# Patient Record
Sex: Female | Born: 1975 | Race: Black or African American | Hispanic: No | Marital: Married | Smoking: Never smoker
Health system: Southern US, Community
[De-identification: ages and names within clinical notes are randomized; demographics above are authoritative.]

## PROBLEM LIST (undated history)

## (undated) DIAGNOSIS — I10 Essential (primary) hypertension: Secondary | ICD-10-CM

## (undated) DIAGNOSIS — K219 Gastro-esophageal reflux disease without esophagitis: Secondary | ICD-10-CM

## (undated) DIAGNOSIS — D649 Anemia, unspecified: Secondary | ICD-10-CM

## (undated) DIAGNOSIS — F419 Anxiety disorder, unspecified: Secondary | ICD-10-CM

## (undated) HISTORY — DX: Essential (primary) hypertension: I10

## (undated) HISTORY — DX: Anxiety disorder, unspecified: F41.9

## (undated) HISTORY — PX: UPPER GASTROINTESTINAL ENDOSCOPY: SHX188

## (undated) HISTORY — PX: NO PAST SURGERIES: SHX2092

## (undated) HISTORY — DX: Anemia, unspecified: D64.9

---

## 1998-12-22 ENCOUNTER — Inpatient Hospital Stay (HOSPITAL_COMMUNITY): Admission: AD | Admit: 1998-12-22 | Discharge: 1998-12-22 | Payer: Self-pay | Admitting: Obstetrics

## 1998-12-22 ENCOUNTER — Emergency Department (HOSPITAL_COMMUNITY): Admission: EM | Admit: 1998-12-22 | Discharge: 1998-12-22 | Payer: Self-pay | Admitting: Emergency Medicine

## 1998-12-23 ENCOUNTER — Ambulatory Visit (HOSPITAL_COMMUNITY): Admission: RE | Admit: 1998-12-23 | Discharge: 1998-12-23 | Payer: Self-pay | Admitting: Obstetrics

## 1999-02-08 ENCOUNTER — Inpatient Hospital Stay (HOSPITAL_COMMUNITY): Admission: AD | Admit: 1999-02-08 | Discharge: 1999-02-08 | Payer: Self-pay | Admitting: *Deleted

## 1999-03-26 ENCOUNTER — Ambulatory Visit (HOSPITAL_COMMUNITY): Admission: RE | Admit: 1999-03-26 | Discharge: 1999-03-26 | Payer: Self-pay | Admitting: *Deleted

## 1999-05-05 ENCOUNTER — Other Ambulatory Visit: Admission: RE | Admit: 1999-05-05 | Discharge: 1999-05-05 | Payer: Self-pay | Admitting: *Deleted

## 1999-06-05 ENCOUNTER — Inpatient Hospital Stay (HOSPITAL_COMMUNITY): Admission: AD | Admit: 1999-06-05 | Discharge: 1999-06-05 | Payer: Self-pay | Admitting: Obstetrics & Gynecology

## 1999-07-03 ENCOUNTER — Ambulatory Visit (HOSPITAL_COMMUNITY): Admission: RE | Admit: 1999-07-03 | Discharge: 1999-07-03 | Payer: Self-pay | Admitting: *Deleted

## 1999-07-12 ENCOUNTER — Inpatient Hospital Stay (HOSPITAL_COMMUNITY): Admission: AD | Admit: 1999-07-12 | Discharge: 1999-07-12 | Payer: Self-pay | Admitting: *Deleted

## 1999-07-20 ENCOUNTER — Inpatient Hospital Stay (HOSPITAL_COMMUNITY): Admission: AD | Admit: 1999-07-20 | Discharge: 1999-07-20 | Payer: Self-pay | Admitting: Obstetrics

## 1999-07-21 ENCOUNTER — Inpatient Hospital Stay (HOSPITAL_COMMUNITY): Admission: AD | Admit: 1999-07-21 | Discharge: 1999-07-22 | Payer: Self-pay | Admitting: Obstetrics & Gynecology

## 2000-10-26 ENCOUNTER — Inpatient Hospital Stay (HOSPITAL_COMMUNITY): Admission: AD | Admit: 2000-10-26 | Discharge: 2000-10-26 | Payer: Self-pay | Admitting: Obstetrics & Gynecology

## 2000-12-10 ENCOUNTER — Inpatient Hospital Stay (HOSPITAL_COMMUNITY): Admission: AD | Admit: 2000-12-10 | Discharge: 2000-12-10 | Payer: Self-pay | Admitting: *Deleted

## 2000-12-30 ENCOUNTER — Inpatient Hospital Stay (HOSPITAL_COMMUNITY): Admission: AD | Admit: 2000-12-30 | Discharge: 2000-12-30 | Payer: Self-pay | Admitting: *Deleted

## 2001-01-28 ENCOUNTER — Ambulatory Visit (HOSPITAL_COMMUNITY): Admission: RE | Admit: 2001-01-28 | Discharge: 2001-01-28 | Payer: Self-pay | Admitting: *Deleted

## 2001-01-28 ENCOUNTER — Encounter: Payer: Self-pay | Admitting: *Deleted

## 2001-03-08 ENCOUNTER — Inpatient Hospital Stay (HOSPITAL_COMMUNITY): Admission: AD | Admit: 2001-03-08 | Discharge: 2001-03-08 | Payer: Self-pay | Admitting: *Deleted

## 2001-07-09 ENCOUNTER — Inpatient Hospital Stay (HOSPITAL_COMMUNITY): Admission: AD | Admit: 2001-07-09 | Discharge: 2001-07-11 | Payer: Self-pay | Admitting: Obstetrics and Gynecology

## 2002-01-23 ENCOUNTER — Emergency Department (HOSPITAL_COMMUNITY): Admission: EM | Admit: 2002-01-23 | Discharge: 2002-01-23 | Payer: Self-pay | Admitting: Emergency Medicine

## 2002-02-03 ENCOUNTER — Emergency Department (HOSPITAL_COMMUNITY): Admission: EM | Admit: 2002-02-03 | Discharge: 2002-02-03 | Payer: Self-pay | Admitting: Emergency Medicine

## 2002-03-13 ENCOUNTER — Emergency Department (HOSPITAL_COMMUNITY): Admission: EM | Admit: 2002-03-13 | Discharge: 2002-03-13 | Payer: Self-pay | Admitting: Emergency Medicine

## 2002-06-12 ENCOUNTER — Emergency Department (HOSPITAL_COMMUNITY): Admission: EM | Admit: 2002-06-12 | Discharge: 2002-06-12 | Payer: Self-pay | Admitting: Emergency Medicine

## 2002-07-30 ENCOUNTER — Inpatient Hospital Stay (HOSPITAL_COMMUNITY): Admission: AD | Admit: 2002-07-30 | Discharge: 2002-07-30 | Payer: Self-pay | Admitting: Family Medicine

## 2003-05-23 ENCOUNTER — Encounter (INDEPENDENT_AMBULATORY_CARE_PROVIDER_SITE_OTHER): Payer: Self-pay | Admitting: *Deleted

## 2003-05-23 LAB — CONVERTED CEMR LAB

## 2003-07-09 ENCOUNTER — Inpatient Hospital Stay (HOSPITAL_COMMUNITY): Admission: AD | Admit: 2003-07-09 | Discharge: 2003-07-09 | Payer: Self-pay | Admitting: Obstetrics & Gynecology

## 2003-07-09 ENCOUNTER — Encounter: Payer: Self-pay | Admitting: *Deleted

## 2003-07-31 ENCOUNTER — Other Ambulatory Visit: Admission: RE | Admit: 2003-07-31 | Discharge: 2003-07-31 | Payer: Self-pay | Admitting: Obstetrics and Gynecology

## 2003-12-11 ENCOUNTER — Emergency Department (HOSPITAL_COMMUNITY): Admission: EM | Admit: 2003-12-11 | Discharge: 2003-12-11 | Payer: Self-pay | Admitting: Emergency Medicine

## 2003-12-25 ENCOUNTER — Inpatient Hospital Stay (HOSPITAL_COMMUNITY): Admission: AD | Admit: 2003-12-25 | Discharge: 2003-12-25 | Payer: Self-pay | Admitting: Obstetrics and Gynecology

## 2004-01-18 ENCOUNTER — Encounter: Payer: Self-pay | Admitting: General Surgery

## 2004-01-18 ENCOUNTER — Inpatient Hospital Stay (HOSPITAL_COMMUNITY): Admission: AD | Admit: 2004-01-18 | Discharge: 2004-01-19 | Payer: Self-pay | Admitting: Obstetrics and Gynecology

## 2004-02-03 ENCOUNTER — Inpatient Hospital Stay (HOSPITAL_COMMUNITY): Admission: AD | Admit: 2004-02-03 | Discharge: 2004-02-05 | Payer: Self-pay | Admitting: Obstetrics and Gynecology

## 2004-03-06 ENCOUNTER — Other Ambulatory Visit: Admission: RE | Admit: 2004-03-06 | Discharge: 2004-03-06 | Payer: Self-pay | Admitting: Obstetrics and Gynecology

## 2004-10-27 ENCOUNTER — Emergency Department (HOSPITAL_COMMUNITY): Admission: EM | Admit: 2004-10-27 | Discharge: 2004-10-27 | Payer: Self-pay | Admitting: Emergency Medicine

## 2005-05-20 ENCOUNTER — Emergency Department (HOSPITAL_COMMUNITY): Admission: EM | Admit: 2005-05-20 | Discharge: 2005-05-20 | Payer: Self-pay | Admitting: Emergency Medicine

## 2005-06-18 ENCOUNTER — Ambulatory Visit: Payer: Self-pay | Admitting: Family Medicine

## 2005-07-28 ENCOUNTER — Ambulatory Visit: Payer: Self-pay | Admitting: Family Medicine

## 2005-07-30 ENCOUNTER — Ambulatory Visit: Payer: Self-pay | Admitting: Family Medicine

## 2005-09-09 ENCOUNTER — Inpatient Hospital Stay (HOSPITAL_COMMUNITY): Admission: AD | Admit: 2005-09-09 | Discharge: 2005-09-09 | Payer: Self-pay | Admitting: Obstetrics and Gynecology

## 2005-09-27 ENCOUNTER — Emergency Department (HOSPITAL_COMMUNITY): Admission: EM | Admit: 2005-09-27 | Discharge: 2005-09-28 | Payer: Self-pay | Admitting: Emergency Medicine

## 2006-11-03 ENCOUNTER — Emergency Department (HOSPITAL_COMMUNITY): Admission: EM | Admit: 2006-11-03 | Discharge: 2006-11-03 | Payer: Self-pay | Admitting: Family Medicine

## 2006-11-04 ENCOUNTER — Ambulatory Visit: Payer: Self-pay | Admitting: Sports Medicine

## 2006-11-18 ENCOUNTER — Encounter: Payer: Self-pay | Admitting: Family Medicine

## 2006-11-18 ENCOUNTER — Ambulatory Visit: Payer: Self-pay | Admitting: Sports Medicine

## 2006-11-18 DIAGNOSIS — E669 Obesity, unspecified: Secondary | ICD-10-CM | POA: Insufficient documentation

## 2006-11-18 DIAGNOSIS — I10 Essential (primary) hypertension: Secondary | ICD-10-CM | POA: Insufficient documentation

## 2006-11-18 LAB — CONVERTED CEMR LAB
BUN: 19 mg/dL (ref 6–23)
CO2: 27 meq/L (ref 19–32)
Calcium: 10.3 mg/dL (ref 8.4–10.5)
Chloride: 97 meq/L (ref 96–112)
Creatinine, Ser: 1.04 mg/dL (ref 0.40–1.20)
Glucose, Bld: 82 mg/dL (ref 70–99)
Potassium: 4.2 meq/L (ref 3.5–5.3)
Sodium: 137 meq/L (ref 135–145)

## 2006-11-19 ENCOUNTER — Encounter (INDEPENDENT_AMBULATORY_CARE_PROVIDER_SITE_OTHER): Payer: Self-pay | Admitting: *Deleted

## 2006-12-16 ENCOUNTER — Emergency Department (HOSPITAL_COMMUNITY): Admission: EM | Admit: 2006-12-16 | Discharge: 2006-12-16 | Payer: Self-pay | Admitting: Emergency Medicine

## 2006-12-18 ENCOUNTER — Emergency Department (HOSPITAL_COMMUNITY): Admission: EM | Admit: 2006-12-18 | Discharge: 2006-12-18 | Payer: Self-pay | Admitting: Emergency Medicine

## 2006-12-28 ENCOUNTER — Inpatient Hospital Stay (HOSPITAL_COMMUNITY): Admission: AD | Admit: 2006-12-28 | Discharge: 2006-12-28 | Payer: Self-pay | Admitting: Obstetrics and Gynecology

## 2007-02-18 ENCOUNTER — Telehealth (INDEPENDENT_AMBULATORY_CARE_PROVIDER_SITE_OTHER): Payer: Self-pay | Admitting: *Deleted

## 2007-05-02 ENCOUNTER — Telehealth (INDEPENDENT_AMBULATORY_CARE_PROVIDER_SITE_OTHER): Payer: Self-pay | Admitting: *Deleted

## 2007-06-03 ENCOUNTER — Other Ambulatory Visit: Admission: RE | Admit: 2007-06-03 | Discharge: 2007-06-03 | Payer: Self-pay | Admitting: *Deleted

## 2007-06-03 ENCOUNTER — Ambulatory Visit: Payer: Self-pay | Admitting: Family Medicine

## 2007-06-03 ENCOUNTER — Encounter (INDEPENDENT_AMBULATORY_CARE_PROVIDER_SITE_OTHER): Payer: Self-pay | Admitting: *Deleted

## 2007-06-03 LAB — CONVERTED CEMR LAB
Chlamydia, DNA Probe: NEGATIVE
GC Probe Amp, Genital: NEGATIVE
TSH: 5.227 microintl units/mL (ref 0.350–5.50)

## 2007-06-07 ENCOUNTER — Encounter (INDEPENDENT_AMBULATORY_CARE_PROVIDER_SITE_OTHER): Payer: Self-pay | Admitting: *Deleted

## 2007-06-14 ENCOUNTER — Encounter (INDEPENDENT_AMBULATORY_CARE_PROVIDER_SITE_OTHER): Payer: Self-pay | Admitting: *Deleted

## 2007-10-27 ENCOUNTER — Emergency Department (HOSPITAL_COMMUNITY): Admission: EM | Admit: 2007-10-27 | Discharge: 2007-10-27 | Payer: Self-pay | Admitting: Emergency Medicine

## 2008-01-23 ENCOUNTER — Emergency Department (HOSPITAL_COMMUNITY): Admission: EM | Admit: 2008-01-23 | Discharge: 2008-01-23 | Payer: Self-pay | Admitting: Emergency Medicine

## 2008-06-19 ENCOUNTER — Emergency Department (HOSPITAL_COMMUNITY): Admission: EM | Admit: 2008-06-19 | Discharge: 2008-06-19 | Payer: Self-pay | Admitting: Emergency Medicine

## 2008-07-27 ENCOUNTER — Encounter: Payer: Self-pay | Admitting: *Deleted

## 2008-08-23 ENCOUNTER — Encounter: Payer: Self-pay | Admitting: *Deleted

## 2008-08-28 ENCOUNTER — Telehealth: Payer: Self-pay | Admitting: *Deleted

## 2008-09-04 ENCOUNTER — Ambulatory Visit: Payer: Self-pay | Admitting: Family Medicine

## 2008-09-04 ENCOUNTER — Telehealth: Payer: Self-pay | Admitting: *Deleted

## 2008-09-04 ENCOUNTER — Encounter: Payer: Self-pay | Admitting: Family Medicine

## 2008-09-04 DIAGNOSIS — K219 Gastro-esophageal reflux disease without esophagitis: Secondary | ICD-10-CM | POA: Insufficient documentation

## 2008-09-04 LAB — CONVERTED CEMR LAB
ALT: 14 units/L (ref 0–35)
AST: 13 units/L (ref 0–37)
Albumin: 4.1 g/dL (ref 3.5–5.2)
Alkaline Phosphatase: 44 units/L (ref 39–117)
BUN: 17 mg/dL (ref 6–23)
Basophils Absolute: 0.1 10*3/uL (ref 0.0–0.1)
Basophils Relative: 1 % (ref 0–1)
CO2: 23 meq/L (ref 19–32)
Calcium: 9.3 mg/dL (ref 8.4–10.5)
Chloride: 105 meq/L (ref 96–112)
Cholesterol: 115 mg/dL (ref 0–200)
Creatinine, Ser: 0.83 mg/dL (ref 0.40–1.20)
Eosinophils Absolute: 0.1 10*3/uL (ref 0.0–0.7)
Eosinophils Relative: 2 % (ref 0–5)
Glucose, Bld: 101 mg/dL — ABNORMAL HIGH (ref 70–99)
H Pylori IgG: NEGATIVE
HCT: 32.2 % — ABNORMAL LOW (ref 36.0–46.0)
HDL: 44 mg/dL (ref >39)
Hemoglobin: 10.3 g/dL — ABNORMAL LOW (ref 12.0–15.0)
LDL Cholesterol: 62 mg/dL (ref 0–99)
Lymphocytes Relative: 40 % (ref 12–46)
Lymphs Abs: 2 10*3/uL (ref 0.7–4.0)
MCHC: 32 g/dL (ref 30.0–36.0)
MCV: 89.9 fL (ref 78.0–100.0)
Monocytes Absolute: 0.3 10*3/uL (ref 0.1–1.0)
Monocytes Relative: 6 % (ref 3–12)
Neutro Abs: 2.6 10*3/uL (ref 1.7–7.7)
Neutrophils Relative %: 51 % (ref 43–77)
Platelets: 232 10*3/uL (ref 150–400)
Potassium: 3.6 meq/L (ref 3.5–5.3)
RBC: 3.58 M/uL — ABNORMAL LOW (ref 3.87–5.11)
RDW: 13.4 % (ref 11.5–15.5)
Sodium: 139 meq/L (ref 135–145)
Total Bilirubin: 0.3 mg/dL (ref 0.3–1.2)
Total CHOL/HDL Ratio: 2.6
Total Protein: 7.2 g/dL (ref 6.0–8.3)
Triglycerides: 47 mg/dL (ref <150)
VLDL: 9 mg/dL (ref 0–40)
WBC: 5.1 10*3/uL (ref 4.0–10.5)

## 2008-09-07 ENCOUNTER — Telehealth: Payer: Self-pay | Admitting: Family Medicine

## 2008-09-07 DIAGNOSIS — D649 Anemia, unspecified: Secondary | ICD-10-CM

## 2008-09-07 HISTORY — DX: Anemia, unspecified: D64.9

## 2009-06-18 ENCOUNTER — Emergency Department (HOSPITAL_COMMUNITY): Admission: EM | Admit: 2009-06-18 | Discharge: 2009-06-18 | Payer: Self-pay | Admitting: Emergency Medicine

## 2009-10-23 ENCOUNTER — Emergency Department (HOSPITAL_COMMUNITY): Admission: EM | Admit: 2009-10-23 | Discharge: 2009-10-23 | Payer: Self-pay | Admitting: Emergency Medicine

## 2010-01-20 ENCOUNTER — Ambulatory Visit: Payer: Self-pay | Admitting: Family Medicine

## 2010-01-20 ENCOUNTER — Encounter: Payer: Self-pay | Admitting: Family Medicine

## 2010-01-21 ENCOUNTER — Telehealth: Payer: Self-pay | Admitting: Family Medicine

## 2010-01-23 ENCOUNTER — Telehealth: Payer: Self-pay | Admitting: Sports Medicine

## 2010-01-24 ENCOUNTER — Ambulatory Visit: Payer: Self-pay | Admitting: Family Medicine

## 2010-02-19 ENCOUNTER — Inpatient Hospital Stay (HOSPITAL_COMMUNITY): Admission: AD | Admit: 2010-02-19 | Discharge: 2010-02-19 | Payer: Self-pay | Admitting: Obstetrics & Gynecology

## 2010-03-13 ENCOUNTER — Inpatient Hospital Stay (HOSPITAL_COMMUNITY): Admission: AD | Admit: 2010-03-13 | Discharge: 2010-03-13 | Payer: Self-pay | Admitting: Obstetrics & Gynecology

## 2010-03-13 ENCOUNTER — Ambulatory Visit: Payer: Self-pay | Admitting: Gynecology

## 2010-03-27 ENCOUNTER — Telehealth (INDEPENDENT_AMBULATORY_CARE_PROVIDER_SITE_OTHER): Payer: Self-pay | Admitting: *Deleted

## 2010-04-07 ENCOUNTER — Ambulatory Visit: Payer: Self-pay | Admitting: Family Medicine

## 2010-04-07 ENCOUNTER — Encounter: Payer: Self-pay | Admitting: Family Medicine

## 2010-04-07 LAB — CONVERTED CEMR LAB
ALT: 10 units/L (ref 0–35)
AST: 13 units/L (ref 0–37)
Albumin: 4.2 g/dL (ref 3.5–5.2)
Alkaline Phosphatase: 51 units/L (ref 39–117)
BUN: 12 mg/dL (ref 6–23)
CO2: 25 meq/L (ref 19–32)
Calcium: 10 mg/dL (ref 8.4–10.5)
Chloride: 99 meq/L (ref 96–112)
Creatinine, Ser: 0.92 mg/dL (ref 0.40–1.20)
Glucose, Bld: 82 mg/dL (ref 70–99)
HCT: 35.6 % — ABNORMAL LOW (ref 36.0–46.0)
Hemoglobin: 11.5 g/dL — ABNORMAL LOW (ref 12.0–15.0)
MCHC: 32.3 g/dL (ref 30.0–36.0)
MCV: 89.2 fL (ref 78.0–100.0)
Platelets: 291 10*3/uL (ref 150–400)
Potassium: 3.5 meq/L (ref 3.5–5.3)
RBC: 3.99 M/uL (ref 3.87–5.11)
RDW: 14.5 % (ref 11.5–15.5)
Sodium: 139 meq/L (ref 135–145)
Total Bilirubin: 0.4 mg/dL (ref 0.3–1.2)
Total Protein: 8.2 g/dL (ref 6.0–8.3)
WBC: 7.1 10*3/uL (ref 4.0–10.5)

## 2010-04-10 ENCOUNTER — Encounter: Payer: Self-pay | Admitting: Family Medicine

## 2010-04-29 ENCOUNTER — Emergency Department (HOSPITAL_COMMUNITY): Admission: EM | Admit: 2010-04-29 | Discharge: 2010-04-29 | Payer: Self-pay | Admitting: Family Medicine

## 2010-09-21 NOTE — L&D Delivery Note (Signed)
Delivery Note At 8:01 AM a viable female was delivered via Vaginal, Spontaneous Delivery, loose nuchal cord X 2. reduced  ).  APGAR: 8, 9; weight 6 lb 13.3 oz (3099 g).   Placenta status: Intact, Spontaneous.  Cord: 3 vessels with the following complications: None.  Cord pH:   Anesthesia: None  Episiotomy: None Lacerations:  Suture Repair:  Est. Blood Loss <500(mL):   Mom to postpartum.  Baby to nursery-stable.  CRESENZO-DISHMAN,Aiza Vollrath 04/17/2011, 8:51 AM

## 2010-09-23 ENCOUNTER — Emergency Department (HOSPITAL_COMMUNITY)
Admission: EM | Admit: 2010-09-23 | Discharge: 2010-09-23 | Payer: Self-pay | Source: Home / Self Care | Admitting: Family Medicine

## 2010-09-29 ENCOUNTER — Inpatient Hospital Stay (HOSPITAL_COMMUNITY)
Admission: AD | Admit: 2010-09-29 | Discharge: 2010-09-29 | Payer: Self-pay | Source: Home / Self Care | Attending: Obstetrics and Gynecology | Admitting: Obstetrics and Gynecology

## 2010-10-06 LAB — ABO/RH: ABO/RH(D): O POS

## 2010-10-06 LAB — WET PREP, GENITAL
Trich, Wet Prep: NONE SEEN
Yeast Wet Prep HPF POC: NONE SEEN

## 2010-10-06 LAB — CBC
HCT: 33.2 % — ABNORMAL LOW (ref 36.0–46.0)
Hemoglobin: 11.1 g/dL — ABNORMAL LOW (ref 12.0–15.0)
MCH: 28.8 pg (ref 26.0–34.0)
MCHC: 33.4 g/dL (ref 30.0–36.0)
MCV: 86 fL (ref 78.0–100.0)
Platelets: 225 10*3/uL (ref 150–400)
RBC: 3.86 MIL/uL — ABNORMAL LOW (ref 3.87–5.11)
RDW: 13.6 % (ref 11.5–15.5)
WBC: 5.4 10*3/uL (ref 4.0–10.5)

## 2010-10-06 LAB — HCG, QUANTITATIVE, PREGNANCY: hCG, Beta Chain, Quant, S: 96573 m[IU]/mL — ABNORMAL HIGH (ref <5)

## 2010-10-06 LAB — GC/CHLAMYDIA PROBE AMP, GENITAL
Chlamydia, DNA Probe: NEGATIVE
GC Probe Amp, Genital: NEGATIVE

## 2010-10-21 NOTE — Progress Notes (Signed)
Summary: Rx Prob  Phone Note Call from Patient Call back at [phone]   Caller: Patient Summary of Call: Pt says that she was trying to get her bp meds filled, but the pharmacy is out and it will be about a week before they get any.  Is there something else that can be sent in for her?  Pharmacy is [facility] [address] Initial call taken by: Clydell Hakim,  March 27, 2010 4:00 PM  Follow-up for Phone Call        spoke with patient and  then pharmacy. It is the generic  maxizide that pharmacy is out of.  pharmacist states it has been back ordered for a month or so.  patient has been out of this for a week . will send message to MD to please advise. Follow-up by: Theresia Lo RN,  March 27, 2010 4:57 PM  Additional Follow-up for Phone Call Additional follow up Details #1::        Dr. Earnest Bailey paged and she will address the  medication problem . Additional Follow-up by: Theresia Lo RN,  March 27, 2010 5:22 PM    Additional Follow-up for Phone Call Additional follow up Details #2::    I have sent prescription.  Patient overdue for follow-up appt.  Please make follow-up. Follow-up by: Delbert Harness MD,  March 27, 2010 10:19 PM  Additional Follow-up for Phone Call Additional follow up Details #3:: Details for Additional Follow-up Action Taken: patient notified and  apppointment is scheduled forn 04/07/2010 at 4:00 PM.   New/Updated Medications: HYDROCHLOROTHIAZIDE 25 MG TABS (HYDROCHLOROTHIAZIDE) take one tablet daily (use until maxide available) Prescriptions: HYDROCHLOROTHIAZIDE 25 MG TABS (HYDROCHLOROTHIAZIDE) take one tablet daily (use until maxide available)  #30 x 0   Entered and Authorized by:   Delbert Harness MD   Signed by:   Delbert Harness MD on 03/27/2010   Method used:   Electronically to        [facility] [phone]* (retail)       [address]       [place], [place]  [address]       Ph: [phone]       Fax: [phone]   RxID:   [id number]

## 2010-10-21 NOTE — Progress Notes (Signed)
Summary: Rx Req  Phone Note Refill Request Call back at [phone] Message from:  Patient  Refills Requested: Medication #1:  NORVASC 5 MG TABS Take 1 tablet by mouth once a day.  Pt needs appointment  Medication #2:  MAXZIDE-25 37.5-25 MG TABS Take 1 tablet by mouth once a day [facility]  Initial call taken by: Clydell Hakim,  Jan 21, 2010 11:38 AM  Follow-up for Phone Call        to pcp Follow-up by: Theresia Lo RN,  Jan 21, 2010 12:00 PM    Prescriptions: NORVASC 5 MG TABS (AMLODIPINE BESYLATE) Take 1 tablet by mouth once a day.  Pt needs appointment  #30 x 3   Entered and Authorized by:   Angelena Sole MD   Signed by:   Angelena Sole MD on 01/21/2010   Method used:   Electronically to        [facility] [phone]* (retail)       [address]       [place], [place]  [address]       Ph: [phone]       Fax: [phone]   RxID:   [id number] MAXZIDE-25 37.5-25 MG TABS (TRIAMTERENE-HCTZ) Take 1 tablet by mouth once a day  #30 x 3   Entered and Authorized by:   Angelena Sole MD   Signed by:   Angelena Sole MD on 01/21/2010   Method used:   Electronically to        [facility] [phone]* (retail)       [address]       [place], [place]  [address]       Ph: [phone]       Fax: [phone]   RxID:   [id number]

## 2010-10-21 NOTE — Miscellaneous (Signed)
Summary: Consent IUD Removal  Consent IUD Removal   Imported By: Clydell Hakim 01/29/2010 13:52:35  _____________________________________________________________________  External Attachment:    Type:   Image     Comment:   External Document

## 2010-10-21 NOTE — Progress Notes (Signed)
Summary: triage  Phone Note Call from Patient Call back at [phone]   Caller: Patient Summary of Call: pt states that her BP meds is not working - she is having Headaches and bp is still high Initial call taken by: De Nurse,  Jan 23, 2010 2:21 PM  Follow-up for Phone Call        LM Follow-up by: Golden Circle RN,  Jan 23, 2010 2:34 PM  Additional Follow-up for Phone Call Additional follow up Details #1::        pt returned call Additional Follow-up by: De Nurse,  Jan 23, 2010 2:37 PM    Additional Follow-up for Phone Call Additional follow up Details #2::    has been out of bp meds x 3 wks or more. just restarted them. c/o elevated readings 152/111, 168/118 & a HA that will not quit. tylenol did not help. asa helped some. "trying to stay away from salt" asked her to come in now so we can check her bp. if elevated by our machine will see her. she agreed with this plan Follow-up by: Golden Circle RN,  Jan 23, 2010 2:45 PM  Additional Follow-up for Phone Call Additional follow up Details #3:: Details for Additional Follow-up Action Taken: 138/108. states she has been very nervous about this. does not want to come in now. advised low sodium foods & relax. she does not work. told her we have a doctor on call when we are closed. to continue asa if it helps the HA. she said she would call us back if it got worse. Additional Follow-up by: Golden Circle RN,  Jan 23, 2010 3:24 PM   Good, should be seen tomo for this. Rodney Langton MD  Jan 23, 2010 4:19 PM   she will be here between 10 & 10:30 this am. states she feels a lot better.Golden Circle RN  Jan 24, 2010 8:35 AM  Noted. Rodney Langton MD  Jan 24, 2010 9:49 AM

## 2010-10-21 NOTE — Letter (Signed)
Summary: Results Follow-up Letter  [facility]  [address]   [place], [place] [address]   Phone: [phone]  Fax: [phone]    04/10/2010  [address] [place], [place]  [address]  Dear Tamara Church,   The following are the results of your recent test(s):  Your potassium level looked fine today.  Your hemoglobin is mildy low showing you have some anemia.  Please take iron tablets (ferrous sulfate 325 mg) twice a day and we will recheck and discuss at your next visit.   Sincerely,  Delbert Harness MD [facility]           Appended Document: Results Follow-up Letter mailed

## 2010-10-21 NOTE — Assessment & Plan Note (Signed)
Summary: removal of iud/bp ck,tcb   Vital Signs:  Patient profile:   35 year old female Weight:      200.6 pounds BMI:     35.66 Temp:     98.3 degrees F oral Pulse rate:   82 / minute Pulse rhythm:   regular BP sitting:   155 / 109  (right arm) Cuff size:   large  Vitals Entered By: Loralee Pacas CMA (Jan 20, 2010 1:40 PM)  Serial Vital Signs/Assessments:  Time      Position  BP       Pulse  Resp  Temp     By 1:59 PM             158/118                        Loralee Pacas CMA  CC: blood pressure  Comments pt ran out of her bp meds and was told that she could not get refill until she has been seen   Primary Care Provider:  Delbert Harness MD  CC:  blood pressure .  History of Present Illness: HTN- out of meds >3 weeks. denies chest pain, SOB, peripheral edema, blurred vision. occasional headache. previously on triamterene-hctz and amlodipine.   family planning- has "plastic IUD." was placed 6 years ago (per patient). desires removal.   Current Medications (verified): 1)  None  Allergies (verified): No Known Drug Allergies  Past History:  Past Medical History: G5P5005 all FT all vaginal, IUD placed May 2005, No history of abnormal paps or STDs  Physical Exam  General:  obese female, NAD. vitals reviewed.  Eyes:  vision grossly normal Mouth:  MMM Neck:  No deformities, masses, or tenderness noted. no JVD Lungs:  Normal respiratory effort, chest expands symmetrically. Lungs are clear to auscultation, no crackles or wheezes. Heart:  Normal rate and regular rhythm. S1 and S2 normal without gallop, murmur, click, rub or other extra sounds. Abdomen:  obese NT, ND, +BS Genitalia:  Normal introitus for age, no external lesions, no vaginal discharge, mucosa pink and moist, no vaginal or cervical lesions, no vaginal atrophy, no friaility or hemorrhage, normal uterus size and position, no adnexal masses or tenderness.  IUD REMOVAL Consent signed. IUD strings visualized and  grasped with ring forceps. Gentle traction was applied. IUD removed without difficulty.  Pulses:  R and L carotid,radial,dorsalis pedis and posterior tibial pulses are full and equal bilaterally Extremities:  no edema of BLE Neurologic:  alert & oriented X3 and cranial nerves II-XII intact.     Impression & Recommendations:  Problem # 1:  CONTRACEPTIVE MANAGEMENT (ICD-V25.09) Assessment Unchanged IUD removed. patient declined depo at this time, but plans on resuming in future. not good candidate for estrogen containing forms of birth control given uncontrolled HTN  Problem # 2:  HYPERTENSION, BENIGN SYSTEMIC (ICD-401.1) Assessment: Deteriorated  refills for medications sent. f/u in 3-4 weeks via phone with home BP measurements. patient to check 2x weekly.  Her updated medication list for this problem includes:    Maxzide-25 37.5-25 Mg Tabs (Triamterene-hctz) .Marland Kitchen... Take 1 tablet by mouth once a day    Norvasc 5 Mg Tabs (Amlodipine besylate) .Marland Kitchen... Take 1 tablet by mouth once a day.  pt needs appointment  Orders: [facility]- Est  Level 4 ([address])  Other Orders: IUD removal -[facility] ([address])   Prevention & Chronic Care Immunizations   Influenza vaccine: Not documented    Tetanus booster: 09/04/2008: Tdap  Pneumococcal vaccine: Not documented  Other Screening   Pap smear: Done.  (05/23/2003)   Pap smear due: 05/22/2004   Smoking status: never  (09/04/2008)  Hypertension   Last Blood Pressure: 155 / 109  (01/20/2010)   Serum creatinine: 0.83  (09/04/2008)   Serum potassium 3.6  (09/04/2008)    Hypertension flowsheet reviewed?: Yes   Progress toward BP goal: Deteriorated  Self-Management Support :   Personal Goals (by the next clinic visit) :      Personal blood pressure goal: 140/90  (01/20/2010)   Patient will work on the following items until the next clinic visit to reach self-care goals:     Medications and monitoring: take my medicines every day, check my blood pressure   (01/20/2010)     Eating: eat foods that are low in salt  (01/20/2010)     Activity: take a 30 minute walk every day  (01/20/2010)    Hypertension self-management support: BP self-monitoring log, Written self-care plan  (01/20/2010)   Hypertension self-care plan printed.

## 2010-10-21 NOTE — Assessment & Plan Note (Signed)
Summary: bp check-see notes//Tamara Church   Vital Signs:  Patient profile:   35 year old female Height:      63 inches Weight:      197.4 pounds BMI:     35.09 Temp:     98.6 degrees F Pulse rate:   98 / minute BP sitting:   132 / 102  (left arm)  Vitals Entered By: Theresia Lo RN (Jan 24, 2010 10:25 AM)  Serial Vital Signs/Assessments:  Comments: 10:34 AM BP checked manually using large adult cuff RA also 130/100 By: Theresia Lo RN   CC: elevated BP see notes Is Patient Diabetic? No   Primary Care Provider:  Delbert Harness MD  CC:  elevated BP see notes.  History of Present Illness: 35 yo female with elevated BP noted on last visit.  Had been out of BP meds for at least 3 weeks and was having daily headaches.  Today has been back on BP meds (confirmed with medlist today) and is no longer having headaches.  Also denies chest pain, dyspnea, LE edema, focal weakness, numbness.  Habits & Providers  Alcohol-Tobacco-Diet     Tobacco Status: never  Current Medications (verified): 1)  Maxzide-25 37.5-25 Mg Tabs (Triamterene-Hctz) .... Take 1 Tablet By Mouth Once A Day 2)  Norvasc 5 Mg Tabs (Amlodipine Besylate) .... Take 1 Tablet By Mouth Once A Day.  Pt Needs Appointment 3)  Omeprazole 40 Mg Cpdr (Omeprazole) .... Take One Tablet Daily  Allergies (verified): No Known Drug Allergies  Review of Systems       Per HPI.  Physical Exam  Additional Exam:  VITALS:  Reviewed, hypertensive GEN: Alert & oriented, no acute distress NECK: Midline trachea, no masses/thyromegaly, no cervical lymphadenopathy CARDIO: Regular rate and rhythm, no murmurs/rubs/gallops, 2+ bilateral radial pulses RESP: Clear to auscultation, normal work of breathing, no retractions/accessory muscle use EXT: Nontender, no edema    Impression & Recommendations:  Problem # 1:  HYPERTENSION, BENIGN SYSTEMIC (ICD-401.1) Assessment Deteriorated Medication nonadherence.  BP coming down now that on BP  meds, but still elevated DBP.  Recheck in nurse clinic next week, f/u w/ Dr. Karie Schwalbe in 2 weeks (PCP is on maternity leave).  Would consider increasing Norvasc to 10 mg if still not at goal.  Discussed exercise and low salt diet today.  If she is able to implement these, would also discuss DASH diet with patient. Her updated medication list for this problem includes:    Maxzide-25 37.5-25 Mg Tabs (Triamterene-hctz) .Marland Kitchen... Take 1 tablet by mouth once a day    Norvasc 5 Mg Tabs (Amlodipine besylate) .Marland Kitchen... Take 1 tablet by mouth once a day.  pt needs appointment  Orders: [facility]- Est Level  3 ([id number])  BP today: 132/102 Prior BP: 155/109 (01/20/2010)  Labs Reviewed: K+: 3.6 (09/04/2008) Creat: : 0.83 (09/04/2008)   Chol: 115 (09/04/2008)   HDL: 44 (09/04/2008)   LDL: 62 (09/04/2008)   TG: 47 (09/04/2008)  Complete Medication List: 1)  Maxzide-25 37.5-25 Mg Tabs (Triamterene-hctz) .... Take 1 tablet by mouth once a day 2)  Norvasc 5 Mg Tabs (Amlodipine besylate) .... Take 1 tablet by mouth once a day.  pt needs appointment 3)  Omeprazole 40 Mg Cpdr (Omeprazole) .... Take one tablet daily  Patient Instructions: 1)  Pleasure to meet you today. 2)  High blood pressure puts you at risk for stroke, kidney disease, and heart disease. 3)  Exercise 30 minutes, 5 days per week. 4)  Watch the salt in  your diet--see attached handout. 5)  Keep taking your blood pressure medicines. 6)  Please schedule a follow-up appointment in 2 weeks with Dr. Benjamin Stain for high blood pressure follow-up. 7)  Please schedule a NURSE VISIT in 1 week to check blood pressure.

## 2010-10-21 NOTE — Assessment & Plan Note (Signed)
Summary: BP follow up/ls   Vital Signs:  Patient profile:   35 year old female Weight:      196 pounds Temp:     98.1 degrees F oral Pulse rate:   76 / minute Pulse rhythm:   regular BP sitting:   122 / 81  (left arm) Cuff size:   large  Vitals Entered By: Loralee Pacas CMA (April 07, 2010 3:46 PM) CC: blood pressure   Primary Care Provider:  Delbert Harness MD  CC:  blood pressure.  History of Present Illness: 35 yo HTN here for BP check  HYPERTENSION Meds: Taking and tolerating? Recently switched from triamteren/hctz to HCTZ due to low supply and inability to get meds.  Currently on HCTZ 25 and Amlodipine 5 with good BP control. Home BP's: no Chest Pain: no Dyspnea: no     Current Medications (verified): 1)  Maxzide-25 37.5-25 Mg Tabs (Triamterene-Hctz) .... Take 1 Tablet By Mouth Once A Day 2)  Norvasc 5 Mg Tabs (Amlodipine Besylate) .... Take 1 Tablet By Mouth Once A Day.  Pt Needs Appointment 3)  Omeprazole 40 Mg Cpdr (Omeprazole) .... Take One Tablet Daily 4)  Hydrochlorothiazide 25 Mg Tabs (Hydrochlorothiazide) .... Take One Tablet Daily (Use Until Maxide Available)  Allergies (verified): No Known Drug Allergies PMH-FH-SH reviewed-no changes except otherwise noted  Review of Systems      See HPI General:  Denies fatigue and weight loss. CV:  Denies chest pain or discomfort, fatigue, lightheadness, and shortness of breath with exertion. Resp:  Denies shortness of breath.  Physical Exam  General:  Well-developed,well-nourished,in no acute distress; alert,appropriate and cooperative throughout examination Lungs:  Normal respiratory effort, chest expands symmetrically. Lungs are clear to auscultation, no crackles or wheezes. Heart:  Normal rate and regular rhythm. S1 and S2 normal without gallop, murmur, click, rub or other extra sounds. Extremities:  no LE edema   Impression & Recommendations:  Problem # 1:  HYPERTENSION, BENIGN SYSTEMIC (ICD-401.1) Will  check K.  Per patient she has a history of low K on HCTZ alone.  Will check K today. If ok, will keep same regimen.  If low, will d/c hctz to prevent from taking K supplement and increase amlodipine.  She asked me to call her cell as noted in instructions.  Her updated medication list for this problem includes:    Maxzide-25 37.5-25 Mg Tabs (Triamterene-hctz) .Marland Kitchen... Take 1 tablet by mouth once a day    Norvasc 5 Mg Tabs (Amlodipine besylate) .Marland Kitchen... Take 1 tablet by mouth once a day.  pt needs appointment    Hydrochlorothiazide 25 Mg Tabs (Hydrochlorothiazide) .Marland Kitchen... Take one tablet daily (use until maxide available)  Orders: Comp Met-[facility] ([id number]) [facility]- Est Level  3 ([id number])  Problem # 2:  OBESITY, NOS (ICD-278.00) Given info on healthy nutrition.  Will adress further at next visit.  Orders: Comp [facility] [phone])  Ht: 63 (01/24/2010)   Wt: 196 (04/07/2010)   BMI: 35.09 (01/24/2010)  Problem # 3:  Preventive Health Care (ICD-V70.0) not currently insured but states she is working on [employer].  She is overdue for pap and needs to further discuss contraception.  Complete Medication List: 1)  Maxzide-25 37.5-25 Mg Tabs (Triamterene-hctz) .... Take 1 tablet by mouth once a day 2)  Norvasc 5 Mg Tabs (Amlodipine besylate) .... Take 1 tablet by mouth once a day.  pt needs appointment 3)  Omeprazole 40 Mg Cpdr (Omeprazole) .... Take one tablet daily 4)  Hydrochlorothiazide 25 Mg Tabs (Hydrochlorothiazide) .Marland KitchenMarland KitchenMarland Kitchen  Take one tablet daily (use until maxide available)  Other Orders: [facility] ([address])  Patient Instructions: 1)  WIll check blood work today.  I will call you at [phone] to discuss your lab work and if we need to change medicines. 2)  Take a daily multivitamin (any brand is ok) 3)  Consider birth control 4)  See handout on things you can do to help control your blood pressure 5)  You are due for your annual gynecological exam and pap smear 6)  We need to get fasting  bloodwork (cholesterol fasting sugar)   Prevention & Chronic Care Immunizations   Influenza vaccine: Not documented    Tetanus booster: 09/04/2008: Tdap    Pneumococcal vaccine: Not documented  Other Screening   Pap smear: Done.  (05/23/2003)   Pap smear due: 05/22/2004   Smoking status: never  (01/24/2010)  Hypertension   Last Blood Pressure: 122 / 81  (04/07/2010)   Serum creatinine: 0.83  (09/04/2008)   Serum potassium 3.6  (09/04/2008) CMP ordered     Hypertension flowsheet reviewed?: Yes   Progress toward BP goal: At goal  Self-Management Support :   Personal Goals (by the next clinic visit) :      Personal blood pressure goal: 140/90  (01/20/2010)   Patient will work on the following items until the next clinic visit to reach self-care goals:     Medications and monitoring: take my medicines every day, check my blood pressure  (04/07/2010)     Eating: drink diet soda or water instead of juice or soda, eat more vegetables, eat foods that are low in salt, eat baked foods instead of fried foods, limit or avoid alcohol  (04/07/2010)     Activity: take a 30 minute walk every day  (04/07/2010)    Hypertension self-management support: BP self-monitoring log, Written self-care plan  (04/07/2010)   Hypertension self-care plan printed.

## 2010-10-27 ENCOUNTER — Telehealth: Payer: Self-pay | Admitting: *Deleted

## 2010-10-28 ENCOUNTER — Other Ambulatory Visit: Payer: Self-pay

## 2010-10-29 ENCOUNTER — Encounter: Payer: Self-pay | Admitting: Family Medicine

## 2010-10-30 ENCOUNTER — Other Ambulatory Visit: Payer: Self-pay

## 2010-10-30 ENCOUNTER — Other Ambulatory Visit: Payer: Self-pay | Admitting: Family Medicine

## 2010-10-30 DIAGNOSIS — Z3480 Encounter for supervision of other normal pregnancy, unspecified trimester: Secondary | ICD-10-CM

## 2010-10-31 LAB — HIV ANTIBODY (ROUTINE TESTING W REFLEX): HIV: NONREACTIVE

## 2010-10-31 LAB — OBSTETRIC PANEL
Antibody Screen: NEGATIVE
Basophils Absolute: 0 10*3/uL (ref 0.0–0.1)
Basophils Relative: 0 % (ref 0–1)
Eosinophils Absolute: 0.1 10*3/uL (ref 0.0–0.7)
Eosinophils Relative: 1 % (ref 0–5)
HCT: 32.1 % — ABNORMAL LOW (ref 36.0–46.0)
Hemoglobin: 10.5 g/dL — ABNORMAL LOW (ref 12.0–15.0)
Hepatitis B Surface Ag: NEGATIVE
Lymphocytes Relative: 22 % (ref 12–46)
Lymphs Abs: 1.6 10*3/uL (ref 0.7–4.0)
MCH: 28.4 pg (ref 26.0–34.0)
MCHC: 32.7 g/dL (ref 30.0–36.0)
MCV: 86.8 fL (ref 78.0–100.0)
Monocytes Absolute: 0.5 10*3/uL (ref 0.1–1.0)
Monocytes Relative: 7 % (ref 3–12)
Neutro Abs: 5 10*3/uL (ref 1.7–7.7)
Neutrophils Relative %: 70 % (ref 43–77)
Platelets: 212 10*3/uL (ref 150–400)
RBC: 3.7 MIL/uL — ABNORMAL LOW (ref 3.87–5.11)
RDW: 13.3 % (ref 11.5–15.5)
Rh Type: POSITIVE
Rubella: 7.5 IU/mL — ABNORMAL HIGH
WBC: 7.2 10*3/uL (ref 4.0–10.5)

## 2010-10-31 LAB — SICKLE CELL SCREEN: Sickle Cell Screen: NEGATIVE

## 2010-11-01 LAB — CULTURE, OB URINE
Colony Count: NO GROWTH
Organism ID, Bacteria: NO GROWTH

## 2010-11-03 ENCOUNTER — Encounter: Payer: Self-pay | Admitting: *Deleted

## 2010-11-06 NOTE — Progress Notes (Signed)
Summary: NOB APPTS  Phone Note Call from Patient   Caller: Patient Call For: [phone] Summary of Call: Tamara Church has just found out she is expecting.  Will need to be assigned to someone who will be able to follow her prenatal care.  Please let me know who to assign her to and I will make both lab and NOB appts.  Appts scheduled:  Lab 10/30/10 @ 11am                                Dr. Tye Savoy 11/07/10 @ 3pm Initial call taken by: Abundio Miu,  October 27, 2010 3:13 PM

## 2010-11-07 ENCOUNTER — Encounter: Payer: Self-pay | Admitting: Family Medicine

## 2010-11-07 ENCOUNTER — Ambulatory Visit (INDEPENDENT_AMBULATORY_CARE_PROVIDER_SITE_OTHER): Payer: Self-pay | Admitting: Family Medicine

## 2010-11-07 ENCOUNTER — Telehealth: Payer: Self-pay | Admitting: Family Medicine

## 2010-11-07 VITALS — BP 140/100 | Ht 61.5 in | Wt 193.0 lb

## 2010-11-07 DIAGNOSIS — I10 Essential (primary) hypertension: Secondary | ICD-10-CM

## 2010-11-07 DIAGNOSIS — Z3480 Encounter for supervision of other normal pregnancy, unspecified trimester: Secondary | ICD-10-CM

## 2010-11-07 DIAGNOSIS — K219 Gastro-esophageal reflux disease without esophagitis: Secondary | ICD-10-CM

## 2010-11-07 DIAGNOSIS — Z124 Encounter for screening for malignant neoplasm of cervix: Secondary | ICD-10-CM

## 2010-11-07 DIAGNOSIS — D649 Anemia, unspecified: Secondary | ICD-10-CM

## 2010-11-07 MED ORDER — FERROUS SULFATE 325 (65 FE) MG PO TABS: 325.0000 mg | ORAL_TABLET | Freq: Every day | ORAL | Status: AC

## 2010-11-07 MED ORDER — PRENATAL RX 60-1 MG PO TABS: 1.0000 | ORAL_TABLET | Freq: Every day | ORAL | Status: DC

## 2010-11-07 MED ORDER — LABETALOL HCL 100 MG PO TABS: 100.0000 mg | ORAL_TABLET | Freq: Two times a day (BID) | ORAL | Status: DC

## 2010-11-07 NOTE — Telephone Encounter (Signed)
Pt rescheduled to March 9 with Dr. Tye Savoy.  Ileana Ladd

## 2010-11-07 NOTE — Assessment & Plan Note (Signed)
Will start Ranitidine 75mg  po BID for GERD.  Also recommended Tums for indigestion.  If this does not improve, will switch to Omeprazole.  Told patient to call me if this is the case.  She understood the plan.

## 2010-11-07 NOTE — Progress Notes (Signed)
  Subjective:    Patient ID: Tamara Church, female    DOB: Oct 30, 1975, 35 y.o.   MRN: [id number]  HPI    Review of Systems  Constitutional: Positive for appetite change. Negative for fever and chills.  Cardiovascular: Negative for chest pain and leg swelling.  Genitourinary: Negative for dysuria, decreased urine volume, vaginal bleeding, vaginal discharge and difficulty urinating.  Neurological: Positive for headaches.       Objective:   Physical Exam  Constitutional: She appears well-developed and well-nourished. No distress.  Cardiovascular: Normal heart sounds.  Exam reveals no gallop and no friction rub.   No murmur heard. Pulmonary/Chest: Effort normal and breath sounds normal. She has no wheezes. She has no rales.  Abdominal: Soft. Bowel sounds are normal. She exhibits no distension. There is no tenderness.  Genitourinary: Vaginal discharge found.  Musculoskeletal: Normal range of motion. She exhibits no edema.  Skin: Skin is warm and dry.          Assessment & Plan:

## 2010-11-07 NOTE — Assessment & Plan Note (Signed)
Pt is doing very will during her first trimester.  Only c/o of reflux symptoms that have started with pregnancy.  Otherwise, she is asymptomatic.    Pt has chronic anemia and has been started on Fe per Dr. Jolayne Panther.    She has run out of Labetolol and prenatal vitamins in the last week.  Will refill these meds and recheck BP in 1 week.  Today BP was slightly elevated at 140/100.  If patient's BP remains elevated, may need to work up for Pre-eclampsia and consider referring her to [facility].  Will follow this closely.    For GERD, will start Ranitidine and tums.  If this does not relieve symptoms, may consider switching to Omeprazole.    Of note, patient's Rubella was equivocal at 7.5.  Will flag Dr. [place] to see if patient should receive MMR post partum.

## 2010-11-07 NOTE — Telephone Encounter (Signed)
Called pt to reschedule new ob appt, 1st avail was 3/9, pt said she would be here today.

## 2010-11-07 NOTE — Assessment & Plan Note (Signed)
Hgb was 10.5 at last visit.  Will continue Fe iron supplements.  If pt becomes constipated, will recommend Colace at follow-up appointment.

## 2010-11-07 NOTE — Assessment & Plan Note (Signed)
BP 140/100 today.  Will recheck in 1 week.  Will continue Labetolol 100mg  po BID.

## 2010-11-07 NOTE — Patient Instructions (Signed)
It was great to meet you today. Please schedule an appointment with me to recheck BP in 1 week. Please schedule an OB follow-up appointment in 4 weeks. I will let you know the results of your pap smear in 1-2 weeks. You seem to be doing very well.  Continue to take your prenatal vitamins with folic acid, Labetolol for BP, and iron supplements. You may take Ranitidine and Tums for your reflux. Thanks and congratulations. Dr. Sherron Flemings Sondra Come

## 2010-11-10 ENCOUNTER — Telehealth: Payer: Self-pay | Admitting: Family Medicine

## 2010-11-10 NOTE — Telephone Encounter (Signed)
Patient concerned that her blood pressure medication may have been lower than what she was on previously and is afraid her blood pressure may be too high.  She last checked it 3 days ago while on Labetalol and it was systolic 118.  Advised her this is a good blood pressure and that she should continue taking medication as Dr. Domenick Bookbinder prescribed and keep follow-up appt in 1 week for blood pressure recheck.  She states she has a blood pressure monitor at  Home and I asked her to check daily and bring log to next appt.

## 2010-11-10 NOTE — Telephone Encounter (Signed)
Pls see phone note about BP

## 2010-11-10 NOTE — Telephone Encounter (Signed)
States that BP meds were lowered and is concerned b/c her bp is elevated - not sure what to do

## 2010-11-14 ENCOUNTER — Ambulatory Visit (INDEPENDENT_AMBULATORY_CARE_PROVIDER_SITE_OTHER): Payer: Self-pay | Admitting: *Deleted

## 2010-11-14 VITALS — BP 130/90 | HR 100

## 2010-11-14 DIAGNOSIS — I10 Essential (primary) hypertension: Secondary | ICD-10-CM

## 2010-11-14 NOTE — Progress Notes (Signed)
Patient in for BP check today. BP checked manually using large adult cuff. BP LA 130/90  RA 124/88 pulse 100.  Patient states when she went to [facility] in January she was given Labetolol 200 mg twice daily. When MD sent her new RX in for labetolol last week  the RX was for 100 mg twice daily.  Will send message to MD and call patient back at [phone].

## 2010-11-17 ENCOUNTER — Telehealth: Payer: Self-pay | Admitting: Family Medicine

## 2010-11-17 NOTE — Progress Notes (Signed)
Received note back from  MD for patient to continue Labetolol 100 mg twice daily. She is to follow up with MD 12/02/2010 and she will reaccess at that time. Patient notified.

## 2010-11-26 NOTE — Progress Notes (Signed)
Pt G6P5 - will need more info about previous pregnancies.  Pt is chronic hypertensive, needs transfer to high risk.  Pt's rubella titers are equivocal, so she should have postpartum vaccine.  She reports she is not immune to varicella and should have titers drawn. She is obese and should have an early glucola.

## 2010-11-28 NOTE — Telephone Encounter (Signed)
Pt called and reminded of appt on Tuesday 03.13.2012 and also informed that she will have to do the glucola on this appt.Loralee Pacas Hitchcock

## 2010-12-01 LAB — POCT URINALYSIS DIPSTICK
Bilirubin Urine: NEGATIVE
Protein, ur: NEGATIVE mg/dL
pH: 6.5 (ref 5.0–8.0)

## 2010-12-01 LAB — POCT PREGNANCY, URINE: Preg Test, Ur: POSITIVE

## 2010-12-02 ENCOUNTER — Ambulatory Visit (INDEPENDENT_AMBULATORY_CARE_PROVIDER_SITE_OTHER): Payer: Medicaid Other | Admitting: Family Medicine

## 2010-12-02 ENCOUNTER — Encounter: Payer: Self-pay | Admitting: Family Medicine

## 2010-12-02 DIAGNOSIS — Z3480 Encounter for supervision of other normal pregnancy, unspecified trimester: Secondary | ICD-10-CM

## 2010-12-02 DIAGNOSIS — Z3490 Encounter for supervision of normal pregnancy, unspecified, unspecified trimester: Secondary | ICD-10-CM

## 2010-12-02 NOTE — Progress Notes (Addendum)
  Subjective:    Patient ID: Tamara Church, female    DOB: 1976/08/25, 35 y.o.   MRN: [id number]  HPI Patient is a 35 yo G6P5005 at 16.2 weeks according to LMP.  Patient doing well during pregnancy.  No complaints or concerns at this time.  Wants an anatomy U/S to find out baby's gender.  Desires BTL post-partum since this is her 6th pregnancy.    Prenatal History Addendum: 1. 1996 - NSVD; CHTN 2. 1998 - NSVD; CHTN 3. 2000 - NSVD; CHTN 4. 2002 - NSVD; CHTN 5. 2005 - NSVD; CHTN 6. 2012 - current pregnancy; risk factors - CHTN, obesity  Review of Systems Endorses good fetal activity.  Denies HA, N/V, constipation/diarrhea, pelvic or abdominal pain.  Denies vaginal bleeding, discharge, dysuria.     Objective:   Physical Exam  General: in no acute distress FHR: 150s FH: 18cm        Assessment & Plan:  Patient is a [id number] who presents to clinic at 16.2 weeks according to LMP.  Patient has chronic hypertension so will need referral to [facility].  Patient is obese, so she will be getting a 1hr GTT.  Denies exposure to Varicella, so titers will need to be drawn today.  Urine culture will also be obtained.  Will order US/Anatomy scan.  Will send [facility] referral letter.  Patient voices understanding.  Agree with above.  Pt with chronic HTN and needs transfer to high risk.  BP currently controlled on meds.  Anatomy scan best performed at 18 weeks.  Agree with plan to check 1 hr GTT and varicella titers today.

## 2010-12-02 NOTE — Patient Instructions (Signed)
It was great to see you. We will call you with an appointment at the [facility]. We will also schedule your anatomy Ultrasound and call you with the time and date. From now on, you will be seen at the [facility], but please call me if you have any questions of concerns. We will draw Varicella titers, do a 1 hr GTT, and urine culture.  I will inform you of the results. Thanks for your time. Dr. Sherron Flemings Sondra Come

## 2010-12-03 LAB — VARICELLA ZOSTER ANTIBODY, IGG: Varicella IgG: 2.82 {ISR} — ABNORMAL HIGH

## 2010-12-04 LAB — CULTURE, OB URINE
Colony Count: NO GROWTH
Organism ID, Bacteria: NO GROWTH

## 2010-12-07 LAB — WET PREP, GENITAL: Yeast Wet Prep HPF POC: NONE SEEN

## 2010-12-07 LAB — URINALYSIS, ROUTINE W REFLEX MICROSCOPIC
Glucose, UA: NEGATIVE mg/dL
Ketones, ur: NEGATIVE mg/dL
pH: 6 (ref 5.0–8.0)

## 2010-12-07 LAB — URINE MICROSCOPIC-ADD ON

## 2010-12-07 LAB — HERPES SIMPLEX VIRUS CULTURE

## 2010-12-08 ENCOUNTER — Telehealth: Payer: Self-pay | Admitting: Family Medicine

## 2010-12-08 LAB — URINALYSIS, ROUTINE W REFLEX MICROSCOPIC
Bilirubin Urine: NEGATIVE
Glucose, UA: NEGATIVE mg/dL
Ketones, ur: NEGATIVE mg/dL
Nitrite: NEGATIVE
Specific Gravity, Urine: <1.005 — ABNORMAL LOW (ref 1.005–1.030)
pH: 5.5 (ref 5.0–8.0)

## 2010-12-08 LAB — GC/CHLAMYDIA PROBE AMP, GENITAL
Chlamydia, DNA Probe: NEGATIVE
GC Probe Amp, Genital: NEGATIVE

## 2010-12-08 LAB — WET PREP, GENITAL
Trich, Wet Prep: NONE SEEN
Yeast Wet Prep HPF POC: NONE SEEN

## 2010-12-08 NOTE — Telephone Encounter (Signed)
Spoke with patient and informed her of the appointment set up for 12/12/2010 @ 10:30am at [facility]. Patient agreed to this appointment

## 2010-12-08 NOTE — Telephone Encounter (Signed)
[facility]

## 2010-12-09 ENCOUNTER — Encounter: Payer: Self-pay | Admitting: Family Medicine

## 2010-12-11 NOTE — Progress Notes (Signed)
Addended by: [place], SARAH on: 12/11/2010 04:46 PM   Modules accepted: Level of Service

## 2010-12-12 ENCOUNTER — Ambulatory Visit (HOSPITAL_COMMUNITY)
Admission: RE | Admit: 2010-12-12 | Discharge: 2010-12-12 | Disposition: A | Discharge status: Home / Self Care | Payer: Medicaid Other | Source: Ambulatory Visit | Attending: Family Medicine | Admitting: Family Medicine

## 2010-12-12 DIAGNOSIS — O10019 Pre-existing essential hypertension complicating pregnancy, unspecified trimester: Secondary | ICD-10-CM | POA: Insufficient documentation

## 2010-12-12 DIAGNOSIS — Z3689 Encounter for other specified antenatal screening: Secondary | ICD-10-CM | POA: Insufficient documentation

## 2010-12-25 ENCOUNTER — Telehealth: Payer: Self-pay | Admitting: Family Medicine

## 2010-12-25 NOTE — Telephone Encounter (Signed)
Was supposed to be referred to High [facility] and hasn't heard anything yet.  Please advise

## 2010-12-25 NOTE — Telephone Encounter (Signed)
Tamara Church 12/08/2010 2:17 PM Signed  Spoke with patient and informed her of the appointment set up for 12/12/2010 @ 10:30am at [facility]. Patient agreed to this appointment  It appears she had appointment, please see previous phone note message pasted above.  Please discuss further with Dr. Domenick Bookbinder as she made the referral.

## 2010-12-30 ENCOUNTER — Telehealth: Payer: Self-pay | Admitting: Family Medicine

## 2010-12-30 NOTE — Telephone Encounter (Signed)
Pt still haven't heard from [facility] regarding appt to [facility].  Was told that we would have to send referral.  Have been waiting for this since last ob visit.  Pt is very upset that she hasn't been seen there yet.  Want to have provider send referral and call when appt has been made.

## 2010-12-31 NOTE — Telephone Encounter (Signed)
Faxed referral to [facility]

## 2010-12-31 NOTE — Telephone Encounter (Signed)
Please discuss with Huntley Dec who made the referral and contacted the patient about the appointment time, or Dr. Tye Savoy who is her [facility] provider and made the referral.  I have fwd this again to both of them again.

## 2011-01-09 ENCOUNTER — Other Ambulatory Visit: Payer: Self-pay | Admitting: Family Medicine

## 2011-01-09 DIAGNOSIS — I10 Essential (primary) hypertension: Secondary | ICD-10-CM

## 2011-01-09 MED ORDER — LABETALOL HCL 100 MG PO TABS: 100.0000 mg | ORAL_TABLET | Freq: Two times a day (BID) | ORAL | Status: DC

## 2011-01-09 NOTE — Telephone Encounter (Addendum)
Message given to Dr. Tye Savoy regarding refill for labetalol   Received fax from pharmacy .she will refill.

## 2011-01-09 NOTE — Telephone Encounter (Signed)
Refill request

## 2011-01-09 NOTE — Telephone Encounter (Signed)
Spoke with patient and advised her to call [facility] now and have them send [place] refill request.

## 2011-01-09 NOTE — Telephone Encounter (Signed)
Patient states that she requested the refill this past Tuesday.   [facility] says they faxed it Wednesday.  I checked Dr. Leonie Green box but did not see a fax for it.  Will page Dr. Earnest Bailey.

## 2011-01-09 NOTE — Telephone Encounter (Signed)
Pt states she's been trying to get her BP meds all week and has now been out for 2 days - she is also pregnant.  Needs her BP meds refilled today - [facility] [address]

## 2011-01-10 NOTE — Telephone Encounter (Signed)
I called pharmacy and they verified that they received Rx.  I spoke to patient and she is aware that her Rx will be ready in 1 hour.

## 2011-01-12 ENCOUNTER — Telehealth: Payer: Self-pay | Admitting: Family Medicine

## 2011-01-12 NOTE — Telephone Encounter (Signed)
Pt has a broken tooth, Is aprox 6 mos pregnant & wants to know if she can have dental work done?

## 2011-01-12 NOTE — Telephone Encounter (Signed)
Spoke with pt. Told her dentist visit is ok. May use tylenol for pain. Make sure to have leaded apron that covers abd & neck if x rays are done. Local such as novacaine is ok. No laughing gas or general anesthesia Have dentist call for any questions or concerns

## 2011-01-14 ENCOUNTER — Telehealth: Payer: Self-pay | Admitting: Family Medicine

## 2011-01-14 NOTE — Telephone Encounter (Signed)
The dentist gave her antibiotics & tylenol #3. Told her she can only take tylenol, not codiene. She will not take it. Dentist is going to pull the tooth in 2 weeks after the infection is under control

## 2011-01-14 NOTE — Telephone Encounter (Signed)
Went to dentist yesterday and was given Tylenol 3 2/ codeine.  Wants to know if she can take it.

## 2011-01-15 ENCOUNTER — Other Ambulatory Visit: Payer: Self-pay | Admitting: Family Medicine

## 2011-01-15 DIAGNOSIS — O169 Unspecified maternal hypertension, unspecified trimester: Secondary | ICD-10-CM

## 2011-01-15 DIAGNOSIS — Z331 Pregnant state, incidental: Secondary | ICD-10-CM

## 2011-01-29 ENCOUNTER — Other Ambulatory Visit: Payer: Self-pay | Admitting: Physician Assistant

## 2011-01-29 DIAGNOSIS — IMO0002 Reserved for concepts with insufficient information to code with codable children: Secondary | ICD-10-CM

## 2011-01-29 DIAGNOSIS — O165 Unspecified maternal hypertension, complicating the puerperium: Secondary | ICD-10-CM

## 2011-01-29 DIAGNOSIS — O99019 Anemia complicating pregnancy, unspecified trimester: Secondary | ICD-10-CM

## 2011-01-29 DIAGNOSIS — Z331 Pregnant state, incidental: Secondary | ICD-10-CM

## 2011-01-29 LAB — POCT URINALYSIS DIP (DEVICE)
Bilirubin Urine: NEGATIVE
Glucose, UA: NEGATIVE mg/dL
Ketones, ur: NEGATIVE mg/dL
Nitrite: NEGATIVE

## 2011-02-05 ENCOUNTER — Other Ambulatory Visit: Payer: Self-pay | Admitting: Family Medicine

## 2011-02-05 ENCOUNTER — Ambulatory Visit (HOSPITAL_COMMUNITY)
Admission: RE | Admit: 2011-02-05 | Discharge: 2011-02-05 | Disposition: A | Discharge status: Home / Self Care | Payer: Medicaid Other | Source: Ambulatory Visit | Attending: Family Medicine | Admitting: Family Medicine

## 2011-02-05 DIAGNOSIS — O10019 Pre-existing essential hypertension complicating pregnancy, unspecified trimester: Secondary | ICD-10-CM | POA: Insufficient documentation

## 2011-02-05 DIAGNOSIS — Z3689 Encounter for other specified antenatal screening: Secondary | ICD-10-CM | POA: Insufficient documentation

## 2011-02-06 NOTE — Discharge Summary (Signed)
NAME:  Tamara Church, Tamara Church                         ACCOUNT NO.:  [id number]   MEDICAL RECORD NO.:  [id number]                   PATIENT TYPE:  INP   LOCATION:  9122                                 FACILITY:  [facility]   PHYSICIAN:  James A. Ashley Royalty, M.D.             DATE OF BIRTH:  30-Nov-1975   DATE OF ADMISSION:  02/03/2004  DATE OF DISCHARGE:  02/05/2004                                 DISCHARGE SUMMARY   DISCHARGE DIAGNOSES:  1. Intrauterine pregnancy at term, delivered.  2. Spontaneous rupture of membranes.  3. Group B streptococcus carrier.  4. Term birth living child, vertex.   OPERATIONS AND SPECIAL PROCEDURES:  OB delivery.   CONSULTATIONS:  None.   DISCHARGE MEDICATIONS:  Chromagen.   HISTORY AND PHYSICAL:  This is a 35 year old gravida 5 para 4 at [redacted] weeks  gestation with the aforementioned risk factors who presented in labor and  experienced spontaneous rupture of membranes.  For the remainder of the  history and physical please see chart.   HOSPITAL COURSE:  The patient was admitted to [facility].  Admission laboratory studies were drawn.  Initial cervical  examination revealed the cervix to be 3-4 cm dilated, 80% effaced, -3  station.  Amniotic fluid was noted to be clear.  The patient went on to  labor and deliver on Feb 03, 2004 a 6-pound 9-ounce female, Apgars of 8 at  one minute and 9 at five minutes, sent to newborn nursery.  Delivery was  accomplished by Dr. Lily Peer over an intact perineum.  At the time of this  dictation I appreciate no arterial cord pH recorded in the chart.  That  value is pending.   DISPOSITION:  The patient is to return to [facility] and Obstetrics  in 4-6 weeks for postpartum evaluation.                                               James A. Ashley Royalty, M.D.    JAM/MEDQ  D:  02/20/2004  T:  02/21/2004  Job:  [id number]
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
## 2011-02-12 ENCOUNTER — Other Ambulatory Visit: Payer: Self-pay | Admitting: Obstetrics and Gynecology

## 2011-02-12 DIAGNOSIS — O0990 Supervision of high risk pregnancy, unspecified, unspecified trimester: Secondary | ICD-10-CM

## 2011-02-12 DIAGNOSIS — O169 Unspecified maternal hypertension, unspecified trimester: Secondary | ICD-10-CM

## 2011-02-12 LAB — POCT URINALYSIS DIP (DEVICE)
Bilirubin Urine: NEGATIVE
Glucose, UA: NEGATIVE mg/dL
Ketones, ur: NEGATIVE mg/dL
Specific Gravity, Urine: 1.02 (ref 1.005–1.030)

## 2011-02-18 ENCOUNTER — Other Ambulatory Visit: Payer: Self-pay | Admitting: Family Medicine

## 2011-02-18 ENCOUNTER — Other Ambulatory Visit: Payer: Self-pay | Admitting: Physician Assistant

## 2011-02-18 DIAGNOSIS — O169 Unspecified maternal hypertension, unspecified trimester: Secondary | ICD-10-CM

## 2011-02-18 DIAGNOSIS — O99019 Anemia complicating pregnancy, unspecified trimester: Secondary | ICD-10-CM

## 2011-02-18 DIAGNOSIS — Z331 Pregnant state, incidental: Secondary | ICD-10-CM

## 2011-02-18 DIAGNOSIS — IMO0002 Reserved for concepts with insufficient information to code with codable children: Secondary | ICD-10-CM

## 2011-02-18 LAB — POCT URINALYSIS DIP (DEVICE)
Bilirubin Urine: NEGATIVE
Glucose, UA: NEGATIVE mg/dL
Ketones, ur: NEGATIVE mg/dL
Specific Gravity, Urine: 1.025 (ref 1.005–1.030)
Urobilinogen, UA: 0.2 mg/dL (ref 0.0–1.0)

## 2011-02-23 ENCOUNTER — Other Ambulatory Visit: Payer: Medicaid Other

## 2011-03-05 ENCOUNTER — Other Ambulatory Visit: Payer: Medicaid Other

## 2011-03-05 ENCOUNTER — Other Ambulatory Visit: Payer: Self-pay | Admitting: Obstetrics & Gynecology

## 2011-03-05 DIAGNOSIS — O169 Unspecified maternal hypertension, unspecified trimester: Secondary | ICD-10-CM

## 2011-03-05 DIAGNOSIS — O99019 Anemia complicating pregnancy, unspecified trimester: Secondary | ICD-10-CM

## 2011-03-05 DIAGNOSIS — O288 Other abnormal findings on antenatal screening of mother: Secondary | ICD-10-CM

## 2011-03-05 LAB — POCT URINALYSIS DIP (DEVICE)
Bilirubin Urine: NEGATIVE
Glucose, UA: NEGATIVE mg/dL
Hgb urine dipstick: NEGATIVE
Ketones, ur: NEGATIVE mg/dL
Nitrite: NEGATIVE
Specific Gravity, Urine: 1.02 (ref 1.005–1.030)

## 2011-03-09 ENCOUNTER — Other Ambulatory Visit: Payer: Medicaid Other

## 2011-03-09 DIAGNOSIS — O169 Unspecified maternal hypertension, unspecified trimester: Secondary | ICD-10-CM

## 2011-03-12 ENCOUNTER — Other Ambulatory Visit: Payer: Medicaid Other

## 2011-03-12 ENCOUNTER — Inpatient Hospital Stay (HOSPITAL_COMMUNITY)
Admission: AD | Admit: 2011-03-12 | Discharge: 2011-03-12 | Disposition: A | Discharge status: Home / Self Care | Payer: Medicaid Other | Source: Ambulatory Visit | Attending: Obstetrics & Gynecology | Admitting: Obstetrics & Gynecology

## 2011-03-12 ENCOUNTER — Other Ambulatory Visit: Payer: Self-pay | Admitting: Obstetrics & Gynecology

## 2011-03-12 ENCOUNTER — Ambulatory Visit (HOSPITAL_COMMUNITY)
Admission: RE | Admit: 2011-03-12 | Discharge: 2011-03-12 | Disposition: A | Discharge status: Home / Self Care | Payer: Medicaid Other | Source: Ambulatory Visit | Attending: Obstetrics & Gynecology | Admitting: Obstetrics & Gynecology

## 2011-03-12 DIAGNOSIS — Z79899 Other long term (current) drug therapy: Secondary | ICD-10-CM | POA: Insufficient documentation

## 2011-03-12 DIAGNOSIS — O10019 Pre-existing essential hypertension complicating pregnancy, unspecified trimester: Secondary | ICD-10-CM

## 2011-03-12 DIAGNOSIS — O169 Unspecified maternal hypertension, unspecified trimester: Secondary | ICD-10-CM

## 2011-03-12 DIAGNOSIS — A499 Bacterial infection, unspecified: Secondary | ICD-10-CM | POA: Insufficient documentation

## 2011-03-12 DIAGNOSIS — O288 Other abnormal findings on antenatal screening of mother: Secondary | ICD-10-CM

## 2011-03-12 DIAGNOSIS — B9689 Other specified bacterial agents as the cause of diseases classified elsewhere: Secondary | ICD-10-CM | POA: Insufficient documentation

## 2011-03-12 DIAGNOSIS — N760 Acute vaginitis: Secondary | ICD-10-CM | POA: Insufficient documentation

## 2011-03-12 DIAGNOSIS — Z3689 Encounter for other specified antenatal screening: Secondary | ICD-10-CM

## 2011-03-12 DIAGNOSIS — O2390 Unspecified genitourinary tract infection in pregnancy, unspecified trimester: Secondary | ICD-10-CM | POA: Insufficient documentation

## 2011-03-12 DIAGNOSIS — I10 Essential (primary) hypertension: Secondary | ICD-10-CM

## 2011-03-12 DIAGNOSIS — O4700 False labor before 37 completed weeks of gestation, unspecified trimester: Secondary | ICD-10-CM | POA: Insufficient documentation

## 2011-03-12 LAB — POCT URINALYSIS DIP (DEVICE)
Bilirubin Urine: NEGATIVE
Ketones, ur: NEGATIVE mg/dL
Leukocytes, UA: NEGATIVE
Specific Gravity, Urine: 1.015 (ref 1.005–1.030)
pH: 6 (ref 5.0–8.0)

## 2011-03-12 LAB — WET PREP, GENITAL: Yeast Wet Prep HPF POC: NONE SEEN

## 2011-03-13 LAB — STREP B DNA PROBE: Strep Group B Ag: NEGATIVE

## 2011-03-16 ENCOUNTER — Other Ambulatory Visit: Payer: Medicaid Other

## 2011-03-16 DIAGNOSIS — O169 Unspecified maternal hypertension, unspecified trimester: Secondary | ICD-10-CM

## 2011-03-19 ENCOUNTER — Other Ambulatory Visit: Payer: Medicaid Other

## 2011-03-19 ENCOUNTER — Ambulatory Visit (HOSPITAL_COMMUNITY)
Admission: RE | Admit: 2011-03-19 | Discharge: 2011-03-19 | Disposition: A | Discharge status: Home / Self Care | Payer: Medicaid Other | Source: Ambulatory Visit | Attending: Family Medicine | Admitting: Family Medicine

## 2011-03-19 ENCOUNTER — Other Ambulatory Visit: Payer: Self-pay | Admitting: Obstetrics & Gynecology

## 2011-03-19 DIAGNOSIS — O169 Unspecified maternal hypertension, unspecified trimester: Secondary | ICD-10-CM

## 2011-03-19 DIAGNOSIS — O10019 Pre-existing essential hypertension complicating pregnancy, unspecified trimester: Secondary | ICD-10-CM | POA: Insufficient documentation

## 2011-03-19 DIAGNOSIS — IMO0002 Reserved for concepts with insufficient information to code with codable children: Secondary | ICD-10-CM

## 2011-03-19 DIAGNOSIS — O99019 Anemia complicating pregnancy, unspecified trimester: Secondary | ICD-10-CM

## 2011-03-19 DIAGNOSIS — Z3689 Encounter for other specified antenatal screening: Secondary | ICD-10-CM | POA: Insufficient documentation

## 2011-03-19 LAB — POCT URINALYSIS DIP (DEVICE)
Ketones, ur: NEGATIVE mg/dL
Protein, ur: NEGATIVE mg/dL
Specific Gravity, Urine: 1.02 (ref 1.005–1.030)
pH: 6.5 (ref 5.0–8.0)

## 2011-03-23 ENCOUNTER — Other Ambulatory Visit: Payer: Medicaid Other

## 2011-03-23 DIAGNOSIS — O169 Unspecified maternal hypertension, unspecified trimester: Secondary | ICD-10-CM

## 2011-03-26 ENCOUNTER — Other Ambulatory Visit: Payer: Medicaid Other

## 2011-03-27 ENCOUNTER — Other Ambulatory Visit: Payer: Self-pay

## 2011-03-27 DIAGNOSIS — O169 Unspecified maternal hypertension, unspecified trimester: Secondary | ICD-10-CM

## 2011-03-30 ENCOUNTER — Ambulatory Visit (INDEPENDENT_AMBULATORY_CARE_PROVIDER_SITE_OTHER): Payer: Medicaid Other | Admitting: Family Medicine

## 2011-03-30 DIAGNOSIS — O169 Unspecified maternal hypertension, unspecified trimester: Secondary | ICD-10-CM

## 2011-04-02 ENCOUNTER — Other Ambulatory Visit: Payer: Self-pay | Admitting: Obstetrics and Gynecology

## 2011-04-02 ENCOUNTER — Ambulatory Visit (INDEPENDENT_AMBULATORY_CARE_PROVIDER_SITE_OTHER): Payer: Medicaid Other | Admitting: Family Medicine

## 2011-04-02 DIAGNOSIS — O169 Unspecified maternal hypertension, unspecified trimester: Secondary | ICD-10-CM

## 2011-04-02 LAB — FETAL NONSTRESS TEST

## 2011-04-02 LAB — POCT URINALYSIS DIP (DEVICE)
Hgb urine dipstick: NEGATIVE
Protein, ur: NEGATIVE mg/dL
Specific Gravity, Urine: 1.025 (ref 1.005–1.030)
Urobilinogen, UA: 0.2 mg/dL (ref 0.0–1.0)

## 2011-04-04 LAB — STREP B DNA PROBE: GBSP: NEGATIVE

## 2011-04-06 ENCOUNTER — Ambulatory Visit (INDEPENDENT_AMBULATORY_CARE_PROVIDER_SITE_OTHER): Payer: Medicaid Other | Admitting: Family Medicine

## 2011-04-06 DIAGNOSIS — O169 Unspecified maternal hypertension, unspecified trimester: Secondary | ICD-10-CM

## 2011-04-09 ENCOUNTER — Ambulatory Visit: Payer: Medicaid Other | Admitting: *Deleted

## 2011-04-09 ENCOUNTER — Other Ambulatory Visit: Payer: Self-pay | Admitting: Family Medicine

## 2011-04-09 DIAGNOSIS — O99019 Anemia complicating pregnancy, unspecified trimester: Secondary | ICD-10-CM

## 2011-04-09 DIAGNOSIS — IMO0002 Reserved for concepts with insufficient information to code with codable children: Secondary | ICD-10-CM

## 2011-04-09 DIAGNOSIS — O169 Unspecified maternal hypertension, unspecified trimester: Secondary | ICD-10-CM

## 2011-04-09 LAB — POCT URINALYSIS DIP (DEVICE)
Bilirubin Urine: NEGATIVE
Glucose, UA: NEGATIVE mg/dL
Ketones, ur: NEGATIVE mg/dL
Leukocytes, UA: NEGATIVE
Protein, ur: NEGATIVE mg/dL

## 2011-04-09 LAB — US OB FOLLOW UP

## 2011-04-13 ENCOUNTER — Ambulatory Visit: Payer: Medicaid Other | Admitting: *Deleted

## 2011-04-13 DIAGNOSIS — O169 Unspecified maternal hypertension, unspecified trimester: Secondary | ICD-10-CM

## 2011-04-16 ENCOUNTER — Ambulatory Visit: Payer: Medicaid Other | Admitting: Advanced Practice Midwife

## 2011-04-16 ENCOUNTER — Ambulatory Visit (HOSPITAL_COMMUNITY)
Admission: RE | Admit: 2011-04-16 | Discharge: 2011-04-16 | Disposition: A | Discharge status: Home / Self Care | Payer: Medicaid Other | Source: Ambulatory Visit | Attending: Physician Assistant | Admitting: Physician Assistant

## 2011-04-16 ENCOUNTER — Inpatient Hospital Stay (HOSPITAL_COMMUNITY)
Admission: AD | Admit: 2011-04-16 | Discharge: 2011-04-16 | Disposition: A | Discharge status: Home / Self Care | Payer: Medicaid Other | Source: Ambulatory Visit | Attending: Obstetrics & Gynecology | Admitting: Obstetrics & Gynecology

## 2011-04-16 ENCOUNTER — Other Ambulatory Visit: Payer: Self-pay | Admitting: Obstetrics & Gynecology

## 2011-04-16 ENCOUNTER — Encounter (HOSPITAL_COMMUNITY): Payer: Self-pay | Admitting: Family Medicine

## 2011-04-16 DIAGNOSIS — I10 Essential (primary) hypertension: Secondary | ICD-10-CM | POA: Insufficient documentation

## 2011-04-16 DIAGNOSIS — O169 Unspecified maternal hypertension, unspecified trimester: Secondary | ICD-10-CM

## 2011-04-16 DIAGNOSIS — O0990 Supervision of high risk pregnancy, unspecified, unspecified trimester: Secondary | ICD-10-CM

## 2011-04-16 LAB — CBC
Hemoglobin: 10.4 g/dL — ABNORMAL LOW (ref 12.0–15.0)
MCH: 29.3 pg (ref 26.0–34.0)
MCHC: 33.3 g/dL (ref 30.0–36.0)
Platelets: 159 10*3/uL (ref 150–400)
RBC: 3.55 MIL/uL — ABNORMAL LOW (ref 3.87–5.11)

## 2011-04-16 LAB — COMPREHENSIVE METABOLIC PANEL
ALT: 13 U/L (ref 0–35)
AST: 15 U/L (ref 0–37)
Alkaline Phosphatase: 168 U/L — ABNORMAL HIGH (ref 39–117)
Calcium: 10.6 mg/dL — ABNORMAL HIGH (ref 8.4–10.5)
Glucose, Bld: 82 mg/dL (ref 70–99)
Potassium: 4 mEq/L (ref 3.5–5.1)
Sodium: 132 mEq/L — ABNORMAL LOW (ref 135–145)
Total Protein: 7.8 g/dL (ref 6.0–8.3)

## 2011-04-16 LAB — POCT URINALYSIS DIP (DEVICE)
Bilirubin Urine: NEGATIVE
Glucose, UA: NEGATIVE mg/dL
Specific Gravity, Urine: 1.025 (ref 1.005–1.030)
Urobilinogen, UA: 0.2 mg/dL (ref 0.0–1.0)

## 2011-04-16 LAB — PROTEIN / CREATININE RATIO, URINE: Protein Creatinine Ratio: 0.16 — ABNORMAL HIGH (ref 0.00–0.15)

## 2011-04-16 MED ORDER — LABETALOL HCL 100 MG PO TABS: 200.0000 mg | ORAL_TABLET | Freq: Two times a day (BID) | ORAL | Status: DC

## 2011-04-16 NOTE — H&P (Signed)
Tamara Church is an 35 y.o. female. Presents to clinic for routine evaluation       Past Medical History  Diagnosis Date  . Anemia   . Hypertension     No past surgical history on file.  No family history on file.  Social History:  reports that she has never smoked. She has never used smokeless tobacco. She reports that she does not drink alcohol or use illicit drugs.  Allergies: No Known Allergies   (Not in a hospital admission)  Review of Systems  Constitutional: Negative for fever.  Eyes: Negative for blurred vision.  Neurological: Negative for headaches.    Last menstrual period 08/10/2010. Physical Exam  Constitutional: She is oriented to person, place, and time. She appears well-developed and well-nourished.  HENT:  Head: Normocephalic.  Neck: Normal range of motion.  Cardiovascular: Normal rate.   Respiratory: Effort normal.  GI: Soft.  Genitourinary: Vagina normal and uterus normal. Vaginal discharge: Cervix 2/60/-1/vtx/soft / mid.  Musculoskeletal: She exhibits no edema.  Neurological: She is alert and oriented to person, place, and time. She has normal reflexes. Abnormal reflex: Clonus one beat.  Skin: Skin is warm and dry.  Psychiatric: She has a normal mood and affect.    Results for orders placed in visit on 04/16/11 (from the past 24 hour(s))  POCT URINALYSIS DIP (DEVICE)     Status: Abnormal   Collection Time   04/16/11  9:45 AM      Component Value Range   Glucose, UA NEGATIVE  NEGATIVE (mg/dL)   Bilirubin Urine NEGATIVE  NEGATIVE    Ketones, ur TRACE (*) NEGATIVE (mg/dL)   Specific Gravity, Urine 1.025  1.005 - 1.030    Hgb urine dipstick NEGATIVE  NEGATIVE    pH 6.5  5.0 - 8.0    Protein, ur 30 (*) NEGATIVE (mg/dL)   Urobilinogen, UA 0.2  0.0 - 1.0 (mg/dL)   Nitrite NEGATIVE  NEGATIVE    Leukocytes, UA NEGATIVE  NEGATIVE     @[id number] @  Assessment/Plan: IUP at 38.1weeks PIH on Labetolol 100mg  bid Elevated BP today  Plan:  Will  send to [facility] for labs and recheck BP [facility] 04/16/2011, 10:24 AM

## 2011-04-16 NOTE — ED Provider Notes (Signed)
Doing well. Sleeping. Denies headache or blurred vision. BPs improved, 140s/90s.   Labs wnl, including Pr/Cr ratio of 0.16  Per Dr Macon Large, will d/c home. Keep appt for 04/22/11 for induction of labor.  Preeclampsia precautions.

## 2011-04-16 NOTE — ED Provider Notes (Signed)
Chief Complaint:  Hypertension   Tamara Church is  35 y.o. G6P5005 at [redacted]w[redacted]d presents complaining of Hypertension . She states irregular, every 10 minutes associated with none and vaginal bleeding., intact, along with active, no headache, vision changes, or RUQ pain.  No edema. No LOC. Pt seen in clinic and referred for further evaluation of her blood pressure.    Obstetrical/Gynecological History: OB History    Grav Para Term Preterm Abortions TAB SAB Ect Mult Living   6 5 5  0 0 0 0 0 0 5     No STis or abnormal pap Past Medical History: Past Medical History  Diagnosis Date  . Anemia   . Hypertension     Past Surgical History: History reviewed. No pertinent past surgical history.  Family History: No family history on file.  Social History: History  Substance Use Topics  . Smoking status: Never Smoker   . Smokeless tobacco: Never Used  . Alcohol Use: No    Allergies: No Known Allergies  Prescriptions prior to admission  Medication Sig Dispense Refill  . ferrous sulfate 325 (65 FE) MG tablet Take 1 tablet (325 mg total) by mouth daily with breakfast.  30 tablet  3  . labetalol (NORMODYNE) 100 MG tablet Take 1 tablet (100 mg total) by mouth 2 (two) times daily.  60 tablet  5  . prenatal vitamin w/FE, FA (PRENATAL 1 + 1) 27-1 MG TABS Take 1 tablet by mouth daily.        Marland Kitchen amLODipine (NORVASC) 5 MG tablet Take 5 mg by mouth daily.        . hydrochlorothiazide 25 MG tablet Take 25 mg by mouth daily. Use until maxide available       . omeprazole (PRILOSEC) 40 MG capsule Take 40 mg by mouth daily.        . Prenatal Vit-Fe Fumarate-FA (PRENATAL MULTIVITAMIN) 60-1 MG tablet Take 1 tablet by mouth daily.  30 tablet  11  . Prenatal Vit-Fe Psac Cmplx-FA (PRENATAL MULTIVITAMIN) 60-1 MG tablet Take 1 tablet by mouth daily with breakfast.        . ranitidine (ZANTAC) 75 MG tablet Take 75 mg by mouth 2 (two) times daily.        Marland Kitchen triamterene-hydrochlorothiazide (MAXZIDE-25) 37.5-25  MG per tablet Take 1 tablet by mouth daily.          Review of Systems - Negative except per HPI  Physical Exam   Blood pressure 145/97, pulse 84, temperature 98.3 F (36.8 C), resp. rate 20, height 5' 1.5" (1.562 m), last menstrual period 08/10/2010.  General: General appearance - alert, well appearing, and in no distress Chest - clear to auscultation, no wheezes, rales or rhonchi, symmetric air entry Heart - normal rate, regular rhythm, normal S1, S2, no murmurs, rubs, clicks or gallops Abdomen - soft, nontender, nondistended, no masses or organomegaly Gravid, size cwd, EFW 7lbs by leopolds, vertex by leopolds Neurological - alert, oriented, normal speech, no focal findings or movement disorder noted, DTR's normal and symmetric, no clonus Extremities - peripheral pulses normal, no pedal edema, no clubbing or cyanosis Baseline: 125 bpm, Variability: Good {> 6 bpm), Accelerations: 15x15 and Decelerations: Absent irregular, every 10 minutes   Labs: Recent Results (from the past 24 hour(s))  POCT URINALYSIS DIP (DEVICE)   Collection Time   04/16/11  9:45 AM      Component Value Range   Glucose, UA NEGATIVE  NEGATIVE (mg/dL)   Bilirubin Urine NEGATIVE  NEGATIVE  Ketones, ur TRACE (*) NEGATIVE (mg/dL)   Specific Gravity, Urine 1.025  1.005 - 1.030    Hgb urine dipstick NEGATIVE  NEGATIVE    pH 6.5  5.0 - 8.0    Protein, ur 30 (*) NEGATIVE (mg/dL)   Urobilinogen, UA 0.2  0.0 - 1.0 (mg/dL)   Nitrite NEGATIVE  NEGATIVE    Leukocytes, UA NEGATIVE  NEGATIVE   CBC   Collection Time   04/16/11 11:10 AM      Component Value Range   WBC 8.1  4.0 - 10.5 (K/uL)   RBC 3.55 (*) 3.87 - 5.11 (MIL/uL)   Hemoglobin 10.4 (*) 12.0 - 15.0 (g/dL)   HCT 52.8 (*) 41.3 - 46.0 (%)   MCV 87.9  78.0 - 100.0 (fL)   MCH 29.3  26.0 - 34.0 (pg)   MCHC 33.3  30.0 - 36.0 (g/dL)   RDW 24.4  01.0 - 27.2 (%)   Platelets 159  150 - 400 (K/uL)   Imaging Studies:  US Ob Follow Up  04/16/2011  OBSTETRICAL  ULTRASOUND: This exam was performed within a [place] [facility] Department. The OB US report was generated in the AS system, and faxed to the ordering physician.   This report is also available in [employer] and in the [employer]. See AS Obstetric US report.   US Ob Follow Up  03/19/2011  OBSTETRICAL ULTRASOUND: This exam was performed within a [place] [facility] Department. The OB US report was generated in the AS system, and faxed to the ordering physician.   This report is also available in [employer] and in the [employer]. See [facility] Obstetric US report.     Assessment: Tamara Church is  35 y.o. G6P5005 at [redacted]w[redacted]d presents with elevated blood pressure with h/o hypertension on labetalol. R/o superimposed preE. Marland Kitchen  Plan: 1. Awaiting urine pr/cr ratio and CMP.   Jetty Berland,LACHELLE7/26/201211:35 AM

## 2011-04-16 NOTE — Progress Notes (Signed)
Sent from clinic for increase BP

## 2011-04-17 ENCOUNTER — Inpatient Hospital Stay (HOSPITAL_COMMUNITY)
Admission: AD | Admit: 2011-04-17 | Discharge: 2011-04-19 | DRG: 774 | Disposition: A | Discharge status: Home / Self Care | Payer: Medicaid Other | Source: Ambulatory Visit | Attending: Obstetrics & Gynecology | Admitting: Obstetrics & Gynecology

## 2011-04-17 DIAGNOSIS — O1002 Pre-existing essential hypertension complicating childbirth: Principal | ICD-10-CM | POA: Diagnosis present

## 2011-04-17 DIAGNOSIS — D649 Anemia, unspecified: Secondary | ICD-10-CM

## 2011-04-17 LAB — CBC
HCT: 31.1 % — ABNORMAL LOW (ref 36.0–46.0)
Hemoglobin: 10.3 g/dL — ABNORMAL LOW (ref 12.0–15.0)
MCV: 87.9 fL (ref 78.0–100.0)
RBC: 3.54 MIL/uL — ABNORMAL LOW (ref 3.87–5.11)
WBC: 8.7 10*3/uL (ref 4.0–10.5)

## 2011-04-17 MED ORDER — ONDANSETRON HCL 4 MG/2ML IJ SOLN: 4.0000 mg | Freq: Four times a day (QID) | INTRAMUSCULAR | Status: DC | PRN

## 2011-04-17 MED ORDER — SODIUM CHLORIDE 0.9 % IV SOLN: 250.0000 mL | INTRAVENOUS | Status: DC

## 2011-04-17 MED ORDER — LACTATED RINGERS IV SOLN: INTRAVENOUS | Status: DC

## 2011-04-17 MED ORDER — BENZOCAINE-MENTHOL 20-0.5 % EX AERO
INHALATION_SPRAY | CUTANEOUS | Status: AC
  Administered 2011-04-17: 11:00:00
  Filled 2011-04-17: qty 56

## 2011-04-17 MED ORDER — IBUPROFEN 600 MG PO TABS: 600.0000 mg | ORAL_TABLET | Freq: Four times a day (QID) | ORAL | Status: DC | PRN

## 2011-04-17 MED ORDER — SENNOSIDES-DOCUSATE SODIUM 8.6-50 MG PO TABS
1.0000 | ORAL_TABLET | Freq: Every day | ORAL | Status: DC
  Administered 2011-04-17 – 2011-04-18 (×2): 1 via ORAL

## 2011-04-17 MED ORDER — IBUPROFEN 600 MG PO TABS
600.0000 mg | ORAL_TABLET | Freq: Four times a day (QID) | ORAL | Status: DC
  Administered 2011-04-17 – 2011-04-19 (×9): 600 mg via ORAL
  Filled 2011-04-17 (×9): qty 1

## 2011-04-17 MED ORDER — BENZOCAINE-MENTHOL 20-0.5 % EX AERO: 1.0000 "application " | INHALATION_SPRAY | CUTANEOUS | Status: DC | PRN

## 2011-04-17 MED ORDER — ZOLPIDEM TARTRATE 5 MG PO TABS: 5.0000 mg | ORAL_TABLET | Freq: Every evening | ORAL | Status: DC | PRN

## 2011-04-17 MED ORDER — FLEET ENEMA 7-19 GM/118ML RE ENEM: 1.0000 | ENEMA | RECTAL | Status: DC | PRN

## 2011-04-17 MED ORDER — LIDOCAINE HCL (PF) 1 % IJ SOLN
30.0000 mL | INTRAMUSCULAR | Status: DC | PRN
  Filled 2011-04-17 (×2): qty 30

## 2011-04-17 MED ORDER — OXYCODONE-ACETAMINOPHEN 5-325 MG PO TABS: 2.0000 | ORAL_TABLET | ORAL | Status: DC | PRN

## 2011-04-17 MED ORDER — DIPHENHYDRAMINE HCL 25 MG PO CAPS: 25.0000 mg | ORAL_CAPSULE | Freq: Four times a day (QID) | ORAL | Status: DC | PRN

## 2011-04-17 MED ORDER — LANOLIN HYDROUS EX OINT: TOPICAL_OINTMENT | CUTANEOUS | Status: DC | PRN

## 2011-04-17 MED ORDER — ONDANSETRON HCL 4 MG PO TABS: 4.0000 mg | ORAL_TABLET | ORAL | Status: DC | PRN

## 2011-04-17 MED ORDER — PRENATAL PLUS 27-1 MG PO TABS
1.0000 | ORAL_TABLET | Freq: Every day | ORAL | Status: DC
  Administered 2011-04-17 – 2011-04-19 (×3): 1 via ORAL
  Filled 2011-04-17 (×3): qty 1

## 2011-04-17 MED ORDER — CITRIC ACID-SODIUM CITRATE 334-500 MG/5ML PO SOLN: 30.0000 mL | ORAL | Status: DC | PRN

## 2011-04-17 MED ORDER — FERROUS SULFATE 325 (65 FE) MG PO TABS
325.0000 mg | ORAL_TABLET | Freq: Every day | ORAL | Status: DC
  Administered 2011-04-17 – 2011-04-19 (×3): 325 mg via ORAL
  Filled 2011-04-17 (×3): qty 1

## 2011-04-17 MED ORDER — ONDANSETRON HCL 4 MG/2ML IJ SOLN: 4.0000 mg | INTRAMUSCULAR | Status: DC | PRN

## 2011-04-17 MED ORDER — ACETAMINOPHEN 325 MG PO TABS: 650.0000 mg | ORAL_TABLET | ORAL | Status: DC | PRN

## 2011-04-17 MED ORDER — OXYTOCIN 20 UNITS IN LACTATED RINGERS INFUSION - SIMPLE
125.0000 mL/h | Freq: Once | INTRAVENOUS | Status: AC
  Administered 2011-04-17: 999 mL/h via INTRAVENOUS
  Filled 2011-04-17: qty 1000

## 2011-04-17 MED ORDER — LABETALOL HCL 100 MG PO TABS
100.0000 mg | ORAL_TABLET | Freq: Two times a day (BID) | ORAL | Status: DC
  Filled 2011-04-17: qty 1

## 2011-04-17 MED ORDER — SIMETHICONE 80 MG PO CHEW: 80.0000 mg | CHEWABLE_TABLET | ORAL | Status: DC | PRN

## 2011-04-17 MED ORDER — OXYCODONE-ACETAMINOPHEN 5-325 MG PO TABS: 1.0000 | ORAL_TABLET | ORAL | Status: DC | PRN

## 2011-04-17 MED ORDER — LABETALOL HCL 100 MG PO TABS
100.0000 mg | ORAL_TABLET | Freq: Two times a day (BID) | ORAL | Status: DC
  Administered 2011-04-17 – 2011-04-19 (×5): 100 mg via ORAL
  Filled 2011-04-17 (×7): qty 1

## 2011-04-17 MED ORDER — TETANUS-DIPHTH-ACELL PERTUSSIS 5-2.5-18.5 LF-MCG/0.5 IM SUSP
0.5000 mL | Freq: Once | INTRAMUSCULAR | Status: AC
  Administered 2011-04-18: 0.5 mL via INTRAMUSCULAR
  Filled 2011-04-17: qty 0.5

## 2011-04-17 MED ORDER — LACTATED RINGERS IV SOLN: 500.0000 mL | INTRAVENOUS | Status: DC | PRN

## 2011-04-17 MED ORDER — WITCH HAZEL-GLYCERIN EX PADS: MEDICATED_PAD | CUTANEOUS | Status: DC | PRN

## 2011-04-17 MED ORDER — MEASLES, MUMPS & RUBELLA VAC ~~LOC~~ INJ
0.5000 mL | INJECTION | Freq: Once | SUBCUTANEOUS | Status: DC
  Filled 2011-04-17: qty 0.5

## 2011-04-17 MED ORDER — SODIUM CHLORIDE 0.9 % IJ SOLN
3.0000 mL | Freq: Two times a day (BID) | INTRAMUSCULAR | Status: DC
  Administered 2011-04-17 (×2): 3 mL via INTRAVENOUS

## 2011-04-17 MED ORDER — SODIUM CHLORIDE 0.9 % IJ SOLN: 3.0000 mL | INTRAMUSCULAR | Status: DC | PRN

## 2011-04-17 NOTE — Progress Notes (Signed)
Pt to room 164 at this time.

## 2011-04-17 NOTE — H&P (Signed)
Chief Complaint:  Labor Eval   HPI:   Tamara Church is a 35 y.o. [id number]  with Estimated Date of Delivery: 04/29/11 by early ultrasound , now at  [redacted]w[redacted]d weeks gestation who presents with complaint of  contractions every 3 minutes lasting 60 seconds. Patient reports the fetal movement as active,  vaginal bleeding as none and  she describes fluid per vagina as Clear.  Other associated symptoms include fluid leakage.    [facility] and her [facility] course has been complicated by [facility].   Past Obstetrical History: OB History    Grav Para Term Preterm Abortions TAB SAB Ect Mult Living   6 5 5  0 0 0 0 0 0 5      Past Medical History: Past Medical History  Diagnosis Date  . Anemia   . Hypertension     Past Surgical History: No past surgical history on file.  Family History: No family history on file.  Social History: History  Substance Use Topics  . Smoking status: Never Smoker   . Smokeless tobacco: Never Used  . Alcohol Use: No    Allergies: No Known Allergies  Home Medications: Prescriptions prior to admission  Medication Sig Dispense Refill  . ferrous sulfate 325 (65 FE) MG tablet Take 1 tablet (325 mg total) by mouth daily with breakfast.  30 tablet  3  . labetalol (NORMODYNE) 100 MG tablet Take 100 mg by mouth 2 (two) times daily.        . prenatal vitamin w/FE, FA (PRENATAL 1 + 1) 27-1 MG TABS Take 1 tablet by mouth daily.          General ROS:  negative for HA, visual changes, RUQ pain Focused OB Physical: Cervical Exam: Dilation: 3.5 Effacement (%): 100 Cervical Position: Posterior Station: -3 Presentation: Vertex Exam by:: Lynnette, RN Fetal presentation: cephalic Membranes:intact, ruptured, clear fluid  External Fetal Monitoring:  Baseline: 140 bpm and contractions are regular, every 3 minuteswith moderate intensity.  Labs: Recent Results (from the past 24 hour(s))  POCT URINALYSIS DIP (DEVICE)   Collection Time   04/16/11   9:45 AM      Component Value Range   Glucose, UA NEGATIVE  NEGATIVE (mg/dL)   Bilirubin Urine NEGATIVE  NEGATIVE    Ketones, ur TRACE (*) NEGATIVE (mg/dL)   Specific Gravity, Urine 1.025  1.005 - 1.030    Hgb urine dipstick NEGATIVE  NEGATIVE    pH 6.5  5.0 - 8.0    Protein, ur 30 (*) NEGATIVE (mg/dL)   Urobilinogen, UA 0.2  0.0 - 1.0 (mg/dL)   Nitrite NEGATIVE  NEGATIVE    Leukocytes, UA NEGATIVE  NEGATIVE   CBC   Collection Time   04/16/11 11:10 AM      Component Value Range   WBC 8.1  4.0 - 10.5 (K/uL)   RBC 3.55 (*) 3.87 - 5.11 (MIL/uL)   Hemoglobin 10.4 (*) 12.0 - 15.0 (g/dL)   HCT 45.4 (*) 09.8 - 46.0 (%)   MCV 87.9  78.0 - 100.0 (fL)   MCH 29.3  26.0 - 34.0 (pg)   MCHC 33.3  30.0 - 36.0 (g/dL)   RDW 11.9  14.7 - 82.9 (%)   Platelets 159  150 - 400 (K/uL)  COMPREHENSIVE METABOLIC PANEL   Collection Time   04/16/11 11:10 AM      Component Value Range   Sodium 132 (*) 135 - 145 (mEq/L)   Potassium 4.0  3.5 -  5.1 (mEq/L)   Chloride 100  96 - 112 (mEq/L)   CO2 22  19 - 32 (mEq/L)   Glucose, Bld 82  70 - 99 (mg/dL)   BUN 6  6 - 23 (mg/dL)   Creatinine, Ser 1.61  0.50 - 1.10 (mg/dL)   Calcium 09.6 (*) 8.4 - 10.5 (mg/dL)   Total Protein 7.8  6.0 - 8.3 (g/dL)   Albumin 2.8 (*) 3.5 - 5.2 (g/dL)   AST 15  0 - 37 (U/L)   ALT 13  0 - 35 (U/L)   Alkaline Phosphatase 168 (*) 39 - 117 (U/L)   Total Bilirubin 0.2 (*) 0.3 - 1.2 (mg/dL)   GFR calc non Af Amer >60  >60 (mL/min)   GFR calc Af Amer >60  >60 (mL/min)  PROTEIN / CREATININE RATIO, URINE   Collection Time   04/16/11 11:10 AM      Component Value Range   Creatinine, Urine 99.26     Total Protein, Urine 16.1     PROTEIN CREATININE RATIO 0.16 (*) 0.00 - 0.15   CBC   Collection Time   04/17/11  6:20 AM      Component Value Range   WBC 8.7  4.0 - 10.5 (K/uL)   RBC 3.54 (*) 3.87 - 5.11 (MIL/uL)   Hemoglobin 10.3 (*) 12.0 - 15.0 (g/dL)   HCT 04.5 (*) 40.9 - 46.0 (%)   MCV 87.9  78.0 - 100.0 (fL)   MCH 29.1  26.0 - 34.0  (pg)   MCHC 33.1  30.0 - 36.0 (g/dL)   RDW 81.1  91.4 - 78.2 (%)   Platelets 164  150 - 400 (K/uL)    ASSESSMENT: [name] [id number] [redacted]w[redacted]d at weeks gestation  Active labor/SROM  PLAN: Expectant management  CRESENZO-DISHMAN,Toussaint Golson 04/17/2011,8:47 AM

## 2011-04-17 NOTE — Progress Notes (Signed)
Pt present with ROM and contractions

## 2011-04-17 NOTE — Progress Notes (Signed)
Called BS charge for bed.  Notified of multip with SROM. Unable to take report. Will call back.

## 2011-04-17 NOTE — Progress Notes (Signed)
UR Chart review completed.  

## 2011-04-17 NOTE — Progress Notes (Signed)
Report called to [facility] charge.  Pt may go to room 164.

## 2011-04-17 NOTE — Progress Notes (Signed)
Notified of grossly ruptured patient.  Orders to admit to labor and delivery.

## 2011-04-18 ENCOUNTER — Encounter (HOSPITAL_COMMUNITY): Payer: Self-pay

## 2011-04-18 MED ORDER — MEASLES, MUMPS & RUBELLA VAC ~~LOC~~ INJ
0.5000 mL | INJECTION | Freq: Once | SUBCUTANEOUS | Status: AC
  Administered 2011-04-19: 0.5 mL via SUBCUTANEOUS
  Filled 2011-04-18: qty 0.5

## 2011-04-18 NOTE — Progress Notes (Signed)
Post Partum Day 1 Subjective: no complaints, up ad lib, voiding, tolerating PO and bottlefeeding only.  Pt undecided regarding BTL, considering IUD postpartum.  Objective: Blood pressure 126/86, pulse 89, temperature 97.4 F (36.3 C), temperature source Oral, resp. rate 20, height 5\' 1"  (1.549 m), weight 92.987 kg (205 lb), last menstrual period 08/10/2010, SpO2 99.00%, unknown if currently breastfeeding.  Physical Exam:  General: alert and cooperative CVS:  Regular rate and rhythm; no murmurs, gallops, or rubs. Lungs:  CTA bilat Lochia: appropriate Uterine Fundus: firm, 1 below umbilicus DVT Evaluation: No evidence of DVT seen on physical exam. Negative Homan's sign.   Basename 04/17/11 0620 04/16/11 1110  HGB 10.3* 10.4*  HCT 31.1* 31.2*    Assessment/Plan: Plan for discharge tomorrow and Contraception discuss with Dr. Orvan Falconer and then will decide.   LOS: 1 day   [facility] 04/18/2011, 7:47 AM

## 2011-04-19 MED ORDER — IBUPROFEN 600 MG PO TABS: 600.0000 mg | ORAL_TABLET | Freq: Four times a day (QID) | ORAL | Status: AC

## 2011-04-19 MED ORDER — DOCUSATE SODIUM 100 MG PO CAPS: 100.0000 mg | ORAL_CAPSULE | Freq: Two times a day (BID) | ORAL | Status: AC | PRN

## 2011-04-19 NOTE — Discharge Summary (Signed)
Obstetric Discharge Summary Reason for Admission: onset of labor Prenatal Procedures: NST Intrapartum Procedures: spontaneous vaginal delivery Postpartum Procedures: none Complications-Operative and Postpartum: none  Hemoglobin  Date Value Range Status  04/17/2011 10.3* 12.0-15.0 (g/dL) Final     HCT  Date Value Range Status  04/17/2011 31.1* 36.0-46.0 (%) Final    Discharge Diagnoses: Term Pregnancy-delivered and chronic hypertension  Discharge Information: Date: 04/19/2011 Activity: pelvic rest Diet: routine Medications: PNV, Ibuprophen, Colace, Iron and labetalol Condition: stable Instructions: refer to practice specific booklet Discharge to: home Follow-up Information    Follow up with [facility]. Make an appointment in 4 weeks. (postpartum check with IUD placement at 6 weeks.)    Contact information:   [address] [address]          Newborn Data: Live born  Information for the patient's newborn:  Aubrynn, Katona [[id number]]  female ; APGAR 8/9 ; weight 6lbs 13.3 oz;  Home with mother.  Macklin Jacquin 04/19/2011, 6:40 AM

## 2011-04-19 NOTE — Progress Notes (Signed)
Post Partum Day 2 Subjective: no complaints, voiding and tolerating PO, thin lochia, present BM, present flatus, plans to bottle feed, IUD  Objective: Blood pressure 124/85, pulse 93, temperature 98.5 F (36.9 C), temperature source Oral, resp. rate 18, height 5\' 1"  (1.549 m), weight 92.987 kg (205 lb), last menstrual period 08/10/2010, SpO2 96.00%, unknown if currently breastfeeding.  Physical Exam:  General: alert and cooperative Lochia: appropriate Chest: CTAB Heart: RRR no m/r/g Abdomen: +BS, soft, nontender,  Uterine Fundus: firm @ umbilicus DVT Evaluation: No evidence of DVT seen on physical exam. Extremities: no c/c/e   Basename 04/17/11 0620 04/16/11 1110  HGB 10.3* 10.4*  HCT 31.1* 31.2*    Assessment/Plan: Discharge home   LOS: 2 days   Tamara Church 04/19/2011, 6:31 AM

## 2011-04-20 ENCOUNTER — Other Ambulatory Visit: Payer: Medicaid Other

## 2011-04-22 ENCOUNTER — Ambulatory Visit (INDEPENDENT_AMBULATORY_CARE_PROVIDER_SITE_OTHER): Payer: Medicaid Other | Admitting: Family Medicine

## 2011-04-22 ENCOUNTER — Inpatient Hospital Stay (HOSPITAL_COMMUNITY)
Admission: RE | Admit: 2011-04-22 | Payer: Medicaid Other | Source: Ambulatory Visit | Admitting: Obstetrics & Gynecology

## 2011-04-22 VITALS — BP 157/106 | HR 80 | Temp 98.1°F | Wt 199.0 lb

## 2011-04-22 DIAGNOSIS — I10 Essential (primary) hypertension: Secondary | ICD-10-CM

## 2011-04-22 DIAGNOSIS — N6459 Other signs and symptoms in breast: Secondary | ICD-10-CM | POA: Insufficient documentation

## 2011-04-22 NOTE — Assessment & Plan Note (Signed)
Continue conservative management. Expect pain to improve next 7-10 days as milk ducts dry-up. Asked to take Tylenol scheduled, ibuprofen for breakthrough. Warm/cold compresses. Given red flags for mastitis/abscess.

## 2011-04-22 NOTE — Patient Instructions (Signed)
Try taking tylenol 650mg  every 6 hours (4x daily) for the next several days and take ibuprofen 600mg  up to 6 times daily for breakthrough pain.  Try warm compresses and tea bags.   If you see any worsening redness especially over nipples, have worsening pain, and fevers (temperature >100.4), come back to the clinic.

## 2011-04-22 NOTE — Progress Notes (Signed)
  Subjective:    Patient ID: Tamara Church, female    DOB: 01-01-1976, 35 y.o.   MRN: [id number]  HPI Breast engorgement. Delivered 6th baby on July 26.  Since then, worsening breast engorgement. Now wearing 2 bras, back pain. Breasts fele swollen and hard. Concerned about areas of redness. Minimal relief with ibuprofen, taking 4 tablets a day at most.  Bottlefeeding exclusively.  Review of Systems Denies fever, chills, nausea/vomiting.    Objective:   Physical Exam General: somewhat uncomfortable appearing when ambulating Breasts: significantly engorged with spots of milk at nipples; mild discrete areas of erythema but no significant areas of erythema. Skin closed. Wearing 2 bras, 1 tight tank top, and tight T-shirt. CV: RRR Pulm: CTAB, no rales Abd: soft, NT, obese, NABS Ext: mild pedal edema, non-pitting    Assessment & Plan:

## 2011-04-22 NOTE — Progress Notes (Signed)
  Subjective:    Patient ID: Tamara Church, female    DOB: May 10, 1976, 35 y.o.   MRN: [id number]  HPI Did not take blood pressure medication this morning.  Review of Systems Denies chest pain, dypsnea, dizziness.     Objective:   Physical Exam        Assessment & Plan:

## 2011-04-22 NOTE — Assessment & Plan Note (Signed)
Blood pressure elevated today but did not take this morning. But this elevation not different from her BP at her last visit in February. Asked patient to take her medication as directed before her post-partum visit in 6 weeks, so that this issue could be more accurately addressed.

## 2011-05-27 ENCOUNTER — Ambulatory Visit (INDEPENDENT_AMBULATORY_CARE_PROVIDER_SITE_OTHER): Payer: Medicaid Other | Admitting: Obstetrics & Gynecology

## 2011-05-27 ENCOUNTER — Encounter: Payer: Self-pay | Admitting: Obstetrics & Gynecology

## 2011-05-27 MED ORDER — LABETALOL HCL 200 MG PO TABS: 200.0000 mg | ORAL_TABLET | Freq: Two times a day (BID) | ORAL | Status: DC

## 2011-05-27 NOTE — Patient Instructions (Signed)
What is this medicine? LEVONORGESTREL IUD (LEE voe nor jes trel) is a contraceptive (birth control) device. It is used to prevent pregnancy and to treat heavy bleeding that occurs during your period. It can be used for up to 5 years. This medicine may be used for other purposes; ask your health care provider or pharmacist if you have questions.   What should I tell my health care provider before I take this medicine? They need to know if you have any of these conditions: -abnormal Pap smear -cancer of the breast, uterus, or cervix -diabetes -endometritis -genital or pelvic infection now or in the past -have more than one sexual partner or your partner has more than one partner -heart disease -history of an ectopic or tubal pregnancy -immune system problems -IUD in place -liver disease or tumor -problems with blood clots or take blood-thinners -use intravenous drugs -uterus of unusual shape -vaginal bleeding that has not been explained -an unusual or allergic reaction to levonorgestrel, other hormones, silicone, or polyethylene, medicines, foods, dyes, or preservatives -pregnant or trying to get pregnant -breast-feeding   How should I use this medicine? This device is placed inside the uterus by a health care professional. Talk to your pediatrician regarding the use of this medicine in children. Special care may be needed. Overdosage: If you think you have taken too much of this medicine contact a poison control center or emergency room at once. NOTE: This medicine is only for you. Do not share this medicine with others.   What if I miss a dose? This does not apply.   What may interact with this medicine? Do not take this medicine with any of the following medications: -amprenavir -bosentan -fosamprenavir   This medicine may also interact with the following medications: -aprepitant -barbiturate medicines for inducing sleep or treating  seizures -bexarotene -griseofulvin -medicines to treat seizures like carbamazepine, ethotoin, felbamate, oxcarbazepine, phenytoin, topiramate -modafinil -pioglitazone -rifabutin -rifampin -rifapentine -some medicines to treat HIV infection like atazanavir, indinavir, lopinavir, nelfinavir, tipranavir, ritonavir -St. John's wort -warfarin   This list may not describe all possible interactions. Give your health care provider a list of all the medicines, herbs, non-prescription drugs, or dietary supplements you use. Also tell them if you smoke, drink alcohol, or use illegal drugs. Some items may interact with your medicine.   What should I watch for while using this medicine? Visit your doctor or health care professional for regular check ups. See your doctor if you or your partner has sexual contact with others, becomes HIV positive, or gets a sexual transmitted disease.   This product does not protect you against HIV infection (AIDS) or other sexually transmitted diseases.   You can check the placement of the IUD yourself by reaching up to the top of your vagina with clean fingers to feel the threads. Do not pull on the threads. It is a good habit to check placement after each menstrual period. Call your doctor right away if you feel more of the IUD than just the threads or if you cannot feel the threads at all.   The IUD may come out by itself. You may become pregnant if the device comes out. If you notice that the IUD has come out use a backup birth control method like condoms and call your health care provider.   Using tampons will not change the position of the IUD and are okay to use during your period.   What side effects may I notice from receiving this   medicine? Side effects that you should report to your doctor or health care professional as soon as possible: -allergic reactions like skin rash, itching or hives, swelling of the face, lips, or tongue -fever, flu-like  symptoms -genital sores -high blood pressure -no menstrual period for 6 weeks during use -pain, swelling, warmth in the leg -pelvic pain or tenderness -severe or sudden headache -signs of pregnancy -stomach cramping -sudden shortness of breath -trouble with balance, talking, or walking -unusual vaginal bleeding, discharge -yellowing of the eyes or skin   Side effects that usually do not require medical attention (report to your doctor or health care professional if they continue or are bothersome): -acne -breast pain -change in sex drive or performance -changes in weight -cramping, dizziness, or faintness while the device is being inserted -headache -irregular menstrual bleeding within first 3 to 6 months of use -nausea   This list may not describe all possible side effects. Call your doctor for medical advice about side effects. You may report side effects to FDA at 1-800-FDA-1088.   Where should I keep my medicine? This does not apply.   NOTE:This sheet is a summary. It may not cover all possible information. If you have questions about this medicine, talk to your doctor, pharmacist, or health care provider.      2011, Elsevier/Gold Standard.   

## 2011-05-27 NOTE — Progress Notes (Signed)
  Subjective:    Patient ID: Tamara Church, female    DOB: [birth year], 35 y.o.   MRN: [id number]  HPI    Review of Systems     Objective:   Physical Exam        Assessment & Plan:   Subjective:     Tamara Church is a 35 y.o. female who presents for a postpartum visit. She is 4 week postpartum following a spontaneous vaginal delivery. I have fully reviewed the prenatal and intrapartum course. The delivery was at 38 gestational weeks. Outcome: spontaneous vaginal delivery. Anesthesia: epidural. Postpartum course has been uneventful. Baby's course has been normal. Baby is feeding by bottle - not recorded. Bleeding no bleeding. Bowel function is normal. Bladder function is normal. Patient is not sexually active. Contraception method is IUD. Postpartum depression screening: negative.  Chronic hypertension Past Medical History  Diagnosis Date  . Anemia   . Hypertension    Current Outpatient Prescriptions on File Prior to Visit  Medication Sig Dispense Refill  . ferrous sulfate 325 (65 FE) MG tablet Take 1 tablet (325 mg total) by mouth daily with breakfast.  30 tablet  3  . prenatal vitamin w/FE, FA (PRENATAL 1 + 1) 27-1 MG TABS Take 1 tablet by mouth daily.         No Known Allergies History reviewed. No pertinent past surgical history.  Review of Systems Pertinent items are noted in HPI.   Objective:    BP 140/94  Pulse 74  Temp(Src) 97.5 F (36.4 C) (Oral)  Resp 20  Ht 5\' 2"  (1.575 m)  Wt 192 lb 4.8 oz (87.227 kg)  BMI 35.17 kg/m2  General:  alert, cooperative and no distress   Breasts:  not done  Lungs: deferred  Heart:  deferred  Abdomen: normal findings: no masses palpable and soft, non-tender   Vulva:  normal  Vagina: normal vagina  Cervix:  normal  Corpus: normal size, contour, position, consistency, mobility, non-tender  Adnexa:  normal adnexa  Rectal Exam: Not performed.        Assessment:    Normal postpartum exam. Pap smear not done at today's  visit.   Plan:    1. Contraception: IUD 2. Hypertension, increase to 200mg  labetalol BID, f/u at [facility] 3. Follow up in: 2 week or as needed. Mirena insertion

## 2011-06-02 ENCOUNTER — Encounter: Payer: Self-pay | Admitting: Family Medicine

## 2011-06-02 ENCOUNTER — Ambulatory Visit (INDEPENDENT_AMBULATORY_CARE_PROVIDER_SITE_OTHER): Payer: Medicaid Other | Admitting: Family Medicine

## 2011-06-02 VITALS — BP 167/106 | HR 65 | Temp 98.9°F | Ht 62.0 in | Wt 190.3 lb

## 2011-06-02 DIAGNOSIS — Z975 Presence of (intrauterine) contraceptive device: Secondary | ICD-10-CM

## 2011-06-02 DIAGNOSIS — IMO0001 Reserved for inherently not codable concepts without codable children: Secondary | ICD-10-CM

## 2011-06-02 DIAGNOSIS — Z309 Encounter for contraceptive management, unspecified: Secondary | ICD-10-CM

## 2011-06-02 DIAGNOSIS — Z30430 Encounter for insertion of intrauterine contraceptive device: Secondary | ICD-10-CM

## 2011-06-02 DIAGNOSIS — I10 Essential (primary) hypertension: Secondary | ICD-10-CM

## 2011-06-02 MED ORDER — TRIAMTERENE-HCTZ 37.5-25 MG PO TABS: 1.0000 | ORAL_TABLET | Freq: Every day | ORAL | Status: DC

## 2011-06-02 MED ORDER — LEVONORGESTREL 20 MCG/24HR IU IUD
INTRAUTERINE_SYSTEM | Freq: Once | INTRAUTERINE | Status: AC
  Administered 2011-06-02: 17:00:00 via INTRAUTERINE

## 2011-06-02 NOTE — Patient Instructions (Signed)
Please recheck your strings after your next period to make sure strings are in place. If you do not feel strings, please return to clinic and we will check them for you. Please pick up prescription at your local pharmacy. Good to see you again.

## 2011-06-08 DIAGNOSIS — Z30430 Encounter for insertion of intrauterine contraceptive device: Secondary | ICD-10-CM | POA: Insufficient documentation

## 2011-06-08 NOTE — Assessment & Plan Note (Signed)
Precepted to Dr. Jennette Kettle who allowed me to assist with the IUD insertion. IUD successfully placed - strings visualized.  Patient tolerated procedure well. Advised patient to recheck strings after next period - if she does not feel them, patient is to return to clinic. Patient agreed with plan.

## 2011-06-08 NOTE — Progress Notes (Signed)
  Subjective:    Patient ID: Tamara Church, female    DOB: Sep 28, 1975, 35 y.o.   MRN: [id number]  HPI  Patient presents to clinic for IUD insertion.  Patient is over 6 weeks post-partum.  Urine pregnancy test - negative.  She has no concerns or questions, except she is currently in a menstrual cycle and is bleeding.  She complains of mild cramps.  I reviewed risks/benefits and obtained informed consent.   Needs Rx for anti-hypertensive medications now that is not pregnant anymore.  BP currently elevated, but patient denies any changes in vision, headache, chest pain, fatigue, weakness, N/V, or numbness/tingling.  Review of Systems  Per HPI    Objective:   Physical Exam  Genitourinary: Cervix exhibits no motion tenderness, no discharge and no friability. Right adnexum displays no mass, no tenderness and no fullness. Left adnexum displays no mass, no tenderness and no fullness.  Vaginal bleeding secondary to menstrual cycle.  Strings visualized after IUD insertion.        Assessment & Plan:

## 2011-06-08 NOTE — Assessment & Plan Note (Addendum)
Will refill anti-hypertensive medications - Maxzide 25 mg po daily. Follow up in 4 months to recheck BP.

## 2011-06-17 NOTE — Telephone Encounter (Signed)
This encounter was created in error - please disregard.

## 2011-06-22 ENCOUNTER — Encounter: Payer: Self-pay | Admitting: Family Medicine

## 2011-06-22 ENCOUNTER — Ambulatory Visit: Payer: Medicaid Other | Admitting: Family Medicine

## 2011-07-08 ENCOUNTER — Encounter (HOSPITAL_COMMUNITY): Payer: Self-pay

## 2011-07-08 ENCOUNTER — Inpatient Hospital Stay (HOSPITAL_COMMUNITY)
Admission: AD | Admit: 2011-07-08 | Discharge: 2011-07-08 | Disposition: A | Discharge status: Home / Self Care | Payer: Medicaid Other | Source: Ambulatory Visit | Attending: Obstetrics & Gynecology | Admitting: Obstetrics & Gynecology

## 2011-07-08 DIAGNOSIS — N760 Acute vaginitis: Secondary | ICD-10-CM

## 2011-07-08 LAB — WET PREP, GENITAL: Yeast Wet Prep HPF POC: NONE SEEN

## 2011-07-08 MED ORDER — METRONIDAZOLE 500 MG PO TABS: 500.0000 mg | ORAL_TABLET | Freq: Two times a day (BID) | ORAL | Status: AC

## 2011-07-08 NOTE — Progress Notes (Signed)
Pt state she is having vaginal irritation and burning. Is at the end of her period and has a slight bleeding. Had had baby almost three months ago and had the Mirena put in at six week check up.

## 2011-07-08 NOTE — ED Provider Notes (Signed)
History   Pt presents today c/o vaginal irritation for the past several days. She states she used a new kind of tampon and has been irritated ever since. She denies fever, abd pain, or any other sx at this time.  Chief Complaint  Patient presents with  . Vaginal Pain   HPI  OB History    Grav Para Term Preterm Abortions TAB SAB Ect Mult Living   6 6 6  0 0 0 0 0 0 6      Past Medical History  Diagnosis Date  . Anemia   . Hypertension     History reviewed. No pertinent past surgical history.  History reviewed. No pertinent family history.  History  Substance Use Topics  . Smoking status: Never Smoker   . Smokeless tobacco: Never Used  . Alcohol Use: No    Allergies: No Known Allergies  Prescriptions prior to admission  Medication Sig Dispense Refill  . ferrous sulfate 325 (65 FE) MG tablet Take 1 tablet (325 mg total) by mouth daily with breakfast.  30 tablet  3  . labetalol (NORMODYNE) 200 MG tablet Take 1 tablet (200 mg total) by mouth 2 (two) times daily.  30 tablet  1  . prenatal vitamin w/FE, FA (PRENATAL 1 + 1) 27-1 MG TABS Take 1 tablet by mouth daily.        Marland Kitchen triamterene-hydrochlorothiazide (MAXZIDE-25) 37.5-25 MG per tablet Take 1 each (1 tablet total) by mouth daily.  31 tablet  5    Review of Systems  Constitutional: Negative for fever.  Cardiovascular: Negative for chest pain.  Gastrointestinal: Negative for nausea, vomiting, abdominal pain, diarrhea and constipation.  Genitourinary: Negative for dysuria, urgency, frequency and hematuria.  Neurological: Negative for dizziness and headaches.  Psychiatric/Behavioral: Negative for depression and suicidal ideas.   Physical Exam   Blood pressure 137/103, pulse 68, temperature 98.7 F (37.1 C), temperature source Oral, resp. rate 16, height 5\' 1"  (1.549 m), weight 193 lb 3.2 oz (87.635 kg), last menstrual period 07/03/2011, SpO2 99.00%.  Physical Exam  Nursing note and vitals reviewed. Constitutional: She  is oriented to person, place, and time. She appears well-developed and well-nourished. No distress.  HENT:  Head: Normocephalic and atraumatic.  Eyes: EOM are normal. Pupils are equal, round, and reactive to light.  GI: Soft. She exhibits no distension. There is no tenderness. There is no rebound and no guarding.  Genitourinary: There is bleeding around the vagina. Vaginal discharge found.  Neurological: She is alert and oriented to person, place, and time.  Skin: Skin is warm and dry. She is not diaphoretic.  Psychiatric: She has a normal mood and affect. Her behavior is normal. Judgment and thought content normal.    MAU Course  Procedures  Wet prep and GC/Chlamydia cultures done.  Results for orders placed during the hospital encounter of 07/08/11 (from the past 24 hour(s))  WET PREP, GENITAL     Status: Abnormal   Collection Time   07/08/11  1:59 PM      Component Value Range   Yeast, Wet Prep NONE SEEN  NONE SEEN    Trich, Wet Prep NONE SEEN  NONE SEEN    Clue Cells, Wet Prep NONE SEEN  NONE SEEN    WBC, Wet Prep HPF POC FEW (*) NONE SEEN     Assessment and Plan  Vaginitis: discussed with pt at length. Will prophylactically tx with Flagyl. Warned of antabuse reaction. Discussed diet, activity, risks, and precautions.  Clinton Gallant. Demaria Deeney  III, DrHSc, MPAS, PA-C  07/08/2011, 2:00 PM   Henrietta Hoover, PA 07/08/11 1418

## 2011-07-09 LAB — GC/CHLAMYDIA PROBE AMP, GENITAL
Chlamydia, DNA Probe: NEGATIVE
GC Probe Amp, Genital: NEGATIVE

## 2011-07-09 NOTE — ED Provider Notes (Signed)
Attestation of Attending Supervision of Advanced Practitioner: Evaluation and management procedures were performed by the PA/NP/CNM/OB Fellow under my supervision/collaboration. Chart reviewed and agree with management and plan.  Tamara Church,Tamara Church 07/09/2011 2:50 PM

## 2011-09-19 ENCOUNTER — Emergency Department (INDEPENDENT_AMBULATORY_CARE_PROVIDER_SITE_OTHER)
Admission: EM | Admit: 2011-09-19 | Discharge: 2011-09-19 | Disposition: A | Discharge status: Home / Self Care | Payer: Medicaid Other | Source: Home / Self Care | Attending: Emergency Medicine | Admitting: Emergency Medicine

## 2011-09-19 ENCOUNTER — Encounter (HOSPITAL_COMMUNITY): Payer: Self-pay | Admitting: Emergency Medicine

## 2011-09-19 DIAGNOSIS — R6889 Other general symptoms and signs: Secondary | ICD-10-CM

## 2011-09-19 MED ORDER — OSELTAMIVIR PHOSPHATE 75 MG PO CAPS: 75.0000 mg | ORAL_CAPSULE | Freq: Two times a day (BID) | ORAL | Status: AC

## 2011-09-19 MED ORDER — GUAIFENESIN-CODEINE 100-10 MG/5ML PO SYRP: 10.0000 mL | ORAL_SOLUTION | Freq: Four times a day (QID) | ORAL | Status: DC | PRN

## 2011-09-19 MED ORDER — BENZONATATE 200 MG PO CAPS: 200.0000 mg | ORAL_CAPSULE | Freq: Three times a day (TID) | ORAL | Status: AC | PRN

## 2011-09-19 NOTE — ED Notes (Signed)
Sore throat and fever onset yesterday.  C/o headache and general aches and pain, stuffy nose

## 2011-09-19 NOTE — ED Provider Notes (Signed)
History     CSN: [id number]  Arrival date & time 09/19/11  1147   First MD Initiated Contact with Patient 09/19/11 1432      Chief Complaint  Patient presents with  . Sore Throat    (Consider location/radiation/quality/duration/timing/severity/associated sxs/prior treatment) HPI Comments: Tamara Church is a 35 year old female who has a history since yesterday of sore throat, nasal congestion with yellow drainage, fever, chills, sweats, aches, headache, dry cough, abdominal pain. She did get a flu vaccine. She has been exposed to influenza.  Patient is a 35 y.o. female presenting with pharyngitis.  Sore Throat Associated symptoms include abdominal pain and headaches. Pertinent negatives include no shortness of breath.    Past Medical History  Diagnosis Date  . Anemia   . Hypertension     History reviewed. No pertinent past surgical history.  History reviewed. No pertinent family history.  History  Substance Use Topics  . Smoking status: Never Smoker   . Smokeless tobacco: Never Used  . Alcohol Use: No    OB History    Grav Para Term Preterm Abortions TAB SAB Ect Mult Living   6 6 6  0 0 0 0 0 0 6      Review of Systems  Constitutional: Positive for fever, chills, diaphoresis and fatigue.  HENT: Positive for congestion, sore throat and rhinorrhea. Negative for ear pain, sneezing, neck stiffness, voice change and postnasal drip.   Eyes: Negative for pain, discharge and redness.  Respiratory: Positive for cough. Negative for chest tightness, shortness of breath and wheezing.   Gastrointestinal: Positive for abdominal pain. Negative for nausea, vomiting and diarrhea.  Skin: Negative for rash.  Neurological: Positive for headaches.    Allergies  Review of patient's allergies indicates no known allergies.  Home Medications   Current Outpatient Rx  Name Route Sig Dispense Refill  . OVER THE COUNTER MEDICATION  New blood pressure medicine started about 3-4 weeks ago      . BENZONATATE 200 MG PO CAPS Oral Take 1 capsule (200 mg total) by mouth 3 (three) times daily as needed for cough. 30 capsule 0  . FERROUS SULFATE 325 (65 FE) MG PO TABS Oral Take 1 tablet (325 mg total) by mouth daily with breakfast. 30 tablet 3  . LABETALOL HCL 200 MG PO TABS Oral Take 1 tablet (200 mg total) by mouth 2 (two) times daily. 30 tablet 1  . OSELTAMIVIR PHOSPHATE 75 MG PO CAPS Oral Take 1 capsule (75 mg total) by mouth every 12 (twelve) hours. 10 capsule 0  . PRENATAL PLUS 27-1 MG PO TABS Oral Take 1 tablet by mouth daily.      . TRIAMTERENE-HCTZ 37.5-25 MG PO TABS Oral Take 1 each (1 tablet total) by mouth daily. 31 tablet 5    BP 154/98  Pulse 96  Temp(Src) 97.9 F (36.6 C) (Oral)  Resp 24  SpO2 97%  LMP 09/12/2011  Physical Exam  Nursing note and vitals reviewed. Constitutional: She appears well-developed and well-nourished. No distress.  HENT:  Head: Normocephalic and atraumatic.  Right Ear: External ear normal.  Left Ear: External ear normal.  Nose: Nose normal.  Mouth/Throat: Oropharynx is clear and moist. No oropharyngeal exudate.  Eyes: Conjunctivae and EOM are normal. Pupils are equal, round, and reactive to light. Right eye exhibits no discharge. Left eye exhibits no discharge.  Neck: Normal range of motion. Neck supple.  Cardiovascular: Normal rate, regular rhythm and normal heart sounds.   Pulmonary/Chest: Effort normal and breath sounds  normal. No stridor. No respiratory distress. She has no wheezes. She has no rales. She exhibits no tenderness.  Lymphadenopathy:    She has no cervical adenopathy.  Skin: Skin is warm and dry. No rash noted. She is not diaphoretic.    ED Course  Procedures (including critical care time)  Labs Reviewed - No data to display No results found.   1. Influenza-like illness       MDM  She has had influenza-like illness. Since his early on in the course, we'll go ahead and treat with Tamiflu. She was told to  return if she is no better in 3 or 4 days or sooner if she should become worse in any way.        Roque Lias, MD 09/19/11 [phone]

## 2011-12-30 ENCOUNTER — Emergency Department (HOSPITAL_COMMUNITY)
Admission: EM | Admit: 2011-12-30 | Discharge: 2011-12-30 | Disposition: A | Discharge status: Home / Self Care | Payer: Self-pay | Attending: Emergency Medicine | Admitting: Emergency Medicine

## 2011-12-30 ENCOUNTER — Encounter (HOSPITAL_COMMUNITY): Payer: Self-pay | Admitting: *Deleted

## 2011-12-30 DIAGNOSIS — R51 Headache: Secondary | ICD-10-CM | POA: Insufficient documentation

## 2011-12-30 DIAGNOSIS — I10 Essential (primary) hypertension: Secondary | ICD-10-CM | POA: Insufficient documentation

## 2011-12-30 NOTE — ED Notes (Signed)
Called x1 and no response.

## 2011-12-30 NOTE — ED Notes (Signed)
PT reports a HA that started 2 days ago. Today pt reports pain is located over LT ear and radiates into Lt shoulder. Pt denies N/V or blurred vision. Pt does have a HX of HTN. B/P elevated.

## 2012-01-12 ENCOUNTER — Emergency Department (HOSPITAL_COMMUNITY): Payer: Self-pay

## 2012-01-12 ENCOUNTER — Emergency Department (HOSPITAL_COMMUNITY)
Admission: EM | Admit: 2012-01-12 | Discharge: 2012-01-13 | Disposition: A | Discharge status: Home / Self Care | Payer: Self-pay | Attending: Emergency Medicine | Admitting: Emergency Medicine

## 2012-01-12 ENCOUNTER — Encounter (HOSPITAL_COMMUNITY): Payer: Self-pay | Admitting: Emergency Medicine

## 2012-01-12 ENCOUNTER — Emergency Department (INDEPENDENT_AMBULATORY_CARE_PROVIDER_SITE_OTHER)
Admission: EM | Admit: 2012-01-12 | Discharge: 2012-01-12 | Disposition: A | Discharge status: Nursing Home (Includes ICF) | Payer: Self-pay | Source: Home / Self Care | Attending: Emergency Medicine | Admitting: Emergency Medicine

## 2012-01-12 ENCOUNTER — Telehealth: Payer: Self-pay | Admitting: *Deleted

## 2012-01-12 DIAGNOSIS — I10 Essential (primary) hypertension: Secondary | ICD-10-CM | POA: Insufficient documentation

## 2012-01-12 DIAGNOSIS — R42 Dizziness and giddiness: Secondary | ICD-10-CM

## 2012-01-12 DIAGNOSIS — R51 Headache: Secondary | ICD-10-CM | POA: Insufficient documentation

## 2012-01-12 DIAGNOSIS — N179 Acute kidney failure, unspecified: Secondary | ICD-10-CM

## 2012-01-12 LAB — URINALYSIS, ROUTINE W REFLEX MICROSCOPIC
Bilirubin Urine: NEGATIVE
Glucose, UA: NEGATIVE mg/dL
Ketones, ur: NEGATIVE mg/dL
Protein, ur: 30 mg/dL — AB

## 2012-01-12 LAB — POCT I-STAT, CHEM 8
BUN: 13 mg/dL (ref 6–23)
Calcium, Ion: 1.26 mmol/L (ref 1.12–1.32)
Creatinine, Ser: 1.4 mg/dL — ABNORMAL HIGH (ref 0.50–1.10)
TCO2: 29 mmol/L (ref 0–100)

## 2012-01-12 LAB — CBC
Hemoglobin: 11.9 g/dL — ABNORMAL LOW (ref 12.0–15.0)
MCH: 29.6 pg (ref 26.0–34.0)
MCV: 86.6 fL (ref 78.0–100.0)
Platelets: 266 10*3/uL (ref 150–400)
RBC: 4.02 MIL/uL (ref 3.87–5.11)

## 2012-01-12 LAB — BASIC METABOLIC PANEL
CO2: 28 mEq/L (ref 19–32)
Calcium: 10.2 mg/dL (ref 8.4–10.5)
Creatinine, Ser: 1.29 mg/dL — ABNORMAL HIGH (ref 0.50–1.10)
Glucose, Bld: 96 mg/dL (ref 70–99)

## 2012-01-12 LAB — POCT I-STAT TROPONIN I

## 2012-01-12 LAB — URINE MICROSCOPIC-ADD ON

## 2012-01-12 MED ORDER — CLONIDINE HCL 0.1 MG PO TABS
0.1000 mg | ORAL_TABLET | Freq: Once | ORAL | Status: AC
  Administered 2012-01-12: 0.1 mg via ORAL
  Filled 2012-01-12: qty 1

## 2012-01-12 MED ORDER — METOPROLOL TARTRATE 25 MG PO TABS: 25.0000 mg | ORAL_TABLET | Freq: Two times a day (BID) | ORAL | Status: DC

## 2012-01-12 MED ORDER — METOPROLOL TARTRATE 25 MG PO TABS
25.0000 mg | ORAL_TABLET | Freq: Once | ORAL | Status: AC
  Administered 2012-01-12: 25 mg via ORAL
  Filled 2012-01-12: qty 1

## 2012-01-12 NOTE — ED Notes (Signed)
Pt. Stated, My BP has been running high, especially the last 3 days. I've had a headache.

## 2012-01-12 NOTE — ED Notes (Addendum)
Patient transported to CT 

## 2012-01-12 NOTE — ED Provider Notes (Signed)
History     CSN: [id number]  Arrival date & time 01/12/12  1743   First MD Initiated Contact with Patient 01/12/12 1855      Chief Complaint  Patient presents with  . Hypertension    (Consider location/radiation/quality/duration/timing/severity/associated sxs/prior treatment) HPI Comments: Patient with history of hypertension presents with 3 days of intermittent, gradual onset, throbbing right sided headaches, and multiple episodes of dizziness described as vertigo lasting up to 30 minutes at a time. Patient denies any nausea, diaphoresis, palpitations while vertiginous. Vertigo is not associated with turning her head, or change in her body position.  No aggravating factors for her vertigo. Symptoms slightly better when she sits down and rests. No tinnitus, visual changes. She's been taking Tylenol for headaches with no relief. Patient states her blood pressure has been high during this time, ranging in the 170s-190s/120s over multiple blood pressure readings. Patient denies any change in her medications, illgeal drug use, use of decongestants. States she is taking her medications as prescribed.  No dysarthria, arm or leg weakness. No chest pain, shortness of breath, abdominal pain. No anuria, hematuria, lower extremity swelling. She's currently on Maxide. Has not been on labetalol for over a year-this was prescribed to her during pregnancy. Patient has no history of stroke or vertigo. patient did call the family practice Center earlier today. Blood pressure was 176/128, repeat reading was 128/98. Patient was advised to come in urgent care. Blood pressure in previous visits 154/98, 137/103.    ROS as noted in HPI. All other ROS negative.   Patient is a 36 y.o. female presenting with headaches. The history is provided by the patient. No language interpreter was used.  Headache The primary symptoms include headaches. The symptoms began 3 to 5 days ago. The symptoms are unchanged.  The headache  is not associated with aura, photophobia, neck stiffness or weakness.  Additional symptoms include vertigo. Additional symptoms do not include neck stiffness, weakness, photophobia, aura, nystagmus, hearing loss or tinnitus. Medical issues also include hypertension. Medical issues do not include seizures, cerebral vascular accident, drug use or diabetes.    Past Medical History  Diagnosis Date  . Anemia   . Hypertension     History reviewed. No pertinent past surgical history.  History reviewed. No pertinent family history.  History  Substance Use Topics  . Smoking status: Never Smoker   . Smokeless tobacco: Never Used  . Alcohol Use: No    OB History    Grav Para Term Preterm Abortions TAB SAB Ect Mult Living   6 6 6  0 0 0 0 0 0 6      Review of Systems  HENT: Negative for hearing loss, neck stiffness and tinnitus.   Eyes: Negative for photophobia.  Neurological: Positive for vertigo and headaches. Negative for weakness.    Allergies  Review of patient's allergies indicates no known allergies.  Home Medications   Current Outpatient Rx  Name Route Sig Dispense Refill  . LABETALOL HCL 200 MG PO TABS Oral Take 200 mg by mouth 2 (two) times daily.    . TRIAMTERENE-HCTZ 37.5-25 MG PO TABS Oral Take 1 tablet by mouth daily.      BP 194/130  Pulse 90  Temp(Src) 99.1 F (37.3 C) (Oral)  Resp 20  SpO2 98%  LMP 01/10/2012  Physical Exam  Nursing note and vitals reviewed. Constitutional: She is oriented to person, place, and time. She appears well-developed and well-nourished.       Appears ill  HENT:  Head: Normocephalic and atraumatic.  Right Ear: Tympanic membrane normal.  Left Ear: Tympanic membrane normal.  Nose: Nose normal. Right sinus exhibits no maxillary sinus tenderness and no frontal sinus tenderness. Left sinus exhibits no maxillary sinus tenderness and no frontal sinus tenderness.  Mouth/Throat: Uvula is midline, oropharynx is clear and moist and  mucous membranes are normal.  Eyes: Conjunctivae and EOM are normal. Pupils are equal, round, and reactive to light.  Fundoscopic exam:      The right eye shows no hemorrhage and no papilledema.       The left eye shows no hemorrhage and no papilledema.  Neck: Normal range of motion and full passive range of motion without pain. Neck supple. Carotid bruit is not present. No Brudzinski's sign and no Kernig's sign noted.  Cardiovascular: Normal rate, regular rhythm and normal heart sounds.   Pulmonary/Chest: Effort normal and breath sounds normal.  Abdominal: Soft. Bowel sounds are normal. She exhibits no distension.  Musculoskeletal: Normal range of motion.  Lymphadenopathy:    She has no cervical adenopathy.  Neurological: She is alert and oriented to person, place, and time. She has normal strength and normal reflexes. She displays no tremor. No cranial nerve deficit or sensory deficit. She displays a negative Romberg sign. Coordination and gait normal.       Finger->nose, heel-> shin WNL. Tandem gait steady.   Skin: Skin is warm and dry.  Psychiatric: She has a normal mood and affect. Her behavior is normal. Judgment and thought content normal.    ED Course  Procedures (including critical care time)  Labs Reviewed - No data to display No results found.   1. Hypertension   2. Vertigo   3. Headache     MDM  Previous records  reviewed. As noted in history of present illness. Concern for hypertensive urgency with persistently high blood pressure, intermittent headaches, and vertigo. Patient is neurologically intact here. Transferring to the ER.   Luiz Blare, MD 01/12/12 2022

## 2012-01-12 NOTE — ED Notes (Signed)
Called report to triage nurse, Alvino Chapel, RN. Pt. Going by shuttle.

## 2012-01-12 NOTE — ED Notes (Signed)
Patient transfer from Urgent Care; patient complaining of headache and hypertension for the past three days.  Patient reports history of hypertension.  Patient also reports dizziness.  Denies blurred vision and syncopal episode.  Patient was referred to the ED from Urgent Care for further evaluation.

## 2012-01-12 NOTE — Discharge Instructions (Signed)
Arterial Hypertension Take a new blood pressure medicine as prescribed. Call the family practice office in the morning to be seen tomorrow. Return to the ED for new or worsening symptoms Arterial hypertension (high blood pressure) is a condition of elevated pressure in your blood vessels. Hypertension over a long period of time is a risk factor for strokes, heart attacks, and heart failure. It is also the leading cause of kidney (renal) failure.  CAUSES   In Adults -- Over 90% of all hypertension has no known cause. This is called essential or primary hypertension. In the other 10% of people with hypertension, the increase in blood pressure is caused by another disorder. This is called secondary hypertension. Important causes of secondary hypertension are:   Heavy alcohol use.   Obstructive sleep apnea.   Hyperaldosterosim (Conn's syndrome).   Steroid use.   Chronic kidney failure.   Hyperparathyroidism.   Medications.   Renal artery stenosis.   Pheochromocytoma.   Cushing's disease.   Coarctation of the aorta.   Scleroderma renal crisis.   Licorice (in excessive amounts).   Drugs (cocaine, methamphetamine).  Your caregiver can explain any items above that apply to you.  In Children -- Secondary hypertension is more common and should always be considered.   Pregnancy -- Few women of childbearing age have high blood pressure. However, up to 10% of them develop hypertension of pregnancy. Generally, this will not harm the woman. It may be a sign of 3 complications of pregnancy: preeclampsia, HELLP syndrome, and eclampsia. Follow up and control with medication is necessary.  SYMPTOMS   This condition normally does not produce any noticeable symptoms. It is usually found during a routine exam.   Malignant hypertension is a late problem of high blood pressure. It may have the following symptoms:   Headaches.   Blurred vision.   End-organ damage (this means your kidneys,  heart, lungs, and other organs are being damaged).   Stressful situations can increase the blood pressure. If a person with normal blood pressure has their blood pressure go up while being seen by their caregiver, this is often termed "white coat hypertension." Its importance is not known. It may be related with eventually developing hypertension or complications of hypertension.   Hypertension is often confused with mental tension, stress, and anxiety.  DIAGNOSIS  The diagnosis is made by 3 separate blood pressure measurements. They are taken at least 1 week apart from each other. If there is organ damage from hypertension, the diagnosis may be made without repeat measurements. Hypertension is usually identified by having blood pressure readings:  Above 140/90 mmHg measured in both arms, at 3 separate times, over a couple weeks.   Over 130/80 mmHg should be considered a risk factor and may require treatment in patients with diabetes.  Blood pressure readings over 120/80 mmHg are called "pre-hypertension" even in non-diabetic patients. To get a true blood pressure measurement, use the following guidelines. Be aware of the factors that can alter blood pressure readings.  Take measurements at least 1 hour after caffeine.   Take measurements 30 minutes after smoking and without any stress. This is another reason to quit smoking - it raises your blood pressure.   Use a proper cuff size. Ask your caregiver if you are not sure about your cuff size.   Most home blood pressure cuffs are automatic. They will measure systolic and diastolic pressures. The systolic pressure is the pressure reading at the start of sounds. Diastolic pressure is the  pressure at which the sounds disappear. If you are elderly, measure pressures in multiple postures. Try sitting, lying or standing.   Sit at rest for a minimum of 5 minutes before taking measurements.   You should not be on any medications like decongestants.  These are found in many cold medications.   Record your blood pressure readings and review them with your caregiver.  If you have hypertension:  Your caregiver may do tests to be sure you do not have secondary hypertension (see "causes" above).   Your caregiver may also look for signs of metabolic syndrome. This is also called Syndrome X or Insulin Resistance Syndrome. You may have this syndrome if you have type 2 diabetes, abdominal obesity, and abnormal blood lipids in addition to hypertension.   Your caregiver will take your medical and family history and perform a physical exam.   Diagnostic tests may include blood tests (for glucose, cholesterol, potassium, and kidney function), a urinalysis, or an EKG. Other tests may also be necessary depending on your condition.  PREVENTION  There are important lifestyle issues that you can adopt to reduce your chance of developing hypertension:  Maintain a normal weight.   Limit the amount of salt (sodium) in your diet.   Exercise often.   Limit alcohol intake.   Get enough potassium in your diet. Discuss specific advice with your caregiver.   Follow a DASH diet (dietary approaches to stop hypertension). This diet is rich in fruits, vegetables, and low-fat dairy products, and avoids certain fats.  PROGNOSIS  Essential hypertension cannot be cured. Lifestyle changes and medical treatment can lower blood pressure and reduce complications. The prognosis of secondary hypertension depends on the underlying cause. Many people whose hypertension is controlled with medicine or lifestyle changes can live a normal, healthy life.  RISKS AND COMPLICATIONS  While high blood pressure alone is not an illness, it often requires treatment due to its short- and long-term effects on many organs. Hypertension increases your risk for:  CVAs or strokes (cerebrovascular accident).   Heart failure due to chronically high blood pressure (hypertensive  cardiomyopathy).   Heart attack (myocardial infarction).   Damage to the retina (hypertensive retinopathy).   Kidney failure (hypertensive nephropathy).  Your caregiver can explain list items above that apply to you. Treatment of hypertension can significantly reduce the risk of complications. TREATMENT   For overweight patients, weight loss and regular exercise are recommended. Physical fitness lowers blood pressure.   Mild hypertension is usually treated with diet and exercise. A diet rich in fruits and vegetables, fat-free dairy products, and foods low in fat and salt (sodium) can help lower blood pressure. Decreasing salt intake decreases blood pressure in a 1/3 of people.   Stop smoking if you are a smoker.  The steps above are highly effective in reducing blood pressure. While these actions are easy to suggest, they are difficult to achieve. Most patients with moderate or severe hypertension end up requiring medications to bring their blood pressure down to a normal level. There are several classes of medications for treatment. Blood pressure pills (antihypertensives) will lower blood pressure by their different actions. Lowering the blood pressure by 10 mmHg may decrease the risk of complications by as much as 25%. The goal of treatment is effective blood pressure control. This will reduce your risk for complications. Your caregiver will help you determine the best treatment for you according to your lifestyle. What is excellent treatment for one person, may not be for you.  HOME CARE INSTRUCTIONS   Do not smoke.   Follow the lifestyle changes outlined in the "Prevention" section.   If you are on medications, follow the directions carefully. Blood pressure medications must be taken as prescribed. Skipping doses reduces their benefit. It also puts you at risk for problems.   Follow up with your caregiver, as directed.   If you are asked to monitor your blood pressure at home, follow  the guidelines in the "Diagnosis" section above.  SEEK MEDICAL CARE IF:   You think you are having medication side effects.   You have recurrent headaches or lightheadedness.   You have swelling in your ankles.   You have trouble with your vision.  SEEK IMMEDIATE MEDICAL CARE IF:   You have sudden onset of chest pain or pressure, difficulty breathing, or other symptoms of a heart attack.   You have a severe headache.   You have symptoms of a stroke (such as sudden weakness, difficulty speaking, difficulty walking).  MAKE SURE YOU:   Understand these instructions.   Will watch your condition.   Will get help right away if you are not doing well or get worse.  Document Released: 09/07/2005 Document Revised: 08/27/2011 Document Reviewed: 04/07/2007 [facility] Patient Information 2012 [place], [place].

## 2012-01-12 NOTE — ED Provider Notes (Signed)
History     CSN: [id number]  Arrival date & time 01/12/12  2024   First MD Initiated Contact with Patient 01/12/12 2123      Chief Complaint  Patient presents with  . Hypertension  . Headache    (Consider location/radiation/quality/duration/timing/severity/associated sxs/prior treatment) HPI Comments: Patient transfer from urgent care with 3 days of gradual onset intermittent headaches that are right-sided and associated with episodes of dizziness and lightheadedness with occasional sensations of vertigo. She denies any chest pain or shortness of breath. She does compliance with her Maxzide blood pressure medication. She feels a lot labetalol and she was pregnant but has not been for over a year. She denies any visual change, abdominal pain, nausea or vomiting. She denies any weakness, numbness or tingling. Her blood pressure home today was 176/128. Her blood pressure readings over the past 3 days and then 1 7190 range. Previously had been in the 1:30 to 150 range.  The history is provided by the patient.    Past Medical History  Diagnosis Date  . Anemia   . Hypertension     History reviewed. No pertinent past surgical history.  History reviewed. No pertinent family history.  History  Substance Use Topics  . Smoking status: Never Smoker   . Smokeless tobacco: Never Used  . Alcohol Use: No    OB History    Grav Para Term Preterm Abortions TAB SAB Ect Mult Living   6 6 6  0 0 0 0 0 0 6      Review of Systems  Constitutional: Negative for fever, activity change and appetite change.  HENT: Negative for congestion and rhinorrhea.   Eyes: Negative for photophobia and visual disturbance.  Respiratory: Negative for cough, chest tightness and shortness of breath.   Cardiovascular: Negative for chest pain.  Gastrointestinal: Negative for nausea, vomiting and abdominal pain.  Genitourinary: Negative for dysuria and hematuria.  Musculoskeletal: Negative for back pain.    Neurological: Positive for dizziness, light-headedness and headaches.    Allergies  Review of patient's allergies indicates no known allergies.  Home Medications   Current Outpatient Rx  Name Route Sig Dispense Refill  . TRIAMTERENE-HCTZ 37.5-25 MG PO TABS Oral Take 1 tablet by mouth daily.      BP 163/125  Pulse 75  Temp(Src) 98.5 F (36.9 C) (Oral)  Resp 17  SpO2 95%  LMP 01/10/2012  Physical Exam  Constitutional: She is oriented to person, place, and time. She appears well-developed and well-nourished. No distress.  HENT:  Head: Normocephalic and atraumatic.  Mouth/Throat: Oropharynx is clear and moist. No oropharyngeal exudate.  Eyes: Conjunctivae are normal. Pupils are equal, round, and reactive to light.  Neck: Normal range of motion. Neck supple.  Cardiovascular: Normal rate, regular rhythm and normal heart sounds.   No murmur heard. Pulmonary/Chest: Effort normal and breath sounds normal. No respiratory distress.  Abdominal: Soft. There is no tenderness. There is no rebound and no guarding.  Musculoskeletal: Normal range of motion. She exhibits no edema and no tenderness.  Neurological: She is alert and oriented to person, place, and time. No cranial nerve deficit.       5 out of 5 strength throughout, no ataxia finger to nose, cranial nerves II through XII intact, no nystagmus  Skin: Skin is warm.    ED Course  Procedures (including critical care time)  Labs Reviewed  CBC - Abnormal; Notable for the following:    Hemoglobin 11.9 (*)    HCT 34.8 (*)  All other components within normal limits  BASIC METABOLIC PANEL - Abnormal; Notable for the following:    Potassium 3.2 (*)    Creatinine, Ser 1.29 (*)    GFR calc non Af Amer 53 (*)    GFR calc Af Amer 62 (*)    All other components within normal limits  URINALYSIS, ROUTINE W REFLEX MICROSCOPIC - Abnormal; Notable for the following:    Hgb urine dipstick LARGE (*)    Protein, ur 30 (*)    Leukocytes,  UA TRACE (*)    All other components within normal limits  POCT I-STAT, CHEM 8 - Abnormal; Notable for the following:    Potassium 3.2 (*)    Creatinine, Ser 1.40 (*)    Glucose, Bld 102 (*)    All other components within normal limits  URINE MICROSCOPIC-ADD ON - Abnormal; Notable for the following:    Squamous Epithelial / LPF FEW (*)    Bacteria, UA FEW (*)    All other components within normal limits  POCT I-STAT TROPONIN I  POCT PREGNANCY, URINE   Ct Head Wo Contrast  01/12/2012  *RADIOLOGY REPORT*  Clinical Data: Severe headaches, elevated blood pressure, and pain for 3-4 days.  CT HEAD WITHOUT CONTRAST  Technique:  Contiguous axial images were obtained from the base of the skull through the vertex without contrast.  Comparison: 10/23/2009  Findings: The ventricles and sulci appear symmetrical.  No mass effect or midline shift.  No abnormal extra-axial fluid collections.  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  Gray-white matter junctions are distinct.  Basal cisterns are not effaced.  No evidence of acute intracranial hemorrhage.  No depressed skull fractures.  Visualized mastoid air cells are not opacified. Old deformity of the right medial orbital wall consistent with old fracture.  Opacification of right ethmoid air cell which is stable since the previous study.  No significant changes since the previous study.  IMPRESSION: No acute intracranial abnormalities.  Original Report Authenticated By: Marlon Pel, M.D.     No diagnosis found.    MDM  Hypertension with headache, intermittent lightheadedness. No vision changes no chest pain or shortness of breath.  Slight elevation of creatinine 1.3 from 0.8. EKG had ischemic, troponin negative.  Elevated blood pressure discussed with family practice team. They concurred with starting low-dose beta blocker and will see patient in the office tomorrow morning.  Patient's headache has improved. No CP or SOB.  Patient in agreement to call [facility] in the morning to be seen. Blood pressure 143/103 at discharge.   Date: 01/12/2012  Rate: 74  Rhythm: normal sinus rhythm  QRS Axis: normal  Intervals: normal  ST/T Wave abnormalities: nonspecific ST/T changes  Conduction Disutrbances:none  Narrative Interpretation: T wave inversions V3 and V4  Old EKG Reviewed: none available    Glynn Octave, MD 01/13/12 0006

## 2012-01-12 NOTE — ED Notes (Signed)
Patient states that she takes blood pressure medication at home, and that she took her daily dose already today.

## 2012-01-12 NOTE — ED Notes (Signed)
Lab at bedside, Tech at bedside for EKG

## 2012-01-12 NOTE — Telephone Encounter (Signed)
Patient calls now stating yesterday BP reading was 176/128. Then 2.5 hours later reading was 128/98. For 2-3 days head has been spinning , she feels dizzy.  Advised her to go to Urgent Care now for evaluation but she doesn't want to and wants appointment here tomorrow.  Had her check BP while we were on the phone and reading is 170/119.. Strongly encouraged her to go to Urgent Care now and schedule appointment with PCP to follow up. She is agreeable and states she will go now.

## 2012-01-12 NOTE — ED Notes (Signed)
Patient transported to CT 

## 2012-01-13 ENCOUNTER — Ambulatory Visit: Payer: Medicaid Other

## 2012-01-15 ENCOUNTER — Encounter: Payer: Self-pay | Admitting: Family Medicine

## 2012-01-15 ENCOUNTER — Ambulatory Visit (INDEPENDENT_AMBULATORY_CARE_PROVIDER_SITE_OTHER): Payer: Self-pay | Admitting: Family Medicine

## 2012-01-15 VITALS — BP 170/100 | HR 56 | Temp 98.1°F | Ht 62.0 in | Wt 200.2 lb

## 2012-01-15 DIAGNOSIS — I10 Essential (primary) hypertension: Secondary | ICD-10-CM

## 2012-01-15 MED ORDER — TRIAMTERENE-HCTZ 37.5-25 MG PO TABS: 1.0000 | ORAL_TABLET | Freq: Every day | ORAL | Status: DC

## 2012-01-15 MED ORDER — METOPROLOL TARTRATE 25 MG PO TABS: 25.0000 mg | ORAL_TABLET | Freq: Two times a day (BID) | ORAL | Status: DC

## 2012-01-15 NOTE — Assessment & Plan Note (Signed)
Poorly controlled.  Will add back HCTZ-triamterene- advised patient to keep BP log and return to see PCP in 1-2 weeks

## 2012-01-15 NOTE — Progress Notes (Signed)
  Subjective:    Patient ID: Tamara Church, female    DOB: August 13, 1976, 36 y.o.   MRN: [id number]  HPI  Here to follow-up ER visit for headache/hypertension  Was noted to be hypertensive at that visit.  CT head- normal  Patient states was taking HCTZ-traimeterene at that time.  Was started on lopressor in the ER, patient stopped HCT-traimeterene  HYPERTENSION  BP Readings from Last 3 Encounters:  01/13/12 133/92  01/12/12 194/130  12/30/11 205/123    Hypertension ROS: taking medications as instructed, no medication side effects noted, home BP monitoring in range of 170-200's systolic over 90's diastolic, no chest pain on exertion, no dyspnea on exertion, no swelling of ankles and no intermittent claudication symptoms.   Notes headache mild, improved at this time.  I have reviewed patient's  PMH, FH, and Social history and Medications as related to this visit. History of high blood pressure for past 3-4 years, gave birth 9 months ago.  Not breastfeeding  Review of Systems     Objective:   Physical Exam        Assessment & Plan:

## 2012-01-15 NOTE — Patient Instructions (Signed)
Take both blood pressure medicines daily  Check your blood pressure every day and write it down  Come back to see Dr. Tye Savoy in 1-2 weeks

## 2012-01-27 ENCOUNTER — Other Ambulatory Visit: Payer: Self-pay | Admitting: Family Medicine

## 2012-01-28 ENCOUNTER — Encounter: Payer: Self-pay | Admitting: Family Medicine

## 2012-01-28 ENCOUNTER — Ambulatory Visit (INDEPENDENT_AMBULATORY_CARE_PROVIDER_SITE_OTHER): Payer: Self-pay | Admitting: Family Medicine

## 2012-01-28 VITALS — BP 150/98 | HR 90 | Temp 98.2°F | Ht 62.0 in | Wt 198.7 lb

## 2012-01-28 DIAGNOSIS — R799 Abnormal finding of blood chemistry, unspecified: Secondary | ICD-10-CM

## 2012-01-28 DIAGNOSIS — R7989 Other specified abnormal findings of blood chemistry: Secondary | ICD-10-CM

## 2012-01-28 DIAGNOSIS — I10 Essential (primary) hypertension: Secondary | ICD-10-CM

## 2012-01-28 DIAGNOSIS — Z3009 Encounter for other general counseling and advice on contraception: Secondary | ICD-10-CM

## 2012-01-28 MED ORDER — METOPROLOL TARTRATE 25 MG PO TABS: 25.0000 mg | ORAL_TABLET | Freq: Two times a day (BID) | ORAL | Status: DC

## 2012-01-28 NOTE — Patient Instructions (Signed)
Reasonable to keep IUD for a little longer. Not sure why you are having back pains, would try tylenol or heating pads. Restart your blood pressure medicine. Make an appointment with Dr. Tye Savoy within the next month to discuss options. You would not be a good candidate for pills, but depo would be okay. If you have pain with urination, fevers, discharge or pelvic pain then return to care.

## 2012-01-31 DIAGNOSIS — R7989 Other specified abnormal findings of blood chemistry: Secondary | ICD-10-CM | POA: Insufficient documentation

## 2012-01-31 DIAGNOSIS — Z3009 Encounter for other general counseling and advice on contraception: Secondary | ICD-10-CM | POA: Insufficient documentation

## 2012-01-31 NOTE — Progress Notes (Signed)
  Subjective:    Patient ID: Tamara Church, female    DOB: 09-14-1976, 36 y.o.   MRN: [id number]  HPI  1. HTN. Patient ran out of her metoprolol about 2 weeks ago. She is still on maxzide. Her BP has been elevated and she requested a refill from pharmacy but they said she needed new rx. She denies any current HA, chest pains, dyspnea, edema.   2. Contraception. Had mirena placed at her 6 week postpartum visit in September. She thinks that having some lower back pains for past several weeks may be related to the IUD and she is annoyed about irregular menstrual bleeding. She had a mirena for 7 years prior to her recent pregnancy which worked well. She desires long term contraception. Nonsmoker. Denies discharge, pelvic pain, cramping, dysuria, fevers, abdominal pains.  Review of Systems See HPI otherwise negative.  reports that she has never smoked. She has never used smokeless tobacco.      Objective:   Physical Exam  Vitals reviewed. Constitutional: She is oriented to person, place, and time. She appears well-developed and well-nourished. No distress.  HENT:  Head: Normocephalic and atraumatic.  Mouth/Throat: Oropharynx is clear and moist.  Eyes: EOM are normal.  Cardiovascular: Normal rate, regular rhythm, normal heart sounds and intact distal pulses.   No murmur heard. Pulmonary/Chest: Effort normal and breath sounds normal.  Abdominal: Soft. Bowel sounds are normal. She exhibits no distension. There is no tenderness.  Genitourinary: Vagina normal and uterus normal. No vaginal discharge found.       iud strings observed at os. No cervical motion tenderness, adnexa and uterus without abnormality.   Musculoskeletal: She exhibits no edema and no tenderness.       No spine TTP. Has some diffuse muscular tightness in lumbar area, moderate lumbar lordosis. No LE weakness or numbness. Negative SL. ROM intact.   Neurological: She is alert and oriented to person, place, and time. Coordination  normal.  Skin: No rash noted.  Psychiatric: She has a normal mood and affect.       Assessment & Plan:

## 2012-01-31 NOTE — Assessment & Plan Note (Signed)
Uncontrolled due to noncompliance-out of metoprolol. Have sent new rx, advised patient to return for BP check in 1-2 weeks after restarting medication. Noted she presented to ED with values >200 systolic last month and elevated cr. She will need to have lab rechecked in 1 week as well.

## 2012-01-31 NOTE — Assessment & Plan Note (Addendum)
Patient to f/u for lab draw once BP improved. If this cr elevation is persistent, will need additional workup as this would be new renal disease. Last measurement 9 months ago was 0.54.   Lab Results  Component Value Date   CREATININE 1.40* 01/12/2012

## 2012-01-31 NOTE — Assessment & Plan Note (Signed)
Advised patient that MIRENA is likely her best option for effective long term contraception in setting of uncontrolled hypertension and age near 56. She decided to try the IUD for few more weeks-month (she requested removal today). I reassured her i do not see any connection between low back pain with her IUD (pelvic exam normal). Advised to avoid NSAIDs but try heat and tylenol for back pain and reassess her satisfaction with mirena. Discussed other options would be depo (dec BMD) and minipill (less efficacy), implanon (typically worse bleeding irregularity).

## 2012-02-01 ENCOUNTER — Telehealth: Payer: Self-pay | Admitting: *Deleted

## 2012-02-01 NOTE — Telephone Encounter (Signed)
Message copied by Farrell Ours on Mon Feb 01, 2012 12:00 PM ------      Message from: Durwin Reges      Created: Sun Jan 31, 2012  7:07 AM      Regarding: needs BP follow up and labs       Please call patient : she needs a BP follow up visit and lab draw since her last labs in the ED showed elevated creatinine due to high blood pressure.

## 2012-03-10 ENCOUNTER — Telehealth: Payer: Self-pay | Admitting: *Deleted

## 2012-03-10 NOTE — Telephone Encounter (Signed)
Attempted to call patient phone rang & rang. The mobile number is not in service

## 2012-03-10 NOTE — Telephone Encounter (Signed)
Message copied by Farrell Ours on Thu Mar 10, 2012  4:50 PM ------      Message from: Durwin Reges      Created: Wed Mar 09, 2012  3:25 PM      Regarding: BP check , lab draw       Can you notify patient she needs bp check and blood draw. Unclear if this was done in May already.

## 2012-03-15 NOTE — Telephone Encounter (Signed)
Spoke with patient, she states she will call back to schedule an appointment for bp check and lab draw.

## 2012-03-18 ENCOUNTER — Other Ambulatory Visit: Payer: Self-pay

## 2012-03-18 ENCOUNTER — Ambulatory Visit (INDEPENDENT_AMBULATORY_CARE_PROVIDER_SITE_OTHER): Payer: Self-pay | Admitting: *Deleted

## 2012-03-18 VITALS — BP 124/88 | HR 70

## 2012-03-18 DIAGNOSIS — I10 Essential (primary) hypertension: Secondary | ICD-10-CM

## 2012-03-18 NOTE — Progress Notes (Signed)
CMP DONE TODAY Tamara Church 

## 2012-03-18 NOTE — Progress Notes (Signed)
Patient had labs drawn and here for BP check---124/88 & P--70.  Gaylene Brooks, RN

## 2012-03-19 LAB — COMPREHENSIVE METABOLIC PANEL
AST: 18 U/L (ref 0–37)
Albumin: 4.4 g/dL (ref 3.5–5.2)
Alkaline Phosphatase: 38 U/L — ABNORMAL LOW (ref 39–117)
BUN: 18 mg/dL (ref 6–23)
Potassium: 3.8 mEq/L (ref 3.5–5.3)
Sodium: 140 mEq/L (ref 135–145)

## 2012-03-21 ENCOUNTER — Telehealth: Payer: Self-pay | Admitting: Family Medicine

## 2012-03-21 DIAGNOSIS — N182 Chronic kidney disease, stage 2 (mild): Secondary | ICD-10-CM | POA: Insufficient documentation

## 2012-03-21 NOTE — Telephone Encounter (Signed)
Spoke with patient regarding creatinine remaining mildly elevated. This seems to have started after her ED visit for uncontrolled hypertension this year and is stable still. Advised her regarding importance of BP control in the long term, specifically <140/90 to avoid decline of renal function. Patient voices understanding. Advised her to have blood work checked regularly, q 6-12 months, and to make regular visit for BP check.  Estimated Creatinine Clearance: 64.6 ml/min (by C-G formula based on Cr of 1.28).

## 2012-03-22 ENCOUNTER — Ambulatory Visit: Payer: Self-pay | Admitting: Family Medicine

## 2012-03-23 ENCOUNTER — Ambulatory Visit (INDEPENDENT_AMBULATORY_CARE_PROVIDER_SITE_OTHER): Payer: Self-pay | Admitting: Family Medicine

## 2012-03-23 ENCOUNTER — Encounter: Payer: Self-pay | Admitting: Family Medicine

## 2012-03-23 VITALS — BP 107/79 | HR 90 | Ht 62.75 in | Wt 194.0 lb

## 2012-03-23 DIAGNOSIS — B86 Scabies: Secondary | ICD-10-CM

## 2012-03-23 MED ORDER — PERMETHRIN 5 % EX CREA: TOPICAL_CREAM | Freq: Once | CUTANEOUS | Status: AC

## 2012-03-23 MED ORDER — DIPHENHYDRAMINE HCL 50 MG PO TABS: 50.0000 mg | ORAL_TABLET | Freq: Every evening | ORAL | Status: DC | PRN

## 2012-03-23 NOTE — Assessment & Plan Note (Signed)
A: scabies vs. Dyshidrotic eczema. Scabies most likely given distribution and son with new onset rash.  P: -permetherin cream per AVS -benadryl q HS prn ithcing. -f/u in 4 weeks/prn per AVS reviewed s/s of secondary bacterial infection.

## 2012-03-23 NOTE — Progress Notes (Signed)
Subjective:     Patient ID: Tamara Church, female   DOB: 06-06-1976, 36 y.o.   MRN: [id number]  HPI 36 yo F presents with a complaint of pruritic rash x 6 days. Rash started on bilateral wrist and spread to arms and abdomen. Patients 104 mos old son developing similar rash in axilla and feet. Patient denies known contacts to rash. She admits to visiting family in [place] 1 week ago for a few hours, otherwise she denies travel. She denies fever. She has tried hydrocortisone cream without significant relief of symptoms.   Review of Systems As per HPI    Objective:   Physical Exam BP 107/79  Pulse 90  Ht 5' 2.75" (1.594 m)  Wt 194 lb (87.998 kg)  BMI 34.64 kg/m2 General appearance: alert, cooperative and no distress Skin: excoriation - arm(s) bilateral, wrist(s) evidenceo f multiple papules with linear spread between fingers and on wrist with central scab. Small papules on abdomen.     Assessment and Plan:

## 2012-03-23 NOTE — Patient Instructions (Addendum)
Tamara Church,   Thank you for coming in today. Please treat yourself and your son with the permetherin. Apply from neck down, leave on for 8-14 hours and wash off. Repeat application in 1-2 weeks.   Dr. Armen Pickup   Scabies Scabies are small bugs (mites) that burrow under the skin and cause red bumps and severe itching. These bugs can only be seen with a microscope. Scabies are highly contagious. They can spread easily from person to person by direct contact. They are also spread through sharing clothing or linens that have the scabies mites living in them. It is not unusual for an entire family to become infected through shared towels, clothing, or bedding.  HOME CARE INSTRUCTIONS   Your caregiver may prescribe a cream or lotion to kill the mites. If this cream is prescribed; massage the cream into the entire area of the body from the neck to the bottom of both feet. Also massage the cream into the scalp and face if your child is less than 40 year old. Avoid the eyes and mouth.   Leave the cream on for 8 to12 hours. Do not wash your hands after application. Your child should bathe or shower after the 8 to 12 hour application period. Sometimes it is helpful to apply the cream to your child at right before bedtime.   One treatment is usually effective and will eliminate approximately 95% of infestations. For severe cases, your caregiver may decide to repeat the treatment in 1 week. Everyone in your household should be treated with one application of the cream.   New rashes or burrows should not appear after successful treatment within 24 to 48 hours; however the itching and rash may last for 2 to 4 weeks after successful treatment. If your symptoms persist longer than this, see your caregiver.   Your caregiver also may prescribe a medication to help with the itching or to help the rash go away more quickly.   Scabies can live on clothing or linens for up to 3 days. Your entire child's recently used  clothing, towels, stuffed toys, and bed linens should be washed in hot water and then dried in a dryer for at least 20 minutes on high heat. Items that cannot be washed should be enclosed in a plastic bag for at least 3 days.   To help relieve itching, bathe your child in a cool bath or apply cool washcloths to the affected areas.   Your child may return to school after treatment with the prescribed cream.  SEEK MEDICAL CARE IF:   The itching persists longer than 4 weeks after treatment.   The rash spreads or becomes infected (the area has red blisters or yellow-tan crust).  Document Released: 09/07/2005 Document Revised: 08/27/2011 Document Reviewed: 01/16/2009 [facility] Patient Information 2012 [place], [place].

## 2012-07-15 ENCOUNTER — Emergency Department (HOSPITAL_COMMUNITY)
Admission: EM | Admit: 2012-07-15 | Discharge: 2012-07-15 | Discharge status: Absent without leave | Payer: Self-pay | Source: Home / Self Care

## 2012-07-20 ENCOUNTER — Encounter: Payer: Self-pay | Admitting: Family Medicine

## 2012-10-18 ENCOUNTER — Other Ambulatory Visit: Payer: Self-pay | Admitting: *Deleted

## 2012-10-18 MED ORDER — TRIAMTERENE-HCTZ 37.5-25 MG PO TABS: 1.0000 | ORAL_TABLET | Freq: Every day | ORAL | Status: DC

## 2012-10-23 ENCOUNTER — Encounter (HOSPITAL_COMMUNITY): Payer: Self-pay | Admitting: *Deleted

## 2012-10-23 ENCOUNTER — Inpatient Hospital Stay (HOSPITAL_COMMUNITY)
Admission: AD | Admit: 2012-10-23 | Discharge: 2012-10-23 | Disposition: A | Discharge status: Home / Self Care | Payer: Self-pay | Source: Ambulatory Visit | Attending: Obstetrics & Gynecology | Admitting: Obstetrics & Gynecology

## 2012-10-23 DIAGNOSIS — R300 Dysuria: Secondary | ICD-10-CM | POA: Insufficient documentation

## 2012-10-23 DIAGNOSIS — R102 Pelvic and perineal pain: Secondary | ICD-10-CM

## 2012-10-23 DIAGNOSIS — N949 Unspecified condition associated with female genital organs and menstrual cycle: Secondary | ICD-10-CM | POA: Insufficient documentation

## 2012-10-23 LAB — WET PREP, GENITAL
Clue Cells Wet Prep HPF POC: NONE SEEN
Trich, Wet Prep: NONE SEEN

## 2012-10-23 LAB — URINALYSIS, ROUTINE W REFLEX MICROSCOPIC
Glucose, UA: NEGATIVE mg/dL
Ketones, ur: NEGATIVE mg/dL
Protein, ur: NEGATIVE mg/dL
Urobilinogen, UA: 0.2 mg/dL (ref 0.0–1.0)

## 2012-10-23 LAB — URINE MICROSCOPIC-ADD ON

## 2012-10-23 MED ORDER — ACYCLOVIR 400 MG PO TABS: 400.0000 mg | ORAL_TABLET | Freq: Three times a day (TID) | ORAL | Status: DC

## 2012-10-23 MED ORDER — VALACYCLOVIR HCL 500 MG PO TABS
1000.0000 mg | ORAL_TABLET | Freq: Once | ORAL | Status: AC
  Administered 2012-10-23: 1000 mg via ORAL
  Filled 2012-10-23: qty 2

## 2012-10-23 NOTE — MAU Provider Note (Signed)
History     CSN: [id number]  Arrival date and time: 10/23/12 4098   First Provider Initiated Contact with Patient 10/23/12 0303      Chief Complaint  Patient presents with  . Dysuria   HPI  Tamara Church is a 37 y.o. [id number] who presents today with vaginal burning and burning with urination. She states that her symptoms began yesterday, and today when she woke up around 2:00am she noticed a little bit of spotting when she wiped  Past Medical History  Diagnosis Date  . Anemia   . Hypertension     History reviewed. No pertinent past surgical history.  History reviewed. No pertinent family history.  History  Substance Use Topics  . Smoking status: Never Smoker   . Smokeless tobacco: Never Used  . Alcohol Use: No    Allergies: No Known Allergies  Prescriptions prior to admission  Medication Sig Dispense Refill  . levonorgestrel (MIRENA) 20 MCG/24HR IUD 1 each by Intrauterine route once.      . metoprolol tartrate (LOPRESSOR) 25 MG tablet Take 1 tablet (25 mg total) by mouth 2 (two) times daily.  60 tablet  11  . triamterene-hydrochlorothiazide (MAXZIDE-25) 37.5-25 MG per tablet Take 1 each (1 tablet total) by mouth daily.  30 tablet  6  . diphenhydrAMINE (BENADRYL) 50 MG tablet Take 1 tablet (50 mg total) by mouth at bedtime as needed for itching.  30 tablet  0    Review of Systems  Constitutional: Negative for fever and chills.  Gastrointestinal: Negative for nausea, vomiting, abdominal pain, diarrhea and constipation.  Genitourinary: Positive for dysuria. Negative for urgency and frequency.  Musculoskeletal: Negative for myalgias.  Neurological: Negative for dizziness.   Physical Exam   Blood pressure 161/92, pulse 82, temperature 99.2 F (37.3 C), temperature source Oral, resp. rate 18, height 5\' 2"  (1.575 m), weight 184 lb (83.462 kg).  Physical Exam  Nursing note and vitals reviewed. Constitutional: She is oriented to person, place, and time. She appears  well-developed and well-nourished. No distress.  Cardiovascular: Normal rate.   Respiratory: Effort normal.  Genitourinary:        External: multiple small lesions around the urethra and clitoris Vagina: small lesions on the vaginal walls. Copious green discharge Cervix: pink, no CMT Uterus: AV, NSSC Adnexa: NT  Neurological: She is alert and oriented to person, place, and time.  Skin: Skin is warm and dry.    MAU Course  Procedures  Results for orders placed during the hospital encounter of 10/23/12 (from the past 24 hour(s))  URINALYSIS, ROUTINE W REFLEX MICROSCOPIC     Status: Abnormal   Collection Time   10/23/12  2:35 AM      Component Value Range   Color, Urine YELLOW  YELLOW   APPearance CLEAR  CLEAR   Specific Gravity, Urine 1.025  1.005 - 1.030   pH 6.0  5.0 - 8.0   Glucose, UA NEGATIVE  NEGATIVE mg/dL   Hgb urine dipstick SMALL (*) NEGATIVE   Bilirubin Urine NEGATIVE  NEGATIVE   Ketones, ur NEGATIVE  NEGATIVE mg/dL   Protein, ur NEGATIVE  NEGATIVE mg/dL   Urobilinogen, UA 0.2  0.0 - 1.0 mg/dL   Nitrite NEGATIVE  NEGATIVE   Leukocytes, UA MODERATE (*) NEGATIVE  URINE MICROSCOPIC-ADD ON     Status: Normal   Collection Time   10/23/12  2:35 AM      Component Value Range   Squamous Epithelial / LPF RARE  RARE  WBC, UA 3-6  <3 WBC/hpf   RBC / HPF 0-2  <3 RBC/hpf  POCT PREGNANCY, URINE     Status: Normal   Collection Time   10/23/12  2:45 AM      Component Value Range   Preg Test, Ur NEGATIVE  NEGATIVE  WET PREP, GENITAL     Status: Abnormal   Collection Time   10/23/12  3:04 AM      Component Value Range   Yeast Wet Prep HPF POC NONE SEEN  NONE SEEN   Trich, Wet Prep NONE SEEN  NONE SEEN   Clue Cells Wet Prep HPF POC NONE SEEN  NONE SEEN   WBC, Wet Prep HPF POC MODERATE (*) NONE SEEN     Assessment and Plan   1. Vaginal pain    HSV culture pending Will treat based on symptoms RX Acyclovir 400mg  PO TID x 10 DAYS FU with [facility] as  needed  Tawnya Crook 10/23/2012, 3:11 AM

## 2012-10-23 NOTE — MAU Note (Signed)
Patient states she has been having some burning after urination that started yesterday morning. Thought it might be a urinary tract infection but noticed some spotting around 215 this morning.

## 2012-10-26 LAB — HERPES SIMPLEX VIRUS CULTURE

## 2013-09-13 ENCOUNTER — Emergency Department (HOSPITAL_COMMUNITY): Payer: Self-pay

## 2013-09-13 ENCOUNTER — Encounter (HOSPITAL_COMMUNITY): Payer: Self-pay | Admitting: Emergency Medicine

## 2013-09-13 ENCOUNTER — Emergency Department (HOSPITAL_COMMUNITY)
Admission: EM | Admit: 2013-09-13 | Discharge: 2013-09-13 | Disposition: A | Discharge status: Home / Self Care | Payer: Self-pay | Attending: Emergency Medicine | Admitting: Emergency Medicine

## 2013-09-13 DIAGNOSIS — R42 Dizziness and giddiness: Secondary | ICD-10-CM | POA: Insufficient documentation

## 2013-09-13 DIAGNOSIS — R51 Headache: Secondary | ICD-10-CM | POA: Insufficient documentation

## 2013-09-13 DIAGNOSIS — I160 Hypertensive urgency: Secondary | ICD-10-CM

## 2013-09-13 DIAGNOSIS — Z862 Personal history of diseases of the blood and blood-forming organs and certain disorders involving the immune mechanism: Secondary | ICD-10-CM | POA: Insufficient documentation

## 2013-09-13 DIAGNOSIS — E119 Type 2 diabetes mellitus without complications: Secondary | ICD-10-CM | POA: Insufficient documentation

## 2013-09-13 DIAGNOSIS — Z79899 Other long term (current) drug therapy: Secondary | ICD-10-CM | POA: Insufficient documentation

## 2013-09-13 DIAGNOSIS — I10 Essential (primary) hypertension: Secondary | ICD-10-CM | POA: Insufficient documentation

## 2013-09-13 DIAGNOSIS — R110 Nausea: Secondary | ICD-10-CM | POA: Insufficient documentation

## 2013-09-13 LAB — BASIC METABOLIC PANEL
BUN: 10 mg/dL (ref 6–23)
CO2: 26 mEq/L (ref 19–32)
Calcium: 9.7 mg/dL (ref 8.4–10.5)
Chloride: 98 mEq/L (ref 96–112)
Creatinine, Ser: 0.72 mg/dL (ref 0.50–1.10)
Glucose, Bld: 107 mg/dL — ABNORMAL HIGH (ref 70–99)

## 2013-09-13 LAB — CBC WITH DIFFERENTIAL/PLATELET
Eosinophils Relative: 2 % (ref 0–5)
HCT: 37.4 % (ref 36.0–46.0)
Hemoglobin: 12.5 g/dL (ref 12.0–15.0)
Lymphocytes Relative: 17 % (ref 12–46)
Lymphs Abs: 0.9 10*3/uL (ref 0.7–4.0)
MCV: 87 fL (ref 78.0–100.0)
Monocytes Absolute: 0.5 10*3/uL (ref 0.1–1.0)
Monocytes Relative: 9 % (ref 3–12)
Neutro Abs: 3.9 10*3/uL (ref 1.7–7.7)
RBC: 4.3 MIL/uL (ref 3.87–5.11)
RDW: 13.4 % (ref 11.5–15.5)
WBC: 5.4 10*3/uL (ref 4.0–10.5)

## 2013-09-13 MED ORDER — METOPROLOL TARTRATE 25 MG PO TABS
25.0000 mg | ORAL_TABLET | Freq: Once | ORAL | Status: AC
  Administered 2013-09-13: 25 mg via ORAL
  Filled 2013-09-13: qty 1

## 2013-09-13 MED ORDER — KETOROLAC TROMETHAMINE 30 MG/ML IJ SOLN
30.0000 mg | Freq: Once | INTRAMUSCULAR | Status: AC
  Administered 2013-09-13: 30 mg via INTRAVENOUS
  Filled 2013-09-13: qty 1

## 2013-09-13 MED ORDER — IBUPROFEN 600 MG PO TABS: 600.0000 mg | ORAL_TABLET | Freq: Four times a day (QID) | ORAL | Status: DC | PRN

## 2013-09-13 MED ORDER — MORPHINE SULFATE 4 MG/ML IJ SOLN
4.0000 mg | Freq: Once | INTRAMUSCULAR | Status: AC
  Administered 2013-09-13: 4 mg via INTRAVENOUS
  Filled 2013-09-13: qty 1

## 2013-09-13 MED ORDER — SODIUM CHLORIDE 0.9 % IV BOLUS (SEPSIS)
1000.0000 mL | Freq: Once | INTRAVENOUS | Status: AC
  Administered 2013-09-13: 1000 mL via INTRAVENOUS

## 2013-09-13 MED ORDER — ASPIRIN EC 81 MG PO TBEC: 81.0000 mg | DELAYED_RELEASE_TABLET | Freq: Every day | ORAL | Status: DC

## 2013-09-13 MED ORDER — DEXAMETHASONE SODIUM PHOSPHATE 10 MG/ML IJ SOLN
10.0000 mg | Freq: Once | INTRAMUSCULAR | Status: AC
  Administered 2013-09-13: 10 mg via INTRAVENOUS
  Filled 2013-09-13: qty 1

## 2013-09-13 MED ORDER — PROMETHAZINE HCL 25 MG PO TABS: 25.0000 mg | ORAL_TABLET | Freq: Four times a day (QID) | ORAL | Status: DC | PRN

## 2013-09-13 MED ORDER — ONDANSETRON HCL 4 MG/2ML IJ SOLN
4.0000 mg | Freq: Once | INTRAMUSCULAR | Status: AC
  Administered 2013-09-13: 4 mg via INTRAVENOUS
  Filled 2013-09-13: qty 2

## 2013-09-13 MED ORDER — METOPROLOL TARTRATE 25 MG PO TABS
25.0000 mg | ORAL_TABLET | Freq: Once | ORAL | Status: DC
  Filled 2013-09-13: qty 1

## 2013-09-13 MED ORDER — METOCLOPRAMIDE HCL 5 MG/ML IJ SOLN
10.0000 mg | Freq: Once | INTRAMUSCULAR | Status: AC
  Administered 2013-09-13: 10 mg via INTRAVENOUS
  Filled 2013-09-13: qty 2

## 2013-09-13 NOTE — ED Provider Notes (Addendum)
CSN: [id number]     Arrival date & time 09/13/13  1036 History   First MD Initiated Contact with Patient 09/13/13 1109     Chief Complaint  Patient presents with  . Headache   (Consider location/radiation/quality/duration/timing/severity/associated sxs/prior Treatment) HPI Comments: Pt with hx of DM, HTN comes in with cc of headache. Pt's headache started y'day evening, and was rated as 2/10 at onset, and gradually got worse. She was unable to sleep due to the headache - and around 8 am her headache got to 10/10 - so she decided to come to the ED. The headache is left sided, diffuse, throbbing with associated nausea. Pt states that her headache is worse with laying supine, there is no photophobia, phonophobia. No family hx of migraines, brain CA, aneurysms. She had a similar headache 2 years ago which resolved with meds in the ED. Pt has no associated vomiting, visual complains, seizures, altered mental status, loss of consciousness, new weakness, or numbness, or gait instability. She is also noted to have elevated BP 220/129 in the triage. Pt has no chest pain, dib.   Patient is a 37 y.o. female presenting with headaches. The history is provided by the patient.  Headache Associated symptoms: dizziness   Associated symptoms: no abdominal pain, no congestion, no fever, no nausea, no neck pain, no neck stiffness, no numbness and no vomiting     Past Medical History  Diagnosis Date  . Anemia   . Hypertension    History reviewed. No pertinent past surgical history. History reviewed. No pertinent family history. History  Substance Use Topics  . Smoking status: Never Smoker   . Smokeless tobacco: Never Used  . Alcohol Use: No   OB History   Grav Para Term Preterm Abortions TAB SAB Ect Mult Living   6 6 6  0 0 0 0 0 0 6     Review of Systems  Constitutional: Positive for activity change. Negative for fever.  HENT: Negative for congestion.   Eyes: Negative for visual disturbance.   Respiratory: Negative for shortness of breath.   Cardiovascular: Negative for chest pain.  Gastrointestinal: Negative for nausea, vomiting and abdominal pain.  Genitourinary: Negative for dysuria.  Musculoskeletal: Negative for neck pain and neck stiffness.  Skin: Negative for rash.  Neurological: Positive for dizziness, light-headedness and headaches. Negative for weakness and numbness.  Hematological: Does not bruise/bleed easily.  Psychiatric/Behavioral: Negative for confusion.    Allergies  Review of patient's allergies indicates no known allergies.  Home Medications   Current Outpatient Rx  Name  Route  Sig  Dispense  Refill  . acyclovir (ZOVIRAX) 400 MG tablet   Oral   Take 1 tablet (400 mg total) by mouth 3 (three) times daily.   30 tablet   0   . levonorgestrel (MIRENA) 20 MCG/24HR IUD   Intrauterine   1 each by Intrauterine route once.         . metoprolol tartrate (LOPRESSOR) 25 MG tablet   Oral   Take 1 tablet (25 mg total) by mouth 2 (two) times daily.   60 tablet   11   . triamterene-hydrochlorothiazide (MAXZIDE-25) 37.5-25 MG per tablet   Oral   Take 1 each (1 tablet total) by mouth daily.   30 tablet   6   . EXPIRED: diphenhydrAMINE (BENADRYL) 50 MG tablet   Oral   Take 1 tablet (50 mg total) by mouth at bedtime as needed for itching.   30 tablet   0   .  ibuprofen (ADVIL,MOTRIN) 600 MG tablet   Oral   Take 1 tablet (600 mg total) by mouth every 6 (six) hours as needed.   30 tablet   0   . promethazine (PHENERGAN) 25 MG tablet   Oral   Take 1 tablet (25 mg total) by mouth every 6 (six) hours as needed for nausea.   30 tablet   0    BP 174/102  Pulse 79  Temp(Src) 97.8 F (36.6 C) (Oral)  Resp 16  SpO2 97%  LMP 09/06/2013 Physical Exam  Nursing note and vitals reviewed. Constitutional: She is oriented to person, place, and time. She appears well-developed and well-nourished.  HENT:  Head: Normocephalic and atraumatic.  Eyes: EOM  are normal. Pupils are equal, round, and reactive to light.  Neck: Neck supple.  Cardiovascular: Normal rate, regular rhythm and normal heart sounds.   No murmur heard. Pulmonary/Chest: Effort normal. No respiratory distress.  Abdominal: Soft. She exhibits no distension. There is no tenderness. There is no rebound and no guarding.  Neurological: She is alert and oriented to person, place, and time.  Cerebellar exam is normal (finger to nose) Sensory exam normal for bilateral upper and lower extremities - and patient is able to discriminate between sharp and dull. Motor exam is 4+/5   Skin: Skin is warm and dry.    ED Course  Procedures (including critical care time) Labs Review Labs Reviewed  BASIC METABOLIC PANEL - Abnormal; Notable for the following:    Potassium 3.3 (*)    Glucose, Bld 107 (*)    All other components within normal limits  CBC WITH DIFFERENTIAL   Imaging Review Ct Head Wo Contrast  09/13/2013   CLINICAL DATA:  Severe headache with nausea ; hypertension  EXAM: CT HEAD WITHOUT CONTRAST  TECHNIQUE: Contiguous axial images were obtained from the base of the skull through the vertex without intravenous contrast. Study was obtained within 24 hr of patient's arrival at the emergency department.  COMPARISON:  January 12, 2012  FINDINGS: The ventricles are normal in size and configuration. There is no mass, hemorrhage, extra-axial fluid collection, or midline shift. There is evidence of a prior small lacunar infarct along the inferolateral right lentiform nucleus, stable. Elsewhere gray-white compartments are normal. No acute infarct apparent.  The bony calvarium appears intact. The mastoid air cells are clear. There is ethmoid air cell disease on the right posteriorly. There is edema of the nasal terminates bilaterally.  IMPRESSION: Stable prior small infarct in the inferolateral right lentiform nucleus. No intracranial mass, hemorrhage, or acute appearing infarct. There is  bilateral nasal turbinate edema as well as right-sided ethmoid sinus disease.   Electronically Signed   By: Bretta Bang M.D.   On: 09/13/2013 12:03    EKG Interpretation   None       MDM   1. Headache    Pt comes in with cc of headaches and elevated BP.  DDX includes: Primary headaches - including migrainous headaches, cluster headaches, tension headaches. Hypertensive emergency ICH Carotid dissection Cavernous sinus thrombosis Meningitis Encephalitis Tumor Vascular headaches AV malformation Brain aneurysm Muscular headaches  A/P:  Headaches mild at onset, and have some features of migrainous headache - unilateral, throbbing, and with nausea. However, no photophobia/phonophobia/aura. No hx of migraines. Reluctant to call this migrainous headache - especially in light of elevated BP. With the elevated BP, patient has no chest pain, dib. Pt told me that she didn't take her AM meds today - so we gave  her metoprolol po initially -and BP improved to 180/110. Pt was given toradol and reglan - and her headache completely ceased.  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  Currently Pt's vitals with just 25 mg metoprolol oral and pain control is:  Filed Vitals:   09/13/13 1541  BP: 182/101  Pulse: 67  Temp: 98.2 F (36.8 C)  Resp: 16    Given that the headache is worse with laying down  - this could be Idiopathic intracranial hypertension. Additionally, she has a remote stroke - so i think a Neuro visit will be benefical for risk stratification of stroke and headache workup.  For the BP - she will get another 25 oral metop prior to d/c. We have advised her to see her pcp for optimal care, and return to the ER if the sx get worse. I have also told her to take ASA q daily.  Derwood Kaplan, MD 09/13/13 1611  4:24 PM RN informs me that the metop 25 mg was never given in 1st place. So she will go home after  getting 1 dose of metop.  Derwood Kaplan, MD 09/13/13 [phone]

## 2013-09-13 NOTE — ED Notes (Signed)
Pt states that she has had this type of headache before and was given a shot in her buttocks that put her to sleep and the headache pain was relieved

## 2013-09-13 NOTE — ED Notes (Addendum)
Reports severe headache since last night with nausea, no vomiting. Hx of migraines. No relief with ibuprofen. Hypertensive at triage, reports taking bp meds as prescribed

## 2013-11-03 ENCOUNTER — Telehealth: Payer: Self-pay | Admitting: Family Medicine

## 2013-11-03 NOTE — Telephone Encounter (Signed)
Fwd to PCP

## 2013-11-03 NOTE — Telephone Encounter (Signed)
Pt called and needs a refill on her BP medication. jw °

## 2013-11-05 MED ORDER — TRIAMTERENE-HCTZ 37.5-25 MG PO TABS: 1.0000 | ORAL_TABLET | Freq: Every day | ORAL | Status: DC

## 2013-11-05 MED ORDER — METOPROLOL TARTRATE 25 MG PO TABS: 25.0000 mg | ORAL_TABLET | Freq: Two times a day (BID) | ORAL | Status: DC

## 2013-11-05 NOTE — Telephone Encounter (Signed)
Completed, ready for pick up.  Thanks, Twana FirstBryan R. Paulina FusiHess, DO of [facility] 11/05/2013, 9:47 AM

## 2013-11-06 NOTE — Telephone Encounter (Signed)
All circuits are busy message

## 2013-11-08 NOTE — Telephone Encounter (Signed)
Spoke with patient and informed her of below. She also is aware that she has an appointment with her new PCP tomorrow at 2:30p.

## 2013-11-09 ENCOUNTER — Ambulatory Visit: Payer: Medicaid Other | Admitting: Family Medicine

## 2014-01-06 ENCOUNTER — Emergency Department (HOSPITAL_COMMUNITY)
Admission: EM | Admit: 2014-01-06 | Discharge: 2014-01-06 | Disposition: A | Discharge status: Home / Self Care | Payer: Medicaid Other | Attending: Emergency Medicine | Admitting: Emergency Medicine

## 2014-01-06 DIAGNOSIS — Z79899 Other long term (current) drug therapy: Secondary | ICD-10-CM | POA: Insufficient documentation

## 2014-01-06 DIAGNOSIS — Z7982 Long term (current) use of aspirin: Secondary | ICD-10-CM | POA: Insufficient documentation

## 2014-01-06 DIAGNOSIS — I10 Essential (primary) hypertension: Secondary | ICD-10-CM | POA: Insufficient documentation

## 2014-01-06 DIAGNOSIS — Z862 Personal history of diseases of the blood and blood-forming organs and certain disorders involving the immune mechanism: Secondary | ICD-10-CM | POA: Insufficient documentation

## 2014-01-06 DIAGNOSIS — M62838 Other muscle spasm: Secondary | ICD-10-CM

## 2014-01-06 MED ORDER — KETOROLAC TROMETHAMINE 30 MG/ML IJ SOLN
30.0000 mg | Freq: Once | INTRAMUSCULAR | Status: AC
  Administered 2014-01-06: 30 mg via INTRAMUSCULAR
  Filled 2014-01-06: qty 1

## 2014-01-06 MED ORDER — DIAZEPAM 5 MG PO TABS: 5.0000 mg | ORAL_TABLET | Freq: Two times a day (BID) | ORAL | Status: DC | PRN

## 2014-01-06 NOTE — ED Notes (Signed)
Pt denies any recent falls or injury to left hip.

## 2014-01-06 NOTE — ED Provider Notes (Signed)
CSN: [id number]     Arrival date & time 01/06/14  2056 History  This chart was scribed for non-physician practitioner working with Hilario Quarryanielle S Ray, MD by Elveria Risingimelie Horne, ED Scribe. This patient was seen in room TR05C/TR05C and the patient's care was started at 9:58 PM.   Chief Complaint  Patient presents with  . Hip Pain  . Back Pain      The history is provided by the patient. No language interpreter was used.   HPI Comments: Tamara Cromerameka L Church is a 38 y.o. female who presents to the Emergency Department complaining of graudally worsening left lower back pain, onset yesterday while sitting. Patient initially thought she slept on her back wrong. Today patient reports the pain reached intolerable measures and she can't walk or sit comfortably without pain. Patient reports that when she is able to stretch the left side of her back she is most comfortable. She has attempted treatment with ibuprofen, but has no experienced no relief. Patient denies hip pain.  Denies bowel or bladder incontinence, weakness, or saddle anesthesia.   Past Medical History  Diagnosis Date  . Anemia   . Hypertension    No past surgical history on file. No family history on file. History  Substance Use Topics  . Smoking status: Never Smoker   . Smokeless tobacco: Never Used  . Alcohol Use: No   OB History   Grav Para Term Preterm Abortions TAB SAB Ect Mult Living   6 6 6  0 0 0 0 0 0 6     Review of Systems  Constitutional: Negative for fever and chills.  Gastrointestinal:       No bowel incontinence  Genitourinary:       No urinary incontinence  Musculoskeletal: Positive for arthralgias, back pain and myalgias.  Neurological:       No saddle anesthesia      Allergies  Review of patient's allergies indicates no known allergies.  Home Medications   Prior to Admission medications   Medication Sig Start Date End Date Taking? Authorizing Provider  acyclovir (ZOVIRAX) 400 MG tablet Take 1 tablet (400 mg  total) by mouth 3 (three) times daily. 10/23/12   Heather Alger Memosonovan Hogan, CNM  aspirin EC 81 MG tablet Take 1 tablet (81 mg total) by mouth daily. 09/13/13   Derwood KaplanAnkit Nanavati, MD  diphenhydrAMINE (BENADRYL) 50 MG tablet Take 1 tablet (50 mg total) by mouth at bedtime as needed for itching. 03/23/12 04/22/12  Ellison CarwinJosalyn C Funches, MD  ibuprofen (ADVIL,MOTRIN) 600 MG tablet Take 1 tablet (600 mg total) by mouth every 6 (six) hours as needed. 09/13/13   Derwood KaplanAnkit Nanavati, MD  levonorgestrel (MIRENA) 20 MCG/24HR IUD 1 each by Intrauterine route once.    Historical Provider, MD  metoprolol tartrate (LOPRESSOR) 25 MG tablet Take 1 tablet (25 mg total) by mouth 2 (two) times daily. 11/05/13   Briscoe DeutscherBryan R Hess, DO  promethazine (PHENERGAN) 25 MG tablet Take 1 tablet (25 mg total) by mouth every 6 (six) hours as needed for nausea. 09/13/13   Derwood KaplanAnkit Nanavati, MD  triamterene-hydrochlorothiazide (MAXZIDE-25) 37.5-25 MG per tablet Take 1 tablet by mouth daily. 11/05/13   Briscoe DeutscherBryan R Hess, DO   Triage Vitals: BP 175/119  Pulse 89  Temp(Src) 98.6 F (37 C) (Oral)  Resp 20  Ht 5\' 2"  (1.575 m)  Wt 163 lb (73.936 kg)  BMI 29.81 kg/m2  SpO2 100%  LMP 12/12/2013 Physical Exam  Nursing note and vitals reviewed. Constitutional: She is oriented to  person, place, and time. She appears well-developed and well-nourished. No distress.  HENT:  Head: Normocephalic and atraumatic.  Eyes: Conjunctivae and EOM are normal. Right eye exhibits no discharge. Left eye exhibits no discharge. No scleral icterus.  Neck: Normal range of motion. Neck supple. No tracheal deviation present.  Cardiovascular: Normal rate, regular rhythm and normal heart sounds.  Exam reveals no gallop and no friction rub.   No murmur heard. Pulmonary/Chest: Effort normal and breath sounds normal. No respiratory distress. She has no wheezes.  Abdominal: Soft. She exhibits no distension. There is no tenderness.  Musculoskeletal: Normal range of motion.  Left sided lumbar  paraspinal muscles tender to palpation, no bony tenderness, step-offs, or gross abnormality or deformity of spine, patient is able to ambulate, moves all extremities  Bilateral great toe extension intact Bilateral plantar/dorsiflexion intact  Neurological: She is alert and oriented to person, place, and time. She has normal reflexes.  Sensation and strength intact bilaterally Symmetrical reflexes  Skin: Skin is warm. She is not diaphoretic.  Psychiatric: She has a normal mood and affect. Her behavior is normal. Judgment and thought content normal.    ED Course  Procedures (including critical care time) DIAGNOSTIC STUDIES: Oxygen Saturation is 100% on room air, nomral by my interpretation.    COORDINATION OF CARE: 10:04 PM- Discussed treatment plan with patient at bedside and patient agreed to plan.     Labs Review Labs Reviewed - No data to display  Imaging Review No results found.   EKG Interpretation None      MDM   Final diagnoses:  Muscle spasm    Patient with back pain.  No neurological deficits and normal neuro exam.  Patient is ambulatory.  No loss of bowel or bladder control.  Doubt cauda equina.  Denies fever,  doubt epidural abscess or other lesion. Recommend back exercises, stretching, RICE, and will treat with a short course of a muscle relaxer.     I personally performed the services described in this documentation, which was scribed in my presence. The recorded information has been reviewed and is accurate.     Roxy Horsemanobert Kamarie Palma, PA-C 01/06/14 2210

## 2014-01-06 NOTE — ED Notes (Signed)
Pt c/o left hip pain radiating to left back. Pt reports onset yesterday while sitting. Pt states pain has gradually increased to a point where she cannot tolerate the pain. Pt reports taking Ibuprofen today without relief. Pt is A&Ox4, respirations equal and unlabored, skin warm and dry.

## 2014-01-06 NOTE — Discharge Instructions (Signed)
Muscle Cramps and Spasms Muscle cramps and spasms are when muscles tighten by themselves. They usually get better within minutes. Muscle cramps are painful. They are usually stronger and last longer than muscle spasms. Muscle spasms may or may not be painful. They can last a few seconds or much longer. HOME CARE  Drink enough fluid to keep your pee (urine) clear or pale yellow.  Massage, stretch, and relax the muscle.  Use a warm towel, heating pad, or warm shower water on tight muscles.  Place ice on the muscle if it is tender or in pain.  Put ice in a plastic bag.  Place a towel between your skin and the bag.  Leave the ice on for 15-20 minutes, 03-04 times a day.  Only take medicine as told by your doctor. GET HELP RIGHT AWAY IF:  Your cramps or spasms get worse, happen more often, or do not get better with time. MAKE SURE YOU:  Understand these instructions.  Will watch your condition.  Will get help right away if you are not doing well or get worse. Document Released: 08/20/2008 Document Revised: 01/02/2013 Document Reviewed: 08/24/2012 [facility] Patient Information 2014 [place], [place].  Back Pain, Adult Back pain is very common. The pain often gets better over time. The cause of back pain is usually not dangerous. Most people can learn to manage their back pain on their own.  HOME CARE   Stay active. Start with short walks on flat ground if you can. Try to walk farther each day.  Do not sit, drive, or stand in one place for more than 30 minutes. Do not stay in bed.  Do not avoid exercise or work. Activity can help your back heal faster.  Be careful when you bend or lift an object. Bend at your knees, keep the object close to you, and do not twist.  Sleep on a firm mattress. Lie on your side, and bend your knees. If you lie on your back, put a pillow under your knees.  Only take medicines as told by your doctor.  Put ice on the injured area.  Put ice in a plastic  bag.  Place a towel between your skin and the bag.  Leave the ice on for 15-20 minutes, 03-04 times a day for the first 2 to 3 days. After that, you can switch between ice and heat packs.  Ask your doctor about back exercises or massage.  Avoid feeling anxious or stressed. Find good ways to deal with stress, such as exercise. GET HELP RIGHT AWAY IF:   Your pain does not go away with rest or medicine.  Your pain does not go away in 1 week.  You have new problems.  You do not feel well.  The pain spreads into your legs.  You cannot control when you poop (bowel movement) or pee (urinate).  Your arms or legs feel weak or lose feeling (numbness).  You feel sick to your stomach (nauseous) or throw up (vomit).  You have belly (abdominal) pain.  You feel like you may pass out (faint). MAKE SURE YOU:   Understand these instructions.  Will watch your condition.  Will get help right away if you are not doing well or get worse. Document Released: 02/24/2008 Document Revised: 11/30/2011 Document Reviewed: 01/26/2011 [facility] Patient Information 2014 [place], [place]. Back Exercises Back exercises help treat and prevent back injuries. The goal of back exercises is to increase the strength of your abdominal and back muscles and the  flexibility of your back. These exercises should be started when you no longer have back pain. Back exercises include:  Pelvic Tilt. Lie on your back with your knees bent. Tilt your pelvis until the lower part of your back is against the floor. Hold this position 5 to 10 sec and repeat 5 to 10 times.  Knee to Chest. Pull first 1 knee up against your chest and hold for 20 to 30 seconds, repeat this with the other knee, and then both knees. This may be done with the other leg straight or bent, whichever feels better.  Sit-Ups or Curl-Ups. Bend your knees 90 degrees. Start with tilting your pelvis, and do a partial, slow sit-up, lifting your trunk only 30 to 45  degrees off the floor. Take at least 2 to 3 seconds for each sit-up. Do not do sit-ups with your knees out straight. If partial sit-ups are difficult, simply do the above but with only tightening your abdominal muscles and holding it as directed.  Hip-Lift. Lie on your back with your knees flexed 90 degrees. Push down with your feet and shoulders as you raise your hips a couple inches off the floor; hold for 10 seconds, repeat 5 to 10 times.  Back arches. Lie on your stomach, propping yourself up on bent elbows. Slowly press on your hands, causing an arch in your low back. Repeat 3 to 5 times. Any initial stiffness and discomfort should lessen with repetition over time.  Shoulder-Lifts. Lie face down with arms beside your body. Keep hips and torso pressed to floor as you slowly lift your head and shoulders off the floor. Do not overdo your exercises, especially in the beginning. Exercises may cause you some mild back discomfort which lasts for a few minutes; however, if the pain is more severe, or lasts for more than 15 minutes, do not continue exercises until you see your caregiver. Improvement with exercise therapy for back problems is slow.  See your caregivers for assistance with developing a proper back exercise program. Document Released: 10/15/2004 Document Revised: 11/30/2011 Document Reviewed: 07/09/2011 [facility] Patient Information 2014 [place], [place].

## 2014-01-06 NOTE — ED Notes (Signed)
Pt c/o left lower back pain onset yesterday.  No known injury.  Pain worse with movement.

## 2014-01-07 NOTE — ED Provider Notes (Signed)
History/physical exam/procedure(s) were performed by non-physician practitioner and as supervising physician I was immediately available for consultation/collaboration. I have reviewed all notes and am in agreement with care and plan.   Hilario Quarryanielle S Skyrah Krupp, MD 01/07/14 [phone]

## 2014-07-19 ENCOUNTER — Other Ambulatory Visit: Payer: Self-pay | Admitting: Family Medicine

## 2014-07-19 MED ORDER — METOPROLOL TARTRATE 25 MG PO TABS: 25.0000 mg | ORAL_TABLET | Freq: Two times a day (BID) | ORAL | Status: DC

## 2014-07-19 NOTE — Telephone Encounter (Signed)
Pt has not been seen in clinic in over 2.5 yrs.  Please let her know she needs an appointment.  I will fill for 2 week period.  Thanks, Twana FirstBryan R. Paulina FusiHess, DO [facility] 07/19/2014, 12:22 PM

## 2014-07-19 NOTE — Telephone Encounter (Signed)
Pt called and needs a refill on her BP medication. She is completely out. Myriam Jacobsonjw

## 2014-07-20 NOTE — Telephone Encounter (Signed)
LMOVM for pt to return call .Fleeger, Jessica Dawn  

## 2014-07-23 ENCOUNTER — Encounter (HOSPITAL_COMMUNITY): Payer: Self-pay | Admitting: Emergency Medicine

## 2014-10-04 ENCOUNTER — Emergency Department (HOSPITAL_COMMUNITY)
Admission: EM | Admit: 2014-10-04 | Discharge: 2014-10-04 | Disposition: A | Discharge status: Home / Self Care | Payer: Medicaid Other | Attending: Emergency Medicine | Admitting: Emergency Medicine

## 2014-10-04 ENCOUNTER — Other Ambulatory Visit: Payer: Self-pay | Admitting: Family Medicine

## 2014-10-04 ENCOUNTER — Encounter (HOSPITAL_COMMUNITY): Payer: Self-pay | Admitting: Emergency Medicine

## 2014-10-04 DIAGNOSIS — I10 Essential (primary) hypertension: Secondary | ICD-10-CM | POA: Diagnosis not present

## 2014-10-04 DIAGNOSIS — S29001A Unspecified injury of muscle and tendon of front wall of thorax, initial encounter: Secondary | ICD-10-CM | POA: Insufficient documentation

## 2014-10-04 DIAGNOSIS — Y9389 Activity, other specified: Secondary | ICD-10-CM | POA: Insufficient documentation

## 2014-10-04 DIAGNOSIS — Z041 Encounter for examination and observation following transport accident: Secondary | ICD-10-CM

## 2014-10-04 DIAGNOSIS — Y998 Other external cause status: Secondary | ICD-10-CM | POA: Diagnosis not present

## 2014-10-04 DIAGNOSIS — S3991XA Unspecified injury of abdomen, initial encounter: Secondary | ICD-10-CM | POA: Diagnosis not present

## 2014-10-04 DIAGNOSIS — Z862 Personal history of diseases of the blood and blood-forming organs and certain disorders involving the immune mechanism: Secondary | ICD-10-CM | POA: Insufficient documentation

## 2014-10-04 DIAGNOSIS — Y92410 Unspecified street and highway as the place of occurrence of the external cause: Secondary | ICD-10-CM | POA: Diagnosis not present

## 2014-10-04 DIAGNOSIS — Z79899 Other long term (current) drug therapy: Secondary | ICD-10-CM | POA: Diagnosis not present

## 2014-10-04 DIAGNOSIS — Z793 Long term (current) use of hormonal contraceptives: Secondary | ICD-10-CM | POA: Diagnosis not present

## 2014-10-04 DIAGNOSIS — S199XXA Unspecified injury of neck, initial encounter: Secondary | ICD-10-CM | POA: Diagnosis present

## 2014-10-04 DIAGNOSIS — Z7982 Long term (current) use of aspirin: Secondary | ICD-10-CM | POA: Diagnosis not present

## 2014-10-04 DIAGNOSIS — S0993XA Unspecified injury of face, initial encounter: Secondary | ICD-10-CM | POA: Diagnosis not present

## 2014-10-04 DIAGNOSIS — Z791 Long term (current) use of non-steroidal anti-inflammatories (NSAID): Secondary | ICD-10-CM | POA: Diagnosis not present

## 2014-10-04 MED ORDER — IBUPROFEN 600 MG PO TABS: 600.0000 mg | ORAL_TABLET | Freq: Four times a day (QID) | ORAL | Status: DC | PRN

## 2014-10-04 MED ORDER — HYDROCHLOROTHIAZIDE 25 MG PO TABS: 25.0000 mg | ORAL_TABLET | Freq: Every day | ORAL | Status: DC

## 2014-10-04 MED ORDER — METHOCARBAMOL 500 MG PO TABS: 500.0000 mg | ORAL_TABLET | Freq: Two times a day (BID) | ORAL | Status: DC
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
## 2014-10-04 NOTE — ED Notes (Signed)
Pt [facility] provided Rx.

## 2014-10-04 NOTE — Discharge Instructions (Signed)
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
## 2014-10-04 NOTE — Telephone Encounter (Signed)
Pt called and needs a refill on her BP medication called in. jw °

## 2014-11-13 ENCOUNTER — Encounter (HOSPITAL_COMMUNITY): Payer: Self-pay | Admitting: Physical Medicine and Rehabilitation

## 2014-11-13 ENCOUNTER — Emergency Department (HOSPITAL_COMMUNITY)
Admission: EM | Admit: 2014-11-13 | Discharge: 2014-11-13 | Disposition: A | Discharge status: Home / Self Care | Payer: Self-pay | Attending: Emergency Medicine | Admitting: Emergency Medicine

## 2014-11-13 ENCOUNTER — Emergency Department (HOSPITAL_COMMUNITY): Payer: Medicaid Other

## 2014-11-13 DIAGNOSIS — H5509 Other forms of nystagmus: Secondary | ICD-10-CM | POA: Insufficient documentation

## 2014-11-13 DIAGNOSIS — R42 Dizziness and giddiness: Secondary | ICD-10-CM

## 2014-11-13 DIAGNOSIS — Z7982 Long term (current) use of aspirin: Secondary | ICD-10-CM | POA: Insufficient documentation

## 2014-11-13 DIAGNOSIS — Z862 Personal history of diseases of the blood and blood-forming organs and certain disorders involving the immune mechanism: Secondary | ICD-10-CM | POA: Insufficient documentation

## 2014-11-13 DIAGNOSIS — R51 Headache: Secondary | ICD-10-CM

## 2014-11-13 DIAGNOSIS — Z79899 Other long term (current) drug therapy: Secondary | ICD-10-CM | POA: Insufficient documentation

## 2014-11-13 DIAGNOSIS — I10 Essential (primary) hypertension: Secondary | ICD-10-CM | POA: Insufficient documentation

## 2014-11-13 DIAGNOSIS — Z3202 Encounter for pregnancy test, result negative: Secondary | ICD-10-CM | POA: Insufficient documentation

## 2014-11-13 DIAGNOSIS — R519 Headache, unspecified: Secondary | ICD-10-CM

## 2014-11-13 LAB — COMPREHENSIVE METABOLIC PANEL
ALT: 16 U/L (ref 0–35)
AST: 21 U/L (ref 0–37)
Albumin: 4.4 g/dL (ref 3.5–5.2)
Alkaline Phosphatase: 60 U/L (ref 39–117)
Anion gap: 8 (ref 5–15)
BUN: 9 mg/dL (ref 6–23)
CALCIUM: 9.5 mg/dL (ref 8.4–10.5)
CO2: 27 mmol/L (ref 19–32)
Chloride: 102 mmol/L (ref 96–112)
Creatinine, Ser: 1.03 mg/dL (ref 0.50–1.10)
GFR calc non Af Amer: 69 mL/min — ABNORMAL LOW (ref >90)
GFR, EST AFRICAN AMERICAN: 79 mL/min — AB (ref >90)
GLUCOSE: 125 mg/dL — AB (ref 70–99)
Potassium: 3.1 mmol/L — ABNORMAL LOW (ref 3.5–5.1)
SODIUM: 137 mmol/L (ref 135–145)
TOTAL PROTEIN: 8.5 g/dL — AB (ref 6.0–8.3)
Total Bilirubin: 0.5 mg/dL (ref 0.3–1.2)

## 2014-11-13 LAB — URINE MICROSCOPIC-ADD ON

## 2014-11-13 LAB — URINALYSIS, ROUTINE W REFLEX MICROSCOPIC
BILIRUBIN URINE: NEGATIVE
Glucose, UA: NEGATIVE mg/dL
KETONES UR: NEGATIVE mg/dL
Leukocytes, UA: NEGATIVE
NITRITE: NEGATIVE
PH: 6 (ref 5.0–8.0)
Protein, ur: NEGATIVE mg/dL
Specific Gravity, Urine: 1.008 (ref 1.005–1.030)
Urobilinogen, UA: 0.2 mg/dL (ref 0.0–1.0)

## 2014-11-13 LAB — I-STAT TROPONIN, ED: Troponin i, poc: 0 ng/mL (ref 0.00–0.08)

## 2014-11-13 LAB — CBC WITH DIFFERENTIAL/PLATELET
Basophils Absolute: 0 10*3/uL (ref 0.0–0.1)
Basophils Relative: 1 % (ref 0–1)
EOS ABS: 0.1 10*3/uL (ref 0.0–0.7)
EOS PCT: 1 % (ref 0–5)
HEMATOCRIT: 37.5 % (ref 36.0–46.0)
HEMOGLOBIN: 12.4 g/dL (ref 12.0–15.0)
LYMPHS ABS: 1.7 10*3/uL (ref 0.7–4.0)
LYMPHS PCT: 35 % (ref 12–46)
MCH: 28.4 pg (ref 26.0–34.0)
MCHC: 33.1 g/dL (ref 30.0–36.0)
MCV: 86 fL (ref 78.0–100.0)
MONO ABS: 0.3 10*3/uL (ref 0.1–1.0)
MONOS PCT: 6 % (ref 3–12)
NEUTROS ABS: 2.7 10*3/uL (ref 1.7–7.7)
Neutrophils Relative %: 57 % (ref 43–77)
Platelets: 244 10*3/uL (ref 150–400)
RBC: 4.36 MIL/uL (ref 3.87–5.11)
RDW: 13.4 % (ref 11.5–15.5)
WBC: 4.7 10*3/uL (ref 4.0–10.5)

## 2014-11-13 LAB — POC URINE PREG, ED: PREG TEST UR: NEGATIVE

## 2014-11-13 MED ORDER — METOCLOPRAMIDE HCL 10 MG PO TABS: 10.0000 mg | ORAL_TABLET | Freq: Four times a day (QID) | ORAL | Status: DC | PRN

## 2014-11-13 MED ORDER — SODIUM CHLORIDE 0.9 % IV BOLUS (SEPSIS): 1000.0000 mL | Freq: Once | INTRAVENOUS | Status: DC

## 2014-11-13 MED ORDER — DIPHENHYDRAMINE HCL 50 MG/ML IJ SOLN
25.0000 mg | Freq: Once | INTRAMUSCULAR | Status: AC
  Administered 2014-11-13: 25 mg via INTRAVENOUS
  Filled 2014-11-13: qty 1

## 2014-11-13 MED ORDER — HYDROCHLOROTHIAZIDE 25 MG PO TABS
25.0000 mg | ORAL_TABLET | Freq: Every day | ORAL | Status: DC
  Administered 2014-11-13: 25 mg via ORAL
  Filled 2014-11-13: qty 1

## 2014-11-13 MED ORDER — POTASSIUM CHLORIDE CRYS ER 20 MEQ PO TBCR
40.0000 meq | EXTENDED_RELEASE_TABLET | Freq: Once | ORAL | Status: AC
  Administered 2014-11-13: 40 meq via ORAL
  Filled 2014-11-13: qty 2

## 2014-11-13 MED ORDER — LISINOPRIL 10 MG PO TABS: 10.0000 mg | ORAL_TABLET | Freq: Every day | ORAL | Status: DC

## 2014-11-13 MED ORDER — MECLIZINE HCL 25 MG PO TABS
25.0000 mg | ORAL_TABLET | Freq: Once | ORAL | Status: AC
Start: 2014-11-13 — End: 2014-11-13
  Administered 2014-11-13: 25 mg via ORAL
  Filled 2014-11-13: qty 1

## 2014-11-13 MED ORDER — METOCLOPRAMIDE HCL 5 MG/ML IJ SOLN
10.0000 mg | Freq: Once | INTRAMUSCULAR | Status: AC
  Administered 2014-11-13: 10 mg via INTRAVENOUS
  Filled 2014-11-13: qty 2

## 2014-11-13 MED ORDER — MECLIZINE HCL 25 MG PO TABS: 25.0000 mg | ORAL_TABLET | Freq: Three times a day (TID) | ORAL | Status: DC | PRN

## 2014-11-13 NOTE — ED Notes (Signed)
Pt presents to department for evaluation of headache, near syncope and nausea. Onset Sunday. Denies LOC. 8/10 headache upon arrival to ED. She is alert and oriented x4.
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
## 2014-11-13 NOTE — Discharge Instructions (Signed)
Take lisinopril 10 mg daily.   Take meclizine for dizziness.  Take tylenol for headaches. Take reglan for severe headaches.   See your doctor in a week to recheck blood pressure.   Return to ER if you have severe headaches, dizziness, passing out, chest pain, vomiting, weakness.

## 2014-12-04 ENCOUNTER — Telehealth: Payer: Self-pay | Admitting: Family Medicine

## 2014-12-04 NOTE — Telephone Encounter (Signed)
Pt needs HCTZ to last her to her appt on March 28

## 2014-12-05 MED ORDER — HYDROCHLOROTHIAZIDE 12.5 MG PO TABS
12.5000 mg | ORAL_TABLET | Freq: Every day | ORAL | Status: DC
Start: 2014-12-05 — End: 2014-12-25

## 2014-12-05 NOTE — Telephone Encounter (Signed)
LVM for pt to call back to inform her of below. Zimmerman Rumple, April D  

## 2014-12-05 NOTE — Telephone Encounter (Signed)
30 day refill sent in.  Twana FirstBryan R. Paulina FusiHess, [facility] 12/05/2014, 9:36 AM

## 2014-12-10 NOTE — Telephone Encounter (Signed)
LVM for pt to return call to be sure that she was aware of her refill that had been sent in. [name], Tamara Church D

## 2014-12-11 ENCOUNTER — Encounter (HOSPITAL_COMMUNITY): Payer: Self-pay | Admitting: *Deleted

## 2014-12-11 ENCOUNTER — Inpatient Hospital Stay (HOSPITAL_COMMUNITY)
Admission: AD | Admit: 2014-12-11 | Discharge: 2014-12-11 | Disposition: A | Discharge status: Home / Self Care | Payer: Medicaid Other | Source: Ambulatory Visit | Attending: Family Medicine | Admitting: Family Medicine

## 2014-12-11 DIAGNOSIS — I10 Essential (primary) hypertension: Secondary | ICD-10-CM | POA: Diagnosis not present

## 2014-12-11 DIAGNOSIS — T148XXA Other injury of unspecified body region, initial encounter: Secondary | ICD-10-CM

## 2014-12-11 DIAGNOSIS — Z7982 Long term (current) use of aspirin: Secondary | ICD-10-CM | POA: Insufficient documentation

## 2014-12-11 DIAGNOSIS — R1031 Right lower quadrant pain: Secondary | ICD-10-CM | POA: Insufficient documentation

## 2014-12-11 DIAGNOSIS — Z79899 Other long term (current) drug therapy: Secondary | ICD-10-CM | POA: Insufficient documentation

## 2014-12-11 LAB — URINALYSIS, ROUTINE W REFLEX MICROSCOPIC
Bilirubin Urine: NEGATIVE
Glucose, UA: NEGATIVE mg/dL
KETONES UR: NEGATIVE mg/dL
LEUKOCYTES UA: NEGATIVE
NITRITE: NEGATIVE
Protein, ur: NEGATIVE mg/dL
Specific Gravity, Urine: 1.015 (ref 1.005–1.030)
Urobilinogen, UA: 0.2 mg/dL (ref 0.0–1.0)
pH: 6 (ref 5.0–8.0)

## 2014-12-11 LAB — URINE MICROSCOPIC-ADD ON

## 2014-12-11 LAB — POCT PREGNANCY, URINE: Preg Test, Ur: NEGATIVE

## 2014-12-11 MED ORDER — KETOROLAC TROMETHAMINE 60 MG/2ML IM SOLN
60.0000 mg | Freq: Once | INTRAMUSCULAR | Status: AC
  Administered 2014-12-11: 60 mg via INTRAMUSCULAR
  Filled 2014-12-11: qty 2

## 2014-12-11 MED ORDER — CYCLOBENZAPRINE HCL 10 MG PO TABS: 10.0000 mg | ORAL_TABLET | Freq: Three times a day (TID) | ORAL | Status: DC | PRN

## 2014-12-11 MED ORDER — IBUPROFEN 600 MG PO TABS: 600.0000 mg | ORAL_TABLET | Freq: Four times a day (QID) | ORAL | Status: DC | PRN

## 2014-12-11 NOTE — Discharge Instructions (Signed)

## 2014-12-11 NOTE — MAU Note (Signed)
C/o R groin pain, that started last night and is rated @ 9; denies any injury but stated that she did lift a big heavy basket of clothes; UPT is neg;

## 2014-12-11 NOTE — MAU Note (Signed)
Yesterday started ;hurting in rt groin.  Continues today, hurting worse.

## 2014-12-11 NOTE — MAU Provider Note (Signed)
History     CSN: [id number]  Arrival date and time: 12/11/14 1701   First Provider Initiated Contact with Patient 12/11/14 1842      Chief Complaint  Patient presents with  . Groin Pain   HPI Tamara Church 39 y.o. [id number] nonpregnant female presents to MAU complaining of groin pain since yesterday.  She notes picking up something heavy and the pain started.  It has grown worse today.  It is intermittent, lasting 4-5 minutes and then relaxing for 10 minutes before hurting again.  It is sharp, throbbing.  Walking causes worsening and resting brings improvement.  Denies nausea, vomiting, fever, weakness, numbness, tingling.   OB History    Gravida Para Term Preterm AB TAB SAB Ectopic Multiple Living   0 0 0 0 0 0 6      Past Medical History  Diagnosis Date  . Anemia   . Hypertension     Past Surgical History  Procedure Laterality Date  . No past surgeries      Family History  Problem Relation Age of Onset  . Hypertension Mother     History  Substance Use Topics  . Smoking status: Never Smoker   . Smokeless tobacco: Never Used  . Alcohol Use: No    Allergies: No Known Allergies  Prescriptions prior to admission  Medication Sig Dispense Refill Last Dose  . acyclovir (ZOVIRAX) 400 MG tablet Take 1 tablet (400 mg total) by mouth 3 (three) times daily. (Patient not taking: Reported on 11/13/2014) 30 tablet 0 Not Taking at Unknown time  . aspirin EC 81 MG tablet Take 1 tablet (81 mg total) by mouth daily. (Patient not taking: Reported on 11/13/2014) 30 tablet 1 Not Taking at Unknown time  . diazepam (VALIUM) 5 MG tablet Take 1 tablet (5 mg total) by mouth every 12 (twelve) hours as needed for anxiety. (Patient not taking: Reported on 11/13/2014) 3 tablet 0 Not Taking at Unknown time  . diphenhydrAMINE (BENADRYL) 50 MG tablet Take 1 tablet (50 mg total) by mouth at bedtime as needed for itching. 30 tablet 0   . hydrochlorothiazide (HYDRODIURIL) 12.5 MG tablet Take 1  tablet (12.5 mg total) by mouth daily. 30 tablet 0   . ibuprofen (ADVIL,MOTRIN) 600 MG tablet Take 1 tablet (600 mg total) by mouth every 6 (six) hours as needed for moderate pain. (Patient not taking: Reported on 11/13/2014) 30 tablet 0 Not Taking at Unknown time  . levonorgestrel (MIRENA) 20 MCG/24HR IUD 1 each by Intrauterine route once.   2013  . lisinopril (PRINIVIL,ZESTRIL) 10 MG tablet Take 1 tablet (10 mg total) by mouth daily. 30 tablet 0   . meclizine (ANTIVERT) 25 MG tablet Take 1 tablet (25 mg total) by mouth 3 (three) times daily as needed for dizziness. 15 tablet 0   . methocarbamol (ROBAXIN) 500 MG tablet Take 1 tablet (500 mg total) by mouth 2 (two) times daily. (Patient not taking: Reported on 11/13/2014) 20 tablet 0 Not Taking at Unknown time  . metoCLOPramide (REGLAN) 10 MG tablet Take 1 tablet (10 mg total) by mouth every 6 (six) hours as needed for nausea. 15 tablet 0   . metoprolol tartrate (LOPRESSOR) 25 MG tablet Take 1 tablet (25 mg total) by mouth 2 (two) times daily. (Patient not taking: Reported on 11/13/2014) 30 tablet 0 Not Taking at Unknown time    ROS Pertinent ROS in HPI  Physical Exam   Blood pressure 180/123, pulse 85, temperature 98.3  F (36.8 C), temperature source Oral, resp. rate 20, height 5\' 1"  (1.549 m), weight 200 lb (90.719 kg).  Physical Exam  Constitutional: She is oriented to person, place, and time. She appears well-developed and well-nourished. No distress.  HENT:  Head: Normocephalic and atraumatic.  Eyes: EOM are normal.  Neck: Normal range of motion.  Cardiovascular: Normal rate and regular rhythm.   Respiratory: Effort normal and breath sounds normal. No respiratory distress.  GI: Soft. Bowel sounds are normal. She exhibits no distension. There is no tenderness. There is no rebound and no guarding.  Musculoskeletal: Normal range of motion.  Right inguinal region with tenderness to palpation.  Neurological: She is alert and oriented to  person, place, and time.  Skin: Skin is warm and dry.  Psychiatric: She has a normal mood and affect.    MAU Course  Procedures  MDM Blood pressures somewhat improved with large cuff.  Although they continue to be elevated.  Was not prescribed usual meds at last visit with PCP.  Likely muscle strain.  NO GYN problem.  Assessment and Plan  A:  1. Essential hypertension   2. Muscle strain     P: Discharge to home Increase HCTZ dosing.  Take 2 tabs which is dose of 25mg .  [facility] blood pressures/treatment and keep appt with them 3/28. Ibuprofen prn muscle pain Flexeril prn muscle pain.  Sedation precautions Encourage po fluids Patient may return to MAU as needed or if her condition were to change or worsen [facility] for non-gyn concerns    Bertram Denvereague Clark, Karen E 12/11/2014, 6:44 PM

## 2014-12-17 ENCOUNTER — Ambulatory Visit: Payer: Medicaid Other | Admitting: Family Medicine

## 2014-12-25 ENCOUNTER — Ambulatory Visit (INDEPENDENT_AMBULATORY_CARE_PROVIDER_SITE_OTHER): Payer: Medicaid Other | Admitting: Family Medicine

## 2014-12-25 ENCOUNTER — Encounter: Payer: Self-pay | Admitting: Family Medicine

## 2014-12-25 VITALS — BP 160/102 | HR 68 | Temp 98.3°F | Ht 61.0 in | Wt 210.0 lb

## 2014-12-25 DIAGNOSIS — I10 Essential (primary) hypertension: Secondary | ICD-10-CM

## 2014-12-25 MED ORDER — LISINOPRIL-HYDROCHLOROTHIAZIDE 20-25 MG PO TABS: 1.0000 | ORAL_TABLET | Freq: Every day | ORAL | Status: DC

## 2014-12-25 NOTE — Progress Notes (Signed)
Tamara Church is a 39 y.o. female who presents today for HTN.  HTN - Longstanding Hx of HTN with noncompliance in the past.  On lisinopril started in ED in March along with 25 mg HCTZ which she has been on for awhile.  Denies palpitations, edema, cough, CP/SOB, or HA.    Past Medical History  Diagnosis Date  . Anemia   . Hypertension     History  Smoking status  . Never Smoker   Smokeless tobacco  . Never Used    Family History  Problem Relation Age of Onset  . Hypertension Mother     Current Outpatient Prescriptions on File Prior to Visit  Medication Sig Dispense Refill  . cyclobenzaprine (FLEXERIL) 10 MG tablet Take 1 tablet (10 mg total) by mouth 3 (three) times daily as needed for muscle spasms. 30 tablet 0  . hydrochlorothiazide (HYDRODIURIL) 12.5 MG tablet Take 1 tablet (12.5 mg total) by mouth daily. 30 tablet 0  . ibuprofen (ADVIL,MOTRIN) 600 MG tablet Take 1 tablet (600 mg total) by mouth every 6 (six) hours as needed for moderate pain. 30 tablet 0  . levonorgestrel (MIRENA) 20 MCG/24HR IUD 1 each by Intrauterine route once.    Marland Kitchen. lisinopril (PRINIVIL,ZESTRIL) 10 MG tablet Take 1 tablet (10 mg total) by mouth daily. 30 tablet 0  . meclizine (ANTIVERT) 25 MG tablet Take 1 tablet (25 mg total) by mouth 3 (three) times daily as needed for dizziness. 15 tablet 0   No current facility-administered medications on file prior to visit.    ROS: Per HPI.  All other systems reviewed and are negative.   Physical Exam Filed Vitals:   12/25/14 1455  BP: 176/106  Pulse: 68  Temp: 98.3 F (36.8 C)   Repeat BP L arm - 160/102  Physical Examination: General appearance - alert, well appearing, and in no distress Chest - clear to auscultation, no wheezes, rales or rhonchi, symmetric air entry Heart - normal rate and regular rhythm, no murmurs noted, S4 present    Chemistry      Component Value Date/Time   NA 137 11/13/2014 1159   K 3.1* 11/13/2014 1159   CL 102  11/13/2014 1159   CO2 27 11/13/2014 1159   BUN 9 11/13/2014 1159   CREATININE 1.03 11/13/2014 1159   CREATININE 1.28* 03/18/2012 1013      Component Value Date/Time   CALCIUM 9.5 11/13/2014 1159   ALKPHOS 60 11/13/2014 1159   AST 21 11/13/2014 1159   ALT 16 11/13/2014 1159   BILITOT 0.5 11/13/2014 1159

## 2014-12-25 NOTE — Assessment & Plan Note (Signed)
Due to noncompliance  - Continue HCTZ 25 mg - Start Lisinopril 20 mg per day, f/u in 2-3 weeks for recheck, possible titration of lisinopril up to 40 mg and BMET recheck

## 2014-12-25 NOTE — Progress Notes (Signed)
I was preceptor for this office visit.  

## 2014-12-25 NOTE — Patient Instructions (Signed)
Please see us back in 2-3 weeks.  If any questions, please let us know.  Thanks, Dr. Paulina FusiHess

## 2015-06-03 ENCOUNTER — Emergency Department (HOSPITAL_COMMUNITY)
Admission: EM | Admit: 2015-06-03 | Discharge: 2015-06-03 | Disposition: A | Discharge status: Home / Self Care | Payer: Self-pay | Attending: Emergency Medicine | Admitting: Emergency Medicine

## 2015-06-03 ENCOUNTER — Encounter (HOSPITAL_COMMUNITY): Payer: Self-pay

## 2015-06-03 DIAGNOSIS — Z862 Personal history of diseases of the blood and blood-forming organs and certain disorders involving the immune mechanism: Secondary | ICD-10-CM | POA: Insufficient documentation

## 2015-06-03 DIAGNOSIS — K0889 Other specified disorders of teeth and supporting structures: Secondary | ICD-10-CM

## 2015-06-03 DIAGNOSIS — Z79899 Other long term (current) drug therapy: Secondary | ICD-10-CM | POA: Insufficient documentation

## 2015-06-03 DIAGNOSIS — K088 Other specified disorders of teeth and supporting structures: Secondary | ICD-10-CM | POA: Insufficient documentation

## 2015-06-03 DIAGNOSIS — I10 Essential (primary) hypertension: Secondary | ICD-10-CM | POA: Insufficient documentation

## 2015-06-03 MED ORDER — BUPIVACAINE-EPINEPHRINE (PF) 0.5% -1:200000 IJ SOLN
1.8000 mL | Freq: Once | INTRAMUSCULAR | Status: AC
  Administered 2015-06-03: 1.8 mL
  Filled 2015-06-03: qty 1.8

## 2015-06-03 NOTE — ED Notes (Signed)
PA at bedside.

## 2015-06-03 NOTE — ED Provider Notes (Signed)
CSN: [id number]     Arrival date & time 06/03/15  1037 History   First MD Initiated Contact with Patient 06/03/15 1130     Chief Complaint  Patient presents with  . Dental Pain   HPI  39 year old female presents today with dental pain. Patient reports a significant past medical history of dental cavities, fractured teeth, dental pain. She reports that over the last several days she's had left lower dental pain, worse with cold fluids. She reports this pain radiates into her left ear. She notes that she contacted her dentist but could not be seen as she does not have insurance. Patient has used ibuprofen, reporting it provides some symptomatic relief but this is very brief. Patient denies fever, chills, nausea, vomiting,swelling of the face or jaw, difficulty opening her mouth,or any signs of acute infection.  Past Medical History  Diagnosis Date  . Anemia   . Hypertension    Past Surgical History  Procedure Laterality Date  . No past surgeries     Family History  Problem Relation Age of Onset  . Hypertension Mother    Social History  Substance Use Topics  . Smoking status: Never Smoker   . Smokeless tobacco: Never Used  . Alcohol Use: No   OB History    Gravida Para Term Preterm AB TAB SAB Ectopic Multiple Living   0 0 0 0 0 0 6     Review of Systems  All other systems reviewed and are negative.   Allergies  Review of patient's allergies indicates no known allergies.  Home Medications   Prior to Admission medications   Medication Sig Start Date End Date Taking? Authorizing Provider  levonorgestrel (MIRENA) 20 MCG/24HR IUD 1 each by Intrauterine route once.    Historical Provider, MD  lisinopril-hydrochlorothiazide (PRINZIDE,ZESTORETIC) 20-25 MG per tablet Take 1 tablet by mouth daily. 12/25/14   Bryan R Hess, DO   BP 189/118 mmHg  Pulse 72  Temp(Src) 98.6 F (37 C) (Oral)  Resp 20  SpO2 99%  LMP 05/20/2015   Physical Exam  Constitutional: She is oriented  to person, place, and time. She appears well-developed and well-nourished. No distress.  HENT:  Head: Normocephalic and atraumatic.  Mouth/Throat: Uvula is midline, oropharynx is clear and moist and mucous membranes are normal. No oropharyngeal exudate, posterior oropharyngeal edema, posterior oropharyngeal erythema or tonsillar abscesses.  External exam shows no asymmetry of the jaw line or face, no signs of obvious swelling, edema, infection. Full active range of motion of the jaw. Neck is supple with full active range of motion, no tenderness to palpation of the soft tissues  Gumline palpated no obvious signs of infection including warmth, redness, abscess, tenderness. Posterior oropharynx clear with no signs of infection, uvula is midline and rises with phonation, tonsils present and normal in size, symmetrical bilateral, tongue is normal soft touch with full active range of motion, floor mouth is soft nontender.  Eyes: Conjunctivae are normal. Pupils are equal, round, and reactive to light. Right eye exhibits no discharge. Left eye exhibits no discharge. No scleral icterus.  Neck: Normal range of motion. Neck supple. No JVD present. No tracheal deviation present. No thyromegaly present.  Pulmonary/Chest: Effort normal. No stridor.  Lymphadenopathy:    She has no cervical adenopathy.  Neurological: She is alert and oriented to person, place, and time. Coordination normal.  Skin: Skin is warm and dry. No rash noted. She is not diaphoretic. No erythema. No pallor.  Psychiatric: She  has a normal mood and affect. Her behavior is normal. Judgment and thought content normal.  Nursing note and vitals reviewed.     ED Course  Procedures (including critical care time) Labs Review Labs Reviewed - No data to display  Imaging Review No results found. I have personally reviewed and evaluated these images and lab results as part of my medical decision-making.   EKG Interpretation None      MDM    Final diagnoses:  Pain, dental    Labs:    Imaging:  Consults:   Therapeutics:  Discharge Meds:   Assessment/Plan: Patient presents with uncomplicated dental pain, encouraged follow-up with dentist further evaluation and management. No signs of infection. Ibuprofen or Tylenol as needed for pain.         Eyvonne Mechanic, PA-C 06/04/15 1949  Raeford Razor, MD 06/13/15 [phone]

## 2015-06-03 NOTE — ED Notes (Signed)
PA aware of pt's blood pressure  Pt has not taken her blood pressure medication this morning and states she will take it when she gets home

## 2015-06-03 NOTE — Discharge Instructions (Signed)
Dental Pain A tooth ache may be caused by cavities (tooth decay). Cavities expose the nerve of the tooth to air and hot or cold temperatures. It may come from an infection or abscess (also called a boil or furuncle) around your tooth. It is also often caused by dental caries (tooth decay). This causes the pain you are having. DIAGNOSIS  Your caregiver can diagnose this problem by exam. TREATMENT   If caused by an infection, it may be treated with medications which kill germs (antibiotics) and pain medications as prescribed by your caregiver. Take medications as directed.  Only take over-the-counter or prescription medicines for pain, discomfort, or fever as directed by your caregiver.  Whether the tooth ache today is caused by infection or dental disease, you should see your dentist as soon as possible for further care. SEEK MEDICAL CARE IF: The exam and treatment you received today has been provided on an emergency basis only. This is not a substitute for complete medical or dental care. If your problem worsens or new problems (symptoms) appear, and you are unable to meet with your dentist, call or return to this location. SEEK IMMEDIATE MEDICAL CARE IF:   You have a fever.  You develop redness and swelling of your face, jaw, or neck.  You are unable to open your mouth.  You have severe pain uncontrolled by pain medicine. MAKE SURE YOU:   Understand these instructions.  Will watch your condition.  Will get help right away if you are not doing well or get worse. Document Released: 09/07/2005 Document Revised: 11/30/2011 Document Reviewed: 04/25/2008 [facility] Patient Information 2015 [place], [place]. This information is not intended to replace advice given to you by your health care provider. Make sure you discuss any questions you have with your health care provider.  Please use tylenol and ibuprofen as needed for pain. Please follow up with dentist for further evaluation and  management.

## 2015-06-03 NOTE — ED Notes (Signed)
Dental pain since Wednesday.  Obvious cavity

## 2015-06-17 ENCOUNTER — Ambulatory Visit (INDEPENDENT_AMBULATORY_CARE_PROVIDER_SITE_OTHER): Payer: Self-pay | Admitting: Family Medicine

## 2015-06-17 ENCOUNTER — Telehealth: Payer: Self-pay | Admitting: Family Medicine

## 2015-06-17 VITALS — BP 170/120 | HR 76 | Temp 98.3°F | Wt 209.7 lb

## 2015-06-17 DIAGNOSIS — IMO0001 Reserved for inherently not codable concepts without codable children: Secondary | ICD-10-CM

## 2015-06-17 DIAGNOSIS — I10 Essential (primary) hypertension: Secondary | ICD-10-CM

## 2015-06-17 DIAGNOSIS — R030 Elevated blood-pressure reading, without diagnosis of hypertension: Secondary | ICD-10-CM

## 2015-06-17 MED ORDER — LOSARTAN POTASSIUM-HCTZ 100-25 MG PO TABS: 1.0000 | ORAL_TABLET | Freq: Every day | ORAL | Status: DC

## 2015-06-17 NOTE — Telephone Encounter (Signed)
Needs generic of BP medicine prescibed today. Cannot afford it ---$73 Please advise

## 2015-06-17 NOTE — Assessment & Plan Note (Addendum)
BP elevated on manual recheck No evidence of hypertensive urgency or emergency at this time Discontinue lisinopril given that cough is likely related to medication side effect Start losartan-HCTZ 100-25 milligrams daily Follow-up in about 1 week for RN BP check Consider additional antihypertensives if continues to be elevated at that point Return precautions given

## 2015-06-17 NOTE — Progress Notes (Signed)
   Subjective:   Tamara Church is a 39 y.o. female with a history of HTN, GERD, CKD 2, obesity here for same day appt for elevated BP.  Patient reports that she went to the dentist twice last week to have teeth pulled. The dentist started her blood pressure was in the 170s/120s perform the procedure at that time. Patient reports she didn't feel nervous about being at the dentist. She hasn't had any major stressors in her life recently. She does not check her blood pressure at home. She takes lisinopril-HCTZ 20-25 daily and reports she hasn't missed any doses. She reports she has had dry cough for the last month.  She denies any CP, SOB, vision changes, LE edema. She reports she is having occasional headaches. She sometimes wakes up with one in the morning and thinks this happens when her blood pressures elevated.  Review of Systems: Per HPI. Social History: never smoker  Objective:  BP 170/120 mmHg  Pulse 76  Temp(Src) 98.3 F (36.8 C) (Oral)  Wt 209 lb 11.2 oz (95.119 kg)  LMP 05/20/2015  Gen:  39 y.o. female in NAD HEENT: NCAT, MMM, EOMI, PERRL, anicteric sclerae CV: RRR, no MRG Resp: Non-labored, CTAB, no wheezes noted Ext: WWP, no edema Neuro: Alert and oriented, speech normal, strength and sensation intact.      Chemistry      Component Value Date/Time   NA 137 11/13/2014 1159   K 3.1* 11/13/2014 1159   CL 102 11/13/2014 1159   CO2 27 11/13/2014 1159   BUN 9 11/13/2014 1159   CREATININE 1.03 11/13/2014 1159   CREATININE 1.28* 03/18/2012 1013      Component Value Date/Time   CALCIUM 9.5 11/13/2014 1159   ALKPHOS 60 11/13/2014 1159   AST 21 11/13/2014 1159   ALT 16 11/13/2014 1159   BILITOT 0.5 11/13/2014 1159      Lab Results  Component Value Date   WBC 4.7 11/13/2014   HGB 12.4 11/13/2014   HCT 37.5 11/13/2014   MCV 86.0 11/13/2014   PLT 244 11/13/2014   Assessment & Plan:     Tamara Church is a 39 y.o. female here for elevated blood  pressure.  HYPERTENSION, BENIGN SYSTEMIC BP elevated on manual recheck No evidence of hypertensive urgency or emergency at this time Discontinue lisinopril given that cough is likely related to medication side effect Start losartan-HCTZ 100-25 milligrams daily Follow-up in about 1 week for RN BP check Consider additional antihypertensives if continues to be elevated at that point Return precautions given   Erasmo Downer, MD MPH PGY-2,  [facility] 06/17/2015  10:18 AM

## 2015-06-17 NOTE — Patient Instructions (Signed)
Nice to meet you today. Stop taking her lisinopril HCTZ. Start taking losartan HCTZ once daily. Please come back in one week for nurse blood pressure check. If you experience any vision changes, chest pain, shortness of breath, weakness, speech difficulties, please call us or go to the emergency room.  Take care, Dr. B  Managing Your High Blood Pressure Blood pressure is a measurement of how forceful your blood is pressing against the walls of the arteries. Arteries are muscular tubes within the circulatory system. Blood pressure does not stay the same. Blood pressure rises when you are active, excited, or nervous; and it lowers during sleep and relaxation. If the numbers measuring your blood pressure stay above normal most of the time, you are at risk for health problems. High blood pressure (hypertension) is a long-term (chronic) condition in which blood pressure is elevated. A blood pressure reading is recorded as two numbers, such as 120 over 80 (or 120/80). The first, higher number is called the systolic pressure. It is a measure of the pressure in your arteries as the heart beats. The second, lower number is called the diastolic pressure. It is a measure of the pressure in your arteries as the heart relaxes between beats.  Keeping your blood pressure in a normal range is important to your overall health and prevention of health problems, such as heart disease and stroke. When your blood pressure is uncontrolled, your heart has to work harder than normal. High blood pressure is a very common condition in adults because blood pressure tends to rise with age. Men and women are equally likely to have hypertension but at different times in life. Before age 76, men are more likely to have hypertension. After 38 years of age, women are more likely to have it. Hypertension is especially common in African Americans. This condition often has no signs or symptoms. The cause of the condition is usually not known.  Your caregiver can help you come up with a plan to keep your blood pressure in a normal, healthy range. BLOOD PRESSURE STAGES Blood pressure is classified into four stages: normal, prehypertension, stage 1, and stage 2. Your blood pressure reading will be used to determine what type of treatment, if any, is necessary. Appropriate treatment options are tied to these four stages:  Normal  Systolic pressure (mm Hg): below 120.  Diastolic pressure (mm Hg): below 80. Prehypertension  Systolic pressure (mm Hg): 120 to 139.  Diastolic pressure (mm Hg): 80 to 89. Stage1  Systolic pressure (mm Hg): 140 to 159.  Diastolic pressure (mm Hg): 90 to 99. Stage2  Systolic pressure (mm Hg): 160 or above.  Diastolic pressure (mm Hg): 100 or above. RISKS RELATED TO HIGH BLOOD PRESSURE Managing your blood pressure is an important responsibility. Uncontrolled high blood pressure can lead to:  A heart attack.  A stroke.  A weakened blood vessel (aneurysm).  Heart failure.  Kidney damage.  Eye damage.  Metabolic syndrome.  Memory and concentration problems. HOW TO MANAGE YOUR BLOOD PRESSURE Blood pressure can be managed effectively with lifestyle changes and medicines (if needed). Your caregiver will help you come up with a plan to bring your blood pressure within a normal range. Your plan should include the following: Education  Read all information provided by your caregivers about how to control blood pressure.  Educate yourself on the latest guidelines and treatment recommendations. New research is always being done to further define the risks and treatments for high blood pressure. Lifestylechanges  Control your weight.  Avoid smoking.  Stay physically active.  Reduce the amount of salt in your diet.  Reduce stress.  Control any chronic conditions, such as high cholesterol or diabetes.  Reduce your alcohol intake. Medicines  Several medicines (antihypertensive  medicines) are available, if needed, to bring blood pressure within a normal range. Communication  Review all the medicines you take with your caregiver because there may be side effects or interactions.  Talk with your caregiver about your diet, exercise habits, and other lifestyle factors that may be contributing to high blood pressure.  See your caregiver regularly. Your caregiver can help you create and adjust your plan for managing high blood pressure. RECOMMENDATIONS FOR TREATMENT AND FOLLOW-UP  The following recommendations are based on current guidelines for managing high blood pressure in nonpregnant adults. Use these recommendations to identify the proper follow-up period or treatment option based on your blood pressure reading. You can discuss these options with your caregiver.  Systolic pressure of 120 to 139 or diastolic pressure of 80 to 89: Follow up with your caregiver as directed.  Systolic pressure of 140 to 160 or diastolic pressure of 90 to 100: Follow up with your caregiver within 2 months.  Systolic pressure above 160 or diastolic pressure above 100: Follow up with your caregiver within 1 month.  Systolic pressure above 180 or diastolic pressure above 110: Consider antihypertensive therapy; follow up with your caregiver within 1 week.  Systolic pressure above 200 or diastolic pressure above 120: Begin antihypertensive therapy; follow up with your caregiver within 1 week. Document Released: 06/01/2012 Document Reviewed: 06/01/2012 [facility] Patient Information 2015 [place], [place]. This information is not intended to replace advice given to you by your health care provider. Make sure you discuss any questions you have with your health care provider.

## 2015-06-17 NOTE — Telephone Encounter (Signed)
Please call in another bp rx due to the Losartan hctz being too expensive to get.

## 2015-06-17 NOTE — Telephone Encounter (Signed)
Will forward to Dr. Beryle Flock.

## 2015-06-18 MED ORDER — LOSARTAN POTASSIUM 100 MG PO TABS: 100.0000 mg | ORAL_TABLET | Freq: Every day | ORAL | Status: DC

## 2015-06-18 MED ORDER — HYDROCHLOROTHIAZIDE 25 MG PO TABS: 25.0000 mg | ORAL_TABLET | Freq: Every day | ORAL | Status: DC

## 2015-06-18 NOTE — Telephone Encounter (Signed)
Patient informed, expressed understanding. 

## 2015-06-18 NOTE — Telephone Encounter (Signed)
Pt called back to see when the doctor is going to send in a cheaper BP medication. She said she needs this ASAP. Tamara Church

## 2015-06-18 NOTE — Telephone Encounter (Signed)
Separate prescriptions for losartan and HCTZ sent to pharmacy.  Should be more affordable not in combination.  Erasmo Downer, MD, MPH PGY-2,  [facility] 06/18/2015 3:20 PM

## 2015-06-18 NOTE — Telephone Encounter (Signed)
Pt calling once more about this. She states that she is in fear of having a stroke if something isn't done soon. Tamara Church, ASA

## 2015-06-28 ENCOUNTER — Emergency Department (HOSPITAL_COMMUNITY): Payer: Self-pay

## 2015-06-28 ENCOUNTER — Encounter (HOSPITAL_COMMUNITY): Payer: Self-pay | Admitting: Nurse Practitioner

## 2015-06-28 ENCOUNTER — Emergency Department (HOSPITAL_COMMUNITY)
Admission: EM | Admit: 2015-06-28 | Discharge: 2015-06-28 | Disposition: A | Discharge status: Home / Self Care | Payer: Self-pay | Attending: Emergency Medicine | Admitting: Emergency Medicine

## 2015-06-28 DIAGNOSIS — B9789 Other viral agents as the cause of diseases classified elsewhere: Secondary | ICD-10-CM

## 2015-06-28 DIAGNOSIS — Z79899 Other long term (current) drug therapy: Secondary | ICD-10-CM | POA: Insufficient documentation

## 2015-06-28 DIAGNOSIS — Z862 Personal history of diseases of the blood and blood-forming organs and certain disorders involving the immune mechanism: Secondary | ICD-10-CM | POA: Insufficient documentation

## 2015-06-28 DIAGNOSIS — J069 Acute upper respiratory infection, unspecified: Secondary | ICD-10-CM | POA: Insufficient documentation

## 2015-06-28 DIAGNOSIS — I10 Essential (primary) hypertension: Secondary | ICD-10-CM | POA: Insufficient documentation

## 2015-06-28 MED ORDER — BENZONATATE 100 MG PO CAPS: 100.0000 mg | ORAL_CAPSULE | Freq: Three times a day (TID) | ORAL | Status: DC

## 2015-06-28 NOTE — Discharge Instructions (Signed)

## 2015-06-28 NOTE — ED Notes (Signed)
C/o several week history of "coughing so hard I start gagging" and headache. She went to her PCP and they were thinking about taking her off lisinopril to see if that relieved the cough but she never received further instructions about this. Denies fevers, cp, sob, bowel/bladder changes. A&Ox4, resp e/u

## 2015-06-28 NOTE — ED Notes (Signed)
Pt A&OX4, ambulatory at d/c with steady gait, NAD, states she has all of her belongings with her at d/c. 

## 2015-06-28 NOTE — ED Provider Notes (Signed)
CSN: [id number]     Arrival date & time 06/28/15  1502 History   First MD Initiated Contact with Patient 06/28/15 1549     Chief Complaint  Patient presents with  . Cough     (Consider location/radiation/quality/duration/timing/severity/associated sxs/prior Treatment) Patient is a 39 y.o. female presenting with cough. The history is provided by the patient. No language interpreter was used.  Cough Cough characteristics:  Paroxysmal and vomit-inducing Severity:  Moderate Onset quality:  Gradual Duration:  3 weeks Timing:  Intermittent Progression:  Worsening Chronicity:  New Smoker: no   Associated symptoms: chills     Past Medical History  Diagnosis Date  . Anemia   . Hypertension    Past Surgical History  Procedure Laterality Date  . No past surgeries     Family History  Problem Relation Age of Onset  . Hypertension Mother    Social History  Substance Use Topics  . Smoking status: Never Smoker   . Smokeless tobacco: Never Used  . Alcohol Use: No   OB History    Gravida Para Term Preterm AB TAB SAB Ectopic Multiple Living   0 0 0 0 0 0 6     Review of Systems  Constitutional: Positive for chills.  Respiratory: Positive for cough.   All other systems reviewed and are negative.     Allergies  Ace inhibitors  Home Medications   Prior to Admission medications   Medication Sig Start Date End Date Taking? Authorizing Provider  hydrochlorothiazide (HYDRODIURIL) 25 MG tablet Take 1 tablet (25 mg total) by mouth daily. 06/18/15   Erasmo Downer, MD  levonorgestrel (MIRENA) 20 MCG/24HR IUD 1 each by Intrauterine route once.    Historical Provider, MD  losartan (COZAAR) 100 MG tablet Take 1 tablet (100 mg total) by mouth daily. 06/18/15   Erasmo Downer, MD   BP 195/109 mmHg  Pulse 92  Temp(Src) 98.4 F (36.9 C) (Oral)  Resp 18  Ht  (1.575 m)  Wt 209 lb (94.802 kg)  BMI 38.22 kg/m2  SpO2 100%  LMP 05/20/2015 Physical Exam   Constitutional: She is oriented to person, place, and time. She appears well-developed and well-nourished.  HENT:  Head: Normocephalic.  Eyes: Pupils are equal, round, and reactive to light.  Neck: Normal range of motion. Neck supple.  Cardiovascular: Normal rate and regular rhythm.   Pulmonary/Chest: Effort normal. She exhibits tenderness.  Abdominal: Soft. Bowel sounds are normal. She exhibits no distension. There is no tenderness.  Musculoskeletal: She exhibits no edema or tenderness.  Lymphadenopathy:    She has no cervical adenopathy.  Neurological: She is alert and oriented to person, place, and time.  Skin: Skin is warm and dry.  Psychiatric: She has a normal mood and affect.  Nursing note and vitals reviewed.   ED Course  Procedures (including critical care time) Labs Review Labs Reviewed - No data to display  Imaging Review Dg Chest 2 View  06/28/2015   CLINICAL DATA:  Cough for 2 weeks.  EXAM: CHEST  2 VIEW  COMPARISON:  None.  FINDINGS: Lungs are clear without airspace disease or pulmonary edema. Heart and mediastinum are within normal limits. Trachea is midline. 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 No acute bone abnormality.  IMPRESSION: No active cardiopulmonary disease.   Electronically Signed   By: Richarda Overlie M.D.   On: 06/28/2015 16:13   I have personally reviewed and evaluated these images and lab results as part  of my medical decision-making.   EKG Interpretation None     Radiology results reviewed and shared with patient. No acute findings.  Patient has prescription for new anti-hypertensive ready at her pharmacy. MDM   Final diagnoses:  None    Viral URI with cough. Return precautions discussed.    Felicie Morn, NP 06/29/15 0136  Tilden Fossa, MD 06/29/15 1332

## 2015-07-31 ENCOUNTER — Telehealth: Payer: Self-pay | Admitting: Family Medicine

## 2015-07-31 ENCOUNTER — Ambulatory Visit (INDEPENDENT_AMBULATORY_CARE_PROVIDER_SITE_OTHER): Payer: Self-pay | Admitting: Family Medicine

## 2015-07-31 VITALS — BP 160/100 | HR 103 | Temp 98.7°F | Ht 62.0 in | Wt 213.5 lb

## 2015-07-31 DIAGNOSIS — R059 Cough, unspecified: Secondary | ICD-10-CM

## 2015-07-31 DIAGNOSIS — R05 Cough: Secondary | ICD-10-CM

## 2015-07-31 MED ORDER — OMEPRAZOLE 20 MG PO CPDR: 20.0000 mg | DELAYED_RELEASE_CAPSULE | Freq: Every day | ORAL | Status: DC

## 2015-07-31 MED ORDER — HYDROCHLOROTHIAZIDE 25 MG PO TABS: 25.0000 mg | ORAL_TABLET | Freq: Every day | ORAL | Status: DC

## 2015-07-31 MED ORDER — DEXTROMETHORPHAN-GUAIFENESIN 10-200 MG/5ML PO LIQD: 5.0000 mL | ORAL | Status: DC | PRN

## 2015-07-31 MED ORDER — AMLODIPINE BESYLATE 5 MG PO TABS: 5.0000 mg | ORAL_TABLET | Freq: Every day | ORAL | Status: DC

## 2015-07-31 NOTE — Patient Instructions (Addendum)
Thank you for coming to see me today. It was a pleasure. Today we talked about:   Cough: This may be related to your new blood pressure medication but more likely related to another cause. I will treat you for possible post-nasal drip, possible reflux and change your blood pressure medication. If your symptoms improve, we can gradually make changes to see if something you were receiving improved your symptoms. I am also giving you a cough syrup to help with your coughing.  Please make an appointment to see Dr. Jarvis NewcomerGrunz for follow-up in 3-4 weeks.  If you have any questions or concerns, please do not hesitate to call the office at [phone].  Sincerely,  Jacquelin Hawkingalph Kyesha Balla, MD  Amlodipine tablets What is this medicine? AMLODIPINE (am LOE di peen) is a calcium-channel blocker. It affects the amount of calcium found in your heart and muscle cells. This relaxes your blood vessels, which can reduce the amount of work the heart has to do. This medicine is used to lower high blood pressure. It is also used to prevent chest pain. This medicine may be used for other purposes; ask your health care provider or pharmacist if you have questions. What should I tell my health care provider before I take this medicine? They need to know if you have any of these conditions: -heart problems like heart failure or aortic stenosis -liver disease -an unusual or allergic reaction to amlodipine, other medicines, foods, dyes, or preservatives -pregnant or trying to get pregnant -breast-feeding How should I use this medicine? Take this medicine by mouth with a glass of water. Follow the directions on the prescription label. Take your medicine at regular intervals. Do not take more medicine than directed. Talk to your pediatrician regarding the use of this medicine in children. Special care may be needed. This medicine has been used in children as young as 6. Persons over 38 years old may have a stronger reaction to this  medicine and need smaller doses. Overdosage: If you think you have taken too much of this medicine contact a poison control center or emergency room at once. NOTE: This medicine is only for you. Do not share this medicine with others. What if I miss a dose? If you miss a dose, take it as soon as you can. If it is almost time for your next dose, take only that dose. Do not take double or extra doses. What may interact with this medicine? -herbal or dietary supplements -local or general anesthetics -medicines for high blood pressure -medicines for prostate problems -rifampin This list may not describe all possible interactions. Give your health care provider a list of all the medicines, herbs, non-prescription drugs, or dietary supplements you use. Also tell them if you smoke, drink alcohol, or use illegal drugs. Some items may interact with your medicine. What should I watch for while using this medicine? Visit your doctor or health care professional for regular check ups. Check your blood pressure and pulse rate regularly. Ask your health care professional what your blood pressure and pulse rate should be, and when you should contact him or her. This medicine may make you feel confused, dizzy or lightheaded. Do not drive, use machinery, or do anything that needs mental alertness until you know how this medicine affects you. To reduce the risk of dizzy or fainting spells, do not sit or stand up quickly, especially if you are an older patient. Avoid alcoholic drinks; they can make you more dizzy. Do not suddenly  stop taking amlodipine. Ask your doctor or health care professional how you can gradually reduce the dose. What side effects may I notice from receiving this medicine? Side effects that you should report to your doctor or health care professional as soon as possible: -allergic reactions like skin rash, itching or hives, swelling of the face, lips, or tongue -breathing problems -changes in  vision or hearing -chest pain -fast, irregular heartbeat -swelling of legs or ankles Side effects that usually do not require medical attention (report to your doctor or health care professional if they continue or are bothersome): -dry mouth -facial flushing -nausea, vomiting -stomach gas, pain -tired, weak -trouble sleeping This list may not describe all possible side effects. Call your doctor for medical advice about side effects. You may report side effects to FDA at 1-800-FDA-1088. Where should I keep my medicine? Keep out of the reach of children. Store at room temperature between 59 and 86 degrees F (15 and 30 degrees C). Protect from light. Keep container tightly closed. Throw away any unused medicine after the expiration date. NOTE: This sheet is a summary. It may not cover all possible information. If you have questions about this medicine, talk to your doctor, pharmacist, or health care provider.    2016, Elsevier/Gold Standard. (2012-08-05 11:40:58)

## 2015-07-31 NOTE — Telephone Encounter (Signed)
Pt is concerned that HCTZ is for her blood pressure. She doesn't understand why she was given 2 BP pills  Please advise

## 2015-07-31 NOTE — Progress Notes (Addendum)
    Subjective   Gregary Cromerameka L Rahrig is a 39 y.o. female that presents for a same day visit  1. Cough: Symptoms started 3 weeks ago with a cough. Her cough is non-productive. She has some upper abdominal pain because of her coughing. She has some associated rhinorrhea and nasal congestion. No fever, sneezing, nausea or vomiting. She reports some shortness of breath. She has been adherent with her losartan/hctz but did not take her medication yet today. Symptoms are getting worse.  ROS Per HPI  Social History  Substance Use Topics  . Smoking status: Never Smoker   . Smokeless tobacco: Never Used  . Alcohol Use: No    Allergies  Allergen Reactions  . Ace Inhibitors Cough    Objective   BP 160/100 mmHg  Pulse 103  Temp(Src) 98.7 F (37.1 C) (Oral)  Ht 5\' 2"  (1.575 m)  Wt 213 lb 8 oz (96.843 kg)  BMI 39.04 kg/m2  General: Well appearing HEENT: Pupils equal and reactive to light/accomodation. Extraocular movements intact bilaterally. Tympanic membranes normal bilaterally. Nares patent bilaterally with slightly edematous mucosa. Oropharnx clear and moist. No cervical adenopathy bilaterally Respiratory/Chest: Clear to auscultation bilaterally, no wheezing  Assessment and Plan   Meds ordered this encounter  Medications  . amLODipine (NORVASC) 5 MG tablet    Sig: Take 1 tablet (5 mg total) by mouth daily.    Dispense:  30 tablet    Refill:  1  . hydrochlorothiazide (HYDRODIURIL) 25 MG tablet    Sig: Take 1 tablet (25 mg total) by mouth daily.    Dispense:  30 tablet    Refill:  1  . omeprazole (PRILOSEC) 20 MG capsule    Sig: Take 1 capsule (20 mg total) by mouth daily.    Dispense:  30 capsule    Refill:  1  . Dextromethorphan-Guaifenesin 10-200 MG/5ML LIQD    Sig: Take 5-10 mLs by mouth every 4 (four) hours as needed (Cough).    Dispense:  236 mL    Refill:  0    Cough: unsure of etiology. Patient wanting everything changed at this point. Possibly related to reaction  with ARB since patient had a previous cough reaction to ACEi. Possibly URI, GERD or post-nasal drip related. Low suspicion for pneumonia. - discontinue losartan - refill HCTZ - Start amlodipine 5mg  daily - start prilosec 20mg  daily - start dextromethorphan-guaifenesin - Recommend, if symptoms improve, removing treatments until cause can be found - return precautions

## 2015-08-02 NOTE — Telephone Encounter (Signed)
I discussed with the patient during her visit that since she wanted to come off the ARB, that I would start a new BP medication. This does not come as a combination pill like her last medication. I did discuss this with her, but maybe I was not clear enough. If she has concerns or if she would like to go back to her previous regimen, that will be fine but she will need to let us know. Please inform.

## 2015-08-02 NOTE — Telephone Encounter (Signed)
I'm not sure what this is relating to. I've never seen this pt before. I will forward to Dr. Caleb PoppNettey who prescribed this.

## 2015-08-05 NOTE — Telephone Encounter (Signed)
Called LVM for pt to call the office.

## 2015-08-05 NOTE — Telephone Encounter (Signed)
Pt called back, something happened and the line went silent.  I tried to call her back and LM on voicemail. Sunday SpillersSharon T Luciann Gossett, CMA

## 2015-08-05 NOTE — Telephone Encounter (Signed)
Spoke to pt. Informed her of the information below. Pt understood and said she would keep the meds as they are.  She isn't coughing and seems to be a good thing. Sunday SpillersSharon T Saunders, CMA

## 2015-09-03 ENCOUNTER — Emergency Department (HOSPITAL_COMMUNITY)
Admission: EM | Admit: 2015-09-03 | Discharge: 2015-09-03 | Disposition: A | Discharge status: Home / Self Care | Payer: Self-pay | Attending: Emergency Medicine | Admitting: Emergency Medicine

## 2015-09-03 ENCOUNTER — Encounter (HOSPITAL_COMMUNITY): Payer: Self-pay | Admitting: Emergency Medicine

## 2015-09-03 DIAGNOSIS — Z79899 Other long term (current) drug therapy: Secondary | ICD-10-CM | POA: Insufficient documentation

## 2015-09-03 DIAGNOSIS — Z862 Personal history of diseases of the blood and blood-forming organs and certain disorders involving the immune mechanism: Secondary | ICD-10-CM | POA: Insufficient documentation

## 2015-09-03 DIAGNOSIS — I10 Essential (primary) hypertension: Secondary | ICD-10-CM | POA: Insufficient documentation

## 2015-09-03 DIAGNOSIS — J069 Acute upper respiratory infection, unspecified: Secondary | ICD-10-CM | POA: Insufficient documentation

## 2015-09-03 NOTE — ED Notes (Signed)
Patient states started having cough, and sore throat yesterday.  Denies other symptoms.   Patient has chronic high pressure problems, she hasn't had bp meds since Sunday.  Patient states she has called in a prescription refill and will pick up today.

## 2015-09-03 NOTE — ED Provider Notes (Signed)
CSN: [id number]     Arrival date & time 09/03/15  1024 History  This chart was scribed for non-physician practitioner, Ailene RudStevie Orley Lawry, PA-C, working with Tilden FossaElizabeth Rees, MD by Marica OtterNusrat Rahman, ED Scribe. This patient was seen in room TR01C/TR01C and the patient's care was started at 12:10 PM.  Chief Complaint  Patient presents with  . Sore Throat  . Cough   The history is provided by the patient. No language interpreter was used.   PCP: Hazeline Junkeryan Grunz, MD HPI Comments: Tamara Church is a 39 y.o. female, with PMHx noted below including HTN (chronic elevated BP -BP during exam 185/122) who presents to the Emergency Department complaining of sore throat with associated cough with clear phlegm, mild congestion and mild rhinorrhea onset yesterday. She denies productive sputum with her cough but states that occasionally she coughs so hard that she "gags up clear mucus". She denies purulent nasal discharge. Her sore throat is bilateral and the pain increases with swallowing. She denies difficulty handling secretions or breathing. Pt reports using chloraseptic pills at home with no relief. She has not used OTC decongestants or other symptom relievers. Pt further denies fever, chills, headache, dizziness, blurred vision, chest pain, SOB, abd pain, n/v/d. Pt denies any sick contacts or recent travel. She has not taken her BP medications in 3 days. She reports that her BP runs in the 190s/110s and she is attempting to get it under control with her PCP. She reports a medication change 3 weeks ago.   Past Medical History  Diagnosis Date  . Anemia   . Hypertension    Past Surgical History  Procedure Laterality Date  . No past surgeries     Family History  Problem Relation Age of Onset  . Hypertension Mother    Social History  Substance Use Topics  . Smoking status: Never Smoker   . Smokeless tobacco: Never Used  . Alcohol Use: No   OB History    Gravida Para Term Preterm AB TAB SAB Ectopic Multiple  Living   6 6 6  0 0 0 0 0 0 6     Review of Systems  Constitutional: Negative for fever, chills and fatigue.  HENT: Positive for congestion, postnasal drip, rhinorrhea and sore throat. Negative for drooling, sinus pressure and trouble swallowing.   Eyes: Negative for discharge and redness.  Respiratory: Positive for cough. Negative for shortness of breath.   Cardiovascular: Negative for chest pain and palpitations.  Gastrointestinal: Negative for nausea, vomiting, abdominal pain and diarrhea.  Musculoskeletal: Negative for myalgias.  Neurological: Negative for dizziness, syncope, weakness, light-headedness and headaches.  All other systems reviewed and are negative.  Allergies  Ace inhibitors  Home Medications   Prior to Admission medications   Medication Sig Start Date End Date Taking? Authorizing Provider  amLODipine (NORVASC) 5 MG tablet Take 1 tablet (5 mg total) by mouth daily. 07/31/15   Narda Bondsalph A Nettey, MD  benzonatate (TESSALON) 100 MG capsule Take 1 capsule (100 mg total) by mouth every 8 (eight) hours. 06/28/15   Felicie Mornavid Smith, NP  Dextromethorphan-Guaifenesin 10-200 MG/5ML LIQD Take 5-10 mLs by mouth every 4 (four) hours as needed (Cough). 07/31/15   Narda Bondsalph A Nettey, MD  hydrochlorothiazide (HYDRODIURIL) 25 MG tablet Take 1 tablet (25 mg total) by mouth daily. 07/31/15   Narda Bondsalph A Nettey, MD  levonorgestrel (MIRENA) 20 MCG/24HR IUD 1 each by Intrauterine route once.    Historical Provider, MD  omeprazole (PRILOSEC) 20 MG capsule Take 1 capsule (20 mg  total) by mouth daily. 07/31/15   Narda Bonds, MD   Triage Vitals: BP 188/126 mmHg  Pulse 91  Temp(Src) 98.3 F (36.8 C) (Oral)  Resp 20  SpO2 99%  LMP 08/21/2015 Physical Exam  Constitutional: She is oriented to person, place, and time. She appears well-developed and well-nourished. No distress.  HENT:  Head: Normocephalic and atraumatic.  Right Ear: Tympanic membrane, external ear and ear canal normal.  Left Ear: Tympanic  membrane, external ear and ear canal normal.  Nose: Mucosal edema present. No rhinorrhea. Right sinus exhibits no maxillary sinus tenderness and no frontal sinus tenderness. Left sinus exhibits no maxillary sinus tenderness and no frontal sinus tenderness.  Mouth/Throat: Uvula is midline and mucous membranes are normal. Mucous membranes are not dry. No uvula swelling. Posterior oropharyngeal erythema present. No oropharyngeal exudate or posterior oropharyngeal edema.  Postnasal drip present  Eyes: Conjunctivae are normal. Right eye exhibits no discharge. Left eye exhibits no discharge. No scleral icterus.  Neck: Normal range of motion.  Cardiovascular: Normal rate, regular rhythm, normal heart sounds and intact distal pulses.   Pulmonary/Chest: Effort normal and breath sounds normal. No respiratory distress. She has no wheezes. She has no rales.  Musculoskeletal: Normal range of motion.  Moves all extremities spontaneously  Lymphadenopathy:    She has no cervical adenopathy.  Neurological: She is alert and oriented to person, place, and time. Coordination normal.  Skin: Skin is warm and dry.  Psychiatric: She has a normal mood and affect. Her behavior is normal.  Nursing note and vitals reviewed.  ED Course  Procedures (including critical care time) DIAGNOSTIC STUDIES: Oxygen Saturation is 99% on ra, nl by my interpretation.    COORDINATION OF CARE: 12:07 PM: Discussed treatment plan with pt at bedside; patient verbalizes understanding and agrees with treatment plan.  MDM                                             Final diagnoses:  URI (upper respiratory infection)   Patient presenting with sore throat with associated cough with clear phlegm, mild congestion and mild rhinorrhea x 1 days. VSS. Pt is nontoxic appearing. Nasal musosal edema noted. Post nasal drip present. TMs pearly gray without erythema or effusion. Oropharynx erythematous without edema or exudate. Lungs CTAB. Patients  symptoms are consistent with URI, likely viral etiology. Discussed that antibiotics are not indicated for viral infections. Pt will be discharged with symptomatic treatment. Pt also noted to be hypertensive which she states is her baseline BP. She is followed by her PCP fort his issue and recently had a medication change to attempt better control. She does admit to not taking her BP meds in 3 days and is going to pick up her BP prescription today. She denies dizziness, headache, vision changes, or chest pain. Strongly encouraged medication compliance and close follow up with her PCP. Verbalizes understanding and is agreeable with plan. Pt is hemodynamically stable & in NAD prior to dc.  I personally performed the services described in this documentation, which was scribed in my presence. The recorded information has been reviewed and is accurate.    Alveta Heimlich, PA-C 09/03/15 1258  Tilden Fossa, MD 09/04/15 [phone]

## 2015-09-03 NOTE — Discharge Instructions (Signed)
Schedule an appointment with your PCP to discuss BP control. Pick up your medication today and take as prescribed.  Upper Respiratory Infection, Adult Most upper respiratory infections (URIs) are a viral infection of the air passages leading to the lungs. A URI affects the nose, throat, and upper air passages. The most common type of URI is nasopharyngitis and is typically referred to as "the common cold." URIs run their course and usually go away on their own. Most of the time, a URI does not require medical attention, but sometimes a bacterial infection in the upper airways can follow a viral infection. This is called a secondary infection. Sinus and middle ear infections are common types of secondary upper respiratory infections. Bacterial pneumonia can also complicate a URI. A URI can worsen asthma and chronic obstructive pulmonary disease (COPD). Sometimes, these complications can require emergency medical care and may be life threatening.  CAUSES Almost all URIs are caused by viruses. A virus is a type of germ and can spread from one person to another.  RISKS FACTORS You may be at risk for a URI if:   You smoke.   You have chronic heart or lung disease.  You have a weakened defense (immune) system.   You are very young or very old.   You have nasal allergies or asthma.  You work in crowded or poorly ventilated areas.  You work in health care facilities or schools. SIGNS AND SYMPTOMS  Symptoms typically develop 2-3 days after you come in contact with a cold virus. Most viral URIs last 7-10 days. However, viral URIs from the influenza virus (flu virus) can last 14-18 days and are typically more severe. Symptoms may include:   Runny or stuffy (congested) nose.   Sneezing.   Cough.   Sore throat.   Headache.   Fatigue.   Fever.   Loss of appetite.   Pain in your forehead, behind your eyes, and over your cheekbones (sinus pain).  Muscle aches.  DIAGNOSIS    Your health care provider may diagnose a URI by:  Physical exam.  Tests to check that your symptoms are not due to another condition such as:  Strep throat.  Sinusitis.  Pneumonia.  Asthma. TREATMENT  A URI goes away on its own with time. It cannot be cured with medicines, but medicines may be prescribed or recommended to relieve symptoms. Medicines may help:  Reduce your fever.  Reduce your cough.  Relieve nasal congestion. HOME CARE INSTRUCTIONS   Take medicines only as directed by your health care provider.   Gargle warm saltwater or take cough drops to comfort your throat as directed by your health care provider.  Use a warm mist humidifier or inhale steam from a shower to increase air moisture. This may make it easier to breathe.  Drink enough fluid to keep your urine clear or pale yellow.   Eat soups and other clear broths and maintain good nutrition.   Rest as needed.   Return to work when your temperature has returned to normal or as your health care provider advises. You may need to stay home longer to avoid infecting others. You can also use a face mask and careful hand washing to prevent spread of the virus.  Increase the usage of your inhaler if you have asthma.   Do not use any tobacco products, including cigarettes, chewing tobacco, or electronic cigarettes. If you need help quitting, ask your health care provider. PREVENTION  The best way to protect  yourself from getting a cold is to practice good hygiene.   Avoid oral or hand contact with people with cold symptoms.   Wash your hands often if contact occurs.  There is no clear evidence that vitamin C, vitamin E, echinacea, or exercise reduces the chance of developing a cold. However, it is always recommended to get plenty of rest, exercise, and practice good nutrition.  SEEK MEDICAL CARE IF:   You are getting worse rather than better.   Your symptoms are not controlled by medicine.   You  have chills.  You have worsening shortness of breath.  You have brown or red mucus.  You have yellow or brown nasal discharge.  You have pain in your face, especially when you bend forward.  You have a fever.  You have swollen neck glands.  You have pain while swallowing.  You have white areas in the back of your throat. SEEK IMMEDIATE MEDICAL CARE IF:   You have severe or persistent:  Headache.  Ear pain.  Sinus pain.  Chest pain.  You have chronic lung disease and any of the following:  Wheezing.  Prolonged cough.  Coughing up blood.  A change in your usual mucus.  You have a stiff neck.  You have changes in your:  Vision.  Hearing.  Thinking.  Mood. MAKE SURE YOU:   Understand these instructions.  Will watch your condition.  Will get help right away if you are not doing well or get worse.   This information is not intended to replace advice given to you by your health care provider. Make sure you discuss any questions you have with your health care provider.   Document Released: 03/03/2001 Document Revised: 01/22/2015 Document Reviewed: 12/13/2013 Elsevier Interactive Patient Education [employer]

## 2016-02-26 ENCOUNTER — Other Ambulatory Visit: Payer: Self-pay | Admitting: Family Medicine

## 2016-04-14 ENCOUNTER — Other Ambulatory Visit: Payer: Self-pay | Admitting: *Deleted

## 2016-04-15 MED ORDER — AMLODIPINE BESYLATE 5 MG PO TABS: 5.0000 mg | ORAL_TABLET | Freq: Every day | ORAL | 1 refills | Status: DC

## 2016-10-01 ENCOUNTER — Encounter (HOSPITAL_COMMUNITY): Payer: Self-pay | Admitting: Emergency Medicine

## 2016-10-01 ENCOUNTER — Emergency Department (HOSPITAL_COMMUNITY)
Admission: EM | Admit: 2016-10-01 | Discharge: 2016-10-01 | Disposition: A | Discharge status: Home / Self Care | Payer: Self-pay | Attending: Dermatology | Admitting: Dermatology

## 2016-10-01 ENCOUNTER — Emergency Department (HOSPITAL_COMMUNITY)
Admission: EM | Admit: 2016-10-01 | Discharge: 2016-10-02 | Disposition: A | Discharge status: Home / Self Care | Payer: Self-pay | Attending: Emergency Medicine | Admitting: Emergency Medicine

## 2016-10-01 DIAGNOSIS — R040 Epistaxis: Secondary | ICD-10-CM | POA: Insufficient documentation

## 2016-10-01 DIAGNOSIS — Z79899 Other long term (current) drug therapy: Secondary | ICD-10-CM | POA: Insufficient documentation

## 2016-10-01 DIAGNOSIS — N182 Chronic kidney disease, stage 2 (mild): Secondary | ICD-10-CM | POA: Insufficient documentation

## 2016-10-01 DIAGNOSIS — Z5321 Procedure and treatment not carried out due to patient leaving prior to being seen by health care provider: Secondary | ICD-10-CM | POA: Insufficient documentation

## 2016-10-01 DIAGNOSIS — I129 Hypertensive chronic kidney disease with stage 1 through stage 4 chronic kidney disease, or unspecified chronic kidney disease: Secondary | ICD-10-CM | POA: Insufficient documentation

## 2016-10-01 MED ORDER — ALBUTEROL SULFATE (2.5 MG/3ML) 0.083% IN NEBU
INHALATION_SOLUTION | RESPIRATORY_TRACT | Status: DC
Start: 2016-10-01 — End: 2016-10-01
  Filled 2016-10-01: qty 6

## 2016-10-01 MED ORDER — HYDROCHLOROTHIAZIDE 25 MG PO TABS: 25.0000 mg | ORAL_TABLET | Freq: Every day | ORAL | 0 refills | Status: DC

## 2016-10-01 MED ORDER — AMLODIPINE BESYLATE 5 MG PO TABS
5.0000 mg | ORAL_TABLET | Freq: Once | ORAL | Status: AC
  Administered 2016-10-01: 5 mg via ORAL
  Filled 2016-10-01: qty 1

## 2016-10-01 MED ORDER — HYDROCHLOROTHIAZIDE 12.5 MG PO CAPS
25.0000 mg | ORAL_CAPSULE | Freq: Once | ORAL | Status: AC
  Administered 2016-10-01: 25 mg via ORAL
  Filled 2016-10-01: qty 2

## 2016-10-01 MED ORDER — OXYMETAZOLINE HCL 0.05 % NA SOLN
1.0000 | Freq: Once | NASAL | Status: AC
  Administered 2016-10-01: 1 via NASAL
  Filled 2016-10-01: qty 15

## 2016-10-01 MED ORDER — AMLODIPINE BESYLATE 5 MG PO TABS: 5.0000 mg | ORAL_TABLET | Freq: Every day | ORAL | 0 refills | Status: DC

## 2016-10-01 MED ORDER — SILVER NITRATE-POT NITRATE 75-25 % EX MISC
1.0000 | Freq: Once | CUTANEOUS | Status: DC
  Filled 2016-10-01: qty 1

## 2016-10-01 MED ORDER — OXYMETAZOLINE HCL 0.05 % NA SOLN: 1.0000 | Freq: Two times a day (BID) | NASAL | 0 refills | Status: DC

## 2016-10-01 NOTE — ED Notes (Signed)
Family at bedside. 

## 2016-10-01 NOTE — ED Notes (Signed)
Pt in waiting room, waiting to go to room.  Bleeding continues, pt advised to hold pressure to bridge of nose but refuses, st's it doesn't work.

## 2016-10-01 NOTE — ED Notes (Signed)
Pt waiting in lobby to go

## 2016-10-01 NOTE — ED Notes (Signed)
Patients nose currently is not bleeding

## 2016-10-01 NOTE — ED Notes (Signed)
[facility] Expected date:  Expected time:  Means of arrival:  Comments: 41 yr old nosebleed

## 2016-10-01 NOTE — ED Triage Notes (Signed)
Patient comes from [facility] waiting room due to not wanting to wait any longer. Patient took EMS to [facility] for [facility] waiting room. Patient states it started bleeding around 1630.

## 2016-10-01 NOTE — Discharge Instructions (Signed)
Please read and follow all provided instructions.  Your diagnoses today include:  1. Epistaxis     Tests performed today include: Vital signs. See below for your results today.   Medications prescribed:  Take as prescribed   Home care instructions:  Follow any educational materials contained in this packet.  Follow-up instructions: Please follow-up with your primary care provider for further evaluation of symptoms and treatment   Return instructions:  Please return to the Emergency Department if you do not get better, if you get worse, or new symptoms OR  - Fever (temperature greater than 101.3F)  - Bleeding that does not stop with holding pressure to the area    -Severe pain (please note that you may be more sore the day after your accident)  - Chest Pain  - Difficulty breathing  - Severe nausea or vomiting  - Inability to tolerate food and liquids  - Passing out  - Skin becoming red around your wounds  - Change in mental status (confusion or lethargy)  - New numbness or weakness    Please return if you have any other emergent concerns.  Additional Information:  Your vital signs today were: BP (!) 205/117 (BP Location: Left Arm)    Pulse 96    Temp 97.5 F (36.4 C) (Oral)    Resp 18    Ht 5\' 2"  (1.575 m)    Wt 104.3 kg    LMP 09/27/2016    SpO2 98%    BMI 42.07 kg/m  If your blood pressure (BP) was elevated above 135/85 this visit, please have this repeated by your doctor within one month. ---------------

## 2016-10-01 NOTE — ED Provider Notes (Signed)
[facility]-EMERGENCY DEPT Provider Note   CSN: [id number] Arrival date & time: 10/01/16  2208 .By signing my name below, I, Levon HedgerElizabeth Hall, attest that this documentation has been prepared under the direction and in the presence of non-physician practitioner, Audry Piliyler Haili Donofrio, PA-C. Electronically Signed: Levon HedgerElizabeth Hall, Scribe. 10/01/2016. 10:49 PM.   History   Chief Complaint Chief Complaint  Patient presents with  . Epistaxis   HPI Tamara Church is a 41 y.o. female with a hx of HTN and anemia who presents to the Emergency Department complaining of sudden onset, constant epistaxis which began at 4:30 this afternoon. She states she bled through 4-5 boxes of tissues while in the waiting room [facility]. No treatments tried PTA. Pt states she did not take her blood pressure medication today. She is not on blood thinners. She has no other complaints at this time.   The history is provided by the patient. No language interpreter was used.   Past Medical History:  Diagnosis Date  . Anemia   . Hypertension     Patient Active Problem List   Diagnosis Date Noted  . Scabies 03/23/2012  . CKD (chronic kidney disease) stage 2, GFR 60-89 ml/min 03/21/2012  . General counseling and advice on contraceptive management 01/31/2012  . Elevated serum creatinine 01/31/2012  . Encounter for IUD insertion 06/08/2011  . Breast engorgement 04/22/2011  . ANEMIA 09/07/2008  . GERD 09/04/2008  . OBESITY, NOS 11/18/2006  . HYPERTENSION, BENIGN SYSTEMIC 11/18/2006    Past Surgical History:  Procedure Laterality Date  . NO PAST SURGERIES      OB History    Gravida Para Term Preterm AB Living   6 6 6  0 0 6   SAB TAB Ectopic Multiple Live Births   0 0 0 0       Home Medications    Prior to Admission medications   Medication Sig Start Date End Date Taking? Authorizing Provider  amLODipine (NORVASC) 5 MG tablet Take 1 tablet (5 mg total) by mouth daily. 04/15/16   [place] N Rumley, DO    benzonatate (TESSALON) 100 MG capsule Take 1 capsule (100 mg total) by mouth every 8 (eight) hours. 06/28/15   Felicie Mornavid Smith, NP  Dextromethorphan-Guaifenesin 10-200 MG/5ML LIQD Take 5-10 mLs by mouth every 4 (four) hours as needed (Cough). 07/31/15   Narda Bondsalph A Nettey, MD  hydrochlorothiazide (HYDRODIURIL) 25 MG tablet Take 1 tablet (25 mg total) by mouth daily. 07/31/15   Narda Bondsalph A Nettey, MD  levonorgestrel (MIRENA) 20 MCG/24HR IUD 1 each by Intrauterine route once.    Historical Provider, MD  omeprazole (PRILOSEC) 20 MG capsule TAKE ONE CAPSULE BY MOUTH ONCE DAILY 02/27/16   Tyrone Nineyan B Grunz, MD    Family History Family History  Problem Relation Age of Onset  . Hypertension Mother     Social History Social History  Substance Use Topics  . Smoking status: Never Smoker  . Smokeless tobacco: Never Used  . Alcohol use No    Allergies   Ace inhibitors  Review of Systems Review of Systems 10 systems reviewed and all are negative for acute change except as noted in the HPI.   Physical Exam Updated Vital Signs BP (!) 205/117 (BP Location: Left Arm)   Pulse 96   Temp 97.5 F (36.4 C) (Oral)   Resp 18   Ht 5\' 2"  (1.575 m)   Wt 230 lb (104.3 kg)   LMP 09/27/2016   SpO2 98%   BMI 42.07  kg/m   Physical Exam  Constitutional: She is oriented to person, place, and time. Vital signs are normal. She appears well-developed and well-nourished. No distress.  HENT:  Head: Normocephalic and atraumatic.  Right Ear: Hearing normal.  Left Ear: Hearing normal.  Nose: No sinus tenderness, nasal deformity or septal deviation. Epistaxis is observed.  Blood noted in bilateral nares. NO active bleeding  Eyes: Conjunctivae and EOM are normal. Pupils are equal, round, and reactive to light.  Neck: Normal range of motion. Neck supple.  Cardiovascular: Normal rate and regular rhythm.   Pulmonary/Chest: Effort normal.  Abdominal: She exhibits no distension.  Musculoskeletal: Normal range of motion.   Neurological: She is alert and oriented to person, place, and time.  Skin: Skin is warm and dry.  Psychiatric: She has a normal mood and affect. Her speech is normal and behavior is normal. Thought content normal.  Nursing note and vitals reviewed.  ED Treatments / Results  DIAGNOSTIC STUDIES:  Oxygen Saturation is 98% on RA, normal by my interpretation.    COORDINATION OF CARE:  10:49 PM Discussed treatment plan with pt at bedside and pt agreed to plan.   Labs (all labs ordered are listed, but only abnormal results are displayed) Labs Reviewed - No data to display  EKG  EKG Interpretation None       Radiology No results found.  Procedures Procedures (including critical care time)  Medications Ordered in ED Medications  oxymetazoline (AFRIN) 0.05 % nasal spray 1 spray (1 spray Each Nare Given 10/01/16 2235)    Initial Impression / Assessment and Plan / ED Course  I have reviewed the triage vital signs and the nursing notes.  Pertinent labs & imaging results that were available during my care of the patient were reviewed by me and considered in my medical decision making (see chart for details).  Clinical Course     Final Clinical Impressions(s) / ED Diagnoses     {I have reviewed the relevant previous healthcare records.  {I obtained HPI from historian.   ED Course:  Assessment: Pt is a 40yF with hx HTN who presents with epistaxis today. No blood thinners. On exam, pt in NAD. Nontoxic/nonseptic appearing. VSS. Hypertensive, but did not take home BP meds Afebrile. Lungs CTA. Heart RRR. Bleeding from both nares. Direct pressure and Afrin controlled bleeding. Plan is to DC home with Afrin. BP normalized in ED after home BP meds. Refilled BP medication. At time of discharge, Patient is in no acute distress. Vital Signs are stable. Patient is able to ambulate. Patient able to tolerate PO.   Disposition/Plan:  DC Home Additional Verbal discharge instructions given and  discussed with patient.  Pt Instructed to f/u with PCP in the next week for evaluation and treatment of symptoms. Return precautions given Pt acknowledges and agrees with plan  Supervising Physician Lyndal Pulley, MD   Final diagnoses:  Epistaxis   New Prescriptions New Prescriptions   No medications on file   I personally performed the services described in this documentation, which was scribed in my presence. The recorded information has been reviewed and is accurate.    Audry Pili, PA-C 10/01/16 2347    Lyndal Pulley, MD 10/02/16 [phone]

## 2016-10-01 NOTE — ED Notes (Signed)
Patient nose is not currently bleeding.

## 2016-10-01 NOTE — ED Triage Notes (Signed)
Pt c/o nosebleed started at 1630 today-- states was bleeding out of both nostrils, then only left nares. Currently feels blood running done back of throat, making pt feel like she is choking. Pt in no resp distress at present

## 2016-10-01 NOTE — ED Notes (Signed)
Patient has not taken her blood pressure today.

## 2016-10-20 ENCOUNTER — Encounter: Payer: Self-pay | Admitting: Family Medicine

## 2016-10-20 ENCOUNTER — Ambulatory Visit (INDEPENDENT_AMBULATORY_CARE_PROVIDER_SITE_OTHER): Payer: Self-pay | Admitting: Family Medicine

## 2016-10-20 VITALS — BP 146/100 | Temp 98.2°F | Ht 62.0 in | Wt 210.6 lb

## 2016-10-20 DIAGNOSIS — I10 Essential (primary) hypertension: Secondary | ICD-10-CM

## 2016-10-20 DIAGNOSIS — R040 Epistaxis: Secondary | ICD-10-CM

## 2016-10-20 MED ORDER — AMLODIPINE BESYLATE 10 MG PO TABS
10.0000 mg | ORAL_TABLET | Freq: Every day | ORAL | 3 refills | Status: DC
Start: 2016-10-20 — End: 2016-10-26

## 2016-10-20 MED ORDER — OMEPRAZOLE 20 MG PO CPDR: 20.0000 mg | DELAYED_RELEASE_CAPSULE | Freq: Every day | ORAL | 3 refills | Status: DC | PRN

## 2016-10-20 NOTE — Progress Notes (Signed)
Subjective:     Patient ID: Tamara Church, female   DOB: 05/23/1976, 41 y.o.   MRN: [id number]  HPI Tamara Church is a 41yo female presenting today for [name]. Seen in ED on 1/11 for epistaxis. Was prescribed Afrin and asked to follow up with PCP. Since visit to ED, denies further episodes of Epistaxis. Has only needed to use Afrin for one day. Denies nasal congestion. Notes occasional GERD. Reports Omeprazole has worked for her in the past, but she is out. Requests refill. History of hypertension noted. Has taken her medications as prescribed, including HCTZ, and Amlodipine. Denies chest pain, shortness of breath, headache, weakness, blurred vision.  Review of Systems Per HPI    Objective:   Physical Exam  Constitutional: She appears well-developed and well-nourished. No distress.  HENT:  Mouth/Throat: Oropharynx is clear and moist.  Pink nasal conchae without evidence of bleeding  Cardiovascular: Normal rate and regular rhythm.   No murmur heard. Pulmonary/Chest: Effort normal. No respiratory distress. She has no wheezes.  Abdominal: Soft. She exhibits no distension. There is no tenderness.  Psychiatric: She has a normal mood and affect. Her behavior is normal.       Assessment and Plan:     1. Epistaxis Resolved. Discussed nasal vaseline, humidifier, only using Afrin for 3 days if needed.   2. Essential hypertension Elevated today above goal. Amlodipine increased to 10mg  dosage. Continue HCTZ. Follow up for BP check, labs, pap smear.

## 2016-10-20 NOTE — Patient Instructions (Signed)
Thank you so much for coming to visit today! Your blood pressure was above your goal of 130/90 today. We will increase your Amlodipine to 10mg  daily; you may take two of your 5mg  tablets until you run out.  Please return in 2 weeks to recheck your blood pressure, check labs, and have your pap smear done.  Dr. Cloretta Nedumley   Nosebleed, Adult A nosebleed is when blood comes out of the nose. Nosebleeds are common. Usually, they are not a sign of a serious condition. Nosebleeds can happen if a small blood vessel in your nose starts to bleed or if the lining of your nose (mucous membrane) cracks. They are commonly caused by:  Allergies.  Colds.  Picking your nose.  Blowing your nose too hard.  An injury from sticking an object into your nose or getting hit in the nose.  Dry or cold air. Less common causes of nosebleeds include:  Toxic fumes.  Something abnormal in the nose or in the air-filled spaces in the bones of the face (sinuses).  Growths in the nose, such as polyps.  Medicines or conditions that cause blood to clot slowly.  Certain illnesses or procedures that irritate or dry out the nasal passages. Follow these instructions at home: When you have a nosebleed:  Sit down and tilt your head slightly forward.  Use a clean towel or tissue to pinch your nostrils under the bony part of your nose. After 10 minutes, let go of your nose and see if bleeding starts again. Do not release pressure before that time. If there is still bleeding, repeat the pinching and holding for 10 minutes until the bleeding stops.  Do not place tissues or gauze in the nose to stop bleeding.  Avoid lying down and avoid tilting your head backward. That may make blood collect in the throat and cause gagging or coughing.  Use a nasal spray decongestant to help with a nosebleed as told by your health care provider.  Do not use petroleum jelly or mineral oil in your nose. It can drip into your lungs. After a  nosebleed:  Avoid blowing your nose or sniffing for a number of hours.  Avoid straining, lifting, or bending at the waist for several days. You may resume other normal activities as you are able.  Use saline spray or a humidifier as told by your health care provider.  Aspirinand blood thinners make bleeding more likely. If you are prescribed these medicines and you suffer from nosebleeds:  Ask your health care provider if you should stop taking the medicines or if you should adjust the dose.  Do not stop taking medicines that your health care provider has recommended unless told by your health care provider.  If your nosebleed was caused by dry mucous membranes, use over-the-counter saline nasal spray or gel. This will keep the mucous membranes moist and allow them to heal. If you must use a lubricant:  Choose one that is water-soluble.  Use only as much as you need and use it only as often as needed.  Do not lie down until several hours after you use it. Contact a health care provider if:  You have a fever.  You get nosebleeds often or more often than usual.  You bruise very easily.  You have a nosebleed from having something stuck in your nose.  You have bleeding in your mouth.  You vomit or cough up brown material.  You have a nosebleed after you start a new  medicine. Get help right away if:  You have a nosebleed after a fall or a head injury.  Your nosebleed does not go away after 20 minutes.  You feel dizzy or weak.  You have unusual bleeding from other parts of your body.  You have unusual bruising on other parts of your body.  You become sweaty.  You vomit blood. This information is not intended to replace advice given to you by your health care provider. Make sure you discuss any questions you have with your health care provider. Document Released: 06/17/2005 Document Revised: 05/07/2016 Document Reviewed: 03/24/2016 Elsevier Interactive Patient Education   2017 [employer].

## 2016-10-22 ENCOUNTER — Ambulatory Visit (INDEPENDENT_AMBULATORY_CARE_PROVIDER_SITE_OTHER): Payer: Self-pay | Admitting: Internal Medicine

## 2016-10-22 VITALS — BP 158/102 | HR 106 | Temp 98.8°F | Ht 62.0 in | Wt 212.8 lb

## 2016-10-22 DIAGNOSIS — J040 Acute laryngitis: Secondary | ICD-10-CM

## 2016-10-22 DIAGNOSIS — J329 Chronic sinusitis, unspecified: Secondary | ICD-10-CM | POA: Insufficient documentation

## 2016-10-22 DIAGNOSIS — J020 Streptococcal pharyngitis: Secondary | ICD-10-CM

## 2016-10-22 LAB — POCT RAPID STREP A (OFFICE): RAPID STREP A SCREEN: NEGATIVE

## 2016-10-22 MED ORDER — FLUTICASONE PROPIONATE 50 MCG/ACT NA SUSP: 2.0000 | Freq: Every day | NASAL | 6 refills | Status: DC

## 2016-10-22 MED ORDER — LORATADINE 10 MG PO TABS: 10.0000 mg | ORAL_TABLET | Freq: Every day | ORAL | 11 refills | Status: DC

## 2016-10-22 MED ORDER — IBUPROFEN 600 MG PO TABS: 600.0000 mg | ORAL_TABLET | Freq: Four times a day (QID) | ORAL | 0 refills | Status: DC | PRN

## 2016-10-22 NOTE — Progress Notes (Signed)
   Subjective:    Tamara Church - 41 y.o. female MRN [id number]  Date of birth: 10-29-1975  HPI  Tamara Church is here for sore throat.  Sore Throat: Started 1-2 days ago with a lot of scratchiness and pain. Then she subsequently lost her voice yesterday. Reports that she has a history of allergies for which she does not take medications. Has had a few days of increased nasal congestion and drainage for a few days prior to the throat pain. Denies SOB and sensation of throat closing up. She is drinking some fluids but not as much as usual and swallowing solid foods is painful. She denies fevers and diffuse body aches at home. No ear pain. No nausea, vomiting, or diarrhea. Has been trying hot tea with honey and lemon without much relief. No known sick contacts.   -  reports that she has never smoked. She has never used smokeless tobacco. - Review of Systems: Per HPI. - Past Medical History: Patient Active Problem List   Diagnosis Date Noted  . Laryngitis 10/22/2016  . Scabies 03/23/2012  . CKD (chronic kidney disease) stage 2, GFR 60-89 ml/min 03/21/2012  . General counseling and advice on contraceptive management 01/31/2012  . Elevated serum creatinine 01/31/2012  . Encounter for IUD insertion 06/08/2011  . Breast engorgement 04/22/2011  . ANEMIA 09/07/2008  . GERD 09/04/2008  . OBESITY, NOS 11/18/2006  . HYPERTENSION, BENIGN SYSTEMIC 11/18/2006   - Medications: reviewed and updated   Objective:   Physical Exam BP (!) 158/102   Pulse (!) 106   Temp 98.8 F (37.1 C) (Oral)   Ht 5\' 2"  (1.575 m)   Wt 212 lb 12.8 oz (96.5 kg)   LMP 09/27/2016   SpO2 99%   BMI 38.92 kg/m  Gen: NAD, alert, cooperative with exam, appears ill but non-toxic, whispers throughout exam  HEENT: NCAT, oropharynx erythematous with tonsillar edema or exudates, nasal turbinates significantly enlarged R > L, TMs normal bilaterally, mildly dry mucus membranes CV: mildly tachycardic RR, good S1/S2, no  murmur Resp: CTABL, no wheezes, non-labored   Assessment & Plan:   Laryngitis Acute. Suspect related to URI or allergic rhinitis. May also be worsened by patient's history of GERD. Rapid strep negative. No red flag signs or symptoms of anaphylaxis. Lack of fever and myalgias makes me less suspicious for influenza. Patient appears to be mildly dehydrated on exam.  -Ibuprofen 600 mg q8h prn for pain  -Rx for Claritin and Flonase given  -recommended Chloraseptic spray for pain relief  -continue with hot liquids and vocal rest  -importance of adequate hydration emphasized and discussed when to seek care for dehydration  -return in 2 weeks if still having loss of voice     [name], D.O. 10/22/2016, 3:54 PM PGY-2, [facility]

## 2016-10-22 NOTE — Patient Instructions (Addendum)
You have laryngitis, inflammation of your vocal cords, caused by a cold and your allergies. I would recommend a Chloraseptic numbing spray to help with pain relief. I have also prescribed some high dose Ibuprofen to help with your pain. I have sent in an allergy medication, Claritin, and Flonase, a nasal steroid, to help with your allergies. This will also help with your congestion with this cold. You can also use nasal saline spray to help keep the nose moist and flush the excess congestion.   If you start having very low urine output or are unable to drink fluids due to pain you should be seen. If you are still having loss of voice in 2 weeks please return to the clinic.

## 2016-10-22 NOTE — Assessment & Plan Note (Signed)
Acute. Suspect related to URI or allergic rhinitis. May also be worsened by patient's history of GERD. Rapid strep negative. No red flag signs or symptoms of anaphylaxis. Lack of fever and myalgias makes me less suspicious for influenza. Patient appears to be mildly dehydrated on exam.  -Ibuprofen 600 mg q8h prn for pain  -Rx for Claritin and Flonase given  -recommended Chloraseptic spray for pain relief  -continue with hot liquids and vocal rest  -importance of adequate hydration emphasized and discussed when to seek care for dehydration  -return in 2 weeks if still having loss of voice

## 2016-10-26 ENCOUNTER — Telehealth: Payer: Self-pay | Admitting: Family Medicine

## 2016-10-26 ENCOUNTER — Telehealth: Payer: Self-pay

## 2016-10-26 MED ORDER — AMLODIPINE BESYLATE 10 MG PO TABS: 10.0000 mg | ORAL_TABLET | Freq: Every day | ORAL | 3 refills | Status: DC

## 2016-10-26 MED ORDER — HYDROCHLOROTHIAZIDE 25 MG PO TABS: 25.0000 mg | ORAL_TABLET | Freq: Every day | ORAL | 0 refills | Status: DC

## 2016-10-26 NOTE — Telephone Encounter (Signed)
HCTZ and Amlodipine sent to pharmacy.

## 2016-10-26 NOTE — Telephone Encounter (Signed)
Needs refill on HCTZ and amlodipine. Will be out of medication tomorrow. Please call when it has been sent.  Sunday SpillersSharon T Saunders, CMA

## 2016-10-26 NOTE — Telephone Encounter (Signed)
Pt informed. Zimmerman Rumple, April D, CMA  

## 2016-12-22 ENCOUNTER — Other Ambulatory Visit: Payer: Self-pay | Admitting: Family Medicine

## 2017-04-15 ENCOUNTER — Emergency Department (HOSPITAL_COMMUNITY)
Admission: EM | Admit: 2017-04-15 | Discharge: 2017-04-15 | Disposition: A | Discharge status: Home / Self Care | Payer: No Typology Code available for payment source | Attending: Emergency Medicine | Admitting: Emergency Medicine

## 2017-04-15 ENCOUNTER — Emergency Department (HOSPITAL_COMMUNITY): Payer: No Typology Code available for payment source

## 2017-04-15 ENCOUNTER — Encounter (HOSPITAL_COMMUNITY): Payer: Self-pay | Admitting: Emergency Medicine

## 2017-04-15 DIAGNOSIS — Z79899 Other long term (current) drug therapy: Secondary | ICD-10-CM | POA: Diagnosis not present

## 2017-04-15 DIAGNOSIS — Y939 Activity, unspecified: Secondary | ICD-10-CM | POA: Insufficient documentation

## 2017-04-15 DIAGNOSIS — S161XXA Strain of muscle, fascia and tendon at neck level, initial encounter: Secondary | ICD-10-CM | POA: Insufficient documentation

## 2017-04-15 DIAGNOSIS — Y999 Unspecified external cause status: Secondary | ICD-10-CM | POA: Insufficient documentation

## 2017-04-15 DIAGNOSIS — I129 Hypertensive chronic kidney disease with stage 1 through stage 4 chronic kidney disease, or unspecified chronic kidney disease: Secondary | ICD-10-CM | POA: Diagnosis not present

## 2017-04-15 DIAGNOSIS — N182 Chronic kidney disease, stage 2 (mild): Secondary | ICD-10-CM | POA: Insufficient documentation

## 2017-04-15 DIAGNOSIS — Y92410 Unspecified street and highway as the place of occurrence of the external cause: Secondary | ICD-10-CM | POA: Insufficient documentation

## 2017-04-15 DIAGNOSIS — S39012A Strain of muscle, fascia and tendon of lower back, initial encounter: Secondary | ICD-10-CM | POA: Diagnosis not present

## 2017-04-15 DIAGNOSIS — S0083XA Contusion of other part of head, initial encounter: Secondary | ICD-10-CM | POA: Diagnosis not present

## 2017-04-15 DIAGNOSIS — S0990XA Unspecified injury of head, initial encounter: Secondary | ICD-10-CM | POA: Diagnosis present

## 2017-04-15 DIAGNOSIS — I10 Essential (primary) hypertension: Secondary | ICD-10-CM

## 2017-04-15 LAB — POC URINE PREG, ED: Preg Test, Ur: NEGATIVE

## 2017-04-15 MED ORDER — METHOCARBAMOL 500 MG PO TABS: 500.0000 mg | ORAL_TABLET | Freq: Two times a day (BID) | ORAL | 0 refills | Status: DC

## 2017-04-15 NOTE — Discharge Instructions (Signed)
Return if any problems. Take your blood pressure medication as you are suppose to.

## 2017-04-15 NOTE — ED Notes (Signed)
Spoke with Denny PeonErin PA about pt blood pressure, pt states she takes her BP meds at night and was on her way home to take them. PA aware of BP and okay to see patient in pod C
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
## 2017-12-13 ENCOUNTER — Other Ambulatory Visit: Payer: Self-pay

## 2017-12-13 MED ORDER — AMLODIPINE BESYLATE 10 MG PO TABS: 10.0000 mg | ORAL_TABLET | Freq: Every day | ORAL | 3 refills | Status: DC

## 2017-12-23 ENCOUNTER — Ambulatory Visit (INDEPENDENT_AMBULATORY_CARE_PROVIDER_SITE_OTHER): Payer: Self-pay | Admitting: Family Medicine

## 2017-12-23 ENCOUNTER — Encounter: Payer: Self-pay | Admitting: Family Medicine

## 2017-12-23 ENCOUNTER — Other Ambulatory Visit: Payer: Self-pay

## 2017-12-23 VITALS — BP 120/70 | HR 78 | Temp 98.6°F | Ht 62.0 in | Wt 226.4 lb

## 2017-12-23 DIAGNOSIS — Z3009 Encounter for other general counseling and advice on contraception: Secondary | ICD-10-CM

## 2017-12-23 NOTE — Assessment & Plan Note (Signed)
Will schedule an appointment for next week for Mirena IUD removal and insertion.  Will work on arranging for [employer] scholarship since patient does not currently have insurance.  Told patient that I will contact her if we need to move her appointment in case it takes more time to arrange for the Mirena scholarship.  Advised patient to use condoms during sex until a new Mirena is placed and to take about 600 mg ibuprofen about one hour before appointment to place Mirena.

## 2017-12-23 NOTE — Patient Instructions (Signed)
It was nice meeting you today Ms. Rogelio SeenMcCall!  Today, we talked about getting you set up for replacing your Mirena with a new one.  Please make an appointment with me for one week from now and ask them to block of 2 slots for your appointment.  I will be in contact with you if we need to change that appointment date since I'll be working with our lady who sets up [employer] scholarships.  The day of your appointment, please take about 600 mg ibuprofen an hour before coming in.  Please also use condoms every time you have sex before your appointment.  If you have any questions or concerns, please feel free to call the clinic.   Be well,  Dr. Frances FurbishWinfrey

## 2017-12-23 NOTE — Progress Notes (Signed)
   Subjective:    Tamara Cromerameka L Yaun - 42 y.o. female MRN [id number]  Date of birth: [birth year]  HPI  Tamara Church is here for contraception management.  She has had the Mirena IUD for six years and was told that she needs to get this replaced since it expires after five years.  After discussing other options for contraception, she decided that she would like to continue using Mirena for birth control.  She does not think she could be pregnant because she had her period last week and has not been sexually active for a while.  However, she would like for her IUD to be replaced as soon as possible.   Health Maintenance:  Health Maintenance Due  Topic Date Due  . PAP SMEAR  11/07/2013    -  reports that she has never smoked. She has never used smokeless tobacco. - Review of Systems: Per HPI. - Past Medical History: Patient Active Problem List   Diagnosis Date Noted  . Laryngitis 10/22/2016  . Scabies 03/23/2012  . CKD (chronic kidney disease) stage 2, GFR 60-89 ml/min 03/21/2012  . Contraceptive management 01/31/2012  . Elevated serum creatinine 01/31/2012  . Encounter for IUD insertion 06/08/2011  . Breast engorgement 04/22/2011  . ANEMIA 09/07/2008  . GERD 09/04/2008  . OBESITY, NOS 11/18/2006  . HYPERTENSION, BENIGN SYSTEMIC 11/18/2006   - Medications: reviewed and updated   Objective:   Physical Exam BP 120/70   Pulse 78   Temp 98.6 F (37 C) (Oral)   Ht 5\' 2"  (1.575 m)   Wt 226 lb 6.4 oz (102.7 kg)   SpO2 98%   BMI 41.41 kg/m  Gen: NAD, alert, cooperative with exam, well-appearing HEENT: NCAT, clear conjunctiva, supple neck CV: RRR, good S1/S2, no murmur, no edema, capillary refill brisk  Resp: CTABL, no wheezes, non-labored Skin: no rashes, normal turgor  Neuro: no gross deficits.  Psych: good insight, alert and oriented      Assessment & Plan:   Contraceptive management Will schedule an appointment for next week for Mirena IUD removal and insertion.  Will  work on arranging for [employer] scholarship since patient does not currently have insurance.  Told patient that I will contact her if we need to move her appointment in case it takes more time to arrange for the Mirena scholarship.  Advised patient to use condoms during sex until a new Mirena is placed and to take about 600 mg ibuprofen about one hour before appointment to place Mirena.    Lezlie OctaveAmanda Winfrey, M.D. 12/23/2017, 4:22 PM PGY-1, [facility]

## 2017-12-29 ENCOUNTER — Telehealth: Payer: Self-pay | Admitting: Family Medicine

## 2017-12-29 ENCOUNTER — Other Ambulatory Visit: Payer: Self-pay | Admitting: Family Medicine

## 2017-12-29 DIAGNOSIS — Z3009 Encounter for other general counseling and advice on contraception: Secondary | ICD-10-CM

## 2017-12-29 MED ORDER — MEDROXYPROGESTERONE ACETATE 150 MG/ML IM SUSP: 150.0000 mg | Freq: Once | INTRAMUSCULAR | Status: DC

## 2017-12-29 NOTE — Telephone Encounter (Signed)
Called patient to let her know that the Mirena scholarship will take 1-2 months to process and that she will need to come in to complete the application and to bring her federal tax return and last four pay stubs of total household income.  Also offered to give her oral contraceptives or a depo shot at her appointment on 4/15.  She will bring the necessary information in on 4/15 and would like the depo shot at that time.  I've ordered the depo shot in preparation for that appointment.

## 2018-01-03 ENCOUNTER — Ambulatory Visit: Payer: Self-pay | Admitting: Family Medicine

## 2018-01-14 ENCOUNTER — Other Ambulatory Visit: Payer: Self-pay

## 2018-01-14 MED ORDER — HYDROCHLOROTHIAZIDE 25 MG PO TABS: 25.0000 mg | ORAL_TABLET | Freq: Every day | ORAL | 3 refills | Status: DC

## 2018-01-17 ENCOUNTER — Other Ambulatory Visit: Payer: Self-pay

## 2018-01-17 MED ORDER — HYDROCHLOROTHIAZIDE 25 MG PO TABS: 25.0000 mg | ORAL_TABLET | Freq: Every day | ORAL | 3 refills | Status: DC

## 2018-01-17 NOTE — Telephone Encounter (Signed)
Pt requesting refill of hctz Pt call back [phone] Shawna Orleans, RN

## 2018-05-20 ENCOUNTER — Emergency Department (HOSPITAL_COMMUNITY): Payer: Self-pay

## 2018-05-20 ENCOUNTER — Encounter (HOSPITAL_COMMUNITY): Payer: Self-pay | Admitting: *Deleted

## 2018-05-20 ENCOUNTER — Emergency Department (HOSPITAL_COMMUNITY)
Admission: EM | Admit: 2018-05-20 | Discharge: 2018-05-20 | Disposition: A | Discharge status: Home / Self Care | Payer: Self-pay | Attending: Emergency Medicine | Admitting: Emergency Medicine

## 2018-05-20 DIAGNOSIS — I129 Hypertensive chronic kidney disease with stage 1 through stage 4 chronic kidney disease, or unspecified chronic kidney disease: Secondary | ICD-10-CM | POA: Insufficient documentation

## 2018-05-20 DIAGNOSIS — X501XXA Overexertion from prolonged static or awkward postures, initial encounter: Secondary | ICD-10-CM | POA: Insufficient documentation

## 2018-05-20 DIAGNOSIS — Y939 Activity, unspecified: Secondary | ICD-10-CM | POA: Insufficient documentation

## 2018-05-20 DIAGNOSIS — N182 Chronic kidney disease, stage 2 (mild): Secondary | ICD-10-CM | POA: Insufficient documentation

## 2018-05-20 DIAGNOSIS — M25531 Pain in right wrist: Secondary | ICD-10-CM | POA: Insufficient documentation

## 2018-05-20 DIAGNOSIS — Z79899 Other long term (current) drug therapy: Secondary | ICD-10-CM | POA: Insufficient documentation

## 2018-05-20 DIAGNOSIS — Y929 Unspecified place or not applicable: Secondary | ICD-10-CM | POA: Insufficient documentation

## 2018-05-20 DIAGNOSIS — Y999 Unspecified external cause status: Secondary | ICD-10-CM | POA: Insufficient documentation

## 2018-05-20 NOTE — ED Notes (Signed)
Pt verbalized understanding of discharge instructions and denies any further questions at this time.   

## 2018-05-20 NOTE — ED Triage Notes (Signed)
Pt in c/o right wrist pain after pushing up to get off her couch last night, no deformity noted, increased pain with movement

## 2018-05-20 NOTE — ED Provider Notes (Signed)
[facility] Provider Note   CSN: [id number] Arrival date & time: 05/20/18  0827     History   Chief Complaint Chief Complaint  Patient presents with  . Arm Pain    HPI Tamara Church is a 42 y.o. female presenting for right wrist pain after pushing herself up off of her couch last night.  Patient states that she felt a sharp pain on the ulnar side of her right wrist that she describes as 5/10 in severity.  Patient states that she has used Tylenol for her pain with some relief.  Patient states that movement and palpation of the wrist increase her pain.  HPI  Past Medical History:  Diagnosis Date  . Anemia   . Hypertension     Patient Active Problem List   Diagnosis Date Noted  . Laryngitis 10/22/2016  . Scabies 03/23/2012  . CKD (chronic kidney disease) stage 2, GFR 60-89 ml/min 03/21/2012  . Contraceptive management 01/31/2012  . Elevated serum creatinine 01/31/2012  . Encounter for IUD insertion 06/08/2011  . Breast engorgement 04/22/2011  . ANEMIA 09/07/2008  . GERD 09/04/2008  . OBESITY, NOS 11/18/2006  . HYPERTENSION, BENIGN SYSTEMIC 11/18/2006    Past Surgical History:  Procedure Laterality Date  . NO PAST SURGERIES       OB History    Gravida  6   Para  6   Term  6   Preterm  0   AB  0   Living  6     SAB  0   TAB  0   Ectopic  0   Multiple  0   Live Births               Home Medications    Prior to Admission medications   Medication Sig Start Date End Date Taking? Authorizing Provider  amLODipine (NORVASC) 10 MG tablet Take 1 tablet (10 mg total) by mouth daily. 12/13/17   Lennox SoldersWinfrey, Amanda C, MD  fluticasone (FLONASE) 50 MCG/ACT nasal spray Place 2 sprays into both nostrils daily. 10/22/16   Arvilla MarketWallace, Catherine Lauren, DO  hydrochlorothiazide (HYDRODIURIL) 25 MG tablet Take 1 tablet (25 mg total) by mouth daily. 01/17/18   Lennox SoldersWinfrey, Amanda C, MD  ibuprofen (ADVIL,MOTRIN) 600 MG tablet Take 1  tablet (600 mg total) by mouth every 6 (six) hours as needed. 10/22/16   Arvilla MarketWallace, Catherine Lauren, DO  levonorgestrel (MIRENA) 20 MCG/24HR IUD 1 each by Intrauterine route once.    [provider]  loratadine (CLARITIN) 10 MG tablet Take 1 tablet (10 mg total) by mouth daily. 10/22/16   Arvilla MarketWallace, Catherine Lauren, DO  methocarbamol (ROBAXIN) 500 MG tablet Take 1 tablet (500 mg total) by mouth 2 (two) times daily. 04/15/17   Elson AreasSofia, Leslie K, PA-C  omeprazole (PRILOSEC) 20 MG capsule Take 1 capsule (20 mg total) by mouth daily as needed. 10/20/16   Rumley, Lora Havensaleigh N, DO  oxymetazoline (AFRIN NASAL SPRAY) 0.05 % nasal spray Place 1 spray into both nostrils 2 (two) times daily. 10/01/16   Audry PiliMohr, Tyler, PA-C    Family History Family History  Problem Relation Age of Onset  . Hypertension Mother     Social History Social History   Tobacco Use  . Smoking status: Never Smoker  . Smokeless tobacco: Never Used  Substance Use Topics  . Alcohol use: No  . Drug use: No     Allergies   Ace inhibitors   Review of Systems Review of Systems  Constitutional: Negative.  Negative for chills, fatigue and fever.  Musculoskeletal: Positive for arthralgias. Negative for joint swelling.  Skin: Negative.  Negative for color change, pallor and wound.  Neurological: Negative for weakness and numbness.     Physical Exam Updated Vital Signs BP (!) 139/97 (BP Location: Right Arm)   Pulse 90   Temp 98 F (36.7 C) (Oral)   Resp 20   SpO2 100%   Physical Exam  Constitutional: She is oriented to person, place, and time. She appears well-developed and well-nourished. No distress.  HENT:  Head: Normocephalic and atraumatic.  Right Ear: External ear normal.  Left Ear: External ear normal.  Nose: Nose normal.  Eyes: Pupils are equal, round, and reactive to light. EOM are normal.  Neck: Trachea normal and normal range of motion. No tracheal deviation present.  Pulmonary/Chest: Effort normal. No  respiratory distress.  Musculoskeletal: Normal range of motion.       Right elbow: Normal.      Right wrist: She exhibits tenderness. She exhibits normal range of motion, no swelling, no effusion, no crepitus and no deformity.       Arms:      Right hand: Normal. Normal sensation noted. Normal strength noted.  Right hand: No gross deformities, skin intact. Fingers appear normal.  Tenderness to palpation over ulnar aspect of wrist.  No snuffbox tenderness to palpation. No tenderness to palpation over flexor sheath.  Finger adduction/abduction intact with 5/5 strength.  Thumb opposition intact.   Full active and resisted ROM to flexion/extension at wrist with increased pain.  Full active and resisted ROM to flexion and extension of MCP, PIP and DIP of all fingers.  FDS/FDP intact. Grip 5/5 strength.  Radial artery 2+ with <2sec cap refill in all fingers.  Sensation intact to light-tough in median/ulnar/radial distributions.   Neurological: She is alert and oriented to person, place, and time. She has normal strength. No sensory deficit.  Skin: Skin is warm and dry. Capillary refill takes less than 2 seconds.  Psychiatric: She has a normal mood and affect. Her behavior is normal.     ED Treatments / Results  Labs (all labs ordered are listed, but only abnormal results are displayed) Labs Reviewed - No data to display  EKG None  Radiology Dg Wrist Complete Right  Result Date: 05/20/2018 CLINICAL DATA:  Right wrist pain.  Pain, increased with movement. EXAM: RIGHT WRIST - COMPLETE 3+ VIEW COMPARISON:  No recent prior. FINDINGS: No acute bony or joint abnormality identified. No evidence of fracture dislocation. IMPRESSION: No acute bony abnormality. Electronically Signed   By: Maisie Fus  Register   On: 05/20/2018 09:16    Procedures Procedures (including critical care time)  Medications Ordered in ED Medications - No data to display   Initial Impression / Assessment and Plan / ED  Course  I have reviewed the triage vital signs and the nursing notes.  Pertinent labs & imaging results that were available during my care of the patient were reviewed by me and considered in my medical decision making (see chart for details).     The patient was noted to have elevated BP in ED today. Hx of HTN and has not taken daily medications today. I have spoken with the patient regarding elevated blood pressure readings and the need for improved management. I instructed the patient to followup with their PCP within 1 week for BP check. I also counseled the patient regarding the signs and symptoms which would require an emergent  visit to an emergency department for hypertensive urgency and/or hypertensive emergency.  Patient presenting with right wrist pain that began last night after getting up from the couch.    No deformity on examination.  Patient is neurovascularly intact to right hand wrist and arm.  Patient with 5/5 strength to the wrist and hand.  Full range of motion however with increased pain.  DG of right wrist negative.   Patient provided with wrist splint here in the emergency department.  Patient with history of kidney disease, informed not to take ibuprofen.  Patient informed that she may use over-the-counter Tylenol for her pain as directed on the packaging.  Patient states that she is good follow-up with her family practice provider.  I have encouraged her to follow-up with them, I have also encouraged rice therapy for her pain.  At this time there does not appear to be any evidence of an acute emergency medical condition and the patient appears stable for discharge with appropriate outpatient follow up. Diagnosis was discussed with patient who verbalizes understanding of care plan and is agreeable to discharge. I have discussed return precautions with patient who verbalize understanding of return precautions. Patient strongly encouraged to follow-up with their PCP. All questions  answered.   Note: Portions of this report may have been transcribed using voice recognition software. Every effort was made to ensure accuracy; however, inadvertent computerized transcription errors may still be present.   Final Clinical Impressions(s) / ED Diagnoses   Final diagnoses:  Acute pain of right wrist    ED Discharge Orders    None       Elizabeth Palau 05/20/18 1635    Tegeler, Canary Brim, MD 05/22/18 [phone]

## 2018-05-20 NOTE — Discharge Instructions (Addendum)
Please return to the Emergency Department for any new or worsening symptoms or if your symptoms do not improve. Please be sure to follow up with your Primary Care Physician as soon as possible regarding your visit today. If you do not have a Primary Doctor please use the resources below to establish one. Please use rest ice compression and elevation to help with your pain.  He may also use over-the-counter Tylenol as directed on the packaging to help with pain.  Please avoid using ibuprofen due to your kidney disease. Please make sure to monitor your blood pressure closely and take all of your home blood pressure medications as prescribed by your primary care doctor.  Contact a health care provider if: Your pain increases, and medicine does not help. Your joint pain does not improve within 3 days. You have increased bruising or swelling. You have a fever. You lose 10 lb (4.5 kg) or more without trying. Get help right away if: You are not able to move the joint. Your fingers or toes become numb or they turn cold and blue.  RESOURCE GUIDE  Chronic Pain Problems: Contact [facility]  [phone] Patients need to be referred by their primary care doctor.  Insufficient Money for Medicine: Contact United Way:  call "211" or [facility] [phone].  No Primary Care Doctor: Call Health Connect  [phone] - can help you locate a primary care doctor that  accepts your insurance, provides certain services, etc. Physician Referral Service- [phone]  Agencies that provide inexpensive medical care: [facility]  [phone] [facility]  [phone] [facility]  [phone] [facility]  [phone] Planned Parenthood  [phone] [facility]  [phone]  Medicaid-accepting [place] [facility] Providers: [facility]- [address] [place], [address]  [phone], Mon-Fri 9am-7pm, Sat 9am-1pm [facility]- [address] [place], Suite [place]  [phone] [facility]- [address] [place]  [phone] [facility]- [address]  [phone] Renaye Rakers- [address] [place], [address], [phone]  Only accepts [place] patients after they have their name  applied to their card  Self Pay (no insurance) in [facility]: Sickle Cell Patients: Tamara Church, [facility]  [address] [place], [phone] [facility]- [address] [place]  [phone]       [facility] [place]- [address]       -     [facility]- see information above (Speak to [employer] if you do not have insurance)       -  [facility]- [address] [place], [phone]       -  [facility]- [address],  [phone]       -  [facility]- [address] [phone]       -  Tamara Church-  [address], [place], [phone]       -  [facility]- [address], [phone]       -  [facility]- [address], [phone], also [address] [phone]       -    [facility]- [address] [place], [phone], 1st & 3rd Saturday   every month, 10am-1pm  1) Find  a Doctor and Pay Out of Pocket Although you won't have to find out who is covered by your insurance plan, it is a good idea to ask around and get recommendations. You will then need to call the office and see if the doctor you have chosen will accept you as a new patient and what types of options they offer for patients who are self-pay. Some doctors offer discounts or will set up payment plans for their patients who do not have insurance, but you will need to ask so you aren't surprised when you get to your appointment.  2) Contact Your Local Health Department Not all health departments have doctors that can see patients for sick visits, but many do, so it is  worth a call to see if yours does. If you don't know where your local health department is, you can check in your phone book. The CDC also has a tool to help you locate your state's health department, and many state websites also have listings of all of their local health departments.  3) Find a Walk-in Clinic If your illness is not likely to be very severe or complicated, you may want to try a walk in clinic. These are popping up all over the country in pharmacies, drugstores, and shopping centers. They're usually staffed by nurse practitioners or physician assistants that have been trained to treat common illnesses and complaints. They're usually fairly quick and inexpensive. However, if you have serious medical issues or chronic medical problems, these are probably not your best option  STD Testing [facility] [place], STD Clinic, [address], [place], phone [phone] or [phone].  Monday - Friday, call for an appointment. [facility] of [facility] [place], STD Clinic, [place] [place], [place], phone [phone] or [phone].  Monday - Friday, call for an appointment.  Abuse/Neglect: [facility] Child Abuse Hotline (913)487-3487(336) (913)668-0720 [facility] Child Abuse Hotline 682 395 5866(972) 524-3631 (After Hours)  Emergency Shelter:  [facility] (901)017-9373(336) (831)756-1947  Maternity Homes: Room at the [place] [facility] (705) [phone](336) [phone] [facility] [phone](704) [phone]  MRSA Hotline #:   (248)459-1974(743)051-2327  [facility] Resources  [facility] [place]  United Way [facility]. [address]                 [address]         [address]  [place]                                               [place]                              [place] Phone:  [phone]                                  Phone:  [phone]                   Phone:  [phone]  [facility],  [phone] [facility] - [phone]- [phone]       -     [facility] in [place], [address],  [phone], [place] [phone] or [phone] (After Hours)   [place]  Substance Abuse Resources: Alcohol and Drug Services  [place] 205-104-6995 The [facility] Chinita Pester [phone] Residential & Outpatient Substance Abuse Program  [phone]  Psychological Services: [place]  [phone] [place]  [place], [place] [address] [place], [place]: [phone] or [phone], PicCapture.uy  Dental Assistance  If unable to pay or uninsured, contact:  [facility] or [facility]. to become qualified for the adult dental clinic.  Patients with Medicaid: [facility] [phone] [address], [place] [address], [phone]  If unable to pay, or uninsured, contact [facility] [phone]) or [employer] [phone] in [place], [place] in [facility]) to become qualified for the adult dental clinic   Other [place]- [place], [place], [place], [address], [place], [place], 2nd and 4th Thursday of the month at 6:30am.  10 clients each day by appointment, can sometimes see walk-in patients if someone does not show for an appointment. [facility]- [address] [place]ard Danker [place], [place], [address], [place], [place], [place], [address], [place] Department- [phone] [place] [facility] [phone]

## 2018-05-30 ENCOUNTER — Emergency Department (HOSPITAL_COMMUNITY)
Admission: EM | Admit: 2018-05-30 | Discharge: 2018-05-30 | Disposition: A | Discharge status: Home / Self Care | Payer: Self-pay | Attending: Emergency Medicine | Admitting: Emergency Medicine

## 2018-05-30 ENCOUNTER — Other Ambulatory Visit: Payer: Self-pay

## 2018-05-30 DIAGNOSIS — R112 Nausea with vomiting, unspecified: Secondary | ICD-10-CM | POA: Insufficient documentation

## 2018-05-30 DIAGNOSIS — R197 Diarrhea, unspecified: Secondary | ICD-10-CM | POA: Insufficient documentation

## 2018-05-30 DIAGNOSIS — Z79899 Other long term (current) drug therapy: Secondary | ICD-10-CM | POA: Insufficient documentation

## 2018-05-30 DIAGNOSIS — I129 Hypertensive chronic kidney disease with stage 1 through stage 4 chronic kidney disease, or unspecified chronic kidney disease: Secondary | ICD-10-CM | POA: Insufficient documentation

## 2018-05-30 DIAGNOSIS — N182 Chronic kidney disease, stage 2 (mild): Secondary | ICD-10-CM | POA: Insufficient documentation

## 2018-05-30 LAB — COMPREHENSIVE METABOLIC PANEL
ALBUMIN: 4.4 g/dL (ref 3.5–5.0)
ALT: 25 U/L (ref 0–44)
AST: 27 U/L (ref 15–41)
Alkaline Phosphatase: 58 U/L (ref 38–126)
Anion gap: 12 (ref 5–15)
BUN: 10 mg/dL (ref 6–20)
CHLORIDE: 102 mmol/L (ref 98–111)
CO2: 27 mmol/L (ref 22–32)
Calcium: 10.1 mg/dL (ref 8.9–10.3)
Creatinine, Ser: 0.81 mg/dL (ref 0.44–1.00)
GFR calc Af Amer: >60 mL/min (ref >60)
GLUCOSE: 133 mg/dL — AB (ref 70–99)
POTASSIUM: 3.4 mmol/L — AB (ref 3.5–5.1)
Sodium: 141 mmol/L (ref 135–145)
Total Bilirubin: 0.7 mg/dL (ref 0.3–1.2)
Total Protein: 8.7 g/dL — ABNORMAL HIGH (ref 6.5–8.1)

## 2018-05-30 LAB — CBC
HEMATOCRIT: 37.3 % (ref 36.0–46.0)
Hemoglobin: 12.5 g/dL (ref 12.0–15.0)
MCH: 29.3 pg (ref 26.0–34.0)
MCHC: 33.5 g/dL (ref 30.0–36.0)
MCV: 87.6 fL (ref 78.0–100.0)
Platelets: 309 10*3/uL (ref 150–400)
RBC: 4.26 MIL/uL (ref 3.87–5.11)
RDW: 13.9 % (ref 11.5–15.5)
WBC: 9.6 10*3/uL (ref 4.0–10.5)

## 2018-05-30 LAB — URINALYSIS, ROUTINE W REFLEX MICROSCOPIC
BACTERIA UA: NONE SEEN
BILIRUBIN URINE: NEGATIVE
Glucose, UA: NEGATIVE mg/dL
KETONES UR: NEGATIVE mg/dL
LEUKOCYTES UA: NEGATIVE
NITRITE: NEGATIVE
PH: 7 (ref 5.0–8.0)
Protein, ur: NEGATIVE mg/dL
Specific Gravity, Urine: 1.003 — ABNORMAL LOW (ref 1.005–1.030)

## 2018-05-30 LAB — LIPASE, BLOOD: Lipase: 25 U/L (ref 11–51)

## 2018-05-30 LAB — I-STAT BETA HCG BLOOD, ED (MC, WL, AP ONLY): I-stat hCG, quantitative: <5 m[IU]/mL (ref <5)

## 2018-05-30 MED ORDER — ONDANSETRON HCL 4 MG PO TABS: 4.0000 mg | ORAL_TABLET | Freq: Four times a day (QID) | ORAL | 0 refills | Status: DC

## 2018-05-30 MED ORDER — DICYCLOMINE HCL 10 MG/ML IM SOLN
10.0000 mg | Freq: Once | INTRAMUSCULAR | Status: AC
  Administered 2018-05-30: 10 mg via INTRAMUSCULAR
  Filled 2018-05-30: qty 2

## 2018-05-30 MED ORDER — SODIUM CHLORIDE 0.9 % IV BOLUS
1000.0000 mL | Freq: Once | INTRAVENOUS | Status: AC
  Administered 2018-05-30: 1000 mL via INTRAVENOUS

## 2018-05-30 MED ORDER — DICYCLOMINE HCL 20 MG PO TABS: 20.0000 mg | ORAL_TABLET | Freq: Two times a day (BID) | ORAL | 0 refills | Status: DC

## 2018-05-30 MED ORDER — ONDANSETRON HCL 4 MG/2ML IJ SOLN
4.0000 mg | Freq: Once | INTRAMUSCULAR | Status: AC
  Administered 2018-05-30: 4 mg via INTRAVENOUS
  Filled 2018-05-30: qty 2

## 2018-05-30 NOTE — ED Triage Notes (Signed)
Patient arrives with /o N/V/D that started yesterday. +generalized body aches, +chills. Patient states may have been food poisoning, symptoms started after eating. Patient HTN in triage but unable to keep medicines down due to vomiting.

## 2018-05-30 NOTE — Discharge Instructions (Signed)
Take Zofran every 6 hours as needed for nausea or vomiting.  Take Bentyl twice daily as needed for abdominal cramping.  Start with bland foods such as bananas, rice, applesauce, toast and progress your diet as tolerated.  Please follow-up with your doctor for recheck if symptoms are not improving.  Please take your blood pressure medication on returning home.  Patient emergency department if you develop any new or worsening symptoms.

## 2018-05-30 NOTE — ED Provider Notes (Signed)
[facility]-EMERGENCY DEPT Provider Note   CSN: [id number] Arrival date & time: 05/30/18  1641     History   Chief Complaint Chief Complaint  Patient presents with  . Nausea  . Emesis  . Generalized Body Aches  . Abdominal Pain    HPI Tamara Church is a 42 y.o. female with history of hypertension, anemia who presents with nausea, vomiting, diarrhea that began this morning.  Patient reports associated abdominal pain that is worse before having to make a bowel movement.  She reports having chicken around 7 or 8 PM last night.  Her husband has had diarrhea at home as well.  She denies any bloody stools.  She reports lower abdominal pain. She has had associated generalized body aches and chills, but no fevers.  She has been on to keep any fluids down today.  She denies take any medication at home for symptoms.  HPI  Past Medical History:  Diagnosis Date  . Anemia   . Hypertension     Patient Active Problem List   Diagnosis Date Noted  . Laryngitis 10/22/2016  . Scabies 03/23/2012  . CKD (chronic kidney disease) stage 2, GFR 60-89 ml/min 03/21/2012  . Contraceptive management 01/31/2012  . Elevated serum creatinine 01/31/2012  . Encounter for IUD insertion 06/08/2011  . Breast engorgement 04/22/2011  . ANEMIA 09/07/2008  . GERD 09/04/2008  . OBESITY, NOS 11/18/2006  . HYPERTENSION, BENIGN SYSTEMIC 11/18/2006    Past Surgical History:  Procedure Laterality Date  . NO PAST SURGERIES       OB History    Gravida  6   Para  6   Term  6   Preterm  0   AB  0   Living  6     SAB  0   TAB  0   Ectopic  0   Multiple  0   Live Births               Home Medications    Prior to Admission medications   Medication Sig Start Date End Date Taking? Authorizing Provider  hydrochlorothiazide (HYDRODIURIL) 25 MG tablet Take 1 tablet (25 mg total) by mouth daily. 01/17/18  Yes Winfrey, Harlen Labs, MD  levonorgestrel (MIRENA) 20 MCG/24HR IUD  1 each by Intrauterine route once.   Yes [provider]  amLODipine (NORVASC) 10 MG tablet Take 1 tablet (10 mg total) by mouth daily. Patient not taking: Reported on 05/30/2018 12/13/17   Lennox Solders, MD  dicyclomine (BENTYL) 20 MG tablet Take 1 tablet (20 mg total) by mouth 2 (two) times daily. 05/30/18   Ileta Ofarrell, Waylan Boga, PA-C  fluticasone (FLONASE) 50 MCG/ACT nasal spray Place 2 sprays into both nostrils daily. Patient not taking: Reported on 05/30/2018 10/22/16   Arvilla Market, DO  ibuprofen (ADVIL,MOTRIN) 600 MG tablet Take 1 tablet (600 mg total) by mouth every 6 (six) hours as needed. Patient not taking: Reported on 05/30/2018 10/22/16   Arvilla Market, DO  loratadine (CLARITIN) 10 MG tablet Take 1 tablet (10 mg total) by mouth daily. Patient not taking: Reported on 05/30/2018 10/22/16   Arvilla Market, DO  methocarbamol (ROBAXIN) 500 MG tablet Take 1 tablet (500 mg total) by mouth 2 (two) times daily. Patient not taking: Reported on 05/30/2018 04/15/17   Elson Areas, PA-C  omeprazole (PRILOSEC) 20 MG capsule Take 1 capsule (20 mg total) by mouth daily as needed. Patient not taking: Reported on 05/30/2018 10/20/16  Rumley, Dover N, DO  ondansetron (ZOFRAN) 4 MG tablet Take 1 tablet (4 mg total) by mouth every 6 (six) hours. 05/30/18   Novi Calia, Waylan Boga, PA-C  oxymetazoline (AFRIN NASAL SPRAY) 0.05 % nasal spray Place 1 spray into both nostrils 2 (two) times daily. Patient not taking: Reported on 05/30/2018 10/01/16   Audry Pili, PA-C    Family History Family History  Problem Relation Age of Onset  . Hypertension Mother     Social History Social History   Tobacco Use  . Smoking status: Never Smoker  . Smokeless tobacco: Never Used  Substance Use Topics  . Alcohol use: No  . Drug use: No     Allergies   Ace inhibitors   Review of Systems Review of Systems  Constitutional: Negative for chills and fever.  HENT: Negative for facial swelling  and sore throat.   Respiratory: Negative for shortness of breath.   Cardiovascular: Negative for chest pain.  Gastrointestinal: Positive for abdominal pain, diarrhea, nausea and vomiting. Negative for constipation.  Genitourinary: Negative for dysuria.  Musculoskeletal: Negative for back pain.  Skin: Negative for rash and wound.  Neurological: Negative for headaches.  Psychiatric/Behavioral: The patient is not nervous/anxious.      Physical Exam Updated Vital Signs BP (!) 147/88 (BP Location: Right Arm)   Pulse 99   Temp 98.3 F (36.8 C) (Oral)   Resp 18   Ht 5\' 2"  (1.575 m)   Wt 93 kg   LMP 05/23/2018   SpO2 99%   BMI 37.49 kg/m   Physical Exam  Constitutional: She appears well-developed and well-nourished. No distress.  HENT:  Head: Normocephalic and atraumatic.  Mouth/Throat: Oropharynx is clear and moist. No oropharyngeal exudate.  Eyes: Pupils are equal, round, and reactive to light. Conjunctivae are normal. Right eye exhibits no discharge. Left eye exhibits no discharge. No scleral icterus.  Neck: Normal range of motion. Neck supple. No thyromegaly present.  Cardiovascular: Normal rate, regular rhythm, normal heart sounds and intact distal pulses. Exam reveals no gallop and no friction rub.  No murmur heard. Pulmonary/Chest: Effort normal and breath sounds normal. No stridor. No respiratory distress. She has no wheezes. She has no rales.  Abdominal: Soft. Bowel sounds are normal. She exhibits no distension. There is no tenderness. There is no rigidity, no rebound, no guarding and no CVA tenderness.  No abdominal tenderness, although patient reports she has lower abdominal pain it is not worse on palpation  Musculoskeletal: She exhibits no edema.  Lymphadenopathy:    She has no cervical adenopathy.  Neurological: She is alert. Coordination normal.  Skin: Skin is warm and dry. No rash noted. She is not diaphoretic. No pallor.  Psychiatric: She has a normal mood and  affect.  Nursing note and vitals reviewed.    ED Treatments / Results  Labs (all labs ordered are listed, but only abnormal results are displayed) Labs Reviewed  COMPREHENSIVE METABOLIC PANEL - Abnormal; Notable for the following components:      Result Value   Potassium 3.4 (*)    Glucose, Bld 133 (*)    Total Protein 8.7 (*)    All other components within normal limits  URINALYSIS, ROUTINE W REFLEX MICROSCOPIC - Abnormal; Notable for the following components:   Color, Urine STRAW (*)    Specific Gravity, Urine 1.003 (*)    Hgb urine dipstick SMALL (*)    All other components within normal limits  LIPASE, BLOOD  CBC  I-STAT BETA HCG BLOOD, ED (  [facility], [facility], [facility] ONLY)    EKG None  Radiology No results found.  Procedures Procedures (including critical care time)  Medications Ordered in ED Medications  sodium chloride 0.9 % bolus 1,000 mL (0 mLs Intravenous Stopped 05/30/18 2121)  ondansetron (ZOFRAN) injection 4 mg (4 mg Intravenous Given 05/30/18 1930)  dicyclomine (BENTYL) injection 10 mg (10 mg Intramuscular Given 05/30/18 1931)     Initial Impression / Assessment and Plan / ED Course  I have reviewed the triage vital signs and the nursing notes.  Pertinent labs & imaging results that were available during my care of the patient were reviewed by me and considered in my medical decision making (see chart for details).     Patient presenting with probable food related gastroenteritis.  Patient ate chicken last night and a few hours later began with symptoms.  Her husband has similar symptoms.  Labs are unremarkable.  Abdomen is soft and nontender.  No indication for imaging at this time.  Patient is feeling much improved after Zofran and Bentyl.  Also given fluids.  She is tolerating oral fluids.  Will discharge home with Zofran and Bentyl.  Patient advised to resume taking her blood pressure medication, as it was elevated when she arrived, however decreased to 127/80 prior to  discharge.  Return precautions discussed.  Patient understands and agrees with plan.  Patient vitals stable throughout ED course and discharged in satisfactory condition.  Final Clinical Impressions(s) / ED Diagnoses   Final diagnoses:  Nausea vomiting and diarrhea    ED Discharge Orders         Ordered    dicyclomine (BENTYL) 20 MG tablet  2 times daily     05/30/18 2112    ondansetron (ZOFRAN) 4 MG tablet  Every 6 hours     05/30/18 2112           Emi Holes, PA-C 05/30/18 2212    Sabas Sous, MD 05/31/18 [phone]

## 2018-09-22 ENCOUNTER — Encounter (HOSPITAL_COMMUNITY): Payer: Self-pay

## 2018-09-22 ENCOUNTER — Emergency Department (HOSPITAL_COMMUNITY)
Admission: EM | Admit: 2018-09-22 | Discharge: 2018-09-22 | Disposition: A | Discharge status: Home / Self Care | Payer: Self-pay | Attending: Emergency Medicine | Admitting: Emergency Medicine

## 2018-09-22 ENCOUNTER — Other Ambulatory Visit: Payer: Self-pay

## 2018-09-22 DIAGNOSIS — N182 Chronic kidney disease, stage 2 (mild): Secondary | ICD-10-CM | POA: Insufficient documentation

## 2018-09-22 DIAGNOSIS — S025XXA Fracture of tooth (traumatic), initial encounter for closed fracture: Secondary | ICD-10-CM | POA: Insufficient documentation

## 2018-09-22 DIAGNOSIS — K0889 Other specified disorders of teeth and supporting structures: Secondary | ICD-10-CM

## 2018-09-22 DIAGNOSIS — Y939 Activity, unspecified: Secondary | ICD-10-CM | POA: Insufficient documentation

## 2018-09-22 DIAGNOSIS — Z79899 Other long term (current) drug therapy: Secondary | ICD-10-CM | POA: Insufficient documentation

## 2018-09-22 DIAGNOSIS — Y998 Other external cause status: Secondary | ICD-10-CM | POA: Insufficient documentation

## 2018-09-22 DIAGNOSIS — I129 Hypertensive chronic kidney disease with stage 1 through stage 4 chronic kidney disease, or unspecified chronic kidney disease: Secondary | ICD-10-CM | POA: Insufficient documentation

## 2018-09-22 DIAGNOSIS — Y929 Unspecified place or not applicable: Secondary | ICD-10-CM | POA: Insufficient documentation

## 2018-09-22 DIAGNOSIS — X58XXXA Exposure to other specified factors, initial encounter: Secondary | ICD-10-CM | POA: Insufficient documentation

## 2018-09-22 MED ORDER — AMOXICILLIN-POT CLAVULANATE 875-125 MG PO TABS: 1.0000 | ORAL_TABLET | Freq: Two times a day (BID) | ORAL | 0 refills | Status: AC

## 2018-09-22 NOTE — Discharge Instructions (Addendum)
You were given a prescription for antibiotics. Please take the antibiotic prescription fully.   Please follow-up with a dentist in the next 5 to 7 days for reevaluation.  If you do not have a dentist, resources were provided for dentist in the area in your discharge summary.  Please contact one of the offices that are listed and make an appointment for follow-up.  Please return to the emergency department for any new or worsening symptoms.  

## 2018-09-22 NOTE — ED Provider Notes (Signed)
[facility]-EMERGENCY DEPT Provider Note   CSN: [id number] Arrival date & time: 09/22/18  1951     History   Chief Complaint Chief Complaint  Patient presents with  . Dental Pain    HPI Tamara Church is a 43 y.o. female.  HPI   Patient is a 44 year old female with a history of anemia, hypertension, who presents the emergency department today for evaluation of left lower dental pain that began yesterday.  States she has a broken tooth in that area.  Describes it as a throbbing pain that is constant and severe in nature.  Rates pain 8/10.  She tried taking ibuprofen yesterday which helped ease off the pain but it did not resolve pain today.  She has had no fevers or chills.  No other URI symptoms.  She is been able to eat and drink at home without difficulty.  Past Medical History:  Diagnosis Date  . Anemia   . Hypertension     Patient Active Problem List   Diagnosis Date Noted  . Laryngitis 10/22/2016  . Scabies 03/23/2012  . CKD (chronic kidney disease) stage 2, GFR 60-89 ml/min 03/21/2012  . Contraceptive management 01/31/2012  . Elevated serum creatinine 01/31/2012  . Encounter for IUD insertion 06/08/2011  . Breast engorgement 04/22/2011  . ANEMIA 09/07/2008  . GERD 09/04/2008  . OBESITY, NOS 11/18/2006  . HYPERTENSION, BENIGN SYSTEMIC 11/18/2006    Past Surgical History:  Procedure Laterality Date  . NO PAST SURGERIES       OB History    Gravida  6   Para  6   Term  6   Preterm  0   AB  0   Living  6     SAB  0   TAB  0   Ectopic  0   Multiple  0   Live Births               Home Medications    Prior to Admission medications   Medication Sig Start Date End Date Taking? Authorizing Provider  amLODipine (NORVASC) 10 MG tablet Take 1 tablet (10 mg total) by mouth daily. Patient not taking: Reported on 05/30/2018 12/13/17   Lennox Solders, MD  amoxicillin-clavulanate (AUGMENTIN) 875-125 MG tablet Take 1 tablet by  mouth every 12 (twelve) hours for 7 days. 09/22/18 09/29/18  Yerachmiel Spinney S, PA-C  dicyclomine (BENTYL) 20 MG tablet Take 1 tablet (20 mg total) by mouth 2 (two) times daily. 05/30/18   Law, Waylan Boga, PA-C  fluticasone (FLONASE) 50 MCG/ACT nasal spray Place 2 sprays into both nostrils daily. Patient not taking: Reported on 05/30/2018 10/22/16   Arvilla Market, DO  hydrochlorothiazide (HYDRODIURIL) 25 MG tablet Take 1 tablet (25 mg total) by mouth daily. 01/17/18   Lennox Solders, MD  ibuprofen (ADVIL,MOTRIN) 600 MG tablet Take 1 tablet (600 mg total) by mouth every 6 (six) hours as needed. Patient not taking: Reported on 05/30/2018 10/22/16   Arvilla Market, DO  levonorgestrel [facility]) 20 MCG/24HR IUD 1 each by Intrauterine route once.    [provider]  loratadine (CLARITIN) 10 MG tablet Take 1 tablet (10 mg total) by mouth daily. Patient not taking: Reported on 05/30/2018 10/22/16   Arvilla Market, DO  methocarbamol (ROBAXIN) 500 MG tablet Take 1 tablet (500 mg total) by mouth 2 (two) times daily. Patient not taking: Reported on 05/30/2018 04/15/17   Elson Areas, PA-C  omeprazole (PRILOSEC) 20 MG capsule  Take 1 capsule (20 mg total) by mouth daily as needed. Patient not taking: Reported on 05/30/2018 10/20/16   Araceli Bouche, DO  ondansetron (ZOFRAN) 4 MG tablet Take 1 tablet (4 mg total) by mouth every 6 (six) hours. 05/30/18   Law, Waylan Boga, PA-C  oxymetazoline (AFRIN NASAL SPRAY) 0.05 % nasal spray Place 1 spray into both nostrils 2 (two) times daily. Patient not taking: Reported on 05/30/2018 10/01/16   Audry Pili, PA-C    Family History Family History  Problem Relation Age of Onset  . Hypertension Mother     Social History Social History   Tobacco Use  . Smoking status: Never Smoker  . Smokeless tobacco: Never Used  Substance Use Topics  . Alcohol use: No  . Drug use: No     Allergies   Ace inhibitors   Review of Systems Review of  Systems  Constitutional: Negative for fever.  HENT: Positive for dental problem.   Respiratory: Negative for cough.      Physical Exam Updated Vital Signs BP 135/90 (BP Location: Right Arm)   Pulse 91   Temp 98.7 F (37.1 C) (Oral)   Resp 20   LMP 08/25/2018   SpO2 97%   Physical Exam Vitals signs and nursing note reviewed.  Constitutional:      General: She is not in acute distress.    Appearance: She is well-developed.  HENT:     Head: Normocephalic and atraumatic.     Nose: Nose normal.     Mouth/Throat:     Mouth: Mucous membranes are moist.     Pharynx: No oropharyngeal exudate or posterior oropharyngeal erythema.     Comments: Diffuse dental caries, multiple missing teeth.  Tooth #18 is fractured and tender to percussion.  No associated abscess present.  No swelling beneath the tongue. Eyes:     Conjunctiva/sclera: Conjunctivae normal.  Neck:     Musculoskeletal: Neck supple.  Cardiovascular:     Rate and Rhythm: Normal rate.  Pulmonary:     Effort: Pulmonary effort is normal.  Musculoskeletal: Normal range of motion.  Lymphadenopathy:     Cervical: No cervical adenopathy.  Skin:    General: Skin is warm and dry.  Neurological:     Mental Status: She is alert.    ED Treatments / Results  Labs (all labs ordered are listed, but only abnormal results are displayed) Labs Reviewed - No data to display  EKG None  Radiology No results found.  Procedures Procedures (including critical care time)  Medications Ordered in ED Medications - No data to display   Initial Impression / Assessment and Plan / ED Course  I have reviewed the triage vital signs and the nursing notes.  Pertinent labs & imaging results that were available during my care of the patient were reviewed by me and considered in my medical decision making (see chart for details).     Final Clinical Impressions(s) / ED Diagnoses   Final diagnoses:  Pain, dental  Closed fracture of  tooth, initial encounter   Patient with toothache.  No gross abscess.  Exam unconcerning for Ludwig's angina or spread of infection.  Will treat with augmenting and pain medicine.  Urged patient to follow-up with dentist.  Resources given. Advised to return if worse. All questions answered. Pt stable for discharge.   ED Discharge Orders         Ordered    amoxicillin-clavulanate (AUGMENTIN) 875-125 MG tablet  Every 12 hours  09/22/18 2042           Karrie MeresCouture, Elgar Scoggins S, PA-C 09/22/18 2116    Charlynne PanderYao, David Hsienta, MD 09/22/18 2225

## 2018-09-22 NOTE — ED Triage Notes (Signed)
Pt reports 8/10 throbbing left sided lower back molar  pain. Pt reports that some of tooth filling has came out.

## 2018-11-02 ENCOUNTER — Encounter (HOSPITAL_COMMUNITY): Payer: Self-pay | Admitting: *Deleted

## 2018-11-02 ENCOUNTER — Ambulatory Visit (HOSPITAL_COMMUNITY)
Admission: EM | Admit: 2018-11-02 | Discharge: 2018-11-02 | Disposition: A | Discharge status: Home / Self Care | Payer: Self-pay | Attending: Internal Medicine | Admitting: Internal Medicine

## 2018-11-02 ENCOUNTER — Encounter (HOSPITAL_COMMUNITY): Payer: Self-pay

## 2018-11-02 ENCOUNTER — Emergency Department (HOSPITAL_COMMUNITY): Admission: EM | Admit: 2018-11-02 | Discharge: 2018-11-02 | Discharge status: Against Medical Advice | Payer: Self-pay

## 2018-11-02 DIAGNOSIS — R6889 Other general symptoms and signs: Secondary | ICD-10-CM

## 2018-11-02 LAB — POCT RAPID STREP A: Streptococcus, Group A Screen (Direct): NEGATIVE

## 2018-11-02 MED ORDER — OSELTAMIVIR PHOSPHATE 75 MG PO CAPS: 75.0000 mg | ORAL_CAPSULE | Freq: Two times a day (BID) | ORAL | 0 refills | Status: DC

## 2018-11-02 NOTE — Discharge Instructions (Signed)
Strep negative.  Will send out to culture.   Get plenty of rest and push fluids.  Drink at least half your body weight in ounces.  You may supplement with OTC Pedialyte or oral rehydration solution Tamiflu prescribed.  Take as directed and to completion Use OTC tylenol or ibuprofen every 4 hours for fever Follow up with PCP if symptoms persist Go to the ED if you have any new or worsening symptoms fever that does not moderate with tylenol, chills, nausea, vomiting, chest pain, worsening cough, shortness of breath, wheezing, abdominal pain, changes in bowel or bladder habits, etc..Marland Kitchen

## 2018-11-02 NOTE — ED Provider Notes (Signed)
[facility]   [id number] 11/02/18 Arrival Time: 1902   CC: URI symptoms   SUBJECTIVE: History from: patient.  Tamara Church is a 43 y.o. female who presents with abrupt onset of sore throat, body aches, fatigue, subjective fever and chills that began this morning.  Denies known sick exposure or precipitating event.  Has tried OTC medications without relief.  Symptoms are made worse with swallowing, but tolerating own secretions.  Reports previous symptoms in the past and diagnosed with the flu.   Denies sinus pain, rhinorrhea, SOB, wheezing, chest pain, changes in bowel or bladder habits.    Received flu shot this year: no.  ROS: As per HPI.  Past Medical History:  Diagnosis Date  . Anemia   . Hypertension    Past Surgical History:  Procedure Laterality Date  . NO PAST SURGERIES     Allergies  Allergen Reactions  . Ace Inhibitors Cough   Current Facility-Administered Medications on File Prior to Encounter  Medication Dose Route Frequency Provider Last Rate Last Dose  . medroxyPROGESTERone (DEPO-PROVERA) injection 150 mg  150 mg Intramuscular Once Winfrey, Harlen Labs, MD       Current Outpatient Medications on File Prior to Encounter  Medication Sig Dispense Refill  . dicyclomine (BENTYL) 20 MG tablet Take 1 tablet (20 mg total) by mouth 2 (two) times daily. 20 tablet 0  . hydrochlorothiazide (HYDRODIURIL) 25 MG tablet Take 1 tablet (25 mg total) by mouth daily. 90 tablet 3  . levonorgestrel (MIRENA) 20 MCG/24HR IUD 1 each by Intrauterine route once.    . ondansetron (ZOFRAN) 4 MG tablet Take 1 tablet (4 mg total) by mouth every 6 (six) hours. 12 tablet 0   Social History   Socioeconomic History  . Marital status: Single    Spouse name: Not on file  . Number of children: Not on file  . Years of education: Not on file  . Highest education level: Not on file  Occupational History  . Not on file  Social Needs  . Financial resource strain: Not on file  .  Food insecurity:    Worry: Not on file    Inability: Not on file  . Transportation needs:    Medical: Not on file    Non-medical: Not on file  Tobacco Use  . Smoking status: Never Smoker  . Smokeless tobacco: Never Used  Substance and Sexual Activity  . Alcohol use: No  . Drug use: No  . Sexual activity: Yes    Birth control/protection: None, I.U.D.    Comment: pt would like to start depo provera today  Lifestyle  . Physical activity:    Days per week: Not on file    Minutes per session: Not on file  . Stress: Not on file  Relationships  . Social connections:    Talks on phone: Not on file    Gets together: Not on file    Attends religious service: Not on file    Active member of club or organization: Not on file    Attends meetings of clubs or organizations: Not on file    Relationship status: Not on file  . Intimate partner violence:    Fear of current or ex partner: Not on file    Emotionally abused: Not on file    Physically abused: Not on file    Forced sexual activity: Not on file  Other Topics Concern  . Not on file  Social History Narrative  . Not on  file   Family History  Problem Relation Age of Onset  . Hypertension Mother     OBJECTIVE:  Vitals:   11/02/18 1951  BP: (!) 144/88  Pulse: (!) 130  Resp: 20  Temp: 98.9 F (37.2 C)  TempSrc: Oral  SpO2: 100%     General appearance: alert; appears fatigued, but nontoxic; speaking in full sentences and tolerating own secretions HEENT: NCAT; Ears: EACs clear, TMs pearly gray; Eyes: PERRL.  EOM grossly intact. Nose: nares patent without rhinorrhea, turbinates swollen and erythematous; Throat: oropharynx clear, tonsils erythematous and enlarged with white exudates, uvula midline  Neck: supple without LAD Lungs: unlabored respirations, symmetrical air entry; cough: mild; no respiratory distress; CTAB Heart: Tachycardic; Radial pulses 2+ symmetrical bilaterally; 110 bpm Skin: warm and dry Psychological:  alert and cooperative; normal mood and affect  LABS:  Results for orders placed or performed during the hospital encounter of 11/02/18 (from the past 24 hour(s))  POCT rapid strep A [facility])     Status: None   Collection Time: 11/02/18  8:47 PM  Result Value Ref Range   Streptococcus, Group A Screen (Direct) NEGATIVE NEGATIVE     ASSESSMENT & PLAN:  1. Flu-like symptoms     Meds ordered this encounter  Medications  . oseltamivir (TAMIFLU) 75 MG capsule    Sig: Take 1 capsule (75 mg total) by mouth every 12 (twelve) hours.    Dispense:  10 capsule    Refill:  0    Order Specific Question:   Supervising Provider    Answer:   Eustace MooreELSON, YVONNE SUE [[id number]]   Strep negative.  Will send out to culture.   Get plenty of rest and push fluids.  Drink at least half your body weight in ounces.  You may supplement with OTC Pedialyte or oral rehydration solution Tamiflu prescribed.  Take as directed and to completion Use OTC tylenol or ibuprofen every 4 hours for fever Follow up with PCP if symptoms persist Go to the ED if you have any new or worsening symptoms fever that does not moderate with tylenol, chills, nausea, vomiting, chest pain, worsening cough, shortness of breath, wheezing, abdominal pain, changes in bowel or bladder habits, etc...   Reviewed expectations re: course of current medical issues. Questions answered. Outlined signs and symptoms indicating need for more acute intervention. Patient verbalized understanding. After Visit Summary given.         Rennis HardingWurst, Jannetta Massey, PA-C 11/02/18 2332

## 2018-11-02 NOTE — ED Triage Notes (Signed)
Pt in c/o body aches and sore throat since this am, unsure of fever

## 2018-11-02 NOTE — ED Triage Notes (Signed)
Pt presents with sore throat, headache, and generalized body aches.

## 2018-11-02 NOTE — ED Notes (Signed)
Pt left without telling anyone she was leaving. Urgent Care called and asked to have her d/c'd.

## 2018-11-05 LAB — CULTURE, GROUP A STREP (THRC)

## 2019-02-09 ENCOUNTER — Other Ambulatory Visit: Payer: Self-pay | Admitting: Family Medicine

## 2019-04-03 ENCOUNTER — Emergency Department (HOSPITAL_COMMUNITY): Payer: Medicaid Other

## 2019-04-03 ENCOUNTER — Inpatient Hospital Stay (HOSPITAL_COMMUNITY)
Admission: EM | Admit: 2019-04-03 | Discharge: 2019-04-05 | DRG: 179 | Disposition: A | Discharge status: Home / Self Care | Payer: Medicaid Other | Attending: Internal Medicine | Admitting: Internal Medicine

## 2019-04-03 DIAGNOSIS — K219 Gastro-esophageal reflux disease without esophagitis: Secondary | ICD-10-CM | POA: Diagnosis present

## 2019-04-03 DIAGNOSIS — I129 Hypertensive chronic kidney disease with stage 1 through stage 4 chronic kidney disease, or unspecified chronic kidney disease: Secondary | ICD-10-CM | POA: Diagnosis present

## 2019-04-03 DIAGNOSIS — R0902 Hypoxemia: Secondary | ICD-10-CM | POA: Diagnosis not present

## 2019-04-03 DIAGNOSIS — Z8249 Family history of ischemic heart disease and other diseases of the circulatory system: Secondary | ICD-10-CM | POA: Diagnosis not present

## 2019-04-03 DIAGNOSIS — Z6832 Body mass index (BMI) 32.0-32.9, adult: Secondary | ICD-10-CM | POA: Diagnosis not present

## 2019-04-03 DIAGNOSIS — E669 Obesity, unspecified: Secondary | ICD-10-CM | POA: Diagnosis present

## 2019-04-03 DIAGNOSIS — N182 Chronic kidney disease, stage 2 (mild): Secondary | ICD-10-CM | POA: Diagnosis not present

## 2019-04-03 DIAGNOSIS — U071 COVID-19: Secondary | ICD-10-CM | POA: Diagnosis not present

## 2019-04-03 DIAGNOSIS — Z888 Allergy status to other drugs, medicaments and biological substances status: Secondary | ICD-10-CM | POA: Diagnosis not present

## 2019-04-03 DIAGNOSIS — Z975 Presence of (intrauterine) contraceptive device: Secondary | ICD-10-CM | POA: Diagnosis not present

## 2019-04-03 DIAGNOSIS — Z79899 Other long term (current) drug therapy: Secondary | ICD-10-CM | POA: Diagnosis not present

## 2019-04-03 DIAGNOSIS — E876 Hypokalemia: Secondary | ICD-10-CM | POA: Diagnosis not present

## 2019-04-03 DIAGNOSIS — R739 Hyperglycemia, unspecified: Secondary | ICD-10-CM | POA: Diagnosis not present

## 2019-04-03 DIAGNOSIS — R0602 Shortness of breath: Secondary | ICD-10-CM | POA: Diagnosis not present

## 2019-04-03 DIAGNOSIS — I10 Essential (primary) hypertension: Secondary | ICD-10-CM | POA: Diagnosis present

## 2019-04-03 LAB — PROCALCITONIN: Procalcitonin: <0.1 ng/mL

## 2019-04-03 LAB — COMPREHENSIVE METABOLIC PANEL
ALT: 27 U/L (ref 0–44)
AST: 25 U/L (ref 15–41)
Albumin: 3.8 g/dL (ref 3.5–5.0)
Alkaline Phosphatase: 43 U/L (ref 38–126)
Anion gap: 13 (ref 5–15)
BUN: 6 mg/dL (ref 6–20)
CO2: 26 mmol/L (ref 22–32)
Calcium: 9.3 mg/dL (ref 8.9–10.3)
Chloride: 96 mmol/L — ABNORMAL LOW (ref 98–111)
Creatinine, Ser: 0.89 mg/dL (ref 0.44–1.00)
GFR calc Af Amer: >60 mL/min (ref >60)
GFR calc non Af Amer: >60 mL/min (ref >60)
Glucose, Bld: 131 mg/dL — ABNORMAL HIGH (ref 70–99)
Potassium: 2.5 mmol/L — CL (ref 3.5–5.1)
Sodium: 135 mmol/L (ref 135–145)
Total Bilirubin: 0.4 mg/dL (ref 0.3–1.2)
Total Protein: 8.3 g/dL — ABNORMAL HIGH (ref 6.5–8.1)

## 2019-04-03 LAB — FERRITIN: Ferritin: 103 ng/mL (ref 11–307)

## 2019-04-03 LAB — CBC WITH DIFFERENTIAL/PLATELET
Abs Immature Granulocytes: 0.01 10*3/uL (ref 0.00–0.07)
Basophils Absolute: 0 10*3/uL (ref 0.0–0.1)
Basophils Relative: 1 %
Eosinophils Absolute: 0 10*3/uL (ref 0.0–0.5)
Eosinophils Relative: 0 %
HCT: 39.1 % (ref 36.0–46.0)
Hemoglobin: 13 g/dL (ref 12.0–15.0)
Immature Granulocytes: 0 %
Lymphocytes Relative: 34 %
Lymphs Abs: 1.3 10*3/uL (ref 0.7–4.0)
MCH: 28.8 pg (ref 26.0–34.0)
MCHC: 33.2 g/dL (ref 30.0–36.0)
MCV: 86.7 fL (ref 80.0–100.0)
Monocytes Absolute: 0.5 10*3/uL (ref 0.1–1.0)
Monocytes Relative: 12 %
Neutro Abs: 2 10*3/uL (ref 1.7–7.7)
Neutrophils Relative %: 53 %
Platelets: 224 10*3/uL (ref 150–400)
RBC: 4.51 MIL/uL (ref 3.87–5.11)
RDW: 13.3 % (ref 11.5–15.5)
WBC: 3.8 10*3/uL — ABNORMAL LOW (ref 4.0–10.5)
nRBC: 0 % (ref 0.0–0.2)

## 2019-04-03 LAB — CBG MONITORING, ED: Glucose-Capillary: 115 mg/dL — ABNORMAL HIGH (ref 70–99)

## 2019-04-03 LAB — C-REACTIVE PROTEIN: CRP: 1 mg/dL — ABNORMAL HIGH (ref <1.0)

## 2019-04-03 LAB — MAGNESIUM: Magnesium: 2.2 mg/dL (ref 1.7–2.4)

## 2019-04-03 LAB — URINALYSIS, ROUTINE W REFLEX MICROSCOPIC
Bilirubin Urine: NEGATIVE
Glucose, UA: NEGATIVE mg/dL
Hgb urine dipstick: NEGATIVE
Ketones, ur: NEGATIVE mg/dL
Leukocytes,Ua: NEGATIVE
Nitrite: NEGATIVE
Protein, ur: NEGATIVE mg/dL
Specific Gravity, Urine: 1.006 (ref 1.005–1.030)
pH: 7 (ref 5.0–8.0)

## 2019-04-03 LAB — I-STAT BETA HCG BLOOD, ED (MC, WL, AP ONLY): I-stat hCG, quantitative: <5 m[IU]/mL (ref <5)

## 2019-04-03 LAB — D-DIMER, QUANTITATIVE: D-Dimer, Quant: 0.32 ug/mL-FEU (ref 0.00–0.50)

## 2019-04-03 LAB — HEMOGLOBIN A1C
Hgb A1c MFr Bld: 6.2 % — ABNORMAL HIGH (ref 4.8–5.6)
Mean Plasma Glucose: 131.24 mg/dL

## 2019-04-03 LAB — SARS CORONAVIRUS 2 BY RT PCR (HOSPITAL ORDER, PERFORMED IN ~~LOC~~ HOSPITAL LAB): SARS Coronavirus 2: POSITIVE — AB

## 2019-04-03 LAB — LACTATE DEHYDROGENASE: LDH: 182 U/L (ref 98–192)

## 2019-04-03 LAB — BRAIN NATRIURETIC PEPTIDE: B Natriuretic Peptide: 18.2 pg/mL (ref 0.0–100.0)

## 2019-04-03 LAB — FIBRINOGEN: Fibrinogen: 387 mg/dL (ref 210–475)

## 2019-04-03 MED ORDER — FAMOTIDINE IN NACL 20-0.9 MG/50ML-% IV SOLN
20.0000 mg | Freq: Once | INTRAVENOUS | Status: AC
  Administered 2019-04-03: 12:00:00 20 mg via INTRAVENOUS
  Filled 2019-04-03: qty 50

## 2019-04-03 MED ORDER — ONDANSETRON HCL 4 MG/2ML IJ SOLN: 4.0000 mg | Freq: Four times a day (QID) | INTRAMUSCULAR | Status: DC | PRN

## 2019-04-03 MED ORDER — POTASSIUM CHLORIDE CRYS ER 20 MEQ PO TBCR
40.0000 meq | EXTENDED_RELEASE_TABLET | Freq: Once | ORAL | Status: AC
  Administered 2019-04-03: 40 meq via ORAL
  Filled 2019-04-03: qty 2

## 2019-04-03 MED ORDER — POLYETHYLENE GLYCOL 3350 17 G PO PACK: 17.0000 g | PACK | Freq: Every day | ORAL | Status: DC | PRN

## 2019-04-03 MED ORDER — AMLODIPINE BESYLATE 5 MG PO TABS
10.0000 mg | ORAL_TABLET | Freq: Every day | ORAL | Status: DC
  Administered 2019-04-04 (×2): 10 mg via ORAL
  Filled 2019-04-03 (×2): qty 2

## 2019-04-03 MED ORDER — LIDOCAINE VISCOUS HCL 2 % MT SOLN
15.0000 mL | Freq: Once | OROMUCOSAL | Status: AC
  Administered 2019-04-03: 13:00:00 15 mL via ORAL
  Filled 2019-04-03: qty 15

## 2019-04-03 MED ORDER — ONDANSETRON HCL 4 MG PO TABS: 4.0000 mg | ORAL_TABLET | Freq: Four times a day (QID) | ORAL | Status: DC | PRN

## 2019-04-03 MED ORDER — DOCUSATE SODIUM 100 MG PO CAPS
100.0000 mg | ORAL_CAPSULE | Freq: Two times a day (BID) | ORAL | Status: DC
  Administered 2019-04-04 (×2): 100 mg via ORAL
  Filled 2019-04-03 (×4): qty 1

## 2019-04-03 MED ORDER — ENOXAPARIN SODIUM 40 MG/0.4ML ~~LOC~~ SOLN
40.0000 mg | SUBCUTANEOUS | Status: DC
  Administered 2019-04-04 – 2019-04-05 (×2): 40 mg via SUBCUTANEOUS
  Filled 2019-04-03 (×2): qty 0.4

## 2019-04-03 MED ORDER — OXYCODONE HCL 5 MG PO TABS: 5.0000 mg | ORAL_TABLET | ORAL | Status: DC | PRN

## 2019-04-03 MED ORDER — POTASSIUM CHLORIDE CRYS ER 20 MEQ PO TBCR
40.0000 meq | EXTENDED_RELEASE_TABLET | Freq: Once | ORAL | Status: AC
  Administered 2019-04-04: 02:00:00 40 meq via ORAL
  Filled 2019-04-03: qty 2

## 2019-04-03 MED ORDER — METHYLPREDNISOLONE SODIUM SUCC 40 MG IJ SOLR
40.0000 mg | Freq: Two times a day (BID) | INTRAMUSCULAR | Status: DC
  Administered 2019-04-03 – 2019-04-05 (×4): 40 mg via INTRAVENOUS
  Filled 2019-04-03 (×4): qty 1

## 2019-04-03 MED ORDER — ACETAMINOPHEN 325 MG PO TABS
650.0000 mg | ORAL_TABLET | Freq: Four times a day (QID) | ORAL | Status: DC | PRN
  Administered 2019-04-03: 18:00:00 650 mg via ORAL
  Filled 2019-04-03: qty 2

## 2019-04-03 MED ORDER — BISACODYL 5 MG PO TBEC: 5.0000 mg | DELAYED_RELEASE_TABLET | Freq: Every day | ORAL | Status: DC | PRN

## 2019-04-03 MED ORDER — ALUM & MAG HYDROXIDE-SIMETH 200-200-20 MG/5ML PO SUSP
30.0000 mL | Freq: Once | ORAL | Status: AC
  Administered 2019-04-03: 13:00:00 30 mL via ORAL
  Filled 2019-04-03: qty 30

## 2019-04-03 MED ORDER — SODIUM CHLORIDE 0.9 % IV BOLUS
1000.0000 mL | Freq: Once | INTRAVENOUS | Status: AC
  Administered 2019-04-03: 1000 mL via INTRAVENOUS

## 2019-04-03 MED ORDER — POTASSIUM CHLORIDE 10 MEQ/100ML IV SOLN
10.0000 meq | INTRAVENOUS | Status: AC
  Administered 2019-04-03 (×3): 10 meq via INTRAVENOUS
  Filled 2019-04-03 (×3): qty 100

## 2019-04-03 MED ORDER — PANTOPRAZOLE SODIUM 40 MG PO TBEC
40.0000 mg | DELAYED_RELEASE_TABLET | Freq: Every day | ORAL | Status: DC
  Administered 2019-04-04 – 2019-04-05 (×2): 40 mg via ORAL
  Filled 2019-04-03 (×2): qty 1

## 2019-04-03 NOTE — ED Notes (Signed)
ED TO INPATIENT HANDOFF REPORT  ED Nurse Name and Phone #: Lanora Manislizabeth [phone]  S Name/Age/Gender Tamara Church 43 y.o. female Room/Bed: 039C/039C  Code Status   Code Status: Full Code  Home/SNF/Other Home Patient oriented to: self, place, time and situation Is this baseline? Yes      Chief Complaint sob  Triage Note No notes on file   Allergies Allergies  Allergen Reactions  . Ace Inhibitors Cough    Level of Care/Admitting Diagnosis ED Disposition    ED Disposition Condition Comment   Admit  Hospital Area[facility] [[id number]]  Level of Care: Telemetry [5]  Covid Evaluation: Confirmed COVID Positive  Diagnosis: COVID-19 virus infection [[id number]][[phone]]  Admitting Physician: Jonah BlueYATES, JENNIFER [[id number]]  Attending Physician: Jonah BlueYATES, JENNIFER [[id number]]  Estimated length of stay: 3 - 4 days  Certification:: I certify this patient will need inpatient services for at least 2 midnights  PT Class (Do Not Modify): Inpatient [101]  PT Acc Code (Do Not Modify): Private [1]       B Medical/Surgery History Past Medical History:  Diagnosis Date  . Anemia   . Hypertension    Past Surgical History:  Procedure Laterality Date  . NO PAST SURGERIES       A IV Location/Drains/Wounds Patient Lines/Drains/Airways Status   Active Line/Drains/Airways    Name:   Placement date:   Placement time:   Site:   Days:   Peripheral IV 04/03/19 Right Antecubital   04/03/19    1157    Antecubital   less than 1          Intake/Output Last 24 hours  Intake/Output Summary (Last 24 hours) at 04/03/2019 1754 Last data filed at 04/03/2019 1630 Gross per 24 hour  Intake 1152.82 ml  Output -  Net 1152.82 ml    Labs/Imaging Results for orders placed or performed during the hospital encounter of 04/03/19 (from the past 48 hour(s))  Comprehensive metabolic panel     Status: Abnormal   Collection Time: 04/03/19 11:27 AM  Result Value Ref Range   Sodium 135 135 - 145  mmol/L   Potassium 2.5 (LL) 3.5 - 5.1 mmol/L    Comment: CRITICAL RESULT CALLED TO, READ BACK BY AND VERIFIED WITH: Kanya Potteiger,E RN @ 1216 04/03/19 LEONARD,A    Chloride 96 (L) 98 - 111 mmol/L   CO2 26 22 - 32 mmol/L   Glucose, Bld 131 (H) 70 - 99 mg/dL   BUN 6 6 - 20 mg/dL   Creatinine, Ser 4.780.89 0.44 - 1.00 mg/dL   Calcium 9.3 8.9 - 29.510.3 mg/dL   Total Protein 8.3 (H) 6.5 - 8.1 g/dL   Albumin 3.8 3.5 - 5.0 g/dL   AST 25 15 - 41 U/L   ALT 27 0 - 44 U/L   Alkaline Phosphatase 43 38 - 126 U/L   Total Bilirubin 0.4 0.3 - 1.2 mg/dL   GFR calc non Af Amer >60 >60 mL/min   GFR calc Af Amer >60 >60 mL/min   Anion gap 13 5 - 15    Comment: Performed at [facility], [address] [place], [place] [address]  CBC with Differential     Status: Abnormal   Collection Time: 04/03/19 11:27 AM  Result Value Ref Range   WBC 3.8 (L) 4.0 - 10.5 K/uL   RBC 4.51 3.87 - 5.11 MIL/uL   Hemoglobin 13.0 12.0 - 15.0 g/dL   HCT 86.539.1 78.436.0 - 69.646.0 %   MCV  86.7 80.0 - 100.0 fL   MCH 28.8 26.0 - 34.0 pg   MCHC 33.2 30.0 - 36.0 g/dL   RDW 96.213.3 95.211.5 - 84.115.5 %   Platelets 224 150 - 400 K/uL   nRBC 0.0 0.0 - 0.2 %   Neutrophils Relative % 53 %   Neutro Abs 2.0 1.7 - 7.7 K/uL   Lymphocytes Relative 34 %   Lymphs Abs 1.3 0.7 - 4.0 K/uL   Monocytes Relative 12 %   Monocytes Absolute 0.5 0.1 - 1.0 K/uL   Eosinophils Relative 0 %   Eosinophils Absolute 0.0 0.0 - 0.5 K/uL   Basophils Relative 1 %   Basophils Absolute 0.0 0.0 - 0.1 K/uL   Immature Granulocytes 0 %   Abs Immature Granulocytes 0.01 0.00 - 0.07 K/uL    Comment: Performed at [facility], [address] [place], [place] [address]  I-Stat beta hCG blood, ED     Status: None   Collection Time: 04/03/19 11:32 AM  Result Value Ref Range   I-stat hCG, quantitative <5.0 <5 mIU/mL   Comment 3            Comment:   GEST. AGE      CONC.  (mIU/mL)   <=1 WEEK        5 - 50     2 WEEKS       50 - 500     3 WEEKS       100 - 10,000     4 WEEKS      1,000 - 30,000        FEMALE AND NON-PREGNANT FEMALE:     LESS THAN 5 mIU/mL   POC CBG, ED     Status: Abnormal   Collection Time: 04/03/19 11:48 AM  Result Value Ref Range   Glucose-Capillary 115 (H) 70 - 99 mg/dL  Urinalysis, Routine w reflex microscopic     Status: Abnormal   Collection Time: 04/03/19  1:11 PM  Result Value Ref Range   Color, Urine STRAW (A) YELLOW   APPearance HAZY (A) CLEAR   Specific Gravity, Urine 1.006 1.005 - 1.030   pH 7.0 5.0 - 8.0   Glucose, UA NEGATIVE NEGATIVE mg/dL   Hgb urine dipstick NEGATIVE NEGATIVE   Bilirubin Urine NEGATIVE NEGATIVE   Ketones, ur NEGATIVE NEGATIVE mg/dL   Protein, ur NEGATIVE NEGATIVE mg/dL   Nitrite NEGATIVE NEGATIVE   Leukocytes,Ua NEGATIVE NEGATIVE    Comment: Performed at [facility], [address] [place], [place] [address]  SARS Coronavirus 2 (CEPHEID- Performed in [facility] hospital lab), Hosp Order     Status: Abnormal   Collection Time: 04/03/19  1:19 PM   Specimen: Nasopharyngeal Swab  Result Value Ref Range   SARS Coronavirus 2 POSITIVE (A) NEGATIVE    Comment: RESULT CALLED TO, READ BACK BY AND VERIFIED WITH: Donnetta HailE. Deven Audi RN 14:30 04/03/19 ([email or web address]) (NOTE) If result is NEGATIVE SARS-CoV-2 target nucleic acids are NOT DETECTED. The SARS-CoV-2 RNA is generally detectable in upper and lower  respiratory specimens during the acute phase of infection. The lowest  concentration of SARS-CoV-2 viral copies this assay can detect is 250  copies / mL. A negative result does not preclude SARS-CoV-2 infection  and should not be used as the sole basis for treatment or other  patient management decisions.  A negative result may occur with  improper specimen collection / handling, submission of specimen other  than nasopharyngeal swab, presence of  viral mutation(s) within the  areas targeted by this assay, and inadequate number of viral copies  (<250 copies / mL). A negative result must be combined with clinical   observations, patient history, and epidemiological information. If result is POSITIVE SARS-CoV-2 target nucleic acids are DETECTED.  The SARS-CoV-2 RNA is generally detectable in upper and lower  respiratory specimens during the acute phase of infection.  Positive  results are indicative of active infection with SARS-CoV-2.  Clinical  correlation with patient history and other diagnostic information is  necessary to determine patient infection status.  Positive results do  not rule out bacterial infection or co-infection with other viruses. If result is PRESUMPTIVE POSTIVE SARS-CoV-2 nucleic acids MAY BE PRESENT.   A presumptive positive result was obtained on the submitted specimen  and confirmed on repeat testing.  While 2019 novel coronavirus  (SARS-CoV-2) nucleic acids may be present in the submitted sample  additional confirmatory testing may be necessary for epidemiological  and / or clinical management purposes  to differentiate between  SARS-CoV-2 and other Sarbecovirus currently known to infect humans.  If clinically indicated additional testing with an alternate test  methodology [phone]) i s advised. The SARS-CoV-2 RNA is generally  detectable in upper and lower respiratory specimens during the acute  phase of infection. The expected result is Negative. Fact Sheet for Patients:  StrictlyIdeas.no Fact Sheet for Healthcare Providers: BankingDealers.co.za This test is not yet approved or cleared by the [place] FDA and has been authorized for detection and/or diagnosis of SARS-CoV-2 by FDA under an Emergency Use Authorization (EUA).  This EUA will remain in effect (meaning this test can be used) for the duration of the COVID-19 declaration under Section 564(b)(1) of the Act, 21 U.S.C. section 360bbb-3(b)(1), unless the authorization is terminated or revoked sooner. Performed at [facility], [place] [address],  [place], [place] [address]    Dg Chest Portable 1 View  Result Date: 04/03/2019 CLINICAL DATA:  Shortness of breath EXAM: PORTABLE CHEST 1 VIEW COMPARISON:  June 28, 2015 FINDINGS: There is slight scarring in the medial left base. There is no edema or consolidation. Heart size and pulmonary vascular normal. No adenopathy. Cervical rib noted on the right. IMPRESSION: Slight scarring medial left base. No edema or consolidation. Stable cardiac silhouette. Cervical rib on the right noted. Electronically Signed   By: Lowella Grip III M.D.   On: 04/03/2019 12:10    Pending Labs Unresulted Labs (From admission, onward)    Start     Ordered   04/04/19 0500  CBC with Differential/Platelet  Daily,   R     04/03/19 1646   04/04/19 0500  Comprehensive metabolic panel  Daily,   R     04/03/19 1646   04/04/19 0500  C-reactive protein  Daily,   R     04/03/19 1646   04/04/19 0500  D-dimer, quantitative (not at [facility])  Daily,   R     04/03/19 1646   04/04/19 0500  Ferritin  Daily,   R     04/03/19 1646   04/04/19 0500  Magnesium  Daily,   R     04/03/19 1646   04/04/19 0500  Phosphorus  Daily,   R     04/03/19 1646   04/03/19 1646  Brain natriuretic peptide  Once,   STAT     04/03/19 1646   04/03/19 1646  C-reactive protein  Once,   STAT     04/03/19 1646  04/03/19 1646  D-dimer, quantitative (not at [facility])  Once,   STAT     04/03/19 1646   04/03/19 1646  Ferritin  Once,   STAT     04/03/19 1646   04/03/19 1646  Fibrinogen  Once,   STAT     04/03/19 1646   04/03/19 1646  Lactate dehydrogenase  Once,   STAT     04/03/19 1646   04/03/19 1646  Procalcitonin  Once,   STAT     04/03/19 1646   04/03/19 1643  HIV antibody (Routine Testing)  Once,   STAT     04/03/19 1646          Vitals/Pain Today's Vitals   04/03/19 1545 04/03/19 1630 04/03/19 1637 04/03/19 1654  BP: 134/90 126/81    Pulse: 92 94  95  Resp:      Temp:      TempSrc:      SpO2: 100% 100%  100%  Weight:      Height:       PainSc:   0-No pain     Isolation Precautions Airborne and Contact precautions  Medications Medications  amLODipine (NORVASC) tablet 10 mg (has no administration in time range)  pantoprazole (PROTONIX) EC tablet 40 mg (40 mg Oral Not Given 04/03/19 1707)  enoxaparin (LOVENOX) injection 40 mg (has no administration in time range)  acetaminophen (TYLENOL) tablet 650 mg (has no administration in time range)  oxyCODONE (Oxy IR/ROXICODONE) immediate release tablet 5 mg (has no administration in time range)  docusate sodium (COLACE) capsule 100 mg (has no administration in time range)  polyethylene glycol (MIRALAX / GLYCOLAX) packet 17 g (has no administration in time range)  bisacodyl (DULCOLAX) EC tablet 5 mg (has no administration in time range)  ondansetron (ZOFRAN) tablet 4 mg (has no administration in time range)    Or  ondansetron (ZOFRAN) injection 4 mg (has no administration in time range)  methylPREDNISolone sodium succinate (SOLU-MEDROL) 40 mg/mL injection 40 mg (has no administration in time range)  sodium chloride 0.9 % bolus 1,000 mL (0 mLs Intravenous Stopped 04/03/19 1305)  famotidine (PEPCID) IVPB 20 mg premix (0 mg Intravenous Stopped 04/03/19 1235)  potassium chloride SA (K-DUR) CR tablet 40 mEq (40 mEq Oral Given 04/03/19 1302)  potassium chloride 10 mEq in 100 mL IVPB (0 mEq Intravenous Stopped 04/03/19 1630)  alum & mag hydroxide-simeth (MAALOX/MYLANTA) 200-200-20 MG/5ML suspension 30 mL (30 mLs Oral Given 04/03/19 1303)    And  lidocaine (XYLOCAINE) 2 % viscous mouth solution 15 mL (15 mLs Oral Given 04/03/19 1304)    Mobility walks     Focused Assessments Pulmonary Assessment Handoff:  Lung sounds:   O2 Device: Room Air        R Recommendations: See Admitting Provider Note  Report given to:   Additional Notes:

## 2019-04-03 NOTE — H&P (Signed)
History and Physical    Tamara Cromerameka L Lutz ZOX:[id number] DOB: [birth year] DOA: 04/03/2019  PCP: Lennox SoldersWinfrey, Amanda C, MD Consultants:  None Patient coming from: Home    Chief Complaint: SOB  HPI: Tamara Church is a 43 y.o. female with medical history significant of HTN and anemia presenting with SOB. Patient [facility]  She subsequently began with midepigastric discomfort and belching that she attributed to GERD.  She started with SOB and cough yesterday.  ?fever.     ED Course:  COVID+.  4 days of GERD, productive cough, hard to take a deep breath.  88% with talking or ambulating.  K+ 2.5 - giving PO and IV.  EKG is reassuring, takes HCTZ.  Review of Systems: As per HPI; otherwise review of systems reviewed and negative.   Ambulatory Status:  Ambulates without assistance  Past Medical History:  Diagnosis Date  . Anemia   . Hypertension     Past Surgical History:  Procedure Laterality Date  . NO PAST SURGERIES      Social History   Socioeconomic History  . Marital status: Single    Spouse name: Not on file  . Number of children: Not on file  . Years of education: Not on file  . Highest education level: Not on file  Occupational History  . Not on file  Social Needs  . Financial resource strain: Not on file  . Food insecurity    Worry: Not on file    Inability: Not on file  . Transportation needs    Medical: Not on file    Non-medical: Not on file  Tobacco Use  . Smoking status: Never Smoker  . Smokeless tobacco: Never Used  Substance and Sexual Activity  . Alcohol use: No  . Drug use: No  . Sexual activity: Yes    Birth control/protection: None, I.U.D.    Comment: pt would like to start depo provera today  Lifestyle  . Physical activity    Days per week: Not on file    Minutes per session: Not on file  . Stress: Not on file  Relationships  . Social Musicianconnections    Talks on phone: Not on file    Gets  together: Not on file    Attends religious service: Not on file    Active member of club or organization: Not on file    Attends meetings of clubs or organizations: Not on file    Relationship status: Not on file  . Intimate partner violence    Fear of current or ex partner: Not on file    Emotionally abused: Not on file    Physically abused: Not on file    Forced sexual activity: Not on file  Other Topics Concern  . Not on file  Social History Narrative  . Not on file    Allergies  Allergen Reactions  . Ace Inhibitors Cough    Family History  Problem Relation Age of Onset  . Hypertension Mother     Prior to Admission medications   Medication Sig Start Date End Date Taking? Authorizing Provider  amLODipine (NORVASC) 10 MG tablet Take 1 tablet (10 mg total) by mouth at bedtime. 02/10/19  Yes Winfrey, Harlen LabsAmanda C, MD  hydrochlorothiazide (HYDRODIURIL) 25 MG tablet Take 1 tablet by mouth daily 02/10/19  Yes Winfrey, Harlen LabsAmanda C, MD  levonorgestrel (MIRENA) 20 MCG/24HR IUD 1 each by Intrauterine route once.   Yes [provider]  omeprazole (PRILOSEC) 20 MG capsule Take 20 mg by mouth daily.   Yes [provider]    Physical Exam: Vitals:   04/03/19 1317 04/03/19 1545 04/03/19 1630 04/03/19 1654  BP: (!) 139/91 134/90 126/81   Pulse: 99 92 94 95  Resp:      Temp:      TempSrc:      SpO2:  100% 100% 100%  Weight:      Height:         . General:  Appears moderately ill but is NAD . Eyes:  PERRL, EOMI, normal lids, iris . ENT:  grossly normal hearing, lips & tongue, mmm . Neck:  no LAD, masses or thyromegaly . Cardiovascular:  RR with mild tachycardia, no m/r/g. No LE edema.  Marland Kitchen. Respiratory:   CTA bilaterally with no wheezes/rales/rhonchi.  Normal respiratory effort. . Abdomen:  soft, NT, ND, NABS . Back:   normal alignment, no CVAT . Skin:  no rash or induration seen on limited exam . Musculoskeletal:  grossly normal tone BUE/BLE, good ROM, no bony  abnormality . Psychiatric:  grossly normal mood and affect, speech fluent and appropriate, AOx3 . Neurologic:  CN 2-12 grossly intact, moves all extremities in coordinated fashion, sensation intact    Radiological Exams on Admission: Dg Chest Portable 1 View  Result Date: 04/03/2019 CLINICAL DATA:  Shortness of breath EXAM: PORTABLE CHEST 1 VIEW COMPARISON:  June 28, 2015 FINDINGS: There is slight scarring in the medial left base. There is no edema or consolidation. Heart size and pulmonary vascular normal. No adenopathy. Cervical rib noted on the right. IMPRESSION: Slight scarring medial left base. No edema or consolidation. Stable cardiac silhouette. Cervical rib on the right noted. Electronically Signed   By: Bretta BangWilliam  Woodruff III M.D.   On: 04/03/2019 12:10    EKG: Independently reviewed.  Sinus tachycardia with rate 105; nonspecific ST changes with no evidence of acute ischemia   Labs on Admission: I have personally reviewed the available labs and imaging studies at the time of the admission.  Pertinent labs:   K+ 2.5 Glucose 131 WBC 3.8 HCG negative UA WNL COVID POSITIVE  Assessment/Plan Principal Problem:   COVID-19 virus infection Active Problems:   OBESITY, NOS   HYPERTENSION, BENIGN SYSTEMIC   Hyperglycemia   Hypokalemia   Acute respiratory failure with hypoxia due to COVID-19 -Patient with presenting with SOB and cough -No significant GI symptoms -Possible contact for COVID - child who visited her home over 7/4 weekend had headache and diarrhea -She does not have a usual home O2 requirement and is currently hypoxic to the mid-upper 80s with ambulation -The patient has comorbidities which may increase the risk for ARDS/MODS including: obesity, HTN -Pertinent labs concerning for COVID include leukopenia; other inflammatory markers are pending -CXR currently normal but CXR findings may lag behind  -[facility] for further evaluation, close monitoring, and  treatment -Monitor on telemetry x at least 24 hours -At this time, will attempt to avoid use of aerosolized medications and use HFAs instead -Will check daily labs including BMP with Mag, Phos; LFTs; CBC with differential; CRP; ferritin;  D-dimer -Will order steroids (1 mg/kg divided BID)  -Consider Remdesivir (pharmacy consult) given +COVID test, and hypoxia <94% on room air - can give if she develops abnormal CXR findings -If the patient shows clinical deterioration, consider transfer to ICU with PCCM consultation -Will attempt to maintain euvolemia to a net negative fluid status -Will ask the  patient to maintain an awake prone position for 16+ hours a day, if possible, with a minimum of 2-3 hours at a time -Given high-risk for ARDS/MODS, will use high-flow [place] up to 15L in a negative pressure room for now with PCCM consultation -If D-dimer <5, will use standard-dosed Lovenox for DVT prevention -If D-dimer >5, will use weight-based Lovenox prophylaxis at this time (0.5 mg/kg q12h) -Patient was seen wearing full PPE including: gown, gloves, head cover, N95, and face shield; donning and doffing was in compliance with current standards.  Hypokalemia -Repleted in ER with 30 mEq IV KCl and 40 mEq PO Kcl, which would be expected to raise K+ from 2.5 to 3.2. -Will give 1 additional dose of 40 mEp PO KCl at bedtime tonight and recheck in AM. -Will check Mag level.  Hyperglycemia -Will check A1c -Given her underlying body habitus, she is at increased risk for DM  HTN -Continue Norvasc -Hold HCTZ, as this is likely the source of her hypokalemia  Obesity -BMI 31 -She should be encouraged to pursue outpatient bariatric medicine and/or surgery consultation    DVT prophylaxis:  Lovenox  Code Status:  Full - confirmed with patient Family Communication: None present; she declined having me call family Disposition Plan:  Home once clinically improved Consults called: None  Admission status:  Admit - It is my clinical opinion that admission to INPATIENT is reasonable and necessary because of the expectation that this patient will require hospital care that crosses at least 2 midnights to treat this condition based on the medical complexity of the problems presented.  Given the aforementioned information, the predictability of an adverse outcome is felt to be significant.    Karmen Bongo MD [facility]   How to contact the [facility] Attending or Consulting provider [place] or covering provider during after hours [place], for this patient?  1. Check the care team in [facility] and look for a) attending/consulting [facility] provider listed and b) the [facility] team listed 2. Log into [email or web address] and use 's universal password to access. If you do not have the password, please contact the hospital operator. 3. Locate the [facility] provider you are looking for under [facility] and page to a number that you can be directly reached. 4. If you still have difficulty reaching the provider, please page the [facility] (Director on Call) for the Hospitalists listed on amion for assistance.   04/03/2019, 6:55 PM

## 2019-04-03 NOTE — ED Notes (Signed)
Requested phlebotomy to draw labs 

## 2019-04-03 NOTE — ED Notes (Signed)
Pt has maintained consistent contact with family and friends throughout the day.

## 2019-04-03 NOTE — ED Notes (Signed)
Pt. Ambulated with pulse ox. O2 sat. Stayed between 88 and 89 with a good pleth.

## 2019-04-03 NOTE — ED Provider Notes (Signed)
[facility] EMERGENCY DEPARTMENT Provider Note   CSN: [id number] Arrival date & time: 04/03/19  1041    History   Chief Complaint No chief complaint on file.   HPI Tamara Church is a 43 y.o. female with history of anemia, hypertension, GERD, obesity, CKD presents for evaluation of acute onset, progressively worsening belching and for 4 days.  She reports feeling heartburn sensation especially after meals for the last few days.  She states that she has been belching excessively and feeling bloated. Denies abdominal pain or chest pain. She states she feels as though she cannot catch her breath.  Developed cough productive of yellow sputum yesterday. Also endorses generalized weakness and excessive thirst.  Notes some increased urination yesterday.   Denies recent travel or surgeries, no hemoptysis, no prior history of DVT or PE, no leg swelling, and she is not on any estrogen or hormone replacement.  She is a non-smoker, denies recreational drug use or excessive alcohol intake.  She has tried taking her home Prilosec without relief of her symptoms.  No known sick contacts.       The history is provided by the patient.    Past Medical History:  Diagnosis Date  . Anemia   . Hypertension     Patient Active Problem List   Diagnosis Date Noted  . Laryngitis 10/22/2016  . Scabies 03/23/2012  . CKD (chronic kidney disease) stage 2, GFR 60-89 ml/min 03/21/2012  . Contraceptive management 01/31/2012  . Elevated serum creatinine 01/31/2012  . Encounter for IUD insertion 06/08/2011  . Breast engorgement 04/22/2011  . ANEMIA 09/07/2008  . GERD 09/04/2008  . OBESITY, NOS 11/18/2006  . HYPERTENSION, BENIGN SYSTEMIC 11/18/2006    Past Surgical History:  Procedure Laterality Date  . NO PAST SURGERIES       OB History    Gravida  6   Para  6   Term  6   Preterm  0   AB  0   Living  6     SAB  0   TAB  0   Ectopic  0   Multiple  0   Live Births              Home Medications    Prior to Admission medications   Medication Sig Start Date End Date Taking? Authorizing Provider  amLODipine (NORVASC) 10 MG tablet Take 1 tablet (10 mg total) by mouth at bedtime. 02/10/19  Yes Winfrey, Alcario Drought, MD  hydrochlorothiazide (HYDRODIURIL) 25 MG tablet Take 1 tablet by mouth daily 02/10/19  Yes Winfrey, Alcario Drought, MD  levonorgestrel (MIRENA) 20 MCG/24HR IUD 1 each by Intrauterine route once.   Yes [provider]  omeprazole (PRILOSEC) 20 MG capsule Take 20 mg by mouth daily.   Yes [provider]    Family History Family History  Problem Relation Age of Onset  . Hypertension Mother     Social History Social History   Tobacco Use  . Smoking status: Never Smoker  . Smokeless tobacco: Never Used  Substance Use Topics  . Alcohol use: No  . Drug use: No     Allergies   Ace inhibitors   Review of Systems Review of Systems  Constitutional: Negative for chills and fever.  Respiratory: Positive for cough and shortness of breath.   Cardiovascular: Negative for chest pain.  Gastrointestinal: Negative for abdominal pain, diarrhea, nausea and vomiting.  Endocrine: Positive for polydipsia and polyuria.  All other systems reviewed  and are negative.    Physical Exam Updated Vital Signs BP (!) 139/91   Pulse 99   Temp 99.2 F (37.3 C) (Oral)   Resp 16   Ht 5\' 4"  (1.626 m)   Wt 81.6 kg   SpO2 100%   BMI 30.90 kg/m   Physical Exam Vitals signs and nursing note reviewed.  Constitutional:      General: She is not in acute distress.    Appearance: She is well-developed. She is obese.  HENT:     Head: Normocephalic and atraumatic.  Eyes:     General:        Right eye: No discharge.        Left eye: No discharge.     Conjunctiva/sclera: Conjunctivae normal.  Neck:     Musculoskeletal: Neck supple.     Vascular: No JVD.     Trachea: No tracheal deviation.  Cardiovascular:     Rate and Rhythm: Regular  rhythm. Tachycardia present.     Pulses: Normal pulses.     Heart sounds: Normal heart sounds.     Comments: 2+ radial and DP/PT pulses bilaterally, Homans sign absent bilaterally, no lower extremity edema, no palpable cords, compartments are soft  Pulmonary:     Effort: Pulmonary effort is normal.     Breath sounds: Normal breath sounds.     Comments: Speaking in full sentences without difficulty, but SPO2 saturations dropped to 88% while talking. Abdominal:     General: Bowel sounds are normal. There is no distension.     Palpations: Abdomen is soft.     Tenderness: There is no abdominal tenderness. There is no guarding or rebound.  Skin:    General: Skin is warm and dry.     Findings: No erythema.  Neurological:     Mental Status: She is alert.  Psychiatric:        Behavior: Behavior normal.      ED Treatments / Results  Labs (all labs ordered are listed, but only abnormal results are displayed) Labs Reviewed  SARS CORONAVIRUS 2 (HOSPITAL ORDER, PERFORMED IN [facility]) - Abnormal; Notable for the following components:      Result Value   SARS Coronavirus 2 POSITIVE (*)    All other components within normal limits  COMPREHENSIVE METABOLIC PANEL - Abnormal; Notable for the following components:   Potassium 2.5 (*)    Chloride 96 (*)    Glucose, Bld 131 (*)    Total Protein 8.3 (*)    All other components within normal limits  CBC WITH DIFFERENTIAL/PLATELET - Abnormal; Notable for the following components:   WBC 3.8 (*)    All other components within normal limits  URINALYSIS, ROUTINE W REFLEX MICROSCOPIC - Abnormal; Notable for the following components:   Color, Urine STRAW (*)    APPearance HAZY (*)    All other components within normal limits  CBG MONITORING, ED - Abnormal; Notable for the following components:   Glucose-Capillary 115 (*)    All other components within normal limits  I-STAT BETA HCG BLOOD, ED ([facility], [facility], [facility] ONLY)    EKG EKG  Interpretation  Date/Time:  Monday April 03 2019 11:49:58 EDT Ventricular Rate:  105 PR Interval:    QRS Duration: 101 QT Interval:  328 QTC Calculation: 434 R Axis:   -3 Text Interpretation:  Sinus tachycardia Inferior infarct, age indeterminate Confirmed by Kristine RoyalMessick, Peter [phone]([id number]) on 04/03/2019 11:55:46 AM   Radiology Dg Chest Portable 1 View  Result Date: 04/03/2019 CLINICAL DATA:  Shortness of breath EXAM: PORTABLE CHEST 1 VIEW COMPARISON:  June 28, 2015 FINDINGS: There is slight scarring in the medial left base. There is no edema or consolidation. Heart size and pulmonary vascular normal. No adenopathy. Cervical rib noted on the right. IMPRESSION: Slight scarring medial left base. No edema or consolidation. Stable cardiac silhouette. Cervical rib on the right noted. Electronically Signed   By: Bretta BangWilliam  Woodruff III M.D.   On: 04/03/2019 12:10    Procedures .Critical Care Performed by: Jeanie SewerFawze, Helaina Stefano A, PA-C Authorized by: Jeanie SewerFawze, Sian Joles A, PA-C   Critical care provider statement:    Critical care time (minutes):  45   Critical care was necessary to treat or prevent imminent or life-threatening deterioration of the following conditions:  Respiratory failure and metabolic crisis   Critical care was time spent personally by me on the following activities:  Discussions with consultants, evaluation of patient's response to treatment, examination of patient, ordering and performing treatments and interventions, ordering and review of laboratory studies, ordering and review of radiographic studies, pulse oximetry, re-evaluation of patient's condition, obtaining history from patient or surrogate and review of old charts   (including critical care time)  Medications Ordered in ED Medications  potassium chloride 10 mEq in 100 mL IVPB (10 mEq Intravenous New Bag/Given 04/03/19 1406)  sodium chloride 0.9 % bolus 1,000 mL (0 mLs Intravenous Stopped 04/03/19 1305)  famotidine (PEPCID) IVPB 20 mg  premix (0 mg Intravenous Stopped 04/03/19 1235)  potassium chloride SA (K-DUR) CR tablet 40 mEq (40 mEq Oral Given 04/03/19 1302)  alum & mag hydroxide-simeth (MAALOX/MYLANTA) 200-200-20 MG/5ML suspension 30 mL (30 mLs Oral Given 04/03/19 1303)    And  lidocaine (XYLOCAINE) 2 % viscous mouth solution 15 mL (15 mLs Oral Given 04/03/19 1304)     Initial Impression / Assessment and Plan / ED Course  I have reviewed the triage vital signs and the nursing notes.  Pertinent labs & imaging results that were available during my care of the patient were reviewed by me and considered in my medical decision making (see chart for details).        Tamara Church was evaluated in Emergency Department on 04/03/2019 for the symptoms described in the history of present illness. She was evaluated in the context of the global COVID-19 pandemic, which necessitated consideration that the patient might be at risk for infection with the SARS-CoV-2 virus that causes COVID-19. Institutional protocols and algorithms that pertain to the evaluation of patients at risk for COVID-19 are in a state of rapid change based on information released by regulatory bodies including the CDC and federal and state organizations. These policies and algorithms were followed during the patient's care in the ED.  Patient presents for evaluation of cough, generalized weakness, abdominal bloating and belching.  She is borderline febrile, initially tachycardic with improvement on reassessment.  Vital signs otherwise stable although her oxygen levels desaturate to 88% while talking.  O2 sats completely stable at rest.  Blood work today reviewed by me significant for leukopenia with WC BC count of 3.8, hypokalemia with potassium of 2.5.  UA does not suggest UTI or nephrolithiasis.  Her COVID test is positive.  Chest x-ray shows slight scarring of the medial left base that this could be early pneumonitis or pneumonia.  Low suspicion of PE.  Doubt  ACS/MI in the absence of chest pain.  She was ambulated in her room with SPO2 saturations dropping to 88%.  Given p.o. and IV potassium replenishment and IV fluids.  Spoke with Dr. Ophelia CharterYates with [facility] hospitalist service who agrees to assume care of patient and bring to the hospital for further evaluation and management.  Discussed with Dr. Rodena MedinMessick who agrees with assessment and plan at this time.  Final Clinical Impressions(s) / ED Diagnoses   Final diagnoses:  COVID-19 virus infection  Hypoxia  Hypokalemia    ED Discharge Orders    None       Bennye AlmFawze, Diamond Martucci A, PA-C 04/03/19 1529    Wynetta FinesMessick, Peter C, MD 04/03/19 1843

## 2019-04-04 ENCOUNTER — Encounter (HOSPITAL_COMMUNITY): Payer: Self-pay

## 2019-04-04 ENCOUNTER — Other Ambulatory Visit: Payer: Self-pay

## 2019-04-04 LAB — COMPREHENSIVE METABOLIC PANEL
ALT: 28 U/L (ref 0–44)
AST: 27 U/L (ref 15–41)
Albumin: 4 g/dL (ref 3.5–5.0)
Alkaline Phosphatase: 49 U/L (ref 38–126)
Anion gap: 14 (ref 5–15)
BUN: 8 mg/dL (ref 6–20)
CO2: 24 mmol/L (ref 22–32)
Calcium: 9 mg/dL (ref 8.9–10.3)
Chloride: 99 mmol/L (ref 98–111)
Creatinine, Ser: 0.81 mg/dL (ref 0.44–1.00)
GFR calc Af Amer: >60 mL/min (ref >60)
GFR calc non Af Amer: >60 mL/min (ref >60)
Glucose, Bld: 154 mg/dL — ABNORMAL HIGH (ref 70–99)
Potassium: 3.3 mmol/L — ABNORMAL LOW (ref 3.5–5.1)
Sodium: 137 mmol/L (ref 135–145)
Total Bilirubin: 0.2 mg/dL — ABNORMAL LOW (ref 0.3–1.2)
Total Protein: 8.8 g/dL — ABNORMAL HIGH (ref 6.5–8.1)

## 2019-04-04 LAB — CBC WITH DIFFERENTIAL/PLATELET
Abs Immature Granulocytes: 0.02 10*3/uL (ref 0.00–0.07)
Basophils Absolute: 0 10*3/uL (ref 0.0–0.1)
Basophils Relative: 0 %
Eosinophils Absolute: 0 10*3/uL (ref 0.0–0.5)
Eosinophils Relative: 0 %
HCT: 40.8 % (ref 36.0–46.0)
Hemoglobin: 13.1 g/dL (ref 12.0–15.0)
Immature Granulocytes: 0 %
Lymphocytes Relative: 16 %
Lymphs Abs: 0.9 10*3/uL (ref 0.7–4.0)
MCH: 28.2 pg (ref 26.0–34.0)
MCHC: 32.1 g/dL (ref 30.0–36.0)
MCV: 87.7 fL (ref 80.0–100.0)
Monocytes Absolute: 0.2 10*3/uL (ref 0.1–1.0)
Monocytes Relative: 3 %
Neutro Abs: 4.3 10*3/uL (ref 1.7–7.7)
Neutrophils Relative %: 81 %
Platelets: 228 10*3/uL (ref 150–400)
RBC: 4.65 MIL/uL (ref 3.87–5.11)
RDW: 13.7 % (ref 11.5–15.5)
WBC: 5.4 10*3/uL (ref 4.0–10.5)
nRBC: 0 % (ref 0.0–0.2)

## 2019-04-04 LAB — MAGNESIUM: Magnesium: 2.4 mg/dL (ref 1.7–2.4)

## 2019-04-04 LAB — FERRITIN: Ferritin: 99 ng/mL (ref 11–307)

## 2019-04-04 LAB — C-REACTIVE PROTEIN: CRP: 1.7 mg/dL — ABNORMAL HIGH (ref <1.0)

## 2019-04-04 LAB — HIV ANTIBODY (ROUTINE TESTING W REFLEX): HIV Screen 4th Generation wRfx: NONREACTIVE

## 2019-04-04 LAB — D-DIMER, QUANTITATIVE: D-Dimer, Quant: 0.43 ug/mL-FEU (ref 0.00–0.50)

## 2019-04-04 MED ORDER — POTASSIUM CHLORIDE CRYS ER 20 MEQ PO TBCR
40.0000 meq | EXTENDED_RELEASE_TABLET | Freq: Once | ORAL | Status: AC
  Administered 2019-04-04: 11:00:00 40 meq via ORAL
  Filled 2019-04-04: qty 2

## 2019-04-04 MED ORDER — ALBUTEROL SULFATE HFA 108 (90 BASE) MCG/ACT IN AERS
2.0000 | INHALATION_SPRAY | Freq: Four times a day (QID) | RESPIRATORY_TRACT | Status: DC | PRN
  Filled 2019-04-04: qty 6.7

## 2019-04-04 MED ORDER — ALUM & MAG HYDROXIDE-SIMETH 200-200-20 MG/5ML PO SUSP
30.0000 mL | ORAL | Status: DC | PRN
  Administered 2019-04-04 – 2019-04-05 (×2): 30 mL via ORAL
  Filled 2019-04-04 (×2): qty 30

## 2019-04-04 NOTE — Progress Notes (Signed)
Called mother from contact list to give updates with no answer.

## 2019-04-04 NOTE — Progress Notes (Signed)
Patient has contacted husband and he is aware she is now at [facility].

## 2019-04-04 NOTE — Evaluation (Signed)
Physical Therapy Evaluation Patient Details Name: Tamara Church MRN: [id number] DOB: 06/23/1976 Today's Date: 04/04/2019   History of Present Illness  Pt is a 43 y.o. female admitted 04/03/19 with SOB, productive cough and midepigastric pain. Tested (+) COVID-19. PMH includes HTN, anemia.    Clinical Impression  Pt presents with an overall decrease in functional mobility secondary to above. PTA, pt independent and lives with children; multiple supportive family members today. Today, pt independent with ambulation and ADLs; reports breathing feels almost baseline. Unable to get reliable pleth on pulse ox with mobility (see values below). Educ on energy conservation, deep breathing, importance of continued mobility with current condition. Pt would benefit from continued acute PT services to maximize functional mobility and independence prior to return home.   SpO2 79-88% on RA with ambulation (bad pleth) SpO2 87-97% on 2L O2 [place] (bad pleth) SpO2 94% resting on 1L O2 [place] (good pleth) Pt asymptomatic.     Follow Up Recommendations No PT follow up    Equipment Recommendations  None recommended by PT    Recommendations for Other Services       Precautions / Restrictions Restrictions Weight Bearing Restrictions: No      Mobility  Bed Mobility Overal bed mobility: Independent                Transfers Overall transfer level: Independent Equipment used: None                Ambulation/Gait Ambulation/Gait assistance: Independent Gait Distance (Feet): 800 Feet Assistive device: None Gait Pattern/deviations: Step-through pattern;Decreased stride length   Gait velocity interpretation: 1.31 - 2.62 ft/sec, indicative of limited community Conservation officer, historic buildings [place] (Stroke Patients Only)       Balance Overall balance assessment: No apparent balance deficits (not formally assessed)                            High level balance activites: Side stepping;Backward walking;Direction changes;Turns;Sudden stops;Head turns High Level Balance Comments: No overt instability or LOB with higher level balance tasks             Pertinent Vitals/Pain Pain Assessment: No/denies pain    Home Living Family/patient expects to be discharged to:: Private residence Living Arrangements: Children Available Help at Discharge: Family;Available PRN/intermittently Type of Home: House Home Access: Stairs to enter Entrance Stairs-Rails: Right Entrance Stairs-Number of Steps: 8 Home Layout: One level Home Equipment: None      Prior Function Level of Independence: Independent         Comments: Does not work; enjoys spending time with family     Hand Dominance        Extremity/Trunk Assessment   Upper Extremity Assessment Upper Extremity Assessment: Overall WFL for tasks assessed    Lower Extremity Assessment Lower Extremity Assessment: Overall WFL for tasks assessed    Cervical / Trunk Assessment Cervical / Trunk Assessment: Normal  Communication   Communication: No difficulties  Cognition Arousal/Alertness: Awake/alert Behavior During Therapy: WFL for tasks assessed/performed Overall Cognitive Status: Within Functional Limits for tasks assessed                                        General Comments General comments (skin integrity, edema, etc.): Educ re:  energy conservation, potential for supplemental O2, return to physical activity, quarantine recommendations    Exercises     Assessment/Plan    PT Assessment Patient needs continued PT services  PT Problem List Cardiopulmonary status limiting activity;Decreased activity tolerance       PT Treatment Interventions Gait training;Stair training;Functional mobility training;Therapeutic activities;Therapeutic exercise;Balance training;Patient/family education    PT Goals (Current goals can be found in the Care Plan  section)  Acute Rehab PT Goals Patient Stated Goal: Return home tomorrow PT Goal Formulation: With patient Time For Goal Achievement: 04/18/19 Potential to Achieve Goals: Good    Frequency Min 3X/week   Barriers to discharge        Co-evaluation               AM-PAC PT "6 Clicks" Mobility  Outcome Measure Help needed turning from your back to your side while in a flat bed without using bedrails?: None Help needed moving from lying on your back to sitting on the side of a flat bed without using bedrails?: None Help needed moving to and from a bed to a chair (including a wheelchair)?: None Help needed standing up from a chair using your arms (e.g., wheelchair or bedside chair)?: None Help needed to walk in hospital room?: None Help needed climbing 3-5 steps with a railing? : None 6 Click Score: 24    End of Session Equipment Utilized During Treatment: Oxygen Activity Tolerance: Patient tolerated treatment well Patient left: in chair;with call bell/phone within reach Nurse Communication: Mobility status PT Visit Diagnosis: Other abnormalities of gait and mobility (R26.89)    Time: [id number] PT Time Calculation (min) (ACUTE ONLY): 22 min   Charges:   PT Evaluation $PT Eval Low Complexity: 1 Low        Ina HomesJaclyn Tumeka Chimenti, PT, DPT Acute Rehabilitation Services  Pager [phone] Office [phone]  Malachy ChamberJaclyn L Alynah Schone 04/04/2019, 10:29 AM

## 2019-04-04 NOTE — Progress Notes (Signed)
Pt educated regarding the importance of ambulating in the room, how to use the IS, and the process of decreasing oxgyen. Pt on 1L, up in chair.

## 2019-04-04 NOTE — Progress Notes (Signed)
PROGRESS NOTE                                                                                                                                                                                                             Patient Demographics:    Tamara Church, is a 43 y.o. female, DOB - 15-Feb-1976, PQD:[id number]  Outpatient Primary MD for the patient is Winfrey, Alcario Drought, MD    LOS - 1  Admit date - 04/03/2019    No chief complaint on file.      Brief Narrative  Tamara Church is a 43 y.o. female with medical history significant of HTN and anemia presenting with SOB. Patient reports having a friend and her daughter visit her [facility] July 4th.   Subjective:    [name] today has, No headache, No chest pain, No abdominal pain - No Nausea, No new weakness tingling or numbness, no. Cough - SOB.     Assessment  & Plan :     1. Acute Covid 19 Viral infection - stable inflammatory markers, no hypoxia or chest x-ray findings, symptom-free on 1 L nasal cannula and will try to titrate off oxygen, since she is symptom-free.  Today's labs are pending, now continue IV steroids at low-dose, if remains stable advance activity, monitor inflammatory markers likely discharge in 1 to 2 days.     ABG     Component Value Date/Time   TCO2 29 01/12/2012 2132    COVID-19 Labs  Recent Labs    04/03/19 2038  DDIMER 0.32  FERRITIN 103  LDH 182  CRP 1.0*    Lab Results  Component Value Date   SARSCOV2NAA POSITIVE (A) 04/03/2019     Hepatic Function Latest Ref Rng & Units 04/03/2019 05/30/2018 11/13/2014  Total Protein 6.5 - 8.1 g/dL 8.3(H) 8.7(H) 8.5(H)  Albumin 3.5 - 5.0 g/dL 3.8 4.4 4.4  AST 15 - 41 U/L 25 27 21   ALT 0 - 44 U/L 27 25 16   Alk Phosphatase 38 - 126 U/L 43 58 60  Total Bilirubin 0.3 - 1.2 mg/dL 0.4 0.7 0.5        Component Value Date/Time   BNP 18.2 04/03/2019 2038     Lab Results  Component Value Date    HGBA1C 6.2 (H) 04/03/2019    2.  Severe hypokalemia.  Today's labs are pending will follow along with magnesium levels.  3.  Hypertension.  Blood pressure stable, discontinue HCTZ upon discharge as she was admitted with severe life-threatening hypokalemia.  Continue Norvasc.   4.  Obesity.  BMI 31 follow with PCP.   Condition -fair  Family Communication  : None  Code Status : Full  Diet :   Diet Order            Diet regular Room service appropriate? Yes; Fluid consistency: Thin  Diet effective now               Disposition Plan  :  Home 1-2 days  Consults  :  None  Procedures  : None  PUD Prophylaxis :  PPI  DVT Prophylaxis  :  Lovenox    Lab Results  Component Value Date   PLT 228 04/04/2019    Inpatient Medications  Scheduled Meds: . amLODipine  10 mg Oral QHS  . docusate sodium  100 mg Oral BID  . enoxaparin (LOVENOX) injection  40 mg Subcutaneous Q24H  . methylPREDNISolone (SOLU-MEDROL) injection  40 mg Intravenous Q12H  . pantoprazole  40 mg Oral Daily   Continuous Infusions: PRN Meds:.acetaminophen, albuterol, bisacodyl, ondansetron **OR** ondansetron (ZOFRAN) IV, oxyCODONE, polyethylene glycol  Antibiotics  :    Anti-infectives (From admission, onward)   None       Time Spent in minutes  30   Lala Lund M.D on 04/04/2019 at 10:20 AM  To page go to [email or web address] - password [facility]  [facility] -  Office  [phone]   See all Orders from today for further details    Objective:   Vitals:   04/04/19 0135 04/04/19 0147 04/04/19 0354 04/04/19 0700  BP: 128/89 131/81 106/62 109/68  Pulse: 88  75 77  Resp: 16  18 18   Temp: 99.7 F (37.6 C)  97.9 F (36.6 C) 98.4 F (36.9 C)  TempSrc: Oral  Oral Oral  SpO2: 100%  100% 100%  Weight: 80.9 kg     Height: 5' 2"  (1.575 m)       Wt Readings from Last 3 Encounters:  04/04/19 80.9 kg  05/30/18 93 kg  12/23/17 102.7 kg     Intake/Output Summary (Last 24 hours) at  04/04/2019 1020 Last data filed at 04/03/2019 1630 Gross per 24 hour  Intake 1152.82 ml  Output -  Net 1152.82 ml     Physical Exam  Awake Alert, Oriented X 3, No new F.N deficits, Normal affect [place].AT,PERRAL Supple Neck,No JVD, No cervical lymphadenopathy appriciated.  Symmetrical Chest wall movement, Good air movement bilaterally, CTAB RRR,No Gallops,Rubs or new Murmurs, No Parasternal Heave +ve B.Sounds, Abd Soft, No tenderness, No organomegaly appriciated, No rebound - guarding or rigidity. No Cyanosis, Clubbing or edema, No new Rash or bruise      Data Review:    CBC Recent Labs  Lab 04/03/19 1127 04/04/19 0849  WBC 3.8* 5.4  HGB 13.0 13.1  HCT 39.1 40.8  PLT 224 228  MCV 86.7 87.7  MCH 28.8 28.2  MCHC 33.2 32.1  RDW 13.3 13.7  LYMPHSABS 1.3 0.9  MONOABS 0.5 0.2  EOSABS 0.0 0.0  BASOSABS 0.0 0.0    Chemistries  Recent Labs  Lab 04/03/19 1127 04/03/19 1854  NA 135  --   K 2.5*  --   CL 96*  --   CO2 26  --   GLUCOSE 131*  --   BUN 6  --  CREATININE 0.89  --   CALCIUM 9.3  --   MG  --  2.2  AST 25  --   ALT 27  --   ALKPHOS 43  --   BILITOT 0.4  --    ------------------------------------------------------------------------------------------------------------------ No results for input(s): CHOL, HDL, LDLCALC, TRIG, CHOLHDL, LDLDIRECT in the last 72 hours.  Lab Results  Component Value Date   HGBA1C 6.2 (H) 04/03/2019   ------------------------------------------------------------------------------------------------------------------ No results for input(s): TSH, T4TOTAL, T3FREE, THYROIDAB in the last 72 hours.  Invalid input(s): FREET3  Cardiac Enzymes No results for input(s): CKMB, TROPONINI, MYOGLOBIN in the last 168 hours.  Invalid input(s): CK ------------------------------------------------------------------------------------------------------------------    Component Value Date/Time   BNP 18.2 04/03/2019 2038    Micro Results  Recent Results (from the past 240 hour(s))  SARS Coronavirus 2 (CEPHEID- Performed in [place] hospital lab), Hosp Order     Status: Abnormal   Collection Time: 04/03/19  1:19 PM   Specimen: Nasopharyngeal Swab  Result Value Ref Range Status   SARS Coronavirus 2 POSITIVE (A) NEGATIVE Final    Comment: RESULT CALLED TO, READ BACK BY AND VERIFIED WITH: Marzella Schlein RN 14:30 04/03/19 ([email or web address]) (NOTE) If result is NEGATIVE SARS-CoV-2 target nucleic acids are NOT DETECTED. The SARS-CoV-2 RNA is generally detectable in upper and lower  respiratory specimens during the acute phase of infection. The lowest  concentration of SARS-CoV-2 viral copies this assay can detect is 250  copies / mL. A negative result does not preclude SARS-CoV-2 infection  and should not be used as the sole basis for treatment or other  patient management decisions.  A negative result may occur with  improper specimen collection / handling, submission of specimen other  than nasopharyngeal swab, presence of viral mutation(s) within the  areas targeted by this assay, and inadequate number of viral copies  (<250 copies / mL). A negative result must be combined with clinical  observations, patient history, and epidemiological information. If result is POSITIVE SARS-CoV-2 target nucleic acids are DETECTED.  The SARS-CoV-2 RNA is generally detectable in upper and lower  respiratory specimens during the acute phase of infection.  Positive  results are indicative of active infection with SARS-CoV-2.  Clinical  correlation with patient history and other diagnostic information is  necessary to determine patient infection status.  Positive results do  not rule out bacterial infection or co-infection with other viruses. If result is PRESUMPTIVE POSTIVE SARS-CoV-2 nucleic acids MAY BE PRESENT.   A presumptive positive result was obtained on the submitted specimen  and confirmed on repeat testing.  While 2019 novel coronavirus   (SARS-CoV-2) nucleic acids may be present in the submitted sample  additional confirmatory testing may be necessary for epidemiological  and / or clinical management purposes  to differentiate between  SARS-CoV-2 and other Sarbecovirus currently known to infect humans.  If clinically indicated additional testing with an alternate test  methodology [id number]) i s advised. The SARS-CoV-2 RNA is generally  detectable in upper and lower respiratory specimens during the acute  phase of infection. The expected result is Negative. Fact Sheet for Patients:  StrictlyIdeas.no Fact Sheet for Healthcare Providers: BankingDealers.co.za This test is not yet approved or cleared by the [place] FDA and has been authorized for detection and/or diagnosis of SARS-CoV-2 by FDA under an Emergency Use Authorization (EUA).  This EUA will remain in effect (meaning this test can be used) for the duration of the COVID-19 declaration under Section 564(b)(1) of the Act, 21 U.S.C.  section 360bbb-3(b)(1), unless the authorization is terminated or revoked sooner. Performed at [facility], [place] [address], [place], [place] [address]     Radiology Reports Dg Chest Portable 1 View  Result Date: 04/03/2019 CLINICAL DATA:  Shortness of breath EXAM: PORTABLE CHEST 1 VIEW COMPARISON:  June 28, 2015 FINDINGS: There is slight scarring in the medial left base. There is no edema or consolidation. Heart size and pulmonary vascular normal. No adenopathy. Cervical rib noted on the right. IMPRESSION: Slight scarring medial left base. No edema or consolidation. Stable cardiac silhouette. Cervical rib on the right noted. Electronically Signed   By: Lowella Grip III M.D.   On: 04/03/2019 12:10

## 2019-04-05 DIAGNOSIS — U071 COVID-19: Principal | ICD-10-CM

## 2019-04-05 LAB — COMPREHENSIVE METABOLIC PANEL
ALT: 26 U/L (ref 0–44)
AST: 23 U/L (ref 15–41)
Albumin: 3.5 g/dL (ref 3.5–5.0)
Alkaline Phosphatase: 43 U/L (ref 38–126)
Anion gap: 12 (ref 5–15)
BUN: 10 mg/dL (ref 6–20)
CO2: 25 mmol/L (ref 22–32)
Calcium: 9.1 mg/dL (ref 8.9–10.3)
Chloride: 102 mmol/L (ref 98–111)
Creatinine, Ser: 0.75 mg/dL (ref 0.44–1.00)
GFR calc Af Amer: >60 mL/min (ref >60)
GFR calc non Af Amer: >60 mL/min (ref >60)
Glucose, Bld: 133 mg/dL — ABNORMAL HIGH (ref 70–99)
Potassium: 3.8 mmol/L (ref 3.5–5.1)
Sodium: 139 mmol/L (ref 135–145)
Total Bilirubin: 0.2 mg/dL — ABNORMAL LOW (ref 0.3–1.2)
Total Protein: 7.8 g/dL (ref 6.5–8.1)

## 2019-04-05 LAB — CBC WITH DIFFERENTIAL/PLATELET
Abs Immature Granulocytes: 0.01 10*3/uL (ref 0.00–0.07)
Basophils Absolute: 0 10*3/uL (ref 0.0–0.1)
Basophils Relative: 0 %
Eosinophils Absolute: 0 10*3/uL (ref 0.0–0.5)
Eosinophils Relative: 0 %
HCT: 38 % (ref 36.0–46.0)
Hemoglobin: 12.2 g/dL (ref 12.0–15.0)
Immature Granulocytes: 0 %
Lymphocytes Relative: 17 %
Lymphs Abs: 0.9 10*3/uL (ref 0.7–4.0)
MCH: 28.2 pg (ref 26.0–34.0)
MCHC: 32.1 g/dL (ref 30.0–36.0)
MCV: 88 fL (ref 80.0–100.0)
Monocytes Absolute: 0.5 10*3/uL (ref 0.1–1.0)
Monocytes Relative: 9 %
Neutro Abs: 4 10*3/uL (ref 1.7–7.7)
Neutrophils Relative %: 74 %
Platelets: 229 10*3/uL (ref 150–400)
RBC: 4.32 MIL/uL (ref 3.87–5.11)
RDW: 13.8 % (ref 11.5–15.5)
WBC: 5.4 10*3/uL (ref 4.0–10.5)
nRBC: 0 % (ref 0.0–0.2)

## 2019-04-05 LAB — LACTATE DEHYDROGENASE: LDH: 148 U/L (ref 98–192)

## 2019-04-05 LAB — C-REACTIVE PROTEIN: CRP: 0.8 mg/dL (ref <1.0)

## 2019-04-05 LAB — MAGNESIUM: Magnesium: 2.5 mg/dL — ABNORMAL HIGH (ref 1.7–2.4)

## 2019-04-05 LAB — FERRITIN: Ferritin: 95 ng/mL (ref 11–307)

## 2019-04-05 LAB — D-DIMER, QUANTITATIVE: D-Dimer, Quant: 0.47 ug/mL-FEU (ref 0.00–0.50)

## 2019-04-05 MED ORDER — ACETAMINOPHEN 325 MG PO TABS: 650.0000 mg | ORAL_TABLET | Freq: Four times a day (QID) | ORAL | Status: DC | PRN

## 2019-04-05 MED ORDER — PANTOPRAZOLE SODIUM 40 MG PO TBEC: 40.0000 mg | DELAYED_RELEASE_TABLET | Freq: Every day | ORAL | 0 refills | Status: DC

## 2019-04-05 MED ORDER — PREDNISONE 10 MG PO TABS: ORAL_TABLET | ORAL | 0 refills | Status: AC

## 2019-04-05 MED ORDER — PREDNISONE 20 MG PO TABS: 40.0000 mg | ORAL_TABLET | Freq: Two times a day (BID) | ORAL | Status: DC

## 2019-04-05 NOTE — Discharge Instructions (Signed)
COVID-19: How to Protect Yourself and Others Know how it spreads  There is currently no vaccine to prevent coronavirus disease 2019 (COVID-19).  The best way to prevent illness is to avoid being exposed to this virus.  The virus is thought to spread mainly from person-to-person. ? Between people who are in close contact with one another (within about 6 feet). ? Through respiratory droplets produced when an infected person coughs, sneezes or talks. ? These droplets can land in the mouths or noses of people who are nearby or possibly be inhaled into the lungs. ? Some recent studies have suggested that COVID-19 may be spread by people who are not showing symptoms. Everyone should Clean your hands often  Wash your hands often with soap and water for at least 20 seconds especially after you have been in a public place, or after blowing your nose, coughing, or sneezing.  If soap and water are not readily available, use a hand sanitizer that contains at least 60% alcohol. Cover all surfaces of your hands and rub them together until they feel dry.  Avoid touching your eyes, nose, and mouth with unwashed hands. Avoid close contact  Stay home if you are sick.  Avoid close contact with people who are sick.  Put distance between yourself and other people. ? Remember that some people without symptoms may be able to spread virus. ? This is especially important for people who are at higher risk of getting very sick.www.cdc.gov/coronavirus/2019-ncov/need-extra-precautions/people-at-higher-risk.html Cover your mouth and nose with a cloth face cover when around others  You could spread COVID-19 to others even if you do not feel sick.  Everyone should wear a cloth face cover when they have to go out in public, for example to the grocery store or to pick up other necessities. ? Cloth face coverings should not be placed on young children under age 2, anyone who has trouble breathing, or is unconscious,  incapacitated or otherwise unable to remove the mask without assistance.  The cloth face cover is meant to protect other people in case you are infected.  Do NOT use a facemask meant for a healthcare worker.  Continue to keep about 6 feet between yourself and others. The cloth face cover is not a substitute for social distancing. Cover coughs and sneezes  If you are in a private setting and do not have on your cloth face covering, remember to always cover your mouth and nose with a tissue when you cough or sneeze or use the inside of your elbow.  Throw used tissues in the trash.  Immediately wash your hands with soap and water for at least 20 seconds. If soap and water are not readily available, clean your hands with a hand sanitizer that contains at least 60% alcohol. Clean and disinfect  Clean AND disinfect frequently touched surfaces daily. This includes tables, doorknobs, light switches, countertops, handles, desks, phones, keyboards, toilets, faucets, and sinks. www.cdc.gov/coronavirus/2019-ncov/prevent-getting-sick/disinfecting-your-home.html  If surfaces are dirty, clean them: Use detergent or soap and water prior to disinfection.  Then, use a household disinfectant. You can see a list of EPA-registered household disinfectants here. cdc.gov/coronavirus 01/24/2019 This information is not intended to replace advice given to you by your health care provider. Make sure you discuss any questions you have with your health care provider. Document Released: 01/03/2019 Document Revised: 02/01/2019 Document Reviewed: 01/03/2019 Elsevier Patient Education  2020 [employer]   COVID-19 COVID-19 is a respiratory infection that is caused by a virus called severe acute respiratory   syndrome coronavirus 2 (SARS-CoV-2). The disease is also known as coronavirus disease or novel coronavirus. In some people, the virus may not cause any symptoms. In others, it may cause a serious infection. The  infection can get worse quickly and can lead to complications, such as:  Pneumonia, or infection of the lungs.  Acute respiratory distress syndrome or ARDS. This is fluid build-up in the lungs.  Acute respiratory failure. This is a condition in which there is not enough oxygen passing from the lungs to the body.  Sepsis or septic shock. This is a serious bodily reaction to an infection.  Blood clotting problems.  Secondary infections due to bacteria or fungus. The virus that causes COVID-19 is contagious. This means that it can spread from person to person through droplets from coughs and sneezes (respiratory secretions). What are the causes? This illness is caused by a virus. You may catch the virus by:  Breathing in droplets from an infected person's cough or sneeze.  Touching something, like a table or a doorknob, that was exposed to the virus (contaminated) and then touching your mouth, nose, or eyes. What increases the risk? Risk for infection You are more likely to be infected with this virus if you:  Live in or travel to an area with a COVID-19 outbreak.  Come in contact with a sick person who recently traveled to an area with a COVID-19 outbreak.  Provide care for or live with a person who is infected with COVID-19. Risk for serious illness You are more likely to become seriously ill from the virus if you:  Are 65 years of age or older.  Have a long-term disease that lowers your body's ability to fight infection (immunocompromised).  Live in a nursing home or long-term care facility.  Have a long-term (chronic) disease such as: ? Chronic lung disease, including chronic obstructive pulmonary disease or asthma ? Heart disease. ? Diabetes. ? Chronic kidney disease. ? Liver disease.  Are obese. What are the signs or symptoms? Symptoms of this condition can range from mild to severe. Symptoms may appear any time from 2 to 14 days after being exposed to the virus.  They include:  A fever.  A cough.  Difficulty breathing.  Chills.  Muscle pains.  A sore throat.  Loss of taste or smell. Some people may also have stomach problems, such as nausea, vomiting, or diarrhea. Other people may not have any symptoms of COVID-19. How is this diagnosed? This condition may be diagnosed based on:  Your signs and symptoms, especially if: ? You live in an area with a COVID-19 outbreak. ? You recently traveled to or from an area where the virus is common. ? You provide care for or live with a person who was diagnosed with COVID-19.  A physical exam.  Lab tests, which may include: ? A nasal swab to take a sample of fluid from your nose. ? A throat swab to take a sample of fluid from your throat. ? A sample of mucus from your lungs (sputum). ? Blood tests.  Imaging tests, which may include, X-rays, CT scan, or ultrasound. How is this treated? At present, there is no medicine to treat COVID-19. Medicines that treat other diseases are being used on a trial basis to see if they are effective against COVID-19. Your health care provider will talk with you about ways to treat your symptoms. For most people, the infection is mild and can be managed at home with rest, fluids, and   over-the-counter medicines. Treatment for a serious infection usually takes places in a hospital intensive care unit (ICU). It may include one or more of the following treatments. These treatments are given until your symptoms improve.  Receiving fluids and medicines through an IV.  Supplemental oxygen. Extra oxygen is given through a tube in the nose, a face mask, or a hood.  Positioning you to lie on your stomach (prone position). This makes it easier for oxygen to get into the lungs.  Continuous positive airway pressure (CPAP) or bi-level positive airway pressure (BPAP) machine. This treatment uses mild air pressure to keep the airways open. A tube that is connected to a motor  delivers oxygen to the body.  Ventilator. This treatment moves air into and out of the lungs by using a tube that is placed in your windpipe.  Tracheostomy. This is a procedure to create a hole in the neck so that a breathing tube can be inserted.  Extracorporeal membrane oxygenation (ECMO). This procedure gives the lungs a chance to recover by taking over the functions of the heart and lungs. It supplies oxygen to the body and removes carbon dioxide. Follow these instructions at home: Lifestyle  If you are sick, stay home except to get medical care. Your health care provider will tell you how long to stay home. Call your health care provider before you go for medical care.  Rest at home as told by your health care provider.  Do not use any products that contain nicotine or tobacco, such as cigarettes, e-cigarettes, and chewing tobacco. If you need help quitting, ask your health care provider.  Return to your normal activities as told by your health care provider. Ask your health care provider what activities are safe for you. General instructions  Take over-the-counter and prescription medicines only as told by your health care provider.  Drink enough fluid to keep your urine pale yellow.  Keep all follow-up visits as told by your health care provider. This is important. How is this prevented?  There is no vaccine to help prevent COVID-19 infection. However, there are steps you can take to protect yourself and others from this virus. To protect yourself:   Do not travel to areas where COVID-19 is a risk. The areas where COVID-19 is reported change often. To identify high-risk areas and travel restrictions, check the CDC travel website: wwwnc.cdc.gov/travel/notices  If you live in, or must travel to, an area where COVID-19 is a risk, take precautions to avoid infection. ? Stay away from people who are sick. ? Wash your hands often with soap and water for 20 seconds. If soap and water  are not available, use an alcohol-based hand sanitizer. ? Avoid touching your mouth, face, eyes, or nose. ? Avoid going out in public, follow guidance from your state and local health authorities. ? If you must go out in public, wear a cloth face covering or face mask. ? Disinfect objects and surfaces that are frequently touched every day. This may include:  Counters and tables.  Doorknobs and light switches.  Sinks and faucets.  Electronics, such as phones, remote controls, keyboards, computers, and tablets. To protect others: If you have symptoms of COVID-19, take steps to prevent the virus from spreading to others.  If you think you have a COVID-19 infection, contact your health care provider right away. Tell your health care team that you think you may have a COVID-19 infection.  Stay home. Leave your house only to seek medical care.   Do not use public transport.  Do not travel while you are sick.  Wash your hands often with soap and water for 20 seconds. If soap and water are not available, use alcohol-based hand sanitizer.  Stay away from other members of your household. Let healthy household members care for children and pets, if possible. If you have to care for children or pets, wash your hands often and wear a mask. If possible, stay in your own room, separate from others. Use a different bathroom.  Make sure that all people in your household wash their hands well and often.  Cough or sneeze into a tissue or your sleeve or elbow. Do not cough or sneeze into your hand or into the air.  Wear a cloth face covering or face mask. Where to find more information  Centers for Disease Control and Prevention: www.cdc.gov/coronavirus/2019-ncov/index.html  World Health Organization: www.who.int/health-topics/coronavirus Contact a health care provider if:  You live in or have traveled to an area where COVID-19 is a risk and you have symptoms of the infection.  You have had contact  with someone who has COVID-19 and you have symptoms of the infection. Get help right away if:  You have trouble breathing.  You have pain or pressure in your chest.  You have confusion.  You have bluish lips and fingernails.  You have difficulty waking from sleep.  You have symptoms that get worse. These symptoms may represent a serious problem that is an emergency. Do not wait to see if the symptoms will go away. Get medical help right away. Call your local emergency services (911 in the [place] Do not drive yourself to the hospital. Let the emergency medical personnel know if you think you have COVID-19. Summary  COVID-19 is a respiratory infection that is caused by a virus. It is also known as coronavirus disease or novel coronavirus. It can cause serious infections, such as pneumonia, acute respiratory distress syndrome, acute respiratory failure, or sepsis.  The virus that causes COVID-19 is contagious. This means that it can spread from person to person through droplets from coughs and sneezes.  You are more likely to develop a serious illness if you are 65 years of age or older, have a weak immunity, live in a nursing home, or have chronic disease.  There is no medicine to treat COVID-19. Your health care provider will talk with you about ways to treat your symptoms.  Take steps to protect yourself and others from infection. Wash your hands often and disinfect objects and surfaces that are frequently touched every day. Stay away from people who are sick and wear a mask if you are sick. This information is not intended to replace advice given to you by your health care provider. Make sure you discuss any questions you have with your health care provider. Document Released: 10/13/2018 Document Revised: 02/02/2019 Document Reviewed: 10/13/2018 Elsevier Patient Education  2020 [employer]   COVID-19 Frequently Asked Questions COVID-19 (coronavirus disease) is an infection that is  caused by a large family of viruses. Some viruses cause illness in people and others cause illness in animals like camels, cats, and bats. In some cases, the viruses that cause illness in animals can spread to humans. Where did the coronavirus come from? In December 2019, [place] told the World Health Organization (WHO) of several cases of lung disease (human respiratory illness). These cases were linked to an open seafood and livestock market in the city of [place]. The link to the seafood   and livestock market suggests that the virus may have spread from animals to humans. However, since that first outbreak in December, the virus has also been shown to spread from person to person. What is the name of the disease and the virus? Disease name Early on, this disease was called novel coronavirus. This is because scientists determined that the disease was caused by a new (novel) respiratory virus. The World Health Organization (WHO) has now named the disease COVID-19, or coronavirus disease. Virus name The virus that causes the disease is called severe acute respiratory syndrome coronavirus 2 (SARS-CoV-2). More information on disease and virus naming World Health Organization (WHO): www.who.int/emergencies/diseases/novel-coronavirus-2019/technical-guidance/naming-the-coronavirus-disease-(covid-2019)-and-the-virus-that-causes-it Who is at risk for complications from coronavirus disease? Some people may be at higher risk for complications from coronavirus disease. This includes older adults and people who have chronic diseases, such as heart disease, diabetes, and lung disease. If you are at higher risk for complications, take these extra precautions:  Avoid close contact with people who are sick or have a fever or cough. Stay at least 3-6 ft (1-2 m) away from them, if possible.  Wash your hands often with soap and water for at least 20 seconds.  Avoid touching your face, mouth, nose, or eyes.  Keep  supplies on hand at home, such as food, medicine, and cleaning supplies.  Stay home as much as possible.  Avoid social gatherings and travel. How does coronavirus disease spread? The virus that causes coronavirus disease spreads easily from person to person (is contagious). There are also cases of community-spread disease. This means the disease has spread to:  People who have no known contact with other infected people.  People who have not traveled to areas where there are known cases. It appears to spread from one person to another through droplets from coughing or sneezing. Can I get the virus from touching surfaces or objects? There is still a lot that we do not know about the virus that causes coronavirus disease. Scientists are basing a lot of information on what they know about similar viruses, such as:  Viruses cannot generally survive on surfaces for long. They need a human body (host) to survive.  It is more likely that the virus is spread by close contact with people who are sick (direct contact), such as through: ? Shaking hands or hugging. ? Breathing in respiratory droplets that travel through the air. This can happen when an infected person coughs or sneezes on or near other people.  It is less likely that the virus is spread when a person touches a surface or object that has the virus on it (indirect contact). The virus may be able to enter the body if the person touches a surface or object and then touches his or her face, eyes, nose, or mouth. Can a person spread the virus without having symptoms of the disease? It may be possible for the virus to spread before a person has symptoms of the disease, but this is most likely not the main way the virus is spreading. It is more likely for the virus to spread by being in close contact with people who are sick and breathing in the respiratory droplets of a sick person's cough or sneeze. What are the symptoms of coronavirus  disease? Symptoms vary from person to person and can range from mild to severe. Symptoms may include:  Fever.  Cough.  Tiredness, weakness, or fatigue.  Fast breathing or feeling short of breath. These symptoms can appear anywhere   from 2 to 14 days after you have been exposed to the virus. If you develop symptoms, call your health care provider. People with severe symptoms may need hospital care. If I am exposed to the virus, how long does it take before symptoms start? Symptoms of coronavirus disease may appear anywhere from 2 to 14 days after a person has been exposed to the virus. If you develop symptoms, call your health care provider. Should I be tested for this virus? Your health care provider will decide whether to test you based on your symptoms, history of exposure, and your risk factors. How does a health care provider test for this virus? Health care providers will collect samples to send for testing. Samples may include:  Taking a swab of fluid from the nose.  Taking fluid from the lungs by having you cough up mucus (sputum) into a sterile cup.  Taking a blood sample.  Taking a stool or urine sample. Is there a treatment or vaccine for this virus? Currently, there is no vaccine to prevent coronavirus disease. Also, there are no medicines like antibiotics or antivirals to treat the virus. A person who becomes sick is given supportive care, which means rest and fluids. A person may also relieve his or her symptoms by using over-the-counter medicines that treat sneezing, coughing, and runny nose. These are the same medicines that a person takes for the common cold. If you develop symptoms, call your health care provider. People with severe symptoms may need hospital care. What can I do to protect myself and my family from this virus?     You can protect yourself and your family by taking the same actions that you would take to prevent the spread of other viruses. Take the  following actions:  Wash your hands often with soap and water for at least 20 seconds. If soap and water are not available, use alcohol-based hand sanitizer.  Avoid touching your face, mouth, nose, or eyes.  Cough or sneeze into a tissue, sleeve, or elbow. Do not cough or sneeze into your hand or the air. ? If you cough or sneeze into a tissue, throw it away immediately and wash your hands.  Disinfect objects and surfaces that you frequently touch every day.  Avoid close contact with people who are sick or have a fever or cough. Stay at least 3-6 ft (1-2 m) away from them, if possible.  Stay home if you are sick, except to get medical care. Call your health care provider before you get medical care.  Make sure your vaccines are up to date. Ask your health care provider what vaccines you need. What should I do if I need to travel? Follow travel recommendations from your local health authority, the CDC, and WHO. Travel information and advice  Centers for Disease Control and Prevention (CDC): www.cdc.gov/coronavirus/2019-ncov/travelers/index.html  World Health Organization (WHO): www.who.int/emergencies/diseases/novel-coronavirus-2019/travel-advice Know the risks and take action to protect your health  You are at higher risk of getting coronavirus disease if you are traveling to areas with an outbreak or if you are exposed to travelers from areas with an outbreak.  Wash your hands often and practice good hygiene to lower the risk of catching or spreading the virus. What should I do if I am sick? General instructions to stop the spread of infection  Wash your hands often with soap and water for at least 20 seconds. If soap and water are not available, use alcohol-based hand sanitizer.  Cough or sneeze into   a tissue, sleeve, or elbow. Do not cough or sneeze into your hand or the air.  If you cough or sneeze into a tissue, throw it away immediately and wash your hands.  Stay home  unless you must get medical care. Call your health care provider or local health authority before you get medical care.  Avoid public areas. Do not take public transportation, if possible.  If you can, wear a mask if you must go out of the house or if you are in close contact with someone who is not sick. Keep your home clean  Disinfect objects and surfaces that are frequently touched every day. This may include: ? Counters and tables. ? Doorknobs and light switches. ? Sinks and faucets. ? Electronics such as phones, remote controls, keyboards, computers, and tablets.  Wash dishes in hot, soapy water or use a dishwasher. Air-dry your dishes.  Wash laundry in hot water. Prevent infecting other household members  Let healthy household members care for children and pets, if possible. If you have to care for children or pets, wash your hands often and wear a mask.  Sleep in a different bedroom or bed, if possible.  Do not share personal items, such as razors, toothbrushes, deodorant, combs, brushes, towels, and washcloths. Where to find more information Centers for Disease Control and Prevention (CDC)  Information and news updates: www.cdc.gov/coronavirus/2019-ncov World Health Organization (WHO)  Information and news updates: www.who.int/emergencies/diseases/novel-coronavirus-2019  Coronavirus health topic: www.who.int/health-topics/coronavirus  Questions and answers on COVID-19: www.who.int/news-room/q-a-detail/q-a-coronaviruses  Global tracker: who.sprinklr.com American Academy of Pediatrics (AAP)  Information for families: www.healthychildren.org/English/health-issues/conditions/chest-lungs/Pages/2019-Novel-Coronavirus.aspx The coronavirus situation is changing rapidly. Check your local health authority website or the CDC and WHO websites for updates and news. When should I contact a health care provider?  Contact your health care provider if you have symptoms of an  infection, such as fever or cough, and you: ? Have been near anyone who is known to have coronavirus disease. ? Have come into contact with a person who is suspected to have coronavirus disease. ? Have traveled outside of the country. When should I get emergency medical care?  Get help right away by calling your local emergency services (911 in the [place] if you have: ? Trouble breathing. ? Pain or pressure in your chest. ? Confusion. ? Blue-tinged lips and fingernails. ? Difficulty waking from sleep. ? Symptoms that get worse. Let the emergency medical personnel know if you think you have coronavirus disease. Summary  A new respiratory virus is spreading from person to person and causing COVID-19 (coronavirus disease).  The virus that causes COVID-19 appears to spread easily. It spreads from one person to another through droplets from coughing or sneezing.  Older adults and those with chronic diseases are at higher risk of disease. If you are at higher risk for complications, take extra precautions.  There is currently no vaccine to prevent coronavirus disease. There are no medicines, such as antibiotics or antivirals, to treat the virus.  You can protect yourself and your family by washing your hands often, avoiding touching your face, and covering your coughs and sneezes. This information is not intended to replace advice given to you by your health care provider. Make sure you discuss any questions you have with your health care provider. Document Released: 01/03/2019 Document Revised: 01/03/2019 Document Reviewed: 01/03/2019 Elsevier Patient Education  2020 Elsevier Inc.  

## 2019-04-05 NOTE — Progress Notes (Signed)
Physical Therapy Treatment and Discharge Patient Details Name: Tamara Church MRN: [id number] DOB: Aug 15, 1976 Today's Date: 04/05/2019    History of Present Illness Pt is a 43 y.o. female admitted 04/03/19 with SOB, productive cough and midepigastric pain. Tested (+) COVID-19. PMH includes HTN, anemia.    PT Comments    Patient reports her nurse changed her pulse oximeter probe last evening and has been getting many fewer alarms since then. Ambulated in hallway x 800 ft with sats 100-97% on room air. Patient with no shortness of breath or imbalance. Reviewed activity she should continue to do in her room. Patient has no further PT needs and is discharged.    Follow Up Recommendations  No PT follow up     Equipment Recommendations  None recommended by PT    Recommendations for Other Services       Precautions / Restrictions Precautions Precautions: None Restrictions Weight Bearing Restrictions: No    Mobility  Bed Mobility                  Transfers Overall transfer level: Independent Equipment used: None                Ambulation/Gait Ambulation/Gait assistance: Independent Gait Distance (Feet): 800 Feet Assistive device: None Gait Pattern/deviations: WFL(Within Functional Limits)     General Gait Details: sats 98-100% probe on earlobe (on room air)   Stairs             Wheelchair Mobility    Modified Rankin (Stroke Patients Only)       Balance Overall balance assessment: No apparent balance deficits (not formally assessed)                             High Level Balance Comments: No overt instability or LOB with higher level balance tasks            Cognition Arousal/Alertness: Awake/alert Behavior During Therapy: WFL for tasks assessed/performed Overall Cognitive Status: Within Functional Limits for tasks assessed                                        Exercises      General Comments General  comments (skin integrity, edema, etc.): reviewed use of IS. Encouraged walking in her room, sit to stand exercises      Pertinent Vitals/Pain Pain Assessment: No/denies pain    Home Living Family/patient expects to be discharged to:: Private residence Living Arrangements: Children Available Help at Discharge: Family;Available PRN/intermittently Type of Home: House Home Access: Stairs to enter Entrance Stairs-Rails: Right Home Layout: One level Home Equipment: None      Prior Function Level of Independence: Independent      Comments: Does not work; enjoys spending time with family   PT Goals (current goals can now be found in the care plan section) Acute Rehab PT Goals Patient Stated Goal: Return home tomorrow Time For Goal Achievement: 04/18/19 Potential to Achieve Goals: Good Progress towards PT goals: Goals met/education completed, patient discharged from PT    Frequency    Min 3X/week      PT Plan Current plan remains appropriate    Co-evaluation              AM-PAC PT "6 Clicks" Mobility   Outcome Measure  Help needed turning from your back to your side while  in a flat bed without using bedrails?: None Help needed moving from lying on your back to sitting on the side of a flat bed without using bedrails?: None Help needed moving to and from a bed to a chair (including a wheelchair)?: None Help needed standing up from a chair using your arms (e.g., wheelchair or bedside chair)?: None Help needed to walk in hospital room?: None Help needed climbing 3-5 steps with a railing? : None 6 Click Score: 24    End of Session   Activity Tolerance: Patient tolerated treatment well Patient left: in chair;with call bell/phone within reach Nurse Communication: Mobility status PT Visit Diagnosis: Other abnormalities of gait and mobility (R26.89)   PT Discharge Note  Patient is being discharged from PT services secondary to:  Marland Kitchen Goals met and no further therapy  needs identified.  Please see latest Therapy Progress Note for current level of functioning and progress toward goals.  Progress and discharge plan and discussed with patient/caregiver and they  . Agree   Time: 1135-1150 PT Time Calculation (min) (ACUTE ONLY): 15 min  Charges:  $Gait Training: 8-22 mins                        [employer], PT 04/05/2019, 2:07 PM

## 2019-04-05 NOTE — Progress Notes (Signed)
Patient discharged to home, AVS reviewed and all questions answered. Public health contact signed in the sheet. Patient assisted to the exit via wheelchair, family provided transportation.

## 2019-04-05 NOTE — Discharge Summary (Signed)
DISCHARGE SUMMARY  Tamara Church  MR#: [id number]  DOB:[birth year]  Date of Admission: 04/03/2019 Date of Discharge: 04/05/2019  Attending Physician:Ricarda Atayde Hennie Duos, MD  Patient's FTD:DUKGURK, Tamara Drought, MD  Consults: none   Disposition: D/C home   Follow-up Appts: Follow-up Information    Church, Tamara Drought, MD. Schedule an appointment as soon as possible for a visit in 5 day(s).   Specialty: Family Medicine Contact information: [address] [place] [address] [phone]           Tests Needing Follow-up: -Assess oxygen saturations and general symptoms to suggest recurrent COVID  Discharge Diagnoses: SARS-CoV-2 positive Severe hypokalemia HTN Obesity - Body mass index is 32.63 kg/m  Initial presentation: 43 year old with a history of HTN and chronic anemia who presented to the [facility] ED with 24 hours of shortness of breath.  In the ED she was SARS-CoV-2 positive and saturating 88% on room air.  Hospital Course:  SARS-CoV-2 positive No chest x-ray findings during her hospital stay - no significant hypoxia encountered, despite hypoxia reported in ED at time of admit - d/c home on RA w/ sats of 100% - warned to return to ED immediately should she develop relapsing fevers or severe SOB -to complete a short course of steroids at time of discharge -no COVID specific medical therapy required during this admission  Severe hypokalemia Corrected with supplementation  Hyperglycemia  A1c not consistent with diabetes (6.2)  HTN Blood pressure well controlled at time of discharge  Obesity - Body mass index is 32.63 kg/m.   Allergies as of 04/05/2019      Reactions   Ace Inhibitors Cough      Medication List    STOP taking these medications   omeprazole 20 MG capsule Commonly known as: PRILOSEC Replaced by: pantoprazole 40 MG tablet     TAKE these medications   acetaminophen 325 MG tablet Commonly known as: TYLENOL Take 2 tablets (650  mg total) by mouth every 6 (six) hours as needed for mild pain or headache (fever >/= 101).   amLODipine 10 MG tablet Commonly known as: NORVASC Take 1 tablet (10 mg total) by mouth at bedtime.   hydrochlorothiazide 25 MG tablet Commonly known as: HYDRODIURIL Take 1 tablet by mouth daily   levonorgestrel 20 MCG/24HR IUD Commonly known as: MIRENA 1 each by Intrauterine route once.   pantoprazole 40 MG tablet Commonly known as: PROTONIX Take 1 tablet (40 mg total) by mouth daily. Start taking on: April 06, 2019 Replaces: omeprazole 20 MG capsule   predniSONE 10 MG tablet Commonly known as: DELTASONE Take 4 tablets (40 mg total) by mouth 2 (two) times daily with a meal for 1 day, THEN 4 tablets (40 mg total) daily with breakfast for 2 days, THEN 2 tablets (20 mg total) daily with breakfast for 2 days, THEN 1 tablet (10 mg total) daily with breakfast for 2 days. Start taking on: April 05, 2019       Day of Discharge BP 126/89 (BP Location: Left Arm)   Pulse 71   Temp (!) 97 F (36.1 C) (Oral)   Resp 20   Ht 5\' 2"  (1.575 m)   Wt 80.9 kg   SpO2 100%   BMI 32.63 kg/m   Physical Exam: General: No acute respiratory distress Lungs: Clear to auscultation bilaterally without wheezes or crackles Cardiovascular: Regular rate and rhythm without murmur gallop or rub normal S1 and S2 Abdomen: Nontender, nondistended, soft, bowel sounds positive, no rebound, no  ascites, no appreciable mass Extremities: No significant cyanosis, clubbing, or edema bilateral lower extremities  Basic Metabolic Panel: Recent Labs  Lab 04/03/19 1127 04/03/19 1854 04/04/19 0849 04/05/19 0344  NA 135  --  137 139  K 2.5*  --  3.3* 3.8  CL 96*  --  99 102  CO2 26  --  24 25  GLUCOSE 131*  --  154* 133*  BUN 6  --  8 10  CREATININE 0.89  --  0.81 0.75  CALCIUM 9.3  --  9.0 9.1  MG  --  2.2 2.4 2.5*    Liver Function Tests: Recent Labs  Lab 04/03/19 1127 04/04/19 0849 04/05/19 0344  AST 25 27  23   ALT 27 28 26   ALKPHOS 43 49 43  BILITOT 0.4 0.2* 0.2*  PROT 8.3* 8.8* 7.8  ALBUMIN 3.8 4.0 3.5   CBC: Recent Labs  Lab 04/03/19 1127 04/04/19 0849 04/05/19 0344  WBC 3.8* 5.4 5.4  NEUTROABS 2.0 4.3 4.0  HGB 13.0 13.1 12.2  HCT 39.1 40.8 38.0  MCV 86.7 87.7 88.0  PLT 224 228 229    BNP (last 3 results) Recent Labs    04/03/19 2038  BNP 18.2    CBG: Recent Labs  Lab 04/03/19 1148  GLUCAP 115*    Recent Results (from the past 240 hour(s))  SARS Coronavirus 2 (CEPHEID- Performed in [facility] hospital lab), Hosp Order     Status: Abnormal   Collection Time: 04/03/19  1:19 PM   Specimen: Nasopharyngeal Swab  Result Value Ref Range Status   SARS Coronavirus 2 POSITIVE (A) NEGATIVE Final    Comment: RESULT CALLED TO, READ BACK BY AND VERIFIED WITH: Donnetta HailE. Taylor RN 14:30 04/03/19 ([email or web address]) (NOTE) If result is NEGATIVE SARS-CoV-2 target nucleic acids are NOT DETECTED. The SARS-CoV-2 RNA is generally detectable in upper and lower  respiratory specimens during the acute phase of infection. The lowest  concentration of SARS-CoV-2 viral copies this assay can detect is 250  copies / mL. A negative result does not preclude SARS-CoV-2 infection  and should not be used as the sole basis for treatment or other  patient management decisions.  A negative result may occur with  improper specimen collection / handling, submission of specimen other  than nasopharyngeal swab, presence of viral mutation(s) within the  areas targeted by this assay, and inadequate number of viral copies  (<250 copies / mL). A negative result must be combined with clinical  observations, patient history, and epidemiological information. If result is POSITIVE SARS-CoV-2 target nucleic acids are DETECTED.  The SARS-CoV-2 RNA is generally detectable in upper and lower  respiratory specimens during the acute phase of infection.  Positive  results are indicative of active infection with SARS-CoV-2.   Clinical  correlation with patient history and other diagnostic information is  necessary to determine patient infection status.  Positive results do  not rule out bacterial infection or co-infection with other viruses. If result is PRESUMPTIVE POSTIVE SARS-CoV-2 nucleic acids MAY BE PRESENT.   A presumptive positive result was obtained on the submitted specimen  and confirmed on repeat testing.  While 2019 novel coronavirus  (SARS-CoV-2) nucleic acids may be present in the submitted sample  additional confirmatory testing may be necessary for epidemiological  and / or clinical management purposes  to differentiate between  SARS-CoV-2 and other Sarbecovirus currently known to infect humans.  If clinically indicated additional testing with an alternate test  methodology [phone]([id number]) i s advised. The  SARS-CoV-2 RNA is generally  detectable in upper and lower respiratory specimens during the acute  phase of infection. The expected result is Negative. Fact Sheet for Patients:  BoilerBrush.com.cyhttps://www.fda.gov/media/136312/download Fact Sheet for Healthcare Providers: https://pope.com/https://www.fda.gov/media/136313/download This test is not yet approved or cleared by the [place] FDA and has been authorized for detection and/or diagnosis of SARS-CoV-2 by FDA under an Emergency Use Authorization (EUA).  This EUA will remain in effect (meaning this test can be used) for the duration of the COVID-19 declaration under Section 564(b)(1) of the Act, 21 U.S.C. section 360bbb-3(b)(1), unless the authorization is terminated or revoked sooner. Performed at [facility], [address] [place], [place] [address]       Time spent in discharge (includes decision making & examination of pt): 30 minutes  04/05/2019, 5:02 PM   Lonia BloodJeffrey T. Haidyn Chadderdon, MD [facility] Office  [phone]

## 2019-04-09 ENCOUNTER — Other Ambulatory Visit: Payer: Self-pay

## 2019-04-09 ENCOUNTER — Encounter (HOSPITAL_COMMUNITY): Payer: Self-pay | Admitting: Emergency Medicine

## 2019-04-09 ENCOUNTER — Emergency Department (HOSPITAL_COMMUNITY): Payer: Medicaid Other

## 2019-04-09 ENCOUNTER — Emergency Department (HOSPITAL_COMMUNITY)
Admission: EM | Admit: 2019-04-09 | Discharge: 2019-04-09 | Disposition: A | Discharge status: Home / Self Care | Payer: Medicaid Other | Attending: Emergency Medicine | Admitting: Emergency Medicine

## 2019-04-09 DIAGNOSIS — R197 Diarrhea, unspecified: Secondary | ICD-10-CM | POA: Insufficient documentation

## 2019-04-09 DIAGNOSIS — Z79899 Other long term (current) drug therapy: Secondary | ICD-10-CM | POA: Insufficient documentation

## 2019-04-09 DIAGNOSIS — N182 Chronic kidney disease, stage 2 (mild): Secondary | ICD-10-CM | POA: Insufficient documentation

## 2019-04-09 DIAGNOSIS — U071 COVID-19: Secondary | ICD-10-CM | POA: Insufficient documentation

## 2019-04-09 DIAGNOSIS — I129 Hypertensive chronic kidney disease with stage 1 through stage 4 chronic kidney disease, or unspecified chronic kidney disease: Secondary | ICD-10-CM | POA: Diagnosis not present

## 2019-04-09 DIAGNOSIS — R0602 Shortness of breath: Secondary | ICD-10-CM | POA: Diagnosis not present

## 2019-04-09 LAB — CBC
HCT: 39.5 % (ref 36.0–46.0)
Hemoglobin: 13 g/dL (ref 12.0–15.0)
MCH: 28.6 pg (ref 26.0–34.0)
MCHC: 32.9 g/dL (ref 30.0–36.0)
MCV: 86.8 fL (ref 80.0–100.0)
Platelets: 370 10*3/uL (ref 150–400)
RBC: 4.55 MIL/uL (ref 3.87–5.11)
RDW: 13.4 % (ref 11.5–15.5)
WBC: 9.1 10*3/uL (ref 4.0–10.5)
nRBC: 0 % (ref 0.0–0.2)

## 2019-04-09 LAB — BASIC METABOLIC PANEL
Anion gap: 9 (ref 5–15)
BUN: 12 mg/dL (ref 6–20)
CO2: 26 mmol/L (ref 22–32)
Calcium: 9.3 mg/dL (ref 8.9–10.3)
Chloride: 104 mmol/L (ref 98–111)
Creatinine, Ser: 0.82 mg/dL (ref 0.44–1.00)
GFR calc Af Amer: >60 mL/min (ref >60)
GFR calc non Af Amer: >60 mL/min (ref >60)
Glucose, Bld: 153 mg/dL — ABNORMAL HIGH (ref 70–99)
Potassium: 3.5 mmol/L (ref 3.5–5.1)
Sodium: 139 mmol/L (ref 135–145)

## 2019-04-09 MED ORDER — FAMOTIDINE 20 MG PO TABS
20.0000 mg | ORAL_TABLET | Freq: Once | ORAL | Status: AC
  Administered 2019-04-09: 20 mg via ORAL
  Filled 2019-04-09: qty 1

## 2019-04-09 MED ORDER — SODIUM CHLORIDE 0.9 % IV BOLUS
1000.0000 mL | Freq: Once | INTRAVENOUS | Status: AC
  Administered 2019-04-09: 1000 mL via INTRAVENOUS

## 2019-04-09 NOTE — ED Triage Notes (Signed)
Patient states she was admitted for positive covid on Monday, spend two days and then was discharged home. Patient states began having diarrhea last night and some shortness of breath about 2 hours ago. Denies any fevers.

## 2019-04-09 NOTE — ED Notes (Signed)
PT states understanding of care given, follow up care, and medication prescribed. PT ambulated from ED to car with a steady gait. 

## 2019-04-09 NOTE — ED Provider Notes (Signed)
[facility] EMERGENCY DEPARTMENT Provider Note   CSN: [id number] Arrival date & time: 04/09/19  1550     History   Chief Complaint Chief Complaint  Patient presents with  . Diarrhea    HPI Gregary Cromerameka L Miyazaki is a 43 y.o. female.     HPI Patient is a 43 year old female who is tested positive for corona virus and was discharged [facility] several days ago.  She felt good at time of discharge.  Today she developed recurrent shortness of breath as well as diarrhea.  She denies fevers and chills.  She denies blood in her stool.  She reports her diarrhea is watery.  She feels a "indigestion sensation" in her anterior chest.  No history of MI.  No history of DVT or pulmonary embolism.  Denies nausea vomiting.   Past Medical History:  Diagnosis Date  . Anemia   . Hypertension     Patient Active Problem List   Diagnosis Date Noted  . COVID-19 virus infection 04/03/2019  . Laryngitis 10/22/2016  . Scabies 03/23/2012  . CKD (chronic kidney disease) stage 2, GFR 60-89 ml/min 03/21/2012  . Contraceptive management 01/31/2012  . Elevated serum creatinine 01/31/2012  . Encounter for IUD insertion 06/08/2011  . Breast engorgement 04/22/2011  . ANEMIA 09/07/2008  . GERD 09/04/2008  . OBESITY, NOS 11/18/2006  . HYPERTENSION, BENIGN SYSTEMIC 11/18/2006    Past Surgical History:  Procedure Laterality Date  . NO PAST SURGERIES       OB History    Gravida  6   Para  6   Term  6   Preterm  0   AB  0   Living  6     SAB  0   TAB  0   Ectopic  0   Multiple  0   Live Births               Home Medications    Prior to Admission medications   Medication Sig Start Date End Date Taking? Authorizing Provider  acetaminophen (TYLENOL) 325 MG tablet Take 2 tablets (650 mg total) by mouth every 6 (six) hours as needed for mild pain or headache (fever >/= 101). 04/05/19   Lonia BloodMcClung, Jeffrey T, MD  amLODipine (NORVASC) 10 MG tablet Take  1 tablet (10 mg total) by mouth at bedtime. 02/10/19   Lennox SoldersWinfrey, Amanda C, MD  hydrochlorothiazide (HYDRODIURIL) 25 MG tablet Take 1 tablet by mouth daily 02/10/19   Lennox SoldersWinfrey, Amanda C, MD  levonorgestrel (MIRENA) 20 MCG/24HR IUD 1 each by Intrauterine route once.    [provider]  pantoprazole (PROTONIX) 40 MG tablet Take 1 tablet (40 mg total) by mouth daily. 04/06/19   Lonia BloodMcClung, Jeffrey T, MD  predniSONE (DELTASONE) 10 MG tablet Take 4 tablets (40 mg total) by mouth 2 (two) times daily with a meal for 1 day, THEN 4 tablets (40 mg total) daily with breakfast for 2 days, THEN 2 tablets (20 mg total) daily with breakfast for 2 days, THEN 1 tablet (10 mg total) daily with breakfast for 2 days. 04/05/19 04/12/19  Lonia BloodMcClung, Jeffrey T, MD    Family History Family History  Problem Relation Age of Onset  . Hypertension Mother     Social History Social History   Tobacco Use  . Smoking status: Never Smoker  . Smokeless tobacco: Never Used  Substance Use Topics  . Alcohol use: No  . Drug use: No     Allergies  Ace inhibitors   Review of Systems Review of Systems  All other systems reviewed and are negative.    Physical Exam Updated Vital Signs BP 131/88 (BP Location: Right Arm)   Pulse 77   Temp 98.8 F (37.1 C) (Oral)   Resp 20   Ht 5\' 2"  (1.575 m)   Wt 80.9 kg   LMP 03/19/2019   SpO2 97%   BMI 32.63 kg/m   Physical Exam Vitals signs and nursing note reviewed.  Constitutional:      Appearance: She is well-developed.  HENT:     Head: Normocephalic.  Neck:     Musculoskeletal: Normal range of motion.  Cardiovascular:     Rate and Rhythm: Normal rate.  Pulmonary:     Effort: Pulmonary effort is normal. No respiratory distress.  Abdominal:     General: There is no distension.  Musculoskeletal: Normal range of motion.  Neurological:     Mental Status: She is alert and oriented to person, place, and time.      ED Treatments / Results  Labs (all labs  ordered are listed, but only abnormal results are displayed) Labs Reviewed  CBC  BASIC METABOLIC PANEL    EKG EKG Interpretation  Date/Time:  Sunday April 09 2019 16:09:37 EDT Ventricular Rate:  79 PR Interval:    QRS Duration: 96 QT Interval:  367 QTC Calculation: 421 R Axis:   13 Text Interpretation:  Sinus rhythm Low voltage, precordial leads No significant change was found Confirmed by Jola Schmidt [phone]) on 04/09/2019 4:49:38 PM   Radiology Dg Chest Portable 1 View  Result Date: 04/09/2019 CLINICAL DATA:  43 year old with acute onset of shortness of breath approximately 2 hours ago and acute onset of diarrhea that began last night. Patient admitted for COVID-19 5 days ago and discharged 3 days ago. EXAM: PORTABLE CHEST 1 VIEW COMPARISON:  04/03/2019, 06/28/2015. FINDINGS: Suboptimal inspiration accounts for crowded bronchovascular markings, especially in the bases, and accentuates the cardiac silhouette. Taking this into account, cardiac silhouette upper normal in size to mildly enlarged. Minimal linear atelectasis at the LEFT base. Lungs otherwise clear. Pulmonary vascularity normal. No visible pleural effusions. IMPRESSION: Suboptimal inspiration. Minimal linear atelectasis at the LEFT base. No acute cardiopulmonary disease otherwise. Electronically Signed   By: Evangeline Dakin M.D.   On: 04/09/2019 17:07    Procedures Procedures (including critical care time)  Medications Ordered in ED Medications  sodium chloride 0.9 % bolus 1,000 mL (1,000 mLs Intravenous New Bag/Given 04/09/19 1703)  famotidine (PEPCID) tablet 20 mg (20 mg Oral Given 04/09/19 1653)     Initial Impression / Assessment and Plan / ED Course  I have reviewed the triage vital signs and the nursing notes.  Pertinent labs & imaging results that were available during my care of the patient were reviewed by me and considered in my medical decision making (see chart for details).        No hypoxia.  Chest  x-ray with possible atelectasis versus remnant infiltrate.  Plan for IV hydration here in the emergency department and basic labs.  She is overall well-appearing.  I suspect she needs IV fluids and can safely be discharged from the ER to continue to self quarantine at home.   Final Clinical Impressions(s) / ED Diagnoses   Final diagnoses:  Diarrhea, unspecified type  COVID-19    ED Discharge Orders    None       Jola Schmidt, MD 04/09/19 1939

## 2019-05-17 ENCOUNTER — Other Ambulatory Visit: Payer: Self-pay | Admitting: Family Medicine

## 2019-06-01 DIAGNOSIS — L03011 Cellulitis of right finger: Secondary | ICD-10-CM | POA: Diagnosis not present

## 2019-07-11 ENCOUNTER — Other Ambulatory Visit: Payer: Self-pay | Admitting: Family Medicine

## 2019-07-12 ENCOUNTER — Telehealth: Payer: Self-pay | Admitting: Family Medicine

## 2019-07-12 NOTE — Telephone Encounter (Signed)
Would you call Ms. Franchini and ask her to make an appointment to be seen soon?  It has been about a year and a half since I have seen her, and I do not want to continue refilling her HCTZ without seeing her soon.  Thanks.

## 2019-07-12 NOTE — Telephone Encounter (Signed)
Pt has appt on 07/14/2019. Ottis Stain, CMA

## 2019-07-14 ENCOUNTER — Encounter: Payer: Self-pay | Admitting: Family Medicine

## 2019-07-14 ENCOUNTER — Other Ambulatory Visit: Payer: Self-pay

## 2019-07-14 ENCOUNTER — Ambulatory Visit (INDEPENDENT_AMBULATORY_CARE_PROVIDER_SITE_OTHER): Payer: Medicaid Other | Admitting: Family Medicine

## 2019-07-14 VITALS — BP 114/72 | HR 95 | Wt 217.6 lb

## 2019-07-14 DIAGNOSIS — K219 Gastro-esophageal reflux disease without esophagitis: Secondary | ICD-10-CM

## 2019-07-14 DIAGNOSIS — I10 Essential (primary) hypertension: Secondary | ICD-10-CM | POA: Diagnosis not present

## 2019-07-14 DIAGNOSIS — Z3040 Encounter for surveillance of contraceptives, unspecified: Secondary | ICD-10-CM

## 2019-07-14 DIAGNOSIS — Z0000 Encounter for general adult medical examination without abnormal findings: Secondary | ICD-10-CM | POA: Diagnosis not present

## 2019-07-14 DIAGNOSIS — Z3009 Encounter for other general counseling and advice on contraception: Secondary | ICD-10-CM | POA: Diagnosis not present

## 2019-07-14 LAB — POCT URINE PREGNANCY: Preg Test, Ur: NEGATIVE

## 2019-07-14 MED ORDER — HYDROCHLOROTHIAZIDE 25 MG PO TABS: 25.0000 mg | ORAL_TABLET | Freq: Every day | ORAL | 3 refills | Status: DC

## 2019-07-14 MED ORDER — FAMOTIDINE 20 MG PO TABS: 20.0000 mg | ORAL_TABLET | Freq: Two times a day (BID) | ORAL | 3 refills | Status: DC

## 2019-07-14 NOTE — Assessment & Plan Note (Signed)
Blood pressure normal today at 114/72.  We will continue amlodipine and HCTZ.

## 2019-07-14 NOTE — Progress Notes (Signed)
peg

## 2019-07-14 NOTE — Progress Notes (Signed)
   Subjective:    Tamara Church - 43 y.o. female MRN [id number]  Date of birth: 05/22/1976  CC:  Tamara Church is here for follow-up of hypertension, acid reflux, and contraception.  HPI: Hypertension Continues to take amlodipine and HCTZ daily for her blood pressure.  She tolerates these medications well.  Had a BMP in July 2020 that was within normal limits.  Contraception Has a Mirena IUD in place but it is 42 years old.  She would like for it to be replaced with a new Mirena soon.  She declines other contraception even though her IUD is out of date.  Acid reflux Is no longer taking Protonix, but says that she needs to be on something for her acid reflux, which bothers her daily.  She describes this as a burning pain in her esophagus that is worse when she lies down.  She denies other GI symptoms except for intermittent constipation.  Health Maintenance:  -Would like to wait on her Pap smear until she has her visit for her IUD placement.  Declines flu shot today. Health Maintenance Due  Topic Date Due  . PAP SMEAR-Modifier  11/07/2013  . INFLUENZA VACCINE  04/22/2019    -  reports that she has never smoked. She has never used smokeless tobacco. - Review of Systems: Per HPI. - Past Medical History: Patient Active Problem List   Diagnosis Date Noted  . Healthcare maintenance 07/14/2019  . Laryngitis 10/22/2016  . Scabies 03/23/2012  . CKD (chronic kidney disease) stage 2, GFR 60-89 ml/min 03/21/2012  . Encounter for counseling regarding contraception 01/31/2012  . Encounter for IUD insertion 06/08/2011  . Breast engorgement 04/22/2011  . ANEMIA 09/07/2008  . GERD 09/04/2008  . OBESITY, NOS 11/18/2006  . HYPERTENSION, BENIGN SYSTEMIC 11/18/2006   - Medications: reviewed and updated   Objective:   Physical Exam BP 114/72   Pulse 95   Wt 217 lb 9.6 oz (98.7 kg)   SpO2 99%   BMI 39.80 kg/m  Gen: NAD, alert, cooperative with exam, well-appearing HEENT: NCAT, clear  conjunctiva, supple neck CV: RRR, good S1/S2, no murmur, no edema Resp: CTABL, no wheezes, non-labored Abd: SNTND, BS present, no guarding or organomegaly Skin: no rashes, normal turgor, acanthosis nigricans Neuro: no gross deficits.  Psych: good insight, alert and oriented        Assessment & Plan:   Encounter for counseling regarding contraception Counseled patient that the IUD may be an effective due to its age and offered another birth control method until she can get the new one placed, but patient declined this.  Encouraged patient to make an appointment to have her IUD replaced soon.  HYPERTENSION, BENIGN SYSTEMIC Blood pressure normal today at 114/72.  We will continue amlodipine and HCTZ.  GERD Will discontinue Protonix and start Pepcid daily as needed.  Healthcare maintenance Patient declined flu shot today after counseling on the importance of this vaccination.  She was also counseled on the importance of getting a Pap smear as soon as possible.  We will plan to do this when she gets her IUD placed.  May also consider getting a hemoglobin A1c given her elevated glucose measurements on previous BMPs as well as her acanthosis nigricans.    Maia Breslow, M.D. 07/14/2019, 5:17 PM PGY-3, [place]

## 2019-07-14 NOTE — Assessment & Plan Note (Addendum)
Counseled patient that the IUD may be an effective due to its age and offered another birth control method until she can get the new one placed, but patient declined this.  Encouraged patient to make an appointment to have her IUD replaced soon.

## 2019-07-14 NOTE — Patient Instructions (Signed)
It was nice seeing you today [name]!  I have sent in a year supply of HCTZ for your blood pressure and a year supply of Pepcid for your acid reflux.  Try MiraLAX for your constipation and take as much as you need each day to have a soft bowel movement.  Please make an appointment upfront to have your IUD replaced.  You will need a Pap smear at that time.  If you have any questions or concerns, please feel free to call the clinic.   Be well,  Dr. Shan Levans

## 2019-07-14 NOTE — Assessment & Plan Note (Signed)
Patient declined flu shot today after counseling on the importance of this vaccination.  She was also counseled on the importance of getting a Pap smear as soon as possible.  We will plan to do this when she gets her IUD placed.  May also consider getting a hemoglobin A1c given her elevated glucose measurements on previous BMPs as well as her acanthosis nigricans.

## 2019-07-14 NOTE — Assessment & Plan Note (Signed)
Will discontinue Protonix and start Pepcid daily as needed.

## 2019-07-21 ENCOUNTER — Ambulatory Visit: Payer: Medicaid Other

## 2019-07-21 ENCOUNTER — Other Ambulatory Visit: Payer: Self-pay

## 2019-07-24 ENCOUNTER — Ambulatory Visit (INDEPENDENT_AMBULATORY_CARE_PROVIDER_SITE_OTHER): Payer: Medicaid Other | Admitting: Student in an Organized Health Care Education/Training Program

## 2019-07-24 ENCOUNTER — Other Ambulatory Visit: Payer: Self-pay

## 2019-07-24 ENCOUNTER — Other Ambulatory Visit (HOSPITAL_COMMUNITY)
Admission: RE | Admit: 2019-07-24 | Discharge: 2019-07-24 | Disposition: A | Discharge status: Home / Self Care | Payer: Medicaid Other | Source: Ambulatory Visit | Attending: Family Medicine | Admitting: Family Medicine

## 2019-07-24 VITALS — Wt 215.6 lb

## 2019-07-24 DIAGNOSIS — Z3009 Encounter for other general counseling and advice on contraception: Secondary | ICD-10-CM | POA: Diagnosis not present

## 2019-07-24 DIAGNOSIS — Z124 Encounter for screening for malignant neoplasm of cervix: Secondary | ICD-10-CM | POA: Insufficient documentation

## 2019-07-24 DIAGNOSIS — Z30430 Encounter for insertion of intrauterine contraceptive device: Secondary | ICD-10-CM | POA: Diagnosis not present

## 2019-07-24 DIAGNOSIS — Z0000 Encounter for general adult medical examination without abnormal findings: Secondary | ICD-10-CM

## 2019-07-24 LAB — POCT URINE PREGNANCY: Preg Test, Ur: NEGATIVE

## 2019-07-24 NOTE — Progress Notes (Signed)
   Subjective:    Patient ID: Tamara Church, female    DOB: 23-Dec-1975, 43 y.o.   MRN: [id number]  CC: pap and IUD insertion  HPI:  Patient has had IUD in place ~8 years. Does not desire pregnancy. Has not been using other form of contraceptive while IUD was expired and is sexually active. Denies concern for STDs at this time. Is sexually active with monogamous female partner. Denies discharge, pelvic pain, dysuria.   Smoking status reviewed   ROS: pertinent noted in the HPI   I have personally reviewed pertinent past medical history, surgical, family, and social history as appropriate.  Objective:  Wt 215 lb 9.6 oz (97.8 kg)   BMI 39.43 kg/m   Vitals and nursing note reviewed  General: NAD, pleasant, able to participate in exam Pelvic: negative for external genitalia lesions or abnormalities. IUD strings present at cervical os. No gross abnormalities noted. Extremities: no edema or cyanosis. Skin: warm and dry, no rashes noted Neuro: alert, no obvious focal deficits Psych: Normal affect and mood  Assessment & Plan:   Encounter for insertion of intrauterine contraceptive device (IUD) Removed current IUD which had expired and replaced with new. Discussed with patient the risks and benefits. Urine pregnancy was negative. Patient elected to return 2-4 weeks to check string placement    Healthcare maintenance Pap smear obtained today  Orders Placed This Encounter  Procedures  . POCT urine pregnancy   Meds ordered this encounter  Medications  . levonorgestrel (MIRENA) 20 MCG/24HR IUD 1 each    Doristine Mango, [place] PGY-2   Patient given informed consent for IUD insertion. She has no questions. Signed copy in chart. Patient placed in lithotomy position. Pap smear sample collected. expired IUD removed without difficulty or bleeding. Sterile prep and sterile speculum/equipment used. Cervix cleansed X 2 with betadine. Tenaculum used to secure  cervix by placement in anterior lip of cervix. No blood oozing from this site. Uterine sound used and then IUD placed without problems. IUD strings trimmed to 2 inches and patient taught how to check for them. Given manufacturer documents. No complications. MIRENA

## 2019-07-24 NOTE — Patient Instructions (Signed)
It was a pleasure to see you today!  To summarize our discussion for this visit:  We screened for cervical cancer today and also placed her IUD.  The procedure went well today but there are some minor risks of perforation, bleeding, infection that come with any invasive procedure.  Please call our office or go to the emergency department if you experience anything more than some mild cramping and spotting.  You can return in 2 to 4 weeks to check your string placement and they can be shortened if needed at that time.  Call the clinic at [phone] if your symptoms worsen or you have any concerns.   Thank you for allowing me to take part in your care,  Dr. Doristine Mango

## 2019-07-25 DIAGNOSIS — Z30430 Encounter for insertion of intrauterine contraceptive device: Secondary | ICD-10-CM | POA: Insufficient documentation

## 2019-07-25 MED ORDER — LEVONORGESTREL 20 MCG/24HR IU IUD
1.0000 | INTRAUTERINE_SYSTEM | Freq: Once | INTRAUTERINE | Status: AC
  Administered 2019-07-24: 16:00:00 1 via INTRAUTERINE

## 2019-07-25 NOTE — Assessment & Plan Note (Signed)
Pap smear obtained today. 

## 2019-07-25 NOTE — Assessment & Plan Note (Addendum)
Removed current IUD which had expired and replaced with new. Discussed with patient the risks and benefits. Urine pregnancy was negative. Patient elected to return 2-4 weeks to check string placement

## 2019-07-27 LAB — CYTOLOGY - PAP
Comment: NEGATIVE
Diagnosis: NEGATIVE
Diagnosis: REACTIVE
High risk HPV: NEGATIVE

## 2019-11-29 ENCOUNTER — Emergency Department (HOSPITAL_COMMUNITY)
Admission: EM | Admit: 2019-11-29 | Discharge: 2019-11-29 | Disposition: A | Discharge status: Home / Self Care | Payer: Medicaid Other | Attending: Emergency Medicine | Admitting: Emergency Medicine

## 2019-11-29 ENCOUNTER — Other Ambulatory Visit: Payer: Self-pay

## 2019-11-29 ENCOUNTER — Encounter (HOSPITAL_COMMUNITY): Payer: Self-pay | Admitting: Emergency Medicine

## 2019-11-29 DIAGNOSIS — R0981 Nasal congestion: Secondary | ICD-10-CM | POA: Diagnosis not present

## 2019-11-29 DIAGNOSIS — N182 Chronic kidney disease, stage 2 (mild): Secondary | ICD-10-CM | POA: Diagnosis not present

## 2019-11-29 DIAGNOSIS — Z79899 Other long term (current) drug therapy: Secondary | ICD-10-CM | POA: Diagnosis not present

## 2019-11-29 DIAGNOSIS — J029 Acute pharyngitis, unspecified: Secondary | ICD-10-CM | POA: Insufficient documentation

## 2019-11-29 DIAGNOSIS — Z8616 Personal history of COVID-19: Secondary | ICD-10-CM | POA: Insufficient documentation

## 2019-11-29 DIAGNOSIS — I129 Hypertensive chronic kidney disease with stage 1 through stage 4 chronic kidney disease, or unspecified chronic kidney disease: Secondary | ICD-10-CM | POA: Insufficient documentation

## 2019-11-29 DIAGNOSIS — R42 Dizziness and giddiness: Secondary | ICD-10-CM | POA: Diagnosis not present

## 2019-11-29 MED ORDER — FLUTICASONE PROPIONATE 50 MCG/ACT NA SUSP: 1.0000 | Freq: Every day | NASAL | 1 refills | Status: DC

## 2019-11-29 NOTE — ED Triage Notes (Signed)
C/o sore throat and sinus drainage that started today.  Took cough and cold medication and now has dizziness and feeling bad.

## 2019-11-29 NOTE — ED Provider Notes (Signed)
[facility] EMERGENCY DEPARTMENT Provider Note   CSN: [id number] Arrival date & time: 11/29/19  1607     History Chief Complaint  Patient presents with  . Dizziness  . Sore Throat  . Nasal Congestion    Tamara Church is a 44 y.o. female.  The history is provided by the patient. No language interpreter was used.  Dizziness Sore Throat    44 year old female previously diagnosed with COVID-19 in July of last year presenting complaining of sinus drainage.  Patient reports she spent time yesterday outside playing with her grandchildren.  She now endorsed having sinus congestion, throat irritation, nasal drainage, eye irritation and feeling stuffed up.  She described dizziness as stuffed up sensation.  She took some cough and cold medication this morning without relief.  She does not complain of any fever, headache, chest pain, trouble breathing, coughing or rash.  Past Medical History:  Diagnosis Date  . Anemia   . Hypertension     Patient Active Problem List   Diagnosis Date Noted  . Encounter for insertion of intrauterine contraceptive device (IUD) 07/25/2019  . Healthcare maintenance 07/14/2019  . Laryngitis 10/22/2016  . Scabies 03/23/2012  . CKD (chronic kidney disease) stage 2, GFR 60-89 ml/min 03/21/2012  . Encounter for counseling regarding contraception 01/31/2012  . Encounter for IUD insertion 06/08/2011  . Breast engorgement 04/22/2011  . ANEMIA 09/07/2008  . GERD 09/04/2008  . OBESITY, NOS 11/18/2006  . HYPERTENSION, BENIGN SYSTEMIC 11/18/2006    Past Surgical History:  Procedure Laterality Date  . NO PAST SURGERIES       OB History    Gravida  6   Para  6   Term  6   Preterm  0   AB  0   Living  6     SAB  0   TAB  0   Ectopic  0   Multiple  0   Live Births              Family History  Problem Relation Age of Onset  . Hypertension Mother     Social History   Tobacco Use  . Smoking status: Never  Smoker  . Smokeless tobacco: Never Used  Substance Use Topics  . Alcohol use: No  . Drug use: No    Home Medications Prior to Admission medications   Medication Sig Start Date End Date Taking? Authorizing Provider  amLODipine (NORVASC) 10 MG tablet TAKE 1 TABLET BY MOUTH AT BEDTIME 05/17/19   Lennox Solders, MD  famotidine (PEPCID) 20 MG tablet Take 1 tablet (20 mg total) by mouth 2 (two) times daily. 07/14/19   Lennox Solders, MD  hydrochlorothiazide (HYDRODIURIL) 25 MG tablet Take 1 tablet (25 mg total) by mouth daily. 07/14/19   Lennox Solders, MD  levonorgestrel (MIRENA) 20 MCG/24HR IUD 1 each by Intrauterine route once.    [provider]    Allergies    Ace inhibitors  Review of Systems   Review of Systems  Neurological: Positive for dizziness.  All other systems reviewed and are negative.   Physical Exam Updated Vital Signs BP (!) 143/94 (BP Location: Right Arm)   Pulse (!) 109   Temp 98.4 F (36.9 C) (Oral)   Resp 18   LMP 11/08/2019   SpO2 98%   Physical Exam Vitals and nursing note reviewed.  Constitutional:      General: She is not in acute distress.    Appearance: She  is well-developed.  HENT:     Head: Atraumatic.     Right Ear: Tympanic membrane normal.     Left Ear: Tympanic membrane normal.     Nose: No congestion or rhinorrhea.     Mouth/Throat:     Mouth: Mucous membranes are moist.     Pharynx: Uvula midline. No pharyngeal swelling or posterior oropharyngeal erythema.     Tonsils: No tonsillar exudate or tonsillar abscesses.  Eyes:     Conjunctiva/sclera: Conjunctivae normal.  Cardiovascular:     Rate and Rhythm: Normal rate and regular rhythm.     Heart sounds: Normal heart sounds.  Pulmonary:     Effort: Pulmonary effort is normal.     Breath sounds: Normal breath sounds. No wheezing or rhonchi.  Musculoskeletal:     Cervical back: Neck supple.  Skin:    Findings: No rash.  Neurological:     Mental Status: She is  alert.     ED Results / Procedures / Treatments   Labs (all labs ordered are listed, but only abnormal results are displayed) Labs Reviewed - No data to display  EKG None  Radiology No results found.  Procedures Procedures (including critical care time)  Medications Ordered in ED Medications - No data to display  ED Course  I have reviewed the triage vital signs and the nursing notes.  Pertinent labs & imaging results that were available during my care of the patient were reviewed by me and considered in my medical decision making (see chart for details).    MDM Rules/Calculators/A&P                      BP (!) 143/94 (BP Location: Right Arm)   Pulse (!) 109   Temp 98.4 F (36.9 C) (Oral)   Resp 18   LMP 11/08/2019   SpO2 98%   Final Clinical Impression(s) / ED Diagnoses Final diagnoses:  None    Rx / DC Orders ED Discharge Orders    None     5:00 PM Patient here with symptoms suggestive of seasonal allergies and less likely to be infectious etiology.  Dizziness is described more of a stiffness which is likely due to her allergies.  Throat examination unremarkable.  Blood pressure unremarkable.  Will prescribe Flonase.  Return precaution discussed.  Doubt COVID-19 infection.   Domenic Moras, PA-C 11/29/19 1703    Carmin Muskrat, MD 11/30/19 [phone]

## 2019-12-04 ENCOUNTER — Telehealth (INDEPENDENT_AMBULATORY_CARE_PROVIDER_SITE_OTHER): Payer: Medicaid Other | Admitting: Family Medicine

## 2019-12-04 ENCOUNTER — Other Ambulatory Visit: Payer: Self-pay

## 2019-12-04 DIAGNOSIS — J310 Chronic rhinitis: Secondary | ICD-10-CM | POA: Diagnosis not present

## 2019-12-04 DIAGNOSIS — J329 Chronic sinusitis, unspecified: Secondary | ICD-10-CM | POA: Diagnosis not present

## 2019-12-04 MED ORDER — LEVOCETIRIZINE DIHYDROCHLORIDE 5 MG PO TABS: 5.0000 mg | ORAL_TABLET | Freq: Every evening | ORAL | 0 refills | Status: DC

## 2019-12-04 MED ORDER — AZELASTINE HCL 0.1 % NA SOLN: 2.0000 | Freq: Two times a day (BID) | NASAL | 12 refills | Status: DC

## 2019-12-04 NOTE — Progress Notes (Signed)
[facility] Telemedicine Visit  Patient consented to have virtual visit. Method of visit: Video  Encounter participants: Patient: Tamara Church - located at home Provider: Garnette Gunner - located at office Others (if applicable): N/A  Chief Complaint: Continued sinus drainage and sore throat  HPI:  Patient was seen in the emergency department on 3/10 for sore throat and sinus congestion.  Was started on Flonase.  Still has sore throat.  Mildly improved.  Also now with ear fullness.  Denies any fevers, chills, facial pain.  Does endorse dry cough.  She is not taking anything for the pain.  Patient is curious about antibiotics prescription.  Spoke to patient minimal risk benefits of antibiotics.  Given some improvement with Flonase, describe how although this medication is the mainstay of treatment for allergic sinus conditions, it does take some time to reach full therapeutic effect.  Discussed with patient complementary medications.  Patient felt this was a good plan.  ROS: per HPI  Pertinent PMHx: Noncontributory  Exam:  Gen: NAD, resting comfortably HEENT: EOMI Pulm: NWOB Skin: no rash on face Neuro: no facial asymmetry or dysmetria Psych: Normal affect  Assessment/Plan:  Rhinosinusitis Uncontrolled symptoms on Flonase.  Has not been on the medication long enough to have peak therapeutic effect. -Continue Flonase -Add Astelin and levocetirizine    Time spent during visit with patient: 10 minutes

## 2019-12-04 NOTE — Assessment & Plan Note (Signed)
Uncontrolled symptoms on Flonase.  Has not been on the medication long enough to have peak therapeutic effect. -Continue Flonase -Add Astelin and levocetirizine

## 2019-12-05 ENCOUNTER — Ambulatory Visit (HOSPITAL_COMMUNITY)
Admission: EM | Admit: 2019-12-05 | Discharge: 2019-12-05 | Disposition: A | Discharge status: Home / Self Care | Payer: Medicaid Other | Attending: Emergency Medicine | Admitting: Emergency Medicine

## 2019-12-05 ENCOUNTER — Encounter (HOSPITAL_COMMUNITY): Payer: Self-pay | Admitting: Emergency Medicine

## 2019-12-05 ENCOUNTER — Other Ambulatory Visit: Payer: Self-pay

## 2019-12-05 DIAGNOSIS — H66011 Acute suppurative otitis media with spontaneous rupture of ear drum, right ear: Secondary | ICD-10-CM | POA: Diagnosis not present

## 2019-12-05 DIAGNOSIS — I10 Essential (primary) hypertension: Secondary | ICD-10-CM

## 2019-12-05 MED ORDER — AMOXICILLIN-POT CLAVULANATE 875-125 MG PO TABS: 1.0000 | ORAL_TABLET | Freq: Two times a day (BID) | ORAL | 0 refills | Status: AC

## 2019-12-05 MED ORDER — AMOXICILLIN-POT CLAVULANATE 875-125 MG PO TABS: 1.0000 | ORAL_TABLET | Freq: Two times a day (BID) | ORAL | 0 refills | Status: DC

## 2019-12-05 NOTE — ED Triage Notes (Addendum)
Pt here for cough and right ear pain x 1 week; pt sts taking cold meds; pt sts nasal drainage and congestion; pt wants to wait for eval by provider for covid swab

## 2019-12-05 NOTE — ED Provider Notes (Signed)
HPI  SUBJECTIVE:  Tamara Church is a 44 y.o. female who presents with 4 days of right ear pain described as throbbing.  Became much worse last night.  Reports clear rhinorrhea starting last night with improvement in her pain.  She reports decreased/absent hearing in the right ear.  No vertigo, tinnitus, foreign body insertion, recent swimming.  No fevers.  She reports continued nasal congestion.  She was seen in the ED 6 days ago for nasal congestion, dizziness, thought to have allergies, started on Flonase.  She was started on Astelin nasal spray levocetirizine by her PMD yesterday with some improvement in the nasal congestion.  She tried Tylenol 1000 mg once for her ear pain last night without improvement in her symptoms.  No aggravating factors.  No recent antibiotics.  No antipyretic in the past 4 to 6 hours.  States that she has been "blowing her nose lots".  No sinus pain or pressure.  She is a past medical history of Covid in July 2020 states this does not feel similar.  She has a history of chronic kidney disease, hypertension, has not yet taken her blood pressure medications today.  No history of diabetes, otitis media.  LMP: End of last month.  Denies possibility being pregnant.  [facility]    Past Medical History:  Diagnosis Date  . Anemia   . Hypertension     Past Surgical History:  Procedure Laterality Date  . NO PAST SURGERIES      Family History  Problem Relation Age of Onset  . Hypertension Mother     Social History   Tobacco Use  . Smoking status: Never Smoker  . Smokeless tobacco: Never Used  Substance Use Topics  . Alcohol use: No  . Drug use: No    No current facility-administered medications for this encounter.  Current Outpatient Medications:  .  amLODipine (NORVASC) 10 MG tablet, TAKE 1 TABLET BY MOUTH AT BEDTIME, Disp: 90 tablet, Rfl: 3 .  amoxicillin-clavulanate (AUGMENTIN) 875-125 MG tablet, Take 1 tablet by mouth 2 (two) times  daily for 10 days., Disp: 20 tablet, Rfl: 0 .  azelastine (ASTELIN) 0.1 % nasal spray, Place 2 sprays into both nostrils 2 (two) times daily. Use in each nostril as directed, Disp: 30 mL, Rfl: 12 .  famotidine (PEPCID) 20 MG tablet, Take 1 tablet (20 mg total) by mouth 2 (two) times daily., Disp: 90 tablet, Rfl: 3 .  fluticasone (FLONASE) 50 MCG/ACT nasal spray, Place 1 spray into both nostrils daily., Disp: 18.2 mL, Rfl: 1 .  hydrochlorothiazide (HYDRODIURIL) 25 MG tablet, Take 1 tablet (25 mg total) by mouth daily., Disp: 90 tablet, Rfl: 3 .  levocetirizine (XYZAL) 5 MG tablet, Take 1 tablet (5 mg total) by mouth every evening., Disp: 30 tablet, Rfl: 0 .  levonorgestrel (MIRENA) 20 MCG/24HR IUD, 1 each by Intrauterine route once., Disp: , Rfl:   Allergies  Allergen Reactions  . Ace Inhibitors Cough     ROS  As noted in HPI.   Physical Exam  BP (!) 171/98 (BP Location: Right Arm)   Pulse (!) 116   Temp 98.3 F (36.8 C) (Oral)   Resp 18   LMP 11/08/2019   SpO2 100%   Constitutional: Well developed, well nourished, no acute distress Eyes:  EOMI, conjunctiva normal bilaterally HENT: Normocephalic, atraumatic,mucus membranes moist.  Right external ear normal.  Positive purulent otorrhea.  Absent hearing right ear.  Mild pain with traction on pinna.  No pain with palpation of mastoid or tragus.  No swelling behind the ear.  Right external ear canal normal.  Positive purulent otorrhea partially obscuring the TM.  TM erythematous, dull. Left TM normal Mild nasal congestion.  No maxillary or frontal sinus tenderness. Respiratory: Normal inspiratory effort Cardiovascular: Tachycardic GI: nondistended skin: No rash, skin intact Musculoskeletal: no deformities Neurologic: Alert & oriented x 3, no focal neuro deficits Psychiatric: Speech and behavior appropriate   ED Course   Medications - No data to display  No orders of the defined types were placed in this encounter.   No  results found for this or any previous visit (from the past 24 hour(s)). No results found.  ED Clinical Impression  1. Non-recurrent acute suppurative otitis media of right ear with spontaneous rupture of tympanic membrane   2. Essential hypertension      ED Assessment/Plan  Suspect otitis media with perforation.  Unable to completely visualize TM because of the otorrhea, however she is unable to hear, and she reports pain that has resolved since she started having otorrhea.  Will send home with Augmentin for 10 days.  This will also cover a sinusitis.  Continue Flonase, levocetirizine it seems to be helping.  Advised saline nasal irrigation with a Lloyd Huger med rinse and distilled water as often as she wants.  Follow-up with [facility] or with her PMD for recheck of her tympanic membrane in a week or 2.  Last BUN/creatinine normal on 03/2019  Blood pressure noted.  Patient is asymptomatic.  We will have her monitor her blood pressure at home.  Follow-up with PMD if blood pressure remains elevated.  Gave her strict ER return precautions.  Discussed  MDM, treatment plan, and plan for follow-up with patient. Discussed sn/sx that should prompt return to the ED. patient agrees with plan.   Meds ordered this encounter  Medications  . DISCONTD: amoxicillin-clavulanate (AUGMENTIN) 875-125 MG tablet    Sig: Take 1 tablet by mouth 2 (two) times daily for 10 days.    Dispense:  14 tablet    Refill:  0  . amoxicillin-clavulanate (AUGMENTIN) 875-125 MG tablet    Sig: Take 1 tablet by mouth 2 (two) times daily for 10 days.    Dispense:  20 tablet    Refill:  0    *This clinic note was created using Scientist, clinical (histocompatibility and immunogenetics). Therefore, there may be occasional mistakes despite careful proofreading.   ?   Domenick Gong, MD 12/05/19 1935

## 2019-12-05 NOTE — Discharge Instructions (Addendum)
Not get your ear wet.  Make sure you wear an ear plug made out of wax or silly putty every time you bathe.  Finish the Augmentin, even if you feel better.  Saline nasal irrigation with a Lloyd Huger med rinse and distilled water as often as you want.  Continue the Flonase and levocetirizine.  Decrease your salt intake. diet and exercise will lower your blood pressure significantly. It is important to keep your blood pressure under good control, as having a elevated blood pressure for prolonged periods of time significantly increases your risk of stroke, heart attacks, kidney damage, eye damage, and other problems. Measure your blood pressure once a day, preferably at the same time every day. Keep a log of this and bring it to your next doctor's appointment.  Bring your blood pressure cuff as well.  Return immediately to the ER if you start having chest pain, headache, problems seeing, problems talking, problems walking, if you feel like you're about to pass out, if you do pass out, if you have a seizure, or for any other concerns.  Go to www.goodrx.com to look up your medications. This will give you a list of where you can find your prescriptions at the most affordable prices. Or ask the pharmacist what the cash price is, or if they have any other discount programs available to help make your medication more affordable. This can be less expensive than what you would pay with insurance.

## 2019-12-12 ENCOUNTER — Ambulatory Visit (INDEPENDENT_AMBULATORY_CARE_PROVIDER_SITE_OTHER): Payer: Medicaid Other | Admitting: Student in an Organized Health Care Education/Training Program

## 2019-12-12 ENCOUNTER — Other Ambulatory Visit: Payer: Self-pay

## 2019-12-12 ENCOUNTER — Encounter: Payer: Self-pay | Admitting: Student in an Organized Health Care Education/Training Program

## 2019-12-12 VITALS — BP 138/84 | HR 74 | Wt 217.0 lb

## 2019-12-12 DIAGNOSIS — B3731 Acute candidiasis of vulva and vagina: Secondary | ICD-10-CM

## 2019-12-12 DIAGNOSIS — H918X1 Other specified hearing loss, right ear: Secondary | ICD-10-CM | POA: Diagnosis not present

## 2019-12-12 DIAGNOSIS — B373 Candidiasis of vulva and vagina: Secondary | ICD-10-CM | POA: Diagnosis not present

## 2019-12-12 DIAGNOSIS — H9191 Unspecified hearing loss, right ear: Secondary | ICD-10-CM | POA: Diagnosis not present

## 2019-12-12 MED ORDER — FLUCONAZOLE 150 MG PO TABS: 150.0000 mg | ORAL_TABLET | Freq: Once | ORAL | 0 refills | Status: AC

## 2019-12-12 NOTE — Patient Instructions (Signed)
It was a pleasure to see you today!  To summarize our discussion for this visit:  For your hearing loss, I cannot see an abnormality that would cause this. So, I am referring you to ENT to follow up. Please let our clinic know if you do not hear from the referral in a week or two.  I am prescribing a treatment for a yeast infection following your antibiotics. If your symptoms do not improve, please let us know so we can get a urine sample/pelvic swab to test.  Some additional health maintenance measures we should update are: Health Maintenance Due  Topic Date Due  . INFLUENZA VACCINE  Never done  .    Please return to our clinic to see [place] as needed.  Call the clinic at [phone] if your symptoms worsen or you have any concerns.   Thank you for allowing me to take part in your care,  Dr. Jamelle Rushing

## 2019-12-12 NOTE — Progress Notes (Signed)
    SUBJECTIVE:   CHIEF COMPLAINT / HPI:  44 year old otherwise healthy female presents for continued hearing loss in the right ear for 2 weeks.  Sounds are muffled or distant.  Patient has been seen multiple times in person and with telemedicine for this problem which initially was significant for unilateral ear pain, drainage.  but those symptoms have improved with antibiotic treatment.  Latest presentation was to urgent care on 3/16 where the TM was not visualized due to otorrhea but TM rupture was suspected at that time.  Additionally, since patient completed course of antibiotics she has had 2 days of vaginal irritation including burning with urination.  Has increased clear discharge without odor.  Denies concerns for STIs or pregnancy.  Conductive hearing loss  PERTINENT  PMH / PSH: Otitis media treatment  OBJECTIVE:   BP 138/84   Pulse 74   Wt 217 lb (98.4 kg)   LMP 11/16/2019 (Approximate)   BMI 39.69 kg/m   Ears:  External ear exam shows no significant lesions or deformities.  Otoscopic examination reveals clear canals, tympanic membranes are intact bilaterally without bulging, retraction, inflammation or discharge. Hearing is grossly normal on left and has decreased conduction on right.  ASSESSMENT/PLAN:   Decreased hearing Likely sequelae from otitis media but has lasted greater than 2 weeks and has normal exam at this time status post antibiotic treatment. Refer to ENT  Vaginal candidiasis Suspected due to symptoms and recent history of antibiotic treatment. Exam not performed today. Prescribed Diflucan with instructions to patient to notify us if symptoms fail to resolve or worsen with this treatment as she will need to come in for further testing including pelvic exam and urinalysis for further diagnosis and treatment.     Leeroy Bock, DO [facility]

## 2019-12-15 DIAGNOSIS — B373 Candidiasis of vulva and vagina: Secondary | ICD-10-CM | POA: Insufficient documentation

## 2019-12-15 DIAGNOSIS — B3731 Acute candidiasis of vulva and vagina: Secondary | ICD-10-CM | POA: Insufficient documentation

## 2019-12-15 DIAGNOSIS — H9190 Unspecified hearing loss, unspecified ear: Secondary | ICD-10-CM | POA: Insufficient documentation

## 2019-12-15 NOTE — Assessment & Plan Note (Signed)
Likely sequelae from otitis media but has lasted greater than 2 weeks and has normal exam at this time status post antibiotic treatment. Refer to ENT

## 2019-12-15 NOTE — Assessment & Plan Note (Signed)
Suspected due to symptoms and recent history of antibiotic treatment. Exam not performed today. Prescribed Diflucan with instructions to patient to notify us if symptoms fail to resolve or worsen with this treatment as she will need to come in for further testing including pelvic exam and urinalysis for further diagnosis and treatment.

## 2019-12-22 ENCOUNTER — Emergency Department (HOSPITAL_COMMUNITY)
Admission: EM | Admit: 2019-12-22 | Discharge: 2019-12-22 | Disposition: A | Discharge status: Home / Self Care | Payer: Medicaid Other | Attending: Emergency Medicine | Admitting: Emergency Medicine

## 2019-12-22 ENCOUNTER — Other Ambulatory Visit: Payer: Self-pay

## 2019-12-22 ENCOUNTER — Emergency Department (HOSPITAL_COMMUNITY): Payer: Medicaid Other

## 2019-12-22 ENCOUNTER — Encounter (HOSPITAL_COMMUNITY): Payer: Self-pay | Admitting: Emergency Medicine

## 2019-12-22 DIAGNOSIS — N182 Chronic kidney disease, stage 2 (mild): Secondary | ICD-10-CM | POA: Diagnosis not present

## 2019-12-22 DIAGNOSIS — K5900 Constipation, unspecified: Secondary | ICD-10-CM | POA: Diagnosis not present

## 2019-12-22 DIAGNOSIS — K219 Gastro-esophageal reflux disease without esophagitis: Secondary | ICD-10-CM | POA: Insufficient documentation

## 2019-12-22 DIAGNOSIS — I129 Hypertensive chronic kidney disease with stage 1 through stage 4 chronic kidney disease, or unspecified chronic kidney disease: Secondary | ICD-10-CM | POA: Insufficient documentation

## 2019-12-22 DIAGNOSIS — I10 Essential (primary) hypertension: Secondary | ICD-10-CM | POA: Diagnosis not present

## 2019-12-22 DIAGNOSIS — Z79899 Other long term (current) drug therapy: Secondary | ICD-10-CM | POA: Insufficient documentation

## 2019-12-22 DIAGNOSIS — R140 Abdominal distension (gaseous): Secondary | ICD-10-CM | POA: Diagnosis not present

## 2019-12-22 LAB — CBC WITH DIFFERENTIAL/PLATELET
Abs Immature Granulocytes: 0.02 10*3/uL (ref 0.00–0.07)
Basophils Absolute: 0 10*3/uL (ref 0.0–0.1)
Basophils Relative: 1 %
Eosinophils Absolute: 0.4 10*3/uL (ref 0.0–0.5)
Eosinophils Relative: 6 %
HCT: 41.4 % (ref 36.0–46.0)
Hemoglobin: 13.6 g/dL (ref 12.0–15.0)
Immature Granulocytes: 0 %
Lymphocytes Relative: 12 %
Lymphs Abs: 0.9 10*3/uL (ref 0.7–4.0)
MCH: 29.1 pg (ref 26.0–34.0)
MCHC: 32.9 g/dL (ref 30.0–36.0)
MCV: 88.7 fL (ref 80.0–100.0)
Monocytes Absolute: 0.2 10*3/uL (ref 0.1–1.0)
Monocytes Relative: 3 %
Neutro Abs: 5.8 10*3/uL (ref 1.7–7.7)
Neutrophils Relative %: 78 %
Platelets: 328 10*3/uL (ref 150–400)
RBC: 4.67 MIL/uL (ref 3.87–5.11)
RDW: 13.9 % (ref 11.5–15.5)
WBC: 7.4 10*3/uL (ref 4.0–10.5)
nRBC: 0 % (ref 0.0–0.2)

## 2019-12-22 LAB — COMPREHENSIVE METABOLIC PANEL
ALT: 20 U/L (ref 0–44)
AST: 19 U/L (ref 15–41)
Albumin: 4.1 g/dL (ref 3.5–5.0)
Alkaline Phosphatase: 62 U/L (ref 38–126)
Anion gap: 13 (ref 5–15)
BUN: 10 mg/dL (ref 6–20)
CO2: 27 mmol/L (ref 22–32)
Calcium: 10.2 mg/dL (ref 8.9–10.3)
Chloride: 98 mmol/L (ref 98–111)
Creatinine, Ser: 0.81 mg/dL (ref 0.44–1.00)
GFR calc Af Amer: >60 mL/min (ref >60)
GFR calc non Af Amer: >60 mL/min (ref >60)
Glucose, Bld: 149 mg/dL — ABNORMAL HIGH (ref 70–99)
Potassium: 3.1 mmol/L — ABNORMAL LOW (ref 3.5–5.1)
Sodium: 138 mmol/L (ref 135–145)
Total Bilirubin: 0.3 mg/dL (ref 0.3–1.2)
Total Protein: 9.1 g/dL — ABNORMAL HIGH (ref 6.5–8.1)

## 2019-12-22 LAB — I-STAT BETA HCG BLOOD, ED (MC, WL, AP ONLY): I-stat hCG, quantitative: <5 m[IU]/mL (ref <5)

## 2019-12-22 LAB — D-DIMER, QUANTITATIVE: D-Dimer, Quant: <0.27 ug/mL-FEU (ref 0.00–0.50)

## 2019-12-22 LAB — TSH: TSH: 2.181 u[IU]/mL (ref 0.350–4.500)

## 2019-12-22 LAB — LIPASE, BLOOD: Lipase: 22 U/L (ref 11–51)

## 2019-12-22 MED ORDER — ACETAMINOPHEN 500 MG PO TABS
1000.0000 mg | ORAL_TABLET | Freq: Once | ORAL | Status: AC
  Administered 2019-12-22: 16:00:00 1000 mg via ORAL
  Filled 2019-12-22: qty 2

## 2019-12-22 MED ORDER — SODIUM CHLORIDE 0.9 % IV BOLUS
500.0000 mL | Freq: Once | INTRAVENOUS | Status: AC
  Administered 2019-12-22: 500 mL via INTRAVENOUS

## 2019-12-22 MED ORDER — SODIUM CHLORIDE 0.9 % IV BOLUS
1000.0000 mL | Freq: Once | INTRAVENOUS | Status: AC
  Administered 2019-12-22: 1000 mL via INTRAVENOUS

## 2019-12-22 MED ORDER — PANTOPRAZOLE SODIUM 40 MG IV SOLR
40.0000 mg | Freq: Once | INTRAVENOUS | Status: AC
  Administered 2019-12-22: 40 mg via INTRAVENOUS
  Filled 2019-12-22: qty 40

## 2019-12-22 MED ORDER — POTASSIUM CHLORIDE CRYS ER 20 MEQ PO TBCR
40.0000 meq | EXTENDED_RELEASE_TABLET | Freq: Once | ORAL | Status: AC
  Administered 2019-12-22: 14:00:00 40 meq via ORAL
  Filled 2019-12-22: qty 2

## 2019-12-22 MED ORDER — ALUM & MAG HYDROXIDE-SIMETH 200-200-20 MG/5ML PO SUSP
30.0000 mL | Freq: Once | ORAL | Status: AC
  Administered 2019-12-22: 10:00:00 30 mL via ORAL
  Filled 2019-12-22: qty 30

## 2019-12-22 NOTE — ED Provider Notes (Signed)
Patient care assumed at 1600.  Pt here for evaluation of frequent belching since last night.  Labs, xray are unremarkable. She has persistent tachycardia despite IVF.  Plan to check TSH, recheck after additional IVF.    TSH is within normal limits. D dimer was obtained, which is negative. Tachycardia did improve with IV fluid hydration. Patient belching has improved. She does complain of constipation. Discussed with patient home care for constipation. Discussed outpatient follow-up and return precautions.   Tilden Fossa, MD 12/22/19 1745

## 2019-12-22 NOTE — ED Notes (Signed)
Food and beverage at bedside per 'PO challenge order

## 2019-12-22 NOTE — Discharge Instructions (Signed)
Use a fleets enema, available over-the-counter when you get home tonight. You may also use MiraLAX, available over-the-counter for constipation. You may take 2 to 3 doses of the MiraLAX this evening. You can also start taking Colace, available over-the-counter for constipation. Get rechecked if you develop severe pain, fevers or new concerning symptoms.

## 2019-12-22 NOTE — ED Triage Notes (Signed)
Pt reports hx of acid reflux that she takes prilosec for sometimes. States she started having excessive belching last night, took 2 prilosec with no relief. Pt continuously belching in triage. She denies chest or abdominal pain.

## 2019-12-22 NOTE — ED Provider Notes (Signed)
[facility] EMERGENCY DEPARTMENT Provider Note   CSN: [id number] Arrival date & time: 12/22/19  0626     History Chief Complaint  Patient presents with  . Gas    Tamara Church is a 44 y.o. female.  Patient is a 44 year old female with a history of hypertension who presents with reflux and belching.  She said that she ate some spicy chicken dip last night and since that time she has had trouble with increased acid reflux and a lot of belching.  She denies any abdominal pain.  No nausea or vomiting.  She has had similar reflux in the past.  She is taken Prilosec without improvement in symptoms.  She denies any chest pain or shortness of breath.        Past Medical History:  Diagnosis Date  . Anemia   . Hypertension     Patient Active Problem List   Diagnosis Date Noted  . Decreased hearing 12/15/2019  . Vaginal candidiasis 12/15/2019  . Encounter for insertion of intrauterine contraceptive device (IUD) 07/25/2019  . Healthcare maintenance 07/14/2019  . Rhinosinusitis 10/22/2016  . Scabies 03/23/2012  . CKD (chronic kidney disease) stage 2, GFR 60-89 ml/min 03/21/2012  . Encounter for counseling regarding contraception 01/31/2012  . Encounter for IUD insertion 06/08/2011  . Breast engorgement 04/22/2011  . ANEMIA 09/07/2008  . GERD 09/04/2008  . OBESITY, NOS 11/18/2006  . HYPERTENSION, BENIGN SYSTEMIC 11/18/2006    Past Surgical History:  Procedure Laterality Date  . NO PAST SURGERIES       OB History    Gravida  6   Para  6   Term  6   Preterm  0   AB  0   Living  6     SAB  0   TAB  0   Ectopic  0   Multiple  0   Live Births              Family History  Problem Relation Age of Onset  . Hypertension Mother     Social History   Tobacco Use  . Smoking status: Never Smoker  . Smokeless tobacco: Never Used  Substance Use Topics  . Alcohol use: No  . Drug use: No    Home Medications Prior to Admission  medications   Medication Sig Start Date End Date Taking? Authorizing Provider  amLODipine (NORVASC) 10 MG tablet TAKE 1 TABLET BY MOUTH AT BEDTIME 05/17/19  Yes Winfrey, Alcario Drought, MD  hydrochlorothiazide (HYDRODIURIL) 25 MG tablet Take 1 tablet (25 mg total) by mouth daily. 07/14/19  Yes Winfrey, Alcario Drought, MD  levocetirizine (XYZAL) 5 MG tablet Take 1 tablet (5 mg total) by mouth every evening. 12/04/19 01/03/20 Yes Bonnita Hollow, MD  levonorgestrel (MIRENA) 20 MCG/24HR IUD 1 each by Intrauterine route once.   Yes [provider]  omeprazole (PRILOSEC OTC) 20 MG tablet Take 20 mg by mouth as needed (acid reflux).   Yes [provider]  azelastine (ASTELIN) 0.1 % nasal spray Place 2 sprays into both nostrils 2 (two) times daily. Use in each nostril as directed Patient not taking: Reported on 12/22/2019 12/04/19   Bonnita Hollow, MD  famotidine (PEPCID) 20 MG tablet Take 1 tablet (20 mg total) by mouth 2 (two) times daily. Patient not taking: Reported on 12/22/2019 07/14/19   Kathrene Alu, MD  fluticasone [facility]) 50 MCG/ACT nasal spray Place 1 spray into both nostrils daily. Patient not taking: Reported on  12/22/2019 11/29/19 12/30/19  Fayrene Helper, PA-C    Allergies    Ace inhibitors  Review of Systems   Review of Systems  Constitutional: Negative for chills, diaphoresis, fatigue and fever.  HENT: Negative for congestion, rhinorrhea and sneezing.   Eyes: Negative.   Respiratory: Negative for cough, chest tightness and shortness of breath.   Cardiovascular: Negative for chest pain and leg swelling.  Gastrointestinal: Negative for abdominal pain, blood in stool, diarrhea, nausea and vomiting.       Reflux and belching  Genitourinary: Negative for difficulty urinating, flank pain, frequency and hematuria.  Musculoskeletal: Negative for arthralgias and back pain.  Skin: Negative for rash.  Neurological: Negative for dizziness, speech difficulty, weakness, numbness and  headaches.    Physical Exam Updated Vital Signs BP (!) 141/89   Pulse (!) 101   Temp 97.8 F (36.6 C) (Oral)   Resp (!) 23   Ht 5\' 1"  (1.549 m)   Wt 92.1 kg   LMP 12/15/2019   SpO2 99%   BMI 38.36 kg/m   Physical Exam Constitutional:      Appearance: She is well-developed.  HENT:     Head: Normocephalic and atraumatic.  Eyes:     Pupils: Pupils are equal, round, and reactive to light.  Cardiovascular:     Rate and Rhythm: Regular rhythm. Tachycardia present.     Heart sounds: Normal heart sounds.  Pulmonary:     Effort: Pulmonary effort is normal. No respiratory distress.     Breath sounds: Normal breath sounds. No wheezing or rales.  Chest:     Chest wall: No tenderness.  Abdominal:     General: Bowel sounds are normal.     Palpations: Abdomen is soft.     Tenderness: There is no abdominal tenderness. There is no guarding or rebound.  Musculoskeletal:        General: Normal range of motion.     Cervical back: Normal range of motion and neck supple.  Lymphadenopathy:     Cervical: No cervical adenopathy.  Skin:    General: Skin is warm and dry.     Findings: No rash.  Neurological:     Mental Status: She is alert and oriented to person, place, and time.     ED Results / Procedures / Treatments   Labs (all labs ordered are listed, but only abnormal results are displayed) Labs Reviewed  COMPREHENSIVE METABOLIC PANEL - Abnormal; Notable for the following components:      Result Value   Potassium 3.1 (*)    Glucose, Bld 149 (*)    Total Protein 9.1 (*)    All other components within normal limits  LIPASE, BLOOD  CBC WITH DIFFERENTIAL/PLATELET  TSH  I-STAT BETA HCG BLOOD, ED ([facility], [facility], [facility] ONLY)    EKG EKG Interpretation  Date/Time:  Friday December 22 2019 08:53:03 EDT Ventricular Rate:  119 PR Interval:    QRS Duration: 99 QT Interval:  298 QTC Calculation: 420 R Axis:   23 Text Interpretation: Sinus tachycardia Inferior infarct, age indeterminate  SINCE LAST TRACING HEART RATE HAS INCREASED Confirmed by [birth year]1982 [phone]) on 12/22/2019 10:14:13 AM   Radiology DG ABD ACUTE 2+V W 1V CHEST  Result Date: 12/22/2019 CLINICAL DATA:  Indigestion with abdominal bloating and belching EXAM: DG ABDOMEN ACUTE W/ 1V CHEST COMPARISON:  None. FINDINGS: AP chest: Small area of apparent scarring right upper lobe toward the apex. Lungs otherwise clear. Heart size and pulmonary vascularity are normal. No adenopathy. :  Supine and left lateral decubitus abdomen: There is fairly diffuse stool throughout the colon. There is no bowel dilatation or air-fluid level to suggest bowel obstruction. No free air. Intrauterine device positioned in mid pelvis. IMPRESSION: Fairly diffuse stool throughout colon. Question a degree of constipation. No bowel obstruction or free air. Intrauterine device in mid pelvis. No edema or airspace consolidation. Electronically Signed   By: Bretta Bang III M.D.   On: 12/22/2019 10:59    Procedures Procedures (including critical care time)  Medications Ordered in ED Medications  sodium chloride 0.9 % bolus 500 mL (has no administration in time range)  acetaminophen (TYLENOL) tablet 1,000 mg (has no administration in time range)  pantoprazole (PROTONIX) injection 40 mg (40 mg Intravenous Given 12/22/19 1013)  alum & mag hydroxide-simeth (MAALOX/MYLANTA) 200-200-20 MG/5ML suspension 30 mL (30 mLs Oral Given 12/22/19 1012)  sodium chloride 0.9 % bolus 1,000 mL (0 mLs Intravenous Stopped 12/22/19 1122)  sodium chloride 0.9 % bolus 500 mL (0 mLs Intravenous Stopped 12/22/19 1505)  potassium chloride SA (KLOR-CON) CR tablet 40 mEq (40 mEq Oral Given 12/22/19 1353)    ED Course  I have reviewed the triage vital signs and the nursing notes.  Pertinent labs & imaging results that were available during my care of the patient were reviewed by me and considered in my medical decision making (see chart for details).    MDM Rules/Calculators/A&P                       Patient is a 44 year old female who presents with GERD symptoms and burping.  She says it started after eating some spicy chicken dip.  She was given a [name] and Protonix.  She feels much better after this.  She has no abdominal pain.  She had acute abdominal series which shows some constipation but otherwise no acute abnormality.  She is persistently tachycardic however she was given some IV fluids and her heart rate has improved but it still around 105 and increases with movement.  She denies any pleuritic chest pain.  She has not had any shortness of breath.  She said she walked from her car into the ED with no shortness of breath.  She did walk to the bathroom a little bit ago and had some mild shortness of breath but none currently.  No chest pain or other symptoms that sound more concerning for ACS.  I have added on a TSH and will give her a little bit more fluids and reassess.  Dr. Madilyn Hook to take over care. Final Clinical Impression(s) / ED Diagnoses Final diagnoses:  Gastroesophageal reflux disease without esophagitis    Rx / DC Orders ED Discharge Orders    None       Rolan Bucco, MD 12/22/19 [phone]

## 2019-12-22 NOTE — ED Notes (Signed)
Pt ambulatory to and from restroom with steady gait 

## 2020-02-08 ENCOUNTER — Telehealth (INDEPENDENT_AMBULATORY_CARE_PROVIDER_SITE_OTHER): Payer: Medicaid Other | Admitting: Family Medicine

## 2020-02-08 ENCOUNTER — Other Ambulatory Visit: Payer: Self-pay

## 2020-02-08 DIAGNOSIS — N898 Other specified noninflammatory disorders of vagina: Secondary | ICD-10-CM

## 2020-02-08 DIAGNOSIS — J329 Chronic sinusitis, unspecified: Secondary | ICD-10-CM | POA: Diagnosis not present

## 2020-02-08 DIAGNOSIS — J302 Other seasonal allergic rhinitis: Secondary | ICD-10-CM | POA: Diagnosis not present

## 2020-02-08 DIAGNOSIS — J310 Chronic rhinitis: Secondary | ICD-10-CM | POA: Diagnosis not present

## 2020-02-08 MED ORDER — FLUCONAZOLE 150 MG PO TABS: 150.0000 mg | ORAL_TABLET | Freq: Once | ORAL | 0 refills | Status: AC

## 2020-02-08 MED ORDER — AZELASTINE HCL 0.1 % NA SOLN: 2.0000 | Freq: Two times a day (BID) | NASAL | 12 refills | Status: DC

## 2020-02-08 MED ORDER — LEVOCETIRIZINE DIHYDROCHLORIDE 5 MG PO TABS: 5.0000 mg | ORAL_TABLET | Freq: Every evening | ORAL | 0 refills | Status: DC

## 2020-02-08 NOTE — Progress Notes (Signed)
[facility] Telemedicine Visit  Patient consented to have virtual visit and was identified by name and date of birth. Method of visit: Video  Encounter participants: Patient: SAVONNA BIRCHMEIER - located at home Provider: Mirian Mo - located at [facility] Others (if applicable): none  Chief Complaint: Allergies and vaginal discharge  HPI:  Allergies She has noticed increased nasal congestion and sneezing for several days.  She is out of her current allergy medications.  She would like a refill of her levocetirizine and nasal spray.  Yeast infection She was recently given a course of Augmentin from her for sinusitis diagnosed at an ED visit.  She did not complete her course and decided to take the remainder of her Augmentin when her sinus allergies flared several days ago.  Following it 2 days of Augmentin, she has noticed an increase in white/yellow thin vaginal discharge accompanied by significant itching.  She notes that she has previously had yeast infections following other courses of antibiotics and like to be treated for yeast infection.  She currently has an IUD in for birth control.    ROS: per HPI  Pertinent PMHx: Rhinosinusitis, allergies  Exam:  General: Well-appearing woman walking around comfortably during our video encounter. Respiratory: Breathing comfortably on room air.  No respiratory distress Cardiac: Noncyanotic.  Assessment/Plan:  Vaginal discharge -Diflucan 150 mg sent to pharmacy -She was encouraged to return to clinic if this does not improve her symptoms as further samples/testing may be necessary  Seasonal allergies -Refilled levocetirizine -Refilled azelastine nasal spray    Time spent during visit with patient: 10 minutes

## 2020-02-08 NOTE — Assessment & Plan Note (Signed)
-  Diflucan 150 mg sent to pharmacy -She was encouraged to return to clinic if this does not improve her symptoms as further samples/testing may be necessary

## 2020-02-08 NOTE — Assessment & Plan Note (Signed)
-  Refilled levocetirizine -Refilled azelastine nasal spray

## 2020-03-04 ENCOUNTER — Ambulatory Visit (HOSPITAL_COMMUNITY)
Admission: EM | Admit: 2020-03-04 | Discharge: 2020-03-04 | Disposition: A | Discharge status: Home / Self Care | Payer: Medicaid Other | Attending: Family Medicine | Admitting: Family Medicine

## 2020-03-04 ENCOUNTER — Encounter (HOSPITAL_COMMUNITY): Payer: Self-pay | Admitting: Emergency Medicine

## 2020-03-04 ENCOUNTER — Encounter (HOSPITAL_COMMUNITY): Payer: Self-pay

## 2020-03-04 ENCOUNTER — Emergency Department (HOSPITAL_COMMUNITY)
Admission: EM | Admit: 2020-03-04 | Discharge: 2020-03-04 | Disposition: A | Discharge status: Home / Self Care | Payer: Medicaid Other | Attending: Emergency Medicine | Admitting: Emergency Medicine

## 2020-03-04 ENCOUNTER — Other Ambulatory Visit: Payer: Self-pay

## 2020-03-04 DIAGNOSIS — K219 Gastro-esophageal reflux disease without esophagitis: Secondary | ICD-10-CM | POA: Insufficient documentation

## 2020-03-04 DIAGNOSIS — Z888 Allergy status to other drugs, medicaments and biological substances status: Secondary | ICD-10-CM | POA: Diagnosis not present

## 2020-03-04 DIAGNOSIS — R509 Fever, unspecified: Secondary | ICD-10-CM | POA: Diagnosis not present

## 2020-03-04 DIAGNOSIS — Z79899 Other long term (current) drug therapy: Secondary | ICD-10-CM | POA: Insufficient documentation

## 2020-03-04 DIAGNOSIS — I129 Hypertensive chronic kidney disease with stage 1 through stage 4 chronic kidney disease, or unspecified chronic kidney disease: Secondary | ICD-10-CM | POA: Insufficient documentation

## 2020-03-04 DIAGNOSIS — Z7901 Long term (current) use of anticoagulants: Secondary | ICD-10-CM | POA: Diagnosis not present

## 2020-03-04 DIAGNOSIS — R0982 Postnasal drip: Secondary | ICD-10-CM | POA: Diagnosis not present

## 2020-03-04 DIAGNOSIS — J069 Acute upper respiratory infection, unspecified: Secondary | ICD-10-CM | POA: Diagnosis not present

## 2020-03-04 DIAGNOSIS — Z6837 Body mass index (BMI) 37.0-37.9, adult: Secondary | ICD-10-CM | POA: Insufficient documentation

## 2020-03-04 DIAGNOSIS — Z209 Contact with and (suspected) exposure to unspecified communicable disease: Secondary | ICD-10-CM | POA: Diagnosis not present

## 2020-03-04 DIAGNOSIS — Z793 Long term (current) use of hormonal contraceptives: Secondary | ICD-10-CM | POA: Insufficient documentation

## 2020-03-04 DIAGNOSIS — N182 Chronic kidney disease, stage 2 (mild): Secondary | ICD-10-CM | POA: Insufficient documentation

## 2020-03-04 DIAGNOSIS — R05 Cough: Secondary | ICD-10-CM | POA: Diagnosis not present

## 2020-03-04 DIAGNOSIS — E669 Obesity, unspecified: Secondary | ICD-10-CM | POA: Diagnosis not present

## 2020-03-04 DIAGNOSIS — E876 Hypokalemia: Secondary | ICD-10-CM | POA: Insufficient documentation

## 2020-03-04 DIAGNOSIS — R000 Tachycardia, unspecified: Secondary | ICD-10-CM | POA: Diagnosis not present

## 2020-03-04 DIAGNOSIS — Z20822 Contact with and (suspected) exposure to covid-19: Secondary | ICD-10-CM | POA: Diagnosis not present

## 2020-03-04 DIAGNOSIS — I1310 Hypertensive heart and chronic kidney disease without heart failure, with stage 1 through stage 4 chronic kidney disease, or unspecified chronic kidney disease: Secondary | ICD-10-CM | POA: Diagnosis not present

## 2020-03-04 DIAGNOSIS — R42 Dizziness and giddiness: Secondary | ICD-10-CM | POA: Diagnosis not present

## 2020-03-04 DIAGNOSIS — R070 Pain in throat: Secondary | ICD-10-CM | POA: Diagnosis not present

## 2020-03-04 LAB — CBC WITH DIFFERENTIAL/PLATELET
Abs Immature Granulocytes: 0.02 10*3/uL (ref 0.00–0.07)
Basophils Absolute: 0 10*3/uL (ref 0.0–0.1)
Basophils Relative: 1 %
Eosinophils Absolute: 0 10*3/uL (ref 0.0–0.5)
Eosinophils Relative: 0 %
HCT: 38.1 % (ref 36.0–46.0)
Hemoglobin: 12.6 g/dL (ref 12.0–15.0)
Immature Granulocytes: 0 %
Lymphocytes Relative: 13 %
Lymphs Abs: 0.7 10*3/uL (ref 0.7–4.0)
MCH: 29.3 pg (ref 26.0–34.0)
MCHC: 33.1 g/dL (ref 30.0–36.0)
MCV: 88.6 fL (ref 80.0–100.0)
Monocytes Absolute: 0.4 10*3/uL (ref 0.1–1.0)
Monocytes Relative: 8 %
Neutro Abs: 4.3 10*3/uL (ref 1.7–7.7)
Neutrophils Relative %: 78 %
Platelets: 280 10*3/uL (ref 150–400)
RBC: 4.3 MIL/uL (ref 3.87–5.11)
RDW: 13.9 % (ref 11.5–15.5)
WBC: 5.4 10*3/uL (ref 4.0–10.5)
nRBC: 0 % (ref 0.0–0.2)

## 2020-03-04 LAB — BASIC METABOLIC PANEL
Anion gap: 13 (ref 5–15)
BUN: 8 mg/dL (ref 6–20)
CO2: 26 mmol/L (ref 22–32)
Calcium: 9.7 mg/dL (ref 8.9–10.3)
Chloride: 98 mmol/L (ref 98–111)
Creatinine, Ser: 0.75 mg/dL (ref 0.44–1.00)
GFR calc Af Amer: >60 mL/min (ref >60)
GFR calc non Af Amer: >60 mL/min (ref >60)
Glucose, Bld: 111 mg/dL — ABNORMAL HIGH (ref 70–99)
Potassium: 2.9 mmol/L — ABNORMAL LOW (ref 3.5–5.1)
Sodium: 137 mmol/L (ref 135–145)

## 2020-03-04 MED ORDER — PROMETHAZINE-DM 6.25-15 MG/5ML PO SYRP: 5.0000 mL | ORAL_SOLUTION | Freq: Four times a day (QID) | ORAL | 0 refills | Status: DC | PRN

## 2020-03-04 MED ORDER — SODIUM CHLORIDE 0.9 % IV BOLUS
1000.0000 mL | Freq: Once | INTRAVENOUS | Status: AC
  Administered 2020-03-04: 1000 mL via INTRAVENOUS

## 2020-03-04 MED ORDER — ACETAMINOPHEN 325 MG PO TABS
650.0000 mg | ORAL_TABLET | Freq: Once | ORAL | Status: AC
  Administered 2020-03-04: 650 mg via ORAL
  Filled 2020-03-04: qty 2

## 2020-03-04 MED ORDER — POTASSIUM CHLORIDE 10 MEQ/100ML IV SOLN
10.0000 meq | Freq: Once | INTRAVENOUS | Status: AC
  Administered 2020-03-04: 10 meq via INTRAVENOUS
  Filled 2020-03-04: qty 100

## 2020-03-04 MED ORDER — POTASSIUM CHLORIDE CRYS ER 20 MEQ PO TBCR
40.0000 meq | EXTENDED_RELEASE_TABLET | Freq: Once | ORAL | Status: AC
  Administered 2020-03-04: 40 meq via ORAL
  Filled 2020-03-04: qty 2

## 2020-03-04 NOTE — Discharge Instructions (Addendum)
Please return for any problem.  Follow-up with your regular care provider as instructed.  On testing performed today your potassium was mildly lower than normal.  This requires a recheck by your primary care provider within the next 2 weeks.  You may need additional potassium supplementation if your potassium levels remain low.

## 2020-03-04 NOTE — ED Notes (Signed)
EMS 20 g in left hand; 500 ml bolus NS given.

## 2020-03-04 NOTE — ED Provider Notes (Signed)
[facility]-EMERGENCY DEPT Provider Note   CSN: [id number] Arrival date & time: 03/04/20  1750     History Chief Complaint  Patient presents with  . Dizziness    KLOHE LOVERING is a 44 y.o. female.  44 year old female with prior medical history as detailed below presents for evaluation of malaise, fatigue, achiness, rhinorrhea, mild cough, and subjective fever.  Patient reports symptoms over the last 3 days.  She reports Covid infection requiring hospitalization approximately 11 months prior.  She did not receive the Covid vaccine afterwards.  She reports seeing seen by urgent care today.  Urgent care is performed a Covid test that is pending.  Patient arrives by EMS reporting feeling dizzy especially with standing.  She does report decreased p.o. intake over the last 2 to 3 days secondary to feeling bad.  The history is provided by the patient and medical records.  Dizziness Quality:  Lightheadedness Severity:  Mild Onset quality:  Gradual Duration:  3 days Timing:  Intermittent Progression:  Waxing and waning Chronicity:  New Context: standing up   Relieved by:  Nothing Worsened by:  Nothing      Past Medical History:  Diagnosis Date  . Anemia   . Hypertension     Patient Active Problem List   Diagnosis Date Noted  . Vaginal discharge 02/08/2020  . Seasonal allergies 02/08/2020  . Decreased hearing 12/15/2019  . Vaginal candidiasis 12/15/2019  . Encounter for insertion of intrauterine contraceptive device (IUD) 07/25/2019  . Healthcare maintenance 07/14/2019  . Rhinosinusitis 10/22/2016  . CKD (chronic kidney disease) stage 2, GFR 60-89 ml/min 03/21/2012  . Encounter for counseling regarding contraception 01/31/2012  . ANEMIA 09/07/2008  . GERD 09/04/2008  . OBESITY, NOS 11/18/2006  . HYPERTENSION, BENIGN SYSTEMIC 11/18/2006    Past Surgical History:  Procedure Laterality Date  . NO PAST SURGERIES       OB History    Gravida  6     Para  6   Term  6   Preterm  0   AB  0   Living  6     SAB  0   TAB  0   Ectopic  0   Multiple  0   Live Births              Family History  Problem Relation Age of Onset  . Hypertension Mother     Social History   Tobacco Use  . Smoking status: Never Smoker  . Smokeless tobacco: Never Used  Substance Use Topics  . Alcohol use: No  . Drug use: No    Home Medications Prior to Admission medications   Medication Sig Start Date End Date Taking? Authorizing Provider  amLODipine (NORVASC) 10 MG tablet TAKE 1 TABLET BY MOUTH AT BEDTIME 05/17/19   Lennox Solders, MD  azelastine (ASTELIN) 0.1 % nasal spray Place 2 sprays into both nostrils 2 (two) times daily. Use in each nostril as directed 02/08/20   Mirian Mo, MD  famotidine (PEPCID) 20 MG tablet Take 1 tablet (20 mg total) by mouth 2 (two) times daily. Patient not taking: Reported on 12/22/2019 07/14/19   Lennox Solders, MD  fluticasone [facility]) 50 MCG/ACT nasal spray Place 1 spray into both nostrils daily. Patient not taking: Reported on 12/22/2019 11/29/19 12/30/19  Fayrene Helper, PA-C  hydrochlorothiazide (HYDRODIURIL) 25 MG tablet Take 1 tablet (25 mg total) by mouth daily. 07/14/19   Lennox Solders, MD  levocetirizine (XYZAL) 5  MG tablet Take 1 tablet (5 mg total) by mouth every evening. 02/08/20 03/09/20  Matilde Haymaker, MD  levonorgestrel (MIRENA) 20 MCG/24HR IUD 1 each by Intrauterine route once.    [provider]  omeprazole (PRILOSEC OTC) 20 MG tablet Take 20 mg by mouth as needed (acid reflux).    [provider]  promethazine-dextromethorphan (PROMETHAZINE-DM) 6.25-15 MG/5ML syrup Take 5 mLs by mouth 4 (four) times daily as needed for cough. 03/04/20   Raylene Everts, MD    Allergies    Ace inhibitors  Review of Systems   Review of Systems  Neurological: Positive for dizziness and light-headedness.  All other systems reviewed and are negative.   Physical Exam Updated  Vital Signs BP 132/79   Pulse (!) 106   Temp 98.4 F (36.9 C) (Oral)   SpO2 100%   Physical Exam Vitals and nursing note reviewed.  Constitutional:      General: She is not in acute distress.    Appearance: She is well-developed.  HENT:     Head: Normocephalic and atraumatic.  Eyes:     Conjunctiva/sclera: Conjunctivae normal.     Pupils: Pupils are equal, round, and reactive to light.  Cardiovascular:     Rate and Rhythm: Normal rate and regular rhythm.     Heart sounds: Normal heart sounds.  Pulmonary:     Effort: Pulmonary effort is normal. No respiratory distress.     Breath sounds: Normal breath sounds.  Abdominal:     General: There is no distension.     Palpations: Abdomen is soft.     Tenderness: There is no abdominal tenderness.  Musculoskeletal:        General: No deformity. Normal range of motion.     Cervical back: Normal range of motion and neck supple.  Skin:    General: Skin is warm and dry.  Neurological:     General: No focal deficit present.     Mental Status: She is alert and oriented to person, place, and time. Mental status is at baseline.     Cranial Nerves: No cranial nerve deficit.     Sensory: No sensory deficit.     Motor: No weakness.     Coordination: Coordination normal.     Gait: Gait normal.     ED Results / Procedures / Treatments   Labs (all labs ordered are listed, but only abnormal results are displayed) Labs Reviewed  BASIC METABOLIC PANEL - Abnormal; Notable for the following components:      Result Value   Potassium 2.9 (*)    Glucose, Bld 111 (*)    All other components within normal limits  CBC WITH DIFFERENTIAL/PLATELET    EKG EKG Interpretation  Date/Time:  Monday March 04 2020 18:23:36 EDT Ventricular Rate:  103 PR Interval:    QRS Duration: 99 QT Interval:  348 QTC Calculation: 456 R Axis:   40 Text Interpretation: Sinus tachycardia Low voltage, precordial leads Borderline T wave abnormalities Baseline wander  in lead(s) V5 V6 Confirmed by Dene Gentry [phone]) on 03/04/2020 6:43:26 PM   Radiology No results found.  Procedures Procedures (including critical care time)  Medications Ordered in ED Medications  acetaminophen (TYLENOL) tablet 650 mg (has no administration in time range)  potassium chloride 10 mEq in 100 mL IVPB (has no administration in time range)  potassium chloride SA (KLOR-CON) CR tablet 40 mEq (has no administration in time range)  sodium chloride 0.9 % bolus 1,000 mL (1,000 mLs Intravenous  New Bag/Given 03/04/20 1839)    ED Course  I have reviewed the triage vital signs and the nursing notes.  Pertinent labs & imaging results that were available during my care of the patient were reviewed by me and considered in my medical decision making (see chart for details).    MDM Rules/Calculators/A&P                          MDM  Screen complete  LATAUSHA FLAMM was evaluated in Emergency Department on 03/04/2020 for the symptoms described in the history of present illness. She was evaluated in the context of the global COVID-19 pandemic, which necessitated consideration that the patient might be at risk for infection with the SARS-CoV-2 virus that causes COVID-19. Institutional protocols and algorithms that pertain to the evaluation of patients at risk for COVID-19 are in a state of rapid change based on information released by regulatory bodies including the CDC and federal and state organizations. These policies and algorithms were followed during the patient's care in the ED.  Patient is presenting for evaluation of likely mild dehydration in the setting of a mild URI.  Screening labs revealed hypokalemia.  This was treated.  Patient feels significantly improved following IV fluids given in the ED.  Importance of close follow-up is stressed.  Strict return precautions given and understood.   Final Clinical Impression(s) / ED Diagnoses Final diagnoses:  Hypokalemia     Rx / DC Orders ED Discharge Orders    None       Wynetta Fines, MD 03/04/20 2118

## 2020-03-04 NOTE — Discharge Instructions (Addendum)
You were prescribed astelin nasal spray and levocetirizine last month for allergies These can still be used Use phenergan DM for the cough Rest Drink lots of water

## 2020-03-04 NOTE — ED Triage Notes (Signed)
Per EMS pt complaint of nasal congestion, sore throat; increased urine output and orthostatic dizziness. Pending COVID test.

## 2020-03-04 NOTE — ED Triage Notes (Signed)
Pt /o non productive cough, sore throat, chest congestionx2 days. Pt denies SOB. Pt has non labored breathing. Skin color WNL.

## 2020-03-04 NOTE — ED Provider Notes (Signed)
[facility]    CSN: [id number] Arrival date & time: 03/04/20  1318      History   Chief Complaint Chief Complaint  Patient presents with  . Cough    HPI Tamara Church is a 44 y.o. female.   HPI  Patient is here with a cold She states she got up from her grandson who visited over the weekend She states she has had coronavirus previously, and this does not feel the same She has not had Covid vaccinations No fever chills, no body aches, no headache, no loss of test or smell She does have some chest congestion and coughing.  Feels like it might be from her postnasal drip.  No chest pain, no shortness of breath, no sputum production She is here because she does not know what to take for her cold.  She states she has hypertension.  When she takes Mucinex DM it makes her feel "drunk".  Past Medical History:  Diagnosis Date  . Anemia   . Hypertension     Patient Active Problem List   Diagnosis Date Noted  . Vaginal discharge 02/08/2020  . Seasonal allergies 02/08/2020  . Decreased hearing 12/15/2019  . Vaginal candidiasis 12/15/2019  . Encounter for insertion of intrauterine contraceptive device (IUD) 07/25/2019  . Healthcare maintenance 07/14/2019  . Rhinosinusitis 10/22/2016  . CKD (chronic kidney disease) stage 2, GFR 60-89 ml/min 03/21/2012  . Encounter for counseling regarding contraception 01/31/2012  . ANEMIA 09/07/2008  . GERD 09/04/2008  . OBESITY, NOS 11/18/2006  . HYPERTENSION, BENIGN SYSTEMIC 11/18/2006    Past Surgical History:  Procedure Laterality Date  . NO PAST SURGERIES      OB History    Gravida  6   Para  6   Term  6   Preterm  0   AB  0   Living  6     SAB  0   TAB  0   Ectopic  0   Multiple  0   Live Births               Home Medications    Prior to Admission medications   Medication Sig Start Date End Date Taking? Authorizing Provider  amLODipine (NORVASC) 10 MG tablet TAKE 1 TABLET BY MOUTH AT  BEDTIME 05/17/19   Lennox Solders, MD  azelastine (ASTELIN) 0.1 % nasal spray Place 2 sprays into both nostrils 2 (two) times daily. Use in each nostril as directed 02/08/20   Mirian Mo, MD  famotidine (PEPCID) 20 MG tablet Take 1 tablet (20 mg total) by mouth 2 (two) times daily. Patient not taking: Reported on 12/22/2019 07/14/19   Lennox Solders, MD  fluticasone [facility]) 50 MCG/ACT nasal spray Place 1 spray into both nostrils daily. Patient not taking: Reported on 12/22/2019 11/29/19 12/30/19  Fayrene Helper, PA-C  hydrochlorothiazide (HYDRODIURIL) 25 MG tablet Take 1 tablet (25 mg total) by mouth daily. 07/14/19   Lennox Solders, MD  levocetirizine (XYZAL) 5 MG tablet Take 1 tablet (5 mg total) by mouth every evening. 02/08/20 03/09/20  Mirian Mo, MD  levonorgestrel (MIRENA) 20 MCG/24HR IUD 1 each by Intrauterine route once.    [provider]  omeprazole (PRILOSEC OTC) 20 MG tablet Take 20 mg by mouth as needed (acid reflux).    [provider]  promethazine-dextromethorphan (PROMETHAZINE-DM) 6.25-15 MG/5ML syrup Take 5 mLs by mouth 4 (four) times daily as needed for cough. 03/04/20   Eustace Moore, MD  Family History Family History  Problem Relation Age of Onset  . Hypertension Mother     Social History Social History   Tobacco Use  . Smoking status: Never Smoker  . Smokeless tobacco: Never Used  Substance Use Topics  . Alcohol use: No  . Drug use: No     Allergies   Ace inhibitors   Review of Systems Review of Systems  Constitutional: Negative for chills and fever.  HENT: Positive for congestion and postnasal drip.   Respiratory: Positive for cough. Negative for shortness of breath.      Physical Exam Triage Vital Signs ED Triage Vitals [03/04/20 1348]  Enc Vitals Group     BP (!) 134/92     Pulse Rate (!) 114     Resp 18     Temp 98.8 F (37.1 C)     Temp Source Oral     SpO2 99 %     Weight 189 lb (85.7 kg)     Height 5\' 1"   (1.549 m)     Head Circumference      Peak Flow      Pain Score 9     Pain Loc      Pain Edu?      Excl. in [place]?    No data found.  Updated Vital Signs BP (!) 134/92   Pulse (!) 114   Temp 98.8 F (37.1 C) (Oral)   Resp 18   Ht 5\' 1"  (1.549 m)   Wt 85.7 kg   SpO2 99%   BMI 35.71 kg/m     Physical Exam Constitutional:      General: She is not in acute distress.    Appearance: She is well-developed.  HENT:     Head: Normocephalic and atraumatic.     Right Ear: Tympanic membrane and ear canal normal.     Left Ear: Tympanic membrane and ear canal normal.     Nose: No congestion.     Mouth/Throat:     Mouth: Mucous membranes are moist.     Pharynx: No posterior oropharyngeal erythema.  Eyes:     Conjunctiva/sclera: Conjunctivae normal.     Pupils: Pupils are equal, round, and reactive to light.  Cardiovascular:     Rate and Rhythm: Normal rate and regular rhythm.     Heart sounds: Normal heart sounds.  Pulmonary:     Effort: Pulmonary effort is normal. No respiratory distress.     Breath sounds: Normal breath sounds.     Comments: Lungs are clear Abdominal:     General: There is no distension.     Palpations: Abdomen is soft.  Musculoskeletal:        General: Normal range of motion.     Cervical back: Normal range of motion and neck supple.  Skin:    General: Skin is warm and dry.  Neurological:     Mental Status: She is alert.  Psychiatric:        Mood and Affect: Mood normal.        Behavior: Behavior normal.      UC Treatments / Results  Labs (all labs ordered are listed, but only abnormal results are displayed) Labs Reviewed  SARS CORONAVIRUS 2 (TAT 6-24 HRS)    EKG   Radiology No results found.  Procedures Procedures (including critical care time)  Medications Ordered in UC Medications - No data to display  Initial Impression / Assessment and Plan / UC Course  I have reviewed  the triage vital signs and the nursing notes.  Pertinent  labs & imaging results that were available during my care of the patient were reviewed by me and considered in my medical decision making (see chart for details).     Reviewed that it safe to take Coricidin H BP.  Allergy medicine like Zyrtec, Allegra, or will prescribe Xyzal.  Over-the-counter cough medicine like Robitussin is fine as is Mucinex DM.  Rest.  Push fluids.  Return as needed Final Clinical Impressions(s) / UC Diagnoses   Final diagnoses:  Viral URI with cough     Discharge Instructions     You were prescribed astelin nasal spray and levocetirizine last month for allergies These can still be used Use phenergan DM for the cough Rest Drink lots of water   ED Prescriptions    Medication Sig Dispense Auth. Provider   promethazine-dextromethorphan (PROMETHAZINE-DM) 6.25-15 MG/5ML syrup Take 5 mLs by mouth 4 (four) times daily as needed for cough. 118 mL Eustace Moore, MD     PDMP not reviewed this encounter.   Eustace Moore, MD 03/04/20 [phone]

## 2020-03-05 LAB — SARS CORONAVIRUS 2 (TAT 6-24 HRS): SARS Coronavirus 2: NEGATIVE

## 2020-03-07 ENCOUNTER — Ambulatory Visit: Payer: Medicaid Other | Admitting: Family Medicine

## 2020-03-07 NOTE — Progress Notes (Deleted)
   Subjective:   Patient ID: Tamara Church    DOB: 1976-09-17, 45 y.o. female   MRN: [id number]  Tamara Church is a 44 y.o. female with a history of HTN, GERD, CKD II, anemia, obesity, seasonal allergies here for hospital follow up.  Hospital Follow up: She presented to the ED on 6/14 due to symptoms of malaise, fatigue, achiness, rhinorrhea, mild cough, and subjective fever. She had decreased PO intake due to feeling bad. Patient presents today for [name] after being found to have hypokalemia of 2.9. Patietn received 50 mEq of potassium and IV fluids with improvement in her symptoms. She has never had hypokalemia before. Today she notes *** She was tested for COVID which was negative. CBC was WNL. Remaining of BMP was WNL. Meds that could have contributed include HCTC***  Review of Systems:  Per HPI.   Objective:   There were no vitals taken for this visit. Vitals and nursing note reviewed.  General: well nourished, well developed, in no acute distress with non-toxic appearance HEENT: normocephalic, atraumatic, moist mucous membranes Neck: supple, non-tender without lymphadenopathy CV: regular rate and rhythm without murmurs, rubs, or gallops, no lower extremity edema Lungs: clear to auscultation bilaterally with normal work of breathing Abdomen: soft, non-tender, non-distended, no masses or organomegaly palpable, normoactive bowel sounds Skin: warm, dry, no rashes or lesions Extremities: warm and well perfused, normal tone MSK: ROM grossly intact, strength intact, gait normal Neuro: Alert and oriented, speech normal  Assessment & Plan:   No problem-specific Assessment & Plan notes found for this encounter.  No orders of the defined types were placed in this encounter.  No orders of the defined types were placed in this encounter.   Orpah Cobb, DO PGY-2, [facility] 03/07/2020 8:27 AM

## 2020-03-13 ENCOUNTER — Other Ambulatory Visit: Payer: Self-pay

## 2020-03-13 ENCOUNTER — Encounter (HOSPITAL_COMMUNITY): Payer: Self-pay | Admitting: Emergency Medicine

## 2020-03-13 ENCOUNTER — Emergency Department (HOSPITAL_COMMUNITY)
Admission: EM | Admit: 2020-03-13 | Discharge: 2020-03-13 | Disposition: A | Discharge status: Home / Self Care | Payer: Medicaid Other | Attending: Emergency Medicine | Admitting: Emergency Medicine

## 2020-03-13 DIAGNOSIS — R5383 Other fatigue: Secondary | ICD-10-CM | POA: Diagnosis not present

## 2020-03-13 DIAGNOSIS — I129 Hypertensive chronic kidney disease with stage 1 through stage 4 chronic kidney disease, or unspecified chronic kidney disease: Secondary | ICD-10-CM | POA: Diagnosis not present

## 2020-03-13 DIAGNOSIS — Z3202 Encounter for pregnancy test, result negative: Secondary | ICD-10-CM | POA: Diagnosis not present

## 2020-03-13 DIAGNOSIS — Z79899 Other long term (current) drug therapy: Secondary | ICD-10-CM | POA: Diagnosis not present

## 2020-03-13 DIAGNOSIS — E876 Hypokalemia: Secondary | ICD-10-CM | POA: Diagnosis not present

## 2020-03-13 DIAGNOSIS — N189 Chronic kidney disease, unspecified: Secondary | ICD-10-CM | POA: Insufficient documentation

## 2020-03-13 DIAGNOSIS — I10 Essential (primary) hypertension: Secondary | ICD-10-CM | POA: Diagnosis not present

## 2020-03-13 DIAGNOSIS — R197 Diarrhea, unspecified: Secondary | ICD-10-CM | POA: Diagnosis present

## 2020-03-13 LAB — CBC
HCT: 40.5 % (ref 36.0–46.0)
Hemoglobin: 13.2 g/dL (ref 12.0–15.0)
MCH: 29.3 pg (ref 26.0–34.0)
MCHC: 32.6 g/dL (ref 30.0–36.0)
MCV: 89.8 fL (ref 80.0–100.0)
Platelets: 309 10*3/uL (ref 150–400)
RBC: 4.51 MIL/uL (ref 3.87–5.11)
RDW: 13.8 % (ref 11.5–15.5)
WBC: 7.1 10*3/uL (ref 4.0–10.5)
nRBC: 0 % (ref 0.0–0.2)

## 2020-03-13 LAB — COMPREHENSIVE METABOLIC PANEL
ALT: 19 U/L (ref 0–44)
AST: 25 U/L (ref 15–41)
Albumin: 4.3 g/dL (ref 3.5–5.0)
Alkaline Phosphatase: 53 U/L (ref 38–126)
Anion gap: 15 (ref 5–15)
BUN: 12 mg/dL (ref 6–20)
CO2: 25 mmol/L (ref 22–32)
Calcium: 9.5 mg/dL (ref 8.9–10.3)
Chloride: 94 mmol/L — ABNORMAL LOW (ref 98–111)
Creatinine, Ser: 0.86 mg/dL (ref 0.44–1.00)
GFR calc Af Amer: >60 mL/min (ref >60)
GFR calc non Af Amer: >60 mL/min (ref >60)
Glucose, Bld: 130 mg/dL — ABNORMAL HIGH (ref 70–99)
Potassium: 3.3 mmol/L — ABNORMAL LOW (ref 3.5–5.1)
Sodium: 134 mmol/L — ABNORMAL LOW (ref 135–145)
Total Bilirubin: 0.8 mg/dL (ref 0.3–1.2)
Total Protein: 8.8 g/dL — ABNORMAL HIGH (ref 6.5–8.1)

## 2020-03-13 LAB — URINALYSIS, ROUTINE W REFLEX MICROSCOPIC
Bilirubin Urine: NEGATIVE
Glucose, UA: NEGATIVE mg/dL
Ketones, ur: 20 mg/dL — AB
Leukocytes,Ua: NEGATIVE
Nitrite: NEGATIVE
Protein, ur: NEGATIVE mg/dL
Specific Gravity, Urine: 1.01 (ref 1.005–1.030)
pH: 6 (ref 5.0–8.0)

## 2020-03-13 LAB — POC URINE PREG, ED: Preg Test, Ur: NEGATIVE

## 2020-03-13 MED ORDER — SODIUM CHLORIDE 0.9% FLUSH
3.0000 mL | Freq: Once | INTRAVENOUS | Status: AC
  Administered 2020-03-13: 3 mL via INTRAVENOUS

## 2020-03-13 MED ORDER — MAGNESIUM OXIDE 400 (241.3 MG) MG PO TABS
400.0000 mg | ORAL_TABLET | Freq: Once | ORAL | Status: AC
  Administered 2020-03-13: 400 mg via ORAL
  Filled 2020-03-13: qty 1

## 2020-03-13 MED ORDER — LACTATED RINGERS IV BOLUS
1000.0000 mL | Freq: Once | INTRAVENOUS | Status: AC
  Administered 2020-03-13: 1000 mL via INTRAVENOUS

## 2020-03-13 MED ORDER — POTASSIUM CHLORIDE CRYS ER 20 MEQ PO TBCR
40.0000 meq | EXTENDED_RELEASE_TABLET | Freq: Once | ORAL | Status: AC
  Administered 2020-03-13: 40 meq via ORAL
  Filled 2020-03-13: qty 2

## 2020-03-13 MED ORDER — FAMOTIDINE IN NACL 20-0.9 MG/50ML-% IV SOLN
20.0000 mg | Freq: Once | INTRAVENOUS | Status: AC
  Administered 2020-03-13: 20 mg via INTRAVENOUS
  Filled 2020-03-13: qty 50

## 2020-03-13 MED ORDER — KETOROLAC TROMETHAMINE 15 MG/ML IJ SOLN
15.0000 mg | Freq: Once | INTRAMUSCULAR | Status: AC
  Administered 2020-03-13: 15 mg via INTRAVENOUS
  Filled 2020-03-13: qty 1

## 2020-03-13 NOTE — ED Triage Notes (Signed)
Patient here from home reporting fatigue and diarrhea x2 days. Reports that she was seen before and had low potassium.

## 2020-03-13 NOTE — ED Notes (Signed)
Recollect needed for I-stat beta. RN made aware.

## 2020-03-14 ENCOUNTER — Ambulatory Visit (INDEPENDENT_AMBULATORY_CARE_PROVIDER_SITE_OTHER): Payer: Medicaid Other | Admitting: Family Medicine

## 2020-03-14 ENCOUNTER — Other Ambulatory Visit: Payer: Self-pay

## 2020-03-14 ENCOUNTER — Encounter: Payer: Self-pay | Admitting: Family Medicine

## 2020-03-14 VITALS — BP 132/84 | HR 92 | Ht 61.0 in | Wt 210.2 lb

## 2020-03-14 DIAGNOSIS — R638 Other symptoms and signs concerning food and fluid intake: Secondary | ICD-10-CM | POA: Diagnosis not present

## 2020-03-14 DIAGNOSIS — I10 Essential (primary) hypertension: Secondary | ICD-10-CM | POA: Diagnosis not present

## 2020-03-14 DIAGNOSIS — E876 Hypokalemia: Secondary | ICD-10-CM

## 2020-03-14 DIAGNOSIS — F4321 Adjustment disorder with depressed mood: Secondary | ICD-10-CM | POA: Diagnosis not present

## 2020-03-14 MED ORDER — SPIRONOLACTONE 25 MG PO TABS: 25.0000 mg | ORAL_TABLET | Freq: Every day | ORAL | 0 refills | Status: DC

## 2020-03-14 NOTE — Patient Instructions (Signed)
Today we talked at length about your issues with low potassium and blood pressure.  Were going to try starting a medicine called spironolactone at a low dose.  This medicine should control your blood pressure hopefully and is also known to actually increase potassium.  We talked about the risks of this with pregnancy and since you are on the Mirena IUD should not be a problem but I do not want you to forget this issue if you take the Mirena out in the future.  We also talked about your issues with eating since the death of your son, I know that you say that you are safe and that you have a support network around you for therapy reasons but that you are interested in talking to a nutritionist about your relationship with food.  I think this is a good idea in someone to give you a card to discuss with her.  I will record this in the computer that  she has an idea of what you are dealing with but I think this discussion with her might be helpful to you.  Please come back in 1 week to recheck your blood pressure and potassium, any of our physicians are capable of doing this  Dr. Parke Simmers

## 2020-03-14 NOTE — Assessment & Plan Note (Signed)
Patient has been on amlodipine 10 with no issues, has had multiple failures of second agent including ARB and ACE for cough and now HCTZ for recurring hypokalemia.  We did discuss potential of both beta-blocker and spironolactone, spironolactone chosen for potassium raising affect.  We did discuss potential teratogenic effect of this medication but patient is on Mirena and does not want to have more children.  Will come back in 1 week for follow-up potassium and blood pressure check.  We will stop HCTZ

## 2020-03-14 NOTE — Progress Notes (Signed)
    SUBJECTIVE:   CHIEF COMPLAINT / HPI: Hypertension and hypokalemia  HYPERTENSION, BENIGN SYSTEMIC Patient has been on amlodipine 10 with no issues, has had multiple failures of second agent including ARB and ACE for cough and now HCTZ for recurring hypokalemia.  Difficulty eating Patient recently experienced the death of a son, she has had trouble maintaining what she considers to be a full appetite during this time.  She does say that she is very safe and has no thoughts of hurting herself or would never do that.  She does say that she has an emotional support network around her and declines offer of therapy resources.  PERTINENT  PMH / PSH:   OBJECTIVE:   BP 132/84   Pulse 92   Ht 5\' 1"  (1.549 m)   Wt 210 lb 3.2 oz (95.3 kg)   LMP 03/11/2020 (Exact Date)   SpO2 100%   BMI 39.72 kg/m   General: Alert and pleasant, no medical distress Cardiac: Regular rate and rhythm, no murmur noted Pulmonary: No increased work of breathing, no cough, clear to auscultation Psych: Processing information appropriately, did become tearful when discussing the death of her son  ASSESSMENT/PLAN:   HYPERTENSION, BENIGN SYSTEMIC Patient has been on amlodipine 10 with no issues, has had multiple failures of second agent including ARB and ACE for cough and now HCTZ for recurring hypokalemia.  We did discuss potential of both beta-blocker and spironolactone, spironolactone chosen for potassium raising affect.  We did discuss potential teratogenic effect of this medication but patient is on Mirena and does not want to have more children.  Will come back in 1 week for follow-up potassium and blood pressure check.  We will stop HCTZ  Difficulty eating Patient recently experienced the death of a son, she has had trouble maintaining what she considers to be a full appetite during this time.  She does say that she is very safe and has no thoughts of hurting herself or would never do that.  She does say that  she has an emotional support network around her and declines offer of therapy resources.  She does think that it is a good idea for her to work on having a healthier response to food during her brief time and would like to speak to nutrition about this  Hypokalemia Has been consistently low since being on hydrochlorothiazide, will transition to spironolactone and recheck in 1 week at follow-up visit  Adjustment disorder with depressed mood Patient recently experienced the death of a son, she has had trouble maintaining what she considers to be a full appetite during this time.  She does say that she is very safe and has no thoughts of hurting herself or would never do that.  She does say that she has an emotional support network around her and declines offer of therapy resources.  She does think that it is a good idea for her to work on having a healthier response to food during her brief time and would like to speak to nutrition about this     03/13/2020, DO [facility]

## 2020-03-14 NOTE — Assessment & Plan Note (Signed)
Patient recently experienced the death of a son, she has had trouble maintaining what she considers to be a full appetite during this time.  She does say that she is very safe and has no thoughts of hurting herself or would never do that.  She does say that she has an emotional support network around her and declines offer of therapy resources.  She does think that it is a good idea for her to work on having a healthier response to food during her brief time and would like to speak to nutrition about this

## 2020-03-15 DIAGNOSIS — F4321 Adjustment disorder with depressed mood: Secondary | ICD-10-CM | POA: Insufficient documentation

## 2020-03-15 LAB — URINE CULTURE

## 2020-03-15 NOTE — Assessment & Plan Note (Signed)
Has been consistently low since being on hydrochlorothiazide, will transition to spironolactone and recheck in 1 week at follow-up visit

## 2020-03-15 NOTE — Assessment & Plan Note (Signed)
Patient recently experienced the death of a son, she has had trouble maintaining what she considers to be a full appetite during this time.  She does say that she is very safe and has no thoughts of hurting herself or would never do that.  She does say that she has an emotional support network around her and declines offer of therapy resources.  She does think that it is a good idea for her to work on having a healthier response to food during her brief time and would like to speak to nutrition about this 

## 2020-03-17 NOTE — ED Provider Notes (Signed)
[facility]EMER[facility]ENCY DEPT Provider Note   CSN: [id number] Arrival date & time: 03/13/20  1210     History Chief Complaint  Patient presents with  . Fatigue  . Diarrhea    Tamara Church is a 44 y.o. female.  HPI   43yF with generalized weakness and diarrhea. Feels lightheaded as if she may pass out. Denies room spinning sensation. Several loose stools. No blood. No vomiting. No fever. Occasional crampy abdominal pain. No urinary complaints. No sick contacts. Worried about her potassium level.   Past Medical History:  Diagnosis Date  . Anemia   . Hypertension     Patient Active Problem List   Diagnosis Date Noted  . Adjustment disorder with depressed mood 03/15/2020  . Difficulty eating 03/14/2020  . Seasonal allergies 02/08/2020  . Decreased hearing 12/15/2019  . Hypokalemia 04/03/2019  . CKD (chronic kidney disease) stage 2, GFR 60-89 ml/min 03/21/2012  . ANEMIA 09/07/2008  . GERD 09/04/2008  . OBESITY, NOS 11/18/2006  . HYPERTENSION, BENIGN SYSTEMIC 11/18/2006    Past Surgical History:  Procedure Laterality Date  . NO PAST SURGERIES       OB History    Gravida  6   Para  6   Term  6   Preterm  0   AB  0   Living  6     SAB  0   TAB  0   Ectopic  0   Multiple  0   Live Births              Family History  Problem Relation Age of Onset  . Hypertension Mother     Social History   Tobacco Use  . Smoking status: Never Smoker  . Smokeless tobacco: Never Used  Substance Use Topics  . Alcohol use: No  . Drug use: No    Home Medications Prior to Admission medications   Medication Sig Start Date End Date Taking? Authorizing Provider  amLODipine (NORVASC) 10 MG tablet TAKE 1 TABLET BY MOUTH AT BEDTIME Patient taking differently: Take 10 mg by mouth daily.  05/17/19  Yes Winfrey, Harlen Labs, MD  azelastine (ASTELIN) 0.1 % nasal spray Place 2 sprays into both nostrils 2 (two) times daily. Use in each nostril as  directed Patient taking differently: Place 2 sprays into both nostrils 2 (two) times daily as needed for rhinitis or allergies. Use in each nostril as directed 02/08/20  Yes Mirian Mo, MD  famotidine (PEPCID) 20 MG tablet Take 1 tablet (20 mg total) by mouth 2 (two) times daily. 07/14/19  Yes Winfrey, Harlen Labs, MD  levonorgestrel (MIRENA) 20 MCG/24HR IUD 1 each by Intrauterine route once.   Yes [provider]  fluticasone (FLONASE) 50 MCG/ACT nasal spray Place 1 spray into both nostrils daily. Patient not taking: Reported on 12/22/2019 11/29/19 12/30/19  Fayrene Helper, PA-C  levocetirizine (XYZAL) 5 MG tablet Take 1 tablet (5 mg total) by mouth every evening. Patient not taking: Reported on 03/13/2020 02/08/20 03/09/20  Mirian Mo, MD  promethazine-dextromethorphan (PROMETHAZINE-DM) 6.25-15 MG/5ML syrup Take 5 mLs by mouth 4 (four) times daily as needed for cough. Patient not taking: Reported on 03/13/2020 03/04/20   Eustace Moore, MD  spironolactone (ALDACTONE) 25 MG tablet Take 1 tablet (25 mg total) by mouth at bedtime. 03/14/20 04/13/20  Marthenia Rolling, DO    Allergies    Ace inhibitors, Angiotensin receptor blockers, and Hctz [hydrochlorothiazide]  Review of Systems   Review of Systems All  systems reviewed and negative, other than as noted in HPI.  Physical Exam Updated Vital Signs BP (!) 166/87 (BP Location: Left Arm)   Pulse 90   Temp 98.1 F (36.7 C) (Oral)   Resp 17   LMP 03/11/2020 (Exact Date)   SpO2 100%   Physical Exam Vitals and nursing note reviewed.  Constitutional:      General: She is not in acute distress.    Appearance: She is well-developed.  HENT:     Head: Normocephalic and atraumatic.  Eyes:     General:        Right eye: No discharge.        Left eye: No discharge.     Conjunctiva/sclera: Conjunctivae normal.  Cardiovascular:     Rate and Rhythm: Normal rate and regular rhythm.     Heart sounds: Normal heart sounds. No murmur heard.  No  friction rub. No gallop.   Pulmonary:     Effort: Pulmonary effort is normal. No respiratory distress.     Breath sounds: Normal breath sounds.  Abdominal:     General: There is no distension.     Palpations: Abdomen is soft.     Tenderness: There is no abdominal tenderness.  Musculoskeletal:        General: No tenderness.     Cervical back: Neck supple.  Skin:    General: Skin is warm and dry.  Neurological:     Mental Status: She is alert.  Psychiatric:        Behavior: Behavior normal.        Thought Content: Thought content normal.     ED Results / Procedures / Treatments   Labs (all labs ordered are listed, but only abnormal results are displayed) Labs Reviewed  URINE CULTURE - Abnormal; Notable for the following components:      Result Value   Culture MULTIPLE SPECIES PRESENT, SUGGEST RECOLLECTION (*)    All other components within normal limits  COMPREHENSIVE METABOLIC PANEL - Abnormal; Notable for the following components:   Sodium 134 (*)    Potassium 3.3 (*)    Chloride 94 (*)    Glucose, Bld 130 (*)    Total Protein 8.8 (*)    All other components within normal limits  URINALYSIS, ROUTINE W REFLEX MICROSCOPIC - Abnormal; Notable for the following components:   APPearance HAZY (*)    Hgb urine dipstick MODERATE (*)    Ketones, ur 20 (*)    Bacteria, UA MANY (*)    All other components within normal limits  CBC  I-STAT BETA HCG BLOOD, ED ([facility], [facility], [facility] ONLY)  POC URINE PREG, ED    EKG EKG Interpretation  Date/Time:  Wednesday March 13 2020 12:59:28 EDT Ventricular Rate:  103 PR Interval:    QRS Duration: 97 QT Interval:  382 QTC Calculation: 501 R Axis:   60 Text Interpretation: Sinus tachycardia Low voltage, precordial leads Borderline T abnormalities, diffuse leads Borderline prolonged QT interval Confirmed by Raeford Razor [phone]) on 03/13/2020 5:40:25 PM   Radiology No results found.  Procedures Procedures (including critical care  time)  Medications Ordered in ED Medications  sodium chloride flush (NS) 0.9 % injection 3 mL (3 mLs Intravenous Given 03/13/20 1807)  lactated ringers bolus 1,000 mL (0 mLs Intravenous Stopped 03/13/20 1926)  ketorolac (TORADOL) 15 MG/ML injection 15 mg (15 mg Intravenous Given 03/13/20 1807)  famotidine (PEPCID) IVPB 20 mg premix (0 mg Intravenous Stopped 03/13/20 1835)  potassium chloride SA (KLOR-CON)  CR tablet 40 mEq (40 mEq Oral Given 03/13/20 1808)  magnesium oxide (MAG-OX) tablet 400 mg (400 mg Oral Given 03/13/20 1808)    ED Course  I have reviewed the triage vital signs and the nursing notes.  Pertinent labs & imaging results that were available during my care of the patient were reviewed by me and considered in my medical decision making (see chart for details).    MDM Rules/Calculators/A&P                          43yF with generalized fatigue. Possibly related to diarrheal illness. Abdominal exam benign. Afebrile. HR improved and feeling better after symptomatic tx. It has been determined that no acute conditions requiring further emergency intervention are present at this time. The patient has been advised of the diagnosis and plan. I reviewed any labs and imaging including any potential incidental findings. I have reviewed nursing notes and appropriate previous records. We have discussed signs and symptoms that warrant return to the ED and they are listed in the discharge instructions.     Final Clinical Impression(s) / ED Diagnoses Final diagnoses:  Other fatigue  Hypokalemia    Rx / DC Orders ED Discharge Orders    None       Virgel Manifold, MD 03/17/20 1646

## 2020-03-18 ENCOUNTER — Encounter (HOSPITAL_COMMUNITY): Payer: Self-pay | Admitting: Emergency Medicine

## 2020-03-18 ENCOUNTER — Other Ambulatory Visit: Payer: Self-pay

## 2020-03-18 ENCOUNTER — Emergency Department (HOSPITAL_COMMUNITY)
Admission: EM | Admit: 2020-03-18 | Discharge: 2020-03-19 | Disposition: A | Discharge status: Home / Self Care | Payer: Medicaid Other | Attending: Emergency Medicine | Admitting: Emergency Medicine

## 2020-03-18 DIAGNOSIS — R000 Tachycardia, unspecified: Secondary | ICD-10-CM | POA: Insufficient documentation

## 2020-03-18 DIAGNOSIS — Z5321 Procedure and treatment not carried out due to patient leaving prior to being seen by health care provider: Secondary | ICD-10-CM | POA: Insufficient documentation

## 2020-03-18 LAB — CBC
HCT: 37.4 % (ref 36.0–46.0)
Hemoglobin: 12.1 g/dL (ref 12.0–15.0)
MCH: 28.9 pg (ref 26.0–34.0)
MCHC: 32.4 g/dL (ref 30.0–36.0)
MCV: 89.5 fL (ref 80.0–100.0)
Platelets: 297 10*3/uL (ref 150–400)
RBC: 4.18 MIL/uL (ref 3.87–5.11)
RDW: 13.9 % (ref 11.5–15.5)
WBC: 7.9 10*3/uL (ref 4.0–10.5)
nRBC: 0 % (ref 0.0–0.2)

## 2020-03-18 LAB — BASIC METABOLIC PANEL
Anion gap: 9 (ref 5–15)
BUN: 6 mg/dL (ref 6–20)
CO2: 26 mmol/L (ref 22–32)
Calcium: 9.6 mg/dL (ref 8.9–10.3)
Chloride: 106 mmol/L (ref 98–111)
Creatinine, Ser: 0.84 mg/dL (ref 0.44–1.00)
GFR calc Af Amer: >60 mL/min (ref >60)
GFR calc non Af Amer: >60 mL/min (ref >60)
Glucose, Bld: 110 mg/dL — ABNORMAL HIGH (ref 70–99)
Potassium: 3.7 mmol/L (ref 3.5–5.1)
Sodium: 141 mmol/L (ref 135–145)

## 2020-03-18 LAB — I-STAT BETA HCG BLOOD, ED (MC, WL, AP ONLY): I-stat hCG, quantitative: <5 m[IU]/mL (ref <5)

## 2020-03-18 MED ORDER — SODIUM CHLORIDE 0.9% FLUSH: 3.0000 mL | Freq: Once | INTRAVENOUS | Status: DC

## 2020-03-18 NOTE — ED Triage Notes (Signed)
Patient arrives to ED with complaints of feeling dizzy since waking up this morning. Patient states that she's been belching, has polydipsia, and polyuria all throughout the day. Denies CP or SOB noted in triage. No neuro abnormalities.

## 2020-03-19 NOTE — ED Notes (Signed)
No answer for vitals x 3  

## 2020-03-20 ENCOUNTER — Other Ambulatory Visit: Payer: Self-pay

## 2020-03-20 ENCOUNTER — Encounter (HOSPITAL_COMMUNITY): Payer: Self-pay | Admitting: Emergency Medicine

## 2020-03-20 ENCOUNTER — Emergency Department (HOSPITAL_COMMUNITY)
Admission: EM | Admit: 2020-03-20 | Discharge: 2020-03-21 | Disposition: A | Discharge status: Home / Self Care | Payer: Medicaid Other | Attending: Emergency Medicine | Admitting: Emergency Medicine

## 2020-03-20 DIAGNOSIS — N182 Chronic kidney disease, stage 2 (mild): Secondary | ICD-10-CM | POA: Insufficient documentation

## 2020-03-20 DIAGNOSIS — R1010 Upper abdominal pain, unspecified: Secondary | ICD-10-CM | POA: Diagnosis present

## 2020-03-20 DIAGNOSIS — Z79899 Other long term (current) drug therapy: Secondary | ICD-10-CM | POA: Insufficient documentation

## 2020-03-20 DIAGNOSIS — K8020 Calculus of gallbladder without cholecystitis without obstruction: Secondary | ICD-10-CM | POA: Diagnosis not present

## 2020-03-20 DIAGNOSIS — K219 Gastro-esophageal reflux disease without esophagitis: Secondary | ICD-10-CM | POA: Insufficient documentation

## 2020-03-20 DIAGNOSIS — I129 Hypertensive chronic kidney disease with stage 1 through stage 4 chronic kidney disease, or unspecified chronic kidney disease: Secondary | ICD-10-CM | POA: Insufficient documentation

## 2020-03-20 LAB — COMPREHENSIVE METABOLIC PANEL
ALT: 15 U/L (ref 0–44)
AST: 16 U/L (ref 15–41)
Albumin: 3.9 g/dL (ref 3.5–5.0)
Alkaline Phosphatase: 50 U/L (ref 38–126)
Anion gap: 9 (ref 5–15)
BUN: 7 mg/dL (ref 6–20)
CO2: 24 mmol/L (ref 22–32)
Calcium: 9.8 mg/dL (ref 8.9–10.3)
Chloride: 104 mmol/L (ref 98–111)
Creatinine, Ser: 0.91 mg/dL (ref 0.44–1.00)
GFR calc Af Amer: >60 mL/min (ref >60)
GFR calc non Af Amer: >60 mL/min (ref >60)
Glucose, Bld: 106 mg/dL — ABNORMAL HIGH (ref 70–99)
Potassium: 3.4 mmol/L — ABNORMAL LOW (ref 3.5–5.1)
Sodium: 137 mmol/L (ref 135–145)
Total Bilirubin: 0.5 mg/dL (ref 0.3–1.2)
Total Protein: 8 g/dL (ref 6.5–8.1)

## 2020-03-20 LAB — I-STAT BETA HCG BLOOD, ED (MC, WL, AP ONLY): I-stat hCG, quantitative: <5 m[IU]/mL (ref <5)

## 2020-03-20 LAB — URINALYSIS, ROUTINE W REFLEX MICROSCOPIC
Bilirubin Urine: NEGATIVE
Glucose, UA: NEGATIVE mg/dL
Ketones, ur: 20 mg/dL — AB
Leukocytes,Ua: NEGATIVE
Nitrite: NEGATIVE
Protein, ur: NEGATIVE mg/dL
Specific Gravity, Urine: 1.015 (ref 1.005–1.030)
pH: 6 (ref 5.0–8.0)

## 2020-03-20 LAB — CBC
HCT: 36.7 % (ref 36.0–46.0)
Hemoglobin: 11.9 g/dL — ABNORMAL LOW (ref 12.0–15.0)
MCH: 29 pg (ref 26.0–34.0)
MCHC: 32.4 g/dL (ref 30.0–36.0)
MCV: 89.5 fL (ref 80.0–100.0)
Platelets: 321 10*3/uL (ref 150–400)
RBC: 4.1 MIL/uL (ref 3.87–5.11)
RDW: 13.9 % (ref 11.5–15.5)
WBC: 6.7 10*3/uL (ref 4.0–10.5)
nRBC: 0 % (ref 0.0–0.2)

## 2020-03-20 LAB — LIPASE, BLOOD: Lipase: 24 U/L (ref 11–51)

## 2020-03-20 MED ORDER — SODIUM CHLORIDE 0.9% FLUSH: 3.0000 mL | Freq: Once | INTRAVENOUS | Status: DC

## 2020-03-20 NOTE — ED Triage Notes (Signed)
Pt c/o abdominal pain, belching and diarrhea x 3 days. States she took pepcid and it relieved some of her symptoms.

## 2020-03-21 ENCOUNTER — Emergency Department (HOSPITAL_COMMUNITY): Payer: Medicaid Other

## 2020-03-21 ENCOUNTER — Ambulatory Visit: Payer: Medicaid Other | Admitting: Family Medicine

## 2020-03-21 ENCOUNTER — Emergency Department (HOSPITAL_COMMUNITY)
Admission: EM | Admit: 2020-03-21 | Discharge: 2020-03-21 | Disposition: A | Discharge status: Home / Self Care | Payer: Medicaid Other | Attending: Emergency Medicine | Admitting: Emergency Medicine

## 2020-03-21 ENCOUNTER — Encounter (HOSPITAL_COMMUNITY): Payer: Self-pay | Admitting: Emergency Medicine

## 2020-03-21 DIAGNOSIS — R079 Chest pain, unspecified: Secondary | ICD-10-CM | POA: Diagnosis not present

## 2020-03-21 DIAGNOSIS — J45909 Unspecified asthma, uncomplicated: Secondary | ICD-10-CM | POA: Insufficient documentation

## 2020-03-21 DIAGNOSIS — R142 Eructation: Secondary | ICD-10-CM | POA: Diagnosis not present

## 2020-03-21 DIAGNOSIS — K219 Gastro-esophageal reflux disease without esophagitis: Secondary | ICD-10-CM | POA: Diagnosis not present

## 2020-03-21 DIAGNOSIS — Z79899 Other long term (current) drug therapy: Secondary | ICD-10-CM | POA: Insufficient documentation

## 2020-03-21 DIAGNOSIS — K8020 Calculus of gallbladder without cholecystitis without obstruction: Secondary | ICD-10-CM | POA: Diagnosis not present

## 2020-03-21 DIAGNOSIS — N182 Chronic kidney disease, stage 2 (mild): Secondary | ICD-10-CM | POA: Diagnosis not present

## 2020-03-21 DIAGNOSIS — I129 Hypertensive chronic kidney disease with stage 1 through stage 4 chronic kidney disease, or unspecified chronic kidney disease: Secondary | ICD-10-CM | POA: Insufficient documentation

## 2020-03-21 DIAGNOSIS — R1010 Upper abdominal pain, unspecified: Secondary | ICD-10-CM | POA: Diagnosis present

## 2020-03-21 DIAGNOSIS — E860 Dehydration: Secondary | ICD-10-CM | POA: Diagnosis not present

## 2020-03-21 HISTORY — DX: Gastro-esophageal reflux disease without esophagitis: K21.9

## 2020-03-21 LAB — CBC WITH DIFFERENTIAL/PLATELET
Abs Immature Granulocytes: 0.02 10*3/uL (ref 0.00–0.07)
Basophils Absolute: 0 10*3/uL (ref 0.0–0.1)
Basophils Relative: 0 %
Eosinophils Absolute: 0 10*3/uL (ref 0.0–0.5)
Eosinophils Relative: 0 %
HCT: 40.4 % (ref 36.0–46.0)
Hemoglobin: 13 g/dL (ref 12.0–15.0)
Immature Granulocytes: 0 %
Lymphocytes Relative: 14 %
Lymphs Abs: 1.3 10*3/uL (ref 0.7–4.0)
MCH: 28.6 pg (ref 26.0–34.0)
MCHC: 32.2 g/dL (ref 30.0–36.0)
MCV: 89 fL (ref 80.0–100.0)
Monocytes Absolute: 0.4 10*3/uL (ref 0.1–1.0)
Monocytes Relative: 4 %
Neutro Abs: 7.9 10*3/uL — ABNORMAL HIGH (ref 1.7–7.7)
Neutrophils Relative %: 82 %
Platelets: 326 10*3/uL (ref 150–400)
RBC: 4.54 MIL/uL (ref 3.87–5.11)
RDW: 14 % (ref 11.5–15.5)
WBC: 9.7 10*3/uL (ref 4.0–10.5)
nRBC: 0 % (ref 0.0–0.2)

## 2020-03-21 LAB — COMPREHENSIVE METABOLIC PANEL
ALT: 18 U/L (ref 0–44)
AST: 17 U/L (ref 15–41)
Albumin: 4.5 g/dL (ref 3.5–5.0)
Alkaline Phosphatase: 54 U/L (ref 38–126)
Anion gap: 13 (ref 5–15)
BUN: 8 mg/dL (ref 6–20)
CO2: 25 mmol/L (ref 22–32)
Calcium: 9.9 mg/dL (ref 8.9–10.3)
Chloride: 101 mmol/L (ref 98–111)
Creatinine, Ser: 0.82 mg/dL (ref 0.44–1.00)
GFR calc Af Amer: >60 mL/min (ref >60)
GFR calc non Af Amer: >60 mL/min (ref >60)
Glucose, Bld: 126 mg/dL — ABNORMAL HIGH (ref 70–99)
Potassium: 3.6 mmol/L (ref 3.5–5.1)
Sodium: 139 mmol/L (ref 135–145)
Total Bilirubin: 0.9 mg/dL (ref 0.3–1.2)
Total Protein: 9.2 g/dL — ABNORMAL HIGH (ref 6.5–8.1)

## 2020-03-21 LAB — TROPONIN I (HIGH SENSITIVITY)
Troponin I (High Sensitivity): 2 ng/L (ref <18)
Troponin I (High Sensitivity): <2 ng/L (ref <18)

## 2020-03-21 LAB — D-DIMER, QUANTITATIVE: D-Dimer, Quant: <0.27 ug/mL-FEU (ref 0.00–0.50)

## 2020-03-21 MED ORDER — LORAZEPAM 2 MG/ML IJ SOLN
0.5000 mg | Freq: Once | INTRAMUSCULAR | Status: AC
  Administered 2020-03-21: 0.5 mg via INTRAVENOUS
  Filled 2020-03-21: qty 1

## 2020-03-21 MED ORDER — FAMOTIDINE IN NACL 20-0.9 MG/50ML-% IV SOLN
20.0000 mg | Freq: Once | INTRAVENOUS | Status: DC
  Filled 2020-03-21: qty 50

## 2020-03-21 MED ORDER — LIDOCAINE VISCOUS HCL 2 % MT SOLN
15.0000 mL | Freq: Once | OROMUCOSAL | Status: AC
  Administered 2020-03-21: 15 mL via ORAL
  Filled 2020-03-21: qty 15

## 2020-03-21 MED ORDER — ALUM & MAG HYDROXIDE-SIMETH 200-200-20 MG/5ML PO SUSP
30.0000 mL | Freq: Once | ORAL | Status: AC
  Administered 2020-03-21: 30 mL via ORAL
  Filled 2020-03-21: qty 30

## 2020-03-21 MED ORDER — PANTOPRAZOLE SODIUM 40 MG PO TBEC
40.0000 mg | DELAYED_RELEASE_TABLET | Freq: Every day | ORAL | 3 refills | Status: DC
Start: 2020-03-21 — End: 2020-03-22

## 2020-03-21 MED ORDER — HYDROMORPHONE HCL 1 MG/ML IJ SOLN
1.0000 mg | Freq: Once | INTRAMUSCULAR | Status: DC
  Filled 2020-03-21: qty 1

## 2020-03-21 MED ORDER — SODIUM CHLORIDE 0.9 % IV BOLUS
500.0000 mL | Freq: Once | INTRAVENOUS | Status: AC
  Administered 2020-03-21: 500 mL via INTRAVENOUS

## 2020-03-21 MED ORDER — SUCRALFATE 1 G PO TABS
1.0000 g | ORAL_TABLET | Freq: Once | ORAL | Status: AC
  Administered 2020-03-21: 1 g via ORAL
  Filled 2020-03-21: qty 1

## 2020-03-21 MED ORDER — SUCRALFATE 1 G PO TABS
1.0000 g | ORAL_TABLET | Freq: Three times a day (TID) | ORAL | 0 refills | Status: DC
Start: 2020-03-21 — End: 2020-05-01

## 2020-03-21 MED ORDER — FAMOTIDINE 20 MG PO TABS
40.0000 mg | ORAL_TABLET | Freq: Once | ORAL | Status: AC
  Administered 2020-03-21: 40 mg via ORAL
  Filled 2020-03-21: qty 2

## 2020-03-21 MED ORDER — FAMOTIDINE IN NACL 20-0.9 MG/50ML-% IV SOLN
20.0000 mg | Freq: Once | INTRAVENOUS | Status: AC
  Administered 2020-03-21: 20 mg via INTRAVENOUS
  Filled 2020-03-21: qty 50

## 2020-03-21 MED ORDER — ONDANSETRON HCL 4 MG/2ML IJ SOLN
4.0000 mg | Freq: Once | INTRAMUSCULAR | Status: DC
  Filled 2020-03-21: qty 2

## 2020-03-21 MED ORDER — SODIUM CHLORIDE 0.9 % IV BOLUS
1000.0000 mL | Freq: Once | INTRAVENOUS | Status: AC
  Administered 2020-03-21: 1000 mL via INTRAVENOUS

## 2020-03-21 NOTE — ED Triage Notes (Addendum)
Per EMS-states excessive belching, was seen at [facility] for same symptoms-given script-states symptoms has not resolved so she came back-patient complaining of CP

## 2020-03-21 NOTE — ED Provider Notes (Signed)
[facility] EMERGENCY DEPARTMENT Provider Note   CSN: [id number] Arrival date & time: 03/20/20  1628     History Chief Complaint  Patient presents with  . Abdominal Pain    Tamara Church is a 44 y.o. female.  Patient presents to the emergency department for evaluation of abdominal discomfort.  Patient reports that symptoms began 3 days ago.  She has had a burning sensation in the upper abdomen that radiates up into the chest and throat area associated with belching.  No shortness of breath.  She reports that she has a history of reflux.  She has been using Prilosec but when the symptoms began switched to [facility].  She has had only marginal relief.        Past Medical History:  Diagnosis Date  . Anemia   . Hypertension     Patient Active Problem List   Diagnosis Date Noted  . Adjustment disorder with depressed mood 03/15/2020  . Difficulty eating 03/14/2020  . Seasonal allergies 02/08/2020  . Decreased hearing 12/15/2019  . Hypokalemia 04/03/2019  . CKD (chronic kidney disease) stage 2, GFR 60-89 ml/min 03/21/2012  . ANEMIA 09/07/2008  . GERD 09/04/2008  . OBESITY, NOS 11/18/2006  . HYPERTENSION, BENIGN SYSTEMIC 11/18/2006    Past Surgical History:  Procedure Laterality Date  . NO PAST SURGERIES       OB History    Gravida  6   Para  6   Term  6   Preterm  0   AB  0   Living  6     SAB  0   TAB  0   Ectopic  0   Multiple  0   Live Births              Family History  Problem Relation Age of Onset  . Hypertension Mother     Social History   Tobacco Use  . Smoking status: Never Smoker  . Smokeless tobacco: Never Used  Substance Use Topics  . Alcohol use: No  . Drug use: No    Home Medications Prior to Admission medications   Medication Sig Start Date End Date Taking? Authorizing Provider  amLODipine (NORVASC) 10 MG tablet TAKE 1 TABLET BY MOUTH AT BEDTIME Patient taking differently: Take 10 mg by mouth  daily.  05/17/19   Lennox Solders, MD  azelastine (ASTELIN) 0.1 % nasal spray Place 2 sprays into both nostrils 2 (two) times daily. Use in each nostril as directed Patient taking differently: Place 2 sprays into both nostrils 2 (two) times daily as needed for rhinitis or allergies. Use in each nostril as directed 02/08/20   Mirian Mo, MD  famotidine (PEPCID) 20 MG tablet Take 1 tablet (20 mg total) by mouth 2 (two) times daily. 07/14/19   Lennox Solders, MD  fluticasone (FLONASE) 50 MCG/ACT nasal spray Place 1 spray into both nostrils daily. Patient not taking: Reported on 12/22/2019 11/29/19 12/30/19  Fayrene Helper, PA-C  levocetirizine (XYZAL) 5 MG tablet Take 1 tablet (5 mg total) by mouth every evening. Patient not taking: Reported on 03/13/2020 02/08/20 03/09/20  Mirian Mo, MD  levonorgestrel [facility]) 20 MCG/24HR IUD 1 each by Intrauterine route once.    [provider]  pantoprazole (PROTONIX) 40 MG tablet Take 1 tablet (40 mg total) by mouth daily. 03/21/20   Gilda Crease, MD  promethazine-dextromethorphan (PROMETHAZINE-DM) 6.25-15 MG/5ML syrup Take 5 mLs by mouth 4 (four) times daily as needed for  cough. Patient not taking: Reported on 03/13/2020 03/04/20   Eustace Moore, MD  spironolactone (ALDACTONE) 25 MG tablet Take 1 tablet (25 mg total) by mouth at bedtime. 03/14/20 04/13/20  Marthenia Rolling, DO  sucralfate (CARAFATE) 1 g tablet Take 1 tablet (1 g total) by mouth 4 (four) times daily -  with meals and at bedtime. 03/21/20   Gilda Crease, MD    Allergies    Ace inhibitors, Angiotensin receptor blockers, and Hctz [hydrochlorothiazide]  Review of Systems   Review of Systems  Gastrointestinal: Positive for abdominal pain.  All other systems reviewed and are negative.   Physical Exam Updated Vital Signs BP 128/90 (BP Location: Left Arm)   Pulse 83   Temp 99.1 F (37.3 C) (Oral)   Resp 16   LMP 03/11/2020 (Exact Date)   SpO2 100%   Physical  Exam Vitals and nursing note reviewed.  Constitutional:      General: She is not in acute distress.    Appearance: Normal appearance. She is well-developed.  HENT:     Head: Normocephalic and atraumatic.     Right Ear: Hearing normal.     Left Ear: Hearing normal.     Nose: Nose normal.  Eyes:     Conjunctiva/sclera: Conjunctivae normal.     Pupils: Pupils are equal, round, and reactive to light.  Cardiovascular:     Rate and Rhythm: Regular rhythm.     Heart sounds: S1 normal and S2 normal. No murmur heard.  No friction rub. No gallop.   Pulmonary:     Effort: Pulmonary effort is normal. No respiratory distress.     Breath sounds: Normal breath sounds.  Chest:     Chest wall: No tenderness.  Abdominal:     General: Bowel sounds are normal.     Palpations: Abdomen is soft.     Tenderness: There is abdominal tenderness in the right upper quadrant and epigastric area. There is no guarding or rebound. Negative signs include Murphy's sign and McBurney's sign.     Hernia: No hernia is present.  Musculoskeletal:        General: Normal range of motion.     Cervical back: Normal range of motion and neck supple.  Skin:    General: Skin is warm and dry.     Findings: No rash.  Neurological:     Mental Status: She is alert and oriented to person, place, and time.     GCS: GCS eye subscore is 4. GCS verbal subscore is 5. GCS motor subscore is 6.     Cranial Nerves: No cranial nerve deficit.     Sensory: No sensory deficit.     Coordination: Coordination normal.  Psychiatric:        Speech: Speech normal.        Behavior: Behavior normal.        Thought Content: Thought content normal.     ED Results / Procedures / Treatments   Labs (all labs ordered are listed, but only abnormal results are displayed) Labs Reviewed  COMPREHENSIVE METABOLIC PANEL - Abnormal; Notable for the following components:      Result Value   Potassium 3.4 (*)    Glucose, Bld 106 (*)    All other  components within normal limits  CBC - Abnormal; Notable for the following components:   Hemoglobin 11.9 (*)    All other components within normal limits  URINALYSIS, ROUTINE W REFLEX MICROSCOPIC - Abnormal; Notable for the following  components:   Hgb urine dipstick SMALL (*)    Ketones, ur 20 (*)    Bacteria, UA RARE (*)    All other components within normal limits  LIPASE, BLOOD  I-STAT BETA HCG BLOOD, ED ([facility], [facility], [facility] ONLY)    EKG None  Radiology US ABDOMEN LIMITED RUQ  Result Date: 03/21/2020 CLINICAL DATA:  Initial evaluation for acute upper abdominal pain. EXAM: ULTRASOUND ABDOMEN LIMITED RIGHT UPPER QUADRANT COMPARISON:  Prior radiograph from 12/22/2019. FINDINGS: Gallbladder: 6 mm echogenic focus within the gallbladder lumen suggestive of a small stone. Gallbladder wall measures within normal limits at 3 mm. No free pericholecystic fluid. No sonographic Murphy sign elicited on exam. Common bile duct: Diameter: 4.2 mm Liver: No focal lesion identified. Within normal limits in parenchymal echogenicity. Portal vein is patent on color Doppler imaging with normal direction of blood flow towards the liver. Other: None. IMPRESSION: 1. Mild cholelithiasis. No other sonographic features to suggest acute cholecystitis. 2. No biliary dilatation. 3. Normal sonographic evaluation of the liver. No other acute abnormality identified. Electronically Signed   By: Rise Mu M.D.   On: 03/21/2020 02:14    Procedures Procedures (including critical care time)  Medications Ordered in ED Medications  sodium chloride flush (NS) 0.9 % injection 3 mL (has no administration in time range)  HYDROmorphone (DILAUDID) injection 1 mg (has no administration in time range)  ondansetron (ZOFRAN) injection 4 mg (has no administration in time range)  famotidine (PEPCID) IVPB 20 mg premix (has no administration in time range)  alum & mag hydroxide-simeth (MAALOX/MYLANTA) 200-200-20 MG/5ML suspension 30 mL  (30 mLs Oral Given 03/21/20 0306)    And  lidocaine (XYLOCAINE) 2 % viscous mouth solution 15 mL (15 mLs Oral Given 03/21/20 0306)    ED Course  I have reviewed the triage vital signs and the nursing notes.  Pertinent labs & imaging results that were available during my care of the patient were reviewed by me and considered in my medical decision making (see chart for details).    MDM Rules/Calculators/A&P                          Patient presents with abdominal discomfort and symptoms of significant acid reflux.  She did have mild tenderness in the epigastric and right upper quadrant.  No signs of peritonitis, negative Murphy sign on exam.  Lab work is reassuring.  Ultrasound does show a small gallstone without signs of cholecystitis.  Will discharge with aggressive reflux treatment, follow-up with GI and surgery.  Final Clinical Impression(s) / ED Diagnoses Final diagnoses:  Gastroesophageal reflux disease, unspecified whether esophagitis present  Calculus of gallbladder without cholecystitis without obstruction    Rx / DC Orders ED Discharge Orders         Ordered    pantoprazole (PROTONIX) 40 MG tablet  Daily     Discontinue  Reprint     03/21/20 0407    sucralfate (CARAFATE) 1 g tablet  3 times daily with meals & bedtime     Discontinue  Reprint     03/21/20 0407           Gilda Crease, MD 03/21/20 [phone]

## 2020-03-21 NOTE — Discharge Instructions (Signed)
Please fill the prescriptions that you were given on your prior visit to help with your belching.  Please follow up with your primary care provider within 5-7 days for re-evaluation of your symptoms. If you do not have a primary care provider, information for a healthcare clinic has been provided for you to make arrangements for follow up care. Please return to the emergency department for any new or worsening symptoms.

## 2020-03-21 NOTE — ED Provider Notes (Signed)
[facility]-EMERGENCY DEPT Provider Note   CSN: [id number] Arrival date & time: 03/21/20  7829     History Chief Complaint  Patient presents with  . belching  . Chest Pain    Tamara Church is a 44 y.o. female.  HPI    Pt is a 44 y/o female with a h/o anemia, GERD, HTN, who presents to the ED today for eval of belching. States sxs have been present for the last 4 days. States she had "just a little bit" of chest pain. Describes pain as a sharp pain the pasts for seconds at a time. She has no pain currently.  Reports nausea. Denies vomiting, SOB, pleuritic pain, or abd pain.   She was seen in the ED yesterday for eval of similar sxs. Had reassuring w/u with normal labs and a RUQ [place] which showed a gallstone without changes to suggest cholecystitis. States she was given rx for protonix but did not fill it yet because she was just discharged from the hospital at [facility].   Past Medical History:  Diagnosis Date  . Anemia   . GERD (gastroesophageal reflux disease)   . Hypertension     Patient Active Problem List   Diagnosis Date Noted  . Adjustment disorder with depressed mood 03/15/2020  . Difficulty eating 03/14/2020  . Seasonal allergies 02/08/2020  . Decreased hearing 12/15/2019  . Hypokalemia 04/03/2019  . CKD (chronic kidney disease) stage 2, GFR 60-89 ml/min 03/21/2012  . ANEMIA 09/07/2008  . GERD 09/04/2008  . OBESITY, NOS 11/18/2006  . HYPERTENSION, BENIGN SYSTEMIC 11/18/2006    Past Surgical History:  Procedure Laterality Date  . NO PAST SURGERIES       OB History    Gravida  6   Para  6   Term  6   Preterm  0   AB  0   Living  6     SAB  0   TAB  0   Ectopic  0   Multiple  0   Live Births              Family History  Problem Relation Age of Onset  . Hypertension Mother     Social History   Tobacco Use  . Smoking status: Never Smoker  . Smokeless tobacco: Never Used  Substance Use Topics  . Alcohol use: No    . Drug use: No    Home Medications Prior to Admission medications   Medication Sig Start Date End Date Taking? Authorizing Provider  amLODipine (NORVASC) 10 MG tablet TAKE 1 TABLET BY MOUTH AT BEDTIME Patient taking differently: Take 10 mg by mouth daily.  05/17/19  Yes Winfrey, Harlen Labs, MD  levonorgestrel (MIRENA) 20 MCG/24HR IUD 1 each by Intrauterine route once.   Yes [provider]  pantoprazole (PROTONIX) 40 MG tablet Take 1 tablet (40 mg total) by mouth daily. 03/21/20  Yes Pollina, Canary Brim, MD  spironolactone (ALDACTONE) 25 MG tablet Take 1 tablet (25 mg total) by mouth at bedtime. 03/14/20 04/13/20 Yes Bland, Scott, DO  azelastine (ASTELIN) 0.1 % nasal spray Place 2 sprays into both nostrils 2 (two) times daily. Use in each nostril as directed Patient not taking: Reported on 03/21/2020 02/08/20   Mirian Mo, MD  famotidine (PEPCID) 20 MG tablet Take 1 tablet (20 mg total) by mouth 2 (two) times daily. Patient not taking: Reported on 03/21/2020 07/14/19   Lennox Solders, MD  fluticasone [facility]) 50 MCG/ACT nasal spray  Place 1 spray into both nostrils daily. Patient not taking: Reported on 12/22/2019 11/29/19 12/30/19  Fayrene Helperran, [place], PA-C  levocetirizine (XYZAL) 5 MG tablet Take 1 tablet (5 mg total) by mouth every evening. Patient not taking: Reported on 03/13/2020 02/08/20 03/09/20  Mirian MoFrank, Peter, MD  promethazine-dextromethorphan (PROMETHAZINE-DM) 6.25-15 MG/5ML syrup Take 5 mLs by mouth 4 (four) times daily as needed for cough. Patient not taking: Reported on 03/13/2020 03/04/20   Eustace MooreNelson, Yvonne Sue, MD  sucralfate (CARAFATE) 1 g tablet Take 1 tablet (1 g total) by mouth 4 (four) times daily -  with meals and at bedtime. 03/21/20   Gilda CreasePollina, Christopher J, MD    Allergies    Ace inhibitors, Angiotensin receptor blockers, and Hctz [hydrochlorothiazide]  Review of Systems   Review of Systems  Constitutional: Negative for fever.  HENT: Negative for ear pain and sore throat.    Eyes: Negative for visual disturbance.  Respiratory: Negative for cough and shortness of breath.   Cardiovascular: Positive for chest pain. Negative for palpitations.  Gastrointestinal: Positive for nausea. Negative for abdominal pain, constipation, diarrhea and vomiting.  Genitourinary: Negative for dysuria and hematuria.  Musculoskeletal: Negative for back pain.  Skin: Negative for rash.  Neurological: Negative for headaches.  All other systems reviewed and are negative.   Physical Exam Updated Vital Signs BP 137/86   Pulse (!) 109   Temp 98.3 F (36.8 C)   Resp 17   LMP 03/11/2020 (Exact Date)   SpO2 100%   Physical Exam Vitals and nursing note reviewed.  Constitutional:      General: She is not in acute distress.    Appearance: She is well-developed.     Comments: Belching excessively, question if this is voluntary as pt able to start/stop belching during cardiac/lung exam and during conversation  HENT:     Head: Normocephalic and atraumatic.  Eyes:     Conjunctiva/sclera: Conjunctivae normal.  Cardiovascular:     Rate and Rhythm: Normal rate and regular rhythm.     Heart sounds: Normal heart sounds. No murmur heard.   Pulmonary:     Effort: Pulmonary effort is normal. No respiratory distress.     Breath sounds: Normal breath sounds. No decreased breath sounds, wheezing, rhonchi or rales.  Abdominal:     General: Bowel sounds are normal.     Palpations: Abdomen is soft.     Tenderness: There is no abdominal tenderness. There is no guarding or rebound.  Musculoskeletal:     Cervical back: Neck supple.  Skin:    General: Skin is warm and dry.  Neurological:     Mental Status: She is alert.     ED Results / Procedures / Treatments   Labs (all labs ordered are listed, but only abnormal results are displayed) Labs Reviewed  CBC WITH DIFFERENTIAL/PLATELET - Abnormal; Notable for the following components:      Result Value   Neutro Abs 7.9 (*)    All other  components within normal limits  COMPREHENSIVE METABOLIC PANEL - Abnormal; Notable for the following components:   Glucose, Bld 126 (*)    Total Protein 9.2 (*)    All other components within normal limits  D-DIMER, QUANTITATIVE (NOT AT [facility])  TROPONIN I (HIGH SENSITIVITY)  TROPONIN I (HIGH SENSITIVITY)    EKG None  Radiology DG Chest 2 View  Result Date: 03/21/2020 CLINICAL DATA:  Chest pain. Additional provided: Chest pain with reflux. EXAM: CHEST - 2 VIEW COMPARISON:  Prior chest radiograph 12/22/2019 FINDINGS:  Heart size within normal limits. There is no appreciable airspace consolidation. No evidence of pleural effusion or pneumothorax. No acute bony abnormality identified. IMPRESSION: No evidence of active cardiopulmonary disease. Electronically Signed   By: Jackey Loge DO   On: 03/21/2020 09:21   US ABDOMEN LIMITED RUQ  Result Date: 03/21/2020 CLINICAL DATA:  Initial evaluation for acute upper abdominal pain. EXAM: ULTRASOUND ABDOMEN LIMITED RIGHT UPPER QUADRANT COMPARISON:  Prior radiograph from 12/22/2019. FINDINGS: Gallbladder: 6 mm echogenic focus within the gallbladder lumen suggestive of a small stone. Gallbladder wall measures within normal limits at 3 mm. No free pericholecystic fluid. No sonographic Murphy sign elicited on exam. Common bile duct: Diameter: 4.2 mm Liver: No focal lesion identified. Within normal limits in parenchymal echogenicity. Portal vein is patent on color Doppler imaging with normal direction of blood flow towards the liver. Other: None. IMPRESSION: 1. Mild cholelithiasis. No other sonographic features to suggest acute cholecystitis. 2. No biliary dilatation. 3. Normal sonographic evaluation of the liver. No other acute abnormality identified. Electronically Signed   By: Rise Mu M.D.   On: 03/21/2020 02:14    Procedures Procedures (including critical care time)  Medications Ordered in ED Medications  sodium chloride 0.9 % bolus 500 mL (0  mLs Intravenous Stopped 03/21/20 1113)  LORazepam (ATIVAN) injection 0.5 mg (0.5 mg Intravenous Given 03/21/20 0948)  famotidine (PEPCID) IVPB 20 mg premix (0 mg Intravenous Stopped 03/21/20 1113)  sodium chloride 0.9 % bolus 500 mL (500 mLs Intravenous New Bag/Given 03/21/20 1112)  alum & mag hydroxide-simeth (MAALOX/MYLANTA) 200-200-20 MG/5ML suspension 30 mL (30 mLs Oral Given 03/21/20 1131)  sodium chloride 0.9 % bolus 1,000 mL (1,000 mLs Intravenous New Bag/Given 03/21/20 1303)    ED Course  I have reviewed the triage vital signs and the nursing notes.  Pertinent labs & imaging results that were available during my care of the patient were reviewed by me and considered in my medical decision making (see chart for details).    MDM Rules/Calculators/A&P                          44 y/o F presenting for eval of excessive belching. Seen yesterday due to concern for acid reflux. Had reassuring w/u including neg labs and RUQ US showing gallstones but no cholecystitis. Did not fill rx upon discharge  Pt initially tachycardic though on chart review this appears chronic. VS are otherwise unremarkable. Abdomen is soft and nontender. Lungs CTAB. No murmurs.  CBC w/o leukocytosis CMP with normal kidney function and LFTs Trop neg x2 ddimer neg making PE less likely  EKG with sinus tach, LAE, low voltage in precordial leads. No acute ischemic changes.   CXR reviewed/interpreted - No evidence of active cardiopulmonary disease.  Reviewed prior labs, TSH wnl in April. - doubt thyroid contributing to her elevated HR  11:04 PM Rechecked pt. She states she is feeling somewhat improved. Her belching is improved significantly on my eval. Will continue to monitor and repeat trop.   2:09 PM pt feeling improved. Sleeping on my eval but easily arousable. HR has improved to 98 after IVF. No evidence of emergent etiology of sxs today base on w/u. Clinically she is well appearing and is no longer belching. Advised her  to fill rx that was given earlier today and return of worse.  Final Clinical Impression(s) / ED Diagnoses Final diagnoses:  Belching  Gastroesophageal reflux disease, unspecified whether esophagitis present    Rx /  DC Orders ED Discharge Orders    None       Rayne Du 03/21/20 1411    Arby Barrette, MD 03/22/20 1017

## 2020-03-22 ENCOUNTER — Other Ambulatory Visit: Payer: Self-pay

## 2020-03-22 ENCOUNTER — Encounter: Payer: Self-pay | Admitting: Family Medicine

## 2020-03-22 ENCOUNTER — Ambulatory Visit (INDEPENDENT_AMBULATORY_CARE_PROVIDER_SITE_OTHER): Payer: Medicaid Other | Admitting: Family Medicine

## 2020-03-22 VITALS — BP 125/90 | HR 94 | Ht 62.64 in | Wt 210.8 lb

## 2020-03-22 DIAGNOSIS — F4321 Adjustment disorder with depressed mood: Secondary | ICD-10-CM | POA: Diagnosis not present

## 2020-03-22 DIAGNOSIS — R002 Palpitations: Secondary | ICD-10-CM | POA: Insufficient documentation

## 2020-03-22 DIAGNOSIS — I10 Essential (primary) hypertension: Secondary | ICD-10-CM | POA: Diagnosis not present

## 2020-03-22 DIAGNOSIS — R1013 Epigastric pain: Secondary | ICD-10-CM | POA: Diagnosis not present

## 2020-03-22 DIAGNOSIS — K219 Gastro-esophageal reflux disease without esophagitis: Secondary | ICD-10-CM

## 2020-03-22 DIAGNOSIS — K8020 Calculus of gallbladder without cholecystitis without obstruction: Secondary | ICD-10-CM | POA: Diagnosis not present

## 2020-03-22 DIAGNOSIS — Z1159 Encounter for screening for other viral diseases: Secondary | ICD-10-CM | POA: Diagnosis not present

## 2020-03-22 HISTORY — DX: Palpitations: R00.2

## 2020-03-22 MED ORDER — ESOMEPRAZOLE MAGNESIUM 40 MG PO CPDR
40.0000 mg | DELAYED_RELEASE_CAPSULE | Freq: Every day | ORAL | 0 refills | Status: DC
Start: 2020-03-22 — End: 2020-04-15

## 2020-03-22 NOTE — Progress Notes (Signed)
SUBJECTIVE:   CHIEF COMPLAINT / HPI:   Tamara Church is a pleasant 44 year old woman with history significant for hypertension, adjustment disorder secondary, obesity, and acid reflux presenting with persistent belching.  The patient reports a lifelong history of well-controlled acid reflux.  The past 5 days this is been poorly controlled.  She describes a cramping and burning sensation in her epigastric area.  The symptoms radiate to her chest.  The most bothersome symptom is belching.  She has tried everything for it she reports.  She tried Pepcid as well as Prilosec.  She reports associated nausea at times intermittent constipation.  No episodes of emesis.  No melena hematochezia.  No weight loss.  She denies right upper quadrant pain.  Belching continues to per night.  She has one bowel movement per day---she has been constipated the last week as well.  She reports the symptoms are triggered by fatty foods such as pizza and [place] fries.  The symptoms are improved but not liquids.  She denies dysphagia or diet aphasia.    The patient has been evaluated in the emergency department.  Evaluation so far has included a D-dimer, troponin both negative.  Chest x-ray unremarkable.  Right upper quadrant ultrasound showed very small cholelithiasis.  Reviewed labs which are notable for elevated protein.  The patient reports she would like a vitamin for fatigue.  Patient reports since the death of her son she has felt overwhelming fatigue.  She is able to function and care for self.  She is not working currently.  She reports sleep is disrupted by abdominal discomfort when she lies flat extent.  She reports an overall low mood and sadness.  She has good family support.  She specifically denies thoughts of hurting herself. She has intermittent palpitations mostly when she is thinking about her son.  She specifically denies chest pain or dyspnea.  PERTINENT  PMH / PSH/Family/Social History :  ED visits  and medical history.  OBJECTIVE:   BP 125/90   Pulse 94   Ht 5' 2.64" (1.591 m)   Wt 210 lb 12.8 oz (95.6 kg)   LMP 03/11/2020 (Exact Date)   SpO2 100%   BMI 37.77 kg/m   HEENT: Sclera anicteric. Dentition is moderate. Appears well hydrated. Neck: Supple Cardiac: Regular rate and rhythm. Normal S1/S2. No murmurs, rubs, or gallops appreciated. Lungs: Clear bilaterally to ascultation.  Abdomen: Normoactive bowel sounds. No tenderness to deep or light palpation. No rebound or guarding.  No RUQ pain.  Skin: Warm, dry Psych: Pleasant and appropriate    ASSESSMENT/PLAN:   Adjustment disorder with depressed mood After supportive listening.  Also offered therapy resources.  Patient declines at this time.    GERD Suspect is in part related to poorly controlled acid reflux.  Encourage dietary changes as well as weight loss and increased activity.  This could also be related to gallstones and she certainly has risk factors this disease. Will refer to general surgery.  Her symptoms seem more consistent with poorly controlled reflux or functional dyspepsia.  H. pylori gastritis must be considered as well.  Given that she is maximized PPI and H2 blocker use with persistent uncontrolled symptoms will refer to gastroenterology for consideration of endoscopy and biopsy.  For elevated serum protein repeat today suspect from dehydration. Stopped Pronotix (caused palpitations per patient), will try Nexium.   Palpitations Most likely related to adjustment disorder and possibly related to reflux.  Troponins negative.  EKG reviewed chest x-ray is  reviewed.  Will recheck TSH.  Also considered anemia recent CBC was within normal notes.   At follow up: Assess sleep, consider repeat CBC   Terisa Starr, MD  Family Medicine Teaching Service  [facility]

## 2020-03-22 NOTE — Addendum Note (Signed)
Addended by: Manson Passey, Marsheila Alejo on: 03/22/2020 03:17 PM   Modules accepted: Orders

## 2020-03-22 NOTE — Assessment & Plan Note (Addendum)
After supportive listening.  Also offered therapy resources.  Patient declines at this time.

## 2020-03-22 NOTE — Assessment & Plan Note (Signed)
Most likely related to adjustment disorder and possibly related to reflux.  Troponins negative.  EKG reviewed chest x-ray is reviewed.  Will recheck TSH.  Also considered anemia recent CBC was within normal notes.

## 2020-03-22 NOTE — Assessment & Plan Note (Addendum)
Suspect is in part related to poorly controlled acid reflux.  Encourage dietary changes as well as weight loss and increased activity.  This could also be related to gallstones and she certainly has risk factors this disease--- will refer to general surgery.  Her symptoms seem more consistent with poorly controlled reflux or functional dyspepsia.  H. pylori gastritis must be considered as well.  Given that she is maximized PPI and H2 blocker use with persistent uncontrolled symptoms will refer to gastroenterology for consideration of endoscopy and biopsy.  For elevated serum protein today suspect from dehydration. Stopped Pronotix (caused palpitations per patient), will try Nexium.

## 2020-03-22 NOTE — Patient Instructions (Addendum)
It was wonderful to see you today.  Please bring ALL of your medications with you to every visit.   Today we talked about:  - Your belching--- I think this in part related to reflux and stress   STOP protonix START Nexium 40 mg  You can take as needed Pepcid  You can take Gaviscon--- you can try this after meals  Try bland foods, that are NOT fatty  I will call you with blood work results  You should be called by Gastroenterology and General Surgery    Thank you for choosing [facility].   Please call [phone] with any questions about today's appointment.  Please be sure to schedule follow up at the front  desk before you leave today.   Terisa Starr, MD  Family Medicine

## 2020-03-23 LAB — BASIC METABOLIC PANEL
BUN/Creatinine Ratio: 10 (ref 9–23)
BUN: 9 mg/dL (ref 6–24)
CO2: 21 mmol/L (ref 20–29)
Calcium: 9.8 mg/dL (ref 8.7–10.2)
Chloride: 100 mmol/L (ref 96–106)
Creatinine, Ser: 0.88 mg/dL (ref 0.57–1.00)
GFR calc Af Amer: 94 mL/min/{1.73_m2} (ref >59)
GFR calc non Af Amer: 81 mL/min/{1.73_m2} (ref >59)
Glucose: 83 mg/dL (ref 65–99)
Potassium: 3.9 mmol/L (ref 3.5–5.2)
Sodium: 138 mmol/L (ref 134–144)

## 2020-03-23 LAB — HEPATIC FUNCTION PANEL
ALT: 11 IU/L (ref 0–32)
AST: 14 IU/L (ref 0–40)
Albumin: 4.6 g/dL (ref 3.8–4.8)
Alkaline Phosphatase: 57 IU/L (ref 48–121)
Bilirubin Total: 0.4 mg/dL (ref 0.0–1.2)
Bilirubin, Direct: 0.14 mg/dL (ref 0.00–0.40)
Total Protein: 8.2 g/dL (ref 6.0–8.5)

## 2020-03-23 LAB — TSH: TSH: 5.22 u[IU]/mL — ABNORMAL HIGH (ref 0.450–4.500)

## 2020-03-23 LAB — HCV AB W REFLEX TO QUANT PCR: HCV Ab: <0.1 s/co ratio (ref 0.0–0.9)

## 2020-03-23 LAB — HCV INTERPRETATION

## 2020-03-25 ENCOUNTER — Telehealth: Payer: Self-pay | Admitting: Family Medicine

## 2020-03-25 NOTE — Telephone Encounter (Signed)
Called patient with results---labs unremarkable for cause. TSH slightly elevated, repeat 4-6 weeks. Discussed with patient and placed in problem list. Suspect due to acute illness and N/V.   Terisa Starr, MD  Family Medicine Teaching Service

## 2020-03-26 ENCOUNTER — Ambulatory Visit: Payer: Medicaid Other | Admitting: Family Medicine

## 2020-03-28 ENCOUNTER — Ambulatory Visit: Payer: Medicaid Other | Admitting: Family Medicine

## 2020-03-28 ENCOUNTER — Encounter: Payer: Self-pay | Admitting: Nurse Practitioner

## 2020-04-05 ENCOUNTER — Emergency Department (HOSPITAL_COMMUNITY)
Admission: EM | Admit: 2020-04-05 | Discharge: 2020-04-05 | Disposition: A | Discharge status: Home / Self Care | Payer: Medicaid Other | Attending: Emergency Medicine | Admitting: Emergency Medicine

## 2020-04-05 ENCOUNTER — Emergency Department (HOSPITAL_COMMUNITY): Payer: Medicaid Other

## 2020-04-05 ENCOUNTER — Encounter (HOSPITAL_COMMUNITY): Payer: Self-pay

## 2020-04-05 ENCOUNTER — Other Ambulatory Visit: Payer: Self-pay

## 2020-04-05 DIAGNOSIS — R079 Chest pain, unspecified: Secondary | ICD-10-CM | POA: Diagnosis not present

## 2020-04-05 DIAGNOSIS — Z79899 Other long term (current) drug therapy: Secondary | ICD-10-CM | POA: Insufficient documentation

## 2020-04-05 DIAGNOSIS — K219 Gastro-esophageal reflux disease without esophagitis: Secondary | ICD-10-CM | POA: Insufficient documentation

## 2020-04-05 DIAGNOSIS — N182 Chronic kidney disease, stage 2 (mild): Secondary | ICD-10-CM | POA: Insufficient documentation

## 2020-04-05 DIAGNOSIS — R1013 Epigastric pain: Secondary | ICD-10-CM | POA: Diagnosis present

## 2020-04-05 DIAGNOSIS — I129 Hypertensive chronic kidney disease with stage 1 through stage 4 chronic kidney disease, or unspecified chronic kidney disease: Secondary | ICD-10-CM | POA: Insufficient documentation

## 2020-04-05 DIAGNOSIS — R000 Tachycardia, unspecified: Secondary | ICD-10-CM | POA: Diagnosis not present

## 2020-04-05 LAB — BASIC METABOLIC PANEL
Anion gap: 12 (ref 5–15)
BUN: 6 mg/dL (ref 6–20)
CO2: 25 mmol/L (ref 22–32)
Calcium: 10 mg/dL (ref 8.9–10.3)
Chloride: 102 mmol/L (ref 98–111)
Creatinine, Ser: 0.77 mg/dL (ref 0.44–1.00)
GFR calc Af Amer: >60 mL/min (ref >60)
GFR calc non Af Amer: >60 mL/min (ref >60)
Glucose, Bld: 111 mg/dL — ABNORMAL HIGH (ref 70–99)
Potassium: 3.5 mmol/L (ref 3.5–5.1)
Sodium: 139 mmol/L (ref 135–145)

## 2020-04-05 LAB — CBC
HCT: 39 % (ref 36.0–46.0)
Hemoglobin: 12.7 g/dL (ref 12.0–15.0)
MCH: 29.3 pg (ref 26.0–34.0)
MCHC: 32.6 g/dL (ref 30.0–36.0)
MCV: 89.9 fL (ref 80.0–100.0)
Platelets: 241 10*3/uL (ref 150–400)
RBC: 4.34 MIL/uL (ref 3.87–5.11)
RDW: 13.9 % (ref 11.5–15.5)
WBC: 5.8 10*3/uL (ref 4.0–10.5)
nRBC: 0 % (ref 0.0–0.2)

## 2020-04-05 LAB — I-STAT BETA HCG BLOOD, ED (NOT ORDERABLE): I-stat hCG, quantitative: <5 m[IU]/mL (ref <5)

## 2020-04-05 LAB — TROPONIN I (HIGH SENSITIVITY): Troponin I (High Sensitivity): 3 ng/L (ref <18)

## 2020-04-05 MED ORDER — ONDANSETRON 4 MG PO TBDP
4.0000 mg | ORAL_TABLET | Freq: Once | ORAL | Status: AC
  Administered 2020-04-05: 4 mg via ORAL
  Filled 2020-04-05: qty 1

## 2020-04-05 MED ORDER — SODIUM CHLORIDE 0.9% FLUSH: 3.0000 mL | Freq: Once | INTRAVENOUS | Status: DC

## 2020-04-05 MED ORDER — LIDOCAINE VISCOUS HCL 2 % MT SOLN
15.0000 mL | Freq: Once | OROMUCOSAL | Status: AC
  Administered 2020-04-05: 15 mL via OROMUCOSAL
  Filled 2020-04-05: qty 15

## 2020-04-05 MED ORDER — ALUM & MAG HYDROXIDE-SIMETH 200-200-20 MG/5ML PO SUSP
15.0000 mL | Freq: Once | ORAL | Status: AC
  Administered 2020-04-05: 15 mL via ORAL
  Filled 2020-04-05: qty 30

## 2020-04-05 NOTE — Discharge Instructions (Signed)
Stay hydrated with water or half Gatorade half water or low calorie Gatorade.

## 2020-04-05 NOTE — ED Provider Notes (Signed)
[facility]-EMERGENCY DEPT Provider Note   CSN: [id number] Arrival date & time: 04/05/20  1018     History Chief Complaint  Patient presents with  . Chest Pain    Tamara Church is a 44 y.o. female.   Abdominal Pain Pain location:  Epigastric Pain quality: aching   Pain radiates to:  Does not radiate Pain severity:  Mild Onset quality:  Gradual Timing:  Constant Progression:  Unchanged Chronicity:  Recurrent Context comment:  Hx of recurrent GERD flare Relieved by:  Nothing Worsened by:  Nothing Associated symptoms: anorexia, constipation and nausea   Associated symptoms: no chest pain, no chills, no cough, no diarrhea, no dysuria, no fever, no shortness of breath and no vomiting        Past Medical History:  Diagnosis Date  . Anemia   . GERD (gastroesophageal reflux disease)   . Hypertension     Patient Active Problem List   Diagnosis Date Noted  . Palpitations 03/22/2020  . Adjustment disorder with depressed mood 03/15/2020  . Seasonal allergies 02/08/2020  . Decreased hearing 12/15/2019  . CKD (chronic kidney disease) stage 2, GFR 60-89 ml/min 03/21/2012  . ANEMIA 09/07/2008  . GERD 09/04/2008  . OBESITY, NOS 11/18/2006  . HYPERTENSION, BENIGN SYSTEMIC 11/18/2006    Past Surgical History:  Procedure Laterality Date  . NO PAST SURGERIES       OB History    Gravida  6   Para  6   Term  6   Preterm  0   AB  0   Living  6     SAB  0   TAB  0   Ectopic  0   Multiple  0   Live Births              Family History  Problem Relation Age of Onset  . Hypertension Mother     Social History   Tobacco Use  . Smoking status: Never Smoker  . Smokeless tobacco: Never Used  Vaping Use  . Vaping Use: Never used  Substance Use Topics  . Alcohol use: No  . Drug use: No    Home Medications Prior to Admission medications   Medication Sig Start Date End Date Taking? Authorizing Provider  amLODipine (NORVASC)  10 MG tablet TAKE 1 TABLET BY MOUTH AT BEDTIME Patient taking differently: Take 10 mg by mouth daily.  05/17/19  Yes Winfrey, Harlen Labs, MD  esomeprazole (NEXIUM) 40 MG capsule Take 1 capsule (40 mg total) by mouth daily at 12 noon. 03/22/20  Yes Westley Chandler, MD  famotidine (PEPCID) 20 MG tablet Take 1 tablet (20 mg total) by mouth 2 (two) times daily. 07/14/19  Yes Winfrey, Harlen Labs, MD  levonorgestrel (MIRENA) 20 MCG/24HR IUD 1 each by Intrauterine route once.   Yes [provider]  spironolactone (ALDACTONE) 25 MG tablet Take 1 tablet (25 mg total) by mouth at bedtime. 03/14/20 04/13/20 Yes Bland, Scott, DO  azelastine (ASTELIN) 0.1 % nasal spray Place 2 sprays into both nostrils 2 (two) times daily. Use in each nostril as directed Patient not taking: Reported on 03/21/2020 02/08/20   Mirian Mo, MD  fluticasone [facility]) 50 MCG/ACT nasal spray Place 1 spray into both nostrils daily. Patient not taking: Reported on 12/22/2019 11/29/19 12/30/19  Fayrene Helper, PA-C  levocetirizine (XYZAL) 5 MG tablet Take 1 tablet (5 mg total) by mouth every evening. Patient not taking: Reported on 03/13/2020 02/08/20 03/09/20  Mirian Mo, MD  promethazine-dextromethorphan (PROMETHAZINE-DM) 6.25-15 MG/5ML syrup Take 5 mLs by mouth 4 (four) times daily as needed for cough. Patient not taking: Reported on 03/13/2020 03/04/20   Eustace Moore, MD  sucralfate (CARAFATE) 1 g tablet Take 1 tablet (1 g total) by mouth 4 (four) times daily -  with meals and at bedtime. Patient not taking: Reported on 03/22/2020 03/21/20   Gilda Crease, MD    Allergies    Ace inhibitors, Angiotensin receptor blockers, and Hctz [hydrochlorothiazide]  Review of Systems   Review of Systems  Constitutional: Negative for chills and fever.  HENT: Negative for congestion and rhinorrhea.   Respiratory: Negative for cough and shortness of breath.   Cardiovascular: Negative for chest pain and palpitations.  Gastrointestinal:  Positive for abdominal pain, anorexia, constipation and nausea. Negative for diarrhea and vomiting.  Genitourinary: Negative for difficulty urinating and dysuria.  Musculoskeletal: Negative for arthralgias and back pain.  Skin: Negative for rash and wound.  Neurological: Negative for light-headedness and headaches.    Physical Exam Updated Vital Signs BP (!) 140/91   Pulse 86   Temp 98.9 F (37.2 C) (Oral)   Resp 14   Ht 5' 2.5" (1.588 m)   Wt 95.6 kg   LMP 03/11/2020 (Exact Date)   SpO2 97%   BMI 37.94 kg/m   Physical Exam Vitals and nursing note reviewed. Exam conducted with a chaperone present.  Constitutional:      General: She is not in acute distress.    Appearance: Normal appearance.  HENT:     Head: Normocephalic and atraumatic.     Nose: No rhinorrhea.  Eyes:     General:        Right eye: No discharge.        Left eye: No discharge.     Conjunctiva/sclera: Conjunctivae normal.  Cardiovascular:     Rate and Rhythm: Normal rate. Rhythm irregular.  Pulmonary:     Effort: Pulmonary effort is normal. No respiratory distress.     Breath sounds: No stridor.  Abdominal:     General: Abdomen is flat. There is no distension.     Palpations: Abdomen is soft.  Musculoskeletal:        General: No tenderness or signs of injury.  Skin:    General: Skin is warm and dry.  Neurological:     General: No focal deficit present.     Mental Status: She is alert. Mental status is at baseline.     Motor: No weakness.  Psychiatric:        Mood and Affect: Mood normal.        Behavior: Behavior normal.     ED Results / Procedures / Treatments   Labs (all labs ordered are listed, but only abnormal results are displayed) Labs Reviewed  BASIC METABOLIC PANEL - Abnormal; Notable for the following components:      Result Value   Glucose, Bld 111 (*)    All other components within normal limits  CBC  I-STAT BETA HCG BLOOD, ED ([facility], [facility], [facility] ONLY)  I-STAT BETA HCG BLOOD, ED  (NOT ORDERABLE)  TROPONIN I (HIGH SENSITIVITY)    EKG EKG Interpretation  Date/Time:  Friday April 05 2020 10:27:45 EDT Ventricular Rate:  110 PR Interval:    QRS Duration: 93 QT Interval:  335 QTC Calculation: 454 R Axis:   18 Text Interpretation: Sinus tachycardia Inferior infarct, age indeterminate 89 Lead; Mason-Likar Confirmed by Cherlynn Perches ([id number]) on 04/05/2020 6:49:49 PM  Radiology DG Chest 2 View  Result Date: 04/05/2020 CLINICAL DATA:  Chest pain EXAM: CHEST - 2 VIEW COMPARISON:  03/21/2020 FINDINGS: The heart size and mediastinal contours are within normal limits. Both lungs are clear. The visualized skeletal structures are unremarkable. IMPRESSION: No active cardiopulmonary disease. Electronically Signed   By: Signa Kell M.D.   On: 04/05/2020 11:26    Procedures Procedures (including critical care time)  Medications Ordered in ED Medications  alum & mag hydroxide-simeth (MAALOX/MYLANTA) 200-200-20 MG/5ML suspension 15 mL (15 mLs Oral Given 04/05/20 1850)  lidocaine (XYLOCAINE) 2 % viscous mouth solution 15 mL (15 mLs Mouth/Throat Given 04/05/20 1850)  ondansetron (ZOFRAN-ODT) disintegrating tablet 4 mg (4 mg Oral Given 04/05/20 1850)    ED Course  I have reviewed the triage vital signs and the nursing notes.  Pertinent labs & imaging results that were available during my care of the patient were reviewed by me and considered in my medical decision making (see chart for details).    MDM Rules/Calculators/A&P                          Acute flare and recurrence of gastroesophageal reflux, she had a [employer] chicken pot pie yesterday and thinks that is what caused the flare.  She says is hurting worse than normal and has tried her home medications.  She is here for symptom control.  Laboratory evaluation was done in triage to include a negative troponin, CBC CMP fairly unremarkable for any acute pathology.  I do not believe this is PE, based on PE rule out criteria her  only risk factor is mild tachycardia however she had D-dimer for the same presentation in the past I do not feel that we need to repeat it here today.  She remains comfortable her abdomen is soft without signs of peritonitis, she will get symptom control.  His symptom control is obtained we will attempt to gain IV access and try further symptom control.  Patient has near resolution of her symptoms, gastroesophageal reflux seems most likely based on this origin relative to her dietary changes.  She has been diagnosed with this in the past and this feels like similar work-up.  I reviewed her EKG shows sinus tachycardia without acute ischemic change interval abnormality arrhythmia, having reviewed the labs including negative troponin and other laboratory evaluation and having achieved symptom control I feel that she is safe for discharge home with outpatient management.  She agrees with this plan.  Proton pump inhibitors prescribed she is recommended outpatient follow-up.  Final Clinical Impression(s) / ED Diagnoses Final diagnoses:  Gastroesophageal reflux disease, unspecified whether esophagitis present    Rx / DC Orders ED Discharge Orders    None       Sabino Donovan, MD 04/06/20 [phone]

## 2020-04-05 NOTE — ED Triage Notes (Signed)
Patient states a history of GERD, but states the pain was worse to the left chest than usual. Patient states the pain was worse since 0500 today.

## 2020-04-08 ENCOUNTER — Telehealth: Payer: Self-pay | Admitting: *Deleted

## 2020-04-08 NOTE — Telephone Encounter (Signed)
Attempted to contact pt to complete transition of care assessment; message states voicemail has not been set up yet.  Burnard Bunting, RN, BSN, CCRN Patient Engagement Center [phone]

## 2020-04-12 ENCOUNTER — Telehealth: Payer: Self-pay

## 2020-04-12 NOTE — Telephone Encounter (Signed)
Patient calls nurse line regarding medication side effects. Patient states that she was started on spironolactone on 6/24 and has been having adverse reactions for the last two weeks. Of note, patient has been seen in the ED x2 for symptoms. Patient reports dry mouth, SHOB on exertion, dehydration, worsening of GERD symptoms, diarrhea and headache.   Attempted to schedule patient appointment today, however no availability.   Strict ED precautions given in the meantime.   Will route to PCP and preceptor for further clarification for patient's medication management.   Veronda Prude, RN

## 2020-04-12 NOTE — Telephone Encounter (Signed)
Spoke with Dr. Pollie Meyer regarding patient. Agreed that patient should discontinue spironolactone for now. Moved patient's appointment to Monday at 1125. Strict ED precautions given.  Veronda Prude, RN

## 2020-04-15 ENCOUNTER — Other Ambulatory Visit: Payer: Self-pay

## 2020-04-15 ENCOUNTER — Ambulatory Visit (INDEPENDENT_AMBULATORY_CARE_PROVIDER_SITE_OTHER): Payer: Medicaid Other | Admitting: Family Medicine

## 2020-04-15 DIAGNOSIS — K219 Gastro-esophageal reflux disease without esophagitis: Secondary | ICD-10-CM | POA: Diagnosis not present

## 2020-04-15 DIAGNOSIS — I10 Essential (primary) hypertension: Secondary | ICD-10-CM | POA: Diagnosis not present

## 2020-04-15 MED ORDER — ESOMEPRAZOLE MAGNESIUM 40 MG PO CPDR: 40.0000 mg | DELAYED_RELEASE_CAPSULE | Freq: Every day | ORAL | 0 refills | Status: DC

## 2020-04-15 MED ORDER — ADULT BLOOD PRESSURE CUFF LG KIT: 1.0000 | PACK | 0 refills | Status: DC | PRN

## 2020-04-15 NOTE — Progress Notes (Signed)
    SUBJECTIVE:   CHIEF COMPLAINT / HPI:   Hypertension Patient presents today to discuss blood pressure medications.  Patient recently started on spironolactone, however had side effects from this and so discontinued it.  Patient has had failures per chart review for ACE/ARB as well as hydrochlorothiazide.  Current blood pressure medication include amlodipine 10. Last took spironolactone on Thursday last week.  GERD Patient recently started on Nexium with plan to follow-up with GI.  States the Nexium has been helping but she is about to run out of this and does not have her appointment with GI for 2 more weeks.  PERTINENT  PMH / PSH: History of hypertension with failure of ACE/ARB as well as hydrochlorothiazide.  OBJECTIVE:   BP 114/74   Pulse 91   Ht 5\' 2"  (1.575 m)   Wt 198 lb 6 oz (90 kg)   LMP 04/08/2020   SpO2 97%   BMI 36.28 kg/m    General: NAD, pleasant, able to participate in exam Cardiac: RRR, no murmurs. Respiratory: CTAB, normal effort Skin: warm and dry, no rashes noted  ASSESSMENT/PLAN:   GERD Assessment: GERD which is becoming better controlled using famotidine and Nexium.  Patient was supposed to take her Nexium until getting in with GI but does not have an appointment for GI for 2 more weeks and is about to run out of her Nexium. Plan: -Discussed risk and benefits of high-dose PPIs and after discussion with patient will provide refill for 1 month supply of this to last her until she gets evaluated by GI -Patient plans to follow-up with GI as scheduled  HYPERTENSION, BENIGN SYSTEMIC Assessment: Patient with well-controlled blood pressure today of 114/74 who was previously on spironolactone but stopped taking this last Thursday due to adverse effects.  Patient does not have a way to check her blood pressures at home and does not sure how her blood pressures range when not in the office. Plan: -We will discontinue spironolactone at this time, have patient  continue on amlodipine 10 mg/day. -We will provide patient with a written prescription for an adult blood pressure cuff. -We will have patient check blood pressures 2-3 times per day and follow-up in 1 week with these numbers recorded for further advisement. -As patient has had multiple blood pressure medications which she has had adverse effects to can consider restarting hydrochlorothiazide which was tolerated well for many months before discontinuing it for hypokalemia.  Would recommend if needs additional blood pressure management consider restarting hydrochlorothiazide with close lab follow-up to monitor electrolytes and consideration of starting potassium replacement.     Friday, DO [facility]

## 2020-04-15 NOTE — Assessment & Plan Note (Signed)
Assessment: GERD which is becoming better controlled using famotidine and Nexium.  Patient was supposed to take her Nexium until getting in with GI but does not have an appointment for GI for 2 more weeks and is about to run out of her Nexium. Plan: -Discussed risk and benefits of high-dose PPIs and after discussion with patient will provide refill for 1 month supply of this to last her until she gets evaluated by GI -Patient plans to follow-up with GI as scheduled

## 2020-04-15 NOTE — Assessment & Plan Note (Addendum)
Assessment: Patient with well-controlled blood pressure today of 114/74 who was previously on spironolactone but stopped taking this last Thursday due to adverse effects.  Patient does not have a way to check her blood pressures at home and does not sure how her blood pressures range when not in the office. Plan: -We will discontinue spironolactone at this time, have patient continue on amlodipine 10 mg/day. -We will provide patient with a written prescription for an adult blood pressure cuff. -We will have patient check blood pressures 2-3 times per day and follow-up in 1 week with these numbers recorded for further advisement. -As patient has had multiple blood pressure medications which she has had adverse effects to can consider restarting hydrochlorothiazide which was tolerated well for many months before discontinuing it for hypokalemia.  Would recommend if needs additional blood pressure management consider restarting hydrochlorothiazide with close lab follow-up to monitor electrolytes and consideration of starting potassium replacement.  -Can also consider referral to Dr. Raymondo Band for ambulatory blood pressure monitoring to determine if patient is having elevated blood pressures along with low blood pressures when monitoring at home.

## 2020-04-15 NOTE — Patient Instructions (Addendum)
It was great to see you!  Our plans for today:  - Continue your amlodipine, stop your spironolactone. For right now I would like for you to get a blood pressure cuff and check your pressures 2-3x per day for one week. Please write these pressures down and make a follow up appointment for about 1-2 weeks to go over these numbers and decide if we need more blood pressure medicines. -I am providing you a refill of your Nexium to last you until you get in with GI.  Take care and seek immediate care sooner if you develop any concerns.   Dr. Daymon [facility]en [facility]

## 2020-04-16 ENCOUNTER — Encounter: Payer: Self-pay | Admitting: *Deleted

## 2020-04-17 ENCOUNTER — Ambulatory Visit: Payer: Medicaid Other

## 2020-04-17 ENCOUNTER — Ambulatory Visit: Payer: Medicaid Other | Admitting: Family Medicine

## 2020-04-17 ENCOUNTER — Other Ambulatory Visit: Payer: Self-pay

## 2020-04-17 ENCOUNTER — Telehealth: Payer: Self-pay | Admitting: *Deleted

## 2020-04-17 NOTE — Chronic Care Management (AMB) (Signed)
  Care Management   Note  04/17/2020 Name: Tamara Church MRN: [id number] DOB: Dec 03, 1975  Tamara Church is a 44 y.o. year old female who is a primary care patient of Fayette Pho, MD. I reached out to Tamara Church by phone today in response to a referral sent by Ms. Marleta L Sickles's health plan.    Ms. Tyner was given information about care management services today including:  1. Care management services include personalized support from designated clinical staff supervised by her physician, including individualized plan of care and coordination with other care providers 2. 24/7 contact phone numbers for assistance for urgent and routine care needs. 3. The patient may stop care management services at any time by phone call to the office staff.  Patient agreed to services and verbal consent obtained.   Follow up plan: Telephone appointment with care management team member scheduled for:04/17/2020  [facility] Guide, Embedded Care Coordination [facility]  [place], [place] [address] Direct Dial: [phone] [email or web address] Website: [email or web address]

## 2020-04-18 ENCOUNTER — Encounter: Payer: Medicaid Other | Attending: Family Medicine | Admitting: Dietician

## 2020-04-18 NOTE — Patient Instructions (Signed)
Visit Information  Goals Addressed              This Visit's Progress   .  They changed my medications and it triggered a change in my blood pressure (pt-stated)        CARE PLAN ENTRY (see longitudinal plan of care for additional care plan information)  Current Barriers:  Marland Kitchen Knowledge Deficits related to basic understanding of hypertension pathophysiology and self care management  Nurse Case Manager Clinical Goal(s):  Marland Kitchen Over the next 30 days, patient will verbalize understanding of plan for hypertension management . Over the next 90 days, patient will not experience hospital admission.    Interventions:  . Evaluation of current treatment plan related to hypertension self management and patient's adherence to plan as established by provider. . Will send education to patient re: stroke prevention, s/s of heart attack and stroke, DASH diet, complications of uncontrolled blood pressure, calendar . Reviewed medications with patient and discussed importance of compliance . Discussed plans with patient for ongoing care management follow up and provided patient with direct contact information for care management team . Advised patient, providing education and rationale, to monitor blood pressure daily and record, calling PCP for findings outside established parameters.   Patient Self Care Activities:  . Self administers medications as prescribed . Attends all scheduled provider appointments . Calls provider office for new concerns, questions, or BP outside discussed parameters  Initial goal documentation         Ms. Anselmo was given information about Care Management services today including:  1. Care Management services include personalized support from designated clinical staff supervised by her physician, including individualized plan of care and coordination with other care providers 2. 24/7 contact phone numbers for assistance for urgent and routine care needs. 3. The patient  may stop CCM services at any time (effective at the end of the month) by phone call to the office staff.  Patient agreed to services and verbal consent obtained.   The patient verbalized understanding of instructions provided today and declined a print copy of patient instruction materials.   The care management team will reach out to the patient again over the next 14 days.  The patient has been provided with contact information for the care management team and has been advised to call with any health related questions or concerns.   [name] RN, BSN, [facility] Care Management Coordinator [facility] Phone: [phone] Fax: [phone]

## 2020-04-18 NOTE — Chronic Care Management (AMB) (Signed)
Care Management   Initial Visit Note  04/18/2020 Name: DANNELLE RHYMES MRN: [id number] DOB: [birth year]   Assessment: Tamara Church is a 44 y.o. year old female who sees Ezequiel Essex, MD for primary care. The care management team was consulted for assistance with care management and care coordination needs related to Disease Management Educational Needs for HTN/ CKD stage 2.   Review of patient status, including review of consultants reports, relevant laboratory and other test results, and collaboration with appropriate care team members and the patient's provider was performed as part of comprehensive patient evaluation and provision of care management services.    SDOH (Social Determinants of Health) assessments performed: No See Care Plan activities for detailed interventions related to [facility])     Outpatient Encounter Medications as of 04/17/2020  Medication Sig Note  . amLODipine (NORVASC) 10 MG tablet TAKE 1 TABLET BY MOUTH AT BEDTIME (Patient taking differently: Take 10 mg by mouth daily. )   . azelastine (ASTELIN) 0.1 % nasal spray Place 2 sprays into both nostrils 2 (two) times daily. Use in each nostril as directed   . Blood Pressure Monitoring (ADULT BLOOD PRESSURE CUFF LG) KIT 1 each by Does not apply route as needed (BP measurement).   Marland Kitchen esomeprazole (NEXIUM) 40 MG capsule Take 1 capsule (40 mg total) by mouth daily at 12 noon.   . famotidine (PEPCID) 20 MG tablet Take 1 tablet (20 mg total) by mouth 2 (two) times daily.   . promethazine-dextromethorphan (PROMETHAZINE-DM) 6.25-15 MG/5ML syrup Take 5 mLs by mouth 4 (four) times daily as needed for cough.   . sucralfate (CARAFATE) 1 g tablet Take 1 tablet (1 g total) by mouth 4 (four) times daily -  with meals and at bedtime. 03/21/2020: Has not started yet   . fluticasone (FLONASE) 50 MCG/ACT nasal spray Place 1 spray into both nostrils daily. (Patient not taking: Reported on 12/22/2019)   . levocetirizine (XYZAL) 5 MG tablet Take  1 tablet (5 mg total) by mouth every evening. (Patient not taking: Reported on 03/13/2020)   . levonorgestrel (MIRENA) 20 MCG/24HR IUD 1 each by Intrauterine route once.    No facility-administered encounter medications on file as of 04/17/2020.    Goals Addressed              This Visit's Progress   .  They changed my medications and it triggered a change in my blood pressure (pt-stated)        CARE PLAN ENTRY (see longitudinal plan of care for additional care plan information)  Current Barriers:  Marland Kitchen Knowledge Deficits related to basic understanding of hypertension pathophysiology and self care management  Nurse Case Manager Clinical Goal(s):  Marland Kitchen Over the next 30 days, patient will verbalize understanding of plan for hypertension management . Over the next 90 days, patient will not experience hospital admission.    Interventions:  . Evaluation of current treatment plan related to hypertension self management and patient's adherence to plan as established by provider. . Will send education to patient re: stroke prevention, s/s of heart attack and stroke, DASH diet, complications of uncontrolled blood pressure, calendar . Reviewed medications with patient and discussed importance of compliance . Discussed plans with patient for ongoing care management follow up and provided patient with direct contact information for care management team . Advised patient, providing education and rationale, to monitor blood pressure daily and record, calling PCP for findings outside established parameters.   Patient Self Care Activities:  . Self  administers medications as prescribed . Attends all scheduled provider appointments . Calls provider office for new concerns, questions, or BP outside discussed parameters  Initial goal documentation          Follow up plan:  The care management team will reach out to the patient again over the next 14 days.  The patient has been provided with contact  information for the care management team and has been advised to call with any health related questions or concerns.   Ms. Girdler was given information about Care Management services today including:  1. Care Management services include personalized support from designated clinical staff supervised by a physician, including individualized plan of care and coordination with other care providers 2. 24/7 contact phone numbers for assistance for urgent and routine care needs. 3. The patient may stop Care Management services at any time (effective at the end of the month) by phone call to the office staff.  Patient agreed to services and verbal consent obtained.  Lazaro Arms RN, BSN, [facility] Care Management Coordinator [place] Phone: [phone] Fax: [phone]  .

## 2020-04-20 ENCOUNTER — Emergency Department (HOSPITAL_COMMUNITY)
Admission: EM | Admit: 2020-04-20 | Discharge: 2020-04-20 | Disposition: A | Discharge status: Home / Self Care | Payer: Medicaid Other | Attending: Emergency Medicine | Admitting: Emergency Medicine

## 2020-04-20 ENCOUNTER — Encounter (HOSPITAL_COMMUNITY): Payer: Self-pay

## 2020-04-20 ENCOUNTER — Encounter (HOSPITAL_COMMUNITY): Payer: Self-pay | Admitting: Emergency Medicine

## 2020-04-20 ENCOUNTER — Ambulatory Visit (HOSPITAL_COMMUNITY)
Admission: EM | Admit: 2020-04-20 | Discharge: 2020-04-20 | Disposition: A | Discharge status: Home / Self Care | Payer: Medicaid Other | Attending: Physician Assistant | Admitting: Physician Assistant

## 2020-04-20 ENCOUNTER — Other Ambulatory Visit: Payer: Self-pay

## 2020-04-20 DIAGNOSIS — R350 Frequency of micturition: Secondary | ICD-10-CM | POA: Diagnosis not present

## 2020-04-20 DIAGNOSIS — N3001 Acute cystitis with hematuria: Secondary | ICD-10-CM | POA: Diagnosis not present

## 2020-04-20 DIAGNOSIS — Z5321 Procedure and treatment not carried out due to patient leaving prior to being seen by health care provider: Secondary | ICD-10-CM | POA: Diagnosis not present

## 2020-04-20 LAB — URINALYSIS, ROUTINE W REFLEX MICROSCOPIC
Bilirubin Urine: NEGATIVE
Glucose, UA: NEGATIVE mg/dL
Ketones, ur: 20 mg/dL — AB
Nitrite: NEGATIVE
Protein, ur: NEGATIVE mg/dL
Specific Gravity, Urine: 1.004 — ABNORMAL LOW (ref 1.005–1.030)
WBC, UA: >50 WBC/hpf — ABNORMAL HIGH (ref 0–5)
pH: 6 (ref 5.0–8.0)

## 2020-04-20 LAB — PREGNANCY, URINE: Preg Test, Ur: NEGATIVE

## 2020-04-20 MED ORDER — SULFAMETHOXAZOLE-TRIMETHOPRIM 800-160 MG PO TABS: 1.0000 | ORAL_TABLET | Freq: Two times a day (BID) | ORAL | 0 refills | Status: DC

## 2020-04-20 NOTE — ED Triage Notes (Signed)
Pt in w/urinary frequency since midnight. States urine is cloudy, and some pain w/urination. No fevers, n/v or back pain

## 2020-04-20 NOTE — ED Notes (Signed)
Pt called for vitals with no response. 

## 2020-04-20 NOTE — ED Triage Notes (Signed)
Pt c/o urinary frequency, urgency, burning with small amount of hematuria onset at approx 0300. Denies abdominal/flank/back pain, n/v/d, fever, chills. No OTC meds used.

## 2020-04-20 NOTE — ED Provider Notes (Signed)
[facility]    CSN: [id number] Arrival date & time: 04/20/20  1005      History   Chief Complaint Chief Complaint  Patient presents with  . Dysuria    HPI Tamara Church is a 44 y.o. female.   Patient presents for concern of UTI.  Symptoms started abruptly this morning at 3:00 with frequency urgency and burning with urination.  Noticed some blood.  No flank pain, back pain, nausea, vomiting, fevers or chills.  Otherwise feels well.  She reports she was up most of the evening and night with symptoms.  Went to the emergency department this morning did not stay to be seen.  Urinalysis conducted there.     Past Medical History:  Diagnosis Date  . Anemia   . GERD (gastroesophageal reflux disease)   . Hypertension     Patient Active Problem List   Diagnosis Date Noted  . Palpitations 03/22/2020  . Adjustment disorder with depressed mood 03/15/2020  . Seasonal allergies 02/08/2020  . Decreased hearing 12/15/2019  . CKD (chronic kidney disease) stage 2, GFR 60-89 ml/min 03/21/2012  . ANEMIA 09/07/2008  . GERD 09/04/2008  . OBESITY, NOS 11/18/2006  . HYPERTENSION, BENIGN SYSTEMIC 11/18/2006    Past Surgical History:  Procedure Laterality Date  . NO PAST SURGERIES      OB History    Gravida  6   Para  6   Term  6   Preterm  0   AB  0   Living  6     SAB  0   TAB  0   Ectopic  0   Multiple  0   Live Births               Home Medications    Prior to Admission medications   Medication Sig Start Date End Date Taking? Authorizing Provider  amLODipine (NORVASC) 10 MG tablet TAKE 1 TABLET BY MOUTH AT BEDTIME Patient taking differently: Take 10 mg by mouth daily.  05/17/19  Yes Winfrey, Alcario Drought, MD  esomeprazole (NEXIUM) 40 MG capsule Take 1 capsule (40 mg total) by mouth daily at 12 noon. 04/15/20  Yes Lurline Del, DO  levonorgestrel (MIRENA) 20 MCG/24HR IUD 1 each by Intrauterine route once.   Yes [provider]  azelastine  (ASTELIN) 0.1 % nasal spray Place 2 sprays into both nostrils 2 (two) times daily. Use in each nostril as directed 02/08/20   Matilde Haymaker, MD  Blood Pressure Monitoring (ADULT BLOOD PRESSURE CUFF LG) KIT 1 each by Does not apply route as needed (BP measurement). 04/15/20   Lurline Del, DO  famotidine (PEPCID) 20 MG tablet Take 1 tablet (20 mg total) by mouth 2 (two) times daily. 07/14/19   Kathrene Alu, MD  fluticasone (FLONASE) 50 MCG/ACT nasal spray Place 1 spray into both nostrils daily. Patient not taking: Reported on 12/22/2019 11/29/19 12/30/19  Domenic Moras, PA-C  levocetirizine (XYZAL) 5 MG tablet Take 1 tablet (5 mg total) by mouth every evening. Patient not taking: Reported on 03/13/2020 02/08/20 03/09/20  Matilde Haymaker, MD  promethazine-dextromethorphan (PROMETHAZINE-DM) 6.25-15 MG/5ML syrup Take 5 mLs by mouth 4 (four) times daily as needed for cough. 03/04/20   Raylene Everts, MD  sucralfate (CARAFATE) 1 g tablet Take 1 tablet (1 g total) by mouth 4 (four) times daily -  with meals and at bedtime. 03/21/20   Orpah Greek, MD  sulfamethoxazole-trimethoprim (BACTRIM DS) 800-160 MG tablet Take 1 tablet by  mouth 2 (two) times daily for 5 days. 04/20/20 04/25/20  Camay Pedigo, Marguerita Beards, PA-C    Family History Family History  Problem Relation Age of Onset  . Hypertension Mother     Social History Social History   Tobacco Use  . Smoking status: Never Smoker  . Smokeless tobacco: Never Used  Vaping Use  . Vaping Use: Never used  Substance Use Topics  . Alcohol use: No  . Drug use: No     Allergies   Ace inhibitors, Angiotensin receptor blockers, and Hctz [hydrochlorothiazide]   Review of Systems Review of Systems   Physical Exam Triage Vital Signs ED Triage Vitals  Enc Vitals Group     BP      Pulse      Resp      Temp      Temp src      SpO2      Weight      Height      Head Circumference      Peak Flow      Pain Score      Pain Loc      Pain Edu?      Excl.  in [place]?    No data found.  Updated Vital Signs BP (!) 128/89 (BP Location: Left Arm)   Pulse (!) 114   Temp 98.1 F (36.7 C) (Oral)   Resp 16   LMP 04/08/2020 (Approximate)   SpO2 100%   Visual Acuity Right Eye Distance:   Left Eye Distance:   Bilateral Distance:    Right Eye Near:   Left Eye Near:    Bilateral Near:     Physical Exam Vitals and nursing note reviewed.  Constitutional:      General: She is not in acute distress.    Appearance: She is well-developed. She is not ill-appearing or diaphoretic.  HENT:     Head: Normocephalic and atraumatic.     Mouth/Throat:     Mouth: Mucous membranes are moist.  Eyes:     Conjunctiva/sclera: Conjunctivae normal.  Cardiovascular:     Rate and Rhythm: Regular rhythm. Tachycardia present.     Heart sounds: No murmur heard.   Pulmonary:     Effort: Pulmonary effort is normal. No respiratory distress.     Breath sounds: Normal breath sounds. No wheezing, rhonchi or rales.  Abdominal:     Palpations: Abdomen is soft.     Tenderness: There is no abdominal tenderness. There is no right CVA tenderness or left CVA tenderness.  Musculoskeletal:     Cervical back: Neck supple.     Right lower leg: No edema.     Left lower leg: No edema.  Skin:    General: Skin is warm and dry.  Neurological:     Mental Status: She is alert.      UC Treatments / Results  Labs (all labs ordered are listed, but only abnormal results are displayed) Labs Reviewed  URINE CULTURE   Urinalysis from emergency department visit this a.m. at [phone] reviewed -Positive for large leukocytes and blood.  EKG   Radiology No results found.  Procedures Procedures (including critical care time)  Medications Ordered in UC Medications - No data to display  Initial Impression / Assessment and Plan / UC Course  I have reviewed the triage vital signs and the nursing notes.  Pertinent labs & imaging results that were available during my care of the  patient were reviewed by me and considered  in my medical decision making (see chart for details).     #Cystitis Patient is a 44 year old presenting with acute cystitis.  Urinalysis conducted emergency department, reviewed.  Culture sent.  Mild tachycardia here, likely secondary to lack of sleep this morning and overnight.  She is afebrile and well-appearing without sign of more serious infection.  Does not appear to be clinically dehydrated.  We will treat her with Bactrim and instruct her to hydrate and rest.  Patient has follow-up with primary care on 04/22/2020, instructed her to keep this appointment.  Emergency department and return precautions discussed.  Patient verbalized understanding plan of care Final Clinical Impressions(s) / UC Diagnoses   Final diagnoses:  Acute cystitis with hematuria     Discharge Instructions     Take the antibiotic as prescribed  Hydrate and rest  If developing fever, vomting, severe back pain return or go to the emergency department  If chest pain, shortness of breath , go to the emergency department  Follow up with your PCP at your schedule appointment       ED Prescriptions    Medication Sig Dispense Auth. Provider   sulfamethoxazole-trimethoprim (BACTRIM DS) 800-160 MG tablet Take 1 tablet by mouth 2 (two) times daily for 5 days. 10 tablet Maxi Carreras, Marguerita Beards, PA-C     PDMP not reviewed this encounter.   Purnell Shoemaker, PA-C 04/20/20 1037

## 2020-04-20 NOTE — ED Notes (Signed)
No answer, called multiple times, moving pt OTF

## 2020-04-20 NOTE — Discharge Instructions (Addendum)
Take the antibiotic as prescribed  Hydrate and rest  If developing fever, vomting, severe back pain return or go to the emergency department  If chest pain, shortness of breath , go to the emergency department  Follow up with your PCP at your schedule appointment

## 2020-04-20 NOTE — ED Notes (Signed)
Pt called for vitals x2 with no response 

## 2020-04-22 ENCOUNTER — Encounter: Payer: Self-pay | Admitting: Family Medicine

## 2020-04-22 ENCOUNTER — Ambulatory Visit: Payer: Medicaid Other | Admitting: Family Medicine

## 2020-04-22 ENCOUNTER — Ambulatory Visit: Payer: Medicaid Other

## 2020-04-22 ENCOUNTER — Telehealth (HOSPITAL_COMMUNITY): Payer: Self-pay | Admitting: Emergency Medicine

## 2020-04-22 LAB — URINE CULTURE: Culture: 80000 — AB

## 2020-04-22 MED ORDER — NITROFURANTOIN MONOHYD MACRO 100 MG PO CAPS: 100.0000 mg | ORAL_CAPSULE | Freq: Two times a day (BID) | ORAL | 0 refills | Status: DC

## 2020-04-22 NOTE — Patient Instructions (Signed)
Visit Information  Goals Addressed              This Visit's Progress   .  I woke up urinating a lot (pt-stated)        CARE PLAN ENTRY (see longitudinal plan of care for additional care plan information)  Current Barriers:  . Chronic Disease Management support and education needs related to post discharge instructions on 04/20/20- including medications, and MD follow-up  = patient states that she woke up at 3 am in the morning and started to urinate and was unable to stop it scared her.  She went to the ER.  Nurse Case Manager Clinical Goal(s):  Marland Kitchen Over the next 10- days, patient will verbalize understanding of plan for medications and MD follow-up  Interventions:  . Inter-disciplinary care team collaboration (see longitudinal plan of care) . Evaluation of current treatment plan related to *post discharge instuctions and patient's adherence to plan as established by provider. . Reviewed medications with patient and discussed importance of taking all of antibiotics- the patient states that she has her medications and she is taking it. . Discussed plans with patient for ongoing care management follow up and provided patient with direct contact information for care management team . Reviewed scheduled/upcoming provider appointments including: 04/23/20 210 pm Dr. Homero Fellers.  Patient is aware of the appointment and states that she has transportation.  Patient Self Care Activities:  . Patient verbalizes understanding of plan to follow discharge instructions . Attends all scheduled provider appointments . Performs ADL's independently . Performs IADL's independently . Calls provider office for new concerns or questions   Initial goal documentation        Ms. Spiewak was given information about Care Management services today including:  1. Care Management services include personalized support from designated clinical staff supervised by her physician, including individualized plan of care and  coordination with other care providers 2. 24/7 contact phone numbers for assistance for urgent and routine care needs. 3. The patient may stop CCM services at any time (effective at the end of the month) by phone call to the office staff.  Patient agreed to services and verbal consent obtained.   The patient verbalized understanding of instructions provided today and declined a print copy of patient instruction materials.    RNCM will follow up with the patient at the next scheduled call.  [name] RN, BSN, [facility] Care Management Coordinator [facility] Phone: [phone] Fax: [phone]

## 2020-04-22 NOTE — Chronic Care Management (AMB) (Signed)
  Care Management   Follow Up Note   04/22/2020 Name: Tamara Church MRN: [id number] DOB: 1975/10/24  Referred by: Fayette Pho, MD Reason for referral : No chief complaint on file.   Tamara Church is a 44 y.o. year old female who is a primary care patient of Fayette Pho, MD. The care management team was consulted for assistance with care management and care coordination needs.    Review of patient status, including review of consultants reports, relevant laboratory and other test results, and collaboration with appropriate care team members and the patient's provider was performed as part of comprehensive patient evaluation and provision of chronic care management services.    SDOH (Social Determinants of Health) assessments performed: No See Care Plan activities for detailed interventions related to [facility])     Advanced Directives: See Care Plan and Vynca application for related entries.   Goals Addressed              This Visit's Progress   .  I woke up urinating a lot (pt-stated)        CARE PLAN ENTRY (see longitudinal plan of care for additional care plan information)  Current Barriers:  . Chronic Disease Management support and education needs related to post discharge instructions on 04/20/20- including medications, and MD follow-up  = patient states that she woke up at 3 am in the morning and started to urinate and was unable to stop it scared her.  She went to the ER.  Nurse Case Manager Clinical Goal(s):  Marland Kitchen Over the next 10- days, patient will verbalize understanding of plan for medications and MD follow-up  Interventions:  . Inter-disciplinary care team collaboration (see longitudinal plan of care) . Evaluation of current treatment plan related to *post discharge instuctions and patient's adherence to plan as established by provider. . Reviewed medications with patient and discussed importance of taking all of antibiotics- the patient states that she has her  medications and she is taking it. . Discussed plans with patient for ongoing care management follow up and provided patient with direct contact information for care management team . Reviewed scheduled/upcoming provider appointments including: 04/23/20 210 pm Dr. Homero Fellers.  Patient is aware of the appointment and states that she has transportation.  Patient Self Care Activities:  . Patient verbalizes understanding of plan to follow discharge instructions . Attends all scheduled provider appointments . Performs ADL's independently . Performs IADL's independently . Calls provider office for new concerns or questions   Initial goal documentation         RNCM will follow up with the patient at the next scheduled call.  [name] RN, BSN, [facility] Care Management Coordinator [facility] Phone: [phone] Fax: [phone]

## 2020-04-23 ENCOUNTER — Encounter: Payer: Self-pay | Admitting: Family Medicine

## 2020-04-23 ENCOUNTER — Ambulatory Visit (INDEPENDENT_AMBULATORY_CARE_PROVIDER_SITE_OTHER): Payer: Medicaid Other | Admitting: Family Medicine

## 2020-04-23 ENCOUNTER — Other Ambulatory Visit: Payer: Self-pay

## 2020-04-23 DIAGNOSIS — K219 Gastro-esophageal reflux disease without esophagitis: Secondary | ICD-10-CM

## 2020-04-23 DIAGNOSIS — N390 Urinary tract infection, site not specified: Secondary | ICD-10-CM | POA: Insufficient documentation

## 2020-04-23 DIAGNOSIS — I10 Essential (primary) hypertension: Secondary | ICD-10-CM | POA: Diagnosis not present

## 2020-04-23 HISTORY — DX: Urinary tract infection, site not specified: N39.0

## 2020-04-23 MED ORDER — CEPHALEXIN 500 MG PO CAPS: 500.0000 mg | ORAL_CAPSULE | Freq: Two times a day (BID) | ORAL | 0 refills | Status: DC

## 2020-04-23 NOTE — Assessment & Plan Note (Signed)
Persistent symptoms. -Stop Bactrim -Stop Macrobid -Start Keflex 500 mg twice daily for 5 days

## 2020-04-23 NOTE — Patient Instructions (Signed)
Urinary tract infection: Uncertain if had some trouble with antibiotics for this urinary tract infection.  I have sent in a course of antibiotics that I hope will be easier to tolerate.  I have called in Keflex 500 mg 3 to take twice daily for the next 5 days.  Please come back if you notice worsening symptoms like worsening abdominal pain causing nausea, vomiting, fever.  Hypertension: Looks like your blood pressure is well controlled today.  Based on the story you gave, not worried about this high blood pressures you measured if they were outliers.  Lets stay on the amlodipine 10 mg for now.  Reflux: I am sorry that this has been such an issue.  I am glad you gotten connected with GI.  If your heartburn problem has been fixed by medication you continue to lose weight, I want you to come back to clinic for further discussion.

## 2020-04-23 NOTE — Progress Notes (Signed)
    SUBJECTIVE:   CHIEF COMPLAINT / HPI:   Hypertension Tamara Church is following up in clinic today for hypertension.  In the past several weeks, she has had several changes to her antihypertensive regimen.  She is currently taking amlodipine 10 mg daily.  She is instructed to take her blood pressure 3 times a day daily for 1 week.  She reports that she has been following those instructions but she forgot to bring her record with her.  She reports that for the most part her systolic pressures were 129-130 at her diastolic pressures were 73-79.  When she was feeling unwell and ended up going to the ED, she did have some elevated pressures up to 149/89.  UTI She was seen by urgent care over the weekend for abdominal pain and discomfort with urination.  She was diagnosed with a UTI and sent home with Bactrim.  She took Bactrim for 3 days until she was called yesterday and instructed to stop taking Bactrim and start taking Macrobid.  This was based off of sensitivities from a urine culture.  She reports she took 1 dose of Macrobid and experienced significant stomach discomfort did not want to take any additional Macrobid.  She would like to know if she can take any other medications to clear her UTI.  She reports she continues to have mild discomfort with urination and frequent urination.  GERD As a side note, she reports that she has been having severe heartburn symptoms lately which is been inhibiting her ability to eat or enjoy meals at all.  She notes she has had a 15-20 pound weight loss in the past 4 months as a result of this discomfort with eating.  She was seen on 7/26 and prescribed omeprazole and also referred to GI for further work-up based on the severity of her symptoms.  She has had significant improvement in her symptoms with omeprazole.   PERTINENT  PMH / PSH: Hypertension, GERD, CKD  OBJECTIVE:   BP 120/72   Pulse 88   Ht 5\' 2"  (1.575 m)   Wt 195 lb 4 oz (88.6 kg)   LMP  04/08/2020 (Approximate)   SpO2 98%   BMI 35.71 kg/m    General: Alert and cooperative and appears to be in no acute distress   Pulm: Normal respiratory effort Abdomen: Bowel sounds normal. Abdomen soft and non-tender.  No CVA tenderness.  Mild tenderness with deep palpation of the suprapubic area.  Mild discomfort with pressure in the epigastric area. Extremities: No peripheral edema. Warm/ well perfused.  Strong radial pulse. Neuro: Cranial nerves grossly intact   ASSESSMENT/PLAN:   HYPERTENSION, BENIGN SYSTEMIC Well-controlled. -Continue amlodipine 10 mg daily  GERD -Continue omeprazole -Follow-up with GI -Come back to clinic if weight loss does not resolve with treatment of GERD symptoms  UTI (urinary tract infection) Persistent symptoms. -Stop Bactrim -Stop Macrobid -Start Keflex 500 mg twice daily for 5 days     04/10/2020, MD [facility]

## 2020-04-23 NOTE — Assessment & Plan Note (Signed)
Well-controlled.  Continue amlodipine 10 mg daily 

## 2020-04-23 NOTE — Assessment & Plan Note (Signed)
-  Continue omeprazole -Follow-up with GI -Come back to clinic if weight loss does not resolve with treatment of GERD symptoms

## 2020-04-24 ENCOUNTER — Telehealth: Payer: Self-pay | Admitting: *Deleted

## 2020-04-24 NOTE — Chronic Care Management (AMB) (Signed)
  Care Management   Note  04/24/2020 Name: Tamara Church MRN: [id number] DOB: 21-Jan-1976  Tamara Church is a 44 y.o. year old female who is a primary care patient of Fayette Pho, MD and is actively engaged with the care management team. I reached out to Tamara Church by phone today to assist with re-scheduling a follow up visit with the RN Case Manager  Follow up plan: Telephone appointment with care management team member scheduled for: 05/09/2020  Gwenevere Ghazi  Care Guide, Embedded Care Coordination [facility]  [place], [place] [address] Direct Dial: [phone] [email or web address]: [email or web address]

## 2020-04-25 ENCOUNTER — Ambulatory Visit: Payer: Medicaid Other | Admitting: Nurse Practitioner

## 2020-04-25 ENCOUNTER — Encounter: Payer: Self-pay | Admitting: Nurse Practitioner

## 2020-04-25 VITALS — BP 124/82 | HR 70 | Ht 62.0 in | Wt 195.0 lb

## 2020-04-25 DIAGNOSIS — R6881 Early satiety: Secondary | ICD-10-CM

## 2020-04-25 DIAGNOSIS — R1013 Epigastric pain: Secondary | ICD-10-CM

## 2020-04-25 NOTE — Progress Notes (Signed)
04/25/2020 Tamara Church [id number] [birth year]   CHIEF COMPLAINT: upper abdominal pain, gets full easily   HISTORY OF PRESENT ILLNESS: Tamara Church is a 44 year old female with a past medical history of anemia, hypertension and GERD. No past surgical history.  She presents her office today for further evaluation regarding acid reflux and early satiety symptoms which started approximately 6 weeks ago.  She reports having frequent heartburn and epigastric pain.  She takes a few bites of food and feels full easily.  No nausea or vomiting.  He has unintentionally lost 7 to 8 pounds over the past 6 weeks.  She previously took aspirin 325 mg 3 tabs once daily several days weekly for headaches.  However, she stopped taking aspirin 4 months ago.  No other NSAID use.  She presented to [facility] emergency room on 03/21/2020 with worsening upper abdominal pain which radiated up into her chest and throat.  She received Pepcid, Zofran and a [name] and her symptoms improved.  Labs in the ED showed a glucose level of 126.  Alk phos 54.  AST 17.  ALT 18.  Total bili 0.9.  Troponin 2.  A 12 lead EKG showed sinus tachycardia.  WBC 9.7.  Hemoglobin 13.  Hematocrit 40.4.  Platelet 326.  An abdominal sonogram showed a small gallstone without evidence of cholecystitis.  She was discharged home on Protonix 40 mg once daily and Carafate 1 p.o. 3 times daily. She presented to [facility] ED on 04/05/2020 with worsening epigastric pain which started after eating a chicken pot pie. Her labs and EKG were stable. She was assessed to have acid reflux. She was advised to continue PPI and to follow up in our office.  Pantoprazole was changed to Esomeprazole 40 mg daily.  Her epigastric pain has lessened since taking Esomeprazole. She is currently taking Cephalexin for a UTI. Her 29 year old son died several months ago. She continues to grieve.     CBC Latest Ref Rng & Units 04/05/2020 03/21/2020 03/20/2020  WBC 4.0 - 10.5  K/uL 5.8 9.7 6.7  Hemoglobin 12.0 - 15.0 g/dL 12.7 13.0 11.9(L)  Hematocrit 36 - 46 % 39.0 40.4 36.7  Platelets 150 - 400 K/uL 241 326 321    CMP Latest Ref Rng & Units 04/05/2020 03/22/2020 03/21/2020  Glucose 70 - 99 mg/dL 111(H) 83 126(H)  BUN 6 - 20 mg/dL 6 9 8   Creatinine 0.44 - 1.00 mg/dL 0.77 0.88 0.82  Sodium 135 - 145 mmol/L 139 138 139  Potassium 3.5 - 5.1 mmol/L 3.5 3.9 3.6  Chloride 98 - 111 mmol/L 102 100 101  CO2 22 - 32 mmol/L 25 21 25   Calcium 8.9 - 10.3 mg/dL 10.0 9.8 9.9  Total Protein 6.0 - 8.5 g/dL - 8.2 9.2(H)  Total Bilirubin 0.0 - 1.2 mg/dL - 0.4 0.9  Alkaline Phos 48 - 121 IU/L - 57 54  AST 0 - 40 IU/L - 14 17  ALT 0 - 32 IU/L - 11 18  Lipase 24 on 03/20/2020  Abdominal  ultrasound 03/21/2020: 1. Mild cholelithiasis. No other sonographic features to suggest acute cholecystitis. 2. No biliary dilatation. 3. Normal sonographic evaluation of the liver. No other acute abnormality identified.   Past Medical History:  Diagnosis Date  . Anemia   . GERD (gastroesophageal reflux disease)   . Hypertension    Past Surgical History:  Procedure Laterality Date  . NO PAST SURGERIES  Family History: Father died 68 from MI, diabetes. Mother age 19 HTN, acid reflux and thyroid disease. 5 Brothers all healthy.  Social History: She is married. She has 4 sons and 2 daughters, son age 45 recently died. She  reports that she has never smoked. She has never used smokeless tobacco. She reports that she does not drink alcohol and does not use drugs.   Allergies  Allergen Reactions  . Ace Inhibitors Cough  . Angiotensin Receptor Blockers     cough  . Hctz [Hydrochlorothiazide]     hypokalemia      Outpatient Encounter Medications as of 04/25/2020  Medication Sig  . amLODipine (NORVASC) 10 MG tablet TAKE 1 TABLET BY MOUTH AT BEDTIME (Patient taking differently: Take 10 mg by mouth daily. )  . cephALEXin (KEFLEX) 500 MG capsule Take 1 capsule (500 mg total) by mouth  2 (two) times daily.  Marland Kitchen esomeprazole (NEXIUM) 40 MG capsule Take 1 capsule (40 mg total) by mouth daily at 12 noon.  Marland Kitchen levonorgestrel (MIRENA) 20 MCG/24HR IUD 1 each by Intrauterine route once.  . promethazine-dextromethorphan (PROMETHAZINE-DM) 6.25-15 MG/5ML syrup Take 5 mLs by mouth 4 (four) times daily as needed for cough.  . sucralfate (CARAFATE) 1 g tablet Take 1 tablet (1 g total) by mouth 4 (four) times daily -  with meals and at bedtime.  . [DISCONTINUED] azelastine (ASTELIN) 0.1 % nasal spray Place 2 sprays into both nostrils 2 (two) times daily. Use in each nostril as directed  . [DISCONTINUED] Blood Pressure Monitoring (ADULT BLOOD PRESSURE CUFF LG) KIT 1 each by Does not apply route as needed (BP measurement).  . [DISCONTINUED] famotidine (PEPCID) 20 MG tablet Take 1 tablet (20 mg total) by mouth 2 (two) times daily.  . [DISCONTINUED] fluticasone (FLONASE) 50 MCG/ACT nasal spray Place 1 spray into both nostrils daily. (Patient not taking: Reported on 12/22/2019)  . [DISCONTINUED] levocetirizine (XYZAL) 5 MG tablet Take 1 tablet (5 mg total) by mouth every evening. (Patient not taking: Reported on 03/13/2020)  . [DISCONTINUED] nitrofurantoin, macrocrystal-monohydrate, (MACROBID) 100 MG capsule Take 1 capsule (100 mg total) by mouth 2 (two) times daily.  . [DISCONTINUED] sulfamethoxazole-trimethoprim (BACTRIM DS) 800-160 MG tablet Take 1 tablet by mouth 2 (two) times daily for 5 days.   No facility-administered encounter medications on file as of 04/25/2020.     REVIEW OF SYSTEMS: All other systems reviewed and negative except where noted in the History of Present Illness.   PHYSICAL EXAM: BP 124/82   Pulse 70   Ht 5' 2"  (1.575 m)   Wt 195 lb (88.5 kg)   LMP 04/08/2020 (Approximate)   BMI 35.67 kg/m  General: Well developed 44 year old female in no acute distress. Head: Normocephalic and atraumatic. Eyes:  Sclerae non-icteric, conjunctive pink. Ears: Normal auditory  acuity. Mouth: Dentition intact. No ulcers or lesions.  Neck: Supple, no lymphadenopathy or thyromegaly.  Lungs: Clear bilaterally to auscultation without wheezes, crackles or rhonchi. Heart: Regular rate and rhythm. No murmur, rub or gallop appreciated.  Abdomen: Soft, non distended. Mild epigastric tenderness without rebound or guarding. No masses. No hepatosplenomegaly. Normoactive bowel sounds x 4 quadrants.  Rectal: Deferred. Musculoskeletal: Symmetrical with no gross deformities. Skin: Warm and dry. No rash or lesions on visible extremities. Extremities: No edema. Neurological: Alert oriented x 4, no focal deficits.  Psychological:  Alert and cooperative. Normal mood and affect.  ASSESSMENT AND PLAN:  61. 44 year old female with a history of GERD with epigastric pain and  early satiety. -EGD benefits and risks discussed including risk with sedation, risk of bleeding, perforation and infection  -GERD handout -Continue Esomeprazole 68m daily  -Call our office if symptoms worsen  -Avoid ASA/NSAIDs  2. Cholelithiasis, no evidence of cholecystitis per abdominal sono 03/21/2020. Normal Lipase and LFTs  3. HTN        CC:  BMartyn Malay MD

## 2020-04-25 NOTE — Progress Notes (Signed)
Attending Physician's Attestation   I have reviewed the chart.   I agree with the Advanced Practitioner's note, impression, and recommendations with any updates as below.    Jacarra Bobak Mansouraty, MD [facility] Office # [phone]  

## 2020-04-25 NOTE — Patient Instructions (Addendum)
If you are age 43 or older, your body mass index should be between 23-30. Your Body mass index is 35.67 kg/m. If this is out of the aforementioned range listed, please consider follow up with your Primary Care Provider.  If you are age 64 or younger, your body mass index should be between 19-25. Your Body mass index is 35.67 kg/m. If this is out of the aformentioned range listed, please consider follow up with your Primary Care Provider.   Gastroesophageal Reflux Disease, Adult Gastroesophageal reflux (GER) happens when acid from the stomach flows up into the tube that connects the mouth and the stomach (esophagus). Normally, food travels down the esophagus and stays in the stomach to be digested. With GER, food and stomach acid sometimes move back up into the esophagus. You may have a disease called gastroesophageal reflux disease (GERD) if the reflux:  Happens often.  Causes frequent or very bad symptoms.  Causes problems such as damage to the esophagus. When this happens, the esophagus becomes sore and swollen (inflamed). Over time, GERD can make small holes (ulcers) in the lining of the esophagus. What are the causes? This condition is caused by a problem with the muscle between the esophagus and the stomach. When this muscle is weak or not normal, it does not close properly to keep food and acid from coming back up from the stomach. The muscle can be weak because of:  Tobacco use.  Pregnancy.  Having a certain type of hernia (hiatal hernia).  Alcohol use.  Certain foods and drinks, such as coffee, chocolate, onions, and peppermint. What increases the risk? You are more likely to develop this condition if you:  Are overweight.  Have a disease that affects your connective tissue.  Use NSAID medicines. What are the signs or symptoms? Symptoms of this condition include:  Heartburn.  Difficult or painful swallowing.  The feeling of having a lump in the throat.  A bitter  taste in the mouth.  Bad breath.  Having a lot of saliva.  Having an upset or bloated stomach.  Belching.  Chest pain. Different conditions can cause chest pain. Make sure you see your doctor if you have chest pain.  Shortness of breath or noisy breathing (wheezing).  Ongoing (chronic) cough or a cough at night.  Wearing away of the surface of teeth (tooth enamel).  Weight loss. How is this treated? Treatment will depend on how bad your symptoms are. Your doctor may suggest:  Changes to your diet.  Medicine.  Surgery. Follow these instructions at home: Eating and drinking   Follow a diet as told by your doctor. You may need to avoid foods and drinks such as: ? Coffee and tea (with or without caffeine). ? Drinks that contain alcohol. ? Energy drinks and sports drinks. ? Bubbly (carbonated) drinks or sodas. ? Chocolate and cocoa. ? Peppermint and mint flavorings. ? Garlic and onions. ? Horseradish. ? Spicy and acidic foods. These include peppers, chili powder, curry powder, vinegar, hot sauces, and BBQ sauce. ? Citrus fruit juices and citrus fruits, such as oranges, lemons, and limes. ? Tomato-based foods. These include red sauce, chili, salsa, and pizza with red sauce. ? Fried and fatty foods. These include donuts, french fries, potato chips, and high-fat dressings. ? High-fat meats. These include hot dogs, rib eye steak, sausage, ham, and bacon. ? High-fat dairy items, such as whole milk, butter, and cream cheese.  Eat small meals often. Avoid eating large meals.  Avoid drinking large  amounts of liquid with your meals.  Avoid eating meals during the 2-3 hours before bedtime.  Avoid lying down right after you eat.  Do not exercise right after you eat. Lifestyle   Do not use any products that contain nicotine or tobacco. These include cigarettes, e-cigarettes, and chewing tobacco. If you need help quitting, ask your doctor.  Try to lower your stress. If you  need help doing this, ask your doctor.  If you are overweight, lose an amount of weight that is healthy for you. Ask your doctor about a safe weight loss goal. General instructions  Pay attention to any changes in your symptoms.  Take over-the-counter and prescription medicines only as told by your doctor. Do not take aspirin, ibuprofen, or other NSAIDs unless your doctor says it is okay.  Wear loose clothes. Do not wear anything tight around your waist.  Raise (elevate) the head of your bed about 6 inches (15 cm).  Avoid bending over if this makes your symptoms worse.  Keep all follow-up visits as told by your doctor. This is important. Contact a doctor if:  You have new symptoms.  You lose weight and you do not know why.  You have trouble swallowing or it hurts to swallow.  You have wheezing or a cough that keeps happening.  Your symptoms do not get better with treatment.  You have a hoarse voice. Get help right away if:  You have pain in your arms, neck, jaw, teeth, or back.  You feel sweaty, dizzy, or light-headed.  You have chest pain or shortness of breath.  You throw up (vomit) and your throw-up looks like blood or coffee grounds.  You pass out (faint).  Your poop (stool) is bloody or black.  You cannot swallow, drink, or eat. Summary  If a person has gastroesophageal reflux disease (GERD), food and stomach acid move back up into the esophagus and cause symptoms or problems such as damage to the esophagus.  Treatment will depend on how bad your symptoms are.  Follow a diet as told by your doctor.  Take all medicines only as told by your doctor. This information is not intended to replace advice given to you by your health care provider. Make sure you discuss any questions you have with your health care provider. Document Revised: 03/16/2018 Document Reviewed: 03/16/2018 Elsevier Patient Education  2020 [employer].  Food Choices for Gastroesophageal  Reflux Disease, Adult When you have gastroesophageal reflux disease (GERD), the foods you eat and your eating habits are very important. Choosing the right foods can help ease your discomfort. Think about working with a nutrition specialist (dietitian) to help you make good choices. What are tips for following this plan?  Meals  Choose healthy foods that are low in fat, such as fruits, vegetables, whole grains, low-fat dairy products, and lean meat, fish, and poultry.  Eat small meals often instead of 3 large meals a day. Eat your meals slowly, and in a place where you are relaxed. Avoid bending over or lying down until 2-3 hours after eating.  Avoid eating meals 2-3 hours before bed.  Avoid drinking a lot of liquid with meals.  Cook foods using methods other than frying. Bake, grill, or broil food instead.  Avoid or limit: ? Chocolate. ? Peppermint or spearmint. ? Alcohol. ? Pepper. ? Black and decaffeinated coffee. ? Black and decaffeinated tea. ? Bubbly (carbonated) soft drinks. ? Caffeinated energy drinks and soft drinks.  Limit high-fat foods such as: ?  Fatty meat or fried foods. ? Whole milk, cream, butter, or ice cream. ? Nuts and nut butters. ? Pastries, donuts, and sweets made with butter or shortening.  Avoid foods that cause symptoms. These foods may be different for everyone. Common foods that cause symptoms include: ? Tomatoes. ? Oranges, lemons, and limes. ? Peppers. ? Spicy food. ? Onions and garlic. ? Vinegar. Lifestyle  Maintain a healthy weight. Ask your doctor what weight is healthy for you. If you need to lose weight, work with your doctor to do so safely.  Exercise for at least 30 minutes for 5 or more days each week, or as told by your doctor.  Wear loose-fitting clothes.  Do not smoke. If you need help quitting, ask your doctor.  Sleep with the head of your bed higher than your feet. Use a wedge under the mattress or blocks under the bed frame  to raise the head of the bed. Summary  When you have gastroesophageal reflux disease (GERD), food and lifestyle choices are very important in easing your symptoms.  Eat small meals often instead of 3 large meals a day. Eat your meals slowly, and in a place where you are relaxed.  Limit high-fat foods such as fatty meat or fried foods.  Avoid bending over or lying down until 2-3 hours after eating.  Avoid peppermint and spearmint, caffeine, alcohol, and chocolate. This information is not intended to replace advice given to you by your health care provider. Make sure you discuss any questions you have with your health care provider. Document Revised: 12/29/2018 Document Reviewed: 10/13/2016 Elsevier Patient Education  2020 [employer] Inc[employer]   Continue Esomeprazole 40 mg QD  Call the office if your symptoms worsen [phone]  Due to recent changes in healthcare laws, you may see the results of your imaging and laboratory studies on MyChart before your provider has had a chance to review them.  We understand that in some cases there may be results that are confusing or concerning to you. Not all laboratory results come back in the same time frame and the provider may be waiting for multiple results in order to interpret others.  Please give [place] 48 hours in order for your provider to thoroughly review all the results before contacting the office for clarification of your results.   Thank you for choosing [facility] Arnaldo Natal, CRNP

## 2020-04-29 ENCOUNTER — Other Ambulatory Visit: Payer: Self-pay | Admitting: Gastroenterology

## 2020-04-29 ENCOUNTER — Ambulatory Visit (INDEPENDENT_AMBULATORY_CARE_PROVIDER_SITE_OTHER): Payer: Medicaid Other

## 2020-04-29 ENCOUNTER — Other Ambulatory Visit: Payer: Self-pay

## 2020-04-29 DIAGNOSIS — Z1159 Encounter for screening for other viral diseases: Secondary | ICD-10-CM

## 2020-04-29 LAB — SARS CORONAVIRUS 2 (TAT 6-24 HRS): SARS Coronavirus 2: NEGATIVE

## 2020-04-29 NOTE — Progress Notes (Signed)
Hey I didn't see this patient

## 2020-05-01 ENCOUNTER — Ambulatory Visit (AMBULATORY_SURGERY_CENTER): Payer: Medicaid Other | Admitting: Gastroenterology

## 2020-05-01 ENCOUNTER — Other Ambulatory Visit: Payer: Self-pay

## 2020-05-01 ENCOUNTER — Encounter: Payer: Self-pay | Admitting: Gastroenterology

## 2020-05-01 VITALS — BP 128/88 | HR 118 | Temp 98.6°F | Resp 16 | Ht 62.0 in | Wt 195.0 lb

## 2020-05-01 DIAGNOSIS — K319 Disease of stomach and duodenum, unspecified: Secondary | ICD-10-CM

## 2020-05-01 DIAGNOSIS — K219 Gastro-esophageal reflux disease without esophagitis: Secondary | ICD-10-CM

## 2020-05-01 DIAGNOSIS — R1013 Epigastric pain: Secondary | ICD-10-CM

## 2020-05-01 DIAGNOSIS — K3189 Other diseases of stomach and duodenum: Secondary | ICD-10-CM

## 2020-05-01 DIAGNOSIS — I10 Essential (primary) hypertension: Secondary | ICD-10-CM | POA: Diagnosis not present

## 2020-05-01 DIAGNOSIS — K228 Other specified diseases of esophagus: Secondary | ICD-10-CM

## 2020-05-01 MED ORDER — SODIUM CHLORIDE 0.9 % IV SOLN: 500.0000 mL | Freq: Once | INTRAVENOUS | Status: DC

## 2020-05-01 MED ORDER — ESOMEPRAZOLE MAGNESIUM 40 MG PO CPDR: 40.0000 mg | DELAYED_RELEASE_CAPSULE | Freq: Every day | ORAL | 0 refills | Status: DC

## 2020-05-01 NOTE — Progress Notes (Signed)
Robinul 0.1 mg IV given due large amount of secretions upon assessment.  MD made aware, vss 

## 2020-05-01 NOTE — Op Note (Signed)
[place] Patient Name: Tamara Church Procedure Date: 05/01/2020 10:31 AM MRN: [id number] Endoscopist: Justice Britain , MD Age: 44 Referring MD:  Date of Birth: 10-21-1975 Gender: Female Account #: [id number] Procedure:                Upper GI endoscopy Indications:              Epigastric abdominal pain, Anorexia, Early satiety Medicines:                Monitored Anesthesia Care Procedure:                Pre-Anesthesia Assessment:                           - Prior to the procedure, a History and Physical                            was performed, and patient medications and                            allergies were reviewed. The patient's tolerance of                            previous anesthesia was also reviewed. The risks                            and benefits of the procedure and the sedation                            options and risks were discussed with the patient.                            All questions were answered, and informed consent                            was obtained. Prior Anticoagulants: The patient has                            taken no previous anticoagulant or antiplatelet                            agents. ASA Grade Assessment: II - A patient with                            mild systemic disease. After reviewing the risks                            and benefits, the patient was deemed in                            satisfactory condition to undergo the procedure.                           After obtaining informed consent, the endoscope was  passed under direct vision. Throughout the                            procedure, the patient's blood pressure, pulse, and                            oxygen saturations were monitored continuously. The                            Endoscope was introduced through the mouth, and                            advanced to the second part of duodenum. The upper                             GI endoscopy was accomplished without difficulty.                            The patient tolerated the procedure. Scope In: Scope Out: Findings:                 No gross lesions were noted in the entire                            esophagus. Biopsies were taken with a cold forceps                            for histology to rule out EoE/LoE.                           The Z-line was irregular and was found 36 cm from                            the incisors.                           Patchy mildly erythematous mucosa without bleeding                            was found in the gastric body.                           No other gross lesions were noted in the entire                            examined stomach. Biopsies were taken with a cold                            forceps for histology and Helicobacter pylori                            testing.                           No gross lesions were noted in the  duodenal bulb,                            in the first portion of the duodenum and in the                            second portion of the duodenum. Biopsies for                            histology were taken with a cold forceps for                            evaluation of celiac disease. Complications:            No immediate complications. Estimated Blood Loss:     Estimated blood loss was minimal. Impression:               - No gross lesions in esophagus. Biopsied for                            EoE/LoE.                           - Z-line irregular, 36 cm from the incisors.                           - Erythematous mucosa in the gastric body. No other                            gross lesions in the stomach. Biopsied.                           - No gross lesions in the duodenal bulb, in the                            first portion of the duodenum and in the second                            portion of the duodenum. Biopsied. Recommendation:           - The patient will be observed  post-procedure,                            until all discharge criteria are met.                           - Discharge patient to home.                           - Patient has a contact number available for                            emergencies. The signs and symptoms of potential  delayed complications were discussed with the                            patient. Return to normal activities tomorrow.                            Written discharge instructions were provided to the                            patient.                           - Resume previous diet.                           - Continue Nexium 40 mg. However, take 30-60                            minutes before breakfast.                           - If patient continuing to have symptoms then                            increase Nexium to 40 mg twice daily to be taken                            before breakfast and dinner.                           - If not taking Carafate at this time then it will                            be removed from the patient list. Can consider use                            in future.                           - Continue present medications otherwise.                           - Await pathology results.                           - Follow up in clinic to be dictated by final                            results.                           - pH Impedence and Manometry could be considered in                            future after optimization/maximum acid suppression  is met if things persist.                           - If early satiety/fullness persists then consider                            SF-GES.                           - The findings and recommendations were discussed                            with the patient. Justice Britain, MD 05/01/2020 10:55:30 AM

## 2020-05-01 NOTE — Progress Notes (Signed)
Called to room to assist during endoscopic procedure.  Patient ID and intended procedure confirmed with present staff. Received instructions for my participation in the procedure from the performing physician.  

## 2020-05-01 NOTE — Progress Notes (Signed)
Report given to PACU, vss 

## 2020-05-01 NOTE — Progress Notes (Signed)
HR > 100 with esmolol 30 mg given IV, MD updated, vss

## 2020-05-01 NOTE — Patient Instructions (Signed)
Thank you for allowing [place] to care for you today!  Await pathology results, we will be in touch with results and recommendations if needed.  Continue to take Nexium daily, change time to 30-60 minutes before your first meal.   IF this does not help control symptoms reach out by phone or MyChart and discuss, so we make increase your doseage.  Resume other medications and diet today.  Return to your normal activities tomorrow.    YOU HAD AN ENDOSCOPIC PROCEDURE TODAY AT THE [facility]:   Refer to the procedure report that was given to you for any specific questions about what was found during the examination.  If the procedure report does not answer your questions, please call your gastroenterologist to clarify.  If you requested that your care partner not be given the details of your procedure findings, then the procedure report has been included in a sealed envelope for you to review at your convenience later.  YOU SHOULD EXPECT: Some feelings of bloating in the abdomen. Passage of more gas than usual.  Walking can help get rid of the air that was put into your GI tract during the procedure and reduce the bloating. If you had a lower endoscopy (such as a colonoscopy or flexible sigmoidoscopy) you may notice spotting of blood in your stool or on the toilet paper. If you underwent a bowel prep for your procedure, you may not have a normal bowel movement for a few days.  Please Note:  You might notice some irritation and congestion in your nose or some drainage.  This is from the oxygen used during your procedure.  There is no need for concern and it should clear up in a day or so.  SYMPTOMS TO REPORT IMMEDIATELY:   Following upper endoscopy (EGD)  Vomiting of blood or coffee ground material  New chest pain or pain under the shoulder blades  Painful or persistently difficult swallowing  New shortness of breath  Fever of 100F or higher  Black, tarry-looking stools  For urgent  or emergent issues, a gastroenterologist can be reached at any hour by calling [phone]. Do not use MyChart messaging for urgent concerns.    DIET:  We do recommend a small meal at first, but then you may proceed to your regular diet.  Drink plenty of fluids but you should avoid alcoholic beverages for 24 hours.  ACTIVITY:  You should plan to take it easy for the rest of today and you should NOT DRIVE or use heavy machinery until tomorrow (because of the sedation medicines used during the test).    FOLLOW UP: Our staff will call the number listed on your records 48-72 hours following your procedure to check on you and address any questions or concerns that you may have regarding the information given to you following your procedure. If we do not reach you, we will leave a message.  We will attempt to reach you two times.  During this call, we will ask if you have developed any symptoms of COVID 19. If you develop any symptoms (ie: fever, flu-like symptoms, shortness of breath, cough etc.) before then, please call [phone].  If you test positive for Covid 19 in the 2 weeks post procedure, please call and report this information to [place].    If any biopsies were taken you will be contacted by phone or by letter within the next 1-3 weeks.  Please call us at [phone] if you have not heard  about the biopsies in 3 weeks.    SIGNATURES/CONFIDENTIALITY: You and/or your care partner have signed paperwork which will be entered into your electronic medical record.  These signatures attest to the fact that that the information above on your After Visit Summary has been reviewed and is understood.  Full responsibility of the confidentiality of this discharge information lies with you and/or your care-partner.

## 2020-05-03 ENCOUNTER — Telehealth: Payer: Self-pay | Admitting: *Deleted

## 2020-05-03 ENCOUNTER — Telehealth: Payer: Medicaid Other

## 2020-05-03 NOTE — Telephone Encounter (Signed)
1. Have you developed a fever since your procedure? no  2.   Have you had an respiratory symptoms (SOB or cough) since your procedure? no  3.   Have you tested positive for COVID 19 since your procedure no  4.   Have you had any family members/close contacts diagnosed with the COVID 19 since your procedure?  no   If yes to any of these questions please route to Laverna Peace, RN and Karlton Lemon, RN Follow up Call-  Call back number 05/01/2020  Post procedure Call Back phone  # [phone]  Permission to leave phone message Yes  Some recent data might be hidden     Patient questions:  Do you have a fever, pain , or abdominal swelling? No. Pain Score  0 *  Have you tolerated food without any problems? Yes.    Have you been able to return to your normal activities? Yes.    Do you have any questions about your discharge instructions: Diet   No. Medications  No. Follow up visit  No.  Do you have questions or concerns about your Care? No.  Actions: * If pain score is 4 or above: No action needed, pain <4.

## 2020-05-07 ENCOUNTER — Telehealth: Payer: Self-pay | Admitting: Gastroenterology

## 2020-05-07 NOTE — Telephone Encounter (Signed)
The pt has been advised to call her PCP or GYN in regards to possible vaginal yeast. The pt has been advised of the information and verbalized understanding.

## 2020-05-08 ENCOUNTER — Other Ambulatory Visit: Payer: Self-pay

## 2020-05-08 ENCOUNTER — Encounter: Payer: Self-pay | Admitting: Family Medicine

## 2020-05-08 ENCOUNTER — Telehealth: Payer: Self-pay | Admitting: Family Medicine

## 2020-05-08 ENCOUNTER — Ambulatory Visit (INDEPENDENT_AMBULATORY_CARE_PROVIDER_SITE_OTHER): Payer: Medicaid Other | Admitting: Family Medicine

## 2020-05-08 VITALS — BP 110/72 | HR 66 | Ht 62.0 in | Wt 193.4 lb

## 2020-05-08 DIAGNOSIS — B9689 Other specified bacterial agents as the cause of diseases classified elsewhere: Secondary | ICD-10-CM | POA: Insufficient documentation

## 2020-05-08 DIAGNOSIS — N760 Acute vaginitis: Secondary | ICD-10-CM | POA: Diagnosis not present

## 2020-05-08 HISTORY — DX: Acute vaginitis: N76.0

## 2020-05-08 HISTORY — DX: Other specified bacterial agents as the cause of diseases classified elsewhere: B96.89

## 2020-05-08 LAB — POCT WET PREP (WET MOUNT)
Clue Cells Wet Prep Whiff POC: POSITIVE
Trichomonas Wet Prep HPF POC: ABSENT

## 2020-05-08 MED ORDER — METRONIDAZOLE 500 MG PO TABS: 500.0000 mg | ORAL_TABLET | Freq: Two times a day (BID) | ORAL | 0 refills | Status: AC

## 2020-05-08 NOTE — Patient Instructions (Signed)
It was very nice to meet you today. Please enjoy the rest of your week. Today you were seen for vaginal irritation. I will notify you of results. In the meantime keep the area dry, clean, and free of fragrances.   Please call the clinic at [phone] if your symptoms worsen or you have any concerns. It was our pleasure to serve you.

## 2020-05-08 NOTE — Assessment & Plan Note (Signed)
Wet mount +BV.  -Flagyl 500 mg BID x7 days sent to pharmacy -Patient phone w/ results

## 2020-05-08 NOTE — Telephone Encounter (Signed)
Patient called with results + for BV. Aware of treatment sent in to pharmacy.   Betsey Sossamon Autry-Lott, DO 05/08/2020, 1:48 PM PGY-2, [facility]

## 2020-05-08 NOTE — Progress Notes (Signed)
    SUBJECTIVE:   CHIEF COMPLAINT / HPI:   Tamara Church is a 44 yo F who presents for the issue below.   Vaginal itching Seen in urgent care 04/20/2020 for a UTI. Has taken 3 different antibiotics since then (Bactrim, Macrobid, and Keflex) recently finished a 5 day course of Keflex. This is the same time symptoms of itching and thick white vaginal discharge started. She has tried epsom salt baths with minimal relief. Does not desire STD testing today.   PERTINENT  PMH / PSH: Hx of antibiotic induced yeast infections, HTN, seaonsal allergies  OBJECTIVE:   BP 110/72   Pulse 66   Ht 5\' 2"  (1.575 m)   Wt 193 lb 6.4 oz (87.7 kg)   LMP 04/28/2020   BMI 35.37 kg/m   General: Appears well, no acute distress. Age appropriate. Pelvic exam: normal external genitalia, vulva, vagina, cervix, uterus and adnexa, WET MOUNT done - results: clue cells, excessive bacteria, exam chaperoned by 06/28/2020, RN.  ASSESSMENT/PLAN:   Bacterial vaginosis Wet mount +BV.  -Flagyl 500 mg BID x7 days sent to pharmacy -Patient phone w/ results    Tamara Bickers, DO [facility]

## 2020-05-09 ENCOUNTER — Telehealth: Payer: Medicaid Other

## 2020-05-09 ENCOUNTER — Ambulatory Visit: Payer: Medicaid Other

## 2020-05-09 NOTE — Patient Instructions (Signed)
Visit Information  Goals Addressed              This Visit's Progress   .  They changed my medications and it triggered a change in my blood pressure (pt-stated)        CARE PLAN ENTRY (see longitudinal plan of care for additional care plan information)  Current Barriers:  Marland Kitchen Knowledge Deficits related to basic understanding of hypertension pathophysiology and self care management  Nurse Case Manager Clinical Goal(s):  Marland Kitchen Over the next 30 days, patient will verbalize understanding of plan for hypertension management . Over the next 90 days, patient will not experience hospital admission.    Interventions:  . Evaluation of current treatment plan related to hypertension self management and patient's adherence to plan as established by provider. . Will send education to patient re: stroke prevention, s/s of heart attack and stroke, DASH diet, complications of uncontrolled blood pressure, calendar . Reviewed medications with patient and discussed importance of compliance . Discussed plans with patient for ongoing care management follow up and provided patient with direct contact information for care management team . Advised patient, providing education and rationale, to monitor blood pressure daily and record, calling PCP for findings outside established parameters.  . 05/09/20 . Patient states that she is doing fine her BP has been running 130/80, 120/72 . She received the information that I sent her  . She is monitoring her diet . She is sleeping well . She went to her appointment for her "stomach" and they she was doing good.  Patient Self Care Activities:  . Self administers medications as prescribed . Attends all scheduled provider appointments . Calls provider office for new concerns, questions, or BP outside discussed parameters  Initial goal documentation         The patient verbalized understanding of instructions provided today and declined a print copy of patient  instruction materials.   The care management team will reach out to the patient again over the next 21 days.   [name] RN, BSN, [facility] Care Management Coordinator [facility] Phone: [phone] Fax: [phone]

## 2020-05-09 NOTE — Chronic Care Management (AMB) (Signed)
  Care Management   Follow Up Note   05/09/2020 Name: Tamara Church MRN: [id number] DOB: 03-Sep-1976  Referred by: Fayette Pho, MD Reason for referral : Chronic Care Management (RNCM HTN)   Tamara Church is a 44 y.o. year old female who is a primary care patient of Fayette Pho, MD. The care management team was consulted for assistance with care management and care coordination needs.    Review of patient status, including review of consultants reports, relevant laboratory and other test results, and collaboration with appropriate care team members and the patient's provider was performed as part of comprehensive patient evaluation and provision of chronic care management services.    SDOH (Social Determinants of Health) assessments performed: No See Care Plan activities for detailed interventions related to [facility])     Advanced Directives: See Care Plan and Vynca application for related entries.   Goals Addressed              This Visit's Progress   .  They changed my medications and it triggered a change in my blood pressure (pt-stated)        CARE PLAN ENTRY (see longitudinal plan of care for additional care plan information)  Current Barriers:  Marland Kitchen Knowledge Deficits related to basic understanding of hypertension pathophysiology and self care management  Nurse Case Manager Clinical Goal(s):  Marland Kitchen Over the next 30 days, patient will verbalize understanding of plan for hypertension management . Over the next 90 days, patient will not experience hospital admission.    Interventions:  . Evaluation of current treatment plan related to hypertension self management and patient's adherence to plan as established by provider. . Will send education to patient re: stroke prevention, s/s of heart attack and stroke, DASH diet, complications of uncontrolled blood pressure, calendar . Reviewed medications with patient and discussed importance of compliance . Discussed plans with  patient for ongoing care management follow up and provided patient with direct contact information for care management team . Advised patient, providing education and rationale, to monitor blood pressure daily and record, calling PCP for findings outside established parameters.  . 05/09/20 . Patient states that she is doing fine her BP has been running 130/80, 120/72 . She received the information that I sent her  . She is monitoring her diet . She is sleeping well . She went to her appointment for her "stomach" and they she was doing good.  Patient Self Care Activities:  . Self administers medications as prescribed . Attends all scheduled provider appointments . Calls provider office for new concerns, questions, or BP outside discussed parameters  Initial goal documentation          The care management team will reach out to the patient again over the next 21 days.   [name] RN, BSN, [facility] Care Management Coordinator [facility] Phone: [phone] Fax: [phone]

## 2020-05-10 ENCOUNTER — Emergency Department (HOSPITAL_COMMUNITY): Payer: Medicaid Other

## 2020-05-10 ENCOUNTER — Telehealth: Payer: Self-pay

## 2020-05-10 ENCOUNTER — Encounter (HOSPITAL_COMMUNITY): Payer: Self-pay | Admitting: Pediatrics

## 2020-05-10 ENCOUNTER — Emergency Department (HOSPITAL_COMMUNITY)
Admission: EM | Admit: 2020-05-10 | Discharge: 2020-05-10 | Disposition: A | Discharge status: Home / Self Care | Payer: Medicaid Other | Attending: Emergency Medicine | Admitting: Emergency Medicine

## 2020-05-10 ENCOUNTER — Other Ambulatory Visit: Payer: Self-pay

## 2020-05-10 DIAGNOSIS — R079 Chest pain, unspecified: Secondary | ICD-10-CM | POA: Diagnosis not present

## 2020-05-10 DIAGNOSIS — B9689 Other specified bacterial agents as the cause of diseases classified elsewhere: Secondary | ICD-10-CM

## 2020-05-10 DIAGNOSIS — R457 State of emotional shock and stress, unspecified: Secondary | ICD-10-CM | POA: Diagnosis not present

## 2020-05-10 DIAGNOSIS — Z5321 Procedure and treatment not carried out due to patient leaving prior to being seen by health care provider: Secondary | ICD-10-CM | POA: Diagnosis not present

## 2020-05-10 DIAGNOSIS — R0602 Shortness of breath: Secondary | ICD-10-CM | POA: Diagnosis not present

## 2020-05-10 DIAGNOSIS — K219 Gastro-esophageal reflux disease without esophagitis: Secondary | ICD-10-CM | POA: Diagnosis not present

## 2020-05-10 DIAGNOSIS — R000 Tachycardia, unspecified: Secondary | ICD-10-CM | POA: Diagnosis not present

## 2020-05-10 DIAGNOSIS — R0789 Other chest pain: Secondary | ICD-10-CM | POA: Diagnosis not present

## 2020-05-10 LAB — BASIC METABOLIC PANEL
Anion gap: 15 (ref 5–15)
BUN: 7 mg/dL (ref 6–20)
CO2: 23 mmol/L (ref 22–32)
Calcium: 10.1 mg/dL (ref 8.9–10.3)
Chloride: 99 mmol/L (ref 98–111)
Creatinine, Ser: 0.97 mg/dL (ref 0.44–1.00)
GFR calc Af Amer: >60 mL/min (ref >60)
GFR calc non Af Amer: >60 mL/min (ref >60)
Glucose, Bld: 127 mg/dL — ABNORMAL HIGH (ref 70–99)
Potassium: 3.1 mmol/L — ABNORMAL LOW (ref 3.5–5.1)
Sodium: 137 mmol/L (ref 135–145)

## 2020-05-10 LAB — CBC
HCT: 40.7 % (ref 36.0–46.0)
Hemoglobin: 13.2 g/dL (ref 12.0–15.0)
MCH: 28.9 pg (ref 26.0–34.0)
MCHC: 32.4 g/dL (ref 30.0–36.0)
MCV: 89.1 fL (ref 80.0–100.0)
Platelets: 277 10*3/uL (ref 150–400)
RBC: 4.57 MIL/uL (ref 3.87–5.11)
RDW: 14.1 % (ref 11.5–15.5)
WBC: 7 10*3/uL (ref 4.0–10.5)
nRBC: 0 % (ref 0.0–0.2)

## 2020-05-10 LAB — TROPONIN I (HIGH SENSITIVITY): Troponin I (High Sensitivity): 3 ng/L (ref <18)

## 2020-05-10 LAB — I-STAT BETA HCG BLOOD, ED (MC, WL, AP ONLY): I-stat hCG, quantitative: <5 m[IU]/mL (ref <5)

## 2020-05-10 MED ORDER — METRONIDAZOLE 0.75 % VA GEL: 1.0000 | Freq: Every day | VAGINAL | 0 refills | Status: AC

## 2020-05-10 NOTE — Telephone Encounter (Addendum)
Vaginal metronidazole sent to pharmacy for a total of 5 days. Attempted to call pt. No answer and voicemail box not set up. Please let the patient know this was sent to her pharmacy. Thank you for you help.   Addeline Calarco Autry-Lott, DO 05/10/2020, 12:07 PM PGY-2, [facility]

## 2020-05-10 NOTE — ED Notes (Signed)
Called to recheck vitals. No answer 

## 2020-05-10 NOTE — Telephone Encounter (Deleted)
Attempted to call pt. No answer and voicemail box not set up.   Ascher Schroepfer Autry-Lott, DO 05/10/2020, 12:09 PM PGY-2, [facility]

## 2020-05-10 NOTE — ED Triage Notes (Signed)
Arrived via EMS; c/o chest pain and shortness of breath this AM while watching TV. EMS gave NTG x 1 and ASA 324 mg.

## 2020-05-10 NOTE — ED Notes (Signed)
Called pt name x3 to recheck VS. No response from pt.

## 2020-05-10 NOTE — Telephone Encounter (Signed)
Patient calls nurse line reporting undesired side effects to Flagyl. Patient reports she took two doses and woke up with a headache and upset stomach. Patient reports she does not recall having this medication in the past. Patient advised to stop taking and I will see if we can switch her to something else. Patient tested positive for BV on 8/18. Will forward to provider who saw patient.

## 2020-05-11 DIAGNOSIS — K219 Gastro-esophageal reflux disease without esophagitis: Secondary | ICD-10-CM | POA: Diagnosis not present

## 2020-05-12 ENCOUNTER — Encounter: Payer: Self-pay | Admitting: Gastroenterology

## 2020-05-13 ENCOUNTER — Telehealth: Payer: Self-pay | Admitting: *Deleted

## 2020-05-13 ENCOUNTER — Other Ambulatory Visit: Payer: Self-pay | Admitting: *Deleted

## 2020-05-13 ENCOUNTER — Telehealth: Payer: Self-pay | Admitting: Gastroenterology

## 2020-05-13 NOTE — Telephone Encounter (Signed)
Spoke with patient, read letter that was sent out yesterday regarding pathology results. Pt reports that she has lost about 10 lbs or more over the last month and a half. Pt states that she has not been able to eat much, pt states that she had to go to the ER over the weekend due to reflux being really bad. Pt reports a lack of appetite, she denies nausea, vomiting, diarrhea, or abdominal pain. Pt advised small frequent meals and bland diet may be helpful. She states that she has been trying to eat some grits and brown rice here and there, able to eat a little at times. Pt scheduled for follow up on 8/269/21 at 11:30 am, any further recommendations until appt? Please advise, thank you

## 2020-05-13 NOTE — Telephone Encounter (Signed)
Patient seen in ED on 05/10/20. Patient followed by [facility]. Will route for completion of transition of care assessment.  Burnard Bunting, RN, BSN, CCRN [facility] Engagement [facility] [phone]

## 2020-05-14 ENCOUNTER — Ambulatory Visit: Payer: Medicaid Other

## 2020-05-14 MED ORDER — AMLODIPINE BESYLATE 10 MG PO TABS
10.0000 mg | ORAL_TABLET | Freq: Every day | ORAL | 3 refills | Status: DC
Start: 2020-05-14 — End: 2021-04-29

## 2020-05-14 NOTE — Telephone Encounter (Signed)
In the interim, the patient may increase her Nexium to twice daily. Please let her know that we will likely initiate Carafate at some point in the near future. We will likely recommend a CT abdomen/pelvis with IV and oral contrast.  It is unlikely to be done before clinic visit so I can certainly talk with her about it or we can move forward with getting it scheduled. I also recommend a solid food gastric emptying study to be performed as well. I do not think that any of these imaging studies will be completed before her clinic visit but we can certainly go ahead and move forward with getting him scheduled. Happy to see her to see if there is something else that we can do but we will need this imaging study if things are really progressively having that much weight loss. We will see what her weight looks like at clinic visit. Thanks. GM

## 2020-05-14 NOTE — Telephone Encounter (Signed)
Spoke with patient and advised to increase Nexium to twice a day and that he will possibly initiate Carafate in the near future. Pt advised that Dr. Meridee Score would like to have some imaging studies done but not likely to be done before upcoming appt this week pt advised that this will be discussed more at her upcoming appt as well as check weight.

## 2020-05-15 NOTE — Chronic Care Management (AMB) (Deleted)
  Care Management   Follow Up Note   05/15/2020 Name: Tamara Church MRN: [id number] DOB: 01-Aug-1976  Referred by: Fayette Pho, MD Reason for referral : Appointment ( Check Up)   Tamara Church is a 44 y.o. year old female who is a primary care patient of Fayette Pho, MD. The care management team was consulted for assistance with care management and care coordination needs.    RNCM reached out to the patient to follow up on Ed visit on 05/10/30. Patient stated that she woke up the morning of 05-10-20 and she was short of breath and and her heart was racing.  The patient states that EMS was called.  She was evaluated and she states that they found that she was ok.  She was not seen in the ED The patient stated that she is felling better but has a problem with reflux and she was started and a  Stat started on Review of patient status, including review of consultants reports, relevant laboratory and other test results, and collaboration with appropriate care team members and the patient's provider was performed as part of comprehensive patient evaluation and provision of chronic care management services.    SDOH (Social Determinants of Health) assessments performed: No See Care Plan activities for detailed interventions related to [facility])     Advanced Directives: See Care Plan and Vynca application for related entries.    {CM FOLLOW UP PLAN:22241}  SIGNATURE

## 2020-05-16 ENCOUNTER — Encounter: Payer: Self-pay | Admitting: Gastroenterology

## 2020-05-16 ENCOUNTER — Other Ambulatory Visit (INDEPENDENT_AMBULATORY_CARE_PROVIDER_SITE_OTHER): Payer: Medicaid Other

## 2020-05-16 ENCOUNTER — Ambulatory Visit (INDEPENDENT_AMBULATORY_CARE_PROVIDER_SITE_OTHER): Payer: Medicaid Other | Admitting: Gastroenterology

## 2020-05-16 VITALS — BP 116/88 | HR 68 | Ht 62.0 in | Wt 189.0 lb

## 2020-05-16 DIAGNOSIS — K5900 Constipation, unspecified: Secondary | ICD-10-CM | POA: Diagnosis not present

## 2020-05-16 DIAGNOSIS — R6881 Early satiety: Secondary | ICD-10-CM | POA: Diagnosis not present

## 2020-05-16 DIAGNOSIS — R634 Abnormal weight loss: Secondary | ICD-10-CM

## 2020-05-16 DIAGNOSIS — R112 Nausea with vomiting, unspecified: Secondary | ICD-10-CM

## 2020-05-16 DIAGNOSIS — K2100 Gastro-esophageal reflux disease with esophagitis, without bleeding: Secondary | ICD-10-CM | POA: Diagnosis not present

## 2020-05-16 DIAGNOSIS — K219 Gastro-esophageal reflux disease without esophagitis: Secondary | ICD-10-CM

## 2020-05-16 LAB — HEPATIC FUNCTION PANEL
ALT: 15 U/L (ref 0–35)
AST: 14 U/L (ref 0–37)
Albumin: 4.4 g/dL (ref 3.5–5.2)
Alkaline Phosphatase: 47 U/L (ref 39–117)
Bilirubin, Direct: 0.2 mg/dL (ref 0.0–0.3)
Total Bilirubin: 0.6 mg/dL (ref 0.2–1.2)
Total Protein: 8.6 g/dL — ABNORMAL HIGH (ref 6.0–8.3)

## 2020-05-16 LAB — IGA: IgA: 1624 mg/dL — ABNORMAL HIGH (ref 68–378)

## 2020-05-16 LAB — CORTISOL: Cortisol, Plasma: 6.9 ug/dL

## 2020-05-16 LAB — LIPASE: Lipase: 20 U/L (ref 11.0–59.0)

## 2020-05-16 LAB — TSH: TSH: 3.08 u[IU]/mL (ref 0.35–4.50)

## 2020-05-16 LAB — HEMOGLOBIN A1C: Hgb A1c MFr Bld: 5.7 % (ref 4.6–6.5)

## 2020-05-16 NOTE — Progress Notes (Signed)
GASTROENTEROLOGY OUTPATIENT CLINIC VISIT   Primary Care Provider Fayette Pho, MD [address] [place] [address] [phone]  Patient Profile: Tamara Church is a 44 y.o. female with a pmh significant for hypertension, anemia, GERD (microscopic esophagitis noted on biopsies).  The patient presents to the [facility] for an evaluation and management of problem(s) noted below:  Problem List 1. Gastroesophageal reflux disease with esophagitis without hemorrhage   2. Unintentional weight loss   3. Early satiety   4. Non-intractable vomiting with nausea, unspecified vomiting type   5. Constipation, unspecified constipation type     History of Present Illness Please see initial consultation note by NP [facility] for full details of HPI.  Interval History The patient returns for an unscheduled visit.  She had initially seen NP Kennedy-Smith who arranged an EGD.  Endoscopically mostly was unremarkable although evidence of microscopic esophagitis was noted on biopsies.  The patient was asked to maintain her PPI therapy and appropriate dosing time at least 30 minutes before breakfast.  Subsequently we increased her PPI up to twice daily.  She has called in with concerning symptoms of progressive early satiety as well as continued unintentional weight loss and came in today for further evaluation.  She continues to experience issues of nausea with infrequent vomiting.  She denies any significant abdominal pain.  No fevers or chills.  She is not taking significant nonsteroidals or BC/Goody powders.  She continues to deal with issues of constipation.  No blood in her stools been noted.  She believes things worsened after recently being given antibiotics.  After being on twice daily PPI, she is beginning to experience improvement in some of her symptoms.  Her GERD symptoms are slowly improving.  Most concerning for her is the significant mount of weight loss that she  has had over the course the last few months. GI Review of Systems Positive as above Negative for odynophagia, dysphagia, pain, melena, hematochezia  Review of Systems General: Denies fevers/chills HEENT: Denies oral lesions Cardiovascular: Denies chest pain/palpitations Pulmonary: Denies shortness of breath Gastroenterological: See HPI Genitourinary: Denies darkened urine Hematological: Denies easy bruising/bleeding Endocrine: Denies temperature intolerance Dermatological: Denies jaundice Psychological: Mood is anxious to get better   Medications Current Outpatient Medications  Medication Sig Dispense Refill  . amLODipine (NORVASC) 10 MG tablet Take 1 tablet (10 mg total) by mouth at bedtime. 90 tablet 3  . esomeprazole (NEXIUM) 40 MG capsule Take 1 capsule (40 mg total) by mouth daily at 12 noon. 30 capsule 0  . levonorgestrel (MIRENA) 20 MCG/24HR IUD 1 each by Intrauterine route once.    . promethazine-dextromethorphan (PROMETHAZINE-DM) 6.25-15 MG/5ML syrup Take 5 mLs by mouth 4 (four) times daily as needed for cough. 118 mL 0   No current facility-administered medications for this visit.    Allergies Allergies  Allergen Reactions  . Ace Inhibitors Cough  . Angiotensin Receptor Blockers     cough  . Hctz [Hydrochlorothiazide]     hypokalemia    Histories Past Medical History:  Diagnosis Date  . Anemia   . GERD (gastroesophageal reflux disease)   . Hypertension    Past Surgical History:  Procedure Laterality Date  . NO PAST SURGERIES     Social History   Socioeconomic History  . Marital status: Single    Spouse name: Not on file  . Number of children: Not on file  . Years of education: Not on file  . Highest education level: Not on file  Occupational History  . Not on file  Tobacco Use  . Smoking status: Never Smoker  . Smokeless tobacco: Never Used  Vaping Use  . Vaping Use: Never used  Substance and Sexual Activity  . Alcohol use: No  . Drug use:  No  . Sexual activity: Yes    Birth control/protection: None, I.U.D.    Comment: pt would like to start depo provera today  Other Topics Concern  . Not on file  Social History Narrative  . Not on file   Social Determinants of Health   Financial Resource Strain:   . Difficulty of Paying Living Expenses: Not on file  Food Insecurity:   . Worried About Programme researcher, broadcasting/film/video in the Last Year: Not on file  . Ran Out of Food in the Last Year: Not on file  Transportation Needs:   . Lack of Transportation (Medical): Not on file  . Lack of Transportation (Non-Medical): Not on file  Physical Activity:   . Days of Exercise per Week: Not on file  . Minutes of Exercise per Session: Not on file  Stress:   . Feeling of Stress : Not on file  Social Connections:   . Frequency of Communication with Friends and Family: Not on file  . Frequency of Social Gatherings with Friends and Family: Not on file  . Attends Religious Services: Not on file  . Active Member of Clubs or Organizations: Not on file  . Attends Banker Meetings: Not on file  . Marital Status: Not on file  Intimate Partner Violence:   . Fear of Current or Ex-Partner: Not on file  . Emotionally Abused: Not on file  . Physically Abused: Not on file  . Sexually Abused: Not on file   Family History  Problem Relation Age of Onset  . Hypertension Mother   . Colon cancer Neg Hx   . Esophageal cancer Neg Hx   . Rectal cancer Neg Hx   . Stomach cancer Neg Hx   . Inflammatory bowel disease Neg Hx   . Liver disease Neg Hx   . Pancreatic cancer Neg Hx    I have reviewed her medical, social, and family history in detail and updated the electronic medical record as necessary.    PHYSICAL EXAMINATION  BP 116/88   Pulse 68   Ht 5\' 2"  (1.575 m)   Wt 189 lb (85.7 kg)   LMP 04/28/2020   BMI 34.57 kg/m  Wt Readings from Last 3 Encounters:  05/16/20 189 lb (85.7 kg)  05/08/20 193 lb 6.4 oz (87.7 kg)  05/01/20 195 lb  (88.5 kg)  GEN: NAD, appears stated age, doesn't appear chronically ill PSYCH: Cooperative, without pressured speech EYE: Conjunctivae pink, sclerae anicteric ENT: MMM CV: Nontachycardic RESP: No audible wheezing GI: NABS, soft, rounded, protuberant, nontender, without rebound or guarding MSK/EXT: No significant lower extremity edema SKIN: No jaundice NEURO:  Alert & Oriented x 3, no focal deficits   REVIEW OF DATA  I reviewed the following data at the time of this encounter:  GI Procedures and Studies  8/21 EGD - No gross lesions in esophagus. Biopsied for EoE/LoE. - Z-line irregular, 36 cm from the incisors. - Erythematous mucosa in the gastric body. No other gross lesions in the stomach. Biopsied. - No gross lesions in the duodenal bulb, in the first portion of the duodenum and in the second portion of the duodenum. Biopsied.  Pathology Diagnosis 1. Surgical [P], duodenum - DUODENAL MUCOSA WITH  NO SPECIFIC HISTOPATHOLOGIC CHANGES - NEGATIVE FOR INCREASED INTRAEPITHELIAL LYMPHOCYTES OR VILLOUS ARCHITECTURAL CHANGES 2. Surgical [P], random gastric sites - GASTRIC ANTRAL MUCOSA WITH MILD NONSPECIFIC REACTIVE GASTROPATHY - GASTRIC OXYNTIC MUCOSA WITH NO SPECIFIC HISTOPATHOLOGIC CHANGES - WARTHIN STARRY STAIN IS NEGATIVE FOR HELICOBACTER PYLORI 3. Surgical [P], random esophageal sites - ESOPHAGEAL SQUAMOUS MUCOSA WITH MILD VASCULAR CONGESTION, AND FOCAL SQUAMOUS BALLOONING, SUGGESTIVE OF MILD REFLUX ESOPHAGITIS - NEGATIVE FOR INCREASED INTRAEPITHELIAL EOSINOPHILS  Laboratory Studies  Reviewed those in EPIC  Imaging Studies  RUQUS IMPRESSION: 1. Mild cholelithiasis. No other sonographic features to suggest acute cholecystitis. 2. No biliary dilatation. 3. Normal sonographic evaluation of the liver. No other acute abnormality identified.   ASSESSMENT  Ms. Schall is a 44 y.o. female with a pmh significant for hypertension, anemia, GERD (microscopic esophagitis noted on  biopsies).  The patient is seen today for evaluation and management of:  1. Gastroesophageal reflux disease with esophagitis without hemorrhage   2. Unintentional weight loss   3. Early satiety   4. Non-intractable vomiting with nausea, unspecified vomiting type   5. Constipation, unspecified constipation type    The patient is hemodynamically stable.  Clinically, she seems to be doing slightly better today than she has been over the course the last few weeks.  She continues to have unintentional weight loss.  GERD seems to be under better control now a few days into twice daily PPI.  Due to the progressive symptoms even though today she is doing better I do think cross-sectional imaging is the next step in her evaluation.  We will proceed with a CT abdomen pelvis with IV and oral contrast.  I will obtain some other laboratories to further evaluate symptoms and etiologies for potential unintentional weight loss was nausea.  If symptoms persist and cross-sectional imaging is negative then I would expect next up to for her to be a solid-phase gastric emptying study   PLAN  Continue twice daily PPI Laboratories as outlined below CT abdomen pelvis with IV and oral contrast If patient's symptoms persist and weight loss persists continues then would recommend a solid food gastric emptying study   Orders Placed This Encounter  Procedures  . CT Abdomen Pelvis W Contrast  . Lipase  . Hepatic function panel  . TSH  . Cortisol  . HgB A1c  . IgA  . Tissue transglutaminase, IgA    New Prescriptions   No medications on file   Modified Medications   No medications on file    Planned Follow Up No follow-ups on file.   Total Time in Face-to-Face and in Coordination of Care for patient including independent/personal interpretation/review of prior testing, medical history, examination, medication adjustment, communicating results with the patient directly, and documentation with the EHR is 30  minutes.   [name], MD [facility] Office # [phone]

## 2020-05-16 NOTE — Patient Instructions (Signed)
If you are age 43 or older, your body mass index should be between 23-30. Your Body mass index is 34.57 kg/m. If this is out of the aforementioned range listed, please consider follow up with your Primary Care Provider.  If you are age 43 or younger, your body mass index should be between 19-25. Your Body mass index is 34.57 kg/m. If this is out of the aformentioned range listed, please consider follow up with your Primary Care Provider.  . You have been scheduled for a CT scan of the abdomen and pelvis at [facility], 1st floor Radiology. You are scheduled on 05/23/20 at 4:30pm. You should arrive 15 minutes prior to your appointment time for registration. Please follow the written instructions below on the day of your exam:    1) Do not eat anything after 12:30pm (4 hours prior to your test)  2) You have been given  2 bottles of oral contrast to drink.  The solution may taste better if refrigerated, but do NOT add ice or any other liquid to this solution. Shake well before drinking.   Drink 1 bottle of contrast @ 2:30pm (2 hours prior to your exam)  Drink 1 bottle of contrast @ 3:30pm (1 hour prior to your exam)   You may take any medications as prescribed with a small amount of water, if necessary. If you take any of the following medications: METFORMIN, GLUCOPHAGE, GLUCOVANCE, AVANDAMET, RIOMET, FORTAMET, [place] MET, JANUMET, GLUMETZA or METAGLIP, you MAY be asked to HOLD this medication 48 hours AFTER the exam.   The purpose of you drinking the oral contrast is to aid in the visualization of your intestinal tract. The contrast solution may cause some diarrhea. Depending on your individual set of symptoms, you may also receive an intravenous injection of x-ray contrast/dye. Plan on being at [facility] for 45 minutes or longer, depending on the type of exam you are having performed.   If you have any questions regarding your exam or if you need to reschedule, you may call [facility]  at [phone] between the hours of 8:00 am and 5:00 pm, Monday-Friday.   Your provider has requested that you go to the basement level for lab work before leaving today. Press "B" on the elevator. The lab is located at the first door on the left as you exit the elevator.   We will call you with follow-up  appointment for 4-6 weeks. If your CT scan is normal we will schedule you for a Gastric Emptying Study prior to your appointment.   Thank you for choosing me and [facility].  Dr. Rush Landmark

## 2020-05-17 LAB — TISSUE TRANSGLUTAMINASE, IGA: (tTG) Ab, IgA: 1 U/mL

## 2020-05-17 NOTE — Patient Instructions (Signed)
Visit Information  Goals Addressed              This Visit's Progress   .  I woke up short of breath (pt-stated)        CARE PLAN ENTRY (see longitudinal plan of care for additional care plan information)  Current Barriers:  Marland Kitchen Knowledge Deficits related to RD event on 8/ 20/21-RNCM reached out to the patient to follow up on Ed visit on 05/10/30. Patient stated that she woke up the morning of 05-10-20 and she was short of breath and and her heart was racing.  The patient states that EMS was called.  She was evaluated and she states that they found that she was ok.  She was not seen in the ED  Nurse Case Manager Clinical Goal(s):  Marland Kitchen Over the next 10 days, patient will verbalize understanding of plan for GERD  Interventions:  . Inter-disciplinary care team collaboration (see longitudinal plan of care) . Discussed plans with patient for ongoing care management follow up and provided patient with direct contact information for care management team . Assessed the patient and she stated that she is felling better but has a problem with reflux and she was started on Nexium.  She has been in contact with her gastroenterologist and they are planning on doing test. Advised the patient about diet and drinks that can affect her reflux.  Things that she needs to stay away from.  Patient Self Care Activities:  . Patient verbalizes understanding of plan  . Attends all scheduled provider appointments . Performs ADL's independently . Performs IADL's independently . Calls provider office for new concerns or questions Initial goal documentation        Ms. Strand was given information about Care Management services today including:  1. Care Management services include personalized support from designated clinical staff supervised by her physician, including individualized plan of care and coordination with other care providers 2. 24/7 contact phone numbers for assistance for urgent and routine care  needs. 3. The patient may stop CCM services at any time (effective at the end of the month) by phone call to the office staff.  Patient agreed to services and verbal consent obtained.   The patient verbalized understanding of instructions provided today and declined a print copy of patient instruction materials.   The care management team will reach out to the patient again over the next 14 days.   [name] RN, BSN, [facility] Care Management Coordinator [facility] Phone: [phone] Fax: [phone]

## 2020-05-17 NOTE — Chronic Care Management (AMB) (Signed)
  Care Management   Follow Up Note   05/17/2020 Name: Tamara Church MRN: [id number] DOB: 31-Mar-1976  Referred by: Fayette Pho, MD Reason for referral : Appointment ( Check Up)   Tamara Church is a 44 y.o. year old female who is a primary care patient of Fayette Pho, MD. The care management team was consulted for assistance with care management and care coordination needs.    Review of patient status, including review of consultants reports, relevant laboratory and other test results, and collaboration with appropriate care team members and the patient's provider was performed as part of comprehensive patient evaluation and provision of chronic care management services.    SDOH (Social Determinants of Health) assessments performed: No See Care Plan activities for detailed interventions related to [facility])     Advanced Directives: See Care Plan and Vynca application for related entries.   Goals Addressed              This Visit's Progress   .  I woke up short of breath (pt-stated)        CARE PLAN ENTRY (see longitudinal plan of care for additional care plan information)  Current Barriers:  Marland Kitchen Knowledge Deficits related to RD event on 8/ 20/21-RNCM reached out to the patient to follow up on Ed visit on 05/10/30. Patient stated that she woke up the morning of 05-10-20 and she was short of breath and and her heart was racing.  The patient states that EMS was called.  She was evaluated and she states that they found that she was ok.  She was not seen in the ED  Nurse Case Manager Clinical Goal(s):  Marland Kitchen Over the next 10 days, patient will verbalize understanding of plan for GERD  Interventions:  . Inter-disciplinary care team collaboration (see longitudinal plan of care) . Discussed plans with patient for ongoing care management follow up and provided patient with direct contact information for care management team . Assessed the patient and she stated that she is felling better  but has a problem with reflux and she was started on Nexium.  She has been in contact with her gastroenterologist and they are planning on doing test. Advised the patient about diet and drinks that can affect her reflux.  Things that she needs to stay away from.  Patient Self Care Activities:  . Patient verbalizes understanding of plan  . Attends all scheduled provider appointments . Performs ADL's independently . Performs IADL's independently . Calls provider office for new concerns or questions Initial goal documentation         The care management team will reach out to the patient again over the next 14 days.   [name] RN, BSN, [facility] Care Management Coordinator [facility] Phone: [phone] Fax: [phone]

## 2020-05-18 ENCOUNTER — Encounter: Payer: Self-pay | Admitting: Gastroenterology

## 2020-05-18 DIAGNOSIS — R6881 Early satiety: Secondary | ICD-10-CM

## 2020-05-18 DIAGNOSIS — R1110 Vomiting, unspecified: Secondary | ICD-10-CM | POA: Insufficient documentation

## 2020-05-18 DIAGNOSIS — R634 Abnormal weight loss: Secondary | ICD-10-CM | POA: Insufficient documentation

## 2020-05-18 DIAGNOSIS — K5900 Constipation, unspecified: Secondary | ICD-10-CM

## 2020-05-18 HISTORY — DX: Constipation, unspecified: K59.00

## 2020-05-18 HISTORY — DX: Early satiety: R68.81

## 2020-05-18 HISTORY — DX: Abnormal weight loss: R63.4

## 2020-05-18 HISTORY — DX: Vomiting, unspecified: R11.10

## 2020-05-21 ENCOUNTER — Telehealth: Payer: Self-pay | Admitting: Gastroenterology

## 2020-05-21 NOTE — Telephone Encounter (Signed)
Returned pt's call. She had questions regarding contrast for CT scan. Went over instructions with pt again and advised when to stop eating and when to drink contrast. Pt voiced understanding.

## 2020-05-23 ENCOUNTER — Other Ambulatory Visit: Payer: Self-pay

## 2020-05-23 ENCOUNTER — Encounter (HOSPITAL_COMMUNITY): Payer: Self-pay

## 2020-05-23 ENCOUNTER — Ambulatory Visit (HOSPITAL_COMMUNITY)
Admission: RE | Admit: 2020-05-23 | Discharge: 2020-05-23 | Disposition: A | Discharge status: Home / Self Care | Payer: Medicaid Other | Source: Ambulatory Visit | Attending: Gastroenterology | Admitting: Gastroenterology

## 2020-05-23 DIAGNOSIS — R6881 Early satiety: Secondary | ICD-10-CM | POA: Diagnosis not present

## 2020-05-23 DIAGNOSIS — K8020 Calculus of gallbladder without cholecystitis without obstruction: Secondary | ICD-10-CM | POA: Diagnosis not present

## 2020-05-23 DIAGNOSIS — R634 Abnormal weight loss: Secondary | ICD-10-CM | POA: Insufficient documentation

## 2020-05-23 DIAGNOSIS — K2100 Gastro-esophageal reflux disease with esophagitis, without bleeding: Secondary | ICD-10-CM | POA: Insufficient documentation

## 2020-05-23 DIAGNOSIS — N200 Calculus of kidney: Secondary | ICD-10-CM | POA: Diagnosis not present

## 2020-05-23 DIAGNOSIS — R112 Nausea with vomiting, unspecified: Secondary | ICD-10-CM | POA: Insufficient documentation

## 2020-05-23 DIAGNOSIS — K5900 Constipation, unspecified: Secondary | ICD-10-CM | POA: Diagnosis not present

## 2020-05-23 DIAGNOSIS — K449 Diaphragmatic hernia without obstruction or gangrene: Secondary | ICD-10-CM | POA: Insufficient documentation

## 2020-05-23 DIAGNOSIS — N854 Malposition of uterus: Secondary | ICD-10-CM | POA: Insufficient documentation

## 2020-05-23 MED ORDER — IOHEXOL 300 MG/ML  SOLN
100.0000 mL | Freq: Once | INTRAMUSCULAR | Status: AC | PRN
  Administered 2020-05-23: 100 mL via INTRAVENOUS

## 2020-05-26 ENCOUNTER — Encounter (HOSPITAL_BASED_OUTPATIENT_CLINIC_OR_DEPARTMENT_OTHER): Payer: Self-pay | Admitting: *Deleted

## 2020-05-26 ENCOUNTER — Emergency Department (HOSPITAL_BASED_OUTPATIENT_CLINIC_OR_DEPARTMENT_OTHER): Payer: Medicaid Other

## 2020-05-26 ENCOUNTER — Emergency Department (HOSPITAL_BASED_OUTPATIENT_CLINIC_OR_DEPARTMENT_OTHER)
Admission: EM | Admit: 2020-05-26 | Discharge: 2020-05-26 | Disposition: A | Discharge status: Home / Self Care | Payer: Medicaid Other | Attending: Emergency Medicine | Admitting: Emergency Medicine

## 2020-05-26 ENCOUNTER — Other Ambulatory Visit: Payer: Self-pay

## 2020-05-26 DIAGNOSIS — I129 Hypertensive chronic kidney disease with stage 1 through stage 4 chronic kidney disease, or unspecified chronic kidney disease: Secondary | ICD-10-CM | POA: Diagnosis not present

## 2020-05-26 DIAGNOSIS — K219 Gastro-esophageal reflux disease without esophagitis: Secondary | ICD-10-CM | POA: Diagnosis not present

## 2020-05-26 DIAGNOSIS — R1110 Vomiting, unspecified: Secondary | ICD-10-CM | POA: Diagnosis not present

## 2020-05-26 DIAGNOSIS — R112 Nausea with vomiting, unspecified: Secondary | ICD-10-CM | POA: Diagnosis not present

## 2020-05-26 DIAGNOSIS — R0789 Other chest pain: Secondary | ICD-10-CM | POA: Insufficient documentation

## 2020-05-26 DIAGNOSIS — Z79899 Other long term (current) drug therapy: Secondary | ICD-10-CM | POA: Diagnosis not present

## 2020-05-26 DIAGNOSIS — N182 Chronic kidney disease, stage 2 (mild): Secondary | ICD-10-CM | POA: Diagnosis not present

## 2020-05-26 DIAGNOSIS — R079 Chest pain, unspecified: Secondary | ICD-10-CM

## 2020-05-26 DIAGNOSIS — R000 Tachycardia, unspecified: Secondary | ICD-10-CM | POA: Diagnosis not present

## 2020-05-26 LAB — URINALYSIS, ROUTINE W REFLEX MICROSCOPIC
Bilirubin Urine: NEGATIVE
Glucose, UA: 250 mg/dL — AB
Ketones, ur: NEGATIVE mg/dL
Nitrite: NEGATIVE
Protein, ur: NEGATIVE mg/dL
Specific Gravity, Urine: 1.02 (ref 1.005–1.030)
pH: 7.5 (ref 5.0–8.0)

## 2020-05-26 LAB — CBC WITH DIFFERENTIAL/PLATELET
Abs Immature Granulocytes: 0.01 10*3/uL (ref 0.00–0.07)
Basophils Absolute: 0 10*3/uL (ref 0.0–0.1)
Basophils Relative: 1 %
Eosinophils Absolute: 0 10*3/uL (ref 0.0–0.5)
Eosinophils Relative: 0 %
HCT: 38.3 % (ref 36.0–46.0)
Hemoglobin: 12.5 g/dL (ref 12.0–15.0)
Immature Granulocytes: 0 %
Lymphocytes Relative: 17 %
Lymphs Abs: 0.9 10*3/uL (ref 0.7–4.0)
MCH: 28.9 pg (ref 26.0–34.0)
MCHC: 32.6 g/dL (ref 30.0–36.0)
MCV: 88.5 fL (ref 80.0–100.0)
Monocytes Absolute: 0.2 10*3/uL (ref 0.1–1.0)
Monocytes Relative: 4 %
Neutro Abs: 4.1 10*3/uL (ref 1.7–7.7)
Neutrophils Relative %: 78 %
Platelets: 263 10*3/uL (ref 150–400)
RBC: 4.33 MIL/uL (ref 3.87–5.11)
RDW: 14.1 % (ref 11.5–15.5)
WBC: 5.3 10*3/uL (ref 4.0–10.5)
nRBC: 0 % (ref 0.0–0.2)

## 2020-05-26 LAB — TROPONIN I (HIGH SENSITIVITY)
Troponin I (High Sensitivity): 3 ng/L (ref <18)
Troponin I (High Sensitivity): 3 ng/L (ref <18)

## 2020-05-26 LAB — PREGNANCY, URINE: Preg Test, Ur: NEGATIVE

## 2020-05-26 LAB — COMPREHENSIVE METABOLIC PANEL
ALT: 16 U/L (ref 0–44)
AST: 18 U/L (ref 15–41)
Albumin: 4.1 g/dL (ref 3.5–5.0)
Alkaline Phosphatase: 46 U/L (ref 38–126)
Anion gap: 12 (ref 5–15)
BUN: 11 mg/dL (ref 6–20)
CO2: 21 mmol/L — ABNORMAL LOW (ref 22–32)
Calcium: 8.7 mg/dL — ABNORMAL LOW (ref 8.9–10.3)
Chloride: 105 mmol/L (ref 98–111)
Creatinine, Ser: 0.79 mg/dL (ref 0.44–1.00)
GFR calc Af Amer: >60 mL/min (ref >60)
GFR calc non Af Amer: >60 mL/min (ref >60)
Glucose, Bld: 142 mg/dL — ABNORMAL HIGH (ref 70–99)
Potassium: 3.1 mmol/L — ABNORMAL LOW (ref 3.5–5.1)
Sodium: 138 mmol/L (ref 135–145)
Total Bilirubin: 0.5 mg/dL (ref 0.3–1.2)
Total Protein: 8 g/dL (ref 6.5–8.1)

## 2020-05-26 LAB — URINALYSIS, MICROSCOPIC (REFLEX)

## 2020-05-26 LAB — LIPASE, BLOOD: Lipase: 29 U/L (ref 11–51)

## 2020-05-26 MED ORDER — SODIUM CHLORIDE 0.9 % IV BOLUS
500.0000 mL | Freq: Once | INTRAVENOUS | Status: AC
  Administered 2020-05-26: 500 mL via INTRAVENOUS

## 2020-05-26 MED ORDER — ALUM & MAG HYDROXIDE-SIMETH 200-200-20 MG/5ML PO SUSP
30.0000 mL | Freq: Once | ORAL | Status: AC
  Administered 2020-05-26: 30 mL via ORAL
  Filled 2020-05-26: qty 30

## 2020-05-26 MED ORDER — ONDANSETRON 4 MG PO TBDP: 4.0000 mg | ORAL_TABLET | Freq: Three times a day (TID) | ORAL | 0 refills | Status: DC | PRN

## 2020-05-26 MED ORDER — ONDANSETRON HCL 4 MG/2ML IJ SOLN
INTRAMUSCULAR | Status: AC
  Administered 2020-05-26: 4 mg via INTRAVENOUS
  Filled 2020-05-26: qty 2

## 2020-05-26 MED ORDER — LIDOCAINE VISCOUS HCL 2 % MT SOLN
15.0000 mL | Freq: Once | OROMUCOSAL | Status: AC
  Administered 2020-05-26: 15 mL via ORAL
  Filled 2020-05-26: qty 15

## 2020-05-26 MED ORDER — ESOMEPRAZOLE MAGNESIUM 40 MG PO CPDR: 40.0000 mg | DELAYED_RELEASE_CAPSULE | Freq: Every day | ORAL | 0 refills | Status: DC

## 2020-05-26 NOTE — ED Provider Notes (Signed)
[facility] EMERGENCY DEPARTMENT Provider Note   CSN: [id number] Arrival date & time: 05/26/20  9528     History Chief Complaint  Patient presents with  . acid reflux    Tamara Church is a 44 y.o. female.  HPI     9PM chicken salad/noodles began to have burping, reflux symptoms at 3AM Took nexium to see if it would calm down reflux symptoms but didn't help Burning epigastrium and chest, nothing makes it better or worse has had similar symptoms with reflux. Not positional, pleuritic or exertional Nausea and one episode of vomiting in ED No shortness of breath Sometimes pain radiates to arm and feels jittery No abdominal pain, constipation, diarrhea, fevers Urinary frequency, not dysuria  No family hx of early CAD No cigarettes/etoh/drugs   Past Medical History:  Diagnosis Date  . Anemia   . GERD (gastroesophageal reflux disease)   . Hypertension     Patient Active Problem List   Diagnosis Date Noted  . Unintentional weight loss 05/18/2020  . Early satiety 05/18/2020  . Non-intractable vomiting 05/18/2020  . Constipation 05/18/2020  . Bacterial vaginosis 05/08/2020  . UTI (urinary tract infection) 04/23/2020  . Palpitations 03/22/2020  . Adjustment disorder with depressed mood 03/15/2020  . Seasonal allergies 02/08/2020  . Decreased hearing 12/15/2019  . CKD (chronic kidney disease) stage 2, GFR 60-89 ml/min 03/21/2012  . ANEMIA 09/07/2008  . GERD 09/04/2008  . OBESITY, NOS 11/18/2006  . HYPERTENSION, BENIGN SYSTEMIC 11/18/2006    Past Surgical History:  Procedure Laterality Date  . NO PAST SURGERIES       OB History    Gravida  6   Para  6   Term  6   Preterm  0   AB  0   Living  6     SAB  0   TAB  0   Ectopic  0   Multiple  0   Live Births              Family History  Problem Relation Age of Onset  . Hypertension Mother   . Colon cancer Neg Hx   . Esophageal cancer Neg Hx   . Rectal cancer Neg Hx   .  Stomach cancer Neg Hx   . Inflammatory bowel disease Neg Hx   . Liver disease Neg Hx   . Pancreatic cancer Neg Hx     Social History   Tobacco Use  . Smoking status: Never Smoker  . Smokeless tobacco: Never Used  Vaping Use  . Vaping Use: Never used  Substance Use Topics  . Alcohol use: No  . Drug use: No    Home Medications Prior to Admission medications   Medication Sig Start Date End Date Taking? Authorizing Provider  amLODipine (NORVASC) 10 MG tablet Take 1 tablet (10 mg total) by mouth at bedtime. 05/14/20   Fayette Pho, MD  esomeprazole (NEXIUM) 40 MG capsule Take 1 capsule (40 mg total) by mouth daily at 12 noon. 05/26/20   Alvira Monday, MD  levonorgestrel (MIRENA) 20 MCG/24HR IUD 1 each by Intrauterine route once.    [provider]  ondansetron (ZOFRAN ODT) 4 MG disintegrating tablet Take 1 tablet (4 mg total) by mouth every 8 (eight) hours as needed for nausea or vomiting. 05/26/20   Alvira Monday, MD    Allergies    Ace inhibitors, Angiotensin receptor blockers, and Hctz [hydrochlorothiazide]  Review of Systems   Review of Systems  Constitutional: Negative for  fever.  HENT: Negative for sore throat.   Eyes: Negative for visual disturbance.  Respiratory: Negative for cough and shortness of breath.   Cardiovascular: Positive for chest pain.  Gastrointestinal: Positive for nausea and vomiting. Negative for abdominal pain, constipation and diarrhea.  Genitourinary: Positive for frequency. Negative for difficulty urinating.  Musculoskeletal: Negative for back pain and neck pain.  Skin: Negative for rash.  Neurological: Negative for syncope and headaches.    Physical Exam Updated Vital Signs BP 140/89 (BP Location: Right Arm)   Pulse 99   Temp 98 F (36.7 C) (Oral)   Resp (!) 21   Ht 5\' 2"  (1.575 m)   Wt 85.7 kg   LMP 05/12/2020 (Approximate)   SpO2 100%   BMI 34.57 kg/m   Physical Exam Vitals and nursing note reviewed.  Constitutional:       General: She is not in acute distress.    Appearance: She is well-developed. She is not diaphoretic.  HENT:     Head: Normocephalic and atraumatic.  Eyes:     Conjunctiva/sclera: Conjunctivae normal.  Cardiovascular:     Rate and Rhythm: Normal rate and regular rhythm.     Heart sounds: Normal heart sounds. No murmur heard.  No friction rub. No gallop.   Pulmonary:     Effort: Pulmonary effort is normal. No respiratory distress.     Breath sounds: Normal breath sounds. No wheezing or rales.  Abdominal:     General: There is no distension.     Palpations: Abdomen is soft.     Tenderness: There is no abdominal tenderness. There is no guarding.  Musculoskeletal:        General: No tenderness.     Cervical back: Normal range of motion.  Skin:    General: Skin is warm and dry.     Findings: No erythema or rash.  Neurological:     Mental Status: She is alert and oriented to person, place, and time.     ED Results / Procedures / Treatments   Labs (all labs ordered are listed, but only abnormal results are displayed) Labs Reviewed  COMPREHENSIVE METABOLIC PANEL - Abnormal; Notable for the following components:      Result Value   Potassium 3.1 (*)    CO2 21 (*)    Glucose, Bld 142 (*)    Calcium 8.7 (*)    All other components within normal limits  URINALYSIS, ROUTINE W REFLEX MICROSCOPIC - Abnormal; Notable for the following components:   APPearance HAZY (*)    Glucose, UA 250 (*)    Hgb urine dipstick SMALL (*)    Leukocytes,Ua SMALL (*)    All other components within normal limits  URINALYSIS, MICROSCOPIC (REFLEX) - Abnormal; Notable for the following components:   Bacteria, UA FEW (*)    All other components within normal limits  CBC WITH DIFFERENTIAL/PLATELET  LIPASE, BLOOD  PREGNANCY, URINE  TROPONIN I (HIGH SENSITIVITY)  TROPONIN I (HIGH SENSITIVITY)    EKG EKG Interpretation  Date/Time:  Sunday May 26 2020 06:43:43 EDT Ventricular Rate:  103 PR  Interval:    QRS Duration: 96 QT Interval:  350 QTC Calculation: 459 R Axis:   5 Text Interpretation: Sinus tachycardia No significant change was found Confirmed by [id number] (Paula Libra) on 05/26/2020 6:45:50 AM Also confirmed by 07/26/2020 [phone])  on 05/26/2020 7:06:32 AM   Radiology DG Chest 2 View  Result Date: 05/26/2020 CLINICAL DATA:  Chest pain, vomiting EXAM: CHEST - 2 VIEW  COMPARISON:  05/10/2020 FINDINGS: Lungs are clear. No pleural effusion or pneumothorax. The heart is normal in size. Visualized osseous structures are within normal limits. IMPRESSION: Normal chest radiographs. Electronically Signed   By: Charline Bills M.D.   On: 05/26/2020 08:02    Procedures Procedures (including critical care time)  Medications Ordered in ED Medications  alum & mag hydroxide-simeth (MAALOX/MYLANTA) 200-200-20 MG/5ML suspension 30 mL (30 mLs Oral Given 05/26/20 0703)    And  lidocaine (XYLOCAINE) 2 % viscous mouth solution 15 mL (15 mLs Oral Given 05/26/20 0703)  ondansetron (ZOFRAN) 4 MG/2ML injection (4 mg Intravenous Given 05/26/20 0721)  sodium chloride 0.9 % bolus 500 mL ( Intravenous Stopped 05/26/20 1962)    ED Course  I have reviewed the triage vital signs and the nursing notes.  Pertinent labs & imaging results that were available during my care of the patient were reviewed by me and considered in my medical decision making (see chart for details).    MDM Rules/Calculators/A&P                          44yo female with history of hypertension, obesity presents with concern for chest pain, sensation of reflux and emesis.   Differential diagnosis for chest pain includes pulmonary embolus, dissection, pneumothorax, pneumonia, ACS, myocarditis, pericarditis, GERD.  EKG was done and evaluate by me and showed no acute ST changes and no signs of pericarditis. Chest x-ray was done and evaluated by me and radiology and showed no sign of pneumonia or pneumothorax. No dyspnea, asymmetric leg  swelling and have low suspicion for PE.   Do not feel history or exam are consistent with aortic dissection. Delta troponins which were both negative and have low suspicion for ACS. No sign of pancreatiatis or GB disease. History of reflux with similar sensation now and brought on by food last night make this most likely diagnosis. Reports taking 20mg  PPI-recommend increase to 40 for 2 weeks and follow up with PCP> Patient discharged in stable condition with understanding of reasons to return.     Final Clinical Impression(s) / ED Diagnoses Final diagnoses:  Chest pain, unspecified type  Nausea and vomiting, intractability of vomiting not specified, unspecified vomiting type  Gastroesophageal reflux disease without esophagitis    Rx / DC Orders ED Discharge Orders         Ordered    esomeprazole (NEXIUM) 40 MG capsule  Daily        05/26/20 1034    ondansetron (ZOFRAN ODT) 4 MG disintegrating tablet  Every 8 hours PRN        05/26/20 1034           07/26/20, MD 05/27/20 [phone]

## 2020-05-26 NOTE — ED Notes (Signed)
Pt ambulated to bathroom w/minimal assistance  

## 2020-05-26 NOTE — ED Notes (Signed)
Patient transported to X-ray 

## 2020-05-26 NOTE — ED Triage Notes (Signed)
Pt c/o "acid reflux" pain that started at 3am. States she took a nexium with no relief. Denies any other symptoms. States a hx of reflux. Denies sob.

## 2020-05-30 ENCOUNTER — Other Ambulatory Visit: Payer: Self-pay

## 2020-05-30 ENCOUNTER — Encounter (HOSPITAL_BASED_OUTPATIENT_CLINIC_OR_DEPARTMENT_OTHER): Payer: Self-pay | Admitting: Emergency Medicine

## 2020-05-30 ENCOUNTER — Emergency Department (HOSPITAL_BASED_OUTPATIENT_CLINIC_OR_DEPARTMENT_OTHER)
Admission: EM | Admit: 2020-05-30 | Discharge: 2020-05-30 | Disposition: A | Discharge status: Home / Self Care | Payer: Medicaid Other | Attending: Emergency Medicine | Admitting: Emergency Medicine

## 2020-05-30 ENCOUNTER — Telehealth: Payer: Self-pay | Admitting: Gastroenterology

## 2020-05-30 DIAGNOSIS — J029 Acute pharyngitis, unspecified: Secondary | ICD-10-CM | POA: Diagnosis not present

## 2020-05-30 DIAGNOSIS — R110 Nausea: Secondary | ICD-10-CM | POA: Insufficient documentation

## 2020-05-30 DIAGNOSIS — I129 Hypertensive chronic kidney disease with stage 1 through stage 4 chronic kidney disease, or unspecified chronic kidney disease: Secondary | ICD-10-CM | POA: Insufficient documentation

## 2020-05-30 DIAGNOSIS — Z79899 Other long term (current) drug therapy: Secondary | ICD-10-CM | POA: Diagnosis not present

## 2020-05-30 DIAGNOSIS — N182 Chronic kidney disease, stage 2 (mild): Secondary | ICD-10-CM | POA: Insufficient documentation

## 2020-05-30 DIAGNOSIS — I10 Essential (primary) hypertension: Secondary | ICD-10-CM | POA: Diagnosis not present

## 2020-05-30 DIAGNOSIS — R142 Eructation: Secondary | ICD-10-CM | POA: Insufficient documentation

## 2020-05-30 MED ORDER — ALUM & MAG HYDROXIDE-SIMETH 200-200-20 MG/5ML PO SUSP
30.0000 mL | Freq: Once | ORAL | Status: AC
  Administered 2020-05-30: 30 mL via ORAL
  Filled 2020-05-30: qty 30

## 2020-05-30 MED ORDER — LIDOCAINE VISCOUS HCL 2 % MT SOLN
15.0000 mL | Freq: Once | OROMUCOSAL | Status: AC
  Administered 2020-05-30: 15 mL via ORAL
  Filled 2020-05-30: qty 15

## 2020-05-30 NOTE — Telephone Encounter (Signed)
The pt has been advised and will keep appt as planned  

## 2020-05-30 NOTE — Discharge Instructions (Signed)
Try to avoid things that may make this worse, most commonly these are spicy foods tomato based products fatty foods chocolate and peppermint.  Alcohol and tobacco can also make this worse.Try not to drink or eat anything up to 4 hours prior to lying flat(including water!)  Return to the emergency department for sudden worsening pain fever or inability to eat or drink.

## 2020-05-30 NOTE — Telephone Encounter (Signed)
Mansouraty, Netty Starring., MD  Loretha Stapler, RN Claude Swendsen, Please let the patient know that I reviewed her CT scan. The liver is normal in appearance. She has a small gallstone. There is no evidence of gallbladder infection currently. Her pancreas is normal. Her spleen is normal.  The CT scan in the setting of the oral contrast does show a very small hiatal hernia. Looking at her endoscopy again I did not see this but sometimes this is only found when patients do swallowing of contrast. Not clear that this is causing her issues. The rest of the contrast makes its way throughout the bowel and shows no evidence of a bowel obstruction.  Adrenal glands show likely cysts in the kidneys as well as kidney stones that are not blocking the pathway of urine. The bladder shows some potential thickening but may be a result of not having actual urine in it.  I saw that she actually just went to the ED recently and was only taking once daily dosing of PPI so I agree with what the ED had suggested in regards to higher dose PPI therapy which is what I had thought she had been on. We were going to consider potential gastric emptying study depending on how frequently symptoms were occurring but she had been doing well at the time of her clinic visit. Please set her up for follow-up with NP [facility] or myself in the next 4 to 6 weeks. Thank you. GM

## 2020-05-30 NOTE — ED Provider Notes (Signed)
[facility] EMERGENCY DEPARTMENT Provider Note   CSN: [id number] Arrival date & time: 05/30/20  6301     History Chief Complaint  Patient presents with  . Gastroesophageal Reflux    Tamara Church is a 44 y.o. female.  44 yo F with a chief complaints of throat discomfort and a feeling like she needs to belch.  This is been an ongoing problem for her.  Worsened last night had to wake up at about 1130 last night with worsening symptoms.  She has taken some nausea medicine and she has not vomited.  Still feels a bit nauseated.  Has been taking Nexium twice a day.  Has seen GI for this and had a recent endoscopy.  She took some baking soda with water last night and she thinks it caused her to have some diarrhea.  She felt dehydrated and so has been drinking lots of water.  Has been able to tolerate that without vomiting.  No fevers no cough.  No abdominal pain.  Denies exertional symptoms.  The history is provided by the patient.  Illness Severity:  Moderate Onset quality:  Gradual Duration:  1 day Timing:  Constant Progression:  Worsening Chronicity:  Recurrent Associated symptoms: nausea and sore throat   Associated symptoms: no abdominal pain, no chest pain, no congestion, no fever, no headaches, no myalgias, no rhinorrhea, no shortness of breath, no vomiting and no wheezing        Past Medical History:  Diagnosis Date  . Anemia   . GERD (gastroesophageal reflux disease)   . Hypertension     Patient Active Problem List   Diagnosis Date Noted  . Unintentional weight loss 05/18/2020  . Early satiety 05/18/2020  . Non-intractable vomiting 05/18/2020  . Constipation 05/18/2020  . Bacterial vaginosis 05/08/2020  . UTI (urinary tract infection) 04/23/2020  . Palpitations 03/22/2020  . Adjustment disorder with depressed mood 03/15/2020  . Seasonal allergies 02/08/2020  . Decreased hearing 12/15/2019  . CKD (chronic kidney disease) stage 2, GFR 60-89 ml/min  03/21/2012  . ANEMIA 09/07/2008  . GERD 09/04/2008  . OBESITY, NOS 11/18/2006  . HYPERTENSION, BENIGN SYSTEMIC 11/18/2006    Past Surgical History:  Procedure Laterality Date  . NO PAST SURGERIES       OB History    Gravida  6   Para  6   Term  6   Preterm  0   AB  0   Living  6     SAB  0   TAB  0   Ectopic  0   Multiple  0   Live Births              Family History  Problem Relation Age of Onset  . Hypertension Mother   . Colon cancer Neg Hx   . Esophageal cancer Neg Hx   . Rectal cancer Neg Hx   . Stomach cancer Neg Hx   . Inflammatory bowel disease Neg Hx   . Liver disease Neg Hx   . Pancreatic cancer Neg Hx     Social History   Tobacco Use  . Smoking status: Never Smoker  . Smokeless tobacco: Never Used  Vaping Use  . Vaping Use: Never used  Substance Use Topics  . Alcohol use: No  . Drug use: No    Home Medications Prior to Admission medications   Medication Sig Start Date End Date Taking? Authorizing Provider  amLODipine (NORVASC) 10 MG tablet Take 1 tablet (10  mg total) by mouth at bedtime. 05/14/20   Fayette Pho, MD  esomeprazole (NEXIUM) 40 MG capsule Take 1 capsule (40 mg total) by mouth daily at 12 noon. 05/26/20   Alvira Monday, MD  levonorgestrel (MIRENA) 20 MCG/24HR IUD 1 each by Intrauterine route once.    [provider]  ondansetron (ZOFRAN ODT) 4 MG disintegrating tablet Take 1 tablet (4 mg total) by mouth every 8 (eight) hours as needed for nausea or vomiting. 05/26/20   Alvira Monday, MD    Allergies    Ace inhibitors, Angiotensin receptor blockers, and Hctz [hydrochlorothiazide]  Review of Systems   Review of Systems  Constitutional: Negative for chills and fever.  HENT: Positive for sore throat. Negative for congestion and rhinorrhea.   Eyes: Negative for redness and visual disturbance.  Respiratory: Negative for shortness of breath and wheezing.   Cardiovascular: Negative for chest pain and  palpitations.  Gastrointestinal: Positive for nausea. Negative for abdominal pain and vomiting.  Genitourinary: Negative for dysuria and urgency.  Musculoskeletal: Negative for arthralgias and myalgias.  Skin: Negative for pallor and wound.  Neurological: Negative for dizziness and headaches.    Physical Exam Updated Vital Signs BP (!) 140/92 (BP Location: Right Arm)   Pulse (!) 106   Temp 98.4 F (36.9 C) (Oral)   Resp 18   Ht 5\' 2"  (1.575 m)   LMP 05/12/2020 (Approximate)   SpO2 99%   BMI 34.57 kg/m   Physical Exam Vitals and nursing note reviewed.  Constitutional:      General: She is not in acute distress.    Appearance: She is well-developed. She is obese. She is not diaphoretic.  HENT:     Head: Normocephalic and atraumatic.     Mouth/Throat:     Comments: No erythema, tolerating secretions without difficulty Eyes:     Pupils: Pupils are equal, round, and reactive to light.  Cardiovascular:     Rate and Rhythm: Normal rate and regular rhythm.     Heart sounds: No murmur heard.  No friction rub. No gallop.   Pulmonary:     Effort: Pulmonary effort is normal.     Breath sounds: No wheezing or rales.  Abdominal:     General: There is no distension.     Palpations: Abdomen is soft.     Tenderness: There is no abdominal tenderness. There is no guarding.     Comments: Benign abdominal exam  Musculoskeletal:        General: No tenderness.     Cervical back: Normal range of motion and neck supple.  Skin:    General: Skin is warm and dry.  Neurological:     Mental Status: She is alert and oriented to person, place, and time.  Psychiatric:        Behavior: Behavior normal.     ED Results / Procedures / Treatments   Labs (all labs ordered are listed, but only abnormal results are displayed) Labs Reviewed - No data to display  EKG EKG Interpretation  Date/Time:  Thursday May 30 2020 09:44:46 EDT Ventricular Rate:  99 PR Interval:    QRS  Duration: 93 QT Interval:  363 QTC Calculation: 466 R Axis:   15 Text Interpretation: Sinus rhythm Borderline T abnormalities, anterior leads No significant change since last tracing Confirmed by [id number] [phone]) on 05/30/2020 10:02:34 AM   Radiology No results found.  Procedures Procedures (including critical care time)  Medications Ordered in ED Medications  alum & mag hydroxide-simeth (  MAALOX/MYLANTA) 200-200-20 MG/5ML suspension 30 mL (30 mLs Oral Given 05/30/20 0936)    And  lidocaine (XYLOCAINE) 2 % viscous mouth solution 15 mL (15 mLs Oral Given 05/30/20 0936)    ED Course  I have reviewed the triage vital signs and the nursing notes.  Pertinent labs & imaging results that were available during my care of the patient were reviewed by me and considered in my medical decision making (see chart for details).    MDM Rules/Calculators/A&P                          44 yo F with a chief complaints of belching nausea and a feeling of discomfort in the throat.  This is been an ongoing issue for her.  Has seen GI and had endoscopy recently.  She is on a PPI twice a day.  Has had some improvement generally with this but worsened last night when she lays back flat to go to sleep.  I discussed with her foods to avoid and recommended nothing to eat or drink for hours before laying down for sleep.  Also suggested she call her GI doctor and see if they would like to change her PPI, or if they would like to see her in the office.  She had a recent visit here with a delta troponin that was negative.  Seems much less likely to be cardiac.  Will obtain an EKG.  EKG without change.  Feeling better post [name].   10:08 AM:  I have discussed the diagnosis/risks/treatment options with the patient and believe the pt to be eligible for discharge home to follow-up with GI. We also discussed returning to the ED immediately if new or worsening sx occur. We discussed the sx which are most concerning (e.g.,  sudden worsening pain, fever, inability to tolerate by mouth) that necessitate immediate return. Medications administered to the patient during their visit and any new prescriptions provided to the patient are listed below.  Medications given during this visit Medications  alum & mag hydroxide-simeth (MAALOX/MYLANTA) 200-200-20 MG/5ML suspension 30 mL (30 mLs Oral Given 05/30/20 0936)    And  lidocaine (XYLOCAINE) 2 % viscous mouth solution 15 mL (15 mLs Oral Given 05/30/20 0936)     The patient appears reasonably screen and/or stabilized for discharge and I doubt any other medical condition or other [facility] requiring further screening, evaluation, or treatment in the ED at this time prior to discharge.    Final Clinical Impression(s) / ED Diagnoses Final diagnoses:  Belching    Rx / DC Orders ED Discharge Orders    None       Melene Plan, DO 05/30/20 1008

## 2020-05-30 NOTE — ED Triage Notes (Signed)
Pt continues to have acid reflux.  Pt states she took baking soda and water last night which cause 4 episodes of diarrhea.  No fevers at home.  Pt continues to take nexium.

## 2020-05-30 NOTE — Telephone Encounter (Signed)
Appt made to see Dr Meridee Score on 11/5 at 830 am tired again to reach the pt and no answer and no voice mail

## 2020-06-12 ENCOUNTER — Telehealth: Payer: Self-pay | Admitting: Gastroenterology

## 2020-06-12 NOTE — Telephone Encounter (Signed)
Pt received your letter about test results. Pls call her.

## 2020-06-12 NOTE — Telephone Encounter (Signed)
Left message on machine to call back  

## 2020-06-13 NOTE — Telephone Encounter (Signed)
The patient has been notified of this information and all questions answered.   The pt will keep appt as planned for 11/5.

## 2020-06-18 NOTE — Progress Notes (Signed)
This encounter was created in error - please disregard.

## 2020-06-19 ENCOUNTER — Ambulatory Visit: Payer: Medicaid Other

## 2020-06-21 NOTE — Patient Instructions (Signed)
Visit Information  Goals Addressed              This Visit's Progress   .  COMPLETED: I woke up short of breath (pt-stated)        CARE PLAN ENTRY (see longitudinal plan of care for additional care plan information)  Current Barriers:  Marland Kitchen Knowledge Deficits related to RD event on 8/ 20/21-RNCM reached out to the patient to follow up on Ed visit on 05/10/30. Patient stated that she woke up the morning of 05-10-20 and she was short of breath and and her heart was racing.  The patient states that EMS was called.  She was evaluated and she states that they found that she was ok.  She was not seen in the ED  Nurse Case Manager Clinical Goal(s):  Marland Kitchen Over the next 10 days, patient will verbalize understanding of plan for GERD  Interventions:  . Inter-disciplinary care team collaboration (see longitudinal plan of care) . Discussed plans with patient for ongoing care management follow up and provided patient with direct contact information for care management team . Assessed the patient and she stated that she is felling better but has a problem with reflux and she was started on Nexium.  She has been in contact with her gastroenterologist and they are planning on doing test. Advised the patient about diet and drinks that can affect her reflux.  Things that she needs to stay away from. . Patient is continuing follow up with GI and taking Nexium for Reflux watching food that she eats.  Patient Self Care Activities:  . Patient verbalizes understanding of plan  . Attends all scheduled provider appointments . Performs ADL's independently . Performs IADL's independently . Calls provider office for new concerns or questions Initial goal documentation     .  COMPLETED: I woke up urinating a lot (pt-stated)        CARE PLAN ENTRY (see longitudinal plan of care for additional care plan information)  Current Barriers:  . Chronic Disease Management support and education needs related to post discharge  instructions on 04/20/20- including medications, and MD follow-up  = patient states that she woke up at 3 am in the morning and started to urinate and was unable to stop it scared her.  She went to the ER.  Nurse Case Manager Clinical Goal(s):  Marland Kitchen Over the next 10- days, patient will verbalize understanding of plan for medications and MD follow-up  Interventions:  . Inter-disciplinary care team collaboration (see longitudinal plan of care) . Evaluation of current treatment plan related to *post discharge instuctions and patient's adherence to plan as established by provider. . Reviewed medications with patient and discussed importance of taking all of antibiotics- the patient states that she has her medications and she is taking it. . Discussed plans with patient for ongoing care management follow up and provided patient with direct contact information for care management team . Reviewed scheduled/upcoming provider appointments including: 04/23/20 210 pm Dr. Homero Fellers.  Patient is aware of the appointment and states that she has transportation. . Patient states that she was given another antibiotic for UTI and it help with the symptoms     Patient Self Care Activities:  . Patient verbalizes understanding of plan to follow discharge instructions . Attends all scheduled provider appointments . Performs ADL's independently . Performs IADL's independently . Calls provider office for new concerns or questions   Initial goal documentation     .  They changed my medications  and it triggered a change in my blood pressure (pt-stated)        CARE PLAN ENTRY (see longitudinal plan of care for additional care plan information)  Current Barriers:  Marland Kitchen Knowledge Deficits related to basic understanding of hypertension pathophysiology and self care management  Nurse Case Manager Clinical Goal(s):  Marland Kitchen Over the next 30 days, patient will verbalize understanding of plan for hypertension management . Over the  next 90 days, patient will not experience hospital admission.    Interventions:  . Evaluation of current treatment plan related to hypertension self management and patient's adherence to plan as established by provider. . Will send education to patient re: stroke prevention, s/s of heart attack and stroke, DASH diet, complications of uncontrolled blood pressure, calendar . Reviewed medications with patient and discussed importance of compliance . Discussed plans with patient for ongoing care management follow up and provided patient with direct contact information for care management team . Advised patient, providing education and rationale, to monitor blood pressure daily and record, calling PCP for findings outside established parameters.  . Patient states that she is doing good.   Her last BP was 140/92.  She states that she has moved and her BP monitor is in storage and she needs to find it.  She has not checked it in the las few days.  Discussed with her the importance of checking and how BP affects the body.. . She states that she went to the ED and was having reflux from something she ate and had a CT .  It was normal but it did show a cyst and gallstone.  Discussed diet and possible elevations of bed to help and take her medications and prescribed.  Patient verbalized understanding.  Patient Self Care Activities:  . Self administers medications as prescribed . Attends all scheduled provider appointments . Calls provider office for new concerns, questions, or BP outside discussed parameters  Please see past updates related to this goal by clicking on the "Past Updates" button in the selected goal          Ms. Courtney was given information about Care Management services today including:  1. Care Management services include personalized support from designated clinical staff supervised by her physician, including individualized plan of care and coordination with other care  providers 2. 24/7 contact phone numbers for assistance for urgent and routine care needs. 3. The patient may stop CCM services at any time (effective at the end of the month) by phone call to the office staff.  Patient agreed to services and verbal consent obtained.   The patient verbalized understanding of instructions provided today and declined a print copy of patient instruction materials.   The care management team will reach out to the patient again over the next 21 days.   [name] RN, BSN, [facility] Care Management Coordinator [facility] Phone: [phone] Fax: [phone]

## 2020-06-21 NOTE — Chronic Care Management (AMB) (Signed)
Care Management   Follow Up Note   06/21/2020 Name: Tamara Church MRN: [id number] DOB: Jul 30, 1976  Referred by: Fayette Pho, MD Reason for referral : Chronic Care Management (HTN)   Tamara Church is a 44 y.o. year old female who is a primary care patient of Fayette Pho, MD. The care management team was consulted for assistance with care management and care coordination needs.    Review of patient status, including review of consultants reports, relevant laboratory and other test results, and collaboration with appropriate care team members and the patient's provider was performed as part of comprehensive patient evaluation and provision of chronic care management services.    SDOH (Social Determinants of Health) assessments performed: No See Care Plan activities for detailed interventions related to [facility])     Advanced Directives: See Care Plan and Vynca application for related entries.   Goals Addressed              This Visit's Progress   .  COMPLETED: I woke up short of breath (pt-stated)        CARE PLAN ENTRY (see longitudinal plan of care for additional care plan information)  Current Barriers:  Marland Kitchen Knowledge Deficits related to RD event on 8/ 20/21-RNCM reached out to the patient to follow up on Ed visit on 05/10/30. Patient stated that she woke up the morning of 05-10-20 and she was short of breath and and her heart was racing.  The patient states that EMS was called.  She was evaluated and she states that they found that she was ok.  She was not seen in the ED  Nurse Case Manager Clinical Goal(s):  Marland Kitchen Over the next 10 days, patient will verbalize understanding of plan for GERD  Interventions:  . Inter-disciplinary care team collaboration (see longitudinal plan of care) . Discussed plans with patient for ongoing care management follow up and provided patient with direct contact information for care management team . Assessed the patient and she stated that she  is felling better but has a problem with reflux and she was started on Nexium.  She has been in contact with her gastroenterologist and they are planning on doing test. Advised the patient about diet and drinks that can affect her reflux.  Things that she needs to stay away from. . Patient is continuing follow up with GI and taking Nexium for Reflux watching food that she eats.  Patient Self Care Activities:  . Patient verbalizes understanding of plan  . Attends all scheduled provider appointments . Performs ADL's independently . Performs IADL's independently . Calls provider office for new concerns or questions Initial goal documentation     .  COMPLETED: I woke up urinating a lot (pt-stated)        CARE PLAN ENTRY (see longitudinal plan of care for additional care plan information)  Current Barriers:  . Chronic Disease Management support and education needs related to post discharge instructions on 04/20/20- including medications, and MD follow-up  = patient states that she woke up at 3 am in the morning and started to urinate and was unable to stop it scared her.  She went to the ER.  Nurse Case Manager Clinical Goal(s):  Marland Kitchen Over the next 10- days, patient will verbalize understanding of plan for medications and MD follow-up  Interventions:  . Inter-disciplinary care team collaboration (see longitudinal plan of care) . Evaluation of current treatment plan related to *post discharge instuctions and patient's adherence to plan as established  by provider. . Reviewed medications with patient and discussed importance of taking all of antibiotics- the patient states that she has her medications and she is taking it. . Discussed plans with patient for ongoing care management follow up and provided patient with direct contact information for care management team . Reviewed scheduled/upcoming provider appointments including: 04/23/20 210 pm Dr. Homero Fellers.  Patient is aware of the appointment and states  that she has transportation. . Patient states that she was given another antibiotic for UTI and it help with the symptoms     Patient Self Care Activities:  . Patient verbalizes understanding of plan to follow discharge instructions . Attends all scheduled provider appointments . Performs ADL's independently . Performs IADL's independently . Calls provider office for new concerns or questions   Initial goal documentation     .  They changed my medications and it triggered a change in my blood pressure (pt-stated)        CARE PLAN ENTRY (see longitudinal plan of care for additional care plan information)  Current Barriers:  Marland Kitchen Knowledge Deficits related to basic understanding of hypertension pathophysiology and self care management  Nurse Case Manager Clinical Goal(s):  Marland Kitchen Over the next 30 days, patient will verbalize understanding of plan for hypertension management . Over the next 90 days, patient will not experience hospital admission.    Interventions:  . Evaluation of current treatment plan related to hypertension self management and patient's adherence to plan as established by provider. . Will send education to patient re: stroke prevention, s/s of heart attack and stroke, DASH diet, complications of uncontrolled blood pressure, calendar . Reviewed medications with patient and discussed importance of compliance . Discussed plans with patient for ongoing care management follow up and provided patient with direct contact information for care management team . Advised patient, providing education and rationale, to monitor blood pressure daily and record, calling PCP for findings outside established parameters.  . Patient states that she is doing good.   Her last BP was 140/92.  She states that she has moved and her BP monitor is in storage and she needs to find it.  She has not checked it in the las few days.  Discussed with her the importance of checking and how BP affects the  body.. . She states that she went to the ED and was having reflux from something she ate and had a CT .  It was normal but it did show a cyst and gallstone.  Discussed diet and possible elevations of bed to help and take her medications and prescribed.  Patient verbalized understanding.  Patient Self Care Activities:  . Self administers medications as prescribed . Attends all scheduled provider appointments . Calls provider office for new concerns, questions, or BP outside discussed parameters  Please see past updates related to this goal by clicking on the "Past Updates" button in the selected goal           The care management team will reach out to the patient again over the next 21 days.   [name] RN, BSN, [facility] Care Management Coordinator [facility] Phone: [phone] Fax: [phone]

## 2020-07-04 ENCOUNTER — Ambulatory Visit (HOSPITAL_COMMUNITY)
Admission: EM | Admit: 2020-07-04 | Discharge: 2020-07-04 | Disposition: A | Discharge status: Home / Self Care | Payer: Medicaid Other | Attending: Family Medicine | Admitting: Family Medicine

## 2020-07-04 ENCOUNTER — Other Ambulatory Visit: Payer: Self-pay

## 2020-07-04 ENCOUNTER — Encounter (HOSPITAL_COMMUNITY): Payer: Self-pay | Admitting: Emergency Medicine

## 2020-07-04 DIAGNOSIS — N760 Acute vaginitis: Secondary | ICD-10-CM | POA: Diagnosis not present

## 2020-07-04 DIAGNOSIS — Z3202 Encounter for pregnancy test, result negative: Secondary | ICD-10-CM | POA: Diagnosis not present

## 2020-07-04 DIAGNOSIS — N390 Urinary tract infection, site not specified: Secondary | ICD-10-CM | POA: Diagnosis not present

## 2020-07-04 DIAGNOSIS — R55 Syncope and collapse: Secondary | ICD-10-CM | POA: Insufficient documentation

## 2020-07-04 LAB — CBC
HCT: 37.3 % (ref 36.0–46.0)
Hemoglobin: 11.8 g/dL — ABNORMAL LOW (ref 12.0–15.0)
MCH: 28 pg (ref 26.0–34.0)
MCHC: 31.6 g/dL (ref 30.0–36.0)
MCV: 88.6 fL (ref 80.0–100.0)
Platelets: 255 10*3/uL (ref 150–400)
RBC: 4.21 MIL/uL (ref 3.87–5.11)
RDW: 14.5 % (ref 11.5–15.5)
WBC: 5.5 10*3/uL (ref 4.0–10.5)
nRBC: 0 % (ref 0.0–0.2)

## 2020-07-04 LAB — COMPREHENSIVE METABOLIC PANEL
ALT: 18 U/L (ref 0–44)
AST: 17 U/L (ref 15–41)
Albumin: 4.2 g/dL (ref 3.5–5.0)
Alkaline Phosphatase: 54 U/L (ref 38–126)
Anion gap: 12 (ref 5–15)
BUN: 15 mg/dL (ref 6–20)
CO2: 23 mmol/L (ref 22–32)
Calcium: 9.9 mg/dL (ref 8.9–10.3)
Chloride: 103 mmol/L (ref 98–111)
Creatinine, Ser: 0.8 mg/dL (ref 0.44–1.00)
GFR, Estimated: >60 mL/min (ref >60)
Glucose, Bld: 100 mg/dL — ABNORMAL HIGH (ref 70–99)
Potassium: 3.5 mmol/L (ref 3.5–5.1)
Sodium: 138 mmol/L (ref 135–145)
Total Bilirubin: 0.6 mg/dL (ref 0.3–1.2)
Total Protein: 8.2 g/dL — ABNORMAL HIGH (ref 6.5–8.1)

## 2020-07-04 LAB — POCT URINALYSIS DIPSTICK, ED / UC
Glucose, UA: NEGATIVE mg/dL
Ketones, ur: 15 mg/dL — AB
Nitrite: NEGATIVE
Protein, ur: 100 mg/dL — AB
Specific Gravity, Urine: 1.025 (ref 1.005–1.030)
Urobilinogen, UA: 0.2 mg/dL (ref 0.0–1.0)
pH: 5.5 (ref 5.0–8.0)

## 2020-07-04 LAB — POC URINE PREG, ED: Preg Test, Ur: NEGATIVE

## 2020-07-04 MED ORDER — FLUCONAZOLE 150 MG PO TABS: 150.0000 mg | ORAL_TABLET | Freq: Every day | ORAL | 0 refills | Status: DC

## 2020-07-04 NOTE — ED Triage Notes (Signed)
Pt c/o frequent urination and vaginal itching and discharge onset Tuesday. The last day of her LMP was Tuesday. Pt states yesterday she was at a funeral and passed out, she is concerned her iron or potassium is low.

## 2020-07-04 NOTE — Discharge Instructions (Signed)
Treating you for a yeast infection.  Checking some blood work as requested I will call you with any abnormal results. Follow up as needed for continued or worsening symptoms

## 2020-07-04 NOTE — ED Provider Notes (Signed)
[facility]    CSN: [id number] Arrival date & time: 07/04/20  7846      History   Chief Complaint Chief Complaint  Patient presents with  . Urinary Tract Infection  . Vaginitis    HPI Tamara Church is a 44 y.o. female.   Patient is a 44 year old female who presents today with vaginal itching, discharge and irritation.  This started Tuesday.  Started after her menstrual cycle that ended Tuesday.  She is also having frequent urination.  Denies any concern for STDs.  Recently had STD screening.  No abdominal pain, back pain or fevers. She is also would like her iron  levels checked and potassium checked.  Reporting she was had a fever yesterday and had a syncopal episode.  Denies any pain preceding this.  She denies any chest pain or shortness of breath.  Denies any dizziness, headache, blurred vision this time.  Believes she may have gotten overheated.     Past Medical History:  Diagnosis Date  . Anemia   . GERD (gastroesophageal reflux disease)   . Hypertension     Patient Active Problem List   Diagnosis Date Noted  . Unintentional weight loss 05/18/2020  . Early satiety 05/18/2020  . Non-intractable vomiting 05/18/2020  . Constipation 05/18/2020  . Bacterial vaginosis 05/08/2020  . UTI (urinary tract infection) 04/23/2020  . Palpitations 03/22/2020  . Adjustment disorder with depressed mood 03/15/2020  . Seasonal allergies 02/08/2020  . Decreased hearing 12/15/2019  . CKD (chronic kidney disease) stage 2, GFR 60-89 ml/min 03/21/2012  . ANEMIA 09/07/2008  . GERD 09/04/2008  . OBESITY, NOS 11/18/2006  . HYPERTENSION, BENIGN SYSTEMIC 11/18/2006    Past Surgical History:  Procedure Laterality Date  . NO PAST SURGERIES      OB History    Gravida  6   Para  6   Term  6   Preterm  0   AB  0   Living  6     SAB  0   TAB  0   Ectopic  0   Multiple  0   Live Births               Home Medications    Prior to Admission  medications   Medication Sig Start Date End Date Taking? Authorizing Provider  amLODipine (NORVASC) 10 MG tablet Take 1 tablet (10 mg total) by mouth at bedtime. 05/14/20  Yes Fayette Pho, MD  esomeprazole (NEXIUM) 40 MG capsule Take 1 capsule (40 mg total) by mouth daily at 12 noon. 05/26/20  Yes Alvira Monday, MD  levonorgestrel (MIRENA) 20 MCG/24HR IUD 1 each by Intrauterine route once.   Yes [provider]  fluconazole (DIFLUCAN) 150 MG tablet Take 1 tablet (150 mg total) by mouth daily. 07/04/20   Pistol Kessenich, Gloris Manchester A, NP  ondansetron (ZOFRAN ODT) 4 MG disintegrating tablet Take 1 tablet (4 mg total) by mouth every 8 (eight) hours as needed for nausea or vomiting. 05/26/20   Alvira Monday, MD    Family History Family History  Problem Relation Age of Onset  . Hypertension Mother   . Colon cancer Neg Hx   . Esophageal cancer Neg Hx   . Rectal cancer Neg Hx   . Stomach cancer Neg Hx   . Inflammatory bowel disease Neg Hx   . Liver disease Neg Hx   . Pancreatic cancer Neg Hx     Social History Social History   Tobacco Use  . Smoking  status: Never Smoker  . Smokeless tobacco: Never Used  Vaping Use  . Vaping Use: Never used  Substance Use Topics  . Alcohol use: No  . Drug use: No     Allergies   Ace inhibitors, Angiotensin receptor blockers, and Hctz [hydrochlorothiazide]   Review of Systems Review of Systems   Physical Exam Triage Vital Signs ED Triage Vitals  Enc Vitals Group     BP 07/04/20 0843 (!) 137/92     Pulse Rate 07/04/20 0843 88     Resp 07/04/20 0843 15     Temp 07/04/20 0843 98.3 F (36.8 C)     Temp Source 07/04/20 0843 Oral     SpO2 07/04/20 0843 100 %     Weight --      Height --      Head Circumference --      Peak Flow --      Pain Score 07/04/20 0841 0     Pain Loc --      Pain Edu? --      Excl. in GC? --    No data found.  Updated Vital Signs BP (!) 137/92 (BP Location: Left Arm)   Pulse 88   Temp 98.3 F (36.8 C)  (Oral)   Resp 15   LMP 06/27/2020   SpO2 100%   Visual Acuity Right Eye Distance:   Left Eye Distance:   Bilateral Distance:    Right Eye Near:   Left Eye Near:    Bilateral Near:     Physical Exam Vitals and nursing note reviewed.  Constitutional:      General: She is not in acute distress.    Appearance: Normal appearance. She is not ill-appearing, toxic-appearing or diaphoretic.  HENT:     Head: Normocephalic.     Nose: Nose normal.  Eyes:     Conjunctiva/sclera: Conjunctivae normal.  Pulmonary:     Effort: Pulmonary effort is normal.  Musculoskeletal:        General: Normal range of motion.     Cervical back: Normal range of motion.  Skin:    General: Skin is warm and dry.     Findings: No rash.  Neurological:     General: No focal deficit present.     Mental Status: She is alert.  Psychiatric:        Mood and Affect: Mood normal.      UC Treatments / Results  Labs (all labs ordered are listed, but only abnormal results are displayed) Labs Reviewed  POCT URINALYSIS DIPSTICK, ED / UC - Abnormal; Notable for the following components:      Result Value   Bilirubin Urine SMALL (*)    Ketones, ur 15 (*)    Hgb urine dipstick MODERATE (*)    Protein, ur 100 (*)    Leukocytes,Ua SMALL (*)    All other components within normal limits  URINE CULTURE  CBC  COMPREHENSIVE METABOLIC PANEL  POC URINE PREG, ED  CERVICOVAGINAL ANCILLARY ONLY    EKG   Radiology No results found.  Procedures Procedures (including critical care time)  Medications Ordered in UC Medications - No data to display  Initial Impression / Assessment and Plan / UC Course  I have reviewed the triage vital signs and the nursing notes.  Pertinent labs & imaging results that were available during my care of the patient were reviewed by me and considered in my medical decision making (see chart for details).  Vaginitis Patient has small leuks and moderate hemoglobin.  Sending for  culture. Treating for yeast infection based on symptoms.  Syncope Checking blood work as patient has requested.  No concerning signs or symptoms Follow up as needed for continued or worsening symptoms  Final Clinical Impressions(s) / UC Diagnoses   Final diagnoses:  Vaginitis and vulvovaginitis  Syncope, unspecified syncope type     Discharge Instructions     Treating you for a yeast infection.  Checking some blood work as requested I will call you with any abnormal results. Follow up as needed for continued or worsening symptoms     ED Prescriptions    Medication Sig Dispense Auth. Provider   fluconazole (DIFLUCAN) 150 MG tablet Take 1 tablet (150 mg total) by mouth daily. 2 tablet Dahlia Byes A, NP     PDMP not reviewed this encounter.   Janace Aris, NP 07/04/20 1007

## 2020-07-05 ENCOUNTER — Telehealth (HOSPITAL_COMMUNITY): Payer: Self-pay | Admitting: Emergency Medicine

## 2020-07-05 LAB — CERVICOVAGINAL ANCILLARY ONLY
Bacterial Vaginitis (gardnerella): POSITIVE — AB
Candida Glabrata: NEGATIVE
Candida Vaginitis: POSITIVE — AB
Chlamydia: NEGATIVE
Comment: NEGATIVE
Comment: NEGATIVE
Comment: NEGATIVE
Comment: NEGATIVE
Comment: NEGATIVE
Comment: NORMAL
Neisseria Gonorrhea: NEGATIVE
Trichomonas: NEGATIVE

## 2020-07-05 MED ORDER — METRONIDAZOLE 0.75 % VA GEL: 1.0000 | Freq: Every day | VAGINAL | 0 refills | Status: AC

## 2020-07-05 MED ORDER — METRONIDAZOLE 500 MG PO TABS: 500.0000 mg | ORAL_TABLET | Freq: Two times a day (BID) | ORAL | 0 refills | Status: DC

## 2020-07-05 NOTE — Telephone Encounter (Signed)
Patient requested metrogel, instead of Flagyl

## 2020-07-06 LAB — URINE CULTURE: Culture: >=100000 — AB

## 2020-07-08 ENCOUNTER — Ambulatory Visit: Payer: Medicaid Other | Admitting: Family Medicine

## 2020-07-08 ENCOUNTER — Telehealth (HOSPITAL_COMMUNITY): Payer: Self-pay | Admitting: Emergency Medicine

## 2020-07-08 MED ORDER — NITROFURANTOIN MONOHYD MACRO 100 MG PO CAPS: 100.0000 mg | ORAL_CAPSULE | Freq: Two times a day (BID) | ORAL | 0 refills | Status: DC

## 2020-07-08 NOTE — Telephone Encounter (Signed)
Received voicemail from patient that prescription did not go through.  Verified receipt did not go through and called to leave voicemail for verbal order.  Patient updated.

## 2020-07-08 NOTE — Telephone Encounter (Signed)
Attempted to send prescription for patient's positive UTI.  Will monitor and call in if receipt does not go through

## 2020-07-12 ENCOUNTER — Ambulatory Visit: Payer: Medicaid Other

## 2020-07-12 NOTE — Telephone Encounter (Signed)
This encounter was created in error - please disregard.

## 2020-07-12 NOTE — Chronic Care Management (AMB) (Signed)
  Care Management   Follow Up Note   07/12/2020 Name: Tamara Church MRN: [id number] DOB: 07-01-1976  Referred by: Fayette Pho, MD Reason for referral : Chronic Care Management (HTN)   Tamara Church is a 44 y.o. year old female who is a primary care patient of Fayette Pho, MD. The care management team was consulted for assistance with care management and care coordination needs.    Review of patient status, including review of consultants reports, relevant laboratory and other test results, and collaboration with appropriate care team members and the patient's provider was performed as part of comprehensive patient evaluation and provision of chronic care management services.    SDOH (Social Determinants of Health) assessments performed: No See Care Plan activities for detailed interventions related to [facility])     Advanced Directives: See Care Plan and Vynca application for related entries.   Goals Addressed              This Visit's Progress   .  BP taken was 160/92 (pt-stated)        CARE PLAN ENTRY (see longitudinal plan of care for additional care plan information)  Current Barriers:  Marland Kitchen Knowledge Deficits related to basic understanding of hypertension pathophysiology and self care management  Nurse Case Manager Clinical Goal(s):  Marland Kitchen Over the next 30 days, patient will verbalize understanding of plan for hypertension management . Over the next 90 days, patient will not experience hospital admission.    Interventions:  . Evaluation of current treatment plan related to hypertension self management and patient's adherence to plan as established by provider. . Will send education to patient re: stroke prevention, s/s of heart attack and stroke, DASH diet, complications of uncontrolled blood pressure, calendar . Reviewed medications with patient and discussed importance of compliance . Discussed plans with patient for ongoing care management follow up and provided  patient with direct contact information for care management team . Advised patient, providing education and rationale, to monitor blood pressure daily and record, calling PCP for findings outside established parameters.  . Patient she is doing good.  She had to go to urgent care again. She had another UTI.  They are not sure what caused it. . She states that while there her first BP taken was 130/92 a little high but she said she had been up all night .  They retook her BP and it was 126/86. . She is not checking her BP.  Discuss why it is important .  How BP can affect her body.  Discussed her diet.  She said that she will start to check and have values for me at the next talk .  We reviewed the signs and symptoms of HTN.   Patient Self Care Activities:  . Self administers medications as prescribed . Attends all scheduled provider appointments . Calls provider office for new concerns, questions, or BP outside discussed parameters  Please see past updates related to this goal by clicking on the "Past Updates" button in the selected goal           The care management team will reach out to the patient again over the next 21 days.   [name] RN, BSN, [facility] Care Management Coordinator [facility] Phone: [phone] Fax: [phone]

## 2020-07-12 NOTE — Patient Instructions (Signed)
Visit Information  Goals Addressed              This Visit's Progress   .  BP taken was 160/92 (pt-stated)        CARE PLAN ENTRY (see longitudinal plan of care for additional care plan information)  Current Barriers:  Marland Kitchen Knowledge Deficits related to basic understanding of hypertension pathophysiology and self care management  Nurse Case Manager Clinical Goal(s):  Marland Kitchen Over the next 30 days, patient will verbalize understanding of plan for hypertension management . Over the next 90 days, patient will not experience hospital admission.    Interventions:  . Evaluation of current treatment plan related to hypertension self management and patient's adherence to plan as established by provider. . Will send education to patient re: stroke prevention, s/s of heart attack and stroke, DASH diet, complications of uncontrolled blood pressure, calendar . Reviewed medications with patient and discussed importance of compliance . Discussed plans with patient for ongoing care management follow up and provided patient with direct contact information for care management team . Advised patient, providing education and rationale, to monitor blood pressure daily and record, calling PCP for findings outside established parameters.  . Patient she is doing good.  She had to go to urgent care again. She had another UTI.  They are not sure what caused it. . She states that while there her first BP taken was 130/92 a little high but she said she had been up all night .  They retook her BP and it was 126/86. . She is not checking her BP.  Discuss why it is important .  How BP can affect her body.  Discussed her diet.  She said that she will start to check and have values for me at the next talk .  We reviewed the signs and symptoms of HTN.   Patient Self Care Activities:  . Self administers medications as prescribed . Attends all scheduled provider appointments . Calls provider office for new concerns,  questions, or BP outside discussed parameters  Please see past updates related to this goal by clicking on the "Past Updates" button in the selected goal          Ms. Soderman was given information about Care Management services today including:  1. Care Management services include personalized support from designated clinical staff supervised by her physician, including individualized plan of care and coordination with other care providers 2. 24/7 contact phone numbers for assistance for urgent and routine care needs. 3. The patient may stop CCM services at any time (effective at the end of the month) by phone call to the office staff.  Patient agreed to services and verbal consent obtained.   The patient verbalized understanding of instructions provided today and declined a print copy of patient instruction materials.   The care management team will reach out to the patient again over the next 21 days.   [name] RN, BSN, [facility] Care Management Coordinator [facility] Phone: [phone] Fax: [phone]

## 2020-07-15 ENCOUNTER — Telehealth: Payer: Self-pay | Admitting: Gastroenterology

## 2020-07-15 NOTE — Telephone Encounter (Signed)
Pt is requesting a call back from a nurse to go over her medications with her, pt does not remember which medication she needs a refill on.

## 2020-07-16 MED ORDER — ESOMEPRAZOLE MAGNESIUM 40 MG PO CPDR: 40.0000 mg | DELAYED_RELEASE_CAPSULE | Freq: Two times a day (BID) | ORAL | 6 refills | Status: DC

## 2020-07-16 NOTE — Telephone Encounter (Signed)
Thank you Patty for taking care of this for me. Joey- in the future if you see that I have not responded to a pt, you can  always pick up the phone and say Dagon Budai this pt is calling again, rather stating that I did not return her call. I have multiple responsibilities and can't always return every call the same day. In this case it was not an emergency and we do 24-72 hrs to return pt calls. Having the correct information in the calls also helps. Alexia Freestone is the nurse. I am the CMA. If you ever have questions, please ask.

## 2020-07-16 NOTE — Telephone Encounter (Deleted)
Pt is requesting a call back from a nurse to discuss her medications, pt states Rovonda did not return her call.

## 2020-07-16 NOTE — Telephone Encounter (Signed)
RO I spoke with her.  Nothing further needs to be done  I called the pt and she states Nexium is what she needs refilled.  She says that she contacted the pharmacy on Friday to send a request for a refill and no one responded.  She says she called our office yesterday and no one returned her call.  I researched and see that we did not prescribe the nexium and the request was not sent to our office but to  The original prescriber.  Dr Meridee Score does want her on nexium 40 mg BID.  I sent a refill and she has an appt that I advised her to keep on 11/5.    Tamara Church

## 2020-07-26 ENCOUNTER — Encounter: Payer: Self-pay | Admitting: Gastroenterology

## 2020-07-26 ENCOUNTER — Ambulatory Visit (INDEPENDENT_AMBULATORY_CARE_PROVIDER_SITE_OTHER): Payer: Medicaid Other | Admitting: Gastroenterology

## 2020-07-26 VITALS — BP 128/86 | HR 78 | Ht 62.0 in | Wt 193.0 lb

## 2020-07-26 DIAGNOSIS — K2960 Other gastritis without bleeding: Secondary | ICD-10-CM

## 2020-07-26 DIAGNOSIS — R634 Abnormal weight loss: Secondary | ICD-10-CM | POA: Diagnosis not present

## 2020-07-26 DIAGNOSIS — K449 Diaphragmatic hernia without obstruction or gangrene: Secondary | ICD-10-CM | POA: Diagnosis not present

## 2020-07-26 DIAGNOSIS — R1013 Epigastric pain: Secondary | ICD-10-CM | POA: Diagnosis not present

## 2020-07-26 DIAGNOSIS — K2100 Gastro-esophageal reflux disease with esophagitis, without bleeding: Secondary | ICD-10-CM

## 2020-07-26 DIAGNOSIS — K2970 Gastritis, unspecified, without bleeding: Secondary | ICD-10-CM

## 2020-07-26 MED ORDER — ESOMEPRAZOLE MAGNESIUM 40 MG PO CPDR: 40.0000 mg | DELAYED_RELEASE_CAPSULE | Freq: Two times a day (BID) | ORAL | 6 refills | Status: DC

## 2020-07-26 NOTE — Patient Instructions (Addendum)
If you are age 43 or older, your body mass index should be between 23-30. Your Body mass index is 35.3 kg/m. If this is out of the aforementioned range listed, please consider follow up with your Primary Care Provider.  If you are age 47 or younger, your body mass index should be between 19-25. Your Body mass index is 35.3 kg/m. If this is out of the aformentioned range listed, please consider follow up with your Primary Care Provider.    Continue Nexium 40 MG twice a day.  We have sent the following medications to your pharmacy for you to pick up at your convenience: Nexium 40 MG  Dr. Laurita Quint would like to see you again in January or February. Our office will contact you for this appointment.  Thank you for entrusting me with your care and choosing [facility].  Dr. Laurita Quint

## 2020-07-27 ENCOUNTER — Encounter: Payer: Self-pay | Admitting: Gastroenterology

## 2020-07-27 DIAGNOSIS — K2970 Gastritis, unspecified, without bleeding: Secondary | ICD-10-CM

## 2020-07-27 DIAGNOSIS — R1013 Epigastric pain: Secondary | ICD-10-CM | POA: Insufficient documentation

## 2020-07-27 DIAGNOSIS — K2990 Gastroduodenitis, unspecified, without bleeding: Secondary | ICD-10-CM | POA: Insufficient documentation

## 2020-07-27 HISTORY — DX: Gastritis, unspecified, without bleeding: K29.70

## 2020-07-27 HISTORY — DX: Epigastric pain: R10.13

## 2020-07-27 NOTE — Progress Notes (Signed)
GASTROENTEROLOGY OUTPATIENT CLINIC VISIT   Primary Care Provider Fayette Pho, MD [address] [place] [address] [phone]  Patient Profile: Tamara Church is a 44 y.o. female with a pmh significant for hypertension, anemia, GERD (microscopic esophagitis noted on biopsies), hiatal hernia.  The patient presents to the [facility] for an evaluation and management of problem(s) noted below:  Problem List 1. Gastroesophageal reflux disease with esophagitis without hemorrhage   2. Abdominal pain, epigastric   3. Other gastritis without hemorrhage, unspecified chronicity   4. Hiatal hernia   5. Unintentional weight loss     History of Present Illness Please see initial consultation note by NP [facility] as well as my prior progress notes for full details of HPI.  Interval History Today, the patient returns for a scheduled visit.  The patient underwent CT abdomen pelvis with contrast imaging with results as below since her last visit.  Today, the patient states that she is finally doing well.  She continues to take PPI twice daily at appropriate times before meals.  She has no significant GERD symptoms at this time.  Her abdominal pain has completely subsided at this time.  She has stabilized her weight and is actually gaining weight at this time.  The patient is able to eat and not have issues regards to any early satiety or nausea or vomiting or pain.  She denies any fevers or chills.  No change in her bowel habits currently.  No blood in her stools.    GI Review of Systems Positive as above Negative for dysphagia, odynophagia, melena, hematochezia, abdominal pain, nausea, vomiting odynophagia, dysphagia, pain, melena, hematochezia  Review of Systems General: Denies fevers/chills/unintentional weight loss Cardiovascular: Denies chest pain/palpitations Pulmonary: Denies shortness of breath Gastroenterological: See HPI Genitourinary: Denies darkened  urine Hematological: Denies easy bruising/bleeding Dermatological: Denies jaundice Psychological: Mood is stable   Medications Current Outpatient Medications  Medication Sig Dispense Refill  . amLODipine (NORVASC) 10 MG tablet Take 1 tablet (10 mg total) by mouth at bedtime. 90 tablet 3  . esomeprazole (NEXIUM) 40 MG capsule Take 1 capsule (40 mg total) by mouth 2 (two) times daily before a meal. 60 capsule 6  . levonorgestrel (MIRENA) 20 MCG/24HR IUD 1 each by Intrauterine route once.     No current facility-administered medications for this visit.    Allergies Allergies  Allergen Reactions  . Ace Inhibitors Cough  . Angiotensin Receptor Blockers     cough  . Hctz [Hydrochlorothiazide]     hypokalemia    Histories Past Medical History:  Diagnosis Date  . Anemia   . GERD (gastroesophageal reflux disease)   . Hypertension    Past Surgical History:  Procedure Laterality Date  . NO PAST SURGERIES     Social History   Socioeconomic History  . Marital status: Single    Spouse name: Not on file  . Number of children: Not on file  . Years of education: Not on file  . Highest education level: Not on file  Occupational History  . Not on file  Tobacco Use  . Smoking status: Never Smoker  . Smokeless tobacco: Never Used  Vaping Use  . Vaping Use: Never used  Substance and Sexual Activity  . Alcohol use: No  . Drug use: No  . Sexual activity: Yes    Birth control/protection: None, I.U.D.    Comment: pt would like to start depo provera today  Other Topics Concern  . Not on file  Social History Narrative  . Not on file   Social Determinants of Health   Financial Resource Strain:   . Difficulty of Paying Living Expenses: Not on file  Food Insecurity:   . Worried About Programme researcher, broadcasting/film/video in the Last Year: Not on file  . Ran Out of Food in the Last Year: Not on file  Transportation Needs:   . Lack of Transportation (Medical): Not on file  . Lack of  Transportation (Non-Medical): Not on file  Physical Activity:   . Days of Exercise per Week: Not on file  . Minutes of Exercise per Session: Not on file  Stress:   . Feeling of Stress : Not on file  Social Connections:   . Frequency of Communication with Friends and Family: Not on file  . Frequency of Social Gatherings with Friends and Family: Not on file  . Attends Religious Services: Not on file  . Active Member of Clubs or Organizations: Not on file  . Attends Banker Meetings: Not on file  . Marital Status: Not on file  Intimate Partner Violence:   . Fear of Current or Ex-Partner: Not on file  . Emotionally Abused: Not on file  . Physically Abused: Not on file  . Sexually Abused: Not on file   Family History  Problem Relation Age of Onset  . Hypertension Mother   . Colon cancer Neg Hx   . Esophageal cancer Neg Hx   . Rectal cancer Neg Hx   . Stomach cancer Neg Hx   . Inflammatory bowel disease Neg Hx   . Liver disease Neg Hx   . Pancreatic cancer Neg Hx    I have reviewed her medical, social, and family history in detail and updated the electronic medical record as necessary.    PHYSICAL EXAMINATION  BP 128/86   Pulse 78   Ht 5\' 2"  (1.575 m)   Wt 193 lb (87.5 kg)   LMP 06/27/2020   BMI 35.30 kg/m  Wt Readings from Last 3 Encounters:  07/26/20 193 lb (87.5 kg)  05/26/20 189 lb (85.7 kg)  05/16/20 189 lb (85.7 kg)  GEN: NAD, appears stated age, doesn't appear chronically ill PSYCH: Cooperative, without pressured speech EYE: Conjunctivae pink, sclerae anicteric ENT: MMM CV: Nontachycardic  RESP: No audible wheezing GI: NABS, soft, rounded, protuberant, nontender, without rebound or guarding MSK/EXT: No significant lower extremity edema SKIN: No jaundice NEURO:  Alert & Oriented x 3, no focal deficits   REVIEW OF DATA  I reviewed the following data at the time of this encounter:  GI Procedures and Studies  Previously reviewed EGD  Laboratory  Studies  Reviewed those in [facility]  Imaging Studies  September 2021 CT abdomen pelvis with contrast IMPRESSION: 1. Mild circumferential bladder wall thickening is nonspecific given underdistention. Correlate for urinary symptoms. 2. Punctate nonobstructing bilateral nephrolithiasis. No obstructive uropathy or hydronephrosis. 3. Cholelithiasis without evidence of acute cholecystitis. 4. Small sliding-type hiatal hernia. 5. Anteverted uterus with expected positioning of the radiopaque IUD.   ASSESSMENT  Ms. Roddy is a 44 y.o. female with a pmh significant for hypertension, anemia, GERD (microscopic esophagitis noted on biopsies), hiatal hernia.  The patient is seen today for evaluation and management of:  1. Gastroesophageal reflux disease with esophagitis without hemorrhage   2. Abdominal pain, epigastric   3. Other gastritis without hemorrhage, unspecified chronicity   4. Hiatal hernia   5. Unintentional weight loss    The patient is clinically and hemodynamically  stable at this time.  She has had a much better control of her symptoms over the course of the last few weeks.  Her weight is increasing as well at this time.  Her GERD symptoms are under better control and remain so on twice daily PPI.  At this point in time we will hold on additional work-up and management including the prior discussed gastric emptying study.  We will monitor her symptoms.  I would like in the course of the coming months to decrease her PPI back down to once daily and see if that will help maintain her in a good status.  However, she feels uncomfortable with this with the upcoming holidays and so I am okay with maintaining twice daily PPI for now.  The plan will be in January or February to begin down titration if possible depending on how the patient is doing.  The patient will be due for colon cancer screening at the age of 78.  All patient questions were answered to the best of my ability, and the patient agrees  to the aforementioned plan of action with follow-up as indicated.   PLAN  Continue Nexium 40 mg twice daily Hold on solid food gastric emptying study unless patient symptoms recur Follow-up in January/February and if patient symptoms are stable we will begin down titration of PPI Colon cancer screening in 2023 at age of 76   No orders of the defined types were placed in this encounter.   New Prescriptions   No medications on file   Modified Medications   Modified Medication Previous Medication   ESOMEPRAZOLE (NEXIUM) 40 MG CAPSULE esomeprazole (NEXIUM) 40 MG capsule      Take 1 capsule (40 mg total) by mouth 2 (two) times daily before a meal.    Take 1 capsule (40 mg total) by mouth 2 (two) times daily before a meal.    Planned Follow Up No follow-ups on file.   Total Time in Face-to-Face and in Coordination of Care for patient including independent/personal interpretation/review of prior testing, medical history, examination, medication adjustment, communicating results with the patient directly, and documentation with the EHR is 25 minutes.   [name], MD [facility] Office # [phone]

## 2020-08-02 ENCOUNTER — Telehealth: Payer: Medicaid Other

## 2020-08-12 ENCOUNTER — Ambulatory Visit: Payer: Medicaid Other

## 2020-08-13 NOTE — Patient Instructions (Signed)
Visit Information   Patient Goals/Self Care Activities:  . Self administers medications as prescribed . Attends all scheduled provider appointments . Calls provider office for new concerns, questions, or BP outside discussed parameters   Tamara Church was given information about Care Management services today including:  1. Care Management services include personalized support from designated clinical staff supervised by her physician, including individualized plan of care and coordination with other care providers 2. 24/7 contact phone numbers for assistance for urgent and routine care needs. 3. The patient may stop CCM services at any time (effective at the end of the month) by phone call to the office staff.  Patient agreed to services and verbal consent obtained.   The patient verbalized understanding of instructions, educational materials, and care plan provided today and declined offer to receive copy of patient instructions, educational materials, and care plan.   The care management team will reach out to the patient again over the next 21 days.   [name] RN, BSN, [facility] Care Management Coordinator [facility] Phone: [phone] Fax: [phone]

## 2020-08-13 NOTE — Chronic Care Management (AMB) (Signed)
   RN  Care Management   Follow Up Note   08/13/2020 Name: Tamara Church MRN: [id number] DOB: 01-06-1976  Reason for referral : Chronic Care Management (HTN)   Tamara Church is a 44 y.o. year old female who is a primary care patient of Fayette Pho, MD. The care management team was consulted for assistance with care management and care coordination needs.      Assessment: Called the patient and she states that she is doing well no problems with her blood pressure.   .  BP taken was 160/92 (pt-stated)         Current Barriers:  Marland Kitchen Knowledge Deficits related to basic understanding of hypertension pathophysiology and self care management  Nurse Case Manager Clinical Goal(s):  Marland Kitchen Over the next 30 days, patient will verbalize understanding of plan for hypertension management . Over the next 90 days, patient will not experience hospital admission.    Interventions:  . Evaluation of current treatment plan related to hypertension self management and patient's adherence to plan as established by provider. . Will send education to patient re: stroke prevention, s/s of heart attack and stroke, DASH diet, complications of uncontrolled blood pressure, calendar . Reviewed medications with patient and discussed importance of compliance . Discussed plans with patient for ongoing care management follow up and provided patient with direct contact information for care management team . Advised patient, providing education and rationale, to monitor blood pressure daily and record, calling PCP for findings outside established parameters.  . Patient she is doing good.  She has been checking her BP 129/89, 130/90 and last week 132/95 she is checking 3x/week.  She is taking her medications and monitoring her sodium intake. . She states that she had an appointment with GI and is continuing to take Protonix Bid  Patient Goals/Self Care Activities:  . Self administers medications as  prescribed . Attends all scheduled provider appointments . Calls provider office for new concerns, questions, or BP outside discussed parameters  Please see past updates related to this goal by clicking on the "Past Updates" button in the selected goal           Review of patient status, including review of consultants reports, relevant laboratory and other test results, and collaboration with appropriate care team members and the patient's provider was performed as part of comprehensive patient evaluation and provision of chronic care management services.    SDOH (Social Determinants of Health) assessments performed: No See Care Plan activities for detailed interventions related to [facility])       The care management team will reach out to the patient again over the next 21 days.    [name] RN, BSN, [facility] Care Management Coordinator [facility] Phone: [phone] Fax: [phone]

## 2020-08-24 ENCOUNTER — Emergency Department (HOSPITAL_BASED_OUTPATIENT_CLINIC_OR_DEPARTMENT_OTHER): Payer: Medicaid Other

## 2020-08-24 ENCOUNTER — Other Ambulatory Visit: Payer: Self-pay

## 2020-08-24 ENCOUNTER — Emergency Department (HOSPITAL_BASED_OUTPATIENT_CLINIC_OR_DEPARTMENT_OTHER)
Admission: EM | Admit: 2020-08-24 | Discharge: 2020-08-24 | Disposition: A | Discharge status: Home / Self Care | Payer: Medicaid Other | Attending: Emergency Medicine | Admitting: Emergency Medicine

## 2020-08-24 DIAGNOSIS — N182 Chronic kidney disease, stage 2 (mild): Secondary | ICD-10-CM | POA: Diagnosis not present

## 2020-08-24 DIAGNOSIS — I129 Hypertensive chronic kidney disease with stage 1 through stage 4 chronic kidney disease, or unspecified chronic kidney disease: Secondary | ICD-10-CM | POA: Diagnosis not present

## 2020-08-24 DIAGNOSIS — R079 Chest pain, unspecified: Secondary | ICD-10-CM | POA: Diagnosis not present

## 2020-08-24 DIAGNOSIS — K219 Gastro-esophageal reflux disease without esophagitis: Secondary | ICD-10-CM | POA: Diagnosis not present

## 2020-08-24 DIAGNOSIS — R000 Tachycardia, unspecified: Secondary | ICD-10-CM | POA: Diagnosis not present

## 2020-08-24 DIAGNOSIS — Z79899 Other long term (current) drug therapy: Secondary | ICD-10-CM | POA: Insufficient documentation

## 2020-08-24 LAB — BASIC METABOLIC PANEL
Anion gap: 15 (ref 5–15)
BUN: 9 mg/dL (ref 6–20)
CO2: 22 mmol/L (ref 22–32)
Calcium: 9.6 mg/dL (ref 8.9–10.3)
Chloride: 100 mmol/L (ref 98–111)
Creatinine, Ser: 0.84 mg/dL (ref 0.44–1.00)
GFR, Estimated: >60 mL/min (ref >60)
Glucose, Bld: 122 mg/dL — ABNORMAL HIGH (ref 70–99)
Potassium: 3.6 mmol/L (ref 3.5–5.1)
Sodium: 137 mmol/L (ref 135–145)

## 2020-08-24 LAB — CBC
HCT: 38.1 % (ref 36.0–46.0)
Hemoglobin: 12.6 g/dL (ref 12.0–15.0)
MCH: 29 pg (ref 26.0–34.0)
MCHC: 33.1 g/dL (ref 30.0–36.0)
MCV: 87.6 fL (ref 80.0–100.0)
Platelets: 302 10*3/uL (ref 150–400)
RBC: 4.35 MIL/uL (ref 3.87–5.11)
RDW: 14 % (ref 11.5–15.5)
WBC: 6.8 10*3/uL (ref 4.0–10.5)
nRBC: 0 % (ref 0.0–0.2)

## 2020-08-24 LAB — TROPONIN I (HIGH SENSITIVITY): Troponin I (High Sensitivity): <2 ng/L (ref <18)

## 2020-08-24 MED ORDER — ALUM & MAG HYDROXIDE-SIMETH 200-200-20 MG/5ML PO SUSP
30.0000 mL | Freq: Once | ORAL | Status: AC
  Administered 2020-08-24: 30 mL via ORAL
  Filled 2020-08-24: qty 30

## 2020-08-24 MED ORDER — ACETAMINOPHEN 325 MG PO TABS
650.0000 mg | ORAL_TABLET | Freq: Once | ORAL | Status: AC
  Administered 2020-08-24: 650 mg via ORAL
  Filled 2020-08-24: qty 2

## 2020-08-24 MED ORDER — FAMOTIDINE IN NACL 20-0.9 MG/50ML-% IV SOLN
20.0000 mg | Freq: Once | INTRAVENOUS | Status: AC
  Administered 2020-08-24: 20 mg via INTRAVENOUS
  Filled 2020-08-24: qty 50

## 2020-08-24 MED ORDER — LIDOCAINE VISCOUS HCL 2 % MT SOLN
15.0000 mL | Freq: Once | OROMUCOSAL | Status: AC
  Administered 2020-08-24: 15 mL via ORAL
  Filled 2020-08-24: qty 15

## 2020-08-24 NOTE — ED Provider Notes (Signed)
[facility] EMERGENCY DEPARTMENT Provider Note   CSN: [id number] Arrival date & time: 08/24/20  1011     History Chief Complaint  Patient presents with  . Chest Pain    Tamara Church is a 44 y.o. female who presents with concern for central and left-sided chest pressure pressure that radiates to the left arm since last night at 11 PM.  Patient states she is also been belching quite a bit since last night.  She states that she was sitting up watching TV when the pain gradually started; did not resolve throughout the night, she got very little sleep.  Patient reports history of severe GERD in the past, she is on Nexium twice daily per her gastroenterologist.  She states she had very similar chest pain when she was initially diagnosed with GERD several years ago.  She states she usually has no issues, however has had some stress at home the last few weeks, grieving the death of her son. She states that her son, Fayrene Fearing, died this past 2022-10-30, and has been very stressful to enter the holiday season for the first time without him here.  Patient is tearful while explaining this to me in the room.  She denies shortness of breath, palpitations.  She denies abdominal pain, nausea, vomiting, diarrhea.  Recent bowel movement was yesterday, was mildly constipated.  She denies dysuria, hematuria, urinary frequency or urgency.  Denies fevers or chills at home.  She does endorse headache at this time.  She denies recent travel, recent surgical procedure, or prolonged immobilization.  She denies hormone replacement, denies history of malignancy. Denies history of blood clot.  I have personally reviewed the patient's medical records.  She has history of obesity, hypertension, hiatal hernia, CKD, GERD, gastritis and gastroduodenitis.  HPI     Past Medical History:  Diagnosis Date  . Anemia   . GERD (gastroesophageal reflux disease)   . Hypertension     Patient Active Problem List    Diagnosis Date Noted  . Gastritis and gastroduodenitis 07/27/2020  . Abdominal pain, epigastric 07/27/2020  . Unintentional weight loss 05/18/2020  . Early satiety 05/18/2020  . Non-intractable vomiting 05/18/2020  . Constipation 05/18/2020  . Bacterial vaginosis 05/08/2020  . UTI (urinary tract infection) 04/23/2020  . Palpitations 03/22/2020  . Adjustment disorder with depressed mood 03/15/2020  . Seasonal allergies 02/08/2020  . Decreased hearing 12/15/2019  . CKD (chronic kidney disease) stage 2, GFR 60-89 ml/min 03/21/2012  . ANEMIA 09/07/2008  . GERD 09/04/2008  . OBESITY, NOS 11/18/2006  . HYPERTENSION, BENIGN SYSTEMIC 11/18/2006    Past Surgical History:  Procedure Laterality Date  . NO PAST SURGERIES       OB History    Gravida  6   Para  6   Term  6   Preterm  0   AB  0   Living  6     SAB  0   TAB  0   Ectopic  0   Multiple  0   Live Births              Family History  Problem Relation Age of Onset  . Hypertension Mother   . Colon cancer Neg Hx   . Esophageal cancer Neg Hx   . Rectal cancer Neg Hx   . Stomach cancer Neg Hx   . Inflammatory bowel disease Neg Hx   . Liver disease Neg Hx   . Pancreatic cancer Neg Hx  Social History   Tobacco Use  . Smoking status: Never Smoker  . Smokeless tobacco: Never Used  Vaping Use  . Vaping Use: Never used  Substance Use Topics  . Alcohol use: No  . Drug use: No    Home Medications Prior to Admission medications   Medication Sig Start Date End Date Taking? Authorizing Provider  amLODipine (NORVASC) 10 MG tablet Take 1 tablet (10 mg total) by mouth at bedtime. 05/14/20   Fayette PhoLynn, Catherine, MD  esomeprazole (NEXIUM) 40 MG capsule Take 1 capsule (40 mg total) by mouth 2 (two) times daily before a meal. 07/26/20 08/25/20  Mansouraty, Netty StarringGabriel Jr., MD  levonorgestrel (MIRENA) 20 MCG/24HR IUD 1 each by Intrauterine route once.    [provider]    Allergies    Ace inhibitors,  Angiotensin receptor blockers, and Hctz [hydrochlorothiazide]  Review of Systems   Review of Systems  Constitutional: Negative for activity change, appetite change, chills, diaphoresis, fatigue and fever.  HENT: Negative.   Respiratory: Negative for chest tightness, shortness of breath and wheezing.   Cardiovascular: Positive for chest pain. Negative for palpitations and leg swelling.  Gastrointestinal: Negative for abdominal pain, nausea and vomiting.       Belching.  Genitourinary: Negative.   Musculoskeletal: Negative.   Skin: Negative.   Allergic/Immunologic: Negative.   Psychiatric/Behavioral: Negative.     Physical Exam Updated Vital Signs BP 135/81 (BP Location: Left Arm)   Pulse 90   Temp 98.4 F (36.9 C) (Oral)   Resp 20   Ht 5\' 5"  (1.651 m)   Wt 87.9 kg   SpO2 100%   BMI 32.25 kg/m   Physical Exam Vitals and nursing note reviewed.  Constitutional:      Appearance: She is obese.  HENT:     Head: Normocephalic and atraumatic.     Mouth/Throat:     Mouth: Mucous membranes are moist.     Pharynx: Oropharynx is clear. Uvula midline. No pharyngeal swelling, oropharyngeal exudate or posterior oropharyngeal erythema.     Tonsils: No tonsillar exudate.  Eyes:     General:        Right eye: No discharge.        Left eye: No discharge.     Conjunctiva/sclera: Conjunctivae normal.     Pupils: Pupils are equal, round, and reactive to light.  Neck:     Trachea: Trachea and phonation normal. No tracheal deviation.  Cardiovascular:     Rate and Rhythm: Normal rate and regular rhythm.     Pulses: Normal pulses.          Radial pulses are 2+ on the right side and 2+ on the left side.       Dorsalis pedis pulses are 2+ on the right side and 2+ on the left side.     Heart sounds: Normal heart sounds. No murmur heard.   Pulmonary:     Effort: Pulmonary effort is normal. No respiratory distress.     Breath sounds: Normal breath sounds. No wheezing or rales.  Chest:      Chest wall: Tenderness present. No mass, lacerations, deformity, swelling or crepitus.    Abdominal:     General: Bowel sounds are normal. There is no distension.     Palpations: Abdomen is soft.     Tenderness: There is no abdominal tenderness. There is no right CVA tenderness, left CVA tenderness or rebound.  Musculoskeletal:        General: No deformity.  Cervical back: Neck supple. No crepitus. No pain with movement or spinous process tenderness.     Right lower leg: No tenderness. No edema.     Left lower leg: No tenderness. No edema.  Lymphadenopathy:     Cervical: No cervical adenopathy.  Skin:    General: Skin is warm and dry.     Capillary Refill: Capillary refill takes less than 2 seconds.  Neurological:     General: No focal deficit present.     Mental Status: She is alert and oriented to person, place, and time. Mental status is at baseline.  Psychiatric:        Mood and Affect: Mood normal.     ED Results / Procedures / Treatments   Labs (all labs ordered are listed, but only abnormal results are displayed) Labs Reviewed  BASIC METABOLIC PANEL - Abnormal; Notable for the following components:      Result Value   Glucose, Bld 122 (*)    All other components within normal limits  CBC  TROPONIN I (HIGH SENSITIVITY)    EKG EKG Interpretation  Date/Time:  Saturday August 24 2020 10:21:20 EST Ventricular Rate:  104 PR Interval:  156 QRS Duration: 84 QT Interval:  344 QTC Calculation: 452 R Axis:   29 Text Interpretation: Sinus tachycardia No sig change from SEpt 2021 ecg No STEMI Confirmed by Alvester Chou [phone]) on 08/24/2020 10:42:20 AM   Radiology DG Chest 2 View  Result Date: 08/24/2020 CLINICAL DATA:  Left anterior chest pain beginning last night. EXAM: CHEST - 2 VIEW COMPARISON:  05/26/2020 FINDINGS: The heart size and mediastinal contours are within normal limits. Both lungs are clear. No pleural effusion or pneumothorax. The visualized  skeletal structures are unremarkable. IMPRESSION: No active cardiopulmonary disease. Electronically Signed   By: Amie Portland M.D.   On: 08/24/2020 10:52    Procedures Procedures (including critical care time)  Medications Ordered in ED Medications  alum & mag hydroxide-simeth (MAALOX/MYLANTA) 200-200-20 MG/5ML suspension 30 mL (30 mLs Oral Given 08/24/20 1249)    And  lidocaine (XYLOCAINE) 2 % viscous mouth solution 15 mL (15 mLs Oral Given 08/24/20 1249)  famotidine (PEPCID) IVPB 20 mg premix (0 mg Intravenous Stopped 08/24/20 1324)  acetaminophen (TYLENOL) tablet 650 mg (650 mg Oral Given 08/24/20 1248)    ED Course  I have reviewed the triage vital signs and the nursing notes.  Pertinent labs & imaging results that were available during my care of the patient were reviewed by me and considered in my medical decision making (see chart for details).    MDM Rules/Calculators/A&P                         44 year old female with history of severe GERD and hiatal hernia who presents with concern for central left-sided chest pain that radiates to her left arm since 11:00 last night, as well as persistent belching since that time.  Differential diagnosis for the patient symptoms include but are not limited to ACS, arrhythmia, PE, dissection, GERD, pneumonia, gastritis, esophagitis.   Patient tachycardic to 109 on intake, hypertensive to 140/96.  Physical exam very reassuring.  Cardiopulmonary exam is normal, patient is no longer tachycardic at time of my exam, now with heart rate of 92 on the monitor.  Abdominal exam is benign.  Patient is belching throughout the exam.  Patient is tearful in the room explaining that she has been under more stress recently grieving the death of  her son during this holiday season.   We will proceed with [name] and basic laboratory studies.  Chest x-ray negative for acute cardiopulmonary disease.  EKG with sinus tachycardia, no change from prior, no  STEMI.  Patient is PERC negative. Patient maintained heart rate in the 90s, until discussing recent loss of her son, when her heart rate climbed to the 110- 120s.  Tachycardia resolved again after patient was no longer crying.  CBC unremarkable, BMP also unremarkable.  Troponin negative, less than 2.  No need for delta Trope at this time.  Patient was reevaluated shortly after administration of [name] and Pepcid.  She endorses total resolution of her chest pain and belching.  She states she is feeling much better and is very glad she came into the ED today.  Given reassuring physical exam, vital signs, chest x-ray, EKG, and laboratory studies, no further work-up is warranted in the emergency department at this time.  Recommend close follow-up with her GI doctor for further management of her GERD.  Tamika voiced understanding of her medical evaluation and treatment plan.  Each of her questions were answered to her expressed satisfaction.  Return precautions given.  Patient is well-appearing, stable, and is appropriate for discharge.  Final Clinical Impression(s) / ED Diagnoses Final diagnoses:  Gastroesophageal reflux disease, unspecified whether esophagitis present    Rx / DC Orders ED Discharge Orders    None       Sherrilee Gilles 08/24/20 1941    Terald Sleeper, MD 08/25/20 [phone]

## 2020-08-24 NOTE — ED Triage Notes (Signed)
Presents with chest pain onset last night approx 2300hrs,  radiating chest pain, radiates to left armpoints to area of left ant chest, describes pain as intermittent, sharp, pressure. Denies nausea and vomiting. States she was lying in bed when pain began

## 2020-08-24 NOTE — ED Notes (Signed)
ECG performed in triage

## 2020-08-24 NOTE — Discharge Instructions (Addendum)
You were evaluated in the emergency department today for your concern of chest pain since last night, as well as belching.  Your physical exam, vital signs, blood work, chest x-ray, and EKG are all very reassuring.  There is no sign of heart attack on your blood work or your EKG; there is no sign of infection on your blood work or your chest x-ray.  You were administered a [name], which is a combination of medications that can treat an exacerbation of GERD.  Your symptoms improved significantly after this medication, further reinforcing our suspicion that this was a exacerbation of your gastroesophageal reflux disease.   Please call to make an appointment to follow-up with your gastroenterologist.  You may use over-the-counter medication such as Maalox as needed for acute flares in addition to your prescription for Nexium.  Please return the emergency department if develop any new chest pain, difficulty breathing, nausea or vomiting that does not stop, or other new severe symptoms.

## 2020-09-03 ENCOUNTER — Ambulatory Visit: Payer: Medicaid Other

## 2020-09-03 NOTE — Chronic Care Management (AMB) (Signed)
RN  Care Management   Follow Up Note   09/03/2020 Name: Tamara Church MRN: [id number] DOB: 27-Aug-1976  Reason for referral : Chronic Care Management (HTN)   Tamara Church is a 44 y.o. year old female who is a primary care patient of Patrcia Dolly, DO. The care management team was consulted for assistance with care management and care coordination needs.      Assessment: Called the patient and she states that she is doing well.  She did have an issue with reflux and was seen in the ER on 08/24/20.  Patient Care Plan: RN Cased Manager  Problem Identified: Hypertension   Long-Range Goal: Hypertension Monitored   Start Date: 04/18/2020  Expected End Date: 11/18/2020  This Visit's Progress: On track  Priority: Medium  Note:    Current Barriers:  Marland Kitchen Knowledge Deficits related to basic understanding of hypertension pathophysiology and self care management  Nurse Case Manager Clinical Goal(s):  Marland Kitchen Over the next 30 days, patient will verbalize understanding of plan for hypertension management . Over the next 90 days, patient will demonstrate improved adherence to prescribed treatment plan for hypertension as evidenced by taking all medications as prescribed, monitoring and recording blood pressure as directed, adhering to low sodium/DASH diet  Interventions:  . Evaluation of current treatment plan related to hypertension self management and patient's adherence to plan as established by provider. . Provided education to patient re: stroke prevention, s/s of heart attack and stroke, DASH diet, complications of uncontrolled blood pressure . Reviewed medications with patient and discussed importance of compliance . Discussed plans with patient for ongoing care management follow up and provided patient with direct contact information for care management team . Advised patient, providing education and rationale, to monitor blood pressure daily and record, calling PCP for findings  outside established parameters.  . healthy diet promoted . reduction of dietary sodium encouraged . medication adherence assessment completed . support and encouragement provided . home or ambulatory blood pressure monitoring encouraged- patient states that her BP has been good.  Her BP was 130/80.  She has been checking every other day.  Discussed checking daily. . Patient states that she was seen in the ER on 08/24/20 for reflux.  She started having chest pain and pain in her left arm and she was unsure so she went to the er.  She states that they didd a lot of test and it was not her heart.  Advised her to monitor her diet and continue her medications.  Patient Goals/Self Care Activities:  . Self administers medications as prescribed . Attends all scheduled provider appointments . Calls provider office for new concerns, questions, or BP outside discussed parameters . Checks BP and records as discussed . Follows a low sodium diet/DASH diet . check blood pressure daily . choose a place to take my blood pressure (home, clinic or office, retail store) . write blood pressure results in a log or diary   Follow up Plan: The care management team will reach out to the patient again over the next 21 days.        Review of patient status, including review of consultants reports, relevant laboratory and other test results, and collaboration with appropriate care team members and the patient's provider was performed as part of comprehensive patient evaluation and provision of chronic care management services.    SDOH (Social Determinants of Health) assessments performed: No See Care Plan activities for detailed interventions related to [facility])  Lazaro Arms RN, BSN, [facility] Care Management Coordinator [place] Phone: [phone] Fax: [phone]

## 2020-09-03 NOTE — Patient Instructions (Signed)
. Visit Information  Goals Addressed            This Visit's Progress   . Track and Manage My Blood Pressure-Hypertension       Timeframe:  Long-Range Goal Priority:  Medium Start Date:   09/03/20                       Expected End Date:   10/22/19                    Follow Up Date 09/27/19   - check blood pressure daily - choose a place to take my blood pressure (home, clinic or office, retail store) - write blood pressure results in a log or diary    Why is this important?    You won't feel high blood pressure, but it can still hurt your blood vessels.   High blood pressure can cause heart or kidney problems. It can also cause a stroke.   Making lifestyle changes like losing a little weight or eating less salt will help.   Checking your blood pressure at home and at different times of the day can help to control blood pressure.   If the doctor prescribes medicine remember to take it the way the doctor ordered.   Call the office if you cannot afford the medicine or if there are questions about it.          Patient Care Plan: RN Cased Manager  Problem Identified: Hypertension   Long-Range Goal: Hypertension Monitored   Start Date: 04/18/2020  Expected End Date: 11/18/2020  This Visit's Progress: On track  Priority: Medium  Note:    Current Barriers:  Marland Kitchen Knowledge Deficits related to basic understanding of hypertension pathophysiology and self care management  Nurse Case Manager Clinical Goal(s):  Marland Kitchen Over the next 30 days, patient will verbalize understanding of plan for hypertension management . Over the next 90 days, patient will demonstrate improved adherence to prescribed treatment plan for hypertension as evidenced by taking all medications as prescribed, monitoring and recording blood pressure as directed, adhering to low sodium/DASH diet  Interventions:  . Evaluation of current treatment plan related to hypertension self management and patient's adherence to plan  as established by provider. . Provided education to patient re: stroke prevention, s/s of heart attack and stroke, DASH diet, complications of uncontrolled blood pressure . Reviewed medications with patient and discussed importance of compliance . Discussed plans with patient for ongoing care management follow up and provided patient with direct contact information for care management team . Advised patient, providing education and rationale, to monitor blood pressure daily and record, calling PCP for findings outside established parameters.  . healthy diet promoted . reduction of dietary sodium encouraged . medication adherence assessment completed . support and encouragement provided . home or ambulatory blood pressure monitoring encouraged- patient states that her BP has been good.  Her BP was 130/80.  She has been checking every other day.  Discussed checking daily. . Patient states that she was seen in the ER on 08/24/20 for reflux.  She started having chest pain and pain in her left arm and she was unsure so she went to the er.  She states that they didd a lot of test and it was not her heart.  Advised her to monitor her diet and continue her medications.  Patient Goals/Self Care Activities:  . Self administers medications as prescribed . Attends all scheduled provider appointments .  Calls provider office for new concerns, questions, or BP outside discussed parameters . Checks BP and records as discussed . Follows a low sodium diet/DASH diet . check blood pressure daily . choose a place to take my blood pressure (home, clinic or office, retail store) . write blood pressure results in a log or diary   Follow up Plan: The care management team will reach out to the patient again over the next 21 days.        Tamara Church was given information about Care Management services today including:  1. Care Management services include personalized support from designated clinical staff  supervised by her physician, including individualized plan of care and coordination with other care providers 2. 24/7 contact phone numbers for assistance for urgent and routine care needs. 3. The patient may stop CCM services at any time (effective at the end of the month) by phone call to the office staff.  Patient agreed to services and verbal consent obtained.   The patient verbalized understanding of instructions, educational materials, and care plan provided today and declined offer to receive copy of patient instructions, educational materials, and care plan.   Tamara Fairly RN, BSN, [facility] Care Management Coordinator [facility] Phone: [phone] I Fax: [phone]

## 2020-09-18 ENCOUNTER — Encounter: Payer: Self-pay | Admitting: Family Medicine

## 2020-09-18 ENCOUNTER — Other Ambulatory Visit: Payer: Self-pay

## 2020-09-18 ENCOUNTER — Telehealth (INDEPENDENT_AMBULATORY_CARE_PROVIDER_SITE_OTHER): Payer: Medicaid Other | Admitting: Family Medicine

## 2020-09-18 DIAGNOSIS — J029 Acute pharyngitis, unspecified: Secondary | ICD-10-CM | POA: Diagnosis not present

## 2020-09-18 HISTORY — DX: Acute pharyngitis, unspecified: J02.9

## 2020-09-18 NOTE — Assessment & Plan Note (Signed)
Patient with symtpoms concerning for covid-19. Patient reports sore throat and headache.  Denies chest pain and shortness of breath.  -Instructed patient to call to schedule COVID test by texting "COVID" to [id number] OR log onto [email or web address] Patient can also call [phone].  -Counseled on wearing a mask, washing hands and avoiding social gatherings  -ED precautions discussed and patient expressed good understanding -Patient instructed to avoid others until they meet criteria for ending isolation after any suspected COVID, which are:  -24 hours with no fever (without use of medicaitons) and -respiratory symptoms have improved (e.g. cough, shortness of breath) or  -10 days since symptoms first appeared - advised if covid negative and symptoms last longer than 1 week to return to care - discussed using honey or tylenol for sore throat

## 2020-09-18 NOTE — Progress Notes (Signed)
 [facility]icine Visit  Patient consented to have virtual visit and was identified by name and date of birth. Method of visit: Video was attempted, but technology challenges prevented patient from using video, so visit was conducted via telephone.  Encounter participants: Patient: Tamara Church - located at home Provider: Unknown Jim - located at home Others (if applicable): none  Chief Complaint: sore throat, headache, nausea  HPI:  Tamara Church is a 44 y.o. female who presents to discuss:   Sore throat this AM headache, sore throat, nausea Feels like she has to vomit No known sick contacts Has staying in herself, went to church on Sunday No fevers Was in her normal state of health until 0330 today Has tried water, HCTZ but no improvement Has a BP cuff but it doesn't work Sore throat is bothering her the most No cough, congestion Had some water, but it made her sick to her stomach She is not vaccinated for COVID No CP or SOB  ROS: per HPI  Pertinent PMHx: HTN, GERD, CKD 2  Exam:  There were no vitals taken for this visit.  Respiratory: Speaking in complete sentences, no evidence of respiratory distress over the phone  Assessment/Plan:  Sore throat Patient with symtpoms concerning for covid-19. Patient reports sore throat and headache.  Denies chest pain and shortness of breath.  -Instructed patient to call to schedule COVID test by texting "COVID" to [phone] OR log onto [email or web address] Patient can also call [phone].  -Counseled on wearing a mask, washing hands and avoiding social gatherings  -ED precautions discussed and patient expressed good understanding -Patient instructed to avoid others until they meet criteria for ending isolation after any suspected COVID, which are:  -24 hours with no fever (without use of medicaitons) and -respiratory symptoms have improved (e.g. cough, shortness of breath) or  -10  days since symptoms first appeared - advised if covid negative and symptoms last longer than 1 week to return to care - discussed using honey or tylenol for sore throat    Time spent during visit with patient: 11 minutes

## 2020-09-19 ENCOUNTER — Ambulatory Visit (HOSPITAL_COMMUNITY)
Admission: EM | Admit: 2020-09-19 | Discharge: 2020-09-19 | Disposition: A | Discharge status: Home / Self Care | Payer: Medicaid Other | Attending: Family Medicine | Admitting: Family Medicine

## 2020-09-19 ENCOUNTER — Other Ambulatory Visit: Payer: Self-pay

## 2020-09-19 ENCOUNTER — Telehealth: Payer: Medicaid Other | Admitting: Family Medicine

## 2020-09-19 ENCOUNTER — Encounter (HOSPITAL_COMMUNITY): Payer: Self-pay | Admitting: Emergency Medicine

## 2020-09-19 DIAGNOSIS — R9431 Abnormal electrocardiogram [ECG] [EKG]: Secondary | ICD-10-CM | POA: Diagnosis not present

## 2020-09-19 DIAGNOSIS — R000 Tachycardia, unspecified: Secondary | ICD-10-CM | POA: Insufficient documentation

## 2020-09-19 DIAGNOSIS — B349 Viral infection, unspecified: Secondary | ICD-10-CM

## 2020-09-19 DIAGNOSIS — I10 Essential (primary) hypertension: Secondary | ICD-10-CM | POA: Insufficient documentation

## 2020-09-19 DIAGNOSIS — U071 COVID-19: Secondary | ICD-10-CM | POA: Insufficient documentation

## 2020-09-19 LAB — POCT RAPID STREP A, ED / UC: Streptococcus, Group A Screen (Direct): NEGATIVE

## 2020-09-19 LAB — RESP PANEL BY RT-PCR (FLU A&B, COVID) ARPGX2
Influenza A by PCR: NEGATIVE
Influenza B by PCR: NEGATIVE
SARS Coronavirus 2 by RT PCR: POSITIVE — AB

## 2020-09-19 MED ORDER — PROMETHAZINE-DM 6.25-15 MG/5ML PO SYRP: 5.0000 mL | ORAL_SOLUTION | Freq: Every evening | ORAL | 0 refills | Status: DC | PRN

## 2020-09-19 MED ORDER — BENZONATATE 100 MG PO CAPS
100.0000 mg | ORAL_CAPSULE | Freq: Three times a day (TID) | ORAL | 0 refills | Status: DC | PRN
Start: 2020-09-19 — End: 2020-10-04

## 2020-09-19 NOTE — ED Provider Notes (Signed)
[facility]   MRN: [id number] DOB: 11/11/1975  Subjective:   Tamara Church is a 44 y.o. female presenting for 3-day history of acute onset malaise, fatigue, body aches, sinus congestion, throat pain, intermittent mild shortness of breath.  Patient is not vaccinated against the flu or COVID-19.  Denies a history of heart conditions but does have a history of palpitation and elevated pulse.  She has never seen a heart doctor.  She does have a history of high blood pressure and is on medication for this.  Denies smoking history, history of asthma or breathing disorders.  No current facility-administered medications for this encounter.  Current Outpatient Medications:  .  amLODipine (NORVASC) 10 MG tablet, Take 1 tablet (10 mg total) by mouth at bedtime., Disp: 90 tablet, Rfl: 3 .  esomeprazole (NEXIUM) 40 MG capsule, Take 1 capsule (40 mg total) by mouth 2 (two) times daily before a meal., Disp: 60 capsule, Rfl: 6 .  levonorgestrel (MIRENA) 20 MCG/24HR IUD, 1 each by Intrauterine route once., Disp: , Rfl:    Allergies  Allergen Reactions  . Ace Inhibitors Cough  . Angiotensin Receptor Blockers     cough  . Hctz [Hydrochlorothiazide]     hypokalemia    Past Medical History:  Diagnosis Date  . Anemia   . GERD (gastroesophageal reflux disease)   . Hypertension      Past Surgical History:  Procedure Laterality Date  . NO PAST SURGERIES      Family History  Problem Relation Age of Onset  . Hypertension Mother   . Colon cancer Neg Hx   . Esophageal cancer Neg Hx   . Rectal cancer Neg Hx   . Stomach cancer Neg Hx   . Inflammatory bowel disease Neg Hx   . Liver disease Neg Hx   . Pancreatic cancer Neg Hx     Social History   Tobacco Use  . Smoking status: Never Smoker  . Smokeless tobacco: Never Used  Vaping Use  . Vaping Use: Never used  Substance Use Topics  . Alcohol use: No  . Drug use: No    ROS   Objective:   Vitals: BP (!) 151/95  (BP Location: Right Arm)   Pulse (!) 120   Temp 98.6 F (37 C) (Oral)   Resp 20   Ht 5\' 5"  (1.651 m)   Wt 187 lb (84.8 kg)   LMP 09/14/2020   SpO2 98%   BMI 31.12 kg/m   Physical Exam Constitutional:      General: She is not in acute distress.    Appearance: Normal appearance. She is well-developed. She is not ill-appearing, toxic-appearing or diaphoretic.  HENT:     Head: Normocephalic and atraumatic.     Nose: Nose normal.     Mouth/Throat:     Mouth: Mucous membranes are moist.  Eyes:     Extraocular Movements: Extraocular movements intact.     Pupils: Pupils are equal, round, and reactive to light.  Cardiovascular:     Rate and Rhythm: Regular rhythm. Tachycardia present.     Pulses: Normal pulses.     Heart sounds: Normal heart sounds. No murmur heard. No friction rub. No gallop.   Pulmonary:     Effort: Pulmonary effort is normal. No respiratory distress.     Breath sounds: Normal breath sounds. No stridor. No wheezing, rhonchi or rales.  Skin:    General: Skin is warm and dry.     Findings:  No rash.  Neurological:     Mental Status: She is alert and oriented to person, place, and time.  Psychiatric:        Mood and Affect: Mood normal.        Behavior: Behavior normal.        Thought Content: Thought content normal.     Results for orders placed or performed during the hospital encounter of 09/19/20 (from the past 24 hour(s))  POCT Rapid Strep A     Status: None   Collection Time: 09/19/20  9:13 AM  Result Value Ref Range   Streptococcus, Group A Screen (Direct) NEGATIVE NEGATIVE    ED ECG REPORT   Date: 09/19/2020  Rate: 121bpm  Rhythm: sinus tachycardia  QRS Axis: normal  Intervals: QT prolonged  ST/T Wave abnormalities: nonspecific T wave changes and nonspecific ST/T changes  Conduction Disutrbances:none  Narrative Interpretation: Sinus tachycardia 121 bpm.  QTC prolonged, nonspecific T wave flattening in leads V1 through V6, T wave inversion in  III and aVF.  Slightly changed from previous EKG.  Old EKG Reviewed: changes noted  I have personally reviewed the EKG tracing and agree with the computerized printout as noted.   Assessment and Plan :   PDMP not reviewed this encounter.  1. Viral syndrome   2. Sinus tachycardia   3. Nonspecific abnormal electrocardiogram (ECG) (EKG)     EKG reviewed with Dr. Delton See including her heart risk factors.  At this time and despite changes with the EKG they are comparable.  Suspect that her tachycardia is related to COVID-19, viral syndrome.  Recommended supportive care.  Strict ER precautions. Counseled patient on potential for adverse effects with medications prescribed/recommended today, ER and return-to-clinic precautions discussed, patient verbalized understanding.    Wallis Bamberg, [place] 09/19/20 [phone]

## 2020-09-19 NOTE — Discharge Instructions (Addendum)

## 2020-09-19 NOTE — ED Triage Notes (Signed)
Patient c/o headache, sore throat, SOB, and generalized body aches x 3 days.   Patient denies fever at home.   Patient endorses nasal congestion. Patient endorses difficulty swallowing.   Patient has taken Tylenol w/ no relief of symptoms.

## 2020-09-21 LAB — CULTURE, GROUP A STREP (THRC)

## 2020-09-26 ENCOUNTER — Ambulatory Visit: Payer: Medicaid Other

## 2020-09-26 NOTE — Patient Instructions (Signed)
Tamara Church  it was nice speaking with you. Please call me directly [phone] if you have questions about the goals we discussed.  Goals Addressed            This Visit's Progress   . Track and Manage My Blood Pressure-Hypertension       Timeframe:  Long-Range Goal Priority:  Medium Start Date:   09/03/20                       Expected End Date:11/18/20                    - check blood pressure daily - choose a place to take my blood pressure (home, clinic or office, retail store) - write blood pressure results in a log or diary    Why is this important?    You won't feel high blood pressure, but it can still hurt your blood vessels.   High blood pressure can cause heart or kidney problems. It can also cause a stroke.   Making lifestyle changes like losing a little weight or eating less salt will help.   Checking your blood pressure at home and at different times of the day can help to control blood pressure.   If the doctor prescribes medicine remember to take it the way the doctor ordered.   Call the office if you cannot afford the medicine or if there are questions about it.           Patient Care Plan: RN Cased Manager  Problem Identified: Hypertension   Long-Range Goal: Hypertension Monitored   Start Date: 04/18/2020  Expected End Date: 11/18/2020  Recent Progress: On track  Priority: Medium  Note:    Current Barriers:  Marland Kitchen Knowledge Deficits related to basic understanding of hypertension pathophysiology and self care management  Nurse Case Manager Clinical Goal(s):  Marland Kitchen Over the next 30 days, patient will verbalize understanding of plan for hypertension management . Over the next 90 days, patient will demonstrate improved adherence to prescribed treatment plan for hypertension as evidenced by taking all medications as prescribed, monitoring and recording blood pressure as directed, adhering to low sodium/DASH diet  Interventions:  . Evaluation of current  treatment plan related to hypertension self management and patient's adherence to plan as established by provider. . Provided education to patient re: stroke prevention, s/s of heart attack and stroke, DASH diet, complications of uncontrolled blood pressure . Reviewed medications with patient and discussed importance of compliance . Discussed plans with patient for ongoing care management follow up and provided patient with direct contact information for care management team . Advised patient, providing education and rationale, to monitor blood pressure daily and record, calling PCP for findings outside established parameters.  . healthy diet promoted . reduction of dietary sodium encouraged . medication adherence assessment completed . support and encouragement provided . home or ambulatory blood pressure monitoring encouraged- patient states that she has not checked her BP.   The patient was seen in urgent care and had a viral infection.  She did not have covid.  She was able to eat and drink but did not have any energy.  She states that she is feeling a little better now.  She is sleeping good and taking all of her medications .  No problems with her stomach. . Advised her to stay hydrated and continue to take her medications   Patient Goals/Self Care Activities:  . Self administers medications  as prescribed . Attends all scheduled provider appointments . Calls provider office for new concerns, questions, or BP outside discussed parameters . Checks BP and records as discussed . Follows a low sodium diet/DASH diet . check blood pressure daily . choose a place to take my blood pressure (home, clinic or office, retail store) . write blood pressure results in a log or diary   Follow up Plan: The care management team will reach out to the patient again over the next 21 days.        [name] received Care Management services today:  1. Care Management services include personalized  support from designated clinical staff supervised by her physician, including individualized plan of care and coordination with other care providers 2. 24/7 contact [phone] for assistance for urgent and routine care needs. 3. Care Management are voluntary services and be declined at any time by calling the office.  The patient verbalized understanding of instructions provided today and declined a print copy of patient instruction materials.    [name], RN

## 2020-09-26 NOTE — Chronic Care Management (AMB) (Signed)
Care Management   RN Case Manager Follow Up Note  09/26/2020 Name: Tamara Church MRN: [id number] DOB: 1976-08-16 Tamara Church is a 45 y.o. year old female who sees Fayette Pho, MD for primary care.  Patient is enrolled in a Managed Medicaid plan: Yes.  The Care Management team was consulted by Yes to assist the patient with . Disease Management and Educational Needs.   RNCM engaged with Tamara Church today Engaged with patient by telephone  in response to provider referral for RN case management and/or care coordination services. See care plan below for details during this encounter.  Follow up Plan: The care management team will reach out to the patient again over the next 21 days.    Advanced Directives Status:Not addressed in this encounter.     SDOH (Social Determinants of Health) assessments performed: No     Patient Care Plan: RN Cased Manager  Problem Identified: Hypertension   Long-Range Goal: Hypertension Monitored   Start Date: 04/18/2020  Expected End Date: 11/18/2020  Recent Progress: On track  Priority: Medium   Current Barriers:  Marland Kitchen Knowledge Deficits related to basic understanding of hypertension pathophysiology and self care management  Nurse Case Manager Clinical Goal(s):  Marland Kitchen Over the next 30 days, patient will verbalize understanding of plan for hypertension management . Over the next 90 days, patient will demonstrate improved adherence to prescribed treatment plan for hypertension as evidenced by taking all medications as prescribed, monitoring and recording blood pressure as directed, adhering to low sodium/DASH diet  Interventions:  . Evaluation of current treatment plan related to hypertension self management and patient's adherence to plan as established by provider. . Provided education to patient re: stroke prevention, s/s of heart attack and stroke, DASH diet, complications of uncontrolled blood pressure . Reviewed medications with patient and  discussed importance of compliance . Discussed plans with patient for ongoing care management follow up and provided patient with direct contact information for care management team . Advised patient, providing education and rationale, to monitor blood pressure daily and record, calling PCP for findings outside established parameters.  . healthy diet promoted . reduction of dietary sodium encouraged . medication adherence assessment completed . support and encouragement provided . home or ambulatory blood pressure monitoring encouraged- patient states that she has not checked her BP.   The patient was seen in urgent care and had a viral infection.  She did not have covid.  She was able to eat and drink but did not have any energy.  She states that she is feeling a little better now.  She is sleeping good and taking all of her medications .  No problems with her stomach. . Advised her to stay hydrated and continue to take her medications   Patient Goals/Self Care Activities:  . Self administers medications as prescribed . Attends all scheduled provider appointments . Calls provider office for new concerns, questions, or BP outside discussed parameters . Checks BP and records as discussed . Follows a low sodium diet/DASH diet . check blood pressure daily . choose a place to take my blood pressure (home, clinic or office, retail store) . write blood pressure results in a log or diary        Outpatient Encounter Medications as of 09/26/2020  Medication Sig  . amLODipine (NORVASC) 10 MG tablet Take 1 tablet (10 mg total) by mouth at bedtime.  . benzonatate (TESSALON) 100 MG capsule Take 1-2 capsules (100-200 mg total) by mouth 3 (three)  times daily as needed.  Marland Kitchen esomeprazole (NEXIUM) 40 MG capsule Take 1 capsule (40 mg total) by mouth 2 (two) times daily before a meal.  . levonorgestrel (MIRENA) 20 MCG/24HR IUD 1 each by Intrauterine route once.  . promethazine-dextromethorphan  (PROMETHAZINE-DM) 6.25-15 MG/5ML syrup Take 5 mLs by mouth at bedtime as needed for cough.   No facility-administered encounter medications on file as of 09/26/2020.    Review of patient status, including review of consultants reports, relevant laboratory and other test results, and collaboration with appropriate care team members and the patient's provider was performed as part of comprehensive patient evaluation and provision of chronic care management services.       Information about Care Management services was shared with Ms.  Cullin today including:  1. Care Management services include personalized support from designated clinical staff supervised by her physician, including individualized plan of care and coordination with other care providers 2. Remind patient of 24/7 contact phone numbers to provider's office for assistance with urgent and routine care needs. 3. Care Management services are voluntary and patient may stop at any time .   Patient agreed to services provided today and verbal consent obtained.

## 2020-10-04 ENCOUNTER — Ambulatory Visit (INDEPENDENT_AMBULATORY_CARE_PROVIDER_SITE_OTHER): Payer: Medicaid Other

## 2020-10-04 ENCOUNTER — Encounter: Payer: Self-pay | Admitting: Internal Medicine

## 2020-10-04 ENCOUNTER — Encounter: Payer: Self-pay | Admitting: Radiology

## 2020-10-04 ENCOUNTER — Other Ambulatory Visit: Payer: Self-pay

## 2020-10-04 ENCOUNTER — Ambulatory Visit: Payer: Medicaid Other | Admitting: Internal Medicine

## 2020-10-04 VITALS — BP 126/80 | HR 92 | Ht 64.0 in | Wt 190.0 lb

## 2020-10-04 DIAGNOSIS — R9431 Abnormal electrocardiogram [ECG] [EKG]: Secondary | ICD-10-CM | POA: Insufficient documentation

## 2020-10-04 DIAGNOSIS — I471 Supraventricular tachycardia: Secondary | ICD-10-CM | POA: Diagnosis not present

## 2020-10-04 HISTORY — DX: Abnormal electrocardiogram (ECG) (EKG): R94.31

## 2020-10-04 NOTE — Progress Notes (Signed)
Enrolled patient for a 14 day Zio XT Monitor to be mailed to patients home  

## 2020-10-04 NOTE — Progress Notes (Signed)
Cardiology Office Note:    Date:  10/04/2020   ID:  Tamara Church, DOB [birth year], MRN [id number]  PCP:  [place] PhoLynn, Catherine, MD  [facility] Cardiologist:  No primary care provider on file.  [facility] Electrophysiologist:  None   CC: Tachycardia Consulted for the evaluation of tachycardia at the behest of Fayette PhoLynn, Catherine, MD  History of Present Illness:    Tamara Church is a 45 y.o. female with a hx of HTN, anemia, with recent viral prodrome who presents for evaluation.  Patient notes that she is feeling fine.  Had an URI 09/19/20- wasn't sure if it was COVID or Flu.  In urgent care had tachycardia at prolonged QTc.  On No QTc prolonging medication.  No prior history of syncope:  None with stress, sleep or seizures with sleep, or with loud noises.  Going through stress not without physical sequale.  Has had no chest pain, chest pressure, chest tightness, chest stinging.  Patient exertion notable for taking care of her grandchildren with  no symptoms.  No shortness of breath, DOE .  No PND or orthopnea.  No bendopnea, weight gain, leg swelling , or abdominal swelling.  No syncope or near syncope.  Notes  no palpitations or funny heart beats.   No hearing difficulties.  Patient reports NO prior cardiac testing including  echo,  stress test,  heart catheterizations,  cardioversion,  ablations.   Past Medical History:  Diagnosis Date  . Anemia   . GERD (gastroesophageal reflux disease)   . Hypertension     Past Surgical History:  Procedure Laterality Date  . NO PAST SURGERIES      Current Medications: Current Meds  Medication Sig  . amLODipine (NORVASC) 10 MG tablet Take 1 tablet (10 mg total) by mouth at bedtime.  Marland Kitchen. esomeprazole (NEXIUM) 40 MG capsule Take 1 capsule (40 mg total) by mouth 2 (two) times daily before a meal.  . levonorgestrel (MIRENA) 20 MCG/24HR IUD 1 each by Intrauterine route once.     Allergies:   Ace inhibitors, Angiotensin receptor blockers, and  Hctz [hydrochlorothiazide]   Social History   Socioeconomic History  . Marital status: Single    Spouse name: Not on file  . Number of children: Not on file  . Years of education: Not on file  . Highest education level: Not on file  Occupational History  . Not on file  Tobacco Use  . Smoking status: Never Smoker  . Smokeless tobacco: Never Used  Vaping Use  . Vaping Use: Never used  Substance and Sexual Activity  . Alcohol use: No  . Drug use: No  . Sexual activity: Yes    Birth control/protection: None, I.U.D.    Comment: pt would like to start depo provera today  Other Topics Concern  . Not on file  Social History Narrative  . Not on file   Social Determinants of Health   Financial Resource Strain: Not on file  Food Insecurity: Not on file  Transportation Needs: Not on file  Physical Activity: Not on file  Stress: Not on file  Social Connections: Not on file    Son Died in Police [place] January 2021  Family History: The patient's family history includes Hypertension in her mother. There is no history of Colon cancer, Esophageal cancer, Rectal cancer, Stomach cancer, Inflammatory bowel disease, Liver disease, or Pancreatic cancer. History of coronary artery disease notable for no members. History of heart failure notable for no members. No history of  cardiomyopathies including hypertrophic cardiomyopathy, left ventricular non-compaction, or arrhythmogenic right ventricular cardiomyopathy. History of arrhythmia notable for no members. Denies family history of sudden cardiac death including drowning,  or unexplained deaths in the family.  Son Died in [place] Accident, but was being chased by police.  No syncope in family. No history of bicuspid aortic valve or aortic aneurysm or dissection.  ROS:   Please see the history of present illness.     All other systems reviewed and are negative.  EKGs/Labs/Other Studies Reviewed:    The following studies were reviewed  today:  EKG:   10/04/20:  Sinus rhythm QTc 460 09/19/20: Sinus Tachycardia rate 124 QTC 600  Recent Labs: 05/16/2020: TSH 3.08 07/04/2020: ALT 18 08/24/2020: BUN 9; Creatinine, Ser 0.84; Hemoglobin 12.6; Platelets 302; Potassium 3.6; Sodium 137  Recent Lipid Panel    Component Value Date/Time   CHOL 115 09/04/2008 2002   TRIG 47 09/04/2008 2002   HDL 44 09/04/2008 2002   CHOLHDL 2.6 Ratio 09/04/2008 2002   VLDL 9 09/04/2008 2002   LDLCALC 62 09/04/2008 2002   Risk Assessment/Calculations:     LQTS Score of 2  Physical Exam:    VS:  BP 126/80   Pulse 92   Ht 5\' 4"  (1.626 m)   Wt 190 lb (86.2 kg)   LMP 09/14/2020   BMI 32.61 kg/m     Wt Readings from Last 3 Encounters:  10/04/20 190 lb (86.2 kg)  09/19/20 187 lb (84.8 kg)  08/24/20 193 lb 12.8 oz (87.9 kg)    GEN:  Well nourished, well developed in no acute distress HEENT: Normal NECK: No JVD; No carotid bruits LYMPHATICS: No lymphadenopathy CARDIAC: RRR, no murmurs, rubs, gallops RESPIRATORY:  Clear to auscultation without rales, wheezing or rhonchi  ABDOMEN: Soft, non-tender, non-distended MUSCULOSKELETAL:  No edema; No deformity  SKIN: Warm and dry NEUROLOGIC:  Alert and oriented x 3 PSYCHIATRIC:  Normal affect   ASSESSMENT:    1. SVT (supraventricular tachycardia) (HCC)   2. Prolonged QT interval    PLAN:    In order of problems listed above:  Prolonged QTc - on no medications - without Twave notching or T wave alternans  - without Torsades - without low resting heart rate  - without stress related syncope  - without congenital deafness - without family hx of LQTS or SCD - LQTS score 2 - will get ZioPatch to assess for Torsades and repeat EKG at next visit - if any of the above changes will send to EP and consider nadolol  2-3 follow up unless new symptoms or abnormal test results warranting change in plan  Would be reasonable for  Virtual Follow up Would be reasonable for  APP Follow  up  Medication Adjustments/Labs and Tests Ordered: Current medicines are reviewed at length with the patient today.  Concerns regarding medicines are outlined above.  Orders Placed This Encounter  Procedures  . LONG TERM MONITOR (3-14 DAYS)  . EKG 12-Lead   No orders of the defined types were placed in this encounter.   Patient Instructions  Medication Instructions:  Your physician recommends that you continue on your current medications as directed. Please refer to the Current Medication list given to you today.  *If you need a refill on your cardiac medications before your next appointment, please call your pharmacy*   Lab Work: None ordered  If you have labs (blood work) drawn today and your tests are completely normal, you will receive your results  only by: Marland Kitchen MyChart Message (if you have MyChart) OR . A paper copy in the mail If you have any lab test that is abnormal or we need to change your treatment, we will call you to review the results.   Testing/Procedures: Christena Deem- Long Term Monitor Instructions   Your physician has requested you wear your ZIO patch monitor___14__days.   This is a single patch monitor.  [employer] supplies one patch monitor per enrollment.  Additional stickers are not available.   Please do not apply patch if you will be having a Nuclear Stress Test, Echocardiogram, Cardiac CT, MRI, or Chest Xray during the time frame you would be wearing the monitor. The patch cannot be worn during these tests.  You cannot remove and re-apply the ZIO XT patch monitor.   Your ZIO patch monitor will be sent USPS Priority mail from [facility] directly to your home address. The monitor may also be mailed to a PO BOX if home delivery is not available.   It may take 3-5 days to receive your monitor after you have been enrolled.   Once you have received you monitor, please review enclosed instructions.  Your monitor has already been registered assigning a specific  monitor serial # to you.   Applying the monitor   Shave hair from upper left chest.   Hold abrader disc by orange tab.  Rub abrader in 40 strokes over left upper chest as indicated in your monitor instructions.   Clean area with 4 enclosed alcohol pads .  Use all pads to assure are is cleaned thoroughly.  Let dry.   Apply patch as indicated in monitor instructions.  Patch will be place under collarbone on left side of chest with arrow pointing upward.   Rub patch adhesive wings for 2 minutes.Remove white label marked "1".  Remove white label marked "2".  Rub patch adhesive wings for 2 additional minutes.   While looking in a mirror, press and release button in center of patch.  A small green light will flash 3-4 times .  This will be your only indicator the monitor has been turned on.     Do not shower for the first 24 hours.  You may shower after the first 24 hours.   Press button if you feel a symptom. You will hear a small click.  Record Date, Time and Symptom in the Patient Log Book.   When you are ready to remove patch, follow instructions on last 2 pages of Patient Log Book.  Stick patch monitor onto last page of Patient Log Book.   Place Patient Log Book in [place] box.  Use locking tab on box and tape box closed securely.  The Orange and [employer] has [employer] on it.  Please place in mailbox as soon as possible.  Your physician should have your test results approximately 7 days after the monitor has been mailed back to [facility].   Call [facility] Customer Care at [phone] if you have questions regarding your ZIO XT patch monitor.  Call them immediately if you see an orange light blinking on your monitor.   If your monitor falls off in less than 4 days contact our Monitor department at [phone].  If your monitor becomes loose or falls off after 4 days call [employer] at [phone] for suggestions on securing your monitor.     Follow-Up: At [facility],  you and your health needs are our priority.  As part of our continuing  mission to provide you with exceptional heart care, we have created designated Provider Care Teams.  These Care Teams include your primary Cardiologist (physician) and Advanced Practice Providers (APPs -  Physician Assistants and Nurse Practitioners) who all work together to provide you with the care you need, when you need it.  We recommend signing up for the patient portal called "MyChart".  Sign up information is provided on this After Visit Summary.  MyChart is used to connect with patients for Virtual Visits (Telemedicine).  Patients are able to view lab/test results, encounter notes, upcoming appointments, etc.  Non-urgent messages can be sent to your provider as well.   To learn more about what you can do with MyChart, go to [email or web address].    Your next appointment:   2 month(s)  The format for your next appointment:   In Person  Provider:   Riley Lam, MD   Other Instructions      Signed, Christell Constant, MD  10/04/2020 9:05 AM    [facility]

## 2020-10-04 NOTE — Patient Instructions (Signed)
Medication Instructions:  Your physician recommends that you continue on your current medications as directed. Please refer to the Current Medication list given to you today.  *If you need a refill on your cardiac medications before your next appointment, please call your pharmacy*   Lab Work: None ordered  If you have labs (blood work) drawn today and your tests are completely normal, you will receive your results only by: Marland Kitchen MyChart Message (if you have MyChart) OR . A paper copy in the mail If you have any lab test that is abnormal or we need to change your treatment, we will call you to review the results.   Testing/Procedures: Christena Deem- Long Term Monitor Instructions   Your physician has requested you wear your ZIO patch monitor___14__days.   This is a single patch monitor.  [employer] supplies one patch monitor per enrollment.  Additional stickers are not available.   Please do not apply patch if you will be having a Nuclear Stress Test, Echocardiogram, Cardiac CT, MRI, or Chest Xray during the time frame you would be wearing the monitor. The patch cannot be worn during these tests.  You cannot remove and re-apply the ZIO XT patch monitor.   Your ZIO patch monitor will be sent USPS Priority mail from [facility] directly to your home address. The monitor may also be mailed to a PO BOX if home delivery is not available.   It may take 3-5 days to receive your monitor after you have been enrolled.   Once you have received you monitor, please review enclosed instructions.  Your monitor has already been registered assigning a specific monitor serial # to you.   Applying the monitor   Shave hair from upper left chest.   Hold abrader disc by orange tab.  Rub abrader in 40 strokes over left upper chest as indicated in your monitor instructions.   Clean area with 4 enclosed alcohol pads .  Use all pads to assure are is cleaned thoroughly.  Let dry.   Apply patch as indicated in  monitor instructions.  Patch will be place under collarbone on left side of chest with arrow pointing upward.   Rub patch adhesive wings for 2 minutes.Remove white label marked "1".  Remove white label marked "2".  Rub patch adhesive wings for 2 additional minutes.   While looking in a mirror, press and release button in center of patch.  A small green light will flash 3-4 times .  This will be your only indicator the monitor has been turned on.     Do not shower for the first 24 hours.  You may shower after the first 24 hours.   Press button if you feel a symptom. You will hear a small click.  Record Date, Time and Symptom in the Patient Log Book.   When you are ready to remove patch, follow instructions on last 2 pages of Patient Log Book.  Stick patch monitor onto last page of Patient Log Book.   Place Patient Log Book in [place] box.  Use locking tab on box and tape box closed securely.  The [employer] and [employer] has [employer] on it.  Please place in mailbox as soon as possible.  Your physician should have your test results approximately 7 days after the monitor has been mailed back to [facility].   Call [facility] Customer Care at [phone] if you have questions regarding your ZIO XT patch monitor.  Call them immediately if you see  an orange light blinking on your monitor.   If your monitor falls off in less than 4 days contact our Monitor department at [phone].  If your monitor becomes loose or falls off after 4 days call [employer] at [phone] for suggestions on securing your monitor.     Follow-Up: At [facility], you and your health needs are our priority.  As part of our continuing mission to provide you with exceptional heart care, we have created designated Provider Care Teams.  These Care Teams include your primary Cardiologist (physician) and Advanced Practice Providers (APPs -  Physician Assistants and Nurse Practitioners) who all work together to provide  you with the care you need, when you need it.  We recommend signing up for the patient portal called "MyChart".  Sign up information is provided on this After Visit Summary.  MyChart is used to connect with patients for Virtual Visits (Telemedicine).  Patients are able to view lab/test results, encounter notes, upcoming appointments, etc.  Non-urgent messages can be sent to your provider as well.   To learn more about what you can do with MyChart, go to [email or web address].    Your next appointment:   2 month(s)  The format for your next appointment:   In Person  Provider:   Riley Lam, MD   Other Instructions

## 2020-10-11 ENCOUNTER — Ambulatory Visit: Payer: Medicaid Other

## 2020-10-11 NOTE — Patient Instructions (Addendum)
Visit Information  Tamara Church was given information about Medicaid Managed Care team care coordination services as a part of their [facility] Community [facility] Medicaid benefit. Tamara Church did not consent to engagement with the [facility] Managed Care team.   For questions related to your [facility], please call: [phone] or visit the homepage here: [email or web address]  If you would like to schedule transportation through your [facility], please call the following number at least 2 days in advance of your appointment: [phone]  Tamara Church - following are the goals we discussed in your visit today:   Goals Addressed            This Visit's Progress    Track and Manage My Blood Pressure-Hypertension   On track    Timeframe:  Long-Range Goal Priority:  Medium Start Date:   09/03/20                       Expected End Date:01/17/21                   - check blood pressure daily - choose a place to take my blood pressure (home, clinic or office, retail store) - write blood pressure results in a log or diary    Why is this important?    You won't feel high blood pressure, but it can still hurt your blood vessels.   High blood pressure can cause heart or kidney problems. It can also cause a stroke.   Making lifestyle changes like losing a little weight or eating less salt will help.   Checking your blood pressure at home and at different times of the day can help to control blood pressure.   If the doctor prescribes medicine remember to take it the way the doctor ordered.   Call the office if you cannot afford the medicine or if there are questions about it.           Please see education materials related to HTN  The patient verbalized understanding of instructions provided today and declined a print copy of patient instruction materials.     Follow up Plan:  Patient would like continued follow-up.  CCM RNCM  will outreach the patient within the next 21 days. Patient will call office if needed prior to next encounter    [name], RN  Following is a copy of your plan of care:  Patient Care Plan: RN Cased Manager    Problem Identified: Hypertension     Long-Range Goal: Hypertension Monitored   Start Date: 04/18/2020  Expected End Date: 01/17/2021  Recent Progress: On track  Priority: Medium  Note:    Current Barriers:   Knowledge Deficits related to basic understanding of hypertension pathophysiology and self care management  Nurse Case Manager Clinical Goal(s):   Over the next 30 days, patient will verbalize understanding of plan for hypertension management  Over the next 90 days, patient will demonstrate improved adherence to prescribed treatment plan for hypertension as evidenced by taking all medications as prescribed, monitoring and recording blood pressure as directed, adhering to low sodium/DASH diet  Interventions:   Evaluation of current treatment plan related to hypertension self management and patient's adherence to plan as established by provider.  Provided education to patient re: stroke prevention, s/s of heart attack and stroke, DASH diet, complications of uncontrolled blood pressure  Reviewed medications with patient and discussed importance of compliance  Discussed plans with patient for ongoing care management follow up and provided patient with direct contact information for care management team  Advised patient, providing education and rationale, to monitor blood pressure daily and record, calling PCP for findings outside established parameters.   healthy diet promoted  reduction of dietary sodium encouraged  medication adherence assessment completed  support and encouragement provided  home or ambulatory blood pressure monitoring encouraged- Patient has improved and been more consistent with checking her  blood pressures.  Range is : 126/80 to 135/92  Patient denies any headaches, chest pain or flushing.   On 10/04/20 patient was Church at the cardiologist office and is currently wearing a heart monitor until February  2022 visit.   Patient will follow up in March 2022 with the gastroenterologist. She reports she is taking her Nexium 1 time a day as opposed to 2 times per day as prescribed.     Patient Goals/Self Care Activities:   Self administers medications as prescribed  Attends all scheduled provider appointments  Calls provider office for new concerns, questions, or BP outside discussed parameters  Checks BP and records as discussed  Follows a low sodium diet/DASH diet  check blood pressure daily  choose a place to take my blood pressure (home, clinic or office, retail store)  write blood pressure results in a log or diary        How to Take Your Blood Pressure Blood pressure measures how strongly your blood is pressing against the walls of your arteries. Arteries are blood vessels that carry blood from your heart throughout your body. You can take your blood pressure at home with a machine. You may need to check your blood pressure at home:  To check if you have high blood pressure (hypertension).  To check your blood pressure over time.  To make sure your blood pressure medicine is working. Supplies needed:  Blood pressure machine, or monitor.  Dining room chair to sit in.  Table or desk.  Small notebook.  Pencil or pen. How to prepare Avoid these things for 30 minutes before checking your blood pressure:  Having drinks with caffeine in them, such as coffee or tea.  Drinking alcohol.  Eating.  Smoking.  Exercising. Do these things five minutes before checking your blood pressure:  Go to the bathroom and pee (urinate).  Sit in a dining chair. Do not sit in a soft couch or an armchair.  Be quiet. Do not talk. How to take your blood  pressure Follow the instructions that came with your machine. If you have a digital blood pressure monitor, these may be the instructions: 1. Sit up straight. 2. Place your feet on the floor. Do not cross your ankles or legs. 3. Rest your left arm at the level of your heart. You may rest it on a table, desk, or chair. 4. Pull up your shirt sleeve. 5. Wrap the blood pressure cuff around the upper part of your left arm. The cuff should be 1 inch (2.5 cm) above your elbow. It is best to wrap the cuff around bare skin. 6. Fit the cuff snugly around your arm. You should be able to place only one finger between the cuff and your arm. 7. Place the cord so that it rests in the bend of your elbow. 8. Press the power button. 9. Sit quietly while the cuff fills with air and loses air. 10. Write down the numbers on the screen. 11. Wait 2-3 minutes and then  repeat steps 1-10.   What do the numbers mean? Two numbers make up your blood pressure. The first number is called systolic pressure. The second is called diastolic pressure. An example of a blood pressure reading is "120 over 80" (or 120/80). If you are an adult and do not have a medical condition, use this guide to find out if your blood pressure is normal: Normal  First number: below 120.  Second number: below 80. Elevated  First number: 120-129.  Second number: below 80. Hypertension stage 1  First number: 130-139.  Second number: 80-89. Hypertension stage 2  First number: 140 or above.  Second number: 90 or above. Your blood pressure is above normal even if only the top or bottom number is above normal. Follow these instructions at home:  Check your blood pressure as often as your doctor tells you to.  Check your blood pressure at the same time every day.  Take your monitor to your next doctor's appointment. Your doctor will: ? Make sure you are using it correctly. ? Make sure it is working right.  Make sure you understand  what your blood pressure numbers should be.  Tell your doctor if your medicine is causing side effects.  Keep all follow-up visits as told by your doctor. This is important. General tips:  You will need a blood pressure machine, or monitor. Your doctor can suggest a monitor. You can buy one at a drugstore or online. When choosing one: ? Choose one with an arm cuff. ? Choose one that wraps around your upper arm. Only one finger should fit between your arm and the cuff. ? Do not choose one that measures your blood pressure from your wrist or finger. Where to find more information American Heart Association: www.heart.org Contact a doctor if:  Your blood pressure keeps being high. Get help right away if:  Your first blood pressure number is higher than 180.  Your second blood pressure number is higher than 120. Summary  Check your blood pressure at the same time every day.  Avoid caffeine, alcohol, smoking, and exercise for 30 minutes before checking your blood pressure.  Make sure you understand what your blood pressure numbers should be. This information is not intended to replace advice given to you by your health care provider. Make sure you discuss any questions you have with your health care provider. Document Revised: 09/01/2019 Document Reviewed: 09/01/2019 Elsevier Patient Education  2021 [employer].

## 2020-10-11 NOTE — Chronic Care Management (AMB) (Signed)
Care Management    RN Visit Note  10/11/2020 Name: Tamara Church MRN: [id number] DOB: 06-01-1976  Subjective: Tamara Church is a 45 y.o. year old female who is a primary care patient of Fayette Pho, MD. The care management team was consulted for assistance with disease management and care coordination needs.    Engaged with patient by telephone for follow up visit in response to provider referral for case management and/or care coordination services.   Consent to Services:   Tamara Church was given information about Care Management services today including:  1. Care Management services includes personalized support from designated clinical staff supervised by her physician, including individualized plan of care and coordination with other care providers 2. 24/7 contact phone numbers for assistance for urgent and routine care needs. 3. The patient may stop case management services at any time by phone call to the office staff.  Patient agreed to services and consent obtained.   Assessment: Review of patient past medical history, allergies, medications, health status, including review of consultants reports, laboratory and other test data, was performed as part of comprehensive evaluation and provision of chronic care management services.   SDOH (Social Determinants of Health) assessments and interventions performed:    Care Plan  Allergies  Allergen Reactions  . Ace Inhibitors Cough  . Angiotensin Receptor Blockers     cough  . Hctz [Hydrochlorothiazide]     hypokalemia    Outpatient Encounter Medications as of 10/11/2020  Medication Sig  . amLODipine (NORVASC) 10 MG tablet Take 1 tablet (10 mg total) by mouth at bedtime.  Marland Kitchen levonorgestrel (MIRENA) 20 MCG/24HR IUD 1 each by Intrauterine route once.  Marland Kitchen esomeprazole (NEXIUM) 40 MG capsule Take 1 capsule (40 mg total) by mouth 2 (two) times daily before a meal.   No facility-administered encounter medications on file as of  10/11/2020.    Patient Active Problem List   Diagnosis Date Noted  . Prolonged QT interval 10/04/2020  . Sore throat 09/18/2020  . Gastritis and gastroduodenitis 07/27/2020  . Abdominal pain, epigastric 07/27/2020  . Unintentional weight loss 05/18/2020  . Early satiety 05/18/2020  . Non-intractable vomiting 05/18/2020  . Constipation 05/18/2020  . Bacterial vaginosis 05/08/2020  . UTI (urinary tract infection) 04/23/2020  . Palpitations 03/22/2020  . Adjustment disorder with depressed mood 03/15/2020  . Seasonal allergies 02/08/2020  . Decreased hearing 12/15/2019  . CKD (chronic kidney disease) stage 2, GFR 60-89 ml/min 03/21/2012  . ANEMIA 09/07/2008  . GERD 09/04/2008  . OBESITY, NOS 11/18/2006  . HYPERTENSION, BENIGN SYSTEMIC 11/18/2006    Conditions to be addressed/monitored: HTN  Care Plan : RN Cased Manager  Updates made by [name], RN since 10/11/2020 12:00 AM  Problem: Hypertension   Long-Range Goal: Hypertension Monitored   Start Date: 04/18/2020  Expected End Date: 01/17/2021  Recent Progress: On track  Priority: Medium  Note:    Current Barriers:  Marland Kitchen Knowledge Deficits related to basic understanding of hypertension pathophysiology and self care management  Nurse Case Manager Clinical Goal(s):  Marland Kitchen Over the next 30 days, patient will verbalize understanding of plan for hypertension management . Over the next 90 days, patient will demonstrate improved adherence to prescribed treatment plan for hypertension as evidenced by taking all medications as prescribed, monitoring and recording blood pressure as directed, adhering to low sodium/DASH diet  Interventions:  . Evaluation of current treatment plan related to hypertension self management and patient's adherence to plan as established by provider. Marland Kitchen  Provided education to patient re: stroke prevention, s/s of heart attack and stroke, DASH diet, complications of uncontrolled blood pressure . Reviewed medications  with patient and discussed importance of compliance . Discussed plans with patient for ongoing care management follow up and provided patient with direct contact information for care management team . Advised patient, providing education and rationale, to monitor blood pressure daily and record, calling PCP for findings outside established parameters.  . healthy diet promoted . reduction of dietary sodium encouraged . medication adherence assessment completed . support and encouragement provided . home or ambulatory blood pressure monitoring encouraged- Patient has improved and been more consistent with checking her blood pressures.  Range is : 126/80 to 135/92 . Patient denies any headaches, chest pain or flushing.  . On 10/04/20 patient was seen at the cardiologist office and is currently wearing a heart monitor until February  2022 visit.  . Patient will follow up in March 2022 with the gastroenterologist. She reports she is taking her Nexium 1 time a day as opposed to 2 times per day as prescribed.  .   Patient Goals/Self Care Activities:  . Self administers medications as prescribed . Attends all scheduled provider appointments . Calls provider office for new concerns, questions, or BP outside discussed parameters . Checks BP and records as discussed . Follows a low sodium diet/DASH diet . check blood pressure daily . choose a place to take my blood pressure (home, clinic or office, retail store) . write blood pressure results in a log or diary        [name] RN, BSN, [facility] Care Management Coordinator [facility] Phone: [phone] I Fax: [phone]

## 2020-11-07 ENCOUNTER — Ambulatory Visit: Payer: Medicaid Other

## 2020-11-07 NOTE — Patient Instructions (Signed)
Visit Information  Tamara Church  it was nice speaking with you. Please call me directly [phone] if you have questions about the goals we discussed.  Goals Addressed            This Visit's Progress   . Track and Manage My Blood Pressure-Hypertension       Timeframe:  Long-Range Goal Priority:  Medium Start Date:   09/03/20                       Expected End Date:02/18/21                   - check blood pressure daily - choose a place to take my blood pressure (home, clinic or office, retail store) - write blood pressure results in a log or diary    Why is this important?    You won't feel high blood pressure, but it can still hurt your blood vessels.   High blood pressure can cause heart or kidney problems. It can also cause a stroke.   Making lifestyle changes like losing a little weight or eating less salt will help.   Checking your blood pressure at home and at different times of the day can help to control blood pressure.   If the doctor prescribes medicine remember to take it the way the doctor ordered.   Call the office if you cannot afford the medicine or if there are questions about it.           The patient verbalized understanding of instructions, educational materials, and care plan provided today and declined offer to receive copy of patient instructions, educational materials, and care plan.   Follow up Plan: Patient would like continued follow-up.  CCM RNCM will outreach the patient within 30 days.. Patient will call office if needed prior to next encounter  [name], RN

## 2020-11-07 NOTE — Chronic Care Management (AMB) (Signed)
Care Management    RN Visit Note  11/07/2020 Name: Tamara Church MRN: [id number] DOB: Jul 27, 1976  Subjective: Tamara Church is a 45 y.o. year old female who is a primary care patient of Fayette Pho, MD. The care management team was consulted for assistance with disease management and care coordination needs.    Engaged with patient by telephone for follow up visit in response to provider referral for case management and/or care coordination services.   Consent to Services:   Tamara Church was given information about Care Management services today including:  1. Care Management services includes personalized support from designated clinical staff supervised by her physician, including individualized plan of care and coordination with other care providers 2. 24/7 contact phone numbers for assistance for urgent and routine care needs. 3. The patient may stop case management services at any time by phone call to the office staff.  Patient agreed to services and consent obtained.    Assessment: Patient is making progress with checking her blood pressure on a regular basis. . See Care Plan below for interventions and patient self-care actives. Follow up Plan: Patient would like continued follow-up.  CCM RNCM  will outreach the patient within 30 days.. Patient will call office if needed prior to next encounter : Review of patient past medical history, allergies, medications, health status, including review of consultants reports, laboratory and other test data, was performed as part of comprehensive evaluation and provision of chronic care management services.   SDOH (Social Determinants of Health) assessments and interventions performed:    Care Plan  Allergies  Allergen Reactions   Ace Inhibitors Cough   Angiotensin Receptor Blockers     cough   Hctz [Hydrochlorothiazide]     hypokalemia    Outpatient Encounter Medications as of 11/07/2020  Medication Sig   amLODipine  (NORVASC) 10 MG tablet Take 1 tablet (10 mg total) by mouth at bedtime.   esomeprazole (NEXIUM) 40 MG capsule Take 1 capsule (40 mg total) by mouth 2 (two) times daily before a meal.   levonorgestrel (MIRENA) 20 MCG/24HR IUD 1 each by Intrauterine route once.   No facility-administered encounter medications on file as of 11/07/2020.    Patient Active Problem List   Diagnosis Date Noted   Prolonged QT interval 10/04/2020   Sore throat 09/18/2020   Gastritis and gastroduodenitis 07/27/2020   Abdominal pain, epigastric 07/27/2020   Unintentional weight loss 05/18/2020   Early satiety 05/18/2020   Non-intractable vomiting 05/18/2020   Constipation 05/18/2020   Bacterial vaginosis 05/08/2020   UTI (urinary tract infection) 04/23/2020   Palpitations 03/22/2020   Adjustment disorder with depressed mood 03/15/2020   Seasonal allergies 02/08/2020   Decreased hearing 12/15/2019   CKD (chronic kidney disease) stage 2, GFR 60-89 ml/min 03/21/2012   ANEMIA 09/07/2008   GERD 09/04/2008   OBESITY, NOS 11/18/2006   HYPERTENSION, BENIGN SYSTEMIC 11/18/2006    Conditions to be addressed/monitored: HTN  Care Plan : RN Cased Manager  Updates made by [name], RN since 11/07/2020 12:00 AM  Problem: Hypertension   Long-Range Goal: Hypertension Monitored   Start Date: 04/18/2020  Expected End Date: 02/18/2021  Recent Progress: On track  Priority: Medium  Note:    Current Barriers:   Knowledge Deficits related to basic understanding of hypertension pathophysiology and self care management  Nurse Case Manager Clinical Goal(s):   Over the next 30 days, patient will verbalize understanding of plan for hypertension management  Over the next 90 days,  patient will demonstrate improved adherence to prescribed treatment plan for hypertension as evidenced by taking all medications as prescribed, monitoring and recording blood pressure as directed, adhering to low sodium/DASH  diet  Interventions:   Evaluation of current treatment plan related to hypertension self management and patient's adherence to plan as established by provider.  Provided education to patient re: stroke prevention, s/s of heart attack and stroke, DASH diet, complications of uncontrolled blood pressure  Reviewed medications with patient and discussed importance of compliance  Discussed plans with patient for ongoing care management follow up and provided patient with direct contact information for care management team  Advised patient, providing education and rationale, to monitor blood pressure daily and record, calling PCP for findings outside established parameters.   healthy diet promoted  reduction of dietary sodium encouraged  medication adherence assessment completed  support and encouragement provided  home or ambulatory blood pressure monitoring encouraged- Patient has improved and been more consistent with checking her blood pressures.  Range is : 128/75 to 130/80  Patient denies any headaches, chest pain or flushing.   She reports that she is monitoring her diet and is staying active with chores in the home and wtching the kids.  She is sleeping well.  Patient Goals/Self Care Activities:   Self administers medications as prescribed  Attends all scheduled provider appointments  Calls provider office for new concerns, questions, or BP outside discussed parameters  Checks BP and records as discussed  Follows a low sodium diet/DASH diet  check blood pressure daily  choose a place to take my blood pressure (home, clinic or office, retail store)  write blood pressure results in a log or diary          [name], RN

## 2020-11-26 ENCOUNTER — Encounter: Payer: Self-pay | Admitting: Internal Medicine

## 2020-11-26 ENCOUNTER — Ambulatory Visit: Payer: Medicaid Other | Admitting: Internal Medicine

## 2020-11-26 ENCOUNTER — Ambulatory Visit (INDEPENDENT_AMBULATORY_CARE_PROVIDER_SITE_OTHER): Payer: Medicaid Other | Admitting: Internal Medicine

## 2020-11-26 ENCOUNTER — Other Ambulatory Visit: Payer: Self-pay

## 2020-11-26 VITALS — BP 122/84 | HR 85 | Ht 62.0 in | Wt 197.0 lb

## 2020-11-26 DIAGNOSIS — R9431 Abnormal electrocardiogram [ECG] [EKG]: Secondary | ICD-10-CM | POA: Diagnosis not present

## 2020-11-26 NOTE — Patient Instructions (Signed)
Medication Instructions:  Your physician recommends that you continue on your current medications as directed. Please refer to the Current Medication list given to you today.  *If you need a refill on your cardiac medications before your next appointment, please call your pharmacy*   Lab Work: TODAY: BMP If you have labs (blood work) drawn today and your tests are completely normal, you will receive your results only by: Marland Kitchen MyChart Message (if you have MyChart) OR . A paper copy in the mail If you have any lab test that is abnormal or we need to change your treatment, we will call you to review the results.   Testing/Procedures: NONE   Follow-Up: At [facility], you and your health needs are our priority.  As part of our continuing mission to provide you with exceptional heart care, we have created designated Provider Care Teams.  These Care Teams include your primary Cardiologist (physician) and Advanced Practice Providers (APPs -  Physician Assistants and Nurse Practitioners) who all work together to provide you with the care you need, when you need it.  We recommend signing up for the patient portal called "MyChart".  Sign up information is provided on this After Visit Summary.  MyChart is used to connect with patients for Virtual Visits (Telemedicine).  Patients are able to view lab/test results, encounter notes, upcoming appointments, etc.  Non-urgent messages can be sent to your provider as well.   To learn more about what you can do with MyChart, go to [email or web address].    Your next appointment:   12 month(s)  The format for your next appointment:   In Person  Provider:   Dr. Izora Ribas

## 2020-11-26 NOTE — Progress Notes (Signed)
Cardiology Office Note:    Date:  11/26/2020   ID:  [name], DOB [birth year], MRN [id number]  PCP:  Fayette Pho, MD  [facility] Cardiologist:  Riley Lam MD [facility] Electrophysiologist:  None   CC: Tachycardia f/u  History of Present Illness:    Tamara Church is a 45 y.o. female with a hx of HTN, anemia, with recent viral prodrome who presents for evaluation 10/04/20 with a 08/3020 QTc of 600. In interim of this visit, patient had heart monitor WNL and normal ECGs.  Patient notes that she is doing.  Since ast visit notes no changes.  Relevant interval testing or therapy include heart monitor.  There are no interval hospital/ED visit.    No chest pain or pressure .  No SOB/DOE and no PND/Orthopnea.  No weight gain or leg swelling.  No palpitations or syncope .  Ambulatory blood pressure SBP 125.   Past Medical History:  Diagnosis Date   Anemia    GERD (gastroesophageal reflux disease)    Hypertension     Past Surgical History:  Procedure Laterality Date   NO PAST SURGERIES      Current Medications: Current Meds  Medication Sig   amLODipine (NORVASC) 10 MG tablet Take 1 tablet (10 mg total) by mouth at bedtime.   esomeprazole (NEXIUM) 40 MG capsule Take 1 capsule (40 mg total) by mouth 2 (two) times daily before a meal.   levonorgestrel (MIRENA) 20 MCG/24HR IUD 1 each by Intrauterine route once.     Allergies:   Ace inhibitors, Angiotensin receptor blockers, and Hctz [hydrochlorothiazide]   Social History   Socioeconomic History   Marital status: Single    Spouse name: Not on file   Number of children: Not on file   Years of education: Not on file   Highest education level: Not on file  Occupational History   Not on file  Tobacco Use   Smoking status: Never Smoker   Smokeless tobacco: Never Used  Vaping Use   Vaping Use: Never used  Substance and Sexual Activity   Alcohol use: No   Drug use: No   Sexual  activity: Yes    Birth control/protection: None, I.U.D.    Comment: pt would like to start depo provera today  Other Topics Concern   Not on file  Social History Narrative   Not on file   Social Determinants of Health   Financial Resource Strain: Not on file  Food Insecurity: Not on file  Transportation Needs: Not on file  Physical Activity: Not on file  Stress: Not on file  Social Connections: Not on file    Son Died in Police Chase 2019/10/02 Family History: The patient's family history includes Hypertension in her mother. There is no history of Colon cancer, Esophageal cancer, Rectal cancer, Stomach cancer, Inflammatory bowel disease, Liver disease, or Pancreatic cancer. History of coronary artery disease notable for no members. History of heart failure notable for no members. No history of cardiomyopathies including hypertrophic cardiomyopathy, left ventricular non-compaction, or arrhythmogenic right ventricular cardiomyopathy. History of arrhythmia notable for no members. Denies family history of sudden cardiac death including drowning,  or unexplained deaths in the family.  Son Died in [place] Accident, but was being chased by police.  No syncope in family. No history of bicuspid aortic valve or aortic aneurysm or dissection.  ROS:   Please see the history of present illness.     All other systems reviewed and are  negative.  EKGs/Labs/Other Studies Reviewed:    The following studies were reviewed today:  EKG:   11/26/20 10/04/20:  Sinus rhythm QTc 460 09/19/20: Sinus Tachycardia rate 124 QTC 600  Cardiac Event Monitoring: Date: 11/12/20 Results:  Patient had a minimum heart rate of 60 bpm, maximum heart rate of 167 bpm, and average heart rate of 87 bpm.  Predominant underlying rhythm was sinus rhythm.  Isolated PACs were rare (<1.0%), with rare couplets.  Isolated PVCs were rare (<1.0%).  No evidence of complete heart block; though first degree heart block was  noted.  Triggered and diary events associated with sinus tachycardia.   No malignant arrhythmias.   Recent Labs: 05/16/2020: TSH 3.08 07/04/2020: ALT 18 08/24/2020: BUN 9; Creatinine, Ser 0.84; Hemoglobin 12.6; Platelets 302; Potassium 3.6; Sodium 137  Recent Lipid Panel    Component Value Date/Time   CHOL 115 09/04/2008 2002   TRIG 47 09/04/2008 2002   HDL 44 09/04/2008 2002   CHOLHDL 2.6 Ratio 09/04/2008 2002   VLDL 9 09/04/2008 2002   LDLCALC 62 09/04/2008 2002   Risk Assessment/Calculations:     LQTS Score of 2  Physical Exam:    VS:  BP 122/84    Pulse 85    Ht 5\' 2"  (1.575 m)    Wt 197 lb (89.4 kg)    BMI 36.03 kg/m     Wt Readings from Last 3 Encounters:  11/26/20 197 lb (89.4 kg)  10/04/20 190 lb (86.2 kg)  09/19/20 187 lb (84.8 kg)    GEN:  Well nourished, well developed in no acute distress HEENT: Normal NECK: No JVD; No carotid bruits LYMPHATICS: No lymphadenopathy CARDIAC: RRR, no murmurs, rubs, gallops RESPIRATORY:  Clear to auscultation without rales, wheezing or rhonchi  ABDOMEN: Soft, non-tender, non-distended MUSCULOSKELETAL:  No edema; No deformity  SKIN: Warm and dry NEUROLOGIC:  Alert and oriented x 3 PSYCHIATRIC:  Normal affect   ASSESSMENT:    1. Prolonged QT interval    PLAN:    In order of problems listed above:  Prolonged QTc - on no medications - without Twave notching or T wave alternans  - without Torsades - without low resting heart rate  - without stress related syncope  - without congenital deafness - without family hx of LQTS or SCD - LQTS score 2 - no Torsades and resolution of QTc in all but 09/19/20 visit - we discussed symptoms of concern including SOB, CP, Palpitations, near syncope or syncope - discussed medication interactions - low threshold for nadaolol start and EP referral - will get BMP  One year follow up unless new symptoms or abnormal test results warranting change in plan  Would be reasonable for   Video Visit Follow up Would be reasonable for  APP Follow up  Medication Adjustments/Labs and Tests Ordered: Current medicines are reviewed at length with the patient today.  Concerns regarding medicines are outlined above.  Orders Placed This Encounter  Procedures   Basic metabolic panel   EKG 12-Lead   No orders of the defined types were placed in this encounter.   Patient Instructions  Medication Instructions:  Your physician recommends that you continue on your current medications as directed. Please refer to the Current Medication list given to you today.  *If you need a refill on your cardiac medications before your next appointment, please call your pharmacy*   Lab Work: TODAY: BMP If you have labs (blood work) drawn today and your tests are completely normal, you will  receive your results only by:  MyChart Message (if you have MyChart) OR  A paper copy in the mail If you have any lab test that is abnormal or we need to change your treatment, we will call you to review the results.   Testing/Procedures: NONE   Follow-Up: At [facility], you and your health needs are our priority.  As part of our continuing mission to provide you with exceptional heart care, we have created designated Provider Care Teams.  These Care Teams include your primary Cardiologist (physician) and Advanced Practice Providers (APPs -  Physician Assistants and Nurse Practitioners) who all work together to provide you with the care you need, when you need it.  We recommend signing up for the patient portal called "MyChart".  Sign up information is provided on this After Visit Summary.  MyChart is used to connect with patients for Virtual Visits (Telemedicine).  Patients are able to view lab/test results, encounter notes, upcoming appointments, etc.  Non-urgent messages can be sent to your provider as well.   To learn more about what you can do with MyChart, go to [email or web address].     Your next appointment:   12 month(s)  The format for your next appointment:   In Person  Provider:   Dr. Izora Ribas         Signed, Christell Constant, MD  11/26/2020 10:23 AM    [facility]

## 2020-11-28 LAB — BASIC METABOLIC PANEL
BUN/Creatinine Ratio: 13 (ref 9–23)
BUN: 12 mg/dL (ref 6–24)
CO2: 17 mmol/L — ABNORMAL LOW (ref 20–29)
Calcium: 9.8 mg/dL (ref 8.7–10.2)
Chloride: 101 mmol/L (ref 96–106)
Creatinine, Ser: 0.92 mg/dL (ref 0.57–1.00)
Glucose: 96 mg/dL (ref 65–99)
Potassium: 3.6 mmol/L (ref 3.5–5.2)
Sodium: 141 mmol/L (ref 134–144)
eGFR: 79 mL/min/{1.73_m2} (ref >59)

## 2020-12-05 ENCOUNTER — Telehealth: Payer: Medicaid Other

## 2020-12-05 ENCOUNTER — Telehealth: Payer: Self-pay

## 2020-12-05 NOTE — Telephone Encounter (Signed)
  Care Management   Outreach Note  12/05/2020 Name: Tamara Church MRN: [id number] DOB: 01-18-1976  Referred by: Fayette Pho, MD Reason for referral : Chronic Care Management (HTN)   An unsuccessful telephone outreach was attempted today. The patient was referred to the case management team for assistance with care management and care coordination.   Follow Up Plan: A HIPAA compliant phone message was left for the patient providing contact information and requesting a return call.  The care management team will reach out to the patient again over the next 7-14 days.   [name] RN, BSN, [facility] Care Management Coordinator [facility] Phone: [phone] I Fax: [phone]

## 2020-12-13 ENCOUNTER — Telehealth: Payer: Self-pay | Admitting: *Deleted

## 2020-12-13 NOTE — Chronic Care Management (AMB) (Signed)
  Care Management   Note  12/13/2020 Name: Tamara Church MRN: [id number] DOB: [birth year]  Tamara Church is a 45 y.o. year old female who is a primary care patient of Fayette Pho, MD and is actively engaged with the care management team. I reached out to Tamara Church by phone today to assist with re-scheduling a follow up visit with the RN Case Manager  Follow up plan: Unsuccessful telephone outreach attempt made. A HIPAA compliant phone message was left for the patient providing contact information and requesting a return call.  The care management team will reach out to the patient again over the next 7 days.  If patient returns call to provider office, please advise to call Embedded Care Management Care Guide Gwenevere Ghazi at [phone]  [facility] Guide, Embedded Care Coordination [facility] Management

## 2020-12-19 NOTE — Chronic Care Management (AMB) (Signed)
  Care Management   Note  12/19/2020 Name: Tamara Church MRN: [id number] DOB: [birth year]  Tamara Church is a 45 y.o. year old female who is a primary care patient of Fayette Pho, MD and is actively engaged with the care management team. I reached out to Tamara Church by phone today to assist with re-scheduling a follow up visit with the RN Case Manager  Follow up plan: Telephone appointment with care management team member scheduled for:12/31/2020  [facility] Guide, Embedded Care Coordination [facility] Management

## 2020-12-19 NOTE — Telephone Encounter (Signed)
Rescheduled 12/31/2020

## 2020-12-31 ENCOUNTER — Telehealth: Payer: Medicaid Other

## 2020-12-31 ENCOUNTER — Telehealth: Payer: Self-pay

## 2020-12-31 NOTE — Telephone Encounter (Signed)
  Care Management   Outreach Note  12/31/2020 Name: Tamara Church MRN: [id number] DOB: [birth year]  Referred by: Fayette Pho, MD Reason for referral : Chronic Care Management (HTN/)   A second unsuccessful telephone outreach was attempted today. The patient was referred to the case management team for assistance with care management and care coordination.   Follow Up Plan: Telephone follow up appointment with care management team member scheduled for: 01/10/21 A HIPAA compliant phone message was left for the patient providing contact information and requesting a return call.   [name] RN, BSN, [facility] Care Management Coordinator [facility] Phone: [phone] I Fax: [phone]

## 2021-01-10 ENCOUNTER — Telehealth: Payer: Medicaid Other

## 2021-01-10 ENCOUNTER — Telehealth: Payer: Self-pay

## 2021-01-10 NOTE — Telephone Encounter (Signed)
   RN Case Manager Care Management   Phone Outreach    01/10/2021 Name: Tamara Church MRN: [id number] DOB: [birth year]  Tamara Church is a 45 y.o. year old female who is a primary care patient of Fayette Pho, MD .   3rd unsuccessful telephone outreach was attempted today. The patient was referred to the case management team for assistance with care management and care coordination. The patient's primary care provider has been notified of our unsuccessful attempts to make or maintain contact with the patient. The care management team is pleased to engage with this patient at any time in the future should he/she be interested in assistance from the care management team.   Plan:If no return call is received, in the next 14 days RNCM will disconnect from the chart.  Review of patient status, including review of consultants reports, relevant laboratory and other test results, and collaboration with appropriate care team members and the patient's provider was performed as part of comprehensive patient evaluation and provision of care management services.    [name] RN, BSN, [facility] Care Management Coordinator [facility] Phone: [phone] I Fax: [phone]

## 2021-01-20 ENCOUNTER — Ambulatory Visit: Payer: Medicaid Other

## 2021-01-20 NOTE — Progress Notes (Deleted)
    SUBJECTIVE:   CHIEF COMPLAINT / HPI:   Runny nose/scratchy throat: hx of seasonal allergies.  Refilled levoceterizine and azelastine nasal spray alst may.   PERTINENT  PMH / PSH: ***  OBJECTIVE:   There were no vitals taken for this visit.  ***  ASSESSMENT/PLAN:   No problem-specific Assessment & Plan notes found for this encounter.     Sandre Kitty, MD [facility]   {    This will disappear when note is signed, click to select method of visit    :1}

## 2021-04-29 ENCOUNTER — Other Ambulatory Visit: Payer: Self-pay | Admitting: Family Medicine

## 2021-04-29 ENCOUNTER — Ambulatory Visit (HOSPITAL_COMMUNITY)
Admission: EM | Admit: 2021-04-29 | Discharge: 2021-04-29 | Disposition: A | Discharge status: Home / Self Care | Payer: Medicaid Other | Attending: Family Medicine | Admitting: Family Medicine

## 2021-04-29 ENCOUNTER — Encounter (HOSPITAL_COMMUNITY): Payer: Self-pay | Admitting: Emergency Medicine

## 2021-04-29 DIAGNOSIS — J0100 Acute maxillary sinusitis, unspecified: Secondary | ICD-10-CM | POA: Diagnosis not present

## 2021-04-29 DIAGNOSIS — J3089 Other allergic rhinitis: Secondary | ICD-10-CM

## 2021-04-29 MED ORDER — CETIRIZINE HCL 10 MG PO TABS: 10.0000 mg | ORAL_TABLET | Freq: Every day | ORAL | 2 refills | Status: DC

## 2021-04-29 MED ORDER — FLUTICASONE PROPIONATE 50 MCG/ACT NA SUSP: 1.0000 | Freq: Two times a day (BID) | NASAL | 2 refills | Status: DC

## 2021-04-29 MED ORDER — AMOXICILLIN-POT CLAVULANATE 875-125 MG PO TABS: 1.0000 | ORAL_TABLET | Freq: Two times a day (BID) | ORAL | 0 refills | Status: DC

## 2021-04-29 NOTE — ED Provider Notes (Signed)
[facility]    CSN: [id number] Arrival date & time: 04/29/21  4158      History   Chief Complaint Chief Complaint  Patient presents with   Sore Throat   Cough   Nasal Congestion   Facial Pain    HPI Tamara Church is a 45 y.o. female.   In today with 10-day history of progressively worsening congestion, sinus pain and pressure, headache, scratchy throat, fatigue, chills.  States she thought she was getting better for a few days but then symptoms again worse in the last couple of days in her sinuses.  She denies chest pain, shortness of breath, known fevers, abdominal pain, nausea vomiting or diarrhea.  Has a history of seasonal allergies not currently consistently on an allergy regimen.  Taking over-the-counter cough and cold medications with minimal relief.  No known sick contacts.  Did not take any COVID testing since onset of symptoms.   Past Medical History:  Diagnosis Date   Anemia    GERD (gastroesophageal reflux disease)    Hypertension     Patient Active Problem List   Diagnosis Date Noted   Prolonged QT interval 10/04/2020   Sore throat 09/18/2020   Gastritis and gastroduodenitis 07/27/2020   Abdominal pain, epigastric 07/27/2020   Unintentional weight loss 05/18/2020   Early satiety 05/18/2020   Non-intractable vomiting 05/18/2020   Constipation 05/18/2020   Bacterial vaginosis 05/08/2020   UTI (urinary tract infection) 04/23/2020   Palpitations 03/22/2020   Adjustment disorder with depressed mood 03/15/2020   Seasonal allergies 02/08/2020   Decreased hearing 12/15/2019   CKD (chronic kidney disease) stage 2, GFR 60-89 ml/min 03/21/2012   ANEMIA 09/07/2008   GERD 09/04/2008   OBESITY, NOS 11/18/2006   HYPERTENSION, BENIGN SYSTEMIC 11/18/2006    Past Surgical History:  Procedure Laterality Date   NO PAST SURGERIES      OB History     Gravida  6   Para  6   Term  6   Preterm  0   AB  0   Living  6      SAB  0   IAB  0    Ectopic  0   Multiple  0   Live Births               Home Medications    Prior to Admission medications   Medication Sig Start Date End Date Taking? Authorizing Provider  amoxicillin-clavulanate (AUGMENTIN) 875-125 MG tablet Take 1 tablet by mouth every 12 (twelve) hours. 04/29/21  Yes Particia Nearing, PA-C  cetirizine (ZYRTEC ALLERGY) 10 MG tablet Take 1 tablet (10 mg total) by mouth daily. 04/29/21  Yes Particia Nearing, PA-C  fluticasone [facility]) 50 MCG/ACT nasal spray Place 1 spray into both nostrils 2 (two) times daily. 04/29/21  Yes Particia Nearing, PA-C  amLODipine (NORVASC) 10 MG tablet Take 1 tablet (10 mg total) by mouth at bedtime. 05/14/20   Fayette Pho, MD  esomeprazole (NEXIUM) 40 MG capsule Take 1 capsule (40 mg total) by mouth 2 (two) times daily before a meal. 07/26/20 08/25/20  Mansouraty, Netty Starring., MD  levonorgestrel (MIRENA) 20 MCG/24HR IUD 1 each by Intrauterine route once.    [provider]    Family History Family History  Problem Relation Age of Onset   Hypertension Mother    Colon cancer Neg Hx    Esophageal cancer Neg Hx    Rectal cancer Neg Hx    Stomach cancer Neg  Hx    Inflammatory bowel disease Neg Hx    Liver disease Neg Hx    Pancreatic cancer Neg Hx     Social History Social History   Tobacco Use   Smoking status: Never   Smokeless tobacco: Never  Vaping Use   Vaping Use: Never used  Substance Use Topics   Alcohol use: No   Drug use: No     Allergies   Ace inhibitors, Angiotensin receptor blockers, and Hctz [hydrochlorothiazide]   Review of Systems Review of Systems Per HPI  Physical Exam Triage Vital Signs ED Triage Vitals  Enc Vitals Group     BP 04/29/21 0827 (!) 147/118     Pulse Rate 04/29/21 0827 94     Resp 04/29/21 0827 17     Temp 04/29/21 0827 98.5 F (36.9 C)     Temp Source 04/29/21 0827 Oral     SpO2 04/29/21 0827 100 %     Weight --      Height --      Head  Circumference --      Peak Flow --      Pain Score 04/29/21 0824 3     Pain Loc --      Pain Edu? --      Excl. in GC? --    No data found.  Updated Vital Signs BP (!) 147/118 (BP Location: Right Arm)   Pulse 94   Temp 98.5 F (36.9 C) (Oral)   Resp 17   LMP 04/09/2021   SpO2 100%   Visual Acuity Right Eye Distance:   Left Eye Distance:   Bilateral Distance:    Right Eye Near:   Left Eye Near:    Bilateral Near:     Physical Exam Vitals and nursing note reviewed.  Constitutional:      Appearance: Normal appearance. She is not ill-appearing.  HENT:     Head: Atraumatic.     Right Ear: Tympanic membrane normal.     Left Ear: Tympanic membrane normal.     Nose: Congestion present.     Mouth/Throat:     Mouth: Mucous membranes are moist.     Pharynx: Posterior oropharyngeal erythema present. No oropharyngeal exudate.  Eyes:     Extraocular Movements: Extraocular movements intact.     Conjunctiva/sclera: Conjunctivae normal.  Cardiovascular:     Rate and Rhythm: Normal rate and regular rhythm.     Heart sounds: Normal heart sounds.  Pulmonary:     Effort: Pulmonary effort is normal.     Breath sounds: Normal breath sounds. No wheezing or rales.  Abdominal:     General: Bowel sounds are normal. There is no distension.     Palpations: Abdomen is soft.     Tenderness: There is no abdominal tenderness. There is no guarding.  Musculoskeletal:        General: Normal range of motion.     Cervical back: Normal range of motion and neck supple.  Skin:    General: Skin is warm and dry.  Neurological:     Mental Status: She is alert and oriented to person, place, and time.     Motor: No weakness.     Gait: Gait normal.  Psychiatric:        Mood and Affect: Mood normal.        Thought Content: Thought content normal.        Judgment: Judgment normal.     UC Treatments / Results  Labs (  all labs ordered are listed, but only abnormal results are displayed) Labs  Reviewed - No data to display  EKG   Radiology No results found.  Procedures Procedures (including critical care time)  Medications Ordered in UC Medications - No data to display  Initial Impression / Assessment and Plan / UC Course  I have reviewed the triage vital signs and the nursing notes.  Pertinent labs & imaging results that were available during my care of the patient were reviewed by me and considered in my medical decision making (see chart for details).     Given duration and worsening course, will treat with Augmentin, restart allergy regimen with Zyrtec and Flonase, Mucinex, sinus rinses as needed.  Work note given.  Return for acutely worsening symptoms.  Final Clinical Impressions(s) / UC Diagnoses   Final diagnoses:  Acute non-recurrent maxillary sinusitis  Seasonal allergic rhinitis due to other allergic trigger   Discharge Instructions   None    ED Prescriptions     Medication Sig Dispense Auth. Provider   amoxicillin-clavulanate (AUGMENTIN) 875-125 MG tablet Take 1 tablet by mouth every 12 (twelve) hours. 14 tablet Particia Nearing, [place]   cetirizine (ZYRTEC ALLERGY) 10 MG tablet Take 1 tablet (10 mg total) by mouth daily. 30 tablet Particia Nearing, PA-C   fluticasone [facility]) 50 MCG/ACT nasal spray Place 1 spray into both nostrils 2 (two) times daily. 16 g Particia Nearing, [place]      PDMP not reviewed this encounter.   Particia Nearing, [place] 04/29/21 1053

## 2021-04-29 NOTE — ED Triage Notes (Signed)
Pt presents with head congestion, productive cough, headache, sinus pain and sore throat xs 1 week.

## 2021-05-05 ENCOUNTER — Telehealth: Payer: Self-pay

## 2021-05-05 DIAGNOSIS — B3731 Acute candidiasis of vulva and vagina: Secondary | ICD-10-CM

## 2021-05-05 DIAGNOSIS — B373 Candidiasis of vulva and vagina: Secondary | ICD-10-CM

## 2021-05-05 MED ORDER — TIOCONAZOLE 6.5 % VA OINT: 1.0000 | TOPICAL_OINTMENT | Freq: Once | VAGINAL | 0 refills | Status: AC

## 2021-05-05 NOTE — Telephone Encounter (Signed)
Patient calls nurse line reporting she was seen in UC on 8/8 and given an antibiotic. Patient reports now she developed a yeast infection. Patient reports an odorless itchy discharge with some outside irritation. Patient advised she has not been seen in almost one year. Apt made with PCP for 8/30 for a physical. Please advise on treatment.

## 2021-05-05 NOTE — Telephone Encounter (Signed)
Normally, I would like to see the patient and obtain a vaginal swab prior to treatment. However, we are booked up and she has scheduled an appointment as soon as possible (albeit 2 weeks away).   Given her relative contraindication to oral fluconazole due to history of prolonged QT, I will send in a prescription for vaginal suppository of tioconazole 6.5% gel (vagistat-1) to be applied 4.6 g vaginally once.   We will follow up at her 8/30 appt and see if she has resolution of her symptoms. We may need further testing and different treatment if her symptoms have not resolved.   Fayette Pho, MD

## 2021-05-20 ENCOUNTER — Ambulatory Visit: Payer: Medicaid Other | Admitting: Family Medicine

## 2021-06-04 ENCOUNTER — Telehealth: Payer: Self-pay | Admitting: Gastroenterology

## 2021-06-04 MED ORDER — ESOMEPRAZOLE MAGNESIUM 40 MG PO CPDR: 40.0000 mg | DELAYED_RELEASE_CAPSULE | Freq: Two times a day (BID) | ORAL | 1 refills | Status: DC

## 2021-06-04 NOTE — Telephone Encounter (Signed)
Inbound call from pt stating that she has 2 pills left for the medication Esomeprazole 40 mg and is requesting a refill. She does has an appt to see Dr. Meridee Score in October for a med refill. Please advise. Thank you.

## 2021-06-04 NOTE — Telephone Encounter (Signed)
Refill for Esomeprazole 40mg  sent to [facility].

## 2021-07-03 ENCOUNTER — Ambulatory Visit (INDEPENDENT_AMBULATORY_CARE_PROVIDER_SITE_OTHER): Payer: Medicaid Other | Admitting: Gastroenterology

## 2021-07-03 ENCOUNTER — Encounter: Payer: Self-pay | Admitting: Gastroenterology

## 2021-07-03 VITALS — BP 148/100 | HR 80 | Ht 62.0 in | Wt 210.0 lb

## 2021-07-03 DIAGNOSIS — K8020 Calculus of gallbladder without cholecystitis without obstruction: Secondary | ICD-10-CM

## 2021-07-03 DIAGNOSIS — Z1211 Encounter for screening for malignant neoplasm of colon: Secondary | ICD-10-CM

## 2021-07-03 DIAGNOSIS — K2100 Gastro-esophageal reflux disease with esophagitis, without bleeding: Secondary | ICD-10-CM

## 2021-07-03 MED ORDER — ESOMEPRAZOLE MAGNESIUM 20 MG PO CPDR: 20.0000 mg | DELAYED_RELEASE_CAPSULE | Freq: Every day | ORAL | 1 refills | Status: DC

## 2021-07-03 NOTE — Patient Instructions (Addendum)
Decrease your Nexium to 20mg  - once daily.  . We have sent the following medications to your pharmacy for you to pick up at your convenience: Nexium   Send a my chart message in 1 month letting us know how your doing.   We will see you back in 1 year. Sooner if needed.   If you are age 44 or younger, your body mass index should be between 19-25. Your Body mass index is 38.41 kg/m. If this is out of the aformentioned range listed, please consider follow up with your Primary Care Provider.   __________________________________________________________  The [facility] providers would like to encourage you to use [facility] to communicate with providers for non-urgent requests or questions.  Due to long hold times on the telephone, sending your provider a message by [facility] may be a faster and more efficient way to get a response.  Please allow 48 business hours for a response.  Please remember that this is for non-urgent requests.     Thank you for choosing me and [facility].  Dr. [facility]P [facility]

## 2021-07-03 NOTE — Progress Notes (Signed)
GASTROENTEROLOGY OUTPATIENT CLINIC VISIT   Primary Care Provider Fayette Pho, MD [address] [place] [address] [phone]  Patient Profile: Tamara Church is a 45 y.o. female with a pmh significant for hypertension, anemia, cholelithiasis, hiatal hernia, GERD (microscopic esophagitis noted on biopsies).  The patient presents to the [facility] for an evaluation and management of problem(s) noted below:  Problem List 1. Gastroesophageal reflux disease with esophagitis without hemorrhage   2. Calculus of gallbladder without cholecystitis without obstruction   3. Colon cancer screening      History of Present Illness Please see prior progress notes for full details of HPI.  Interval History Today, the patient returns for scheduled follow-up.  She has done exceedingly well and is having no symptoms at this time as long as she is taking her PPI therapy.  She has been taking it once daily with good effect.  She wonders if she will ever be able to come off the medication completely.  She denies any dysphagia symptoms odynophagia symptoms.  No early satiety or bloating or nausea or vomiting her bowel habits remain normal not any blood in her stool.  She is very happy currently.  GI Review of Systems Positive as above Negative for bloating   Review of Systems General: Denies fevers/chills/unintentional weight loss Cardiovascular: Denies chest pain/palpitations Pulmonary: Denies shortness of breath Gastroenterological: See HPI Genitourinary: Denies darkened urine Hematological: Denies easy bruising/bleeding Dermatological: Denies jaundice Psychological: Mood is stable   Medications Current Outpatient Medications  Medication Sig Dispense Refill   amLODipine (NORVASC) 10 MG tablet TAKE 1 TABLET BY MOUTH AT BEDTIME 90 tablet 1   esomeprazole (NEXIUM) 20 MG capsule Take 1 capsule (20 mg total) by mouth daily at 12 noon. 30 capsule 1   levonorgestrel  (MIRENA) 20 MCG/24HR IUD 1 each by Intrauterine route once.     No current facility-administered medications for this visit.    Allergies Allergies  Allergen Reactions   Ace Inhibitors Cough   Angiotensin Receptor Blockers     cough   Hctz [Hydrochlorothiazide]     hypokalemia    Histories Past Medical History:  Diagnosis Date   Anemia    GERD (gastroesophageal reflux disease)    Hypertension    Past Surgical History:  Procedure Laterality Date   NO PAST SURGERIES     Social History   Socioeconomic History   Marital status: Single    Spouse name: Not on file   Number of children: Not on file   Years of education: Not on file   Highest education level: Not on file  Occupational History   Not on file  Tobacco Use   Smoking status: Never   Smokeless tobacco: Never  Vaping Use   Vaping Use: Never used  Substance and Sexual Activity   Alcohol use: No   Drug use: No   Sexual activity: Yes    Birth control/protection: None, I.U.D.    Comment: pt would like to start depo provera today  Other Topics Concern   Not on file  Social History Narrative   Not on file   Social Determinants of Health   Financial Resource Strain: Not on file  Food Insecurity: Not on file  Transportation Needs: Not on file  Physical Activity: Not on file  Stress: Not on file  Social Connections: Not on file  Intimate Partner Violence: Not on file   Family History  Problem Relation Age of Onset   Hypertension Mother  Colon cancer Neg Hx    Esophageal cancer Neg Hx    Rectal cancer Neg Hx    Stomach cancer Neg Hx    Inflammatory bowel disease Neg Hx    Liver disease Neg Hx    Pancreatic cancer Neg Hx    I have reviewed her medical, social, and family history in detail and updated the electronic medical record as necessary.    PHYSICAL EXAMINATION  BP (!) 148/100 (BP Location: Left Arm, Patient Position: Sitting, Cuff Size: Large)   Pulse 80   Ht 5\' 2"  (1.575 m) Comment:  height measured without shoes  Wt 210 lb (95.3 kg)   LMP 07/01/2021   BMI 38.41 kg/m  Wt Readings from Last 3 Encounters:  07/03/21 210 lb (95.3 kg)  11/26/20 197 lb (89.4 kg)  10/04/20 190 lb (86.2 kg)  GEN: NAD, appears stated age, doesn't appear chronically ill PSYCH: Cooperative, without pressured speech EYE: Conjunctivae pink, sclerae anicteric ENT: MMM CV: Nontachycardic  RESP: No audible wheezing GI: NABS, soft, rounded, protuberant, nontender, without rebound  MSK/EXT: No significant lower extremity edema SKIN: No jaundice NEURO:  Alert & Oriented x 3, no focal deficits   REVIEW OF DATA  I reviewed the following data at the time of this encounter:  GI Procedures and Studies  Previously reviewed EGD  Laboratory Studies  Reviewed those in EPIC  Imaging Studies  No new imaging studies to review   ASSESSMENT  Tamara Church is a 45 y.o. female with a pmh significant for hypertension, anemia, cholelithiasis, hiatal hernia, GERD (microscopic esophagitis noted on biopsies).  The patient is seen today for evaluation and management of:  1. Gastroesophageal reflux disease with esophagitis without hemorrhage   2. Calculus of gallbladder without cholecystitis without obstruction   3. Colon cancer screening    The patient is hemodynamically and clinically stable.  She has done very well over the course of the last year.  She is taking her PPI once daily at this time regularly.  We will try to decrease her PPI further and try and see how she does.  If she feels uncomfortable or has progressive symptoms she will be increased back to 40 mg daily.  I will see her next year and she will return 46 at which point colon cancer screening is recommended we will decide on doing that in 2023 after her birthday.  All patient questions were answered to the best of my ability, and the patient agrees to the aforementioned plan of action with follow-up as indicated.   PLAN  Transition Nexium to 20  mg daily If symptoms increase or progress while on 20 mg then can go back up to 40 mg daily Patient is doing well no need for gastric emptying study Colon cancer screening at age 39 in 2023   No orders of the defined types were placed in this encounter.   New Prescriptions   ESOMEPRAZOLE (NEXIUM) 20 MG CAPSULE    Take 1 capsule (20 mg total) by mouth daily at 12 noon.   Modified Medications   No medications on file    Planned Follow Up Return in about 1 year (around 07/03/2022).   Total Time in Face-to-Face and in Coordination of Care for patient including independent/personal interpretation/review of prior testing, medical history, examination, medication adjustment, communicating results with the patient directly, and documentation with the EHR is 20 minutes.   07/05/2022, MD [facility] # [name]

## 2021-07-05 ENCOUNTER — Encounter: Payer: Self-pay | Admitting: Gastroenterology

## 2021-07-05 DIAGNOSIS — K8020 Calculus of gallbladder without cholecystitis without obstruction: Secondary | ICD-10-CM | POA: Insufficient documentation

## 2021-07-05 DIAGNOSIS — Z1211 Encounter for screening for malignant neoplasm of colon: Secondary | ICD-10-CM | POA: Insufficient documentation

## 2021-07-05 HISTORY — DX: Calculus of gallbladder without cholecystitis without obstruction: K80.20

## 2021-07-23 ENCOUNTER — Emergency Department (HOSPITAL_BASED_OUTPATIENT_CLINIC_OR_DEPARTMENT_OTHER)
Admission: EM | Admit: 2021-07-23 | Discharge: 2021-07-23 | Disposition: A | Discharge status: Home / Self Care | Payer: Medicaid Other | Attending: Emergency Medicine | Admitting: Emergency Medicine

## 2021-07-23 ENCOUNTER — Encounter (HOSPITAL_BASED_OUTPATIENT_CLINIC_OR_DEPARTMENT_OTHER): Payer: Self-pay

## 2021-07-23 ENCOUNTER — Other Ambulatory Visit: Payer: Self-pay

## 2021-07-23 DIAGNOSIS — K30 Functional dyspepsia: Secondary | ICD-10-CM | POA: Diagnosis not present

## 2021-07-23 DIAGNOSIS — Z79899 Other long term (current) drug therapy: Secondary | ICD-10-CM | POA: Insufficient documentation

## 2021-07-23 DIAGNOSIS — R519 Headache, unspecified: Secondary | ICD-10-CM | POA: Insufficient documentation

## 2021-07-23 DIAGNOSIS — K219 Gastro-esophageal reflux disease without esophagitis: Secondary | ICD-10-CM | POA: Insufficient documentation

## 2021-07-23 DIAGNOSIS — I10 Essential (primary) hypertension: Secondary | ICD-10-CM | POA: Diagnosis not present

## 2021-07-23 DIAGNOSIS — R109 Unspecified abdominal pain: Secondary | ICD-10-CM | POA: Diagnosis present

## 2021-07-23 DIAGNOSIS — N182 Chronic kidney disease, stage 2 (mild): Secondary | ICD-10-CM | POA: Diagnosis not present

## 2021-07-23 DIAGNOSIS — I129 Hypertensive chronic kidney disease with stage 1 through stage 4 chronic kidney disease, or unspecified chronic kidney disease: Secondary | ICD-10-CM | POA: Diagnosis not present

## 2021-07-23 DIAGNOSIS — R110 Nausea: Secondary | ICD-10-CM

## 2021-07-23 LAB — COMPREHENSIVE METABOLIC PANEL
ALT: 15 U/L (ref 0–44)
AST: 18 U/L (ref 15–41)
Albumin: 4.6 g/dL (ref 3.5–5.0)
Alkaline Phosphatase: 80 U/L (ref 38–126)
Anion gap: 10 (ref 5–15)
BUN: 9 mg/dL (ref 6–20)
CO2: 24 mmol/L (ref 22–32)
Calcium: 9.9 mg/dL (ref 8.9–10.3)
Chloride: 102 mmol/L (ref 98–111)
Creatinine, Ser: 0.68 mg/dL (ref 0.44–1.00)
GFR, Estimated: >60 mL/min (ref >60)
Glucose, Bld: 125 mg/dL — ABNORMAL HIGH (ref 70–99)
Potassium: 3.3 mmol/L — ABNORMAL LOW (ref 3.5–5.1)
Sodium: 136 mmol/L (ref 135–145)
Total Bilirubin: 0.7 mg/dL (ref 0.3–1.2)
Total Protein: 9.5 g/dL — ABNORMAL HIGH (ref 6.5–8.1)

## 2021-07-23 LAB — URINALYSIS, MICROSCOPIC (REFLEX)

## 2021-07-23 LAB — URINALYSIS, ROUTINE W REFLEX MICROSCOPIC
Bilirubin Urine: NEGATIVE
Glucose, UA: NEGATIVE mg/dL
Ketones, ur: NEGATIVE mg/dL
Leukocytes,Ua: NEGATIVE
Nitrite: NEGATIVE
Protein, ur: NEGATIVE mg/dL
Specific Gravity, Urine: 1.01 (ref 1.005–1.030)
pH: 6.5 (ref 5.0–8.0)

## 2021-07-23 LAB — CBC
HCT: 43.4 % (ref 36.0–46.0)
Hemoglobin: 14.3 g/dL (ref 12.0–15.0)
MCH: 28.7 pg (ref 26.0–34.0)
MCHC: 32.9 g/dL (ref 30.0–36.0)
MCV: 87.1 fL (ref 80.0–100.0)
Platelets: 303 10*3/uL (ref 150–400)
RBC: 4.98 MIL/uL (ref 3.87–5.11)
RDW: 14.4 % (ref 11.5–15.5)
WBC: 7.7 10*3/uL (ref 4.0–10.5)
nRBC: 0 % (ref 0.0–0.2)

## 2021-07-23 LAB — LIPASE, BLOOD: Lipase: 31 U/L (ref 11–51)

## 2021-07-23 LAB — PREGNANCY, URINE: Preg Test, Ur: NEGATIVE

## 2021-07-23 MED ORDER — ONDANSETRON 4 MG PO TBDP
4.0000 mg | ORAL_TABLET | Freq: Once | ORAL | Status: AC | PRN
  Administered 2021-07-23: 4 mg via ORAL
  Filled 2021-07-23: qty 1

## 2021-07-23 MED ORDER — ONDANSETRON 4 MG PO TBDP: 4.0000 mg | ORAL_TABLET | Freq: Three times a day (TID) | ORAL | 0 refills | Status: DC | PRN

## 2021-07-23 MED ORDER — ACETAMINOPHEN 325 MG PO TABS
650.0000 mg | ORAL_TABLET | Freq: Once | ORAL | Status: AC
  Administered 2021-07-23: 650 mg via ORAL
  Filled 2021-07-23: qty 2

## 2021-07-23 MED ORDER — ALUM & MAG HYDROXIDE-SIMETH 200-200-20 MG/5ML PO SUSP
30.0000 mL | Freq: Once | ORAL | Status: AC
  Administered 2021-07-23: 30 mL via ORAL
  Filled 2021-07-23: qty 30

## 2021-07-23 MED ORDER — LIDOCAINE VISCOUS HCL 2 % MT SOLN
15.0000 mL | Freq: Once | OROMUCOSAL | Status: AC
  Administered 2021-07-23: 15 mL via ORAL
  Filled 2021-07-23: qty 15

## 2021-07-23 NOTE — ED Triage Notes (Signed)
Pt reports abdominal pain similar to her hx of acid reflux since last night after eating a slice of pizza. Pt belching frequently since. Pt endorses nausea without vomiting. Symptoms unrelieved by omeprazole per pt.

## 2021-07-23 NOTE — ED Provider Notes (Addendum)
[facility] EMERGENCY DEPARTMENT Provider Note   CSN: [id number] Arrival date & time: 07/23/21  0758     History Chief Complaint  Patient presents with   Abdominal Pain    Tamara Church is a 45 y.o. female.  45 year old female presents today with chief complaint of abdominal pain.  Upon further investigation patient states that she was experiencing more nausea rather than pain.  She has a history of heartburn indigestion and sees a GI specialist and currently takes omeprazole once a day which has been managing her heartburn symptoms fairly well.  However last night patient attempted to eat a few bites of cheese pizza which she normally tries to avoid, and this seemed to aggravate her symptoms.  Patient states that she felt nauseous all night and felt that she had some indigestion getting caught in her throat along with copious belching.  Denies chest pain, vomiting, abdominal pain or tenderness, cramping, and diarrhea.  She was given Zofran upon arrival to the ED which reportedly resolved all of her symptoms. Patient also complains of a global headache that started today secondary to a sleepless night last night.  The history is provided by the patient.  Abdominal Pain Pain quality: not bloating   Pain radiates to:  Does not radiate Pain severity:  No pain Onset quality:  Unable to specify Progression:  Worsening Chronicity:  New Context: diet changes   Relieved by:  Bowel activity and antacids Associated symptoms: belching and nausea   Associated symptoms: no chest pain, no constipation, no diarrhea, no fever, no shortness of breath, no sore throat and no vomiting       Past Medical History:  Diagnosis Date   Anemia    GERD (gastroesophageal reflux disease)    Hypertension     Patient Active Problem List   Diagnosis Date Noted   Colon cancer screening 07/05/2021   Calculus of gallbladder without cholecystitis without obstruction 07/05/2021   Prolonged QT  interval 10/04/2020   Sore throat 09/18/2020   Gastritis and gastroduodenitis 07/27/2020   Abdominal pain, epigastric 07/27/2020   Unintentional weight loss 05/18/2020   Early satiety 05/18/2020   Non-intractable vomiting 05/18/2020   Constipation 05/18/2020   Bacterial vaginosis 05/08/2020   UTI (urinary tract infection) 04/23/2020   Palpitations 03/22/2020   Adjustment disorder with depressed mood 03/15/2020   Seasonal allergies 02/08/2020   Decreased hearing 12/15/2019   CKD (chronic kidney disease) stage 2, GFR 60-89 ml/min 03/21/2012   ANEMIA 09/07/2008   GERD 09/04/2008   OBESITY, NOS 11/18/2006   HYPERTENSION, BENIGN SYSTEMIC 11/18/2006    Past Surgical History:  Procedure Laterality Date   NO PAST SURGERIES       OB History     Gravida  6   Para  6   Term  6   Preterm  0   AB  0   Living  6      SAB  0   IAB  0   Ectopic  0   Multiple  0   Live Births              Family History  Problem Relation Age of Onset   Hypertension Mother    Colon cancer Neg Hx    Esophageal cancer Neg Hx    Rectal cancer Neg Hx    Stomach cancer Neg Hx    Inflammatory bowel disease Neg Hx    Liver disease Neg Hx    Pancreatic cancer Neg Hx  Social History   Tobacco Use   Smoking status: Never   Smokeless tobacco: Never  Vaping Use   Vaping Use: Never used  Substance Use Topics   Alcohol use: No   Drug use: No    Home Medications Prior to Admission medications   Medication Sig Start Date End Date Taking? Authorizing Provider  ondansetron (ZOFRAN ODT) 4 MG disintegrating tablet Take 1 tablet (4 mg total) by mouth every 8 (eight) hours as needed for nausea or vomiting. 07/23/21  Yes Raynald Blend R, PA-C  amLODipine (NORVASC) 10 MG tablet TAKE 1 TABLET BY MOUTH AT BEDTIME 04/29/21   Fayette Pho, MD  esomeprazole (NEXIUM) 20 MG capsule Take 1 capsule (20 mg total) by mouth daily at 12 noon. 07/03/21   Mansouraty, Netty Starring., MD  levonorgestrel  (MIRENA) 20 MCG/24HR IUD 1 each by Intrauterine route once.    [provider]    Allergies    Ace inhibitors, Angiotensin receptor blockers, and Hctz [hydrochlorothiazide]  Review of Systems   Review of Systems  Constitutional:  Negative for appetite change and fever.  HENT: Negative.  Negative for sore throat and voice change.   Eyes: Negative.   Respiratory:  Negative for shortness of breath.   Cardiovascular: Negative.  Negative for chest pain.  Gastrointestinal:  Positive for abdominal pain and nausea. Negative for constipation, diarrhea and vomiting.  Endocrine: Negative.   Genitourinary: Negative.   Musculoskeletal: Negative.   Skin:  Negative for rash.  Neurological:  Negative for headaches.  All other systems reviewed and are negative.  Physical Exam Updated Vital Signs BP (!) 143/98 (BP Location: Right Arm)   Pulse 81   Temp 98 F (36.7 C) (Oral)   Resp 20   Ht 5\' 2"  (1.575 m)   Wt 90.7 kg   LMP 07/01/2021   SpO2 98%   BMI 36.58 kg/m   Physical Exam Vitals and nursing note reviewed.  Constitutional:      General: She is not in acute distress.    Appearance: She is not ill-appearing or toxic-appearing.  HENT:     Head: Atraumatic.     Nose: Nose normal.     Mouth/Throat:     Mouth: Mucous membranes are moist.  Eyes:     Conjunctiva/sclera: Conjunctivae normal.  Cardiovascular:     Rate and Rhythm: Normal rate.     Pulses: Normal pulses.          Radial pulses are 2+ on the right side and 2+ on the left side.       Dorsalis pedis pulses are 2+ on the right side and 2+ on the left side.     Heart sounds: Normal heart sounds.  Pulmonary:     Effort: Pulmonary effort is normal. No tachypnea or respiratory distress.     Breath sounds: Normal breath sounds.  Abdominal:     General: Abdomen is flat. Bowel sounds are normal. There is no distension.     Palpations: Abdomen is soft.     Tenderness: There is no abdominal tenderness.  Musculoskeletal:         General: Normal range of motion.     Cervical back: Normal range of motion.  Skin:    General: Skin is warm.     Capillary Refill: Capillary refill takes less than 2 seconds.  Neurological:     General: No focal deficit present.     Mental Status: She is alert.  Psychiatric:  Mood and Affect: Mood normal.    ED Results / Procedures / Treatments   Labs (all labs ordered are listed, but only abnormal results are displayed) Labs Reviewed  COMPREHENSIVE METABOLIC PANEL - Abnormal; Notable for the following components:      Result Value   Potassium 3.3 (*)    Glucose, Bld 125 (*)    Total Protein 9.5 (*)    All other components within normal limits  URINALYSIS, ROUTINE W REFLEX MICROSCOPIC - Abnormal; Notable for the following components:   Color, Urine STRAW (*)    Hgb urine dipstick TRACE (*)    All other components within normal limits  URINALYSIS, MICROSCOPIC (REFLEX) - Abnormal; Notable for the following components:   Bacteria, UA FEW (*)    All other components within normal limits  LIPASE, BLOOD  CBC  PREGNANCY, URINE    EKG EKG Interpretation  Date/Time:  Wednesday July 23 2021 08:28:50 EDT Ventricular Rate:  98 PR Interval:  164 QRS Duration: 84 QT Interval:  348 QTC Calculation: 444 R Axis:   18 Text Interpretation: Normal sinus rhythm Cannot rule out Anterior infarct , age undetermined Abnormal ECG When compared to prior, similar appearance with shorter QTC. No STEMI Confirmed by Antony Blackbird [phone]) on 07/23/2021 9:35:44 AM  Radiology No results found.  Procedures Procedures   Medications Ordered in ED Medications  ondansetron (ZOFRAN-ODT) disintegrating tablet 4 mg (4 mg Oral Given 07/23/21 0824)  alum & mag hydroxide-simeth (MAALOX/MYLANTA) 200-200-20 MG/5ML suspension 30 mL (30 mLs Oral Given 07/23/21 1030)    And  lidocaine (XYLOCAINE) 2 % viscous mouth solution 15 mL (15 mLs Oral Given 07/23/21 1030)  acetaminophen (TYLENOL) tablet  650 mg (650 mg Oral Given 07/23/21 1030)    ED Course  I have reviewed the triage vital signs and the nursing notes.  Pertinent labs & imaging results that were available during my care of the patient were reviewed by me and considered in my medical decision making (see chart for details).    MDM Rules/Calculators/A&P                         This is a 45 year old female previous history of indigestion presenting today with chief complaint of abdominal pain and nausea.  Patient's heartburn has been well controlled with daily omeprazole however Patient has been avoiding dairy which can be triggering for her indigestion.  Last night she had a few bites of cheese pizza which seem to trigger the current symptoms.  Symptoms mostly resolved with a dose of Zofran in the ED, with some mild heartburn symptoms remaining.  Patient was given [name] which further improved her symptoms.  Abdomen was soft nontender nondistended, and patient had bowel movement as recent as this morning that was of normal consistency, so low concern for constipation, SBO or gastritis.  EKG, CMP, CBC and urinalysis does not indicate a cardiac or infectious pathology.   Patient symptoms likely due to to exacerbation of indigestion secondary to eating dairy.  Advised patient to continue daily omeprazole, and if she has another acute exacerbation can take Tums or Maalox in attempt to resolve symptoms at home setting.  Patient to follow-up with GI doctor as needed.  Patient also complained of global headache that started this morning secondary to poor sleep, patient was given 650 mg Tylenol in the ED today with some mild improvement in symptoms prior to discharge . Final Clinical Impression(s) / ED Diagnoses Final diagnoses:  Indigestion  Nausea    Rx / DC Orders ED Discharge Orders          Ordered    ondansetron (ZOFRAN ODT) 4 MG disintegrating tablet  Every 8 hours PRN        07/23/21 1052             Tonye Pearson, PA-C 07/23/21 1100    Tonye Pearson, [place] 07/23/21 1113    Tegeler, Gwenyth Allegra, MD 07/23/21 1553

## 2021-07-23 NOTE — Discharge Instructions (Addendum)
You are diagnosed today with an acute exacerbation of heartburn/indigestion.  Continue taking your omeprazole daily as prescribed by your GI doctor, and if another exacerbation occurs, you may take either Maalox or Tums to resolve your symptoms.  You were also sent home with a prescription for Zofran to take as needed for your nausea. If symptoms significantly worsen, feel free to come back to the emergency department for additional evaluation.  I hope you feel better soon!

## 2021-07-24 ENCOUNTER — Telehealth: Payer: Self-pay

## 2021-07-24 NOTE — Telephone Encounter (Signed)
Transition Care Management Follow-up Telephone Call Date of discharge and from where: 07/23/2021 from [facility] How have you been since you were released from the hospital? Pt stated that she is feeling much better today and did not have any questions or concerns at this time.  Any questions or concerns? No  Items Reviewed: Did the pt receive and understand the discharge instructions provided? Yes  Medications obtained and verified? Yes  Other? No  Any new allergies since your discharge? No  Dietary orders reviewed? No Do you have support at home? Yes   Functional Questionnaire: (I = Independent and D = Dependent) ADLs: I  Bathing/Dressing- I  Meal Prep- I  Eating- I  Maintaining continence- I  Transferring/Ambulation- I  Managing Meds- I   Follow up appointments reviewed:  PCP Hospital f/u appt confirmed? No  Specialist Hospital f/u appt confirmed? No   Are transportation arrangements needed? No  If their condition worsens, is the pt aware to call PCP or go to the Emergency Dept.? Yes Was the patient provided with contact information for the PCP's office or ED? Yes Was to pt encouraged to call back with questions or concerns? Yes

## 2021-08-21 ENCOUNTER — Other Ambulatory Visit: Payer: Self-pay

## 2021-08-21 ENCOUNTER — Ambulatory Visit (HOSPITAL_COMMUNITY)
Admission: EM | Admit: 2021-08-21 | Discharge: 2021-08-21 | Disposition: A | Discharge status: Home / Self Care | Payer: Medicaid Other | Attending: Emergency Medicine | Admitting: Emergency Medicine

## 2021-08-21 ENCOUNTER — Encounter (HOSPITAL_COMMUNITY): Payer: Self-pay | Admitting: Emergency Medicine

## 2021-08-21 DIAGNOSIS — Z20822 Contact with and (suspected) exposure to covid-19: Secondary | ICD-10-CM | POA: Diagnosis not present

## 2021-08-21 DIAGNOSIS — J029 Acute pharyngitis, unspecified: Secondary | ICD-10-CM | POA: Diagnosis not present

## 2021-08-21 LAB — POCT RAPID STREP A, ED / UC: Streptococcus, Group A Screen (Direct): NEGATIVE

## 2021-08-21 MED ORDER — FLUTICASONE PROPIONATE 50 MCG/ACT NA SUSP: 2.0000 | Freq: Every day | NASAL | 0 refills | Status: DC

## 2021-08-21 NOTE — ED Triage Notes (Signed)
08/19/2021 started feeling throat dryness.  Now throat is sore, nose is stuffy.  Patient feels drainage in throat.

## 2021-08-21 NOTE — Discharge Instructions (Addendum)
COVID will be back in 6 to 24 hours.  Your rapid strep was negative today, so we have sent off a throat culture.  We will contact you and call in the appropriate antibiotics if your culture comes back positive for an infection requiring antibiotic treatment.  Give [place] a working phone number.    Make sure you drink plenty of extra fluids.  Some people find salt water gargles and  Traditional Medicinal's "Throat Coat" tea helpful. Take 5 mL of liquid Benadryl and 5 mL of Maalox. Mix it together, and then hold it in your mouth for as long as you can and then swallow. You may do this 4 times a day.     continue Coricidin high blood pressure or start Mucinex, whichever works better for you, Flonase, saline nasal irrigation with a Lloyd Huger Med rinse and distilled water as often as you want.  Continue humidifier.  Go to www.goodrx.com  or www.costplusdrugs.com to look up your medications. This will give you a list of where you can find your prescriptions at the most affordable prices. Or ask the pharmacist what the cash price is, or if they have any other discount programs available to help make your medication more affordable. This can be less expensive than what you would pay with insurance.

## 2021-08-21 NOTE — ED Provider Notes (Signed)
HPI  SUBJECTIVE:  Patient reports sore throat starting 2 days ago.  She has tried throat lozenges, Corcedrin high blood pressure and a humidifier.  The Corcedrin and humidifier help.  No aggravating factors. No fever   No neck stiffness  + nasal congestion, rhinorrhea, sinus pain and pressure + Postnasal drip No Myalgias + Headache No Rash  No loss of taste or smell No shortness of breath or difficulty breathing No nausea, vomiting No diarrhea No abdominal pain     No Recent Strep, flu, COVID exposure She did not get the flu or COVID vaccines No reflux symptoms No Allergy sxs  No Breathing difficulty, voice changes, sensation of throat swelling shut No Drooling No Trismus No abx in past month.  No antipyretic in the past 6 hours She has a history of COVID x2, allergies, hypertension, GERD, prolonged QT. LMP: Now.  Denies the possibility of being pregnant PMD: [facility]   Past Medical History:  Diagnosis Date   Anemia    GERD (gastroesophageal reflux disease)    Hypertension     Past Surgical History:  Procedure Laterality Date   NO PAST SURGERIES      Family History  Problem Relation Age of Onset   Hypertension Mother    Colon cancer Neg Hx    Esophageal cancer Neg Hx    Rectal cancer Neg Hx    Stomach cancer Neg Hx    Inflammatory bowel disease Neg Hx    Liver disease Neg Hx    Pancreatic cancer Neg Hx     Social History   Tobacco Use   Smoking status: Never   Smokeless tobacco: Never  Vaping Use   Vaping Use: Never used  Substance Use Topics   Alcohol use: No   Drug use: No    No current facility-administered medications for this encounter.  Current Outpatient Medications:    fluticasone (FLONASE) 50 MCG/ACT nasal spray, Place 2 sprays into both nostrils daily., Disp: 16 g, Rfl: 0   amLODipine (NORVASC) 10 MG tablet, TAKE 1 TABLET BY MOUTH AT BEDTIME, Disp: 90 tablet, Rfl: 1   esomeprazole (NEXIUM) 20 MG capsule, Take 1  capsule (20 mg total) by mouth daily at 12 noon., Disp: 30 capsule, Rfl: 1   levonorgestrel (MIRENA) 20 MCG/24HR IUD, 1 each by Intrauterine route once., Disp: , Rfl:   Allergies  Allergen Reactions   Ace Inhibitors Cough   Angiotensin Receptor Blockers     cough   Hctz [Hydrochlorothiazide]     hypokalemia     ROS  As noted in HPI.   Physical Exam  BP (!) 154/101 (BP Location: Left Arm) Comment (BP Location): takes blood pressure medicine at night time  Pulse 85   Temp 98.4 F (36.9 C) (Oral)   Resp 20   LMP 07/22/2021   SpO2 100%   Constitutional: Well developed, well nourished, no acute distress Eyes:  EOMI, conjunctiva normal bilaterally HENT: Normocephalic, atraumatic,mucus membranes moist.  Positive nasal congestion.  Pale, swollen turbinates.  Positive mild maxillary, frontal sinus tenderness.  Slightly erythematous oropharynx, tonsils enlarged without exudates.  Uvula midline.   Respiratory: Normal inspiratory effort Cardiovascular: Normal rate, no murmurs, rubs, gallops GI: nondistended, nontender. No appreciable splenomegaly skin: No rash, skin intact Lymph: Positive cervical lymphadenopathy Musculoskeletal: no deformities Neurologic: Alert & oriented x 3, no focal neuro deficits Psychiatric: Speech and behavior appropriate.    ED Course   Medications - No data to display  Orders Placed This Encounter  Procedures   SARS CORONAVIRUS 2 (TAT 6-24 HRS) Nasopharyngeal Nasopharyngeal Swab    Standing Status:   Standing    Number of Occurrences:   1   Culture, group A strep (throat)    Standing Status:   Standing    Number of Occurrences:   1   POCT Rapid Strep A    Standing Status:   Standing    Number of Occurrences:   1   Results for orders placed or performed during the hospital encounter of 08/21/21  SARS CORONAVIRUS 2 (TAT 6-24 HRS) Nasopharyngeal Nasopharyngeal Swab   Specimen: Nasopharyngeal Swab  Result Value Ref Range   SARS Coronavirus 2  NEGATIVE NEGATIVE  Culture, group A strep (throat)   Specimen: Throat  Result Value Ref Range   Specimen Description THROAT    Special Requests NONE    Culture      CULTURE REINCUBATED FOR BETTER GROWTH Performed at [facility], [address], [place], [place] [address]    Report Status PENDING   POCT Rapid Strep A  Result Value Ref Range   Streptococcus, Group A Screen (Direct) NEGATIVE NEGATIVE    No results found for this or any previous visit (from the past 24 hour(s)).  No results found.  ED Clinical Impression  1. Sore throat   2. Encounter for laboratory testing for COVID-19 virus      ED Assessment/Plan  Suspect URI, postnasal drip causing sore throat.  will check a strep.  Also checking COVID per patient request.  She will be a candidate for antivirals based on unvaccinated status, BMI above 30 and hypertension.  She is to continue Coricidin high blood pressure or start Mucinex, whichever works better for her, Flonase, saline nasal irrigation with a Lloyd Huger Med rinse and distilled water as often as she wants.  Continue humidifier.  While she does have sinus tenderness, she has no clear indications for antibiotics  Rapid strep negative. Obtaining throat culture to guide antibiotic treatment. Discussed this with patient/ parent. We'll contact them if culture is positive, and will call in Appropriate antibiotics. Patient home with ibuprofen, Tylenol, Benadryl/Maalox mixture.. Patient to followup with PMD when necessaruy.  COVID negative.  Discussed labs,  MDM, plan and followup with patient. Discussed sn/sx that should prompt return to the ED. patient agrees with plan.   Meds ordered this encounter  Medications   fluticasone (FLONASE) 50 MCG/ACT nasal spray    Sig: Place 2 sprays into both nostrils daily.    Dispense:  16 g    Refill:  0     *This clinic note was created using Scientist, clinical (histocompatibility and immunogenetics). Therefore, there may be occasional mistakes despite  careful proofreading.     Domenick Gong, MD 08/23/21 [phone]

## 2021-08-22 LAB — SARS CORONAVIRUS 2 (TAT 6-24 HRS): SARS Coronavirus 2: NEGATIVE

## 2021-08-23 LAB — CULTURE, GROUP A STREP (THRC)

## 2021-09-01 NOTE — Progress Notes (Deleted)
    SUBJECTIVE:   CHIEF COMPLAINT / HPI: HA  Patient reports experiencing headaches***   PERTINENT  PMH / PSH: ***  OBJECTIVE:   There were no vitals taken for this visit.  Physical Exam   ASSESSMENT/PLAN:   No problem-specific Assessment & Plan notes found for this encounter.     Ronnald Ramp, MD [facility]

## 2021-09-02 ENCOUNTER — Ambulatory Visit: Payer: Medicaid Other | Admitting: Family Medicine

## 2021-09-29 ENCOUNTER — Other Ambulatory Visit: Payer: Self-pay | Admitting: Family Medicine

## 2021-10-02 ENCOUNTER — Telehealth: Payer: Self-pay | Admitting: Gastroenterology

## 2021-10-02 NOTE — Telephone Encounter (Signed)
The pt was transitioned to 20 mg nexium daily down from 40 mg daily.  She states that this is not working as well as Nexium 40 mg.  She was told at the last office visit she could resume 40 mg if 20 mg was not working.  See below:   PLAN  Transition Nexium to 20 mg daily If symptoms increase or progress while on 20 mg then can go back up to 40 mg daily Patient is doing well no need for gastric emptying study Colon cancer screening at age 45 in 2023  She will increase to 40 mg Nexium daily and call back if her symptoms do not resolve.

## 2021-10-02 NOTE — Telephone Encounter (Signed)
Left message on machine to call back  

## 2021-10-02 NOTE — Telephone Encounter (Signed)
Patient stated that the new reflux medication is not agreeing with her stomach, seeking advice if there is a different medication she can take. Please advise.

## 2021-10-07 ENCOUNTER — Encounter: Payer: Self-pay | Admitting: Family Medicine

## 2021-10-07 ENCOUNTER — Other Ambulatory Visit: Payer: Self-pay

## 2021-10-07 ENCOUNTER — Ambulatory Visit (INDEPENDENT_AMBULATORY_CARE_PROVIDER_SITE_OTHER): Payer: Medicaid Other | Admitting: Family Medicine

## 2021-10-07 VITALS — BP 148/90 | HR 87 | Ht 62.0 in | Wt 211.4 lb

## 2021-10-07 DIAGNOSIS — I10 Essential (primary) hypertension: Secondary | ICD-10-CM | POA: Diagnosis not present

## 2021-10-07 DIAGNOSIS — R7303 Prediabetes: Secondary | ICD-10-CM | POA: Diagnosis not present

## 2021-10-07 DIAGNOSIS — Z111 Encounter for screening for respiratory tuberculosis: Secondary | ICD-10-CM | POA: Diagnosis not present

## 2021-10-07 LAB — POCT GLYCOSYLATED HEMOGLOBIN (HGB A1C): Hemoglobin A1C: 5.8 % — AB (ref 4.0–5.6)

## 2021-10-07 MED ORDER — SPIRONOLACTONE 25 MG PO TABS: 25.0000 mg | ORAL_TABLET | Freq: Every day | ORAL | 3 refills | Status: DC

## 2021-10-07 NOTE — Patient Instructions (Addendum)
It was wonderful to meet you today. Thank you for allowing me to be a part of your care. Below is a short summary of what we discussed at your visit today:  Blood pressure Today your blood pressure was elevated.  Our goal is to be less than 130 on the top and less than 80 on the bottom.  Keep taking the amlodipine 10 mg every day.  Start taking the spironolactone 25 mg every day.  Come back in 2 weeks for a follow-up appointment. At that appointment, we will: - Recheck your blood pressure - Make sure you are tolerating the new medication well - Collect a blood sample to test your kidney function - Administer your Tdap vaccine    If you have any questions or concerns, please do not hesitate to contact us via phone or MyChart message.   Ezequiel Essex, MD

## 2021-10-07 NOTE — Progress Notes (Signed)
° ° °  SUBJECTIVE:   CHIEF COMPLAINT / HPI:   HTN check  - had been asked to come into clinic for BP check as she had not been seen in over a year and requested refills - BP today elevated in clinic - chart review indicates persistently elevated Bps throughout most of 2022 - current regimen amlodipine 10 mg daily - no acute neurologic symptoms, no red flag symptoms  BP Readings from Last 3 Encounters:  10/07/21 (!) 148/90  08/21/21 (!) 154/101  07/23/21 (!) 143/98    Prediabetes - previous A1c August 2021 - no current medications for diabetes - patient amenable to check today  Lab Results  Component Value Date   HGBA1C 5.8 (A) 10/07/2021   HGBA1C 5.7 05/16/2020   HGBA1C 6.2 (H) 04/03/2019   Tb screening - patient requests TB screening blood work today for work - asymptomatic, no known exposures  77  PMH / [place]:  Patient Active Problem List   Diagnosis Date Noted   Screening-pulmonary TB 10/08/2021   Prediabetes 10/08/2021   Colon cancer screening 07/05/2021   Prolonged QT interval 10/04/2020   Adjustment disorder with depressed mood 03/15/2020   Seasonal allergies 02/08/2020   Decreased hearing 12/15/2019   CKD (chronic kidney disease) stage 2, GFR 60-89 ml/min 03/21/2012   GERD 09/04/2008   OBESITY, NOS 11/18/2006   HYPERTENSION, BENIGN SYSTEMIC 11/18/2006     OBJECTIVE:   BP (!) 148/90    Pulse 87    Ht 5\' 2"  (1.575 m)    Wt 211 lb 6.4 oz (95.9 kg)    SpO2 99%    BMI 38.67 kg/m    PHQ-9:  Depression screen [facility] 2/9 10/08/2021 10/08/2021 05/08/2020  Decreased Interest 0 0 0  Down, Depressed, Hopeless 0 0 0  PHQ - 2 Score 0 0 0  Altered sleeping 0 - 0  Tired, decreased energy 0 - 0  Change in appetite 0 - 0  Feeling bad or failure about yourself  0 - 0  Trouble concentrating 0 - 0  Moving slowly or fidgety/restless 0 - 0  Suicidal thoughts 0 - 0  PHQ-9 Score 0 - 0  Difficult doing work/chores - - Not difficult at all     GAD-7: No flowsheet data  found.   Physical Exam General: Awake, lying down on exam table when provider entered the room and remained lying during discussion, no acute distress Cardiovascular: Regular rate and rhythm, S1 and S2 present, no murmurs auscultated Respiratory: Lung fields clear to auscultation bilaterally Neuro: Cranial nerves II through X grossly intact, able to move all extremities spontaneously Psych: Flat, withdrawn affect, soft speech, lying down on table during discussion   ASSESSMENT/PLAN:   HYPERTENSION, BENIGN SYSTEMIC Chronic, uncontrolled. BP today 148/90. No red flag symptoms. History of intolerances to HCTZ, ACEs, ARBs. Continue amlodipine. Start spironolactone. Return for recheck and Cr check in two weeks. See AVS for more.   Prediabetes Chronic, stable. A1c unchanged from previous. No indication for medication at this time.   Screening-pulmonary TB Requests for TB screening today for her job. Orders placed. Will follow up with results.      Ezequiel Essex, MD [place]

## 2021-10-08 ENCOUNTER — Encounter: Payer: Self-pay | Admitting: Family Medicine

## 2021-10-08 DIAGNOSIS — Z111 Encounter for screening for respiratory tuberculosis: Secondary | ICD-10-CM

## 2021-10-08 DIAGNOSIS — R7303 Prediabetes: Secondary | ICD-10-CM | POA: Insufficient documentation

## 2021-10-08 HISTORY — DX: Encounter for screening for respiratory tuberculosis: Z11.1

## 2021-10-08 NOTE — Assessment & Plan Note (Signed)
Requests for TB screening today for her job. Orders placed. Will follow up with results.

## 2021-10-08 NOTE — Assessment & Plan Note (Signed)
Chronic, stable. A1c unchanged from previous. No indication for medication at this time.

## 2021-10-08 NOTE — Assessment & Plan Note (Signed)
Chronic, uncontrolled. BP today 148/90. No red flag symptoms. History of intolerances to HCTZ, ACEs, ARBs. Continue amlodipine. Start spironolactone. Return for recheck and Cr check in two weeks. See AVS for more.

## 2021-10-10 ENCOUNTER — Encounter: Payer: Self-pay | Admitting: Family Medicine

## 2021-10-10 LAB — QUANTIFERON-TB GOLD PLUS
QuantiFERON Mitogen Value: >10 IU/mL
QuantiFERON Nil Value: 0.17 IU/mL
QuantiFERON TB1 Ag Value: 0.13 IU/mL
QuantiFERON TB2 Ag Value: 0.13 IU/mL
QuantiFERON-TB Gold Plus: NEGATIVE

## 2021-10-10 NOTE — Progress Notes (Signed)
Negative [employer] TB screen. Letter to patient for her to take to work.  Fayette Pho, MD

## 2021-10-12 ENCOUNTER — Other Ambulatory Visit: Payer: Self-pay

## 2021-10-12 ENCOUNTER — Emergency Department (HOSPITAL_BASED_OUTPATIENT_CLINIC_OR_DEPARTMENT_OTHER)
Admission: EM | Admit: 2021-10-12 | Discharge: 2021-10-12 | Disposition: A | Discharge status: Home / Self Care | Payer: Medicaid Other | Attending: Emergency Medicine | Admitting: Emergency Medicine

## 2021-10-12 ENCOUNTER — Encounter (HOSPITAL_BASED_OUTPATIENT_CLINIC_OR_DEPARTMENT_OTHER): Payer: Self-pay | Admitting: Emergency Medicine

## 2021-10-12 ENCOUNTER — Emergency Department (HOSPITAL_BASED_OUTPATIENT_CLINIC_OR_DEPARTMENT_OTHER): Payer: Medicaid Other

## 2021-10-12 DIAGNOSIS — Z79899 Other long term (current) drug therapy: Secondary | ICD-10-CM | POA: Diagnosis not present

## 2021-10-12 DIAGNOSIS — I10 Essential (primary) hypertension: Secondary | ICD-10-CM | POA: Diagnosis not present

## 2021-10-12 DIAGNOSIS — M79601 Pain in right arm: Secondary | ICD-10-CM

## 2021-10-12 DIAGNOSIS — R52 Pain, unspecified: Secondary | ICD-10-CM

## 2021-10-12 MED ORDER — LIDOCAINE 5 % EX PTCH
1.0000 | MEDICATED_PATCH | CUTANEOUS | Status: DC
  Administered 2021-10-12: 1 via TRANSDERMAL
  Filled 2021-10-12 (×2): qty 1

## 2021-10-12 MED ORDER — HYDROCODONE-ACETAMINOPHEN 5-325 MG PO TABS: 2.0000 | ORAL_TABLET | ORAL | 0 refills | Status: DC | PRN

## 2021-10-12 NOTE — Discharge Instructions (Signed)
Please return to the ED with any new or worsening symptoms such as chest pain, shortness of breath, inflammation of the skin of the right arm, nausea, vomiting, fevers Please follow-up with your PCP in the next week for ongoing evaluation management of this issue Please pick up the pain medication that I sent in for you.

## 2021-10-12 NOTE — ED Notes (Signed)
Pt. Returned from having venous doppler study

## 2021-10-12 NOTE — ED Triage Notes (Signed)
Pt c/o constant right arm pain x 1 week. Pt has tried over the counter pain relief methods without relief. Pt denies recent injury, reports woke up with pain to right arm.

## 2021-10-12 NOTE — ED Provider Notes (Signed)
[facility] EMERGENCY DEPARTMENT Provider Note   CSN: [id number] Arrival date & time: 10/12/21  1433     History  Chief Complaint  Patient presents with   Arm Pain    Right arm    Tamara Church is a 46 y.o. female with medical history significant for GERD, hypertension, palpitations.  Patient states that for the last 1 week she has had right arm pain that comes and goes.  She describes the pain as a "soreness, aching".  Patient states that the pain is relieved by propping up her arm and denies any aggravating factors.  Patient has attempted to alleviate pain utilizing ibuprofen with no relief.  Patient denies any preceding trauma to the arm.  The patient saw her PCP this past Tuesday but did not mention her arm pain.  Patient endorses right arm pain.  Patient denies numbness, tingling of right arm, chest pain, shortness of breath, abdominal pain, fevers.   Arm Pain Pertinent negatives include no chest pain, no abdominal pain and no shortness of breath.      Home Medications Prior to Admission medications   Medication Sig Start Date End Date Taking? Authorizing Provider  HYDROcodone-acetaminophen (NORCO/VICODIN) 5-325 MG tablet Take 2 tablets by mouth every 4 (four) hours as needed. 10/12/21  Yes Al Decant, PA-C  amLODipine (NORVASC) 10 MG tablet Take 1 tablet (10 mg total) by mouth at bedtime. Please return to clinic for BP check Jan 2023. 09/29/21   Fayette Pho, MD  esomeprazole (NEXIUM) 20 MG capsule Take 1 capsule (20 mg total) by mouth daily at 12 noon. 07/03/21   Mansouraty, Netty Starring., MD  fluticasone (FLONASE) 50 MCG/ACT nasal spray Place 2 sprays into both nostrils daily. 08/21/21   Domenick Gong, MD  levonorgestrel (MIRENA) 20 MCG/24HR IUD 1 each by Intrauterine route once.    [provider]  spironolactone (ALDACTONE) 25 MG tablet Take 1 tablet (25 mg total) by mouth at bedtime. 10/07/21   Fayette Pho, MD      Allergies    Ace  inhibitors, Angiotensin receptor blockers, and Hctz [hydrochlorothiazide]    Review of Systems   Review of Systems  Constitutional:  Negative for chills and fever.  Respiratory:  Negative for shortness of breath.   Cardiovascular:  Negative for chest pain.  Gastrointestinal:  Negative for abdominal pain, nausea and vomiting.  Musculoskeletal:  Positive for myalgias (R arm).  All other systems reviewed and are negative.  Physical Exam Updated Vital Signs BP (!) 164/101    Pulse 74    Temp 98.4 F (36.9 C) (Oral)    Resp 16    Ht 5\' 2"  (1.575 m)    Wt 95.7 kg    LMP 10/05/2021    SpO2 100%    BMI 38.59 kg/m  Physical Exam Vitals and nursing note reviewed.  Constitutional:      General: She is not in acute distress.    Appearance: She is not ill-appearing or toxic-appearing.  HENT:     Head: Normocephalic and atraumatic.     Mouth/Throat:     Mouth: Mucous membranes are moist.  Eyes:     Extraocular Movements: Extraocular movements intact.     Pupils: Pupils are equal, round, and reactive to light.  Cardiovascular:     Rate and Rhythm: Normal rate and regular rhythm.     Pulses:          Radial pulses are 2+ on the right side.  Pulmonary:  Effort: Pulmonary effort is normal.     Breath sounds: Normal breath sounds. No wheezing.  Abdominal:     General: Abdomen is flat. There is no distension.     Palpations: Abdomen is soft. There is no mass.     Tenderness: There is no abdominal tenderness. There is no guarding.  Musculoskeletal:     Right shoulder: Tenderness present.     Left shoulder: Normal.     Right upper arm: Tenderness present.     Cervical back: Normal range of motion and neck supple. No rigidity or tenderness.  Skin:    General: Skin is warm and dry.  Neurological:     General: No focal deficit present.     Mental Status: She is alert and oriented to person, place, and time.     Cranial Nerves: No cranial nerve deficit.     Sensory: No sensory deficit.      Motor: No weakness.    ED Results / Procedures / Treatments   Labs (all labs ordered are listed, but only abnormal results are displayed) Labs Reviewed - No data to display  EKG None  Radiology US Venous Img Upper Uni Left (DVT)  Result Date: 10/12/2021 CLINICAL DATA:  Right arm pain EXAM: RIGHT UPPER EXTREMITY VENOUS DOPPLER ULTRASOUND TECHNIQUE: Gray-scale sonography with graded compression, as well as color Doppler and duplex ultrasound were performed to evaluate the upper extremity deep venous system from the level of the subclavian vein and including the jugular, axillary, basilic, radial, ulnar and upper cephalic vein. Spectral Doppler was utilized to evaluate flow at rest and with distal augmentation maneuvers. COMPARISON:  None. FINDINGS: Contralateral Subclavian Vein: Respiratory phasicity is normal and symmetric with the symptomatic side. No evidence of thrombus. Normal compressibility. Internal Jugular Vein: No evidence of thrombus. Normal compressibility, respiratory phasicity and response to augmentation. Subclavian Vein: No evidence of thrombus. Normal compressibility, respiratory phasicity and response to augmentation. Axillary Vein: No evidence of thrombus. Normal compressibility, respiratory phasicity and response to augmentation. Cephalic Vein: No evidence of thrombus. Normal compressibility, respiratory phasicity and response to augmentation. Basilic Vein: No evidence of thrombus. Normal compressibility, respiratory phasicity and response to augmentation. Brachial Veins: No evidence of thrombus. Normal compressibility, respiratory phasicity and response to augmentation. Radial Veins: No evidence of thrombus. Normal compressibility, respiratory phasicity and response to augmentation. Ulnar Veins: No evidence of thrombus. Normal compressibility, respiratory phasicity and response to augmentation. Venous Reflux:  None visualized. Other Findings:  None visualized. IMPRESSION: No  evidence of DVT within the right upper extremity. Electronically Signed   By: Charlett NoseKevin  Dover M.D.   On: 10/12/2021 17:00    Procedures Procedures    Medications Ordered in ED Medications  lidocaine (LIDODERM) 5 % 1 patch (1 patch Transdermal Patch Applied 10/12/21 1650)    ED Course/ Medical Decision Making/ A&P                           Medical Decision Making Amount and/or Complexity of Data Reviewed ECG/medicine tests: ordered.  Risk Prescription drug management.   46 year old female with medical history of GERD and hypertension presents due to 1 week of right arm pain.  On examination, right arm is neurovascularly intact.  Patient has palpable 2+ radial pulse to the right arm.  Patient has 5 out of 5 strength and sensation to the right arm.  There are no signs of cellulitis, septic arthritis.  Patient has full range of motion to her  right shoulder and right elbow both passively and actively.  The patient denies numbness and tingling to the arm.  DVT ultrasound study of the right upper extremity was ordered and interpreted by me.  Results of the DVT study shows no signs of deep vein thrombosis or blood clot  At this time, the cause of the patient's right upper extremity pain is inconclusive.  I have advised the patient that she needs to follow-up with her PCP in the next week for ongoing evaluation management of this pain.  I offered the patient pain relief which she has agreed to.  I have given the patient return precautions he voiced understanding.  I have answered the patient's questions to her satisfaction.  The patient is stable on discharge.   Final Clinical Impression(s) / ED Diagnoses Final diagnoses:  Right arm pain    Rx / DC Orders ED Discharge Orders          Ordered    HYDROcodone-acetaminophen (NORCO/VICODIN) 5-325 MG tablet  Every 4 hours PRN        10/12/21 1759              Al Decant, PA-C 10/12/21 1759    Franne Forts, DO 10/14/21  1124

## 2021-10-13 ENCOUNTER — Telehealth: Payer: Self-pay

## 2021-10-13 NOTE — Telephone Encounter (Signed)
Transition Care Management Unsuccessful Follow-up Telephone Call  Date of discharge and from where:  10/12/2021[facility]  Attempts:  1st Attempt  Reason for unsuccessful TCM follow-up call:  Left voice message

## 2021-10-14 NOTE — Telephone Encounter (Signed)
Transition Care Management Unsuccessful Follow-up Telephone Call  Date of discharge and from where:  10/12/2021 from [facility]  Attempts:  2nd Attempt  Reason for unsuccessful TCM follow-up call:  Left voice message

## 2021-10-15 MED ORDER — ESOMEPRAZOLE MAGNESIUM 40 MG PO CPDR: 40.0000 mg | DELAYED_RELEASE_CAPSULE | Freq: Every day | ORAL | 2 refills | Status: DC

## 2021-10-15 NOTE — Addendum Note (Signed)
Addended byLucky Rathke on: 10/15/2021 12:21 PM   Modules accepted: Orders

## 2021-10-15 NOTE — Telephone Encounter (Signed)
Inbound call from patient requesting to increase Nexium to 40mg . States prescription [facility] on 

## 2021-10-15 NOTE — Telephone Encounter (Signed)
Transition Care Management Unsuccessful Follow-up Telephone Call  Date of discharge and from where:  10/12/2021[facility]  Attempts:  3rd Attempt  Reason for unsuccessful TCM follow-up call:  Left voice message

## 2021-10-15 NOTE — Telephone Encounter (Signed)
Thanks for update. GM 

## 2021-10-15 NOTE — Telephone Encounter (Signed)
FYI-  Dr Rush Landmark patient tried to take Nexium 20mg  once daily, but had increase in symptoms. Pt requested to increase back to 40mg  once daily.   Prescription for Nexium 40mg  once daily has been sent to [facility] on [address]. Pt has been informed.

## 2021-10-22 ENCOUNTER — Encounter: Payer: Self-pay | Admitting: Family Medicine

## 2021-10-22 ENCOUNTER — Other Ambulatory Visit: Payer: Self-pay

## 2021-10-22 ENCOUNTER — Ambulatory Visit
Admission: RE | Admit: 2021-10-22 | Discharge: 2021-10-22 | Disposition: A | Discharge status: Home / Self Care | Payer: Medicaid Other | Source: Ambulatory Visit | Attending: Family Medicine | Admitting: Family Medicine

## 2021-10-22 ENCOUNTER — Ambulatory Visit (INDEPENDENT_AMBULATORY_CARE_PROVIDER_SITE_OTHER): Payer: Medicaid Other | Admitting: Family Medicine

## 2021-10-22 VITALS — BP 153/99 | HR 93 | Ht 62.0 in | Wt 213.8 lb

## 2021-10-22 DIAGNOSIS — Z0000 Encounter for general adult medical examination without abnormal findings: Secondary | ICD-10-CM | POA: Diagnosis present

## 2021-10-22 DIAGNOSIS — N182 Chronic kidney disease, stage 2 (mild): Secondary | ICD-10-CM

## 2021-10-22 DIAGNOSIS — M79601 Pain in right arm: Secondary | ICD-10-CM | POA: Diagnosis not present

## 2021-10-22 DIAGNOSIS — I10 Essential (primary) hypertension: Secondary | ICD-10-CM

## 2021-10-22 DIAGNOSIS — M25711 Osteophyte, right shoulder: Secondary | ICD-10-CM | POA: Diagnosis not present

## 2021-10-22 DIAGNOSIS — M19011 Primary osteoarthritis, right shoulder: Secondary | ICD-10-CM | POA: Diagnosis not present

## 2021-10-22 DIAGNOSIS — Z23 Encounter for immunization: Secondary | ICD-10-CM | POA: Diagnosis not present

## 2021-10-22 DIAGNOSIS — M50321 Other cervical disc degeneration at C4-C5 level: Secondary | ICD-10-CM | POA: Diagnosis not present

## 2021-10-22 MED ORDER — MELOXICAM 15 MG PO TABS: 15.0000 mg | ORAL_TABLET | Freq: Every day | ORAL | 0 refills | Status: DC

## 2021-10-22 MED ORDER — SPIRONOLACTONE 25 MG PO TABS: 50.0000 mg | ORAL_TABLET | Freq: Every day | ORAL | 1 refills | Status: DC

## 2021-10-22 NOTE — Patient Instructions (Addendum)
It was wonderful to see you today. Thank you for allowing me to be a part of your care. Below is a short summary of what we discussed at your visit today:  Hypertension Today we got a lab test to check your kidney function on your new blood pressure medication. If the results are normal, I will send you a letter or MyChart message. If the results are abnormal, I will give you a call.    Continue amlodipine and spironolactone.  Increase your spironolactone from 25 mg to 50 mg a day.  This means you will now take 2 of your spironolactone tablets in a day.  Come back in 2 weeks for follow-up.  Please keep a diary of your blood pressures taken every other day or so and bring them back at your next visit.  Right arm pain I am sending you for x-rays of your right shoulder and your cervical spine (neck).  Please go to the [facility] imaging center unless [employer].  Directions below.  You may walk-in anytime for this.  I have also sent you a prescription for meloxicam.  We will do a 2-week trial.  This is an anti-inflammatory that should help calm down right arm pain.  [facility] [address] [place], [place], [place] [address] [phone] Mon-Fri 8-5      Please bring all of your medications to every appointment!  If you have any questions or concerns, please do not hesitate to contact us via phone or MyChart message.   Fayette Pho, MD

## 2021-10-22 NOTE — Assessment & Plan Note (Signed)
Due for Tdap, patient received today.  Tolerated well, no adverse side effects.

## 2021-10-22 NOTE — Assessment & Plan Note (Signed)
Acute, 2-week duration.  No red flag symptoms.  Consider differentials include rotator cuff injury, AC joint arthritis, cervical impingement.  Trial meloxicam daily.  Ordered complete shoulder and cervical spine x-ray, will follow-up results.  If unremarkable, will recommend PT, stretches, topicals.

## 2021-10-22 NOTE — Assessment & Plan Note (Addendum)
Not at goal.  Tolerating medications well.  No red flag symptoms.  Collected BMP today for kidney function after starting spironolactone.  Discussed with patient, increase spironolactone.  Return in 2 weeks.

## 2021-10-22 NOTE — Progress Notes (Signed)
SUBJECTIVE:   CHIEF COMPLAINT / HPI:   HTN follow up - Last seen 10/07/2021, provider notes below HYPERTENSION, BENIGN SYSTEMIC Chronic, uncontrolled. BP today 148/90. No red flag symptoms. History of intolerances to HCTZ, ACEs, ARBs. Continue amlodipine. Start spironolactone. Return for recheck and Cr check in two weeks. See AVS for more.  - BP today continues to be elevated - Tolerating medications well, no orthostatic hypotension  Right arm pain -2-week duration of right shoulder and arm pain - Went to the ED, visit notes below, negative DVT study of RUE, no x-ray - Reports sharp pain radiating from 2 inches distal from elbow up to shoulder and neck - Describes as sharp pain - No weakness of the hand - Inability to raise arm up past 90 degrees - No known traumas or injuries recently  ED 10/12/21, provider notes below "Tamara Church is a 46 y.o. female with medical history significant for GERD, hypertension, palpitations.  Patient states that for the last 1 week she has had right arm pain that comes and goes.  She describes the pain as a "soreness, aching".  Patient states that the pain is relieved by propping up her arm and denies any aggravating factors.  Patient has attempted to alleviate pain utilizing ibuprofen with no relief.  Patient denies any preceding trauma to the arm.  The patient saw her PCP this past Tuesday but did not mention her arm pain.  Patient endorses right arm pain.  Patient denies numbness, tingling of right arm, chest pain, shortness of breath, abdominal pain, fevers."  PERTINENT  PMH / PSH:  Patient Active Problem List   Diagnosis Date Noted   Right arm pain 10/22/2021   Prediabetes 10/08/2021   Colon cancer screening 07/05/2021   Prolonged QT interval 10/04/2020   Adjustment disorder with depressed mood 03/15/2020   Seasonal allergies 02/08/2020   Decreased hearing 12/15/2019   Healthcare maintenance 07/14/2019   CKD (chronic kidney disease) stage  2, GFR 60-89 ml/min 03/21/2012   GERD 09/04/2008   OBESITY, NOS 11/18/2006   HYPERTENSION, BENIGN SYSTEMIC 11/18/2006     OBJECTIVE:   BP (!) 153/99    Pulse 93    Ht 5\' 2"  (1.575 m)    Wt 213 lb 12.8 oz (97 kg)    LMP 10/05/2021    SpO2 100%    BMI 39.10 kg/m    PHQ-9:  Depression screen [facility] 2/9 10/22/2021 10/08/2021 10/08/2021  Decreased Interest 0 0 0  Down, Depressed, Hopeless 0 0 0  PHQ - 2 Score 0 0 0  Altered sleeping 0 0 -  Tired, decreased energy 0 0 -  Change in appetite 0 0 -  Feeling bad or failure about yourself  0 0 -  Trouble concentrating 0 0 -  Moving slowly or fidgety/restless 0 0 -  Suicidal thoughts 0 0 -  PHQ-9 Score 0 0 -  Difficult doing work/chores - - -  Some recent data might be hidden     GAD-7: No flowsheet data found.  Physical Exam General: Awake, alert, oriented Cardiovascular: Regular rate and rhythm, S1 and S2 present, no murmurs auscultated Respiratory: Lung fields clear to auscultation bilaterally MSK: Patient unable to raise right arm past 90 degrees, assisted elevation past 90 degrees limited by pain, pain with abduction, pain with resisted internal and external rotation   ASSESSMENT/PLAN:   HYPERTENSION, BENIGN SYSTEMIC Not at goal.  Tolerating medications well.  No red flag symptoms.  Collected BMP today for kidney  function after starting spironolactone.  Discussed with patient, increase spironolactone.  Return in 2 weeks.  Right arm pain Acute, 2-week duration.  No red flag symptoms.  Consider differentials include rotator cuff injury, AC joint arthritis, cervical impingement.  Trial meloxicam daily.  Ordered complete shoulder and cervical spine x-ray, will follow-up results.  If unremarkable, will recommend PT, stretches, topicals.  Healthcare maintenance Due for Tdap, patient received today.  Tolerated well, no adverse side effects.     Ezequiel Essex, MD [place]

## 2021-10-23 ENCOUNTER — Encounter: Payer: Self-pay | Admitting: Family Medicine

## 2021-10-23 LAB — BASIC METABOLIC PANEL
BUN/Creatinine Ratio: 12 (ref 9–23)
BUN: 12 mg/dL (ref 6–24)
CO2: 24 mmol/L (ref 20–29)
Calcium: 9.9 mg/dL (ref 8.7–10.2)
Chloride: 101 mmol/L (ref 96–106)
Creatinine, Ser: 0.97 mg/dL (ref 0.57–1.00)
Glucose: 99 mg/dL (ref 70–99)
Potassium: 4.5 mmol/L (ref 3.5–5.2)
Sodium: 139 mmol/L (ref 134–144)
eGFR: 74 mL/min/{1.73_m2} (ref >59)

## 2021-10-24 ENCOUNTER — Telehealth: Payer: Self-pay

## 2021-10-24 DIAGNOSIS — M79601 Pain in right arm: Secondary | ICD-10-CM

## 2021-10-24 DIAGNOSIS — M7581 Other shoulder lesions, right shoulder: Secondary | ICD-10-CM

## 2021-10-24 DIAGNOSIS — M5030 Other cervical disc degeneration, unspecified cervical region: Secondary | ICD-10-CM

## 2021-10-24 MED ORDER — DICLOFENAC SODIUM 1 % EX GEL: 2.0000 g | Freq: Four times a day (QID) | CUTANEOUS | 2 refills | Status: DC

## 2021-10-24 MED ORDER — TRAMADOL HCL 50 MG PO TABS: 50.0000 mg | ORAL_TABLET | Freq: Four times a day (QID) | ORAL | 0 refills | Status: AC | PRN

## 2021-10-24 NOTE — Telephone Encounter (Signed)
Patient calls nurse line requesting results of imaging and next steps. Patient reports she still is in a lot of pain.  Will forward to PCP.

## 2021-10-30 ENCOUNTER — Encounter: Payer: Self-pay | Admitting: Surgery

## 2021-10-30 ENCOUNTER — Ambulatory Visit (INDEPENDENT_AMBULATORY_CARE_PROVIDER_SITE_OTHER): Payer: Medicaid Other | Admitting: Surgery

## 2021-10-30 ENCOUNTER — Other Ambulatory Visit: Payer: Self-pay

## 2021-10-30 VITALS — BP 139/95 | HR 97 | Ht 62.0 in | Wt 213.8 lb

## 2021-10-30 DIAGNOSIS — M7541 Impingement syndrome of right shoulder: Secondary | ICD-10-CM | POA: Diagnosis not present

## 2021-10-30 MED ORDER — BUPIVACAINE HCL 0.25 % IJ SOLN
6.0000 mL | INTRAMUSCULAR | Status: AC | PRN
  Administered 2021-10-30: 6 mL via INTRA_ARTICULAR

## 2021-10-30 MED ORDER — METHYLPREDNISOLONE ACETATE 40 MG/ML IJ SUSP
40.0000 mg | INTRAMUSCULAR | Status: AC | PRN
  Administered 2021-10-30: 40 mg via INTRA_ARTICULAR

## 2021-10-30 MED ORDER — LIDOCAINE HCL 1 % IJ SOLN
3.0000 mL | INTRAMUSCULAR | Status: AC | PRN
  Administered 2021-10-30: 3 mL

## 2021-10-30 NOTE — Progress Notes (Addendum)
Office Visit Note   Patient: Tamara Church           Date of Birth: [birth year]           MRN: [id number] Visit Date: 10/30/2021              Requested by: Lenoria Chime, [place],  [place] [address] PCP: Ezequiel Essex, MD   Assessment & Plan: Visit Diagnoses:  1. Impingement syndrome of right shoulder     Plan: In hopes of giving patient improvement of her shoulder pain offered conservative treatment with injection.  After patient consent right shoulder was prepped with Betadine and subacromial Marcaine/Depo-Medrol injection performed.  After sitting for few minutes patient reported complete relief of her pain and had full range of motion all planes.  I will have patient follow-up with me in 3 weeks for recheck.  If her pain returns I will plan to get an MRI to rule out rotator cuff tear and other shoulder pathology.  Ice the shoulder off-and-on tonight.  No aggressive activity.  All questions answered.  Follow-Up Instructions: Return in about 3 weeks (around 11/20/2021) for With [facility] recheck right shoulder after injection.   Orders:  No orders of the defined types were placed in this encounter.  No orders of the defined types were placed in this encounter.     Procedures: Large Joint Inj: R subacromial bursa on 10/30/2021 3:35 PM Indications: pain Details: 25 G 1.5 in needle, posterior approach Medications: 3 mL lidocaine 1 %; 6 mL bupivacaine 0.25 %; 40 mg methylPREDNISolone acetate 40 MG/ML Consent was given by the patient. Patient was prepped and draped in the usual sterile fashion.      Clinical Data: No additional findings.   Subjective: Chief Complaint  Patient presents with   Right Shoulder - Pain   Neck - Pain    HPI 46 year old black female who is new patient to clinic comes in today with complaints of worsening right shoulder pain.  States that pain ongoing for couple weeks.  No injury.  States that she woke up one morning and had pain  in her shoulder.  Difficulty raising overhead and reach behind her back.  She has had some posterior neck pain but no true cervical radicular component.  She was seen by her PCP and has tried Mobic, tramadol and Tylenol with very minimal improvement.  States that she is still unable to raise her arm above her head.  No issues on the left side.  Denies family history of inflammatory arthropathy.  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 No known injury.   EXAM: RIGHT SHOULDER - 2+ VIEW   COMPARISON:  None.   FINDINGS: Minimal degenerative spurring of the acromioclavicular joint. No AC joint widening. Tiny subacromial spur. Normal glenohumeral joint. No fracture. Normal alignment. No fracture, erosion, avascular necrosis or focal bone abnormality. No soft tissue calcifications.   IMPRESSION: Minimal degenerative spurring of the acromioclavicular joint. Tiny subacromial spur.     Electronically Signed   By: Keith Rake M.D.   CLINICAL DATA:  Right arm pain. Radiating shoulder pain down right arm.   EXAM: CERVICAL SPINE - COMPLETE 4+ VIEW   COMPARISON:  Cervical spine radiograph 04/15/2017   FINDINGS: Straightening of normal lordosis, similar to prior exam. Normal vertebral body heights. Disc space narrowing and endplate spurring  at C4-C5 and C5-C6, slight progression from 2018 exam. Minor facet hypertrophy, more prominent on the right at C4-C5. No bony neural foraminal narrowing. No evidence of fracture, bony destruction or focal bone lesion. 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 No prevertebral soft tissue thickening.   IMPRESSION: 1. Slight progression in degenerative disc disease at C4-C5 and C5-C6 since 2018. 2. Mild right-sided facet hypertrophy, most prominent at C4-C5. 3. Small right cervical rib.      Electronically Signed   By: Keith Rake M.D.   Review of Systems No current cardiac pulmonary GI GU issues  Objective: Vital Signs: BP (!) 139/95    Pulse 97    Ht 5\' 2"  (1.575 m)    Wt 213 lb 12.8 oz (97 kg)    LMP 10/05/2021    BMI 39.10 kg/m   Physical Exam HENT:     Head: Normocephalic and atraumatic.  Eyes:     Extraocular Movements: Extraocular movements intact.  Pulmonary:     Effort: No respiratory distress.  Musculoskeletal:     Comments: Cervical spine unremarkable.  Right shoulder passive flexion/abduction to about 80 degrees with pain.  Passively I can get her to about 150 to 160 degrees with pain.  Marked positive impingement test.  Pain and weakness with supraspinatus resistance.  Negative drop arm test.  Neurological:     Mental Status: She is alert and oriented to person, place, and time.  Psychiatric:        Mood and Affect: Mood normal.    Ortho Exam  Specialty Comments:  No specialty comments available.  Imaging: No results found.   PMFS History: Patient Active Problem List   Diagnosis Date Noted   Right arm pain 10/22/2021   Prediabetes 10/08/2021   Colon cancer screening 07/05/2021   Adjustment disorder with depressed mood 03/15/2020   Seasonal allergies 02/08/2020   Decreased hearing 12/15/2019   Healthcare maintenance 07/14/2019   CKD (chronic kidney disease) stage 2, GFR 60-89 ml/min 03/21/2012   GERD 09/04/2008   OBESITY, NOS 11/18/2006   HYPERTENSION, BENIGN SYSTEMIC 11/18/2006   Past Medical History:  Diagnosis Date   Abdominal pain, epigastric 07/27/2020   Anemia    ANEMIA 09/07/2008   Qualifier: Diagnosis of  By: Doreene Nest MD, Kim     Bacterial vaginosis 05/08/2020   Calculus of gallbladder without cholecystitis without obstruction 07/05/2021   Constipation 05/18/2020   Early satiety 05/18/2020   Gastritis and gastroduodenitis 07/27/2020   GERD (gastroesophageal reflux disease)    Hypertension    Non-intractable vomiting  05/18/2020   Palpitations 03/22/2020   Prolonged QT interval 10/04/2020   Screening-pulmonary TB 10/08/2021   Sore throat 09/18/2020   Unintentional weight loss 05/18/2020   UTI (urinary tract infection) 04/23/2020    Family History  Problem Relation Age of Onset   Hypertension Mother    Colon cancer Neg Hx    Esophageal cancer Neg Hx    Rectal cancer Neg Hx    Stomach cancer Neg Hx    Inflammatory bowel disease Neg Hx    Liver disease Neg Hx    Pancreatic cancer Neg Hx     Past Surgical History:  Procedure Laterality Date   NO PAST SURGERIES     Social History   Occupational History   Not on file  Tobacco Use   Smoking status: Never   Smokeless tobacco: Never  Vaping Use   Vaping Use: Never used  Substance and Sexual Activity   Alcohol use: No  Drug use: No   Sexual activity: Yes    Birth control/protection: I.U.D.    Comment: pt would like to start depo provera today

## 2021-11-20 ENCOUNTER — Ambulatory Visit: Payer: Medicaid Other | Admitting: Surgery

## 2021-11-21 ENCOUNTER — Other Ambulatory Visit: Payer: Self-pay | Admitting: Surgery

## 2021-11-21 DIAGNOSIS — M7541 Impingement syndrome of right shoulder: Secondary | ICD-10-CM

## 2021-12-06 ENCOUNTER — Ambulatory Visit
Admission: RE | Admit: 2021-12-06 | Discharge: 2021-12-06 | Disposition: A | Discharge status: Home / Self Care | Payer: Medicaid Other | Source: Ambulatory Visit | Attending: Surgery | Admitting: Surgery

## 2021-12-06 ENCOUNTER — Other Ambulatory Visit: Payer: Self-pay

## 2021-12-06 DIAGNOSIS — M19011 Primary osteoarthritis, right shoulder: Secondary | ICD-10-CM | POA: Diagnosis not present

## 2021-12-06 DIAGNOSIS — M7541 Impingement syndrome of right shoulder: Secondary | ICD-10-CM

## 2021-12-15 NOTE — Progress Notes (Deleted)
?Cardiology Office Note:   ? ?Date:  12/15/2021  ? ?ID:  Tamara Church, DOB May 24, 1976, MRN [id number] ? ?PCP:  Ezequiel Essex, MD  ?[facility] Cardiologist:  Rudean Haskell MD ?[facility] Electrophysiologist:  None  ? ?CC: Long QTS ? ?History of Present Illness:   ? ?Tamara Church is a 46 y.o. female with a hx of HTN, anemia, with recent viral prodrome who presents for evaluation 10/04/20 with a 08/3020 QTc of 600.  ?In 2022: had heart monitor WNL and normal ECGs. ?No events in 2023. ? ?Patient notes that she is doing ***.   ?Since day prior/last visit notes *** . ?There are no*** interval hospital/ED visit.   ? ?No chest pain or pressure ***.  No SOB/DOE*** and no PND/Orthopnea***.  No weight gain or leg swelling***.  No palpitations or syncope ***. ? ?Ambulatory blood pressure ***. ? ? ? ?Past Medical History:  ?Diagnosis Date  ? Abdominal pain, epigastric 07/27/2020  ? Anemia   ? ANEMIA 09/07/2008  ? Qualifier: Diagnosis of  By: Doreene Nest MD, Kim    ? Bacterial vaginosis 05/08/2020  ? Calculus of gallbladder without cholecystitis without obstruction 07/05/2021  ? Constipation 05/18/2020  ? Early satiety 05/18/2020  ? Gastritis and gastroduodenitis 07/27/2020  ? GERD (gastroesophageal reflux disease)   ? Hypertension   ? Non-intractable vomiting 05/18/2020  ? Palpitations 03/22/2020  ? Prolonged QT interval 10/04/2020  ? Screening-pulmonary TB 10/08/2021  ? Sore throat 09/18/2020  ? Unintentional weight loss 05/18/2020  ? UTI (urinary tract infection) 04/23/2020  ? ? ?Past Surgical History:  ?Procedure Laterality Date  ? NO PAST SURGERIES    ? ? ?Current Medications: ?No outpatient medications have been marked as taking for the 12/17/21 encounter (Appointment) with Werner Lean, MD.  ?  ? ?Allergies:   Ace inhibitors, Angiotensin receptor blockers, and Hctz [hydrochlorothiazide]  ? ?Social History  ? ?Socioeconomic History  ? Marital status: Single  ?  Spouse name: Not on file  ? Number of children:  Not on file  ? Years of education: Not on file  ? Highest education level: Not on file  ?Occupational History  ? Not on file  ?Tobacco Use  ? Smoking status: Never  ? Smokeless tobacco: Never  ?Vaping Use  ? Vaping Use: Never used  ?Substance and Sexual Activity  ? Alcohol use: No  ? Drug use: No  ? Sexual activity: Yes  ?  Birth control/protection: I.U.D.  ?  Comment: pt would like to start depo provera today  ?Other Topics Concern  ? Not on file  ?Social History Narrative  ? Not on file  ? ?Social Determinants of Health  ? ?Financial Resource Strain: Not on file  ?Food Insecurity: Not on file  ?Transportation Needs: Not on file  ?Physical Activity: Not on file  ?Stress: Not on file  ?Social Connections: Not on file  ?  ?Son Died in Police [place] January [id number] ? ?Family History: ?The patient's family history includes Hypertension in her mother. There is no history of Colon cancer, Esophageal cancer, Rectal cancer, Stomach cancer, Inflammatory bowel disease, Liver disease, or Pancreatic cancer. ?History of coronary artery disease notable for no members. ?History of heart failure notable for no members. ?No history of cardiomyopathies including hypertrophic cardiomyopathy, left ventricular non-compaction, or arrhythmogenic right ventricular cardiomyopathy. ?History of arrhythmia notable for no members. ?Denies family history of sudden cardiac death including drowning,  or unexplained deaths in the family.  Son Died in [place], but  was being chased by police.  No syncope in family. ?No history of bicuspid aortic valve or aortic aneurysm or dissection. ? ?ROS:   ?Please see the history of present illness.    ? All other systems reviewed and are negative. ? ?EKGs/Labs/Other Studies Reviewed:   ? ?The following studies were reviewed today: ? ?EKG:   ?12/15/21: *** ?10/04/20:  Sinus rhythm QTc 460 ?09/19/20: Sinus Tachycardia rate 124 QTC 600 ? ?Cardiac Event Monitoring: ?Date: 11/12/20 ?Results: ?Patient had a minimum  heart rate of 60 bpm, maximum heart rate of 167 bpm, and average heart rate of 87 bpm. ?Predominant underlying rhythm was sinus rhythm. ?Isolated PACs were rare (<1.0%), with rare couplets. ?Isolated PVCs were rare (<1.0%). ?No evidence of complete heart block; though first degree heart block was noted. ?Triggered and diary events associated with sinus tachycardia. ?  ?No malignant arrhythmias. ? ? ?Recent Labs: ?07/23/2021: ALT 15; Hemoglobin 14.3; Platelets 303 ?10/22/2021: BUN 12; Creatinine, Ser 0.97; Potassium 4.5; Sodium 139  ?Recent Lipid Panel ?   ?Component Value Date/Time  ? CHOL 115 09/04/2008 2002  ? TRIG 47 09/04/2008 2002  ? HDL 44 09/04/2008 2002  ? CHOLHDL 2.6 Ratio 09/04/2008 2002  ? VLDL 9 09/04/2008 2002  ? Sam [place] 62 09/04/2008 2002  ? ? ?Physical Exam:   ? ?VS:  There were no vitals taken for this visit.   ? ?Wt Readings from Last 3 Encounters:  ?10/30/21 213 lb 12.8 oz (97 kg)  ?10/22/21 213 lb 12.8 oz (97 kg)  ?10/12/21 211 lb (95.7 kg)  ?  ?Gen: *** distress, *** obese/well nourished/malnourished   ?Neck: No JVD, *** carotid bruit ?Ears: Pilar Plate Sign ?Cardiac: No Rubs or Gallops, *** Murmur, ***cardia, *** radial pulses ?Respiratory: Clear to auscultation bilaterally, *** effort, ***  respiratory rate ?GI: Soft, nontender, non-distended *** ?MS: No *** edema; *** moves all extremities ?Integument: Skin feels *** ?Neuro:  At time of evaluation, alert and oriented to person/place/time/situation *** ?Psych: Normal affect, patient feels *** ? ? ?ASSESSMENT:   ? ?No diagnosis found. ? ?PLAN:   ? ?Prolonged Qtc (non syndromic) ?- *** ? ?One year follow up unless new symptoms or abnormal test results warranting change in plan ? ?Would be reasonable for  Video Visit Follow up ?Would be reasonable for  APP Follow up ? ?Medication Adjustments/Labs and Tests Ordered: ?Current medicines are reviewed at length with the patient today.  Concerns regarding medicines are outlined above.  ?No orders of the  defined types were placed in this encounter. ? ?No orders of the defined types were placed in this encounter. ? ? ?There are no Patient Instructions on file for this visit.  ? ?Signed, ?Werner Lean, MD  ?12/15/2021 5:01 PM    ?[place] ? ?

## 2021-12-17 ENCOUNTER — Ambulatory Visit: Payer: Medicaid Other | Admitting: Internal Medicine

## 2022-01-26 ENCOUNTER — Other Ambulatory Visit: Payer: Self-pay | Admitting: Gastroenterology

## 2022-01-26 ENCOUNTER — Other Ambulatory Visit: Payer: Self-pay | Admitting: Family Medicine

## 2022-01-26 DIAGNOSIS — I10 Essential (primary) hypertension: Secondary | ICD-10-CM

## 2022-03-11 ENCOUNTER — Other Ambulatory Visit: Payer: Self-pay | Admitting: Family Medicine

## 2022-03-11 DIAGNOSIS — I10 Essential (primary) hypertension: Secondary | ICD-10-CM

## 2022-05-13 ENCOUNTER — Encounter (HOSPITAL_COMMUNITY): Payer: Self-pay

## 2022-05-13 ENCOUNTER — Emergency Department (HOSPITAL_COMMUNITY)
Admission: EM | Admit: 2022-05-13 | Discharge: 2022-05-13 | Disposition: A | Discharge status: Home / Self Care | Payer: Medicaid Other | Attending: Emergency Medicine | Admitting: Emergency Medicine

## 2022-05-13 ENCOUNTER — Other Ambulatory Visit: Payer: Self-pay

## 2022-05-13 DIAGNOSIS — I10 Essential (primary) hypertension: Secondary | ICD-10-CM | POA: Diagnosis not present

## 2022-05-13 DIAGNOSIS — R112 Nausea with vomiting, unspecified: Secondary | ICD-10-CM | POA: Diagnosis not present

## 2022-05-13 DIAGNOSIS — K219 Gastro-esophageal reflux disease without esophagitis: Secondary | ICD-10-CM | POA: Insufficient documentation

## 2022-05-13 DIAGNOSIS — Z79899 Other long term (current) drug therapy: Secondary | ICD-10-CM | POA: Insufficient documentation

## 2022-05-13 DIAGNOSIS — R110 Nausea: Secondary | ICD-10-CM | POA: Diagnosis not present

## 2022-05-13 LAB — CBC WITH DIFFERENTIAL/PLATELET
Abs Immature Granulocytes: 0.02 10*3/uL (ref 0.00–0.07)
Basophils Absolute: 0 10*3/uL (ref 0.0–0.1)
Basophils Relative: 1 %
Eosinophils Absolute: 0 10*3/uL (ref 0.0–0.5)
Eosinophils Relative: 0 %
HCT: 41.1 % (ref 36.0–46.0)
Hemoglobin: 13.3 g/dL (ref 12.0–15.0)
Immature Granulocytes: 0 %
Lymphocytes Relative: 18 %
Lymphs Abs: 1.1 10*3/uL (ref 0.7–4.0)
MCH: 28.9 pg (ref 26.0–34.0)
MCHC: 32.4 g/dL (ref 30.0–36.0)
MCV: 89.2 fL (ref 80.0–100.0)
Monocytes Absolute: 0.2 10*3/uL (ref 0.1–1.0)
Monocytes Relative: 4 %
Neutro Abs: 4.8 10*3/uL (ref 1.7–7.7)
Neutrophils Relative %: 77 %
Platelets: 299 10*3/uL (ref 150–400)
RBC: 4.61 MIL/uL (ref 3.87–5.11)
RDW: 13.3 % (ref 11.5–15.5)
WBC: 6.3 10*3/uL (ref 4.0–10.5)
nRBC: 0 % (ref 0.0–0.2)

## 2022-05-13 LAB — TROPONIN I (HIGH SENSITIVITY)
Troponin I (High Sensitivity): 3 ng/L (ref <18)
Troponin I (High Sensitivity): 3 ng/L (ref <18)

## 2022-05-13 LAB — COMPREHENSIVE METABOLIC PANEL
ALT: 15 U/L (ref 0–44)
AST: 17 U/L (ref 15–41)
Albumin: 4.5 g/dL (ref 3.5–5.0)
Alkaline Phosphatase: 56 U/L (ref 38–126)
Anion gap: 10 (ref 5–15)
BUN: 12 mg/dL (ref 6–20)
CO2: 24 mmol/L (ref 22–32)
Calcium: 9.8 mg/dL (ref 8.9–10.3)
Chloride: 104 mmol/L (ref 98–111)
Creatinine, Ser: 0.92 mg/dL (ref 0.44–1.00)
GFR, Estimated: >60 mL/min (ref >60)
Glucose, Bld: 128 mg/dL — ABNORMAL HIGH (ref 70–99)
Potassium: 3.9 mmol/L (ref 3.5–5.1)
Sodium: 138 mmol/L (ref 135–145)
Total Bilirubin: 0.5 mg/dL (ref 0.3–1.2)
Total Protein: 9.2 g/dL — ABNORMAL HIGH (ref 6.5–8.1)

## 2022-05-13 LAB — URINALYSIS, ROUTINE W REFLEX MICROSCOPIC
Bacteria, UA: NONE SEEN
Bilirubin Urine: NEGATIVE
Glucose, UA: NEGATIVE mg/dL
Ketones, ur: 5 mg/dL — AB
Leukocytes,Ua: NEGATIVE
Nitrite: NEGATIVE
Protein, ur: NEGATIVE mg/dL
Specific Gravity, Urine: 1.004 — ABNORMAL LOW (ref 1.005–1.030)
pH: 6 (ref 5.0–8.0)

## 2022-05-13 LAB — LIPASE, BLOOD: Lipase: 27 U/L (ref 11–51)

## 2022-05-13 MED ORDER — PANTOPRAZOLE SODIUM 40 MG IV SOLR
40.0000 mg | Freq: Once | INTRAVENOUS | Status: AC
  Administered 2022-05-13: 40 mg via INTRAVENOUS
  Filled 2022-05-13: qty 10

## 2022-05-13 MED ORDER — SODIUM CHLORIDE 0.9 % IV BOLUS
1000.0000 mL | Freq: Once | INTRAVENOUS | Status: AC
  Administered 2022-05-13: 1000 mL via INTRAVENOUS

## 2022-05-13 MED ORDER — ONDANSETRON HCL 4 MG PO TABS: 4.0000 mg | ORAL_TABLET | Freq: Three times a day (TID) | ORAL | 0 refills | Status: DC | PRN

## 2022-05-13 MED ORDER — FAMOTIDINE IN NACL 20-0.9 MG/50ML-% IV SOLN
20.0000 mg | Freq: Once | INTRAVENOUS | Status: AC
  Administered 2022-05-13: 20 mg via INTRAVENOUS
  Filled 2022-05-13: qty 50

## 2022-05-13 MED ORDER — ACETAMINOPHEN 325 MG PO TABS
650.0000 mg | ORAL_TABLET | Freq: Once | ORAL | Status: AC
  Administered 2022-05-13: 650 mg via ORAL
  Filled 2022-05-13: qty 2

## 2022-05-13 NOTE — Discharge Instructions (Addendum)
You were seen today for nausea and vomiting.  Your work-up was reassuring for no signs of infection. Your EKG and lab work were reassuring for no signs of acute coronary syndrome. This is likely a worsening of your reflux disease. I recommend follow up with your gastroenterologist. I suggest trying Pepcid in addition to your omeprazole. I have provided a prescription for nausea medication to be taken as needed.

## 2022-05-13 NOTE — ED Provider Notes (Signed)
[facility]-EMERGENCY DEPT Provider Note   CSN: [id number] Arrival date & time: 05/13/22  3007     History  Chief Complaint  Patient presents with   Nausea   Gastroesophageal Reflux    Tamara Church is a 46 y.o. female.  Patient presents to the emergency department complaining of nausea and vomiting which began yesterday.  She states that she has had similar nausea and vomiting when she has flares of her acid reflux disease and took her home medication yesterday with mild relief.  She states this morning she woke up and drink water prior to going to work but felt like she may pass out once arriving at work.  She does endorse mild chest tightness in the center of her chest this morning feels this may be due to the reflux and due to belching.  Patient denies shortness of breath, abdominal pain, diarrhea, dysuria.  Patient's past medical history significant for epigastric abdominal pain, GERD, gastritis and gastroduodenitis, anemia, hypertension, calculus of gallbladder, palpitations  HPI     Home Medications Prior to Admission medications   Medication Sig Start Date End Date Taking? Authorizing Provider  ondansetron (ZOFRAN) 4 MG tablet Take 1 tablet (4 mg total) by mouth every 8 (eight) hours as needed for nausea or vomiting. 05/13/22  Yes Barrie Dunker B, PA-C  amLODipine (NORVASC) 10 MG tablet TAKE 1 TABLET BY MOUTH AT BEDTIME. PLEASE RETURN TO THE CLINIC FOR BP CHECK JAN 2023 01/27/22   Fayette Pho, MD  diclofenac Sodium (VOLTAREN) 1 % GEL Apply 2 g topically 4 (four) times daily. To shoulder 10/24/21   Fayette Pho, MD  esomeprazole (NEXIUM) 40 MG capsule TAKE 1 CAPSULE BY MOUTH ONCE DAILY AT  12  NOON. 01/27/22   Mansouraty, Netty Starring., MD  fluticasone [facility]) 50 MCG/ACT nasal spray Place 2 sprays into both nostrils daily. 08/21/21   Domenick Gong, MD  levonorgestrel (MIRENA) 20 MCG/24HR IUD 1 each by Intrauterine route once.    [provider]   meloxicam (MOBIC) 15 MG tablet Take 1 tablet (15 mg total) by mouth daily. For short trial to help with right arm pain. 10/22/21   Fayette Pho, MD  spironolactone (ALDACTONE) 25 MG tablet TAKE 2 TABLETS BY MOUTH AT BEDTIME 03/11/22   Sabino Dick, DO      Allergies    Ace inhibitors, Angiotensin receptor blockers, and Hctz [hydrochlorothiazide]    Review of Systems   Review of Systems  Constitutional:  Negative for fever.  Respiratory:  Negative for shortness of breath.   Cardiovascular:  Negative for chest pain.  Gastrointestinal:  Positive for nausea and vomiting. Negative for abdominal pain, constipation and diarrhea.  Genitourinary:  Negative for dysuria.  Skin:  Negative for pallor.  Neurological:  Positive for headaches (Mild, onset after nausea and vomiting). Negative for syncope.    Physical Exam Updated Vital Signs BP (!) 147/98 (BP Location: Right Arm)   Pulse 71   Temp 97.9 F (36.6 C) (Oral)   Resp 20   Ht 5\' 1"  (1.549 m)   Wt 79.4 kg   SpO2 100%   BMI 33.07 kg/m  Physical Exam Vitals and nursing note reviewed.  Constitutional:      General: She is not in acute distress. HENT:     Head: Normocephalic and atraumatic.     Mouth/Throat:     Mouth: Mucous membranes are moist.  Eyes:     Conjunctiva/sclera: Conjunctivae normal.  Cardiovascular:     Rate  and Rhythm: Normal rate and regular rhythm.     Pulses: Normal pulses.     Heart sounds: Normal heart sounds.  Pulmonary:     Effort: Pulmonary effort is normal.     Breath sounds: Normal breath sounds.  Abdominal:     Palpations: Abdomen is soft.     Tenderness: There is no abdominal tenderness.  Musculoskeletal:        General: Normal range of motion.     Cervical back: Normal range of motion and neck supple.  Skin:    General: Skin is warm and dry.     Capillary Refill: Capillary refill takes less than 2 seconds.  Neurological:     Mental Status: She is alert and oriented to person, place,  and time.     ED Results / Procedures / Treatments   Labs (all labs ordered are listed, but only abnormal results are displayed) Labs Reviewed  COMPREHENSIVE METABOLIC PANEL - Abnormal; Notable for the following components:      Result Value   Glucose, Bld 128 (*)    Total Protein 9.2 (*)    All other components within normal limits  URINALYSIS, ROUTINE W REFLEX MICROSCOPIC - Abnormal; Notable for the following components:   Color, Urine COLORLESS (*)    Specific Gravity, Urine 1.004 (*)    Hgb urine dipstick LARGE (*)    Ketones, ur 5 (*)    All other components within normal limits  CBC WITH DIFFERENTIAL/PLATELET  LIPASE, BLOOD  TROPONIN I (HIGH SENSITIVITY)  TROPONIN I (HIGH SENSITIVITY)    EKG EKG Interpretation  Date/Time:  Wednesday May 13 2022 08:39:57 EDT Ventricular Rate:  87 PR Interval:  160 QRS Duration: 97 QT Interval:  366 QTC Calculation: 441 R Axis:   13 Text Interpretation: Sinus rhythm Low voltage, precordial leads Consider anterior infarct Baseline wander in lead(s) I III aVL Confirmed by Glyn Ade [phone]) on 05/13/2022 1:00:07 PM  Radiology No results found.  Procedures Procedures    Medications Ordered in ED Medications  pantoprazole (PROTONIX) injection 40 mg (40 mg Intravenous Given 05/13/22 1136)  famotidine (PEPCID) IVPB 20 mg premix (20 mg Intravenous New Bag/Given 05/13/22 1137)  acetaminophen (TYLENOL) tablet 650 mg (650 mg Oral Given 05/13/22 1136)  sodium chloride 0.9 % bolus 1,000 mL (1,000 mLs Intravenous New Bag/Given 05/13/22 1137)    ED Course/ Medical Decision Making/ A&P                           Medical Decision Making Amount and/or Complexity of Data Reviewed Labs: ordered.  Risk OTC drugs. Prescription drug management.   This patient presents to the ED for concern of nausea and vomiting, this involves an extensive number of treatment options, and is a complaint that carries with it a high risk of  complications and morbidity.  The differential diagnosis includes GERD, gastroenteritis, gastritis, gastroduodenitis, ACS, and others   Co morbidities that complicate the patient evaluation  History of gastritis and gastroenteritis, GERD   Additional history obtained:  Additional history obtained from EMS External records from outside source obtained and reviewed including family medicine visits for hypertension   Lab Tests:  I Ordered, and personally interpreted labs.  The pertinent results include: Troponin 3, lipase 27, glucose 128, unremarkable urinalysis, grossly normal CBC   Imaging Studies ordered:  I considered abdominal imaging but with the patient's lack of abdominal pain at this time decided against that as I felt they  would have little clinical value   Cardiac Monitoring: / EKG:  The patient was maintained on a cardiac monitor.  I personally viewed and interpreted the cardiac monitored which showed an underlying rhythm of: Sinus rhythm    Problem List / ED Course / Critical interventions / Medication management   I ordered medication including Tylenol for headache, Pepcid and Protonix for reflux Reevaluation of the patient after these medicines showed that the patient improved I have reviewed the patients home medicines and have made adjustments as needed  Test / Admission - Considered:  The patient feels significantly better after IV Pepcid and Protonix.  No abdominal tenderness to suggest cholecystitis, appendicitis.  Lipase within normal limits suggesting no pancreatitis.  EKG with no ischemic changes and negative troponins showing no signs of ACS at this time.  The patient previously had GERD flare which caused very similar symptoms.  I believe she is having a repeat flare at this time.  Recommend that she add Pepcid to her daily medications.  I have provided the Zofran prescription.  Vitals are normal.  Patient is stable for discharge home and follow-up with  her gastroenterologist.  Patient agrees with the plan        Final Clinical Impression(s) / ED Diagnoses Final diagnoses:  Nausea and vomiting, unspecified vomiting type  Gastroesophageal reflux disease, unspecified whether esophagitis present    Rx / DC Orders ED Discharge Orders          Ordered    ondansetron (ZOFRAN) 4 MG tablet  Every 8 hours PRN        05/13/22 1300              Pamala Duffel 05/13/22 1301    Glyn Ade, MD 05/13/22 1313

## 2022-05-13 NOTE — ED Triage Notes (Signed)
Pt BIB EMS from work. Pt reports severe acid reflux, nausea over the past few days. Hx of acid reflux and haas been taking medication. Pt went into work today and reported feeling like she was going to pass out.   HR 90 BP 150/100 (takes BP meds) CBG 163

## 2022-05-13 NOTE — ED Provider Triage Note (Signed)
Emergency Medicine Provider Triage Evaluation Note  Tamara Church , a 46 y.o. female  was evaluated in triage.  Pt complains of 2 days of nausea.  Patient states that she feels like she has to belch a lot and has some tightness in the center of her chest.  She states that she has had previous symptoms when her gastric reflux disease flared up years ago.  She denies vomiting at this time.  Denies shortness of breath.  Patient states she tried taking some of her acid reflux medication yesterday at home with little relief.  She states she drank water this morning to try to go to work but while at work continue to have dry heaves.  Staff at work stated that the patient may have had a syncopal episode but this is unclear.  Review of Systems  Positive: Nausea, chest tightness Negative: Shortness of breath  Physical Exam  There were no vitals taken for this visit. Gen:   Awake, no distress   Resp:  Normal effort  MSK:   Moves extremities without difficulty  Other:    Medical Decision Making  Medically screening exam initiated at 8:34 AM.  Appropriate orders placed.  [name] was informed that the remainder of the evaluation will be completed by another provider, this initial triage assessment does not replace that evaluation, and the importance of remaining in the ED until their evaluation is complete.     Darrick Grinder, [place] 05/13/22 [phone]

## 2022-05-24 ENCOUNTER — Other Ambulatory Visit: Payer: Self-pay | Admitting: Gastroenterology

## 2022-06-09 DIAGNOSIS — H5213 Myopia, bilateral: Secondary | ICD-10-CM | POA: Diagnosis not present

## 2022-06-18 ENCOUNTER — Encounter (HOSPITAL_BASED_OUTPATIENT_CLINIC_OR_DEPARTMENT_OTHER): Payer: Self-pay | Admitting: Emergency Medicine

## 2022-06-18 ENCOUNTER — Emergency Department (HOSPITAL_BASED_OUTPATIENT_CLINIC_OR_DEPARTMENT_OTHER)
Admission: EM | Admit: 2022-06-18 | Discharge: 2022-06-18 | Disposition: A | Discharge status: Home / Self Care | Payer: Medicaid Other | Attending: Emergency Medicine | Admitting: Emergency Medicine

## 2022-06-18 ENCOUNTER — Other Ambulatory Visit: Payer: Self-pay

## 2022-06-18 DIAGNOSIS — Z79899 Other long term (current) drug therapy: Secondary | ICD-10-CM | POA: Insufficient documentation

## 2022-06-18 DIAGNOSIS — R112 Nausea with vomiting, unspecified: Secondary | ICD-10-CM | POA: Insufficient documentation

## 2022-06-18 DIAGNOSIS — I129 Hypertensive chronic kidney disease with stage 1 through stage 4 chronic kidney disease, or unspecified chronic kidney disease: Secondary | ICD-10-CM | POA: Diagnosis not present

## 2022-06-18 DIAGNOSIS — R197 Diarrhea, unspecified: Secondary | ICD-10-CM | POA: Diagnosis not present

## 2022-06-18 DIAGNOSIS — R531 Weakness: Secondary | ICD-10-CM | POA: Diagnosis not present

## 2022-06-18 DIAGNOSIS — N182 Chronic kidney disease, stage 2 (mild): Secondary | ICD-10-CM | POA: Insufficient documentation

## 2022-06-18 DIAGNOSIS — N390 Urinary tract infection, site not specified: Secondary | ICD-10-CM | POA: Diagnosis not present

## 2022-06-18 LAB — CBC
HCT: 38.6 % (ref 36.0–46.0)
Hemoglobin: 12.7 g/dL (ref 12.0–15.0)
MCH: 29 pg (ref 26.0–34.0)
MCHC: 32.9 g/dL (ref 30.0–36.0)
MCV: 88.1 fL (ref 80.0–100.0)
Platelets: 313 10*3/uL (ref 150–400)
RBC: 4.38 MIL/uL (ref 3.87–5.11)
RDW: 13.4 % (ref 11.5–15.5)
WBC: 8.2 10*3/uL (ref 4.0–10.5)
nRBC: 0 % (ref 0.0–0.2)

## 2022-06-18 LAB — BASIC METABOLIC PANEL
Anion gap: 10 (ref 5–15)
BUN: 12 mg/dL (ref 6–20)
CO2: 25 mmol/L (ref 22–32)
Calcium: 10 mg/dL (ref 8.9–10.3)
Chloride: 97 mmol/L — ABNORMAL LOW (ref 98–111)
Creatinine, Ser: 0.83 mg/dL (ref 0.44–1.00)
GFR, Estimated: >60 mL/min (ref >60)
Glucose, Bld: 122 mg/dL — ABNORMAL HIGH (ref 70–99)
Potassium: 4.4 mmol/L (ref 3.5–5.1)
Sodium: 132 mmol/L — ABNORMAL LOW (ref 135–145)

## 2022-06-18 LAB — URINALYSIS, ROUTINE W REFLEX MICROSCOPIC
Bilirubin Urine: NEGATIVE
Glucose, UA: NEGATIVE mg/dL
Hgb urine dipstick: NEGATIVE
Ketones, ur: 15 mg/dL — AB
Leukocytes,Ua: NEGATIVE
Nitrite: NEGATIVE
Protein, ur: NEGATIVE mg/dL
Specific Gravity, Urine: 1.006 (ref 1.005–1.030)
pH: 6.5 (ref 5.0–8.0)

## 2022-06-18 LAB — LIPASE, BLOOD: Lipase: 13 U/L (ref 11–51)

## 2022-06-18 LAB — PREGNANCY, URINE: Preg Test, Ur: NEGATIVE

## 2022-06-18 MED ORDER — FAMOTIDINE IN NACL 20-0.9 MG/50ML-% IV SOLN
20.0000 mg | Freq: Once | INTRAVENOUS | Status: AC
  Administered 2022-06-18: 20 mg via INTRAVENOUS
  Filled 2022-06-18: qty 50

## 2022-06-18 MED ORDER — SODIUM CHLORIDE 0.9 % IV BOLUS
1000.0000 mL | Freq: Once | INTRAVENOUS | Status: AC
  Administered 2022-06-18: 1000 mL via INTRAVENOUS

## 2022-06-18 MED ORDER — LOPERAMIDE HCL 2 MG PO CAPS: 2.0000 mg | ORAL_CAPSULE | Freq: Four times a day (QID) | ORAL | 0 refills | Status: DC | PRN

## 2022-06-18 MED ORDER — LIDOCAINE VISCOUS HCL 2 % MT SOLN
15.0000 mL | Freq: Once | OROMUCOSAL | Status: AC
  Administered 2022-06-18: 15 mL via ORAL
  Filled 2022-06-18: qty 15

## 2022-06-18 MED ORDER — ONDANSETRON HCL 4 MG PO TABS: 4.0000 mg | ORAL_TABLET | Freq: Four times a day (QID) | ORAL | 0 refills | Status: DC

## 2022-06-18 MED ORDER — ALUM & MAG HYDROXIDE-SIMETH 200-200-20 MG/5ML PO SUSP
30.0000 mL | Freq: Once | ORAL | Status: AC
  Administered 2022-06-18: 30 mL via ORAL
  Filled 2022-06-18: qty 30

## 2022-06-18 MED ORDER — ONDANSETRON HCL 4 MG/2ML IJ SOLN
4.0000 mg | Freq: Once | INTRAMUSCULAR | Status: AC
  Administered 2022-06-18: 4 mg via INTRAVENOUS
  Filled 2022-06-18: qty 2

## 2022-06-18 NOTE — Discharge Instructions (Addendum)
We evaluated you in the emergency department for your nausea, vomiting, and diarrhea.  Your symptoms improved in the emergency department with treatment.  Your symptoms are most likely due to a viral illness.  I have prescribed you medication for nausea, diarrhea.  Please take the Zofran every 6 hours as needed for nausea.  Please take Imodium 4 times a day as needed for diarrhea.   return to the emergency department if you have persistent vomiting, inability to tolerate food and liquids, blood in your stool, severe abdominal pain, lightheadedness, fainting, or any other concerning symptoms.

## 2022-06-18 NOTE — ED Provider Notes (Signed)
[facility] EMERGENCY DEPT Provider Note  CSN: [id number] Arrival date & time: 06/18/22 [id number]  Chief Complaint(s) Weakness  HPI Tamara Church is a 46 y.o. female with history of GERD, gastritis presenting to the emergency department with generalized weakness.  Patient reports diarrheal illness since Monday, no blood in her stool, recent travel, recent antibiotic use.  She reports this morning she developed some upper abdominal pain, associated with nausea.  No vomiting.  No fevers or chills.  No dysuria.  She reports the upper abdominal pain feels like prior episodes of GERD.  Symptoms are mild.   Past Medical History Past Medical History:  Diagnosis Date   Abdominal pain, epigastric 07/27/2020   Anemia    ANEMIA 09/07/2008   Qualifier: Diagnosis of  By: Doreene Nest MD, Kim     Bacterial vaginosis 05/08/2020   Calculus of gallbladder without cholecystitis without obstruction 07/05/2021   Constipation 05/18/2020   Early satiety 05/18/2020   Gastritis and gastroduodenitis 07/27/2020   GERD (gastroesophageal reflux disease)    Hypertension    Non-intractable vomiting 05/18/2020   Palpitations 03/22/2020   Prolonged QT interval 10/04/2020   Screening-pulmonary TB 10/08/2021   Sore throat 09/18/2020   Unintentional weight loss 05/18/2020   UTI (urinary tract infection) 04/23/2020   Patient Active Problem List   Diagnosis Date Noted   Right arm pain 10/22/2021   Prediabetes 10/08/2021   Colon cancer screening 07/05/2021   Adjustment disorder with depressed mood 03/15/2020   Seasonal allergies 02/08/2020   Decreased hearing 12/15/2019   Healthcare maintenance 07/14/2019   CKD (chronic kidney disease) stage 2, GFR 60-89 ml/min 03/21/2012   GERD 09/04/2008   OBESITY, NOS 11/18/2006   HYPERTENSION, BENIGN SYSTEMIC 11/18/2006   Home Medication(s) Prior to Admission medications   Medication Sig Start Date End Date Taking? Authorizing Provider  loperamide (IMODIUM) 2 MG capsule  Take 1 capsule (2 mg total) by mouth 4 (four) times daily as needed for diarrhea or loose stools. 06/18/22  Yes Cristie Hem, MD  ondansetron (ZOFRAN) 4 MG tablet Take 1 tablet (4 mg total) by mouth every 6 (six) hours. 06/18/22  Yes Cristie Hem, MD  amLODipine (NORVASC) 10 MG tablet TAKE 1 TABLET BY MOUTH AT BEDTIME. PLEASE RETURN TO THE CLINIC FOR BP CHECK JAN 2023 01/27/22   Ezequiel Essex, MD  diclofenac Sodium (VOLTAREN) 1 % GEL Apply 2 g topically 4 (four) times daily. To shoulder 10/24/21   Ezequiel Essex, MD  esomeprazole (NEXIUM) 40 MG capsule TAKE 1 CAPSULE BY MOUTH ONCE DAILY AT  12  NOON. 05/26/22   Mansouraty, Telford Nab., MD  fluticasone [facility]) 50 MCG/ACT nasal spray Place 2 sprays into both nostrils daily. 08/21/21   Melynda Ripple, MD  levonorgestrel (MIRENA) 20 MCG/24HR IUD 1 each by Intrauterine route once.    [provider]  meloxicam (MOBIC) 15 MG tablet Take 1 tablet (15 mg total) by mouth daily. For short trial to help with right arm pain. 10/22/21   Ezequiel Essex, MD  spironolactone (ALDACTONE) 25 MG tablet TAKE 2 TABLETS BY MOUTH AT BEDTIME 03/11/22   Sharion Settler, DO  Past Surgical History Past Surgical History:  Procedure Laterality Date   NO PAST SURGERIES     Family History Family History  Problem Relation Age of Onset   Hypertension Mother    Colon cancer Neg Hx    Esophageal cancer Neg Hx    Rectal cancer Neg Hx    Stomach cancer Neg Hx    Inflammatory bowel disease Neg Hx    Liver disease Neg Hx    Pancreatic cancer Neg Hx     Social History Social History   Tobacco Use   Smoking status: Never   Smokeless tobacco: Never  Vaping Use   Vaping Use: Never used  Substance Use Topics   Alcohol use: No   Drug use: No   Allergies Ace inhibitors, Angiotensin receptor blockers, and Hctz  [hydrochlorothiazide]  Review of Systems Review of Systems  All other systems reviewed and are negative.   Physical Exam Vital Signs  I have reviewed the triage vital signs BP (!) 150/94   Pulse 90   Temp 98.2 F (36.8 C) (Oral)   Resp 19   Ht 5\' 2"  (1.575 m)   Wt 79.4 kg   SpO2 96%   BMI 32.02 kg/m  Physical Exam Vitals and nursing note reviewed.  Constitutional:      General: She is not in acute distress.    Appearance: She is well-developed.  HENT:     Head: Normocephalic and atraumatic.     Mouth/Throat:     Mouth: Mucous membranes are moist.  Eyes:     Pupils: Pupils are equal, round, and reactive to light.  Cardiovascular:     Rate and Rhythm: Normal rate and regular rhythm.     Heart sounds: No murmur heard. Pulmonary:     Effort: Pulmonary effort is normal. No respiratory distress.     Breath sounds: Normal breath sounds.  Abdominal:     General: Abdomen is flat.     Palpations: Abdomen is soft.     Tenderness: There is no abdominal tenderness.  Musculoskeletal:        General: No tenderness.     Right lower leg: No edema.     Left lower leg: No edema.  Skin:    General: Skin is warm and dry.  Neurological:     General: No focal deficit present.     Mental Status: She is alert. Mental status is at baseline.  Psychiatric:        Mood and Affect: Mood normal.        Behavior: Behavior normal.     ED Results and Treatments Labs (all labs ordered are listed, but only abnormal results are displayed) Labs Reviewed  BASIC METABOLIC PANEL - Abnormal; Notable for the following components:      Result Value   Sodium 132 (*)    Chloride 97 (*)    Glucose, Bld 122 (*)    All other components within normal limits  URINALYSIS, ROUTINE W REFLEX MICROSCOPIC - Abnormal; Notable for the following components:   Color, Urine COLORLESS (*)    Ketones, ur 15 (*)    All other components within normal limits  CBC  LIPASE, BLOOD  PREGNANCY, URINE  CBG  MONITORING, ED  Radiology No results found.  Pertinent labs & imaging results that were available during my care of the patient were reviewed by me and considered in my medical decision making (see MDM for details).  Medications Ordered in ED Medications  sodium chloride 0.9 % bolus 1,000 mL (1,000 mLs Intravenous New Bag/Given 06/18/22 0915)  ondansetron (ZOFRAN) injection 4 mg (4 mg Intravenous Given 06/18/22 0920)  famotidine (PEPCID) IVPB 20 mg premix (20 mg Intravenous New Bag/Given 06/18/22 0920)  alum & mag hydroxide-simeth (MAALOX/MYLANTA) 200-200-20 MG/5ML suspension 30 mL (30 mLs Oral Given 06/18/22 0921)    And  lidocaine (XYLOCAINE) 2 % viscous mouth solution 15 mL (15 mLs Oral Given 06/18/22 0921)                                                                                                                                     Procedures .1-3 Lead EKG Interpretation  Performed by: Cristie Hem, MD Authorized by: Cristie Hem, MD     Interpretation: normal     ECG rate:  92   ECG rate assessment: normal     Rhythm: sinus rhythm     Ectopy: none     Conduction: normal     (including critical care time)  Medical Decision Making / ED Course   MDM:  46 year old female presenting to the emergency department with generalized weakness.  Patient well-appearing, symptoms most likely related to mild dehydration in the setting of diarrheal illness.  Sodium is slightly low.  Will give IV fluids.  Doubt pancreatitis with normal lipase.  Doubt cholecystitis with no right upper quadrant tenderness.  Doubt ACS with no chest pain, reassuring EKG.  No focal abdominal tenderness to suggest diverticulitis.  Doubt invasive diarrhea without blood in the stool, fevers or chills.  Will reassess, if symptoms improving, likely discharge with Zofran and Imodium    Clinical Course as of 06/18/22 0956  Thu Jun 18, 2022  0940 Ketones, ur(!): 15 [WS]  [phone] Feels better, uranalysis notable for small ketones suggestive of dehydration.  Will prescribe Zofran for nausea and Imodium for her diarrheal symptoms.  Vies close follow-up with primary physician. Will discharge patient to home. All questions answered. Patient comfortable with plan of discharge. Return precautions discussed with patient and specified on the after visit summary.  [WS]    Clinical Course User Index [WS] Truett Mainland, Livingston Diones, MD     Additional history obtained: -Additional history obtained from family -External records from outside source obtained and reviewed including: Chart review including previous notes, labs, imaging, consultation notes   Lab Tests: -I ordered, reviewed, and interpreted labs.   The pertinent results include:   Labs Reviewed  BASIC METABOLIC PANEL - Abnormal; Notable for the following components:      Result Value   Sodium 132 (*)    Chloride 97 (*)    Glucose, Bld 122 (*)    All other  components within normal limits  URINALYSIS, ROUTINE W REFLEX MICROSCOPIC - Abnormal; Notable for the following components:   Color, Urine COLORLESS (*)    Ketones, ur 15 (*)    All other components within normal limits  CBC  LIPASE, BLOOD  PREGNANCY, URINE  CBG MONITORING, ED      EKG   EKG Interpretation  Date/Time:  Thursday June 18 2022 08:09:26 EDT Ventricular Rate:  99 PR Interval:  162 QRS Duration: 86 QT Interval:  358 QTC Calculation: 459 R Axis:   58 Text Interpretation: Normal sinus rhythm Nonspecific T wave abnormality Abnormal ECG When compared with ECG of 13-May-2022 08:39, No significant change since last tracing Confirmed by Garnette Gunner [phone]) on 06/18/2022 9:33:57 AM          Medicines ordered and prescription drug management: Meds ordered this encounter  Medications   sodium chloride 0.9 % bolus 1,000 mL   ondansetron  (ZOFRAN) injection 4 mg   famotidine (PEPCID) IVPB 20 mg premix   AND Linked Order Group    alum & mag hydroxide-simeth (MAALOX/MYLANTA) 200-200-20 MG/5ML suspension 30 mL    lidocaine (XYLOCAINE) 2 % viscous mouth solution 15 mL   ondansetron (ZOFRAN) 4 MG tablet    Sig: Take 1 tablet (4 mg total) by mouth every 6 (six) hours.    Dispense:  12 tablet    Refill:  0   loperamide (IMODIUM) 2 MG capsule    Sig: Take 1 capsule (2 mg total) by mouth 4 (four) times daily as needed for diarrhea or loose stools.    Dispense:  12 capsule    Refill:  0    -I have reviewed the patients home medicines and have made adjustments as needed    Cardiac Monitoring: The patient was maintained on a cardiac monitor.  I personally viewed and interpreted the cardiac monitored which showed an underlying rhythm of: NSR  Social Determinants of Health:  Factors impacting patients care include: obesity   Reevaluation: After the interventions noted above, I reevaluated the patient and found that they have improved  Co morbidities that complicate the patient evaluation  Past Medical History:  Diagnosis Date   Abdominal pain, epigastric 07/27/2020   Anemia    ANEMIA 09/07/2008   Qualifier: Diagnosis of  By: Doreene Nest MD, Kim     Bacterial vaginosis 05/08/2020   Calculus of gallbladder without cholecystitis without obstruction 07/05/2021   Constipation 05/18/2020   Early satiety 05/18/2020   Gastritis and gastroduodenitis 07/27/2020   GERD (gastroesophageal reflux disease)    Hypertension    Non-intractable vomiting 05/18/2020   Palpitations 03/22/2020   Prolonged QT interval 10/04/2020   Screening-pulmonary TB 10/08/2021   Sore throat 09/18/2020   Unintentional weight loss 05/18/2020   UTI (urinary tract infection) 04/23/2020   No prolonged QT interval on ECG today.   Dispostion: Discharge    Final Clinical Impression(s) / ED Diagnoses Final diagnoses:  Nausea vomiting and diarrhea     This chart  was dictated using voice recognition software.  Despite best efforts to proofread,  errors can occur which can change the documentation meaning.    Cristie Hem, MD 06/18/22 [phone]

## 2022-06-18 NOTE — ED Triage Notes (Signed)
Pt arrives to ED with c/o weakness that started today. She reports the weakness is generalized. She does state she has had a "stomach bug" and has had diarrhea for the past four days.

## 2022-06-22 ENCOUNTER — Other Ambulatory Visit: Payer: Self-pay | Admitting: Gastroenterology

## 2022-06-30 ENCOUNTER — Other Ambulatory Visit: Payer: Self-pay

## 2022-06-30 ENCOUNTER — Emergency Department (HOSPITAL_BASED_OUTPATIENT_CLINIC_OR_DEPARTMENT_OTHER)
Admission: EM | Admit: 2022-06-30 | Discharge: 2022-06-30 | Disposition: A | Discharge status: Home / Self Care | Payer: Medicaid Other | Attending: Emergency Medicine | Admitting: Emergency Medicine

## 2022-06-30 ENCOUNTER — Encounter (HOSPITAL_BASED_OUTPATIENT_CLINIC_OR_DEPARTMENT_OTHER): Payer: Self-pay

## 2022-06-30 DIAGNOSIS — K929 Disease of digestive system, unspecified: Secondary | ICD-10-CM | POA: Diagnosis not present

## 2022-06-30 DIAGNOSIS — E860 Dehydration: Secondary | ICD-10-CM | POA: Insufficient documentation

## 2022-06-30 DIAGNOSIS — R197 Diarrhea, unspecified: Secondary | ICD-10-CM | POA: Diagnosis not present

## 2022-06-30 DIAGNOSIS — R000 Tachycardia, unspecified: Secondary | ICD-10-CM | POA: Diagnosis not present

## 2022-06-30 DIAGNOSIS — R198 Other specified symptoms and signs involving the digestive system and abdomen: Secondary | ICD-10-CM

## 2022-06-30 LAB — COMPREHENSIVE METABOLIC PANEL
ALT: 21 U/L (ref 0–44)
AST: 23 U/L (ref 15–41)
Albumin: 4.3 g/dL (ref 3.5–5.0)
Alkaline Phosphatase: 45 U/L (ref 38–126)
Anion gap: 12 (ref 5–15)
BUN: 8 mg/dL (ref 6–20)
CO2: 22 mmol/L (ref 22–32)
Calcium: 9.3 mg/dL (ref 8.9–10.3)
Chloride: 101 mmol/L (ref 98–111)
Creatinine, Ser: 0.92 mg/dL (ref 0.44–1.00)
GFR, Estimated: >60 mL/min (ref >60)
Glucose, Bld: 109 mg/dL — ABNORMAL HIGH (ref 70–99)
Potassium: 3.7 mmol/L (ref 3.5–5.1)
Sodium: 135 mmol/L (ref 135–145)
Total Bilirubin: 0.4 mg/dL (ref 0.3–1.2)
Total Protein: 7.9 g/dL (ref 6.5–8.1)

## 2022-06-30 LAB — URINALYSIS, ROUTINE W REFLEX MICROSCOPIC
Bilirubin Urine: NEGATIVE
Glucose, UA: NEGATIVE mg/dL
Hgb urine dipstick: NEGATIVE
Ketones, ur: 15 mg/dL — AB
Leukocytes,Ua: NEGATIVE
Nitrite: NEGATIVE
Protein, ur: NEGATIVE mg/dL
Specific Gravity, Urine: <1.005 — ABNORMAL LOW (ref 1.005–1.030)
pH: 6 (ref 5.0–8.0)

## 2022-06-30 LAB — CBC WITH DIFFERENTIAL/PLATELET
Abs Immature Granulocytes: 0.02 10*3/uL (ref 0.00–0.07)
Basophils Absolute: 0 10*3/uL (ref 0.0–0.1)
Basophils Relative: 0 %
Eosinophils Absolute: 0 10*3/uL (ref 0.0–0.5)
Eosinophils Relative: 0 %
HCT: 37.2 % (ref 36.0–46.0)
Hemoglobin: 12.3 g/dL (ref 12.0–15.0)
Immature Granulocytes: 0 %
Lymphocytes Relative: 13 %
Lymphs Abs: 0.9 10*3/uL (ref 0.7–4.0)
MCH: 28.9 pg (ref 26.0–34.0)
MCHC: 33.1 g/dL (ref 30.0–36.0)
MCV: 87.5 fL (ref 80.0–100.0)
Monocytes Absolute: 0.7 10*3/uL (ref 0.1–1.0)
Monocytes Relative: 9 %
Neutro Abs: 5.6 10*3/uL (ref 1.7–7.7)
Neutrophils Relative %: 78 %
Platelets: 262 10*3/uL (ref 150–400)
RBC: 4.25 MIL/uL (ref 3.87–5.11)
RDW: 13.6 % (ref 11.5–15.5)
WBC: 7.3 10*3/uL (ref 4.0–10.5)
nRBC: 0 % (ref 0.0–0.2)

## 2022-06-30 LAB — MAGNESIUM: Magnesium: 2.3 mg/dL (ref 1.7–2.4)

## 2022-06-30 LAB — LIPASE, BLOOD: Lipase: 11 U/L (ref 11–51)

## 2022-06-30 MED ORDER — ONDANSETRON HCL 4 MG PO TABS: 4.0000 mg | ORAL_TABLET | Freq: Four times a day (QID) | ORAL | 0 refills | Status: DC

## 2022-06-30 MED ORDER — DIPHENOXYLATE-ATROPINE 2.5-0.025 MG PO TABS
2.0000 | ORAL_TABLET | Freq: Once | ORAL | Status: AC
  Administered 2022-06-30: 2 via ORAL
  Filled 2022-06-30: qty 2

## 2022-06-30 MED ORDER — LACTATED RINGERS IV BOLUS
1000.0000 mL | Freq: Once | INTRAVENOUS | Status: AC
  Administered 2022-06-30: 1000 mL via INTRAVENOUS

## 2022-06-30 MED ORDER — ONDANSETRON HCL 4 MG/2ML IJ SOLN
4.0000 mg | Freq: Once | INTRAMUSCULAR | Status: AC
  Administered 2022-06-30: 4 mg via INTRAVENOUS
  Filled 2022-06-30: qty 2

## 2022-06-30 MED ORDER — PANTOPRAZOLE SODIUM 40 MG IV SOLR
40.0000 mg | Freq: Once | INTRAVENOUS | Status: AC
  Administered 2022-06-30: 40 mg via INTRAVENOUS
  Filled 2022-06-30: qty 10

## 2022-06-30 NOTE — ED Triage Notes (Signed)
POV, pt sts diarrhea x 2 days, sts decreased appetite, vomiting yesterday but none today. Pt sts she had this last week and got better, then started feeling bad again. A&O x4, amb to room.

## 2022-06-30 NOTE — ED Provider Notes (Signed)
[facility] EMERGENCY DEPT Provider Note   CSN: [id number] Arrival date & time: 06/30/22  0059     History {Add pertinent medical, surgical, social history, OB history to HPI:1} Chief Complaint  Patient presents with   Diarrhea    Tamara Church is a 46 y.o. female.  Had some type of GI illness last week and was treated in the ER for the same.  Got better but then a family member brought home some other illness from school/daycare.  The last few days she has had multiple episodes of nonbloody nonbilious emesis along with nonbloody diarrhea.  No fevers.  No point tenderness just abdominal cramping.  States she also has gastritis and this often gets worse with these type of illnesses.  Tried Imodium at home which did not seem to help.   Diarrhea      Home Medications Prior to Admission medications   Medication Sig Start Date End Date Taking? Authorizing Provider  amLODipine (NORVASC) 10 MG tablet TAKE 1 TABLET BY MOUTH AT BEDTIME. PLEASE RETURN TO THE CLINIC FOR BP CHECK JAN 2023 01/27/22   Ezequiel Essex, MD  diclofenac Sodium (VOLTAREN) 1 % GEL Apply 2 g topically 4 (four) times daily. To shoulder 10/24/21   Ezequiel Essex, MD  esomeprazole ([place]) 40 MG capsule TAKE 1 CAPSULE BY MOUTH ONCE DAILY AT  NOON. 06/22/22   Mansouraty, Telford Nab., MD  fluticasone (FLONASE) 50 MCG/ACT nasal spray Place 2 sprays into both nostrils daily. 08/21/21   Melynda Ripple, MD  levonorgestrel (MIRENA) 20 MCG/24HR IUD 1 each by Intrauterine route once.    [provider]  loperamide (IMODIUM) 2 MG capsule Take 1 capsule (2 mg total) by mouth 4 (four) times daily as needed for diarrhea or loose stools. 06/18/22   Cristie Hem, MD  meloxicam (MOBIC) 15 MG tablet Take 1 tablet (15 mg total) by mouth daily. For short trial to help with right arm pain. 10/22/21   Ezequiel Essex, MD  ondansetron (ZOFRAN) 4 MG tablet Take 1 tablet (4 mg total) by mouth every 6 (six) hours.  06/18/22   Cristie Hem, MD  spironolactone (ALDACTONE) 25 MG tablet TAKE 2 TABLETS BY MOUTH AT BEDTIME 03/11/22   Sharion Settler, DO      Allergies    Ace inhibitors, Angiotensin receptor blockers, and Hctz [hydrochlorothiazide]    Review of Systems   Review of Systems  Gastrointestinal:  Positive for diarrhea.    Physical Exam Updated Vital Signs BP (!) 145/103   Pulse (!) 109   Temp 99.4 F (37.4 C) (Oral)   Resp 18   Ht 5\' 2"  (1.575 m)   Wt 79.4 kg   SpO2 98%   BMI 32.02 kg/m  Physical Exam Vitals and nursing note reviewed.  Constitutional:      Appearance: She is well-developed.  HENT:     Head: Normocephalic and atraumatic.     Mouth/Throat:     Mouth: Mucous membranes are dry.  Eyes:     Pupils: Pupils are equal, round, and reactive to light.  Cardiovascular:     Rate and Rhythm: Regular rhythm. Tachycardia present.  Pulmonary:     Effort: No respiratory distress.     Breath sounds: No stridor.  Abdominal:     General: There is no distension.  Musculoskeletal:     Cervical back: Normal range of motion.  Skin:    General: Skin is warm and dry.  Neurological:     General: No focal  deficit present.     Mental Status: She is alert and oriented to person, place, and time.     ED Results / Procedures / Treatments   Labs (all labs ordered are listed, but only abnormal results are displayed) Labs Reviewed  CBC WITH DIFFERENTIAL/PLATELET  COMPREHENSIVE METABOLIC PANEL  LIPASE, BLOOD  URINALYSIS, ROUTINE W REFLEX MICROSCOPIC  MAGNESIUM    EKG None  Radiology No results found.  Procedures Procedures  {Document cardiac monitor, telemetry assessment procedure when appropriate:1}  Medications Ordered in ED Medications  ondansetron (ZOFRAN) injection 4 mg (has no administration in time range)  diphenoxylate-atropine (LOMOTIL) 2.5-0.025 MG per tablet 2 tablet (has no administration in time range)  lactated ringers bolus 1,000 mL (1,000 mLs  Intravenous New Bag/Given 06/30/22 0201)  pantoprazole (PROTONIX) injection 40 mg (40 mg Intravenous Given 06/30/22 0201)    ED Course/ Medical Decision Making/ A&P                           Medical Decision Making Amount and/or Complexity of Data Reviewed Labs: ordered.  Risk Prescription drug management.  Mild dehydration from GI illness, likely viral gastroenteritis, no focal ttp to suggest significant abnormality indicating imaging. Will check labs, give fluids and other symptomatic treatments. Reeval for disposition.  ***  {Document critical care time when appropriate:1} {Document review of labs and clinical decision tools ie heart score, Chads2Vasc2 etc:1}  {Document your independent review of radiology images, and any outside records:1} {Document your discussion with family members, caretakers, and with consultants:1} {Document social determinants of health affecting pt's care:1} {Document your decision making why or why not admission, treatments were needed:1} Final Clinical Impression(s) / ED Diagnoses Final diagnoses:  None    Rx / DC Orders ED Discharge Orders     None

## 2022-07-01 ENCOUNTER — Other Ambulatory Visit: Payer: Self-pay

## 2022-07-01 ENCOUNTER — Encounter (HOSPITAL_BASED_OUTPATIENT_CLINIC_OR_DEPARTMENT_OTHER): Payer: Self-pay

## 2022-07-01 ENCOUNTER — Emergency Department (HOSPITAL_BASED_OUTPATIENT_CLINIC_OR_DEPARTMENT_OTHER)
Admission: EM | Admit: 2022-07-01 | Discharge: 2022-07-01 | Disposition: A | Discharge status: Home / Self Care | Payer: Medicaid Other | Attending: Emergency Medicine | Admitting: Emergency Medicine

## 2022-07-01 ENCOUNTER — Telehealth: Payer: Self-pay | Admitting: *Deleted

## 2022-07-01 DIAGNOSIS — R1013 Epigastric pain: Secondary | ICD-10-CM | POA: Diagnosis not present

## 2022-07-01 DIAGNOSIS — K30 Functional dyspepsia: Secondary | ICD-10-CM | POA: Diagnosis not present

## 2022-07-01 DIAGNOSIS — R110 Nausea: Secondary | ICD-10-CM | POA: Diagnosis not present

## 2022-07-01 DIAGNOSIS — R197 Diarrhea, unspecified: Secondary | ICD-10-CM | POA: Diagnosis not present

## 2022-07-01 LAB — COMPREHENSIVE METABOLIC PANEL
ALT: 19 U/L (ref 0–44)
AST: 16 U/L (ref 15–41)
Albumin: 4.6 g/dL (ref 3.5–5.0)
Alkaline Phosphatase: 49 U/L (ref 38–126)
Anion gap: 10 (ref 5–15)
BUN: 6 mg/dL (ref 6–20)
CO2: 27 mmol/L (ref 22–32)
Calcium: 10.2 mg/dL (ref 8.9–10.3)
Chloride: 99 mmol/L (ref 98–111)
Creatinine, Ser: 0.89 mg/dL (ref 0.44–1.00)
GFR, Estimated: >60 mL/min (ref >60)
Glucose, Bld: 117 mg/dL — ABNORMAL HIGH (ref 70–99)
Potassium: 3.4 mmol/L — ABNORMAL LOW (ref 3.5–5.1)
Sodium: 136 mmol/L (ref 135–145)
Total Bilirubin: 0.3 mg/dL (ref 0.3–1.2)
Total Protein: 9.3 g/dL — ABNORMAL HIGH (ref 6.5–8.1)

## 2022-07-01 LAB — URINALYSIS, ROUTINE W REFLEX MICROSCOPIC
Bilirubin Urine: NEGATIVE
Glucose, UA: NEGATIVE mg/dL
Hgb urine dipstick: NEGATIVE
Ketones, ur: NEGATIVE mg/dL
Leukocytes,Ua: NEGATIVE
Nitrite: NEGATIVE
Protein, ur: NEGATIVE mg/dL
Specific Gravity, Urine: <1.005 — ABNORMAL LOW (ref 1.005–1.030)
pH: 6.5 (ref 5.0–8.0)

## 2022-07-01 LAB — CBC
HCT: 39.8 % (ref 36.0–46.0)
Hemoglobin: 13 g/dL (ref 12.0–15.0)
MCH: 29 pg (ref 26.0–34.0)
MCHC: 32.7 g/dL (ref 30.0–36.0)
MCV: 88.6 fL (ref 80.0–100.0)
Platelets: 271 10*3/uL (ref 150–400)
RBC: 4.49 MIL/uL (ref 3.87–5.11)
RDW: 13.8 % (ref 11.5–15.5)
WBC: 6.2 10*3/uL (ref 4.0–10.5)
nRBC: 0 % (ref 0.0–0.2)

## 2022-07-01 LAB — LIPASE, BLOOD: Lipase: 14 U/L (ref 11–51)

## 2022-07-01 LAB — PREGNANCY, URINE: Preg Test, Ur: NEGATIVE

## 2022-07-01 MED ORDER — ALUM & MAG HYDROXIDE-SIMETH 200-200-20 MG/5ML PO SUSP
30.0000 mL | Freq: Once | ORAL | Status: AC
  Administered 2022-07-01: 30 mL via ORAL
  Filled 2022-07-01: qty 30

## 2022-07-01 NOTE — Patient Outreach (Signed)
Care Coordination  07/01/2022  Tamara Church 1976/07/06 [id number]   Transition Care Management Follow-up Telephone Call Date of discharge and from where: 06/30/22 from [facility] ED How have you been since you were released from the hospital? Patient reports not feeling well, feeling dehydrated. Any questions or concerns? Yes Patient continues to feel dehydrated. She reports 3 episodes of diarrhea this morning  Items Reviewed: Did the pt receive and understand the discharge instructions provided? Yes  Medications obtained and verified? Yes  Other? Yes RNCM discussed the importance of hydration, provided examples such as, jello, broth, gatorade Any new allergies since your discharge? No  Dietary orders reviewed? Yes Do you have support at home? Yes   Home Care and Equipment/Supplies: Were home health services ordered? no If so, what is the name of the agency? N/A  Has the agency set up a time to come to the patient's home? not applicable Were any new equipment or medical supplies ordered?  No What is the name of the medical supply agency? N/A Were you able to get the supplies/equipment? not applicable Do you have any questions related to the use of the equipment or supplies? No  Functional Questionnaire: (I = Independent and D = Dependent) ADLs: I  Bathing/Dressing- I  Meal Prep- I  Eating- I  Maintaining continence- I  Transferring/Ambulation- I  Managing Meds- I  Follow up appointments reviewed:  PCP Hospital f/u appt confirmed?  N/A, ED visit  RNCM advised patient to follow up with PCP if symptoms continue [facility] f/u appt confirmed?  ED visit  RNCM advised patient to follow up with GI provider for recommended yearly visit 06/2022. Are transportation arrangements needed? No  If their condition worsens, is the pt aware to call PCP or go to the Emergency Dept.? Yes Was the patient provided with contact information for the PCP's office or ED? No Was  to pt encouraged to call back with questions or concerns? Yes  RNCM offered patient CM services, she declines at this time.  Tamara Joiner RN, BSN Tamara Church  [employer] manager

## 2022-07-01 NOTE — Discharge Instructions (Addendum)
You were seen in the ER for diarrhea and dyspepsia. It is reassuring that your diarrhea has stopped this morning.  I suspect the cause of your illness is viral in nature and takes time to improve.  Your work-up does not show any evidence of dehydration at this time.  It is important for you to continue oral rehydration at home.  Oral rehydration with electrolyte drinks such as Gatorade, Powerade, Electrolit, Pedialyte is recommended.  I recommend following a bland diet for the next few days as well.  Return to ED if you develop new or worsening symptoms.  Follow-up with your PCP if symptoms do not improve.

## 2022-07-01 NOTE — ED Provider Notes (Signed)
[facility] EMERGENCY DEPT Provider Note   CSN: [id number] Arrival date & time: 07/01/22  1654     History  Chief Complaint  Patient presents with   Diarrhea    Tamara Church is a 46 y.o. female who presents to the ED for symptoms of diarrhea and nausea.  She has same symptoms as yesterday when she was evaluated in ED.  She states she continued to have diarrhea through the night but it stopped sometime this morning.  She has not had any more episodes of diarrhea today.  Also reports generalized abdominal cramping and tenderness.  Denies fever, chills, vomiting, abnormal color of stool, urinary symptoms, hematemesis, hematochezia.       Home Medications Prior to Admission medications   Medication Sig Start Date End Date Taking? Authorizing Provider  amLODipine (NORVASC) 10 MG tablet TAKE 1 TABLET BY MOUTH AT BEDTIME. PLEASE RETURN TO THE CLINIC FOR BP CHECK JAN 2023 01/27/22   Fayette Pho, MD  diclofenac Sodium (VOLTAREN) 1 % GEL Apply 2 g topically 4 (four) times daily. To shoulder 10/24/21   Fayette Pho, MD  esomeprazole (NEXIUM) 40 MG capsule TAKE 1 CAPSULE BY MOUTH ONCE DAILY AT  NOON. 06/22/22   Mansouraty, Netty Starring., MD  fluticasone (FLONASE) 50 MCG/ACT nasal spray Place 2 sprays into both nostrils daily. 08/21/21   Domenick Gong, MD  levonorgestrel (MIRENA) 20 MCG/24HR IUD 1 each by Intrauterine route once.    [provider]  loperamide (IMODIUM) 2 MG capsule Take 1 capsule (2 mg total) by mouth 4 (four) times daily as needed for diarrhea or loose stools. 06/18/22   Lonell Grandchild, MD  meloxicam (MOBIC) 15 MG tablet Take 1 tablet (15 mg total) by mouth daily. For short trial to help with right arm pain. 10/22/21   Fayette Pho, MD  ondansetron (ZOFRAN) 4 MG tablet Take 1 tablet (4 mg total) by mouth every 6 (six) hours. 06/30/22   Mesner, Barbara Cower, MD  spironolactone (ALDACTONE) 25 MG tablet TAKE 2 TABLETS BY MOUTH AT BEDTIME 03/11/22    Sabino Dick, DO      Allergies    Ace inhibitors, Angiotensin receptor blockers, and Hctz [hydrochlorothiazide]    Review of Systems   Review of Systems  Constitutional:  Negative for chills and fever.  Gastrointestinal:  Positive for abdominal pain, diarrhea and nausea. Negative for blood in stool and vomiting.  Genitourinary:  Negative for difficulty urinating, dysuria, flank pain and frequency.  Skin:  Negative for color change.    Physical Exam Updated Vital Signs BP (!) 151/120   Pulse 78   Temp 98.6 F (37 C) (Oral)   Resp 18   Ht 5\' 2"  (1.575 m)   Wt 79.4 kg   SpO2 100%   BMI 32.02 kg/m  Physical Exam Vitals and nursing note reviewed.  Constitutional:      General: She is not in acute distress.    Appearance: Normal appearance. She is not ill-appearing or diaphoretic.  HENT:     Mouth/Throat:     Mouth: Mucous membranes are moist.     Pharynx: Oropharynx is clear.  Cardiovascular:     Rate and Rhythm: Normal rate and regular rhythm.     Heart sounds: Normal heart sounds.  Pulmonary:     Effort: Pulmonary effort is normal.     Breath sounds: Normal breath sounds and air entry.  Abdominal:     General: Bowel sounds are normal. There is no distension.  Palpations: Abdomen is soft.     Tenderness: There is generalized abdominal tenderness. There is no guarding.  Skin:    General: Skin is warm and dry.  Neurological:     Mental Status: She is alert. Mental status is at baseline.  Psychiatric:        Mood and Affect: Mood normal.        Behavior: Behavior normal.     ED Results / Procedures / Treatments   Labs (all labs ordered are listed, but only abnormal results are displayed) Labs Reviewed  COMPREHENSIVE METABOLIC PANEL - Abnormal; Notable for the following components:      Result Value   Potassium 3.4 (*)    Glucose, Bld 117 (*)    Total Protein 9.3 (*)    All other components within normal limits  URINALYSIS, ROUTINE W REFLEX  MICROSCOPIC - Abnormal; Notable for the following components:   Color, Urine COLORLESS (*)    Specific Gravity, Urine <1.005 (*)    All other components within normal limits  LIPASE, BLOOD  CBC  PREGNANCY, URINE    EKG None  Radiology No results found.  Procedures Procedures    Medications Ordered in ED Medications  alum & mag hydroxide-simeth (MAALOX/MYLANTA) 200-200-20 MG/5ML suspension 30 mL (30 mLs Oral Given 07/01/22 2242)    ED Course/ Medical Decision Making/ A&P                           Medical Decision Making Amount and/or Complexity of Data Reviewed Labs: ordered.   She returns to ER with continued complaints of nausea and diarrhea.  Patient was seen yesterday in ER for same.  Expresses concerns of dehydration.  She has had no difficulty urinating.  She reports her diarrhea stopped this morning and has had no further episodes today.  She also complains of worsened indigestion.  He has not used any over-the-counter medications.  Differential diagnoses viral gastritis, gastroenteritis, dyspepsia.  Based on patient's symptoms I am suspicious for viral etiology as patient now seems to be improving.  Labs were repeated today and showed no significant changes from yesterday.  UA suggestive of dehydration given colorless urine with low specific gravity.  CBC shows no leukocytosis.  Physical exam only significant for generalized abdominal tenderness, abdomen is soft.  Given exam and work-up, I have no suspicion of dehydration and need for fluid resuscitation.  Patient does have complaint of dyspepsia which I will treat with oral Maalox.  Plan to discharge patient home and stressed importance of oral rehydration.  Return precautions given.  Discussed plan with patient who agreed with discharge.        Final Clinical Impression(s) / ED Diagnoses Final diagnoses:  Diarrhea, unspecified type  Dyspepsia    Rx / DC Orders ED Discharge Orders     None          Pat Kocher, [place] 07/01/22 2311    Varney Biles, MD 07/09/22 1556

## 2022-07-01 NOTE — ED Triage Notes (Signed)
Patient here POV from Home.  Endorses Diarrhea for approximately 3 Days. Seen twice for Same Symptoms in the ED in the past two days but states at Home Symptoms are recurrent.   Some Nausea. No Emesis. No Fevers.   NAD noted during Triage. A&Ox4. GCS 15. Ambulatory.

## 2022-07-02 ENCOUNTER — Telehealth: Payer: Self-pay | Admitting: Gastroenterology

## 2022-07-02 NOTE — Telephone Encounter (Signed)
I spoke with the pt who states she has been to the ED on several occasions for diarrhea and nausea.  She states that the diarrhea has started to improve.  She has an appt for 11/14 with Tye Savoy NP but would like to be seen sooner.  I did check for sooner appts and nothing available.  She will keep the appt as planned and call PCP for possible sooner appt.  The pt has been advised of the information and verbalized understanding.

## 2022-07-02 NOTE — Telephone Encounter (Signed)
Inbound call from patient requesting to speak with a nurse regarding her last visit to ED,states she is feeling really bad , weak and need an advise .She is already  schedule 08/04/2022

## 2022-07-02 NOTE — Progress Notes (Signed)
  SUBJECTIVE:   CHIEF COMPLAINT / HPI:   Nausea/diarrhea: Has been seen for this twice in the ED in the last few days, most recently on 07/01/2022.  ED provider noted that diarrhea was improving, UA suggestive of dehydration but CBC without signs of leukocytosis. She follows with [facility] for GERD with microscopic esophagitis. States her son came home with a stomach bug a few weeks ago. Then she was exposed to her grandkids who also had stomach bugs. She was seen in the ED for dehydration. She tried some soup yesterday but her stomach told her to stop. Zofran has helped and then she was able to eat. She is able to drink but not so much eating. Feels some gas in her chest and complains of stomach knots as well. Denies fever. Not had diarrhea since Monday, no further vomiting since Tuesday. Trying to get worked in with GI in November.   PERTINENT  PMH / PSH: Obesity, GERD, HTN  OBJECTIVE:  BP 126/84   Pulse 83   Ht 5\' 2"  (1.575 m)   Wt 219 lb 6.4 oz (99.5 kg)   SpO2 100%   BMI 40.13 kg/m  Physical Exam Constitutional:      Appearance: Normal appearance.  Cardiovascular:     Rate and Rhythm: Normal rate and regular rhythm.     Heart sounds: Normal heart sounds.  Pulmonary:     Effort: Pulmonary effort is normal.  Abdominal:     General: Bowel sounds are normal.     Palpations: Abdomen is soft.     Tenderness: There is no abdominal tenderness.     Comments: Obese abdomen  Neurological:     Mental Status: She is alert.  Psychiatric:        Mood and Affect: Mood normal.        Behavior: Behavior normal.    ASSESSMENT/PLAN:  Viral gastroenteritis Assessment & Plan: Improving, but still experiencing some difficulty with eating due to belching.  Reviewed ED course and lab work. No concern for dehydration and physical exam is reassuring.  Conservative management, Gas-X for assistance with belching.  Follow-up with GI for microscopic esophagitis.   Return if symptoms worsen or fail  to improve. Wells Guiles, DO 07/03/2022, 12:48 PM PGY-2, [place]

## 2022-07-03 ENCOUNTER — Telehealth: Payer: Self-pay | Admitting: Gastroenterology

## 2022-07-03 ENCOUNTER — Ambulatory Visit (INDEPENDENT_AMBULATORY_CARE_PROVIDER_SITE_OTHER): Payer: Medicaid Other | Admitting: Student

## 2022-07-03 VITALS — BP 126/84 | HR 83 | Ht 62.0 in | Wt 219.4 lb

## 2022-07-03 DIAGNOSIS — A084 Viral intestinal infection, unspecified: Secondary | ICD-10-CM | POA: Diagnosis not present

## 2022-07-03 MED ORDER — ESOMEPRAZOLE MAGNESIUM 40 MG PO CPDR: 40.0000 mg | DELAYED_RELEASE_CAPSULE | Freq: Every day | ORAL | 0 refills | Status: DC

## 2022-07-03 NOTE — Assessment & Plan Note (Addendum)
Improving, but still experiencing some difficulty with eating due to belching.  Reviewed ED course and lab work. No concern for dehydration and physical exam is reassuring.  Conservative management, Gas-X for assistance with belching.  Follow-up with GI for microscopic esophagitis.

## 2022-07-03 NOTE — Telephone Encounter (Signed)
Returned call to patient. Advised that refill has been sent to pharmacy. Pt needs to keep ROV with app in Nov. Pt voiced understanding.

## 2022-07-03 NOTE — Patient Instructions (Signed)
It was great to see you today! Thank you for choosing [facility] for your primary care. Tamara Church was seen for viral gastroenteritis.  Today we addressed: It is reassuring that you are able to drink and you are not having any further vomiting or diarrhea.  The nausea may persist for a little while but you may use the Zofran as needed.  Given you are experiencing burping, I would recommend over-the-counter Gas-X (simethicone) which will help to break up the gas that is causing your burping.  Go slow with introducing new foods and stick with foods that are softer on the stomach AKA not fatty/fried foods.  I recommend getting set up with GI for further management of your reflux.  If you haven't already, sign up for My Chart to have easy access to your labs results, and communication with your primary care physician.  Call the clinic at [phone] if your symptoms worsen or you have any concerns.  You should return to our clinic Return if symptoms worsen or fail to improve. Please arrive 15 minutes before your appointment to ensure smooth check in process.  We appreciate your efforts in making this happen.  Thank you for allowing me to participate in your care, Wells Guiles, DO 07/03/2022, 11:39 AM PGY-2, [place]

## 2022-07-03 NOTE — Telephone Encounter (Signed)
Patient requesting refill on nexium, please advise. Has APP appt on 11/14

## 2022-07-17 ENCOUNTER — Other Ambulatory Visit: Payer: Self-pay

## 2022-07-17 ENCOUNTER — Emergency Department (HOSPITAL_BASED_OUTPATIENT_CLINIC_OR_DEPARTMENT_OTHER)
Admission: EM | Admit: 2022-07-17 | Discharge: 2022-07-17 | Disposition: A | Discharge status: Home / Self Care | Payer: Medicaid Other | Attending: Emergency Medicine | Admitting: Emergency Medicine

## 2022-07-17 DIAGNOSIS — R197 Diarrhea, unspecified: Secondary | ICD-10-CM | POA: Diagnosis not present

## 2022-07-17 DIAGNOSIS — R1013 Epigastric pain: Secondary | ICD-10-CM | POA: Diagnosis not present

## 2022-07-17 DIAGNOSIS — I10 Essential (primary) hypertension: Secondary | ICD-10-CM | POA: Diagnosis not present

## 2022-07-17 DIAGNOSIS — R109 Unspecified abdominal pain: Secondary | ICD-10-CM | POA: Diagnosis present

## 2022-07-17 DIAGNOSIS — N390 Urinary tract infection, site not specified: Secondary | ICD-10-CM | POA: Diagnosis not present

## 2022-07-17 LAB — COMPREHENSIVE METABOLIC PANEL
ALT: 15 U/L (ref 0–44)
AST: 14 U/L — ABNORMAL LOW (ref 15–41)
Albumin: 4.5 g/dL (ref 3.5–5.0)
Alkaline Phosphatase: 48 U/L (ref 38–126)
Anion gap: 8 (ref 5–15)
BUN: 10 mg/dL (ref 6–20)
CO2: 26 mmol/L (ref 22–32)
Calcium: 10 mg/dL (ref 8.9–10.3)
Chloride: 104 mmol/L (ref 98–111)
Creatinine, Ser: 0.94 mg/dL (ref 0.44–1.00)
GFR, Estimated: >60 mL/min (ref >60)
Glucose, Bld: 118 mg/dL — ABNORMAL HIGH (ref 70–99)
Potassium: 4 mmol/L (ref 3.5–5.1)
Sodium: 138 mmol/L (ref 135–145)
Total Bilirubin: 0.4 mg/dL (ref 0.3–1.2)
Total Protein: 8.5 g/dL — ABNORMAL HIGH (ref 6.5–8.1)

## 2022-07-17 LAB — URINALYSIS, ROUTINE W REFLEX MICROSCOPIC
Bilirubin Urine: NEGATIVE
Glucose, UA: NEGATIVE mg/dL
Hgb urine dipstick: NEGATIVE
Ketones, ur: NEGATIVE mg/dL
Leukocytes,Ua: NEGATIVE
Nitrite: NEGATIVE
Protein, ur: NEGATIVE mg/dL
Specific Gravity, Urine: 1.011 (ref 1.005–1.030)
pH: 7 (ref 5.0–8.0)

## 2022-07-17 LAB — CBC
HCT: 38.4 % (ref 36.0–46.0)
Hemoglobin: 12.3 g/dL (ref 12.0–15.0)
MCH: 28.7 pg (ref 26.0–34.0)
MCHC: 32 g/dL (ref 30.0–36.0)
MCV: 89.7 fL (ref 80.0–100.0)
Platelets: 302 10*3/uL (ref 150–400)
RBC: 4.28 MIL/uL (ref 3.87–5.11)
RDW: 13.8 % (ref 11.5–15.5)
WBC: 7.3 10*3/uL (ref 4.0–10.5)
nRBC: 0 % (ref 0.0–0.2)

## 2022-07-17 LAB — LIPASE, BLOOD: Lipase: 17 U/L (ref 11–51)

## 2022-07-17 LAB — PREGNANCY, URINE: Preg Test, Ur: NEGATIVE

## 2022-07-17 MED ORDER — ONDANSETRON 4 MG PO TBDP: 4.0000 mg | ORAL_TABLET | Freq: Three times a day (TID) | ORAL | 0 refills | Status: DC | PRN

## 2022-07-17 MED ORDER — ONDANSETRON 4 MG PO TBDP
4.0000 mg | ORAL_TABLET | Freq: Once | ORAL | Status: AC | PRN
  Administered 2022-07-17: 4 mg via ORAL
  Filled 2022-07-17: qty 1

## 2022-07-17 MED ORDER — PROCHLORPERAZINE MALEATE 10 MG PO TABS
10.0000 mg | ORAL_TABLET | Freq: Once | ORAL | Status: AC
  Administered 2022-07-17: 10 mg via ORAL
  Filled 2022-07-17: qty 1

## 2022-07-17 MED ORDER — LOPERAMIDE HCL 2 MG PO CAPS: 2.0000 mg | ORAL_CAPSULE | Freq: Four times a day (QID) | ORAL | 0 refills | Status: DC | PRN

## 2022-07-17 MED ORDER — DICYCLOMINE HCL 20 MG PO TABS: 20.0000 mg | ORAL_TABLET | Freq: Two times a day (BID) | ORAL | 0 refills | Status: DC

## 2022-07-17 NOTE — ED Provider Notes (Signed)
[facility] EMERGENCY [facility] Emergency Department Provider Note MRN:  [id number]  Arrival date & time: 07/17/22     Chief Complaint   Abdominal Pain   History of Present Illness   Tamara Church is a 46 y.o. year-old female with a history of hypertension presenting to the ED with chief complaint of abdominal pain.  Diffuse abdominal crampy abdominal pain associated with diarrhea over the past several days.  No fever, no nausea vomiting.  No chest pain or shortness of breath.  No recent travel, no recent antibiotics, no blood in the stool.  Review of Systems  A thorough review of systems was obtained and all systems are negative except as noted in the HPI and PMH.   Patient's Health History    Past Medical History:  Diagnosis Date   Abdominal pain, epigastric 07/27/2020   Anemia    ANEMIA 09/07/2008   Qualifier: Diagnosis of  By: Earnest Bailey MD, Kim     Bacterial vaginosis 05/08/2020   Calculus of gallbladder without cholecystitis without obstruction 07/05/2021   Constipation 05/18/2020   Early satiety 05/18/2020   Gastritis and gastroduodenitis 07/27/2020   GERD (gastroesophageal reflux disease)    Hypertension    Non-intractable vomiting 05/18/2020   Palpitations 03/22/2020   Prolonged QT interval 10/04/2020   Screening-pulmonary TB 10/08/2021   Sore throat 09/18/2020   Unintentional weight loss 05/18/2020   UTI (urinary tract infection) 04/23/2020    Past Surgical History:  Procedure Laterality Date   NO PAST SURGERIES      Family History  Problem Relation Age of Onset   Hypertension Mother    Colon cancer Neg Hx    Esophageal cancer Neg Hx    Rectal cancer Neg Hx    Stomach cancer Neg Hx    Inflammatory bowel disease Neg Hx    Liver disease Neg Hx    Pancreatic cancer Neg Hx     Social History   Socioeconomic History   Marital status: Single    Spouse name: Not on file   Number of children: Not on file   Years of education: Not on file   Highest education  level: Not on file  Occupational History   Not on file  Tobacco Use   Smoking status: Never   Smokeless tobacco: Never  Vaping Use   Vaping Use: Never used  Substance and Sexual Activity   Alcohol use: No   Drug use: No   Sexual activity: Yes    Birth control/protection: I.U.D.    Comment: pt would like to start depo provera today  Other Topics Concern   Not on file  Social History Narrative   Not on file   Social Determinants of Health   Financial Resource Strain: Not on file  Food Insecurity: Not on file  Transportation Needs: Not on file  Physical Activity: Not on file  Stress: Not on file  Social Connections: Not on file  Intimate Partner Violence: Not on file     Physical Exam   Vitals:   07/17/22 0113 07/17/22 0333  BP: (!) 160/98 129/79  Pulse: 90 90  Resp: 15 16  Temp: 98.3 F (36.8 C)   SpO2: 100% 100%    CONSTITUTIONAL: Well-appearing, NAD NEURO/PSYCH:  Alert and oriented x 3, no focal deficits EYES:  eyes equal and reactive ENT/NECK:  no LAD, no JVD CARDIO: Regular rate, well-perfused, normal S1 and S2 PULM:  CTAB no wheezing or rhonchi GI/GU:  non-distended, non-tender MSK/SPINE:  No gross deformities,  no edema SKIN:  no rash, atraumatic   *Additional and/or pertinent findings included in MDM below  Diagnostic and Interventional Summary    EKG Interpretation  Date/Time:    Ventricular Rate:    PR Interval:    QRS Duration:   QT Interval:    QTC Calculation:   R Axis:     Text Interpretation:         Labs Reviewed  COMPREHENSIVE METABOLIC PANEL - Abnormal; Notable for the following components:      Result Value   Glucose, Bld 118 (*)    Total Protein 8.5 (*)    AST 14 (*)    All other components within normal limits  URINALYSIS, ROUTINE W REFLEX MICROSCOPIC - Abnormal; Notable for the following components:   Color, Urine COLORLESS (*)    All other components within normal limits  LIPASE, BLOOD  CBC  PREGNANCY, URINE    No  orders to display    Medications  ondansetron (ZOFRAN-ODT) disintegrating tablet 4 mg (4 mg Oral Given 07/17/22 0121)  prochlorperazine (COMPAZINE) tablet 10 mg (10 mg Oral Given 07/17/22 0348)     Procedures  /  Critical Care Procedures  ED Course and Medical Decision Making  Initial Impression and Ddx Normal vital signs, abdomen soft and nontender with no rebound guarding or rigidity, no McBurney's point tenderness.  Overall favoring an infectious diarrheal process that is nonemergent.  Nothing to suggest appendicitis or any other surgical emergency.  Past medical/surgical history that increases complexity of ED encounter: None  Interpretation of Diagnostics I personally reviewed the laboratory assessment and my interpretation is as follows: No significant blood count or electrolyte disturbance    Patient Reassessment and Ultimate Disposition/Management     Patient appropriate for discharge with return precautions, symptomatic management.  Patient management required discussion with the following services or consulting groups:  None  Complexity of Problems Addressed Acute illness or injury that poses threat of life of bodily function  Additional Data Reviewed and Analyzed Further history obtained from: Further history from spouse/family member  Additional Factors Impacting ED Encounter Risk Prescriptions  Elmer Sow. Pilar Plate, MD [facility] Emergency Medicine [facility] [email or web address]  Final Clinical Impressions(s) / ED Diagnoses     ICD-10-CM   1. Diarrhea of presumed infectious origin  R19.7     2. Abdominal pain, unspecified abdominal location  R10.9       ED Discharge Orders          Ordered    dicyclomine (BENTYL) 20 MG tablet  2 times daily        07/17/22 0355    ondansetron (ZOFRAN-ODT) 4 MG disintegrating tablet  Every 8 hours PRN        07/17/22 0355    loperamide (IMODIUM) 2 MG capsule  4 times daily PRN        07/17/22 0355              Discharge Instructions Discussed with and Provided to Patient:    Discharge Instructions      You were evaluated in the Emergency Department and after careful evaluation, we did not find any emergent condition requiring admission or further testing in the hospital.  Your exam/testing today is overall reassuring.  Symptoms likely due to a viral diarrheal illness.  Take the Zofran as needed for nausea, take the Bentyl as needed for abdominal pain, take the Imodium as needed for diarrhea.  Please return to the Emergency Department if you experience  any worsening of your condition.   Thank you for allowing [place] to be a part of your care.      Maudie Flakes, MD 07/17/22 [phone]

## 2022-07-17 NOTE — Discharge Instructions (Signed)
You were evaluated in the Emergency Department and after careful evaluation, we did not find any emergent condition requiring admission or further testing in the hospital.  Your exam/testing today is overall reassuring.  Symptoms likely due to a viral diarrheal illness.  Take the Zofran as needed for nausea, take the Bentyl as needed for abdominal pain, take the Imodium as needed for diarrhea.  Please return to the Emergency Department if you experience any worsening of your condition.   Thank you for allowing [place] to be a part of your care.

## 2022-07-17 NOTE — ED Triage Notes (Signed)
Pt c/o generalized cramping-like abdominal pain since Wednesday. Associated with nausea without vomiting, diarrhea, bloating, and increased gas.

## 2022-07-20 ENCOUNTER — Telehealth: Payer: Self-pay | Admitting: *Deleted

## 2022-07-20 NOTE — Patient Outreach (Signed)
Care Coordination  07/20/2022  Tamara Church 06-01-1976 [id number]   Transition Care Management Follow-up Telephone Call Date of discharge and from where: 07/17/22 from [facility] How have you been since you were released from the hospital? "I am feeling much better" Any questions or concerns? No  Items Reviewed: Did the pt receive and understand the discharge instructions provided? Yes  Medications obtained and verified? Yes  Other? No  Any new allergies since your discharge? No  Dietary orders reviewed? Yes Do you have support at home? Yes   Home Care and Equipment/Supplies: Were home health services ordered? no If so, what is the name of the agency? N/A  Has the agency set up a time to come to the patient's home? not applicable Were any new equipment or medical supplies ordered?  No What is the name of the medical supply agency? N/A Were you able to get the supplies/equipment? not applicable Do you have any questions related to the use of the equipment or supplies? No  Functional Questionnaire: (I = Independent and D = Dependent) ADLs: I  Bathing/Dressing- I  Meal Prep- I  Eating- I  Maintaining continence- I  Transferring/Ambulation- I  Managing Meds- I  Follow up appointments reviewed:  PCP Hospital f/u appt confirmed?  N/A-ED visit   Specialist Hospital f/u appt confirmed? Yes  Scheduled to see GI on 08/04/22 @ 9:30am. Are transportation arrangements needed? No  If their condition worsens, is the pt aware to call PCP or go to the Emergency Dept.? Yes Was the patient provided with contact information for the PCP's office or ED? No Was to pt encouraged to call back with questions or concerns? Yes  Lurena Joiner RN, BSN [place]  [employer] manager

## 2022-07-30 ENCOUNTER — Encounter (HOSPITAL_BASED_OUTPATIENT_CLINIC_OR_DEPARTMENT_OTHER): Payer: Self-pay

## 2022-07-30 ENCOUNTER — Emergency Department (HOSPITAL_BASED_OUTPATIENT_CLINIC_OR_DEPARTMENT_OTHER)
Admission: EM | Admit: 2022-07-30 | Discharge: 2022-07-30 | Disposition: A | Discharge status: Home / Self Care | Payer: Medicaid Other | Attending: Emergency Medicine | Admitting: Emergency Medicine

## 2022-07-30 ENCOUNTER — Other Ambulatory Visit: Payer: Self-pay

## 2022-07-30 DIAGNOSIS — N189 Chronic kidney disease, unspecified: Secondary | ICD-10-CM | POA: Diagnosis not present

## 2022-07-30 DIAGNOSIS — Z79899 Other long term (current) drug therapy: Secondary | ICD-10-CM | POA: Insufficient documentation

## 2022-07-30 DIAGNOSIS — I129 Hypertensive chronic kidney disease with stage 1 through stage 4 chronic kidney disease, or unspecified chronic kidney disease: Secondary | ICD-10-CM | POA: Diagnosis not present

## 2022-07-30 DIAGNOSIS — N390 Urinary tract infection, site not specified: Secondary | ICD-10-CM | POA: Diagnosis not present

## 2022-07-30 DIAGNOSIS — R109 Unspecified abdominal pain: Secondary | ICD-10-CM | POA: Diagnosis present

## 2022-07-30 DIAGNOSIS — K2970 Gastritis, unspecified, without bleeding: Secondary | ICD-10-CM | POA: Diagnosis not present

## 2022-07-30 DIAGNOSIS — R1013 Epigastric pain: Secondary | ICD-10-CM

## 2022-07-30 LAB — CBC
HCT: 38.1 % (ref 36.0–46.0)
Hemoglobin: 12.1 g/dL (ref 12.0–15.0)
MCH: 28.5 pg (ref 26.0–34.0)
MCHC: 31.8 g/dL (ref 30.0–36.0)
MCV: 89.9 fL (ref 80.0–100.0)
Platelets: 283 10*3/uL (ref 150–400)
RBC: 4.24 MIL/uL (ref 3.87–5.11)
RDW: 13.8 % (ref 11.5–15.5)
WBC: 9 10*3/uL (ref 4.0–10.5)
nRBC: 0 % (ref 0.0–0.2)

## 2022-07-30 LAB — COMPREHENSIVE METABOLIC PANEL
ALT: 21 U/L (ref 0–44)
AST: 23 U/L (ref 15–41)
Albumin: 4.5 g/dL (ref 3.5–5.0)
Alkaline Phosphatase: 48 U/L (ref 38–126)
Anion gap: 13 (ref 5–15)
BUN: 13 mg/dL (ref 6–20)
CO2: 23 mmol/L (ref 22–32)
Calcium: 9.9 mg/dL (ref 8.9–10.3)
Chloride: 102 mmol/L (ref 98–111)
Creatinine, Ser: 0.85 mg/dL (ref 0.44–1.00)
GFR, Estimated: >60 mL/min (ref >60)
Glucose, Bld: 124 mg/dL — ABNORMAL HIGH (ref 70–99)
Potassium: 4.8 mmol/L (ref 3.5–5.1)
Sodium: 138 mmol/L (ref 135–145)
Total Bilirubin: 0.3 mg/dL (ref 0.3–1.2)
Total Protein: 8.8 g/dL — ABNORMAL HIGH (ref 6.5–8.1)

## 2022-07-30 LAB — URINALYSIS, ROUTINE W REFLEX MICROSCOPIC
Bilirubin Urine: NEGATIVE
Glucose, UA: NEGATIVE mg/dL
Hgb urine dipstick: NEGATIVE
Ketones, ur: NEGATIVE mg/dL
Leukocytes,Ua: NEGATIVE
Nitrite: NEGATIVE
Protein, ur: NEGATIVE mg/dL
Specific Gravity, Urine: <1.005 — ABNORMAL LOW (ref 1.005–1.030)
pH: 7.5 (ref 5.0–8.0)

## 2022-07-30 LAB — LIPASE, BLOOD: Lipase: 18 U/L (ref 11–51)

## 2022-07-30 MED ORDER — PANTOPRAZOLE SODIUM 40 MG IV SOLR
40.0000 mg | Freq: Once | INTRAVENOUS | Status: AC
  Administered 2022-07-30: 40 mg via INTRAVENOUS
  Filled 2022-07-30: qty 10

## 2022-07-30 MED ORDER — METOCLOPRAMIDE HCL 5 MG/ML IJ SOLN
5.0000 mg | Freq: Once | INTRAMUSCULAR | Status: AC
  Administered 2022-07-30: 5 mg via INTRAVENOUS
  Filled 2022-07-30: qty 2

## 2022-07-30 MED ORDER — MAALOX MAX 400-400-40 MG/5ML PO SUSP: 5.0000 mL | Freq: Four times a day (QID) | ORAL | 0 refills | Status: DC | PRN

## 2022-07-30 MED ORDER — FAMOTIDINE 20 MG PO TABS: 20.0000 mg | ORAL_TABLET | Freq: Two times a day (BID) | ORAL | 0 refills | Status: DC

## 2022-07-30 MED ORDER — LACTATED RINGERS IV BOLUS
1000.0000 mL | Freq: Once | INTRAVENOUS | Status: AC
  Administered 2022-07-30: 1000 mL via INTRAVENOUS

## 2022-07-30 NOTE — ED Notes (Signed)
Patient verbalizes understanding of discharge instructions. Opportunity for questioning and answers were provided. Patient discharged from ED.  °

## 2022-07-30 NOTE — Discharge Instructions (Signed)
  You have been seen in the Emergency Department (ED) for abdominal pain. Your workup was generally reassuring; h we suspect this is related to gastritis.  Take the medications as prescribed for your gastritis pains.  Please follow up with your primary care doctor as soon as possible regarding today's ED visit and the symptoms that are bothering you.  I have also provided a referral to gastroenterology to be seen in the outpatient setting for further work-up such as endoscopy.  Return to the ED if your abdominal pain worsens or fails to improve, you develop bloody vomiting, bloody diarrhea, you are unable to tolerate fluids due to vomiting, fever greater than 101, or any other concerning symptoms.

## 2022-07-30 NOTE — ED Notes (Signed)
Pt PO challanged

## 2022-07-30 NOTE — ED Provider Notes (Signed)
[facility] EMERGENCY DEPT Provider Note   CSN: [id number] Arrival date & time: 07/30/22  5993     History  Chief Complaint  Patient presents with   Abdominal Pain    Tamara Church is a 46 y.o. female.  With PMH of HTN, GERD, CKD, cholelithiasis who presents with abdominal pain and reflux.  Patient said she ate hibachi around 9:15 PM last night and then woke up around 330 with upper abdominal discomfort associated with burning reflux and discomfort.  She is endorsing feeling of nausea and wanting to vomit like something is stuck in her throat but has had no vomiting.  She has been able to drink water since this episode and kept it down.  She took some Zofran at home but this did not help with the discomfort feeling.  She has had normal bowel movements since this episode.  She had been feeling well prior to this.  She denies any fevers, chills, chest pain, shortness of breath, UTI sx, URI symptoms.  She has history of gallstones but this does not feel similar to her gallstones.  This feels similar to previous reflux episodes.   Abdominal Pain      Home Medications Prior to Admission medications   Medication Sig Start Date End Date Taking? Authorizing Provider  alum & mag hydroxide-simeth (MAALOX MAX) 400-400-40 MG/5ML suspension Take 5 mLs by mouth every 6 (six) hours as needed for indigestion. 07/30/22  Yes Mardene Sayer, MD  famotidine (PEPCID) 20 MG tablet Take 1 tablet (20 mg total) by mouth 2 (two) times daily. 07/30/22  Yes Mardene Sayer, MD  amLODipine (NORVASC) 10 MG tablet TAKE 1 TABLET BY MOUTH AT BEDTIME. PLEASE RETURN TO THE CLINIC FOR BP CHECK JAN 2023 01/27/22   Fayette Pho, MD  dicyclomine (BENTYL) 20 MG tablet Take 1 tablet (20 mg total) by mouth 2 (two) times daily. 07/17/22   Sabas Sous, MD  esomeprazole (NEXIUM) 40 MG capsule Take 1 capsule (40 mg total) by mouth daily. 07/03/22   Mansouraty, Netty Starring., MD  levonorgestrel (MIRENA) 20  MCG/24HR IUD 1 each by Intrauterine route once.    [provider]  loperamide (IMODIUM) 2 MG capsule Take 1 capsule (2 mg total) by mouth 4 (four) times daily as needed for diarrhea or loose stools. 07/17/22   Sabas Sous, MD  ondansetron (ZOFRAN) 4 MG tablet Take 1 tablet (4 mg total) by mouth every 6 (six) hours. 06/30/22   Mesner, Barbara Cower, MD  ondansetron (ZOFRAN-ODT) 4 MG disintegrating tablet Take 1 tablet (4 mg total) by mouth every 8 (eight) hours as needed for nausea or vomiting. 07/17/22   Sabas Sous, MD  spironolactone (ALDACTONE) 25 MG tablet TAKE 2 TABLETS BY MOUTH AT BEDTIME 03/11/22   Sabino Dick, DO      Allergies    Ace inhibitors, Angiotensin receptor blockers, and Hctz [hydrochlorothiazide]    Review of Systems   Review of Systems  Gastrointestinal:  Positive for abdominal pain.    Physical Exam Updated Vital Signs BP 129/81 (BP Location: Right Arm)   Pulse 88   Temp 98.6 F (37 C)   Resp 17   Ht 5\' 2"  (1.575 m)   Wt 90.7 kg   SpO2 96%   BMI 36.58 kg/m  Physical Exam Constitutional: Alert and oriented.  Slightly uncomfortable laying on right side in bed but no acute distress and nontoxic Eyes: Conjunctivae are normal. ENT      Head: Normocephalic and  atraumatic.      Nose: No congestion.      Mouth/Throat: Mucous membranes are moist.      Neck: No stridor. Cardiovascular: S1, S2, regular rate and rhythm Respiratory: Normal respiratory effort. Breath sounds are normal.  O2 sat 99 on RA Gastrointestinal: Soft and nondistended with upper epigastrium tenderness no rebound or guarding, not peritonitic Musculoskeletal: Normal range of motion in all extremities. Neurologic: Normal speech and language. No gross focal neurologic deficits are appreciated. Skin: Skin is warm, dry and intact. No rash noted. Psychiatric: Mood and affect are normal. Speech and behavior are normal.  ED Results / Procedures / Treatments   Labs (all labs ordered  are listed, but only abnormal results are displayed) Labs Reviewed  COMPREHENSIVE METABOLIC PANEL - Abnormal; Notable for the following components:      Result Value   Glucose, Bld 124 (*)    Total Protein 8.8 (*)    All other components within normal limits  URINALYSIS, ROUTINE W REFLEX MICROSCOPIC - Abnormal; Notable for the following components:   Color, Urine COLORLESS (*)    Specific Gravity, Urine <1.005 (*)    All other components within normal limits  CBC  LIPASE, BLOOD  POC URINE PREG, ED    EKG None  Radiology No results found.  Procedures Procedures    Medications Ordered in ED Medications  lactated ringers bolus 1,000 mL (0 mLs Intravenous Stopped 07/30/22 0914)  pantoprazole (PROTONIX) injection 40 mg (40 mg Intravenous Given 07/30/22 0745)  metoCLOPramide (REGLAN) injection 5 mg (5 mg Intravenous Given 07/30/22 0748)    ED Course/ Medical Decision Making/ A&P                            Medical Decision Making Tamara Church is a 46 y.o. female.  With PMH of HTN, GERD, CKD, cholelithiasis who presents with abdominal pain and reflux.   Based on the patient's epigastric pain, differential includes but is not limited to cholelithiasis, cholecystitis, hepatitis, GERD, PUD, pancreatitis. Less likely nephrolithiasis or pyelonephritis with no CVAT and no urinary symptoms. Less likely pulmonary source such as pneumonia with no cardiopulmonary complaints, no hypoxia, and no increased work of breathing. Less likely atypical ACS with no risk factors and no cardiopulmonary complaints.   Patient pain improved and tolerating p.o.  Her lab work-up was generally unremarkable.  Normal lipase 18 no concern for pancreatitis.  No transaminitis normal total bilirubin 0.3 with normal white blood cell count 9.0, no concern for symptomatic cholelithiasis or cholecystitis.  UA with no evidence of UTI.  No blood or RBCs suggestive of kidney stones.  Pain improved after medications provided  today.  Advise close follow-up with PCP and provided referral to GI and gave prescriptions for as needed Maalox and Pepcid.    Amount and/or Complexity of Data Reviewed Labs: ordered.  Risk OTC drugs. Prescription drug management.    Final Clinical Impression(s) / ED Diagnoses Final diagnoses:  Epigastric pain  Gastritis without bleeding, unspecified chronicity, unspecified gastritis type    Rx / DC Orders ED Discharge Orders          Ordered    famotidine (PEPCID) 20 MG tablet  2 times daily        07/30/22 1122    alum & mag hydroxide-simeth (MAALOX MAX) 400-400-40 MG/5ML suspension  Every 6 hours PRN        07/30/22 1122    Ambulatory referral to Gastroenterology  07/30/22 1122              Mardene Sayer, MD 07/30/22 1125

## 2022-07-30 NOTE — ED Triage Notes (Signed)
Pt presents to the ED with husband for generalized abdominal pain. Pt states that her stomach is "upset". Reports nausea. Denies vomiting or diarrhea. Pt has a hx of GERD and states that this feels similar. Ate hibachi last night and states that it usually makes her acid reflux worse.

## 2022-07-31 ENCOUNTER — Other Ambulatory Visit: Payer: Self-pay | Admitting: Family Medicine

## 2022-07-31 ENCOUNTER — Telehealth: Payer: Self-pay | Admitting: *Deleted

## 2022-07-31 NOTE — Patient Outreach (Signed)
  Care Coordination [facility] Note Transition Care Management Follow-up Telephone Call Date of discharge and from where: 07/30/22 from [facility] How have you been since you were released from the hospital? "I'm feeling much better" Any questions or concerns? No  Items Reviewed: Did the pt receive and understand the discharge instructions provided? Yes  Medications obtained and verified?  Patient will pick up medications today Other? No  Any new allergies since your discharge? No  Dietary orders reviewed? Yes Do you have support at home? Yes   Home Care and Equipment/Supplies: Were home health services ordered? no If so, what is the name of the agency? N/A  Has the agency set up a time to come to the patient's home? not applicable Were any new equipment or medical supplies ordered?  No What is the name of the medical supply agency? N/A Were you able to get the supplies/equipment? not applicable Do you have any questions related to the use of the equipment or supplies? No  Functional Questionnaire: (I = Independent and D = Dependent) ADLs: I  Bathing/Dressing- I  Meal Prep- I  Eating- I  Maintaining continence- I  Transferring/Ambulation- I  Managing Meds- I  Follow up appointments reviewed:  PCP Hospital f/u appt confirmed?  ED visit   Specialist Hospital f/u appt confirmed? Yes  Scheduled to see GI on 08/04/22 . Are transportation arrangements needed? No  If their condition worsens, is the pt aware to call PCP or go to the Emergency Dept.? Yes Was the patient provided with contact information for the PCP's office or ED? No Was to pt encouraged to call back with questions or concerns? Yes  Discussed and offered CM services. Patient declines at this time.  Estanislado Emms RN, BSN [place]  [employer]

## 2022-08-04 ENCOUNTER — Encounter: Payer: Self-pay | Admitting: Nurse Practitioner

## 2022-08-04 ENCOUNTER — Ambulatory Visit (INDEPENDENT_AMBULATORY_CARE_PROVIDER_SITE_OTHER): Payer: Medicaid Other | Admitting: Nurse Practitioner

## 2022-08-04 ENCOUNTER — Other Ambulatory Visit: Payer: Self-pay

## 2022-08-04 ENCOUNTER — Telehealth: Payer: Self-pay | Admitting: Nurse Practitioner

## 2022-08-04 VITALS — BP 140/98 | HR 99 | Ht 62.0 in | Wt 218.6 lb

## 2022-08-04 DIAGNOSIS — R1084 Generalized abdominal pain: Secondary | ICD-10-CM

## 2022-08-04 DIAGNOSIS — R110 Nausea: Secondary | ICD-10-CM

## 2022-08-04 MED ORDER — ONDANSETRON 4 MG PO TBDP: 4.0000 mg | ORAL_TABLET | Freq: Three times a day (TID) | ORAL | 3 refills | Status: DC | PRN

## 2022-08-04 MED ORDER — DICYCLOMINE HCL 20 MG PO TABS: 20.0000 mg | ORAL_TABLET | Freq: Two times a day (BID) | ORAL | 0 refills | Status: DC

## 2022-08-04 NOTE — Patient Instructions (Addendum)
If you are age 45 or older, your body mass index should be between 23-30. Your Body mass index is 39.98 kg/m. If this is out of the aforementioned range listed, please consider follow up with your Primary Care Provider.  If you are age 30 or younger, your body mass index should be between 19-25. Your Body mass index is 39.98 kg/m. If this is out of the aformentioned range listed, please consider follow up with your Primary Care Provider.   Start Miralax one capful daily.  You have been scheduled for a gastric emptying scan at [facility] on 08/18/22 at 7:30am. Please arrive at least 30 minutes prior to your appointment for registration. Please make certain not to have anything to eat or drink after midnight the night before your test. Hold all stomach medications (ex: Zofran, phenergan, Reglan) 24 hours prior to your test. If you need to reschedule your appointment, please contact radiology scheduling at [phone].  A gastric-emptying study measures how long it takes for food to move through your stomach. There are several ways to measure stomach emptying. In the most common test, you eat food that contains a small amount of radioactive material. A scanner that detects the movement of the radioactive material is placed over your abdomen to monitor the rate at which food leaves your stomach. This test normally takes about 4 hours to complete.   The [place] [facility] providers would like to encourage you to use [facility] to communicate with providers for non-urgent requests or questions.  Due to long hold times on the telephone, sending your provider a message by [facility] may be a faster and more efficient way to get a response.  Please allow 48 business hours for a response.  Please remember that this is for non-urgent requests.   It was a pleasure to see you today!  Thank you for trusting me with your gastrointestinal care!

## 2022-08-04 NOTE — Telephone Encounter (Signed)
Patient needed a refill of Bentyl per office note Paula's not patient could have refill if she did not have any at home.

## 2022-08-04 NOTE — Progress Notes (Signed)
Chief Complaint: Nausea, vomiting, abdominal pain   Assessment &  Plan   # 45 yo female with chronic,  intermittent generalized abdominal pain and nausea without vomiting. Multiple ED visits since her last visit here a year ago. Previous EGD and CT scan unrevealing. Labs repeatedly normal.  Schedule for gastric emptying study Will refill Zofran. Tamara Church will hold it 8 hours prior to gastric emptying study Follow up appt 12/12 Treat constipation, see below Bentyl on home med list but Tamara Church isn't familiar with it. Tamara Church will check meds at home. I do think it is worth trying for the pain. Happy to call in Rx if Tamara Church doesn't have any at home.   # Mild constipation, possibly related to zofran.  Trial of daily miralax  # GERD Continue daily Nexium 40 mg  # Colon cancer screening.  I don't think Tamara Church would be able to tolerate a bowel prep right now. Can discuss at follow up on 12/12   # Hyperproteinemia, low a/g ratio.     HPI   46 year old female known to Dr. Rush Landmark with a past medical history of hypertension, anemia, cholelithiasis, hiatal hernia, GERD (microscopic esophagitis noted on biopsies), Kidney stones.  See PMH /PSH for additional history   Tamara Church has been followed here for epigastric pain and GERD . Tamara Church was last seen October 2022 at which time Tamara Church was doing well from a GERD standpoint .  We decreased her PPI from 40 mg to 20 mg. Plan was to increase dose back to 40 mg daily if needed .  Tamara Church was asked to follow-up in 1 year at which time we would discuss colon cancer screening since Tamara Church would be 46 years old.   Interval History:  Since her last visit here 1 year ago Tamara Church has been to the emergency department at least 6 times for different GI complaints.   CBCs, LFTs and lipase have been repeatedly normal.  Abdominal imaging was not been done since the CT scan in September 2021 ordered by Dr. Rush Landmark  Tamara Church increased PPI back to 40 mg within a week of her last appt here a year ago.    Tamara Church is crying, feels miserable. Tells me Tamara Church has been sick for months. Tamara Church has frequent nausea without vomiting. Tamara Church has generalized abdominal pain which gets worse with food. Unable to eat hardly anything for 1.5 months now.  Says Tamara Church has lost weight but hard to tell by Epic weights given wide fluctuations from week to week. Tamara Church has generalized abdominal pain which gets worse if Tamara Church eats. Zofran helps the nausea. Tamara Church tried Maalox but it doesn't help. Compliant with daily PPI.  Pepcid is on home med list doesn't take it. Dicyclomine on home med list but doesn't seem to be taking it either ( not even familiar with it) . Tamara Church isn't taking any NSAIDs   A month or so ago Tamara Church was seen in ED for diarrhea, told Tamara Church had a stomach virus. Now Tamara Church is having mild constipation, possibly secondary to zofran.   Previous GI Evaluation  EGD Aug 2021 - No gross lesions in esophagus. Biopsied for EoE/LoE. - Z-line irregular, 36 cm from the incisors. - Erythematous mucosa in the gastric body. No other gross lesions in the stomach. Biopsied. - No gross lesions in the duodenal bulb, in the first portion of the duodenum and in the second portion of the duodenum. Biopsied.   Labs:     Latest Ref Rng & Units 07/30/2022  8:00 AM 07/17/2022    1:15 AM 07/01/2022    5:08 PM  CBC  WBC 4.0 - 10.5 K/uL 9.0  7.3  6.2   Hemoglobin 12.0 - 15.0 g/dL 12.1  12.3  13.0   Hematocrit 36.0 - 46.0 % 38.1  38.4  39.8   Platelets 150 - 400 K/uL 283  302  271        Latest Ref Rng & Units 07/30/2022    8:00 AM 07/17/2022    1:15 AM 07/01/2022    5:08 PM  Hepatic Function  Total Protein 6.5 - 8.1 g/dL 8.8  8.5  9.3   Albumin 3.5 - 5.0 g/dL 4.5  4.5  4.6   AST 15 - 41 U/L _0 ALT 0 - 44 U/L _1 Alk Phosphatase 38 - 126 U/L 48  48  49   Total Bilirubin 0.3 - 1.2 mg/dL 0.3  0.4  0.3     Past Medical History:  Diagnosis Date   Abdominal pain, epigastric 07/27/2020   Anemia    ANEMIA 09/07/2008    Qualifier: Diagnosis of  By: Doreene Nest MD, Kim     Bacterial vaginosis 05/08/2020   Calculus of gallbladder without cholecystitis without obstruction 07/05/2021   Constipation 05/18/2020   Early satiety 05/18/2020   Gastritis and gastroduodenitis 07/27/2020   GERD (gastroesophageal reflux disease)    Hypertension    Non-intractable vomiting 05/18/2020   Palpitations 03/22/2020   Prolonged QT interval 10/04/2020   Screening-pulmonary TB 10/08/2021   Sore throat 09/18/2020   Unintentional weight loss 05/18/2020   UTI (urinary tract infection) 04/23/2020    Past Surgical History:  Procedure Laterality Date   NO PAST SURGERIES      Current Medications, Allergies, Family History and Social History were reviewed in [employer] record.     Current Outpatient Medications  Medication Sig Dispense Refill   alum & mag hydroxide-simeth (MAALOX MAX) 008-676-19 MG/5ML suspension Take 5 mLs by mouth every 6 (six) hours as needed for indigestion. 355 mL 0   amLODipine (NORVASC) 10 MG tablet Take 1 tablet (10 mg total) by mouth daily. 90 tablet 3   dicyclomine (BENTYL) 20 MG tablet Take 1 tablet (20 mg total) by mouth 2 (two) times daily. 20 tablet 0   esomeprazole (NEXIUM) 40 MG capsule Take 1 capsule (40 mg total) by mouth daily. 30 capsule 0   famotidine (PEPCID) 20 MG tablet Take 1 tablet (20 mg total) by mouth 2 (two) times daily. 30 tablet 0   levonorgestrel (MIRENA) 20 MCG/24HR IUD 1 each by Intrauterine route once.     loperamide (IMODIUM) 2 MG capsule Take 1 capsule (2 mg total) by mouth 4 (four) times daily as needed for diarrhea or loose stools. 12 capsule 0   ondansetron (ZOFRAN) 4 MG tablet Take 1 tablet (4 mg total) by mouth every 6 (six) hours. 30 tablet 0   ondansetron (ZOFRAN-ODT) 4 MG disintegrating tablet Take 1 tablet (4 mg total) by mouth every 8 (eight) hours as needed for nausea or vomiting. 20 tablet 0   spironolactone (ALDACTONE) 25 MG tablet TAKE 2 TABLETS BY  MOUTH AT BEDTIME 180 tablet 0   No current facility-administered medications for this visit.    Review of Systems: No chest pain. No shortness of breath. No urinary complaints.    Physical Exam  Wt Readings from Last 3 Encounters:  07/30/22 200 lb (90.7 kg)  07/17/22 219 lb (99.3 kg)  07/03/22 219 lb 6.4 oz (99.5 kg)    BP (!) 140/98 (BP Location: Left Arm, Patient Position: Sitting, Cuff Size: Normal)   Pulse 99   Ht _0  (1.575 m)   Wt 218 lb 9.6 oz (99.2 kg)   SpO2 96%   BMI 39.98 kg/m  Constitutional:  Generally well appearing female.  Psychiatric: Crying but pleasant. EENT: Pupils normal.  Conjunctivae are normal. No scleral icterus. Neck supple.  Cardiovascular: Normal rate, regular rhythm. No edema Pulmonary/chest: Effort normal and breath sounds normal. No wheezing, rales or rhonchi. Abdominal: Soft, nondistended, nontender. Bowel sounds active throughout. There are no masses palpable. No hepatomegaly. Neurological: Alert and oriented to person place and time. Skin: Skin is warm and dry. No rashes noted.  Tye Savoy, NP  08/04/2022, 8:09 AM

## 2022-08-04 NOTE — Progress Notes (Signed)
Attending Physician's Attestation   I have reviewed the chart.   I agree with the Advanced Practitioner's note, impression, and recommendations with any updates as below.    Clarabell Matsuoka Mansouraty, MD [facility] Office # [phone]  

## 2022-08-04 NOTE — Telephone Encounter (Signed)
Inbound call from patient stating she discussed if she had a little blue pill that she is suppose to take for abdominal pain at the ov today. Patient states she does not have that pill and would like for it to be sent into the pharmacy. Please advise.

## 2022-08-05 ENCOUNTER — Telehealth: Payer: Self-pay | Admitting: Nurse Practitioner

## 2022-08-05 ENCOUNTER — Encounter (HOSPITAL_BASED_OUTPATIENT_CLINIC_OR_DEPARTMENT_OTHER): Payer: Self-pay

## 2022-08-05 ENCOUNTER — Other Ambulatory Visit: Payer: Self-pay

## 2022-08-05 ENCOUNTER — Emergency Department (HOSPITAL_BASED_OUTPATIENT_CLINIC_OR_DEPARTMENT_OTHER)
Admission: EM | Admit: 2022-08-05 | Discharge: 2022-08-05 | Disposition: A | Discharge status: Home / Self Care | Payer: Medicaid Other | Attending: Emergency Medicine | Admitting: Emergency Medicine

## 2022-08-05 DIAGNOSIS — R110 Nausea: Secondary | ICD-10-CM

## 2022-08-05 DIAGNOSIS — R9431 Abnormal electrocardiogram [ECG] [EKG]: Secondary | ICD-10-CM | POA: Diagnosis not present

## 2022-08-05 DIAGNOSIS — R112 Nausea with vomiting, unspecified: Secondary | ICD-10-CM | POA: Insufficient documentation

## 2022-08-05 DIAGNOSIS — K5900 Constipation, unspecified: Secondary | ICD-10-CM | POA: Diagnosis not present

## 2022-08-05 LAB — COMPREHENSIVE METABOLIC PANEL
ALT: 24 U/L (ref 0–44)
AST: 18 U/L (ref 15–41)
Albumin: 4.4 g/dL (ref 3.5–5.0)
Alkaline Phosphatase: 43 U/L (ref 38–126)
Anion gap: 12 (ref 5–15)
BUN: 8 mg/dL (ref 6–20)
CO2: 23 mmol/L (ref 22–32)
Calcium: 9.7 mg/dL (ref 8.9–10.3)
Chloride: 103 mmol/L (ref 98–111)
Creatinine, Ser: 0.77 mg/dL (ref 0.44–1.00)
GFR, Estimated: >60 mL/min (ref >60)
Glucose, Bld: 106 mg/dL — ABNORMAL HIGH (ref 70–99)
Potassium: 3.7 mmol/L (ref 3.5–5.1)
Sodium: 138 mmol/L (ref 135–145)
Total Bilirubin: 0.4 mg/dL (ref 0.3–1.2)
Total Protein: 8.3 g/dL — ABNORMAL HIGH (ref 6.5–8.1)

## 2022-08-05 LAB — CBC WITH DIFFERENTIAL/PLATELET
Abs Immature Granulocytes: 0.01 10*3/uL (ref 0.00–0.07)
Basophils Absolute: 0.1 10*3/uL (ref 0.0–0.1)
Basophils Relative: 1 %
Eosinophils Absolute: 0 10*3/uL (ref 0.0–0.5)
Eosinophils Relative: 0 %
HCT: 37.6 % (ref 36.0–46.0)
Hemoglobin: 12.3 g/dL (ref 12.0–15.0)
Immature Granulocytes: 0 %
Lymphocytes Relative: 17 %
Lymphs Abs: 1.3 10*3/uL (ref 0.7–4.0)
MCH: 28.9 pg (ref 26.0–34.0)
MCHC: 32.7 g/dL (ref 30.0–36.0)
MCV: 88.3 fL (ref 80.0–100.0)
Monocytes Absolute: 0.4 10*3/uL (ref 0.1–1.0)
Monocytes Relative: 5 %
Neutro Abs: 6.1 10*3/uL (ref 1.7–7.7)
Neutrophils Relative %: 77 %
Platelets: 274 10*3/uL (ref 150–400)
RBC: 4.26 MIL/uL (ref 3.87–5.11)
RDW: 13.6 % (ref 11.5–15.5)
WBC: 7.8 10*3/uL (ref 4.0–10.5)
nRBC: 0 % (ref 0.0–0.2)

## 2022-08-05 LAB — LIPASE, BLOOD: Lipase: <10 U/L — ABNORMAL LOW (ref 11–51)

## 2022-08-05 MED ORDER — METOCLOPRAMIDE HCL 10 MG PO TABS: 10.0000 mg | ORAL_TABLET | Freq: Four times a day (QID) | ORAL | 0 refills | Status: DC

## 2022-08-05 MED ORDER — METOCLOPRAMIDE HCL 5 MG/ML IJ SOLN
10.0000 mg | Freq: Once | INTRAMUSCULAR | Status: AC
  Administered 2022-08-05: 10 mg via INTRAVENOUS
  Filled 2022-08-05: qty 2

## 2022-08-05 MED ORDER — DIPHENHYDRAMINE HCL 50 MG/ML IJ SOLN
25.0000 mg | Freq: Once | INTRAMUSCULAR | Status: AC
  Administered 2022-08-05: 25 mg via INTRAVENOUS
  Filled 2022-08-05: qty 1

## 2022-08-05 MED ORDER — ACETAMINOPHEN 500 MG PO TABS
1000.0000 mg | ORAL_TABLET | Freq: Once | ORAL | Status: AC
  Administered 2022-08-05: 1000 mg via ORAL
  Filled 2022-08-05: qty 2

## 2022-08-05 MED ORDER — LACTATED RINGERS IV BOLUS
2000.0000 mL | Freq: Once | INTRAVENOUS | Status: AC
  Administered 2022-08-05: 2000 mL via INTRAVENOUS

## 2022-08-05 MED ORDER — MAALOX MULTI SYMPTOM MAX ST 400-400-40 MG/5ML PO SUSP: 15.0000 mL | Freq: Four times a day (QID) | ORAL | 0 refills | Status: DC | PRN

## 2022-08-05 MED ORDER — FAMOTIDINE 20 MG PO TABS: 20.0000 mg | ORAL_TABLET | Freq: Two times a day (BID) | ORAL | 0 refills | Status: DC

## 2022-08-05 MED ORDER — FAMOTIDINE 20 MG PO TABS
20.0000 mg | ORAL_TABLET | Freq: Once | ORAL | Status: AC
  Administered 2022-08-05: 20 mg via ORAL
  Filled 2022-08-05: qty 1

## 2022-08-05 MED ORDER — PANTOPRAZOLE SODIUM 20 MG PO TBEC: 40.0000 mg | DELAYED_RELEASE_TABLET | Freq: Every day | ORAL | 0 refills | Status: DC

## 2022-08-05 NOTE — Telephone Encounter (Signed)
Patient is calling stating she was told yesterday at her appt if she wasn't feeling better to call us back, says she woke up still feeling bad. Please advise.

## 2022-08-05 NOTE — ED Provider Notes (Signed)
[facility] EMERGENCY DEPT Provider Note   CSN: [id number] Arrival date & time: 08/05/22  7939     History Chief Complaint  Patient presents with   Abdominal Pain   Nausea   Emesis   Constipation    HPI Tamara Church is a 46 y.o. female presenting for chief complaint of recurrent nausea and vomiting.  She has had an extensive 21-month history of daily nausea and vomiting.  Especially worse at night.  She endorses a history of similar 2 years ago where she was diagnosed with peptic ulcer disease.  She has not been taking any medications except for omeprazole lately.  She does endorse occasional NSAID utilization.  She denies fevers or chills, nausea vomiting, syncope shortness of breath at this time.  Her symptoms were much more severe earlier today.  Denies bilious or bloody vomiting.  Was seen by GI yesterday..   Patient's recorded medical, surgical, social, medication list and allergies were reviewed in the Snapshot window as part of the initial history.   Review of Systems   Review of Systems  Constitutional:  Negative for chills and fever.  HENT:  Negative for ear pain and sore throat.   Eyes:  Negative for pain and visual disturbance.  Respiratory:  Negative for cough and shortness of breath.   Cardiovascular:  Negative for chest pain and palpitations.  Gastrointestinal:  Positive for nausea and vomiting. Negative for abdominal pain.  Genitourinary:  Negative for dysuria and hematuria.  Musculoskeletal:  Negative for arthralgias and back pain.  Skin:  Negative for color change and rash.  Neurological:  Negative for seizures and syncope.  All other systems reviewed and are negative.   Physical Exam Updated Vital Signs BP (!) 149/93   Pulse 98   Temp 98.6 F (37 C)   Resp 18   Ht 5\' 2"  (1.575 m)   Wt 98.9 kg   SpO2 97%   BMI 39.87 kg/m  Physical Exam Vitals and nursing note reviewed.  Constitutional:      General: She is not in acute distress.     Appearance: She is well-developed.  HENT:     Head: Normocephalic and atraumatic.  Eyes:     Conjunctiva/sclera: Conjunctivae normal.  Cardiovascular:     Rate and Rhythm: Normal rate and regular rhythm.     Heart sounds: No murmur heard. Pulmonary:     Effort: Pulmonary effort is normal. No respiratory distress.     Breath sounds: Normal breath sounds.  Abdominal:     General: There is no distension.     Palpations: Abdomen is soft.     Tenderness: There is no abdominal tenderness. There is no right CVA tenderness, left CVA tenderness, guarding or rebound.  Musculoskeletal:        General: No swelling or tenderness. Normal range of motion.     Cervical back: Neck supple.  Skin:    General: Skin is warm and dry.  Neurological:     General: No focal deficit present.     Mental Status: She is alert and oriented to person, place, and time. Mental status is at baseline.     Cranial Nerves: No cranial nerve deficit.      ED Course/ Medical Decision Making/ A&P    Procedures Procedures   Medications Ordered in ED Medications  famotidine (PEPCID) tablet 20 mg (has no administration in time range)    Medical Decision Making:    Tamara Church is a 46 y.o. female  who presented to the ED today with nausea vomiting detailed above.     Patient's presentation is complicated by their history of multiple comorbid medical conditions, elevated BMI.  Patient placed on continuous vitals and telemetry monitoring while in ED which was reviewed periodically.   Complete initial physical exam performed, notably the patient  was hemodynamically stable in no acute distress.  No abdominal tenderness..      Reviewed and confirmed nursing documentation for past medical history, family history, social history.    Initial Assessment:   With the patient's presentation of nausea and vomiting , most likely diagnosis is recurrence of her peptic ulcer disease, nonspecific gastritis. Other diagnoses  were considered including (but not limited to) small bowel obstruction, appendicitis, cholecystitis, perforation. These are considered less likely due to history of present illness and physical exam findings.   This is most consistent with an acute life/limb threatening illness complicated by underlying chronic conditions.  Initial Plan:  Lipase to evaluate for pancreatitis Screening labs including CBC and Metabolic panel to evaluate for infectious or metabolic etiology of disease.  EKG to evaluate for cardiac pathology. Objective evaluation as below reviewed with plan for close reassessment  Initial Study Results:   Laboratory  All laboratory results reviewed without evidence of clinically relevant pathology.    EKG EKG was reviewed independently. Rate, rhythm, axis, intervals all examined and without medically relevant abnormality. ST segments without concerns for elevations.    Final Assessment and Plan:   Observe patient in emergency department for 3 hours.  After administration of Pepcid, Reglan, Benadryl, she has had complete resolution of symptoms.  She feels grossly improved after 2 L IV fluid.   Given her tolerance of p.o. intake at this time, I believe that patient is likely suffering from peptic ulcers without current laboratory or clinical evidence of perforation.  Consider progressing to CT scan to completely rule out this diagnosis, however given her well appearance, significant duration of symptoms, toleration of p.o. intake, I do not believe that CT scan would be diagnostic at this time.  If her symptoms were to progress and she was to have worsening pain or intolerance of p.o., she would need to return for further evaluation and management.  Discussed all the above with the patient who agrees.  We will plan to have patient follow-up with her gastroenterologist in the outpatient setting.  Disposition:  I have considered need for hospitalization, however, considering all of the  above, I believe this patient is stable for discharge at this time.  Patient/family educated about specific return precautions for given chief complaint and symptoms.  Patient/family educated about follow-up with PCP and gastroenterology.     Patient/family expressed understanding of return precautions and need for follow-up. Patient spoken to regarding all imaging and laboratory results and appropriate follow up for these results. All education provided in verbal form with additional information in written form. Time was allowed for answering of patient questions. Patient discharged.    Emergency Department Medication Summary:   Medications  famotidine (PEPCID) tablet 20 mg (20 mg Oral Given 08/05/22 1031)  acetaminophen (TYLENOL) tablet 1,000 mg (1,000 mg Oral Given 08/05/22 1038)  lactated ringers bolus 2,000 mL (2,000 mLs Intravenous New Bag/Given 08/05/22 1051)  metoCLOPramide (REGLAN) injection 10 mg (10 mg Intravenous Given 08/05/22 1054)  diphenhydrAMINE (BENADRYL) injection 25 mg (25 mg Intravenous Given 08/05/22 1053)         Clinical Impression: No diagnosis found.   Data Unavailable   Final Clinical  Impression(s) / ED Diagnoses Final diagnoses:  None    Rx / DC Orders ED Discharge Orders     None         Glyn Ade, MD 08/05/22 1554

## 2022-08-05 NOTE — Discharge Instructions (Addendum)
You are seen today for stomach upset.  We have discussed your ongoing care.  Overall you are well-appearing with normal labs at this time.  Your symptoms improved with her medications today.  I will recommend you follow-up with your gastroenterologist for further recommendations and care and management.  We have prescribed further supportive care medications including Pepcid, pantoprazole and Maalox for you to take daily.  If you decide to switch to the pantoprazole, please stop taking your omeprazole.  If you want to switch to the Reglan, please stop taking the Zofran.  Call your primary care provider for further medication recommendations.

## 2022-08-05 NOTE — ED Triage Notes (Signed)
Pt reports intermittent nausea, vomiting, and constipation for the past few months. Pt states she saw her GI doctor and was told she might just be constipate. She took miralax with relief and then began experiencing nausea and vomiting this morning. Pt AxOx4.

## 2022-08-05 NOTE — Telephone Encounter (Signed)
Pt states she feels like the zofran and bentyl are not helping. Pt took zofran at 4 am and also took bentyl this morning. Pt vomited once this morning and is very nauseated. Pt tried to eat some grits this morning but stated she was unable to due to nausea.

## 2022-08-06 NOTE — Telephone Encounter (Signed)
Gastric emptying study moved to 08/10/22 at 7:30 am. Let pt know she needs to arrive at 7 am at [facility] hospital and be NPO and hold GI meds such as zofran after midnight. Pt verbalized understanding.

## 2022-08-07 ENCOUNTER — Emergency Department (HOSPITAL_BASED_OUTPATIENT_CLINIC_OR_DEPARTMENT_OTHER): Payer: Medicaid Other

## 2022-08-07 ENCOUNTER — Encounter (HOSPITAL_BASED_OUTPATIENT_CLINIC_OR_DEPARTMENT_OTHER): Payer: Self-pay | Admitting: Emergency Medicine

## 2022-08-07 ENCOUNTER — Other Ambulatory Visit: Payer: Self-pay

## 2022-08-07 ENCOUNTER — Telehealth: Payer: Self-pay | Admitting: Nurse Practitioner

## 2022-08-07 ENCOUNTER — Emergency Department (HOSPITAL_BASED_OUTPATIENT_CLINIC_OR_DEPARTMENT_OTHER)
Admission: EM | Admit: 2022-08-07 | Discharge: 2022-08-07 | Disposition: A | Discharge status: Home / Self Care | Payer: Medicaid Other | Attending: Emergency Medicine | Admitting: Emergency Medicine

## 2022-08-07 DIAGNOSIS — R109 Unspecified abdominal pain: Secondary | ICD-10-CM | POA: Diagnosis not present

## 2022-08-07 DIAGNOSIS — K8020 Calculus of gallbladder without cholecystitis without obstruction: Secondary | ICD-10-CM | POA: Diagnosis not present

## 2022-08-07 DIAGNOSIS — R1013 Epigastric pain: Secondary | ICD-10-CM

## 2022-08-07 DIAGNOSIS — K449 Diaphragmatic hernia without obstruction or gangrene: Secondary | ICD-10-CM | POA: Insufficient documentation

## 2022-08-07 DIAGNOSIS — R112 Nausea with vomiting, unspecified: Secondary | ICD-10-CM

## 2022-08-07 LAB — COMPREHENSIVE METABOLIC PANEL
ALT: 19 U/L (ref 0–44)
AST: 14 U/L — ABNORMAL LOW (ref 15–41)
Albumin: 4.5 g/dL (ref 3.5–5.0)
Alkaline Phosphatase: 41 U/L (ref 38–126)
Anion gap: 12 (ref 5–15)
BUN: 6 mg/dL (ref 6–20)
CO2: 25 mmol/L (ref 22–32)
Calcium: 10.1 mg/dL (ref 8.9–10.3)
Chloride: 101 mmol/L (ref 98–111)
Creatinine, Ser: 0.86 mg/dL (ref 0.44–1.00)
GFR, Estimated: >60 mL/min (ref >60)
Glucose, Bld: 98 mg/dL (ref 70–99)
Potassium: 4 mmol/L (ref 3.5–5.1)
Sodium: 138 mmol/L (ref 135–145)
Total Bilirubin: 0.4 mg/dL (ref 0.3–1.2)
Total Protein: 8.6 g/dL — ABNORMAL HIGH (ref 6.5–8.1)

## 2022-08-07 LAB — CBC
HCT: 38 % (ref 36.0–46.0)
Hemoglobin: 12.5 g/dL (ref 12.0–15.0)
MCH: 28.9 pg (ref 26.0–34.0)
MCHC: 32.9 g/dL (ref 30.0–36.0)
MCV: 87.8 fL (ref 80.0–100.0)
Platelets: 293 10*3/uL (ref 150–400)
RBC: 4.33 MIL/uL (ref 3.87–5.11)
RDW: 13.4 % (ref 11.5–15.5)
WBC: 7 10*3/uL (ref 4.0–10.5)
nRBC: 0 % (ref 0.0–0.2)

## 2022-08-07 LAB — URINALYSIS, ROUTINE W REFLEX MICROSCOPIC
Bilirubin Urine: NEGATIVE
Glucose, UA: NEGATIVE mg/dL
Hgb urine dipstick: NEGATIVE
Ketones, ur: 15 mg/dL — AB
Leukocytes,Ua: NEGATIVE
Nitrite: NEGATIVE
Protein, ur: NEGATIVE mg/dL
Specific Gravity, Urine: <1.005 — ABNORMAL LOW (ref 1.005–1.030)
pH: 6 (ref 5.0–8.0)

## 2022-08-07 LAB — PREGNANCY, URINE: Preg Test, Ur: NEGATIVE

## 2022-08-07 LAB — LIPASE, BLOOD: Lipase: 11 U/L (ref 11–51)

## 2022-08-07 MED ORDER — PROMETHAZINE HCL 25 MG RE SUPP: 25.0000 mg | Freq: Four times a day (QID) | RECTAL | 0 refills | Status: DC | PRN

## 2022-08-07 MED ORDER — SODIUM CHLORIDE 0.9 % IV BOLUS
1000.0000 mL | Freq: Once | INTRAVENOUS | Status: AC
  Administered 2022-08-07: 1000 mL via INTRAVENOUS

## 2022-08-07 MED ORDER — DIPHENHYDRAMINE HCL 50 MG/ML IJ SOLN
25.0000 mg | Freq: Once | INTRAMUSCULAR | Status: AC
  Administered 2022-08-07: 25 mg via INTRAVENOUS
  Filled 2022-08-07: qty 1

## 2022-08-07 MED ORDER — FAMOTIDINE IN NACL 20-0.9 MG/50ML-% IV SOLN
20.0000 mg | Freq: Once | INTRAVENOUS | Status: AC
  Administered 2022-08-07: 20 mg via INTRAVENOUS
  Filled 2022-08-07: qty 50

## 2022-08-07 MED ORDER — METOCLOPRAMIDE HCL 5 MG/ML IJ SOLN
10.0000 mg | Freq: Once | INTRAMUSCULAR | Status: AC
  Administered 2022-08-07: 10 mg via INTRAVENOUS
  Filled 2022-08-07: qty 2

## 2022-08-07 MED ORDER — ONDANSETRON 4 MG PO TBDP: 4.0000 mg | ORAL_TABLET | Freq: Once | ORAL | Status: DC | PRN

## 2022-08-07 MED ORDER — IOHEXOL 300 MG/ML  SOLN
100.0000 mL | Freq: Once | INTRAMUSCULAR | Status: AC | PRN
  Administered 2022-08-07: 85 mL via INTRAVENOUS

## 2022-08-07 MED ORDER — MORPHINE SULFATE (PF) 4 MG/ML IV SOLN
4.0000 mg | Freq: Once | INTRAVENOUS | Status: DC
  Filled 2022-08-07: qty 1

## 2022-08-07 NOTE — ED Provider Notes (Signed)
[facility] EMERGENCY DEPT Provider Note   CSN: [id number] Arrival date & time: 08/07/22  1037     History {Add pertinent medical, surgical, social history, OB history to HPI:1} Chief Complaint  Patient presents with   Abdominal Pain    Tamara Church is a 46 y.o. female who presents emergency department with chief complaint of abdominal pain, nausea and vomiting.  This is a recurrent issue for the patient.  She was seen for the same complaint 2 days ago and has had 8 visits for the same in the past 6 months.  Patient states that her vomiting and abdominal pain are unprovoked.  Review of EMR shows that her last CT scan in the emergency department was in September 2021.  Patient is following with GI and is scheduled for a gastric emptying study in 3 days.  Patient reports that after treatment in the ER 2 days ago for her epigastric abdominal pain she was feeling much better however was only able to eat a very small amount yesterday.  This morning patient woke up with severe abdominal pain and vomiting.  She was unable to hold down her acid reflux medication.  She did use Zofran but continues to feel extremely nauseous.  Review of previous CT imaging shows that the patient has had small sliding hiatal hernia at that time.  No other acute findings.   Abdominal Pain      Home Medications Prior to Admission medications   Medication Sig Start Date End Date Taking? Authorizing Provider  alum & mag hydroxide-simeth (MAALOX MULTI SYMPTOM MAX ST) 400-400-40 MG/5ML suspension Take 15 mLs by mouth every 6 (six) hours as needed for indigestion. 08/05/22   Glyn Ade, MD  amLODipine (NORVASC) 10 MG tablet Take 1 tablet (10 mg total) by mouth daily. 07/31/22   Fayette Pho, MD  dicyclomine (BENTYL) 20 MG tablet Take 1 tablet (20 mg total) by mouth 2 (two) times daily. 08/04/22   Meredith Pel, NP  esomeprazole (NEXIUM) 40 MG capsule Take 1 capsule (40 mg total) by mouth  daily. 07/03/22   Mansouraty, Netty Starring., MD  famotidine (PEPCID) 20 MG tablet Take 1 tablet (20 mg total) by mouth 2 (two) times daily. 08/05/22   Glyn Ade, MD  levonorgestrel (MIRENA) 20 MCG/24HR IUD 1 each by Intrauterine route once.    [provider]  metoCLOPramide (REGLAN) 10 MG tablet Take 1 tablet (10 mg total) by mouth every 6 (six) hours. 08/05/22   Glyn Ade, MD  pantoprazole (PROTONIX) 20 MG tablet Take 2 tablets (40 mg total) by mouth daily. 08/05/22 09/04/22  Glyn Ade, MD  spironolactone (ALDACTONE) 25 MG tablet TAKE 2 TABLETS BY MOUTH AT BEDTIME 03/11/22   Sabino Dick, DO      Allergies    Ace inhibitors, Angiotensin receptor blockers, and Hctz [hydrochlorothiazide]    Review of Systems   Review of Systems  Gastrointestinal:  Positive for abdominal pain.    Physical Exam Updated Vital Signs BP (!) 151/113 (BP Location: Left Arm)   Pulse 98   Temp 98.5 F (36.9 C)   Resp 20   LMP 07/07/2022 (Approximate)   SpO2 100%  Physical Exam Vitals and nursing note reviewed.  Constitutional:      General: She is not in acute distress.    Appearance: She is well-developed. She is ill-appearing. She is not toxic-appearing or diaphoretic.  HENT:     Head: Normocephalic and atraumatic.     Right Ear: External ear normal.  Left Ear: External ear normal.     Nose: Nose normal.     Mouth/Throat:     Mouth: Mucous membranes are moist.  Eyes:     General: No scleral icterus.    Conjunctiva/sclera: Conjunctivae normal.  Cardiovascular:     Rate and Rhythm: Normal rate and regular rhythm.     Heart sounds: Normal heart sounds. No murmur heard.    No friction rub. No gallop.  Pulmonary:     Effort: Pulmonary effort is normal. No respiratory distress.     Breath sounds: Normal breath sounds.  Abdominal:     General: Bowel sounds are normal. There is no distension.     Palpations: Abdomen is soft. There is no mass.     Tenderness:  There is generalized abdominal tenderness. There is no guarding.  Musculoskeletal:     Cervical back: Normal range of motion.  Skin:    General: Skin is warm and dry.  Neurological:     Mental Status: She is alert and oriented to person, place, and time.  Psychiatric:        Behavior: Behavior normal.     ED Results / Procedures / Treatments   Labs (all labs ordered are listed, but only abnormal results are displayed) Labs Reviewed  URINALYSIS, ROUTINE W REFLEX MICROSCOPIC - Abnormal; Notable for the following components:      Result Value   Color, Urine COLORLESS (*)    Specific Gravity, Urine <1.005 (*)    Ketones, ur 15 (*)    All other components within normal limits  LIPASE, BLOOD  COMPREHENSIVE METABOLIC PANEL  CBC  PREGNANCY, URINE    EKG None  Radiology No results found.  Procedures Procedures  {Document cardiac monitor, telemetry assessment procedure when appropriate:1}  Medications Ordered in ED Medications  ondansetron (ZOFRAN-ODT) disintegrating tablet 4 mg (has no administration in time range)  diphenhydrAMINE (BENADRYL) injection 25 mg (has no administration in time range)  sodium chloride 0.9 % bolus 1,000 mL (has no administration in time range)  metoCLOPramide (REGLAN) injection 10 mg (has no administration in time range)  famotidine (PEPCID) IVPB 20 mg premix (has no administration in time range)    ED Course/ Medical Decision Making/ A&P                           Medical Decision Making Amount and/or Complexity of Data Reviewed Labs: ordered. Radiology: ordered.  Risk Prescription drug management.   ***  {Document critical care time when appropriate:1} {Document review of labs and clinical decision tools ie heart score, Chads2Vasc2 etc:1}  {Document your independent review of radiology images, and any outside records:1} {Document your discussion with family members, caretakers, and with consultants:1} {Document social determinants of  health affecting pt's care:1} {Document your decision making why or why not admission, treatments were needed:1} Final Clinical Impression(s) / ED Diagnoses Final diagnoses:  None    Rx / DC Orders ED Discharge Orders     None

## 2022-08-07 NOTE — ED Triage Notes (Signed)
Pt c/o diffuse abdominal pains off and on for many months.  Was seen in ED 2 days ago and treated w/IVF and nausea medication.  Pt reports this helped her symptoms initially, but pain has since returned and pt states she has no appetite and a feeling of fullness and generalized weakness.  Pt vomited once yesterday.

## 2022-08-07 NOTE — Telephone Encounter (Signed)
Inbound call from patient states she is currently at ER, says they have found gallstones on an ultrasound. Please advise.

## 2022-08-07 NOTE — ED Notes (Signed)
Discharge paperwork given and verbally understood. 

## 2022-08-07 NOTE — Discharge Instructions (Addendum)

## 2022-08-10 ENCOUNTER — Encounter (HOSPITAL_COMMUNITY)
Admission: RE | Admit: 2022-08-10 | Discharge: 2022-08-10 | Disposition: A | Discharge status: Home / Self Care | Payer: Medicaid Other | Source: Ambulatory Visit | Attending: Nurse Practitioner | Admitting: Nurse Practitioner

## 2022-08-10 ENCOUNTER — Telehealth: Payer: Self-pay | Admitting: *Deleted

## 2022-08-10 DIAGNOSIS — R110 Nausea: Secondary | ICD-10-CM | POA: Diagnosis not present

## 2022-08-10 DIAGNOSIS — R1084 Generalized abdominal pain: Secondary | ICD-10-CM | POA: Diagnosis not present

## 2022-08-10 DIAGNOSIS — R6881 Early satiety: Secondary | ICD-10-CM | POA: Diagnosis not present

## 2022-08-10 MED ORDER — TECHNETIUM TC 99M SULFUR COLLOID
2.1800 | Freq: Once | INTRAVENOUS | Status: AC | PRN
  Administered 2022-08-10: 2.18 via INTRAVENOUS

## 2022-08-10 NOTE — Patient Outreach (Signed)
  Care Coordination [facility] Note Transition Care Management Follow-up Telephone Call Date of discharge and from where: 08/07/22 from [facility] How have you been since you were released from the hospital? "I am doing good" Any questions or concerns? No  Items Reviewed: Did the pt receive and understand the discharge instructions provided? Yes  Medications obtained and verified? Yes  Other? No  Any new allergies since your discharge? No  Dietary orders reviewed? Yes Do you have support at home? Yes   Home Care and Equipment/Supplies: Were home health services ordered? no If so, what is the name of the agency? N/A  Has the agency set up a time to come to the patient's home? not applicable Were any new equipment or medical supplies ordered?  No What is the name of the medical supply agency? N/A Were you able to get the supplies/equipment? not applicable Do you have any questions related to the use of the equipment or supplies? No  Functional Questionnaire: (I = Independent and D = Dependent) ADLs: I  Bathing/Dressing- I  Meal Prep- I  Eating- I  Maintaining continence- I  Transferring/Ambulation- I  Managing Meds- I  Follow up appointments reviewed:  PCP Hospital f/u appt confirmed?  N/A, ED visit   Specialist Hospital f/u appt confirmed? Yes  Scheduled to see GI on 09/01/22 @ 10:10am. Are transportation arrangements needed? No  If their condition worsens, is the pt aware to call PCP or go to the Emergency Dept.? Yes Was the patient provided with contact information for the PCP's office or ED? No Was to pt encouraged to call back with questions or concerns? Yes   Estanislado Emms RN, BSN [place]  [employer]

## 2022-08-10 NOTE — Telephone Encounter (Signed)
Patient wanted Gunnar Fusi to review the CT scan from her visit in the ER on Friday. Pt is concerned about the gallstones and hiatal hernia. Pt stated she is currently at Gastric emptying study.

## 2022-08-11 ENCOUNTER — Telehealth: Payer: Self-pay

## 2022-08-11 ENCOUNTER — Emergency Department (HOSPITAL_COMMUNITY): Payer: Medicaid Other

## 2022-08-11 ENCOUNTER — Other Ambulatory Visit: Payer: Self-pay

## 2022-08-11 ENCOUNTER — Emergency Department (HOSPITAL_COMMUNITY)
Admission: EM | Admit: 2022-08-11 | Discharge: 2022-08-11 | Disposition: A | Discharge status: Home / Self Care | Payer: Medicaid Other | Attending: Emergency Medicine | Admitting: Emergency Medicine

## 2022-08-11 DIAGNOSIS — K8020 Calculus of gallbladder without cholecystitis without obstruction: Secondary | ICD-10-CM | POA: Diagnosis not present

## 2022-08-11 DIAGNOSIS — R1011 Right upper quadrant pain: Secondary | ICD-10-CM | POA: Diagnosis not present

## 2022-08-11 DIAGNOSIS — R109 Unspecified abdominal pain: Secondary | ICD-10-CM | POA: Diagnosis present

## 2022-08-11 DIAGNOSIS — R1013 Epigastric pain: Secondary | ICD-10-CM

## 2022-08-11 DIAGNOSIS — I10 Essential (primary) hypertension: Secondary | ICD-10-CM | POA: Insufficient documentation

## 2022-08-11 LAB — URINALYSIS, ROUTINE W REFLEX MICROSCOPIC
Bilirubin Urine: NEGATIVE
Glucose, UA: NEGATIVE mg/dL
Hgb urine dipstick: NEGATIVE
Ketones, ur: 80 mg/dL — AB
Leukocytes,Ua: NEGATIVE
Nitrite: NEGATIVE
Protein, ur: 30 mg/dL — AB
Specific Gravity, Urine: 1.024 (ref 1.005–1.030)
pH: 5 (ref 5.0–8.0)

## 2022-08-11 LAB — RAPID URINE DRUG SCREEN, HOSP PERFORMED
Amphetamines: NOT DETECTED
Barbiturates: NOT DETECTED
Benzodiazepines: NOT DETECTED
Cocaine: NOT DETECTED
Opiates: NOT DETECTED
Tetrahydrocannabinol: NOT DETECTED

## 2022-08-11 LAB — COMPREHENSIVE METABOLIC PANEL
ALT: 23 U/L (ref 0–44)
AST: 18 U/L (ref 15–41)
Albumin: 4.4 g/dL (ref 3.5–5.0)
Alkaline Phosphatase: 49 U/L (ref 38–126)
Anion gap: 15 (ref 5–15)
BUN: 10 mg/dL (ref 6–20)
CO2: 20 mmol/L — ABNORMAL LOW (ref 22–32)
Calcium: 9.6 mg/dL (ref 8.9–10.3)
Chloride: 100 mmol/L (ref 98–111)
Creatinine, Ser: 0.93 mg/dL (ref 0.44–1.00)
GFR, Estimated: >60 mL/min (ref >60)
Glucose, Bld: 93 mg/dL (ref 70–99)
Potassium: 3.9 mmol/L (ref 3.5–5.1)
Sodium: 135 mmol/L (ref 135–145)
Total Bilirubin: 0.9 mg/dL (ref 0.3–1.2)
Total Protein: 9.2 g/dL — ABNORMAL HIGH (ref 6.5–8.1)

## 2022-08-11 LAB — CBC WITH DIFFERENTIAL/PLATELET
Abs Immature Granulocytes: 0.02 10*3/uL (ref 0.00–0.07)
Basophils Absolute: 0 10*3/uL (ref 0.0–0.1)
Basophils Relative: 1 %
Eosinophils Absolute: 0 10*3/uL (ref 0.0–0.5)
Eosinophils Relative: 0 %
HCT: 41.4 % (ref 36.0–46.0)
Hemoglobin: 13.3 g/dL (ref 12.0–15.0)
Immature Granulocytes: 0 %
Lymphocytes Relative: 22 %
Lymphs Abs: 1.6 10*3/uL (ref 0.7–4.0)
MCH: 28.9 pg (ref 26.0–34.0)
MCHC: 32.1 g/dL (ref 30.0–36.0)
MCV: 89.8 fL (ref 80.0–100.0)
Monocytes Absolute: 0.4 10*3/uL (ref 0.1–1.0)
Monocytes Relative: 5 %
Neutro Abs: 5.2 10*3/uL (ref 1.7–7.7)
Neutrophils Relative %: 72 %
Platelets: 297 10*3/uL (ref 150–400)
RBC: 4.61 MIL/uL (ref 3.87–5.11)
RDW: 13.7 % (ref 11.5–15.5)
WBC: 7.3 10*3/uL (ref 4.0–10.5)
nRBC: 0 % (ref 0.0–0.2)

## 2022-08-11 LAB — PREGNANCY, URINE: Preg Test, Ur: NEGATIVE

## 2022-08-11 LAB — LIPASE, BLOOD: Lipase: 24 U/L (ref 11–51)

## 2022-08-11 MED ORDER — SODIUM CHLORIDE 0.9 % IV BOLUS
1000.0000 mL | Freq: Once | INTRAVENOUS | Status: AC
  Administered 2022-08-11: 1000 mL via INTRAVENOUS

## 2022-08-11 MED ORDER — FAMOTIDINE 20 MG PO TABS
20.0000 mg | ORAL_TABLET | Freq: Once | ORAL | Status: AC
  Administered 2022-08-11: 20 mg via ORAL
  Filled 2022-08-11: qty 1

## 2022-08-11 MED ORDER — ONDANSETRON 8 MG PO TBDP
8.0000 mg | ORAL_TABLET | Freq: Once | ORAL | Status: AC
  Administered 2022-08-11: 8 mg via ORAL
  Filled 2022-08-11: qty 1

## 2022-08-11 MED ORDER — DROPERIDOL 2.5 MG/ML IJ SOLN
2.5000 mg | Freq: Once | INTRAMUSCULAR | Status: AC
  Administered 2022-08-11: 2.5 mg via INTRAVENOUS
  Filled 2022-08-11: qty 2

## 2022-08-11 MED ORDER — PANTOPRAZOLE 80MG IVPB - SIMPLE MED
80.0000 mg | Freq: Once | INTRAVENOUS | Status: AC
  Administered 2022-08-11: 80 mg via INTRAVENOUS
  Filled 2022-08-11: qty 80

## 2022-08-11 NOTE — Telephone Encounter (Signed)
I agree with NP Guenther's thoughts that neck steps in this patient's evaluation of recurrent issues is likely an upper endoscopy.  Functional GI disorder becoming more possible in this patient but as she has not had an endoscopic reevaluation in a while it is reasonable for [place] to ensure nothing else is developed.

## 2022-08-11 NOTE — ED Triage Notes (Signed)
Pt complains of diffuse abdominal pain. States this pain started months ago and comes and goes. Pt states she was seen yesterday for same but says she has not had any relief. Pt reports belching and nausea.

## 2022-08-11 NOTE — ED Provider Notes (Signed)
Emergency Department Provider Note   I have reviewed the triage vital signs and the nursing notes.   HISTORY  Chief Complaint Abdominal Pain   HPI Tamara Church is a 46 y.o. female presents to the ED with abdominal pain. Pain feels similar to ongoing abdominal pain in the past. She describes mainly right sided pain and has been seen in the ED multiple times in the past with recent CT imaging. She follows with GI. Notes pain has been ongoing for months/years. No new features today.    Past Medical History:  Diagnosis Date   Abdominal pain, epigastric 07/27/2020   Anemia    ANEMIA 09/07/2008   Qualifier: Diagnosis of  By: Earnest Bailey MD, Kim     Bacterial vaginosis 05/08/2020   Calculus of gallbladder without cholecystitis without obstruction 07/05/2021   Constipation 05/18/2020   Early satiety 05/18/2020   Gastritis and gastroduodenitis 07/27/2020   GERD (gastroesophageal reflux disease)    Hypertension    Non-intractable vomiting 05/18/2020   Palpitations 03/22/2020   Prolonged QT interval 10/04/2020   Screening-pulmonary TB 10/08/2021   Sore throat 09/18/2020   Unintentional weight loss 05/18/2020   UTI (urinary tract infection) 04/23/2020    Review of Systems  Constitutional: No fever/chills Eyes: No visual changes. ENT: No sore throat. Cardiovascular: Denies chest pain. Respiratory: Denies shortness of breath. Gastrointestinal: Positive abdominal pain. Positive nausea, no vomiting.  No diarrhea.  No constipation. Genitourinary: Negative for dysuria. Musculoskeletal: Negative for back pain. Skin: Negative for rash. Neurological: Negative for headaches, focal weakness or numbness.  ____________________________________________   PHYSICAL EXAM:  VITAL SIGNS: ED Triage Vitals  Enc Vitals Group     BP 08/11/22 0802 (!) 143/104     Pulse Rate 08/11/22 0802 (!) 107     Resp 08/11/22 0759 18     Temp 08/11/22 0802 98.4 F (36.9 C)     Temp Source 08/11/22 0802 Oral      SpO2 08/11/22 0802 100 %     Weight 08/11/22 0800 218 lb (98.9 kg)     Height 08/11/22 0800 5\' 2"  (1.575 m)   Constitutional: Alert and oriented. Well appearing and in no acute distress. Eyes: Conjunctivae are normal Head: Atraumatic. Nose: No congestion/rhinnorhea. Mouth/Throat: Mucous membranes are moist.  Neck: No stridor.   Cardiovascular: Normal rate, regular rhythm. Good peripheral circulation. Grossly normal heart sounds.   Respiratory: Normal respiratory effort.  No retractions. Lungs CTAB. Gastrointestinal: Soft with minimal tenderness in the epigastric region. Negative Murphy's sign. No distention.  Musculoskeletal: No lower extremity tenderness nor edema. No gross deformities of extremities. Neurologic:  Normal speech and language. No gross focal neurologic deficits are appreciated.  Skin:  Skin is warm, dry and intact. No rash noted.  ____________________________________________   LABS (all labs ordered are listed, but only abnormal results are displayed)  Labs Reviewed  COMPREHENSIVE METABOLIC PANEL - Abnormal; Notable for the following components:      Result Value   CO2 20 (*)    Total Protein 9.2 (*)    All other components within normal limits  URINALYSIS, ROUTINE W REFLEX MICROSCOPIC - Abnormal; Notable for the following components:   Ketones, ur 80 (*)    Protein, ur 30 (*)    Bacteria, UA RARE (*)    All other components within normal limits  CBC WITH DIFFERENTIAL/PLATELET  LIPASE, BLOOD  RAPID URINE DRUG SCREEN, HOSP PERFORMED  PREGNANCY, URINE   ____________________________________________  EKG   EKG Interpretation  Date/Time:  Tuesday  August 11 2022 15:46:46 EST Ventricular Rate:  95 PR Interval:  154 QRS Duration: 93 QT Interval:  360 QTC Calculation: 453 R Axis:   20 Text Interpretation: Sinus rhythm Probable left atrial enlargement Borderline T abnormalities, anterior leads Confirmed by Alona Bene [phone]) on 08/11/2022 4:42:43 PM         ____________________________________________   PROCEDURES  Procedure(s) performed:   Procedures  None ____________________________________________   INITIAL IMPRESSION / ASSESSMENT AND PLAN / ED COURSE  Pertinent labs & imaging results that were available during my care of the patient were reviewed by me and considered in my medical decision making (see chart for details).   This patient is Presenting for Evaluation of abdominal pain, which does require a range of treatment options, and is a complaint that involves a high risk of morbidity and mortality.  The Differential Diagnoses includes but is not exclusive to acute cholecystitis, intrathoracic causes for epigastric abdominal pain, gastritis, duodenitis, pancreatitis, small bowel or large bowel obstruction, abdominal aortic aneurysm, hernia, gastritis, etc.   Critical Interventions-    Medications  ondansetron (ZOFRAN-ODT) disintegrating tablet 8 mg (8 mg Oral Given 08/11/22 1219)  famotidine (PEPCID) tablet 20 mg (20 mg Oral Given 08/11/22 1219)  sodium chloride 0.9 % bolus 1,000 mL (0 mLs Intravenous Stopped 08/11/22 1707)  droperidol (INAPSINE) 2.5 MG/ML injection 2.5 mg (2.5 mg Intravenous Given 08/11/22 1543)  pantoprazole (PROTONIX) 80 mg /NS 100 mL IVPB (0 mg Intravenous Stopped 08/11/22 1649)    Reassessment after intervention:  Symptoms improved.     Clinical Laboratory Tests Ordered, included lipase and LFTs are normal. No leukocytosis.   Radiologic Tests Ordered, included RUQ [place]. I independently interpreted the images and agree with radiology interpretation.   Cardiac Monitor Tracing which shows NSR.   Social Determinants of Health Risk denies EtOH.   Medical Decision Making: Summary:  Patient presents with continued intermittent abdominal pain. Reviewed recent notes and prior history of ED imaging. Exam and presentation is overall reassuring here. Plan for RUQ [place], labs, and reassess.   Reevaluation  with update and discussion with patient and family. Plan for continued GI follow up. Considering cholelithiasis as a possible etiology for pain, will give contact information for general surgery to call for outpatient follow up.   Disposition: discharge  ____________________________________________  FINAL CLINICAL IMPRESSION(S) / ED DIAGNOSES  Final diagnoses:  Epigastric pain  Calculus of gallbladder without cholecystitis without obstruction      Note:  This document was prepared using Dragon voice recognition software and may include unintentional dictation errors.  Alona Bene, MD, [facility] Emergency Medicine    Daly Whipkey, Arlyss Repress, MD 08/19/22 1910

## 2022-08-11 NOTE — Telephone Encounter (Signed)
Patient called requesting to speak with Midge Minium states she is currently at the Ed sick as a dog.

## 2022-08-11 NOTE — Telephone Encounter (Signed)
Spoke with pt. And gave pt Paula's message about her CT scan. Also gave pt Paula's message about results to gastric emptying study. Pt denied any THC use and stated that zofran doesn't really help. Pt stated she does get relief for a short time and she is taking zofran every 6-8 hours but she is unable to eat anything except applesauce. Pt stated she tried to eat 2 bites of baked chicken last night and stated she feels like that is why she had to go back to the ER today.

## 2022-08-11 NOTE — Telephone Encounter (Signed)
Patient Tamara Church on nurse line from emergency department. She reports continued issues with abdominal pain.   Returned call to patient. She reports that she is "very sick." She reports having issues with abdominal pain for the last month. She has had multiple ED visits and is established with [facility]. She has concerns for small hiatal hernia and gall stones.   She is only able to tolerate small amount of fluids and is not able to eat anything except for applesauce. She also reports that after taking spirolactone last night she began to have palpitations. Due to current symptoms, advised patient to stay in ED for further evaluation.   Patient verbalizes understanding. Offered to schedule patient follow up visit with our office, however, patient states that she will call back after speaking with ED provider if appointment is needed.   Veronda Prude, RN

## 2022-08-11 NOTE — Telephone Encounter (Signed)
Patient called to follow up on previous message seeking further advise. Also wants Dr. Meridee Score to be aware.

## 2022-08-11 NOTE — Discharge Instructions (Signed)
You were seen in the emergency department today with abdominal pain.  Please continue your home medications and following with [facility].  I have listed the name of the general surgery team to call for a follow-up appointment regarding your gallstones.  They can discuss management options with you at that appointment.

## 2022-08-11 NOTE — ED Provider Triage Note (Signed)
Emergency Medicine Provider Triage Evaluation Note  Tamara Church , a 46 y.o. female  was evaluated in triage.  Pt complains of nausea, emesis, suprapubic abdominal pain.  Patient states symptoms been present for the past month.  She has been seen in the emergency department multiple times for the same symptoms.  Reports nauseated feeling at baseline and inability to tolerate p.o. secondary to increased nausea and episodes of emesis.  Gastric emptying study performed yesterday was within normal limits.  Has not seen GI in the office.  Has been compliant with famotidine, Protonix, Maalox, Zofran with minimal temporary relief of symptoms.  Denies fever, chills, night sweats, hematemesis, chest pain, shortness of breath, vaginal symptoms, change in bowel habits.  Reports some urinary frequency but denies dysuria, hematuria.  Denies alcohol and marijuana use.  Review of Systems  Positive: See above Negative:   Physical Exam  BP 123/89 (BP Location: Left Arm)   Pulse (!) 104   Temp 98 F (36.7 C) (Oral)   Resp 18   Ht 5\' 2"  (1.575 m)   Wt 98.9 kg   LMP 07/07/2022 (Within Days) Comment: Pt states she is due to start in a day or so. Already feeling cramping.  SpO2 97%   BMI 39.87 kg/m  Gen:   Awake, no distress   Resp:  Normal effort  MSK:   Moves extremities without difficulty  Other:  Mild suprapubic tenderness  Medical Decision Making  Medically screening exam initiated at 12:12 PM.  Appropriate orders placed.  07/09/2022 was informed that the remainder of the evaluation will be completed by another provider, this initial triage assessment does not replace that evaluation, and the importance of remaining in the ED until their evaluation is complete.     [name], Tamara Garter 08/11/22 1214

## 2022-08-12 ENCOUNTER — Telehealth: Payer: Self-pay

## 2022-08-12 ENCOUNTER — Other Ambulatory Visit: Payer: Self-pay

## 2022-08-12 DIAGNOSIS — R110 Nausea: Secondary | ICD-10-CM

## 2022-08-12 DIAGNOSIS — K2100 Gastro-esophageal reflux disease with esophagitis, without bleeding: Secondary | ICD-10-CM

## 2022-08-12 DIAGNOSIS — R1084 Generalized abdominal pain: Secondary | ICD-10-CM

## 2022-08-12 NOTE — Patient Outreach (Signed)
Transition Care Management Follow-up Telephone Call Date of discharge and from where: 08/11/22 How have you been since you were released from the hospital? I feel a lot better today. My mom made me some potatoe soup and I was able to keep it down Any questions or concerns? Yes  Items Reviewed: Did the pt receive and understand the discharge instructions provided? Yes  Medications obtained and verified? Yes  Other? No  Any new allergies since your discharge? No  Dietary orders reviewed? No Do you have support at home? Yes   Home Care and Equipment/Supplies: Were home health services ordered? not applicable If so, what is the name of the agency?  Has the agency set up a time to come to the patient's home? not applicable Were any new equipment or medical supplies ordered?  No What is the name of the medical supply agency?  Were you able to get the supplies/equipment? not applicable Do you have any questions related to the use of the equipment or supplies? No  Functional Questionnaire: (I = Independent and D = Dependent) ADLs: I  Bathing/Dressing- I  Meal Prep- I  Eating- I  Maintaining continence- I  Transferring/Ambulation- I  Managing Meds- I  Follow up appointments reviewed:  PCP Hospital f/u appt confirmed? No  Scheduled on 09/01/22 GI Specialist Hospital f/u appt confirmed? No  Scheduled to see . Are transportation arrangements needed? No  If their condition worsens, is the pt aware to call PCP or go to the Emergency Dept.? Yes Was the patient provided with contact information for the PCP's office or ED? Yes Was to pt encouraged to call back with questions or concerns? Yes  BSW completed an SDOH assessment, Patient stated she does have a cut off notice and needs assistance with her utility bill. BSW emailed utility resources to patient. Gus Puma, BSW, [facility]  [place]  High Risk Managed Medicaid Team  [phone]

## 2022-08-12 NOTE — Telephone Encounter (Signed)
Ambulatory referral placed 

## 2022-08-12 NOTE — Telephone Encounter (Signed)
Pt scheduled for EGD on 08/29/22 at 12 pm. Instructions mailed to pt. Pt verbalized understanding.

## 2022-08-13 ENCOUNTER — Emergency Department (HOSPITAL_COMMUNITY): Payer: Medicaid Other

## 2022-08-13 ENCOUNTER — Encounter (HOSPITAL_COMMUNITY): Payer: Self-pay | Admitting: Emergency Medicine

## 2022-08-13 ENCOUNTER — Emergency Department (HOSPITAL_COMMUNITY)
Admission: EM | Admit: 2022-08-13 | Discharge: 2022-08-13 | Disposition: A | Discharge status: Home / Self Care | Payer: Medicaid Other | Attending: Emergency Medicine | Admitting: Emergency Medicine

## 2022-08-13 ENCOUNTER — Other Ambulatory Visit: Payer: Self-pay

## 2022-08-13 DIAGNOSIS — K8020 Calculus of gallbladder without cholecystitis without obstruction: Secondary | ICD-10-CM | POA: Insufficient documentation

## 2022-08-13 DIAGNOSIS — R1010 Upper abdominal pain, unspecified: Secondary | ICD-10-CM | POA: Diagnosis present

## 2022-08-13 DIAGNOSIS — K219 Gastro-esophageal reflux disease without esophagitis: Secondary | ICD-10-CM | POA: Insufficient documentation

## 2022-08-13 DIAGNOSIS — R1011 Right upper quadrant pain: Secondary | ICD-10-CM | POA: Diagnosis not present

## 2022-08-13 DIAGNOSIS — Z79899 Other long term (current) drug therapy: Secondary | ICD-10-CM | POA: Insufficient documentation

## 2022-08-13 DIAGNOSIS — I10 Essential (primary) hypertension: Secondary | ICD-10-CM | POA: Diagnosis not present

## 2022-08-13 LAB — CBC WITH DIFFERENTIAL/PLATELET
Abs Immature Granulocytes: 0.02 10*3/uL (ref 0.00–0.07)
Basophils Absolute: 0 10*3/uL (ref 0.0–0.1)
Basophils Relative: 1 %
Eosinophils Absolute: 0 10*3/uL (ref 0.0–0.5)
Eosinophils Relative: 0 %
HCT: 42 % (ref 36.0–46.0)
Hemoglobin: 13.7 g/dL (ref 12.0–15.0)
Immature Granulocytes: 0 %
Lymphocytes Relative: 19 %
Lymphs Abs: 1.4 10*3/uL (ref 0.7–4.0)
MCH: 28.7 pg (ref 26.0–34.0)
MCHC: 32.6 g/dL (ref 30.0–36.0)
MCV: 88.1 fL (ref 80.0–100.0)
Monocytes Absolute: 0.5 10*3/uL (ref 0.1–1.0)
Monocytes Relative: 6 %
Neutro Abs: 5.7 10*3/uL (ref 1.7–7.7)
Neutrophils Relative %: 74 %
Platelets: 272 10*3/uL (ref 150–400)
RBC: 4.77 MIL/uL (ref 3.87–5.11)
RDW: 13.4 % (ref 11.5–15.5)
WBC: 7.7 10*3/uL (ref 4.0–10.5)
nRBC: 0 % (ref 0.0–0.2)

## 2022-08-13 LAB — COMPREHENSIVE METABOLIC PANEL
ALT: 22 U/L (ref 0–44)
AST: 19 U/L (ref 15–41)
Albumin: 4.7 g/dL (ref 3.5–5.0)
Alkaline Phosphatase: 53 U/L (ref 38–126)
Anion gap: 10 (ref 5–15)
BUN: 7 mg/dL (ref 6–20)
CO2: 22 mmol/L (ref 22–32)
Calcium: 10 mg/dL (ref 8.9–10.3)
Chloride: 105 mmol/L (ref 98–111)
Creatinine, Ser: 0.87 mg/dL (ref 0.44–1.00)
GFR, Estimated: >60 mL/min (ref >60)
Glucose, Bld: 101 mg/dL — ABNORMAL HIGH (ref 70–99)
Potassium: 3.9 mmol/L (ref 3.5–5.1)
Sodium: 137 mmol/L (ref 135–145)
Total Bilirubin: 0.9 mg/dL (ref 0.3–1.2)
Total Protein: 9.5 g/dL — ABNORMAL HIGH (ref 6.5–8.1)

## 2022-08-13 LAB — HCG, SERUM, QUALITATIVE: Preg, Serum: NEGATIVE

## 2022-08-13 LAB — LIPASE, BLOOD: Lipase: 30 U/L (ref 11–51)

## 2022-08-13 LAB — TROPONIN I (HIGH SENSITIVITY): Troponin I (High Sensitivity): 4 ng/L (ref <18)

## 2022-08-13 MED ORDER — LACTATED RINGERS IV BOLUS
1000.0000 mL | Freq: Once | INTRAVENOUS | Status: AC
  Administered 2022-08-13: 1000 mL via INTRAVENOUS

## 2022-08-13 MED ORDER — KETOROLAC TROMETHAMINE 15 MG/ML IJ SOLN: 15.0000 mg | Freq: Once | INTRAMUSCULAR | Status: DC

## 2022-08-13 MED ORDER — ONDANSETRON HCL 4 MG/2ML IJ SOLN
4.0000 mg | Freq: Once | INTRAMUSCULAR | Status: AC
  Administered 2022-08-13: 4 mg via INTRAVENOUS
  Filled 2022-08-13: qty 2

## 2022-08-13 MED ORDER — OXYCODONE HCL 5 MG PO TABS
5.0000 mg | ORAL_TABLET | ORAL | Status: AC
  Administered 2022-08-13: 5 mg via ORAL
  Filled 2022-08-13: qty 1

## 2022-08-13 MED ORDER — FENTANYL CITRATE PF 50 MCG/ML IJ SOSY
50.0000 ug | PREFILLED_SYRINGE | Freq: Once | INTRAMUSCULAR | Status: AC
  Administered 2022-08-13: 50 ug via INTRAVENOUS
  Filled 2022-08-13: qty 1

## 2022-08-13 NOTE — ED Provider Notes (Signed)
[facility]-EMERGENCY DEPT Provider Note   CSN: [id number] Arrival date & time: 08/13/22  1355     History  Chief Complaint  Patient presents with   Emesis   Nausea   Abdominal Pain   Diarrhea    Tamara Church is a 46 y.o. female.   Emesis Associated symptoms: abdominal pain and diarrhea   Abdominal Pain Associated symptoms: diarrhea and vomiting   Diarrhea Associated symptoms: abdominal pain and vomiting      This is a 19 old female with medical history of GERD, hypertension, anemia, early satiety, gastritis, cholelithiasis without evidence of cholecystitis or obstruction, prediabetes, morbid obesity presenting to the emergency department due to upper abdominal pain.  This been going intermittently for the last month, associated with nausea, vomiting and some episodes of diarrhea.  Severity of the pain waxes and wanes, is worse after eating.  She was able to tolerate applesauce yesterday but is now nauseated and unable to tolerate p.o. she has tried Zofran, Reglan, Bentyl with short-term improvement.  She is followed by GI and had normal gastric during study yesterday.  She has been seen the ED for this 4 times this month, this is 5th visit.   Denies any previous abdominal surgeries.  No chest pain, hemoptysis, bloody stool, back pain, syncope, dysuria, hematuria, vaginal discharge.  Home Medications Prior to Admission medications   Medication Sig Start Date End Date Taking? Authorizing Provider  alum & mag hydroxide-simeth (MAALOX MULTI SYMPTOM MAX ST) 400-400-40 MG/5ML suspension Take 15 mLs by mouth every 6 (six) hours as needed for indigestion. 08/05/22   Glyn Ade, MD  amLODipine (NORVASC) 10 MG tablet Take 1 tablet (10 mg total) by mouth daily. 07/31/22   Fayette Pho, MD  dicyclomine (BENTYL) 20 MG tablet Take 1 tablet (20 mg total) by mouth 2 (two) times daily. 08/04/22   Meredith Pel, NP  esomeprazole (NEXIUM) 40 MG capsule  Take 1 capsule (40 mg total) by mouth daily. 07/03/22   Mansouraty, Netty Starring., MD  famotidine (PEPCID) 20 MG tablet Take 1 tablet (20 mg total) by mouth 2 (two) times daily. 08/05/22   Glyn Ade, MD  levonorgestrel (MIRENA) 20 MCG/24HR IUD 1 each by Intrauterine route once.    [provider]  metoCLOPramide (REGLAN) 10 MG tablet Take 1 tablet (10 mg total) by mouth every 6 (six) hours. 08/05/22   Glyn Ade, MD  pantoprazole (PROTONIX) 20 MG tablet Take 2 tablets (40 mg total) by mouth daily. 08/05/22 09/04/22  Glyn Ade, MD  promethazine (PHENERGAN) 25 MG suppository Place 1 suppository (25 mg total) rectally every 6 (six) hours as needed for nausea or vomiting. 08/07/22   Harris, Cammy Copa, PA-C  spironolactone (ALDACTONE) 25 MG tablet TAKE 2 TABLETS BY MOUTH AT BEDTIME 03/11/22   Sabino Dick, DO      Allergies    Ace inhibitors, Angiotensin receptor blockers, and Hctz [hydrochlorothiazide]    Review of Systems   Review of Systems  Gastrointestinal:  Positive for abdominal pain, diarrhea and vomiting.    Physical Exam Updated Vital Signs BP (!) 147/91   Pulse 73   Temp 98.2 F (36.8 C)   Resp 20   LMP 07/07/2022 (Within Days) Comment: Pt states she is due to start in a day or so. Already feeling cramping.  SpO2 95%  Physical Exam Vitals and nursing note reviewed. Exam conducted with a chaperone present.  Constitutional:      Appearance: Normal appearance. She is obese.  HENT:     Head: Normocephalic and atraumatic.  Eyes:     General: No scleral icterus.       Right eye: No discharge.        Left eye: No discharge.     Extraocular Movements: Extraocular movements intact.     Pupils: Pupils are equal, round, and reactive to light.  Cardiovascular:     Rate and Rhythm: Normal rate and regular rhythm.     Pulses: Normal pulses.     Heart sounds: Normal heart sounds. No murmur heard.    No friction rub. No gallop.  Pulmonary:      Effort: Pulmonary effort is normal. No respiratory distress.     Breath sounds: Normal breath sounds.  Abdominal:     General: Abdomen is flat. Bowel sounds are normal. There is no distension.     Palpations: Abdomen is soft.     Tenderness: There is abdominal tenderness in the right upper quadrant. There is no guarding or rebound. Positive signs include Murphy's sign.  Skin:    General: Skin is warm and dry.     Coloration: Skin is not jaundiced.  Neurological:     Mental Status: She is alert. Mental status is at baseline.     Coordination: Coordination normal.     ED Results / Procedures / Treatments   Labs (all labs ordered are listed, but only abnormal results are displayed) Labs Reviewed  COMPREHENSIVE METABOLIC PANEL - Abnormal; Notable for the following components:      Result Value   Glucose, Bld 101 (*)    Total Protein 9.5 (*)    All other components within normal limits  LIPASE, BLOOD  CBC WITH DIFFERENTIAL/PLATELET  URINALYSIS, ROUTINE W REFLEX MICROSCOPIC  HCG, SERUM, QUALITATIVE  TROPONIN I (HIGH SENSITIVITY)    EKG EKG Interpretation  Date/Time:  Thursday August 13 2022 14:22:26 EST Ventricular Rate:  96 PR Interval:  145 QRS Duration: 93 QT Interval:  360 QTC Calculation: 455 R Axis:   11 Text Interpretation: Sinus rhythm Confirmed by Vonita Moss [phone]) on 08/13/2022 3:07:45 PM  Radiology US Abdomen Limited RUQ (LIVER/GB)  Result Date: 08/13/2022 CLINICAL DATA:  46 year old female with acute RIGHT UPPER quadrant abdominal pain. EXAM: ULTRASOUND ABDOMEN LIMITED RIGHT UPPER QUADRANT COMPARISON:  08/11/2022 ultrasound and 08/07/2022 CT FINDINGS: Gallbladder: A 5 mm adherent gallstone is noted. There is no evidence of gallbladder wall thickening, pericholecystic fluid or definite sonographic Murphy sign. Common bile duct: Diameter: 2.5 mm. There is no evidence of intrahepatic or extrahepatic biliary dilatation. Liver: No focal lesion identified.  Within normal limits in parenchymal echogenicity. Portal vein is patent on color Doppler imaging with normal direction of blood flow towards the liver. Other: None. IMPRESSION: 1. Cholelithiasis without evidence of acute cholecystitis. 2. Unremarkable liver. No biliary dilatation. Electronically Signed   By: Harmon Pier M.D.   On: 08/13/2022 15:18    Procedures Procedures    Medications Ordered in ED Medications  lactated ringers bolus 1,000 mL (0 mLs Intravenous Stopped 08/13/22 1615)  ondansetron (ZOFRAN) injection 4 mg (4 mg Intravenous Given 08/13/22 1432)  oxyCODONE (Oxy IR/ROXICODONE) immediate release tablet 5 mg (5 mg Oral Given 08/13/22 1529)  fentaNYL (SUBLIMAZE) injection 50 mcg (50 mcg Intravenous Given 08/13/22 1601)    ED Course/ Medical Decision Making/ A&P                           Medical Decision Making Amount and/or  Complexity of Data Reviewed Labs: ordered. Radiology: ordered.  Risk Prescription drug management.   46 year old presenting today with upper abdominal pain, nausea vomiting diarrhea.  Differential includes but limited to cholecystitis, cholelithiasis, gastritis, sepsis, metabolic abnormality, AKI, dehydration, atypical ACS, pancreatitis  Patient's abdominal exam is nonperitoneal.  Vitals are stable, afebrile no SIRS criteria. -BP (!) 147/91   Pulse 73   Temp 98.2 F (36.8 C) (Oral)   Resp 20   LMP 07/07/2022 (Within Days) Comment: Pt states she is due to start in a day or so. Already feeling cramping.  SpO2 95%   I reviewed external medical records and past ED visits.  -UA from 08/11/2022 was negative for underlying infection, given no urinary symptoms or lower abdominal symptoms I do not think we need to repeat today. U preg negative. -RUQ [place] 08/11/22 with cholelithiasis but no cholecystitis.   CT abdomen pelvis w/ contrast 08/07/22 negative for acute process, gallstones noted.  -NM gastric emptying 08/10/22 negative.   Ordered, viewed and  interpreted laboratory workup. CBC without leukocytosis or anemia. CMP without gross electrolyte derangement, AKI or transaminitis. Troponin undetectable at 4.  Low troponin, no ischemic change on EKG not consistent with atypical ACS. Lipase within normal limits, not consistent pancreatitis.  Considered repeating CT scan but given ultrasound today shows again cholelithiasis without signs of underlying cholecystitis I do not think that is indicated.  Patient is not septic, abdomen is non-peritoneal.  She is tolerating p.o. after fluids, Zofran and pain is improved with the fentanyl and oxycodone.  I think this is symptomatic cholelithiasis.  There is no transaminitis, leukocytosis, fever, or imaging signs of be suggestive of acute cholecystitis, choledocholithiasis or intra-abdominal emergency that would require surgical intervention emergently.    Discussed with patient the importance of following up with general surgery for definitive management of the cholelithiasis.  We discussed return precautions, she will continue taking her home medication and following up with GI.  Discussed HPI, physical exam and plan of care for this patient with attending Vonita Moss. The attending physician evaluated this patient as part of a shared visit and agrees with plan of care.  Discussed HPI, physical exam and plan of care for this patient with attending Vonita Moss. The attending physician evaluated this patient as part of a shared visit and agrees with plan of care.         Final Clinical Impression(s) / ED Diagnoses Final diagnoses:  Calculus of gallbladder without cholecystitis without obstruction    Rx / DC Orders ED Discharge Orders     None         Theron Arista, Cordelia Poche 08/13/22 1633    Rondel Baton, MD 08/13/22 1905

## 2022-08-13 NOTE — Discharge Instructions (Addendum)
Recommendation was reassuring.  Continue your home medications.  Follow-up with GI as scheduled, follow-up with general surgery for definitive management of the symptomatic gallstones.  Return to the ED for fevers, new or concerning symptoms.

## 2022-08-13 NOTE — ED Triage Notes (Signed)
Patient here from home reporting ongoing abd pain. Seen for same 2 days ago. N/v/d. Reports gallstones.

## 2022-08-13 NOTE — ED Notes (Signed)
Pt given water for PO challenge 

## 2022-08-15 ENCOUNTER — Other Ambulatory Visit: Payer: Self-pay

## 2022-08-15 ENCOUNTER — Encounter (HOSPITAL_BASED_OUTPATIENT_CLINIC_OR_DEPARTMENT_OTHER): Payer: Self-pay

## 2022-08-15 ENCOUNTER — Emergency Department (HOSPITAL_BASED_OUTPATIENT_CLINIC_OR_DEPARTMENT_OTHER)
Admission: EM | Admit: 2022-08-15 | Discharge: 2022-08-16 | Disposition: A | Discharge status: Home / Self Care | Payer: Medicaid Other | Attending: Emergency Medicine | Admitting: Emergency Medicine

## 2022-08-15 DIAGNOSIS — Z79899 Other long term (current) drug therapy: Secondary | ICD-10-CM | POA: Insufficient documentation

## 2022-08-15 DIAGNOSIS — N182 Chronic kidney disease, stage 2 (mild): Secondary | ICD-10-CM | POA: Diagnosis not present

## 2022-08-15 DIAGNOSIS — R197 Diarrhea, unspecified: Secondary | ICD-10-CM | POA: Insufficient documentation

## 2022-08-15 DIAGNOSIS — I129 Hypertensive chronic kidney disease with stage 1 through stage 4 chronic kidney disease, or unspecified chronic kidney disease: Secondary | ICD-10-CM | POA: Diagnosis not present

## 2022-08-15 DIAGNOSIS — K591 Functional diarrhea: Secondary | ICD-10-CM

## 2022-08-15 LAB — URINALYSIS, ROUTINE W REFLEX MICROSCOPIC
Bilirubin Urine: NEGATIVE
Glucose, UA: NEGATIVE mg/dL
Hgb urine dipstick: NEGATIVE
Ketones, ur: 15 mg/dL — AB
Leukocytes,Ua: NEGATIVE
Nitrite: NEGATIVE
Protein, ur: NEGATIVE mg/dL
Specific Gravity, Urine: 1.005 (ref 1.005–1.030)
pH: 5.5 (ref 5.0–8.0)

## 2022-08-15 LAB — COMPREHENSIVE METABOLIC PANEL
ALT: 13 U/L (ref 0–44)
AST: 11 U/L — ABNORMAL LOW (ref 15–41)
Albumin: 4.3 g/dL (ref 3.5–5.0)
Alkaline Phosphatase: 49 U/L (ref 38–126)
Anion gap: 15 (ref 5–15)
BUN: 5 mg/dL — ABNORMAL LOW (ref 6–20)
CO2: 23 mmol/L (ref 22–32)
Calcium: 9.7 mg/dL (ref 8.9–10.3)
Chloride: 98 mmol/L (ref 98–111)
Creatinine, Ser: 0.8 mg/dL (ref 0.44–1.00)
GFR, Estimated: >60 mL/min (ref >60)
Glucose, Bld: 102 mg/dL — ABNORMAL HIGH (ref 70–99)
Potassium: 3.5 mmol/L (ref 3.5–5.1)
Sodium: 136 mmol/L (ref 135–145)
Total Bilirubin: 0.5 mg/dL (ref 0.3–1.2)
Total Protein: 8.2 g/dL — ABNORMAL HIGH (ref 6.5–8.1)

## 2022-08-15 LAB — CBC
HCT: 38.5 % (ref 36.0–46.0)
Hemoglobin: 12.7 g/dL (ref 12.0–15.0)
MCH: 28.9 pg (ref 26.0–34.0)
MCHC: 33 g/dL (ref 30.0–36.0)
MCV: 87.5 fL (ref 80.0–100.0)
Platelets: 247 10*3/uL (ref 150–400)
RBC: 4.4 MIL/uL (ref 3.87–5.11)
RDW: 13.5 % (ref 11.5–15.5)
WBC: 7.7 10*3/uL (ref 4.0–10.5)
nRBC: 0 % (ref 0.0–0.2)

## 2022-08-15 LAB — LIPASE, BLOOD: Lipase: 16 U/L (ref 11–51)

## 2022-08-15 NOTE — ED Triage Notes (Addendum)
Pt. Presents from home, states she is supposed to have gallbladder sx soon and was seen recently for gallbladder problems. CC at this time is diarrhea, denies NV. Patients states she believes she is dehydrated as well. States she took "a anti diarrhea pill" she was prescribed but does not remember the name.

## 2022-08-16 NOTE — ED Provider Notes (Signed)
[facility] EMERGENCY DEPT Provider Note  CSN: [id number] Arrival date & time: 08/15/22 2126  Chief Complaint(s) Diarrhea  HPI Tamara Church is a 46 y.o. female      Diarrhea Quality:  Semi-solid Severity:  Moderate Number of episodes:  5 Duration:  1 day Timing:  Intermittent Progression:  Unchanged Relieved by:  Nothing Worsened by:  Nothing Associated symptoms: no abdominal pain, no chills, no fever, no myalgias, no URI and no vomiting   Risk factors: no recent antibiotic use, no sick contacts, no suspicious food intake and no travel to endemic areas    She is concerned this might be due to GI bug or her gallbladder.  Past Medical History Past Medical History:  Diagnosis Date   Abdominal pain, epigastric 07/27/2020   Anemia    ANEMIA 09/07/2008   Qualifier: Diagnosis of  By: Earnest Bailey MD, Kim     Bacterial vaginosis 05/08/2020   Calculus of gallbladder without cholecystitis without obstruction 07/05/2021   Constipation 05/18/2020   Early satiety 05/18/2020   Gastritis and gastroduodenitis 07/27/2020   GERD (gastroesophageal reflux disease)    Hypertension    Non-intractable vomiting 05/18/2020   Palpitations 03/22/2020   Prolonged QT interval 10/04/2020   Screening-pulmonary TB 10/08/2021   Sore throat 09/18/2020   Unintentional weight loss 05/18/2020   UTI (urinary tract infection) 04/23/2020   Patient Active Problem List   Diagnosis Date Noted   Viral gastroenteritis 07/03/2022   Right arm pain 10/22/2021   Prediabetes 10/08/2021   Colon cancer screening 07/05/2021   Adjustment disorder with depressed mood 03/15/2020   Seasonal allergies 02/08/2020   Decreased hearing 12/15/2019   Healthcare maintenance 07/14/2019   CKD (chronic kidney disease) stage 2, GFR 60-89 ml/min 03/21/2012   GERD 09/04/2008   OBESITY, NOS 11/18/2006   HYPERTENSION, BENIGN SYSTEMIC 11/18/2006   Home Medication(s) Prior to Admission medications   Medication Sig Start Date  End Date Taking? Authorizing Provider  alum & mag hydroxide-simeth (MAALOX MULTI SYMPTOM MAX ST) 400-400-40 MG/5ML suspension Take 15 mLs by mouth every 6 (six) hours as needed for indigestion. 08/05/22   Glyn Ade, MD  amLODipine (NORVASC) 10 MG tablet Take 1 tablet (10 mg total) by mouth Church. 07/31/22   Fayette Pho, MD  dicyclomine (BENTYL) 20 MG tablet Take 1 tablet (20 mg total) by mouth 2 (two) times Church. 08/04/22   Meredith Pel, NP  esomeprazole (NEXIUM) 40 MG capsule Take 1 capsule (40 mg total) by mouth Church. 07/03/22   Mansouraty, Netty Starring., MD  famotidine (PEPCID) 20 MG tablet Take 1 tablet (20 mg total) by mouth 2 (two) times Church. 08/05/22   Glyn Ade, MD  levonorgestrel (MIRENA) 20 MCG/24HR IUD 1 each by Intrauterine route once.    [provider]  metoCLOPramide (REGLAN) 10 MG tablet Take 1 tablet (10 mg total) by mouth every 6 (six) hours. 08/05/22   Glyn Ade, MD  pantoprazole (PROTONIX) 20 MG tablet Take 2 tablets (40 mg total) by mouth Church. 08/05/22 09/04/22  Glyn Ade, MD  promethazine (PHENERGAN) 25 MG suppository Place 1 suppository (25 mg total) rectally every 6 (six) hours as needed for nausea or vomiting. 08/07/22   Arthor Captain, PA-C  spironolactone (ALDACTONE) 25 MG tablet TAKE 2 TABLETS BY MOUTH AT BEDTIME 03/11/22   Sabino Dick, DO  Allergies Ace inhibitors, Angiotensin receptor blockers, and Hctz [hydrochlorothiazide]  Review of Systems Review of Systems  Constitutional:  Negative for chills and fever.  Gastrointestinal:  Positive for diarrhea. Negative for abdominal pain and vomiting.  Musculoskeletal:  Negative for myalgias.   As noted in HPI  Physical Exam Vital Signs  I have reviewed the triage vital signs BP (!) 161/91   Pulse 77   Temp 98 F (36.7 C) (Oral)    Resp 18   Ht 5\' 2"  (1.575 m)   Wt 98.9 kg   LMP 07/07/2022 (Within Days) Comment: Pt states she is due to start in a day or so. Already feeling cramping.  SpO2 100%   BMI 39.87 kg/m    Physical Exam Vitals reviewed.  Constitutional:      General: She is not in acute distress.    Appearance: She is well-developed. She is obese. She is not diaphoretic.  HENT:     Head: Normocephalic and atraumatic.     Right Ear: External ear normal.     Left Ear: External ear normal.     Nose: Nose normal.  Eyes:     General: No scleral icterus.    Conjunctiva/sclera: Conjunctivae normal.  Neck:     Trachea: Phonation normal.  Cardiovascular:     Rate and Rhythm: Normal rate and regular rhythm.  Pulmonary:     Effort: Pulmonary effort is normal. No respiratory distress.     Breath sounds: No stridor.  Abdominal:     General: There is no distension.     Tenderness: There is no abdominal tenderness. There is no guarding or rebound.  Musculoskeletal:        General: Normal range of motion.     Cervical back: Normal range of motion.  Neurological:     Mental Status: She is alert and oriented to person, place, and time.  Psychiatric:        Behavior: Behavior normal.     ED Results and Treatments Labs (all labs ordered are listed, but only abnormal results are displayed) Labs Reviewed  COMPREHENSIVE METABOLIC PANEL - Abnormal; Notable for the following components:      Result Value   Glucose, Bld 102 (*)    BUN 5 (*)    Total Protein 8.2 (*)    AST 11 (*)    All other components within normal limits  URINALYSIS, ROUTINE W REFLEX MICROSCOPIC - Abnormal; Notable for the following components:   Color, Urine COLORLESS (*)    Ketones, ur 15 (*)    All other components within normal limits  LIPASE, BLOOD  CBC                                                                                                                         EKG  EKG Interpretation  Date/Time:    Ventricular  Rate:    PR Interval:    QRS Duration:   QT Interval:    QTC Calculation:  R Axis:     Text Interpretation:         Radiology No results found.  Medications Ordered in ED Medications - No data to display                                                                                                                                   Procedures Procedures  (including critical care time)  Medical Decision Making / ED Course   Medical Decision Making Amount and/or Complexity of Data Reviewed Labs: ordered. Decision-making details documented in ED Course.    Diarrhea. No recent Abx that would be concerning for C.diff risk. Patient only eating applesauce and water due to gallbladder. This may be a contributing factor. Also possible viral enteritis. Will check labs to rule out electrolyte or metabolic derangements.  Low concern for serious intraabdominal inflammatory/infectious etiology, requiring imaging.  CBC w/o leuokocytosis or anemia CMP without significant electrolyte derangements or renal insufficiency.  No bili obstruction or pancreatitis.  Patient is tolerating p.o. intake.  Diarrhea is likely functional.  Recommended diet adjustments that would not exacerbate her gallbladder but will help with the diarrhea.       Final Clinical Impression(s) / ED Diagnoses Final diagnoses:  Functional diarrhea   The patient appears reasonably screened and/or stabilized for discharge and I doubt any other medical condition or other [facility] requiring further screening, evaluation, or treatment in the ED at this time. I have discussed the findings, Dx and Tx plan with the patient/family who expressed understanding and agree(s) with the plan. Discharge instructions discussed at length. The patient/family was given strict return precautions who verbalized understanding of the instructions. No further questions at time of discharge.  Disposition: Discharge  Condition: Good  ED  Discharge Orders     None        Follow Up: Primary care provider  Call  to schedule an appointment for close follow up           This chart was dictated using voice recognition software.  Despite best efforts to proofread,  errors can occur which can change the documentation meaning.    Nira Conn, MD 08/16/22 0110

## 2022-08-17 ENCOUNTER — Telehealth: Payer: Self-pay | Admitting: Nurse Practitioner

## 2022-08-17 ENCOUNTER — Telehealth: Payer: Self-pay | Admitting: *Deleted

## 2022-08-17 NOTE — Patient Outreach (Signed)
  Care Coordination TOC Note Transition Care Management Follow-up Telephone Call Date of discharge and from where: 08/15/22 from [facility] How have you been since you were released from the hospital? "I am feeling a little better today" Any questions or concerns? Yes, Ms. Knieriem has reached out to GI to make them aware of her recent ED visit and imaging study to determine if this would change her plan of care.  Items Reviewed: Did the pt receive and understand the discharge instructions provided? Yes  Medications obtained and verified? Yes  Other? No  Any new allergies since your discharge? No  Dietary orders reviewed? Yes Do you have support at home? Yes   Home Care and Equipment/Supplies: Were home health services ordered? no If so, what is the name of the agency? N/A  Has the agency set up a time to come to the patient's home? not applicable Were any new equipment or medical supplies ordered?  No What is the name of the medical supply agency? N/A Were you able to get the supplies/equipment? not applicable Do you have any questions related to the use of the equipment or supplies? No  Functional Questionnaire: (I = Independent and D = Dependent) ADLs: I  Bathing/Dressing- I  Meal Prep- I  Eating- I  Maintaining continence- I  Transferring/Ambulation- I  Managing Meds- I  Follow up appointments reviewed:  PCP Hospital f/u appt confirmed?  N/A, ED visit  Patient will call today to scheduled with PCP Specialist Hospital f/u appt confirmed? Yes  Scheduled to see GI on 08/29/22 @ 12. Are transportation arrangements needed? No  If their condition worsens, is the pt aware to call PCP or go to the Emergency Dept.? Yes Was the patient provided with contact information for the PCP's office or ED? No Was to pt encouraged to call back with questions or concerns? Yes   Estanislado Emms RN, BSN [place]  [employer]

## 2022-08-17 NOTE — Telephone Encounter (Signed)
Spoke with pt. Pt stated over thanksgiving she had to go to the ED again because she is unable to eat due to nausea and abdominal pain. Pt reports that it was her understanding during her recent ED visit that her gallbladder is causing her abdominal pain and nausea. Pt thought that the ED said she did not need the EGD. Pt has been taking bentyl for abdominal pain and zofran for nausea and pt reports that these medications help some but she is still unable to eat. Pt wanted doctor to review ED imaging and notes from 11/23.

## 2022-08-17 NOTE — Telephone Encounter (Signed)
Spoke with pt and gave pt Dr. Elesa Hacker message. Pt wants to keep 12/9 EGD appt.

## 2022-08-17 NOTE — Telephone Encounter (Signed)
I am just reviewing this. She has evidence of cholelithiasis which could be a source for discomfort but it may not as well.  I think it is probably best that she gets a referral to surgery, if the emergency department team did not already place a referral.  But I also think that EGD makes sense.  But certainly she has the ability to make her own decision.  I do not have anything else that I can offer otherwise and since it has been a few years since her last endoscopy it makes sense to make sure nothing else is developed.  But again she certainly can have a referral placed to surgery so that they may consider cholecystectomy. Thanks. GM

## 2022-08-17 NOTE — Telephone Encounter (Signed)
Patient is calling states she was in the ED twice last week, was informed that she has an inflamed gallbladder and also a gallstone, wondering how to proceed. Also states that they told her she doesn't need the EGD. Please advise

## 2022-08-18 ENCOUNTER — Encounter (HOSPITAL_COMMUNITY): Payer: Medicaid Other

## 2022-08-18 NOTE — Telephone Encounter (Signed)
Called [facility] to see if they had received a referral for pt and they said they had not. Referral faxed to [facility] at [phone].

## 2022-08-19 ENCOUNTER — Emergency Department (HOSPITAL_COMMUNITY)
Admission: EM | Admit: 2022-08-19 | Discharge: 2022-08-19 | Disposition: A | Discharge status: Home / Self Care | Payer: Medicaid Other | Attending: Emergency Medicine | Admitting: Emergency Medicine

## 2022-08-19 DIAGNOSIS — H9201 Otalgia, right ear: Secondary | ICD-10-CM | POA: Insufficient documentation

## 2022-08-19 DIAGNOSIS — K0889 Other specified disorders of teeth and supporting structures: Secondary | ICD-10-CM | POA: Diagnosis not present

## 2022-08-19 MED ORDER — CHLORHEXIDINE GLUCONATE 0.12 % MT SOLN: 15.0000 mL | Freq: Two times a day (BID) | OROMUCOSAL | 0 refills | Status: DC

## 2022-08-19 MED ORDER — NAPROXEN 500 MG PO TABS: 500.0000 mg | ORAL_TABLET | Freq: Two times a day (BID) | ORAL | 0 refills | Status: AC

## 2022-08-19 MED ORDER — NAPROXEN 250 MG PO TABS
500.0000 mg | ORAL_TABLET | Freq: Once | ORAL | Status: AC
  Administered 2022-08-19: 500 mg via ORAL
  Filled 2022-08-19: qty 2

## 2022-08-19 MED ORDER — AMOXICILLIN-POT CLAVULANATE 875-125 MG PO TABS: 1.0000 | ORAL_TABLET | Freq: Two times a day (BID) | ORAL | 0 refills | Status: DC

## 2022-08-19 MED ORDER — AMOXICILLIN-POT CLAVULANATE 875-125 MG PO TABS
1.0000 | ORAL_TABLET | Freq: Once | ORAL | Status: AC
  Administered 2022-08-19: 1 via ORAL
  Filled 2022-08-19: qty 1

## 2022-08-19 NOTE — Telephone Encounter (Signed)
Patient called states [facility] does not take her insurance she is seeking a call back to discuss further.

## 2022-08-19 NOTE — Discharge Instructions (Signed)
Please be sure to use the resources if you are unable to follow-up with your own dentist to see 1 in the next few days.  Your pain is likely due to inflammation of the gum, possible early infection.  It is important to take all medication as directed, and take it with food.  Return here for concerning changes in your condition.

## 2022-08-19 NOTE — ED Provider Notes (Signed)
[facility] EMERGENCY DEPARTMENT Provider Note   CSN: [id number] Arrival date & time: 08/19/22  0920     History  Chief Complaint  Patient presents with   Otalgia    Tamara Church is a 46 y.o. female.  HPI Patient presents with concern of pain in the lower teeth, right ear.  Pain has been present for a few days, possibly 2 weeks.  She has not seen a dentist.  Initially the pain was in the lower teeth, subsequently developed in the right ear.  No fever, vomiting.  Patient states that she thinks she has a broken tooth in the mandible.  She is generally well, she has a history of hypertension, and is currently being evaluated for elective cholecystectomy.  No current abdominal pain.    Home Medications Prior to Admission medications   Medication Sig Start Date End Date Taking? Authorizing Provider  amoxicillin-clavulanate (AUGMENTIN) 875-125 MG tablet Take 1 tablet by mouth every 12 (twelve) hours. 08/19/22  Yes Gerhard Munch, MD  chlorhexidine (PERIDEX) 0.12 % solution Use as directed 15 mLs in the mouth or throat 2 (two) times daily. 08/19/22  Yes Gerhard Munch, MD  naproxen (NAPROSYN) 500 MG tablet Take 1 tablet (500 mg total) by mouth 2 (two) times daily with a meal for 7 days. 08/19/22 08/26/22 Yes Gerhard Munch, MD  alum & mag hydroxide-simeth (MAALOX MULTI SYMPTOM MAX ST) 400-400-40 MG/5ML suspension Take 15 mLs by mouth every 6 (six) hours as needed for indigestion. 08/05/22   Glyn Ade, MD  amLODipine (NORVASC) 10 MG tablet Take 1 tablet (10 mg total) by mouth daily. 07/31/22   Fayette Pho, MD  dicyclomine (BENTYL) 20 MG tablet Take 1 tablet (20 mg total) by mouth 2 (two) times daily. 08/04/22   Meredith Pel, NP  esomeprazole (NEXIUM) 40 MG capsule Take 1 capsule (40 mg total) by mouth daily. 07/03/22   Mansouraty, Netty Starring., MD  famotidine (PEPCID) 20 MG tablet Take 1 tablet (20 mg total) by mouth 2 (two) times daily. 08/05/22    Glyn Ade, MD  levonorgestrel (MIRENA) 20 MCG/24HR IUD 1 each by Intrauterine route once.    [provider]  metoCLOPramide (REGLAN) 10 MG tablet Take 1 tablet (10 mg total) by mouth every 6 (six) hours. 08/05/22   Glyn Ade, MD  pantoprazole (PROTONIX) 20 MG tablet Take 2 tablets (40 mg total) by mouth daily. 08/05/22 09/04/22  Glyn Ade, MD  promethazine (PHENERGAN) 25 MG suppository Place 1 suppository (25 mg total) rectally every 6 (six) hours as needed for nausea or vomiting. 08/07/22   Harris, Cammy Copa, PA-C  spironolactone (ALDACTONE) 25 MG tablet TAKE 2 TABLETS BY MOUTH AT BEDTIME 03/11/22   Sabino Dick, DO      Allergies    Ace inhibitors, Angiotensin receptor blockers, and Hctz [hydrochlorothiazide]    Review of Systems   Review of Systems  All other systems reviewed and are negative.   Physical Exam Updated Vital Signs BP (!) 144/89 (BP Location: Right Arm)   Pulse 92   Temp 98 F (36.7 C) (Oral)   Resp 17   LMP 07/07/2022 (Within Days) Comment: Pt states she is due to start in a day or so. Already feeling cramping.  SpO2 98%  Physical Exam Vitals and nursing note reviewed.  Constitutional:      General: She is not in acute distress.    Appearance: She is well-developed.  HENT:     Head: Normocephalic and atraumatic.  Right Ear: Tympanic membrane normal.     Left Ear: Tympanic membrane normal.     Mouth/Throat:   Eyes:     Conjunctiva/sclera: Conjunctivae normal.  Pulmonary:     Effort: Pulmonary effort is normal. No respiratory distress.     Breath sounds: No stridor.  Abdominal:     General: There is no distension.  Skin:    General: Skin is warm and dry.  Neurological:     Mental Status: She is alert and oriented to person, place, and time.     Cranial Nerves: No cranial nerve deficit.  Psychiatric:        Mood and Affect: Mood normal.     ED Results / Procedures / Treatments   Labs (all labs ordered are  listed, but only abnormal results are displayed) Labs Reviewed - No data to display  EKG None  Radiology No results found.  Procedures Procedures    Medications Ordered in ED Medications  amoxicillin-clavulanate (AUGMENTIN) 875-125 MG per tablet 1 tablet (has no administration in time range)  naproxen (NAPROSYN) tablet 500 mg (has no administration in time range)    ED Course/ Medical Decision Making/ A&P                           Medical Decision Making Patient with history of hypertension, ongoing eval for anticipated cholecystectomy presents with mouth pain, ear pain.  Patient's airway is patent, there is no fever, no evidence for bacteremia, sepsis.  Some suspicion for gum erosion, early infection contributing to her symptoms as her ear exam is also benign.  Patient started on antibiotics, anti-inflammatories, follow-up with dentist.  Risk OTC drugs. Prescription drug management.  Final Clinical Impression(s) / ED Diagnoses Final diagnoses:  Pain, dental  Right ear pain    Rx / DC Orders ED Discharge Orders          Ordered    naproxen (NAPROSYN) 500 MG tablet  2 times daily with meals        08/19/22 0936    amoxicillin-clavulanate (AUGMENTIN) 875-125 MG tablet  Every 12 hours        08/19/22 0936    chlorhexidine (PERIDEX) 0.12 % solution  2 times daily        08/19/22 0936              Gerhard Munch, MD 08/19/22 [phone]

## 2022-08-20 NOTE — Telephone Encounter (Signed)
Spoke with pt and pt stated she received a call stating that [facility] did not take her insurance. Called and spoke with [facility] to verify and Tresa Endo stated they do not take pt's insurance.

## 2022-08-20 NOTE — Telephone Encounter (Signed)
Not ideal. She will need to choose between [facility], and we will place a referral then. Thanks. GM

## 2022-08-21 NOTE — Telephone Encounter (Signed)
Inbound call from patient wanting a f/u . Advised patient of recommendations. Patient states  which ever location is best to place the referral. Please adviser.  Thank you

## 2022-08-21 NOTE — Telephone Encounter (Signed)
Referral faxed to [facility] at [phone]. Called pt to let her know. Pt verbalized understanding.

## 2022-08-28 ENCOUNTER — Other Ambulatory Visit: Payer: Medicaid Other

## 2022-08-28 NOTE — Patient Outreach (Signed)
BSW completed a telephone follow up with patient. She stated she did receive the utility resources sent. No other resources are needed at this time.   Gus Puma, BSW, [facility]  [place]  High Risk Managed Medicaid Team  [phone]

## 2022-08-29 ENCOUNTER — Ambulatory Visit (AMBULATORY_SURGERY_CENTER): Payer: Commercial Managed Care - HMO | Admitting: Gastroenterology

## 2022-08-29 ENCOUNTER — Encounter: Payer: Self-pay | Admitting: Gastroenterology

## 2022-08-29 VITALS — BP 128/83 | HR 74 | Temp 97.7°F | Resp 20 | Ht 62.0 in | Wt 218.0 lb

## 2022-08-29 DIAGNOSIS — K222 Esophageal obstruction: Secondary | ICD-10-CM

## 2022-08-29 DIAGNOSIS — K319 Disease of stomach and duodenum, unspecified: Secondary | ICD-10-CM | POA: Diagnosis present

## 2022-08-29 DIAGNOSIS — R110 Nausea: Secondary | ICD-10-CM

## 2022-08-29 DIAGNOSIS — K449 Diaphragmatic hernia without obstruction or gangrene: Secondary | ICD-10-CM | POA: Diagnosis not present

## 2022-08-29 MED ORDER — SODIUM CHLORIDE 0.9 % IV SOLN: 500.0000 mL | Freq: Once | INTRAVENOUS | Status: DC

## 2022-08-29 MED ORDER — SUCRALFATE 1 G PO TABS: 1.0000 g | ORAL_TABLET | Freq: Two times a day (BID) | ORAL | 2 refills | Status: DC

## 2022-08-29 MED ORDER — RABEPRAZOLE SODIUM 20 MG PO TBEC: 20.0000 mg | DELAYED_RELEASE_TABLET | Freq: Two times a day (BID) | ORAL | 2 refills | Status: DC

## 2022-08-29 NOTE — Progress Notes (Unsigned)
GASTROENTEROLOGY PROCEDURE H&P NOTE   Primary Care Physician: Fayette Pho, MD  HPI: Tamara Church is a 46 y.o. female who presents for EGD for evaluation of upper abdominal pain and history of cholelithiasis.  Past Medical History:  Diagnosis Date   Abdominal pain, epigastric 07/27/2020   Anemia    ANEMIA 09/07/2008   Qualifier: Diagnosis of  By: Earnest Bailey MD, Kim     Bacterial vaginosis 05/08/2020   Calculus of gallbladder without cholecystitis without obstruction 07/05/2021   Constipation 05/18/2020   Early satiety 05/18/2020   Gastritis and gastroduodenitis 07/27/2020   GERD (gastroesophageal reflux disease)    Hypertension    Non-intractable vomiting 05/18/2020   Palpitations 03/22/2020   Prolonged QT interval 10/04/2020   Screening-pulmonary TB 10/08/2021   Sore throat 09/18/2020   Unintentional weight loss 05/18/2020   UTI (urinary tract infection) 04/23/2020   Past Surgical History:  Procedure Laterality Date   NO PAST SURGERIES     UPPER GASTROINTESTINAL ENDOSCOPY     Current Outpatient Medications  Medication Sig Dispense Refill   amLODipine (NORVASC) 10 MG tablet Take 1 tablet (10 mg total) by mouth daily. 90 tablet 3   alum & mag hydroxide-simeth (MAALOX MULTI SYMPTOM MAX ST) 400-400-40 MG/5ML suspension Take 15 mLs by mouth every 6 (six) hours as needed for indigestion. 355 mL 0   chlorhexidine (PERIDEX) 0.12 % solution Use as directed 15 mLs in the mouth or throat 2 (two) times daily. 120 mL 0   dicyclomine (BENTYL) 20 MG tablet Take 1 tablet (20 mg total) by mouth 2 (two) times daily. 20 tablet 0   esomeprazole (NEXIUM) 40 MG capsule Take 1 capsule (40 mg total) by mouth daily. 30 capsule 0   famotidine (PEPCID) 20 MG tablet Take 1 tablet (20 mg total) by mouth 2 (two) times daily. 30 tablet 0   levonorgestrel (MIRENA) 20 MCG/24HR IUD 1 each by Intrauterine route once.     metoCLOPramide (REGLAN) 10 MG tablet Take 1 tablet (10 mg total) by mouth every 6 (six)  hours. 30 tablet 0   pantoprazole (PROTONIX) 20 MG tablet Take 2 tablets (40 mg total) by mouth daily. 60 tablet 0   promethazine (PHENERGAN) 25 MG suppository Place 1 suppository (25 mg total) rectally every 6 (six) hours as needed for nausea or vomiting. 12 each 0   spironolactone (ALDACTONE) 25 MG tablet TAKE 2 TABLETS BY MOUTH AT BEDTIME 180 tablet 0   Current Facility-Administered Medications  Medication Dose Route Frequency Provider Last Rate Last Admin   0.9 %  sodium chloride infusion  500 mL Intravenous Once Mansouraty, Netty Starring., MD        Current Outpatient Medications:    amLODipine (NORVASC) 10 MG tablet, Take 1 tablet (10 mg total) by mouth daily., Disp: 90 tablet, Rfl: 3   alum & mag hydroxide-simeth (MAALOX MULTI SYMPTOM MAX ST) 400-400-40 MG/5ML suspension, Take 15 mLs by mouth every 6 (six) hours as needed for indigestion., Disp: 355 mL, Rfl: 0   chlorhexidine (PERIDEX) 0.12 % solution, Use as directed 15 mLs in the mouth or throat 2 (two) times daily., Disp: 120 mL, Rfl: 0   dicyclomine (BENTYL) 20 MG tablet, Take 1 tablet (20 mg total) by mouth 2 (two) times daily., Disp: 20 tablet, Rfl: 0   esomeprazole (NEXIUM) 40 MG capsule, Take 1 capsule (40 mg total) by mouth daily., Disp: 30 capsule, Rfl: 0   famotidine (PEPCID) 20 MG tablet, Take 1 tablet (20 mg total) by  mouth 2 (two) times daily., Disp: 30 tablet, Rfl: 0   levonorgestrel (MIRENA) 20 MCG/24HR IUD, 1 each by Intrauterine route once., Disp: , Rfl:    metoCLOPramide (REGLAN) 10 MG tablet, Take 1 tablet (10 mg total) by mouth every 6 (six) hours., Disp: 30 tablet, Rfl: 0   pantoprazole (PROTONIX) 20 MG tablet, Take 2 tablets (40 mg total) by mouth daily., Disp: 60 tablet, Rfl: 0   promethazine (PHENERGAN) 25 MG suppository, Place 1 suppository (25 mg total) rectally every 6 (six) hours as needed for nausea or vomiting., Disp: 12 each, Rfl: 0   spironolactone (ALDACTONE) 25 MG tablet, TAKE 2 TABLETS BY MOUTH AT BEDTIME,  Disp: 180 tablet, Rfl: 0  Current Facility-Administered Medications:    0.9 %  sodium chloride infusion, 500 mL, Intravenous, Once, Mansouraty, Netty Starring., MD Allergies  Allergen Reactions   Ace Inhibitors Cough   Angiotensin Receptor Blockers     cough   Hctz [Hydrochlorothiazide]     hypokalemia   Family History  Problem Relation Age of Onset   Hypertension Mother    Colon cancer Neg Hx    Esophageal cancer Neg Hx    Rectal cancer Neg Hx    Stomach cancer Neg Hx    Inflammatory bowel disease Neg Hx    Liver disease Neg Hx    Pancreatic cancer Neg Hx    Social History   Socioeconomic History   Marital status: Single    Spouse name: Not on file   Number of children: Not on file   Years of education: Not on file   Highest education level: Not on file  Occupational History   Not on file  Tobacco Use   Smoking status: Never   Smokeless tobacco: Never  Vaping Use   Vaping Use: Never used  Substance and Sexual Activity   Alcohol use: No   Drug use: No   Sexual activity: Yes    Birth control/protection: I.U.D.    Comment: pt would like to start depo provera today  Other Topics Concern   Not on file  Social History Narrative   Not on file   Social Determinants of Health   Financial Resource Strain: Not on file  Food Insecurity: No Food Insecurity (08/10/2022)   Hunger Vital Sign    Worried About Running Out of Food in the Last Year: Never true    Ran Out of Food in the Last Year: Never true  Transportation Needs: No Transportation Needs (08/17/2022)   PRAPARE - Administrator, Civil Service (Medical): No    Lack of Transportation (Non-Medical): No  Physical Activity: Not on file  Stress: Not on file  Social Connections: Not on file  Intimate Partner Violence: Not on file    Physical Exam: Today's Vitals   08/29/22 1104  BP: (!) 136/92  Pulse: (!) 101  Temp: 97.7 F (36.5 C)  TempSrc: Temporal  SpO2: 98%  Weight: 218 lb (98.9 kg)   Height: 5\' 2"  (1.575 m)   Body mass index is 39.87 kg/m. GEN: NAD EYE: Sclerae anicteric ENT: MMM CV: Non-tachycardic GI: Soft, NT/ND NEURO:  Alert & Oriented x 3  Lab Results: No results for input(s): "WBC", "HGB", "HCT", "PLT" in the last 72 hours. BMET No results for input(s): "NA", "K", "CL", "CO2", "GLUCOSE", "BUN", "CREATININE", "CALCIUM" in the last 72 hours. LFT No results for input(s): "PROT", "ALBUMIN", "AST", "ALT", "ALKPHOS", "BILITOT", "BILIDIR", "IBILI" in the last 72 hours. PT/INR No results for input(s): "  LABPROT", "INR" in the last 72 hours.   Impression / Plan: This is a 45 y.o.female who presents for EGD for evaluation of upper abdominal pain and history of cholelithiasis.  The risks and benefits of endoscopic evaluation/treatment were discussed with the patient and/or family; these include but are not limited to the risk of perforation, infection, bleeding, missed lesions, lack of diagnosis, severe illness requiring hospitalization, as well as anesthesia and sedation related illnesses.  The patient's history has been reviewed, patient examined, no change in status, and deemed stable for procedure.  The patient and/or family is agreeable to proceed.    [name], MD [facility] Advanced [facility]oscopy Office # [phone]

## 2022-08-29 NOTE — Progress Notes (Unsigned)
Pt's states no medical or surgical changes since previsit or office visit. 

## 2022-08-29 NOTE — Progress Notes (Unsigned)
Called to room to assist during endoscopic procedure.  Patient ID and intended procedure confirmed with present staff. Received instructions for my participation in the procedure from the performing physician.  

## 2022-08-29 NOTE — Op Note (Signed)
[facility] Patient Name: Tamara Church Procedure Date: 08/29/2022 12:11 PM MRN: [id number] Endoscopist: Justice Britain , MD, [id number] Age: 46 Referring MD:  Date of Birth: December 21, 1975 Gender: Female Account #: [id number] Procedure:                Upper GI endoscopy Indications:              Epigastric abdominal pain, Abdominal pain in the                            right upper quadrant, Dyspepsia, Nausea with                            vomiting Medicines:                Monitored Anesthesia Care Procedure:                Pre-Anesthesia Assessment:                           - Prior to the procedure, a History and Physical                            was performed, and patient medications and                            allergies were reviewed. The patient's tolerance of                            previous anesthesia was also reviewed. The risks                            and benefits of the procedure and the sedation                            options and risks were discussed with the patient.                            All questions were answered, and informed consent                            was obtained. Prior Anticoagulants: The patient has                            taken no anticoagulant or antiplatelet agents. ASA                            Grade Assessment: II - A patient with mild systemic                            disease. After reviewing the risks and benefits,                            the patient was deemed in satisfactory condition to  undergo the procedure.                           After obtaining informed consent, the endoscope was                            passed under direct vision. Throughout the                            procedure, the patient's blood pressure, pulse, and                            oxygen saturations were monitored continuously. The                            GIF [id number] #[id number] was introduced through  the                            mouth, and advanced to the second part of duodenum.                            The upper GI endoscopy was accomplished without                            difficulty. The patient tolerated the procedure. Scope In: Scope Out: Findings:                 No gross lesions were noted in the entire esophagus.                           A widely patent Schatzki ring was found at the                            gastroesophageal junction.                           The Z-line was regular and was found 31 cm from the                            incisors.                           A 4 cm hiatal hernia was present.                           Segmental mild mucosal changes characterized by                            congestion, erythema and altered texture were found                            in the gastric antrum and in the prepyloric region                            of the stomach.  No other gross lesions were noted in the entire                            examined stomach. Biopsies were taken with a cold                            forceps for histology and Helicobacter pylori                            testing.                           No gross lesions were noted in the duodenal bulb,                            in the first portion of the duodenum and in the                            second portion of the duodenum. Biopsies were taken                            with a cold forceps for histology. Complications:            No immediate complications. Estimated Blood Loss:     Estimated blood loss was minimal. Impression:               - No gross lesions in the entire esophagus. Widely                            patent Schatzki ring. Z-line regular, 31 cm from                            the incisors.                           - 4 cm hiatal hernia.                           - Congested, erythematous and texture changed                             mucosa in the antrum and prepyloric region of the                            stomach. No other gross lesions in the entire                            stomach. Biopsied.                           - No gross lesions in the duodenal bulb, in the                            first portion of the duodenum and in the second  portion of the duodenum. Biopsied. Recommendation:           - The patient will be observed post-procedure,                            until all discharge criteria are met.                           - Discharge patient to home.                           - Patient has a contact number available for                            emergencies. The signs and symptoms of potential                            delayed complications were discussed with the                            patient. Return to normal activities tomorrow.                            Written discharge instructions were provided to the                            patient.                           - Resume previous diet and high fiber diet.                           -Transition Protonix to Aciphex twice daily, in                            case this patient is a metabolizer of PPIs.                           - Start Carafate 1 g twice daily.                           - Continue present medications otherwise.                           - Follow-up pathology.                           - Agree with evaluation for consideration of                            gallbladder resection/cholecystectomy, in case this                            is causing her symptoms. It is not clear to me that  her hiatal hernia per se is contributing to her                            symptoms but she does have a moderate-sized hiatal                            hernia and if gallbladder resection is to be                            performed, query hiatal hernia being repaired as                             well. [facility]                            already (unfortunately she was not able to see [facility]                            surgery and GI so due to her insurance).                           - The findings and recommendations were discussed                            with the patient.                           - The findings and recommendations were discussed                            with the patient's family. Justice Britain, MD 08/29/2022 12:32:30 PM

## 2022-08-29 NOTE — Patient Instructions (Signed)
Pick up prescriptions.     YOU HAD AN ENDOSCOPIC PROCEDURE TODAY AT THE [facility]:   Refer to the procedure report that was given to you for any specific questions about what was found during the examination.  If the procedure report does not answer your questions, please call your gastroenterologist to clarify.  If you requested that your care partner not be given the details of your procedure findings, then the procedure report has been included in a sealed envelope for you to review at your convenience later.  YOU SHOULD EXPECT: Some feelings of bloating in the abdomen. Passage of more gas than usual.  Walking can help get rid of the air that was put into your GI tract during the procedure and reduce the bloating. If you had a lower endoscopy (such as a colonoscopy or flexible sigmoidoscopy) you may notice spotting of blood in your stool or on the toilet paper. If you underwent a bowel prep for your procedure, you may not have a normal bowel movement for a few days.  Please Note:  You might notice some irritation and congestion in your nose or some drainage.  This is from the oxygen used during your procedure.  There is no need for concern and it should clear up in a day or so.  SYMPTOMS TO REPORT IMMEDIATELY:  Following upper endoscopy (EGD)  Vomiting of blood or coffee ground material  New chest pain or pain under the shoulder blades  Painful or persistently difficult swallowing  New shortness of breath  Fever of 100F or higher  Black, tarry-looking stools  For urgent or emergent issues, a gastroenterologist can be reached at any hour by calling [phone]. Do not use MyChart messaging for urgent concerns.    DIET:  We do recommend a small meal at first, but then you may proceed to your regular diet.  Drink plenty of fluids but you should avoid alcoholic beverages for 24 hours.  ACTIVITY:  You should plan to take it easy for the rest of today and you should NOT  DRIVE or use heavy machinery until tomorrow (because of the sedation medicines used during the test).    FOLLOW UP: Our staff will call the number listed on your records the next business day following your procedure.  We will call around 7:15- 8:00 am to check on you and address any questions or concerns that you may have regarding the information given to you following your procedure. If we do not reach you, we will leave a message.     If any biopsies were taken you will be contacted by phone or by letter within the next 1-3 weeks.  Please call us at [phone] if you have not heard about the biopsies in 3 weeks.    SIGNATURES/CONFIDENTIALITY: You and/or your care partner have signed paperwork which will be entered into your electronic medical record.  These signatures attest to the fact that that the information above on your After Visit Summary has been reviewed and is understood.  Full responsibility of the confidentiality of this discharge information lies with you and/or your care-partner.

## 2022-08-29 NOTE — Progress Notes (Unsigned)
Sedate, gd SR, tolerated procedure well, VSS, report to RN 

## 2022-08-31 ENCOUNTER — Telehealth: Payer: Self-pay

## 2022-08-31 NOTE — Telephone Encounter (Signed)
  Follow up Call-     08/29/2022   11:08 AM 05/01/2020    9:14 AM  Call back number  Post procedure Call Back phone  # [phone]  Permission to leave phone message Yes Yes     Patient questions:  Do you have a fever, pain , or abdominal swelling? No. Pain Score  0 *  Have you tolerated food without any problems? Yes.    Have you been able to return to your normal activities? Yes.    Do you have any questions about your discharge instructions: Diet   No. Medications  No. Follow up visit  No.  Do you have questions or concerns about your Care? No.  Actions: * If pain score is 4 or above: No action needed, pain <4.

## 2022-09-01 ENCOUNTER — Ambulatory Visit: Payer: Medicaid Other | Admitting: Gastroenterology

## 2022-09-03 ENCOUNTER — Encounter: Payer: Self-pay | Admitting: Gastroenterology

## 2022-09-12 ENCOUNTER — Emergency Department (HOSPITAL_COMMUNITY)
Admission: EM | Admit: 2022-09-12 | Discharge: 2022-09-12 | Discharge status: Against Medical Advice | Payer: Commercial Managed Care - HMO | Attending: Emergency Medicine | Admitting: Emergency Medicine

## 2022-09-12 ENCOUNTER — Encounter (HOSPITAL_COMMUNITY): Payer: Self-pay | Admitting: Emergency Medicine

## 2022-09-12 ENCOUNTER — Emergency Department (HOSPITAL_COMMUNITY): Payer: Commercial Managed Care - HMO

## 2022-09-12 ENCOUNTER — Other Ambulatory Visit: Payer: Self-pay

## 2022-09-12 DIAGNOSIS — Z5321 Procedure and treatment not carried out due to patient leaving prior to being seen by health care provider: Secondary | ICD-10-CM | POA: Insufficient documentation

## 2022-09-12 DIAGNOSIS — Z1152 Encounter for screening for COVID-19: Secondary | ICD-10-CM | POA: Diagnosis not present

## 2022-09-12 DIAGNOSIS — Z8616 Personal history of COVID-19: Secondary | ICD-10-CM | POA: Diagnosis not present

## 2022-09-12 DIAGNOSIS — R0602 Shortness of breath: Secondary | ICD-10-CM | POA: Diagnosis present

## 2022-09-12 DIAGNOSIS — M791 Myalgia, unspecified site: Secondary | ICD-10-CM | POA: Insufficient documentation

## 2022-09-12 DIAGNOSIS — R110 Nausea: Secondary | ICD-10-CM | POA: Diagnosis not present

## 2022-09-12 DIAGNOSIS — R197 Diarrhea, unspecified: Secondary | ICD-10-CM | POA: Insufficient documentation

## 2022-09-12 LAB — COMPREHENSIVE METABOLIC PANEL
ALT: 30 U/L (ref 0–44)
AST: 23 U/L (ref 15–41)
Albumin: 4.5 g/dL (ref 3.5–5.0)
Alkaline Phosphatase: 52 U/L (ref 38–126)
Anion gap: 11 (ref 5–15)
BUN: 7 mg/dL (ref 6–20)
CO2: 24 mmol/L (ref 22–32)
Calcium: 10 mg/dL (ref 8.9–10.3)
Chloride: 102 mmol/L (ref 98–111)
Creatinine, Ser: 0.76 mg/dL (ref 0.44–1.00)
GFR, Estimated: >60 mL/min (ref >60)
Glucose, Bld: 128 mg/dL — ABNORMAL HIGH (ref 70–99)
Potassium: 3.4 mmol/L — ABNORMAL LOW (ref 3.5–5.1)
Sodium: 137 mmol/L (ref 135–145)
Total Bilirubin: 1 mg/dL (ref 0.3–1.2)
Total Protein: 8.7 g/dL — ABNORMAL HIGH (ref 6.5–8.1)

## 2022-09-12 LAB — CBC WITH DIFFERENTIAL/PLATELET
Abs Immature Granulocytes: 0.02 10*3/uL (ref 0.00–0.07)
Basophils Absolute: 0 10*3/uL (ref 0.0–0.1)
Basophils Relative: 0 %
Eosinophils Absolute: 0 10*3/uL (ref 0.0–0.5)
Eosinophils Relative: 0 %
HCT: 39.5 % (ref 36.0–46.0)
Hemoglobin: 13.3 g/dL (ref 12.0–15.0)
Immature Granulocytes: 0 %
Lymphocytes Relative: 3 %
Lymphs Abs: 0.2 10*3/uL — ABNORMAL LOW (ref 0.7–4.0)
MCH: 29.5 pg (ref 26.0–34.0)
MCHC: 33.7 g/dL (ref 30.0–36.0)
MCV: 87.6 fL (ref 80.0–100.0)
Monocytes Absolute: 0.5 10*3/uL (ref 0.1–1.0)
Monocytes Relative: 6 %
Neutro Abs: 8.5 10*3/uL — ABNORMAL HIGH (ref 1.7–7.7)
Neutrophils Relative %: 91 %
Platelets: 229 10*3/uL (ref 150–400)
RBC: 4.51 MIL/uL (ref 3.87–5.11)
RDW: 13.9 % (ref 11.5–15.5)
WBC: 9.3 10*3/uL (ref 4.0–10.5)
nRBC: 0 % (ref 0.0–0.2)

## 2022-09-12 LAB — TROPONIN I (HIGH SENSITIVITY)
Troponin I (High Sensitivity): 3 ng/L (ref <18)
Troponin I (High Sensitivity): 5 ng/L (ref <18)

## 2022-09-12 LAB — I-STAT BETA HCG BLOOD, ED (MC, WL, AP ONLY): I-stat hCG, quantitative: <5 m[IU]/mL (ref <5)

## 2022-09-12 LAB — RESP PANEL BY RT-PCR (RSV, FLU A&B, COVID)  RVPGX2
Influenza A by PCR: NEGATIVE
Influenza B by PCR: NEGATIVE
Resp Syncytial Virus by PCR: NEGATIVE
SARS Coronavirus 2 by RT PCR: NEGATIVE

## 2022-09-12 MED ORDER — ONDANSETRON 4 MG PO TBDP
8.0000 mg | ORAL_TABLET | Freq: Once | ORAL | Status: AC
  Administered 2022-09-12: 8 mg via ORAL
  Filled 2022-09-12: qty 2

## 2022-09-12 MED ORDER — IBUPROFEN 400 MG PO TABS
600.0000 mg | ORAL_TABLET | Freq: Once | ORAL | Status: AC
  Administered 2022-09-12: 600 mg via ORAL
  Filled 2022-09-12: qty 1

## 2022-09-12 NOTE — ED Notes (Signed)
X2 for vitals recheck. Sort techs scanned every patient in room for vitals and this patient was not found.

## 2022-09-12 NOTE — ED Provider Triage Note (Signed)
Emergency Medicine Provider Triage Evaluation Note  Tamara Church , a 46 y.o. female  was evaluated in triage.  Pt complains of nausea, diarrhea, diffuse myalgias/arthralgias, shortness of breath.  Patient states that symptoms are abrupt onset this morning.  Reports his son tested positive for COVID yesterday.  Denies fever, cough, congestion, vomiting, hematochezia/melena..  Review of Systems  Positive: See above Negative:   Physical Exam  BP (!) 156/104   Pulse (!) 123   Temp 99.2 F (37.3 C) (Oral)   Resp 20   LMP 08/20/2022 (Exact Date)   SpO2 100%  Gen:   Awake, no distress   Resp:  Normal effort  MSK:   Moves extremities without difficulty  Other:    Medical Decision Making  Medically screening exam initiated at 3:22 PM.  Appropriate orders placed.  [name] was informed that the remainder of the evaluation will be completed by another provider, this initial triage assessment does not replace that evaluation, and the importance of remaining in the ED until their evaluation is complete.     Peter Garter, [place] 09/12/22 1527

## 2022-09-12 NOTE — ED Triage Notes (Signed)
Patient w/ nausea, diarrhea, body aches, headache, shortness of breath at rest and with exertion that started this AM when she woke up. Endorses sore throat. Son who is accompanying her also is feeling sick as well and was tested for COVID yesterday. Aox4

## 2022-09-17 ENCOUNTER — Other Ambulatory Visit: Payer: Self-pay

## 2022-09-17 DIAGNOSIS — I10 Essential (primary) hypertension: Secondary | ICD-10-CM

## 2022-09-17 MED ORDER — SPIRONOLACTONE 25 MG PO TABS: 50.0000 mg | ORAL_TABLET | Freq: Every day | ORAL | 3 refills | Status: DC

## 2022-09-19 ENCOUNTER — Emergency Department (HOSPITAL_COMMUNITY)
Admission: EM | Admit: 2022-09-19 | Discharge: 2022-09-19 | Discharge status: Against Medical Advice | Payer: Commercial Managed Care - HMO | Attending: Emergency Medicine | Admitting: Emergency Medicine

## 2022-09-19 ENCOUNTER — Encounter (HOSPITAL_COMMUNITY): Payer: Self-pay

## 2022-09-19 DIAGNOSIS — R42 Dizziness and giddiness: Secondary | ICD-10-CM | POA: Diagnosis present

## 2022-09-19 DIAGNOSIS — E860 Dehydration: Secondary | ICD-10-CM | POA: Diagnosis not present

## 2022-09-19 DIAGNOSIS — R110 Nausea: Secondary | ICD-10-CM | POA: Insufficient documentation

## 2022-09-19 DIAGNOSIS — R630 Anorexia: Secondary | ICD-10-CM | POA: Diagnosis not present

## 2022-09-19 DIAGNOSIS — Z5321 Procedure and treatment not carried out due to patient leaving prior to being seen by health care provider: Secondary | ICD-10-CM | POA: Insufficient documentation

## 2022-09-19 DIAGNOSIS — R109 Unspecified abdominal pain: Secondary | ICD-10-CM | POA: Insufficient documentation

## 2022-09-19 LAB — CBC WITH DIFFERENTIAL/PLATELET
Abs Immature Granulocytes: 0.01 10*3/uL (ref 0.00–0.07)
Basophils Absolute: 0 10*3/uL (ref 0.0–0.1)
Basophils Relative: 0 %
Eosinophils Absolute: 0 10*3/uL (ref 0.0–0.5)
Eosinophils Relative: 0 %
HCT: 39.1 % (ref 36.0–46.0)
Hemoglobin: 13 g/dL (ref 12.0–15.0)
Immature Granulocytes: 0 %
Lymphocytes Relative: 32 %
Lymphs Abs: 1.5 10*3/uL (ref 0.7–4.0)
MCH: 28.9 pg (ref 26.0–34.0)
MCHC: 33.2 g/dL (ref 30.0–36.0)
MCV: 86.9 fL (ref 80.0–100.0)
Monocytes Absolute: 0.4 10*3/uL (ref 0.1–1.0)
Monocytes Relative: 8 %
Neutro Abs: 2.9 10*3/uL (ref 1.7–7.7)
Neutrophils Relative %: 60 %
Platelets: 261 10*3/uL (ref 150–400)
RBC: 4.5 MIL/uL (ref 3.87–5.11)
RDW: 13.6 % (ref 11.5–15.5)
WBC: 4.8 10*3/uL (ref 4.0–10.5)
nRBC: 0 % (ref 0.0–0.2)

## 2022-09-19 LAB — LIPASE, BLOOD: Lipase: 33 U/L (ref 11–51)

## 2022-09-19 LAB — COMPREHENSIVE METABOLIC PANEL
ALT: 22 U/L (ref 0–44)
AST: 20 U/L (ref 15–41)
Albumin: 4 g/dL (ref 3.5–5.0)
Alkaline Phosphatase: 45 U/L (ref 38–126)
Anion gap: 12 (ref 5–15)
BUN: 6 mg/dL (ref 6–20)
CO2: 23 mmol/L (ref 22–32)
Calcium: 9.7 mg/dL (ref 8.9–10.3)
Chloride: 102 mmol/L (ref 98–111)
Creatinine, Ser: 0.74 mg/dL (ref 0.44–1.00)
GFR, Estimated: >60 mL/min (ref >60)
Glucose, Bld: 108 mg/dL — ABNORMAL HIGH (ref 70–99)
Potassium: 3.2 mmol/L — ABNORMAL LOW (ref 3.5–5.1)
Sodium: 137 mmol/L (ref 135–145)
Total Bilirubin: 0.6 mg/dL (ref 0.3–1.2)
Total Protein: 8.1 g/dL (ref 6.5–8.1)

## 2022-09-19 LAB — HCG, SERUM, QUALITATIVE: Preg, Serum: NEGATIVE

## 2022-09-19 NOTE — ED Provider Triage Note (Cosign Needed)
Emergency Medicine Provider Triage Evaluation Note  Tamara Church , a 46 y.o. female  was evaluated in triage.  Pt complains of nausea, decreased appetite and lightheadedness with standing.  Patient states that she went in for COVID test last week at home but left before she got the result.  The past 3 days she has had "sour stomach" lightheadedness, mild nausea without vomiting or diarrhea.  She thinks she may be constipated..  Review of Systems  Positive: nausea Negative: Vomiting  Physical Exam  BP (!) 135/90 (BP Location: Left Arm)   Pulse 89   Temp 98.8 F (37.1 C) (Oral)   Resp 16   LMP 08/20/2022 (Exact Date)   SpO2 99%  Gen:   Awake, no distress   Resp:  Normal effort  MSK:   Moves extremities without difficulty  Other:  No distension  Medical Decision Making  Medically screening exam initiated at 4:59 PM.  Appropriate orders placed.  [name] was informed that the remainder of the evaluation will be completed by another provider, this initial triage assessment does not replace that evaluation, and the importance of remaining in the ED until their evaluation is complete.     Arthor Captain, PA-C 09/19/22 1701

## 2022-09-19 NOTE — ED Triage Notes (Signed)
Pt presents with c/o abdominal pain and she is concerned that she is dehydrated.

## 2022-09-21 ENCOUNTER — Encounter (HOSPITAL_BASED_OUTPATIENT_CLINIC_OR_DEPARTMENT_OTHER): Payer: Self-pay

## 2022-09-21 ENCOUNTER — Emergency Department (HOSPITAL_BASED_OUTPATIENT_CLINIC_OR_DEPARTMENT_OTHER)
Admission: EM | Admit: 2022-09-21 | Discharge: 2022-09-21 | Disposition: A | Discharge status: Home / Self Care | Payer: Medicaid Other | Attending: Emergency Medicine | Admitting: Emergency Medicine

## 2022-09-21 ENCOUNTER — Other Ambulatory Visit: Payer: Self-pay

## 2022-09-21 DIAGNOSIS — R1084 Generalized abdominal pain: Secondary | ICD-10-CM

## 2022-09-21 DIAGNOSIS — K029 Dental caries, unspecified: Secondary | ICD-10-CM | POA: Diagnosis not present

## 2022-09-21 DIAGNOSIS — N189 Chronic kidney disease, unspecified: Secondary | ICD-10-CM | POA: Diagnosis not present

## 2022-09-21 DIAGNOSIS — K529 Noninfective gastroenteritis and colitis, unspecified: Secondary | ICD-10-CM | POA: Diagnosis not present

## 2022-09-21 DIAGNOSIS — I129 Hypertensive chronic kidney disease with stage 1 through stage 4 chronic kidney disease, or unspecified chronic kidney disease: Secondary | ICD-10-CM | POA: Insufficient documentation

## 2022-09-21 DIAGNOSIS — R1013 Epigastric pain: Secondary | ICD-10-CM | POA: Diagnosis present

## 2022-09-21 LAB — LIPASE, BLOOD: Lipase: 22 U/L (ref 11–51)

## 2022-09-21 LAB — COMPREHENSIVE METABOLIC PANEL
ALT: 15 U/L (ref 0–44)
AST: 14 U/L — ABNORMAL LOW (ref 15–41)
Albumin: 4.6 g/dL (ref 3.5–5.0)
Alkaline Phosphatase: 47 U/L (ref 38–126)
Anion gap: 16 — ABNORMAL HIGH (ref 5–15)
BUN: 9 mg/dL (ref 6–20)
CO2: 24 mmol/L (ref 22–32)
Calcium: 10.1 mg/dL (ref 8.9–10.3)
Chloride: 98 mmol/L (ref 98–111)
Creatinine, Ser: 0.63 mg/dL (ref 0.44–1.00)
GFR, Estimated: >60 mL/min (ref >60)
Glucose, Bld: 121 mg/dL — ABNORMAL HIGH (ref 70–99)
Potassium: 3.1 mmol/L — ABNORMAL LOW (ref 3.5–5.1)
Sodium: 138 mmol/L (ref 135–145)
Total Bilirubin: 0.5 mg/dL (ref 0.3–1.2)
Total Protein: 8.7 g/dL — ABNORMAL HIGH (ref 6.5–8.1)

## 2022-09-21 LAB — CBC
HCT: 39.2 % (ref 36.0–46.0)
Hemoglobin: 13.1 g/dL (ref 12.0–15.0)
MCH: 28.8 pg (ref 26.0–34.0)
MCHC: 33.4 g/dL (ref 30.0–36.0)
MCV: 86.2 fL (ref 80.0–100.0)
Platelets: 285 10*3/uL (ref 150–400)
RBC: 4.55 MIL/uL (ref 3.87–5.11)
RDW: 13.7 % (ref 11.5–15.5)
WBC: 7.4 10*3/uL (ref 4.0–10.5)
nRBC: 0 % (ref 0.0–0.2)

## 2022-09-21 MED ORDER — LIDOCAINE VISCOUS HCL 2 % MT SOLN
15.0000 mL | Freq: Once | OROMUCOSAL | Status: AC
  Administered 2022-09-21: 15 mL via ORAL
  Filled 2022-09-21: qty 15

## 2022-09-21 MED ORDER — AMOXICILLIN-POT CLAVULANATE 875-125 MG PO TABS: 1.0000 | ORAL_TABLET | Freq: Two times a day (BID) | ORAL | 0 refills | Status: DC

## 2022-09-21 MED ORDER — ONDANSETRON HCL 4 MG PO TABS: 4.0000 mg | ORAL_TABLET | Freq: Four times a day (QID) | ORAL | 0 refills | Status: DC

## 2022-09-21 MED ORDER — MORPHINE SULFATE (PF) 2 MG/ML IV SOLN
2.0000 mg | Freq: Once | INTRAVENOUS | Status: DC
  Filled 2022-09-21: qty 1

## 2022-09-21 MED ORDER — SODIUM CHLORIDE 0.9 % IV BOLUS
1000.0000 mL | Freq: Once | INTRAVENOUS | Status: AC
  Administered 2022-09-21: 1000 mL via INTRAVENOUS

## 2022-09-21 MED ORDER — ONDANSETRON HCL 4 MG/2ML IJ SOLN
4.0000 mg | Freq: Once | INTRAMUSCULAR | Status: AC
  Administered 2022-09-21: 4 mg via INTRAVENOUS
  Filled 2022-09-21: qty 2

## 2022-09-21 MED ORDER — POTASSIUM CHLORIDE CRYS ER 20 MEQ PO TBCR
40.0000 meq | EXTENDED_RELEASE_TABLET | Freq: Once | ORAL | Status: AC
  Administered 2022-09-21: 40 meq via ORAL
  Filled 2022-09-21: qty 2

## 2022-09-21 MED ORDER — ALUM & MAG HYDROXIDE-SIMETH 200-200-20 MG/5ML PO SUSP
30.0000 mL | Freq: Once | ORAL | Status: AC
  Administered 2022-09-21: 30 mL via ORAL
  Filled 2022-09-21: qty 30

## 2022-09-21 NOTE — Discharge Instructions (Signed)
You may use some over-the-counter Maalox, Mylanta, recommend that you drink plenty of fluids, such as Pedialyte, Gatorade,You may want to take the nausea medicationAround 30 minutes prior to taking antibiotic and make sure that you have a meal in your stomach to ensure that you tolerate taking this medication it should cover for any dental infection.

## 2022-09-21 NOTE — ED Provider Notes (Signed)
[facility] EMERGENCY DEPT Provider Note   CSN: [id number] Arrival date & time: 09/21/22  0704     History  Chief Complaint  Patient presents with   Abdominal Pain    Tamara Church is a 47 y.o. female with past medical history significant for obesity, acid reflux, CKD, prediabetes who presents with concern for 2 to 3 days of generalized epigastric abdominal pain, with some nausea but no vomiting.  She reports that she is having some burping gas, feeling like she should throughout but is unable to.  Patient also reports that she has some known dental disease with broken teeth in the lower right portion of the jaw, and is worried that since they broke off recently and 1 area and may have become infected.  She has not spoken to her dentist about these teeth.  She denies any dysuria, hematuria, vaginal discharge, diarrhea, constipation.  She denies any recent previous history of intra-abdominal surgery.  She does report that she has some known gallbladder stones.   Abdominal Pain      Home Medications Prior to Admission medications   Medication Sig Start Date End Date Taking? Authorizing Provider  amoxicillin-clavulanate (AUGMENTIN) 875-125 MG tablet Take 1 tablet by mouth every 12 (twelve) hours. 09/21/22  Yes Kaityln Kallstrom H, PA-C  ondansetron (ZOFRAN) 4 MG tablet Take 1 tablet (4 mg total) by mouth every 6 (six) hours. 09/21/22  Yes Marcelo Ickes H, PA-C  alum & mag hydroxide-simeth (MAALOX MULTI SYMPTOM MAX ST) 400-400-40 MG/5ML suspension Take 15 mLs by mouth every 6 (six) hours as needed for indigestion. 08/05/22   Glyn Ade, MD  amLODipine (NORVASC) 10 MG tablet Take 1 tablet (10 mg total) by mouth daily. 07/31/22   Fayette Pho, MD  chlorhexidine (PERIDEX) 0.12 % solution Use as directed 15 mLs in the mouth or throat 2 (two) times daily. 08/19/22   Gerhard Munch, MD  dicyclomine (BENTYL) 20 MG tablet Take 1 tablet (20 mg total) by mouth 2 (two)  times daily. 08/04/22   Meredith Pel, NP  famotidine (PEPCID) 20 MG tablet Take 1 tablet (20 mg total) by mouth 2 (two) times daily. Patient not taking: Reported on 08/29/2022 08/05/22   Glyn Ade, MD  levonorgestrel (MIRENA) 20 MCG/24HR IUD 1 each by Intrauterine route once.    [provider]  metoCLOPramide (REGLAN) 10 MG tablet Take 1 tablet (10 mg total) by mouth every 6 (six) hours. Patient not taking: Reported on 08/29/2022 08/05/22   Glyn Ade, MD  promethazine (PHENERGAN) 25 MG suppository Place 1 suppository (25 mg total) rectally every 6 (six) hours as needed for nausea or vomiting. Patient not taking: Reported on 08/29/2022 08/07/22   Arthor Captain, PA-C  RABEprazole (ACIPHEX) 20 MG tablet Take 1 tablet (20 mg total) by mouth 2 (two) times daily. 08/29/22   Mansouraty, Netty Starring., MD  spironolactone (ALDACTONE) 25 MG tablet Take 2 tablets (50 mg total) by mouth at bedtime. 09/17/22   Fayette Pho, MD  sucralfate (CARAFATE) 1 g tablet Take 1 tablet (1 g total) by mouth 2 (two) times daily. 08/29/22   Mansouraty, Netty Starring., MD      Allergies    Ace inhibitors, Angiotensin receptor blockers, and Hctz [hydrochlorothiazide]    Review of Systems   Review of Systems  Gastrointestinal:  Positive for abdominal pain.  All other systems reviewed and are negative.   Physical Exam Updated Vital Signs BP 124/85 (BP Location: Right Arm)   Pulse (!) 108  Temp 98.1 F (36.7 C) (Oral)   Resp 16   Ht 5\' 1"  (1.549 m)   Wt 81.6 kg   LMP 08/20/2022 (Exact Date)   SpO2 100%   BMI 34.01 kg/m  Physical Exam Vitals and nursing note reviewed.  Constitutional:      General: She is not in acute distress.    Appearance: Normal appearance.  HENT:     Head: Normocephalic and atraumatic.     Comments: Patient does have some broken teeth in the bottom right of the jaw with inflamed gums.  She has no significant floor of mouth swelling, redness, uvula midline,  tonsils 1+, intact swallow without difficulty. Eyes:     General:        Right eye: No discharge.        Left eye: No discharge.  Cardiovascular:     Rate and Rhythm: Normal rate and regular rhythm.     Heart sounds: No murmur heard.    No friction rub. No gallop.  Pulmonary:     Effort: Pulmonary effort is normal.     Breath sounds: Normal breath sounds.  Abdominal:     General: Bowel sounds are normal.     Palpations: Abdomen is soft.     Comments: Patient was of generalized tenderness to palpation throughout the abdomen but without rebound, rigidity, guarding, there is no focal tenderness on my exam, no Murphy sign noted  Skin:    General: Skin is warm and dry.     Capillary Refill: Capillary refill takes less than 2 seconds.  Neurological:     Mental Status: She is alert and oriented to person, place, and time.  Psychiatric:        Mood and Affect: Mood normal.        Behavior: Behavior normal.     ED Results / Procedures / Treatments   Labs (all labs ordered are listed, but only abnormal results are displayed) Labs Reviewed  COMPREHENSIVE METABOLIC PANEL - Abnormal; Notable for the following components:      Result Value   Potassium 3.1 (*)    Glucose, Bld 121 (*)    Total Protein 8.7 (*)    AST 14 (*)    Anion gap 16 (*)    All other components within normal limits  LIPASE, BLOOD  CBC    EKG None  Radiology No results found.  Procedures Procedures    Medications Ordered in ED Medications  morphine (PF) 2 MG/ML injection 2 mg (2 mg Intravenous Not Given 09/21/22 1005)  potassium chloride SA (KLOR-CON M) CR tablet 40 mEq (40 mEq Oral Given 09/21/22 0951)  alum & mag hydroxide-simeth (MAALOX/MYLANTA) 200-200-20 MG/5ML suspension 30 mL (30 mLs Oral Given 09/21/22 0951)    And  lidocaine (XYLOCAINE) 2 % viscous mouth solution 15 mL (15 mLs Oral Given 09/21/22 0951)  sodium chloride 0.9 % bolus 1,000 mL (0 mLs Intravenous Stopped 09/21/22 1112)  ondansetron (ZOFRAN)  injection 4 mg (4 mg Intravenous Given 09/21/22 8242)    ED Course/ Medical Decision Making/ A&P                           Medical Decision Making Amount and/or Complexity of Data Reviewed Labs: ordered.  Risk OTC drugs. Prescription drug management.   This patient is a 47 y.o. female  who presents to the ED for concern of abdominal pain, some generalized pain, nausea, no vomiting, burping gas, as well  as dental pain.   Differential diagnoses prior to evaluation: The emergent differential diagnosis includes, but is not limited to,  esophagitis, gastritis, peptic ulcer disease, esophageal rupture, gastric rupture, Boerhaave's, Mallory-Weiss, pancreatitis, cholecystitis, cholangitis, acute mesenteric ischemia, atypical chest pain or ACS, lower lobar pneumonia versus other .   This is not an exhaustive differential.   Past Medical History / Co-morbidities: Obesity, hypertension, acid reflux, CKD  Additional history: Chart reviewed. Pertinent results include: Reviewed lab work, imaging from recent previous emergency department visits, outpatient GI and surgery visits, patient does have some known gallbladder pathology, however it does not have any focal right upper quadrant tenderness today  Physical Exam: Physical exam performed. The pertinent findings include: Patient has some generalized epigastric tenderness to palpation, she does have a mild tachycardia with normal rhythm, she is not in any respiratory distress, she is afebrile with normal blood pressure, stable oxygen saturation on room air.  She is overall well-appearing, she does have some poor dentition on the right with some redness of the surrounding gum suggestive of a dental infection.  No evidence of drainable abscess, Ludwig angina, PTA, tonsillitis, epiglottitis  Lab Tests/Imaging studies: I personally interpreted labs/imaging and the pertinent results include: CBC unremarkable, no leukocytosis, lipase negative, CMP notable  for very mild hypokalemia, testing 3.1, anion gap is mildly elevated at 16, I think that is likely secondary to mild dehydration as patient with improvement of tachycardia after receiving IV fluids, improvement of overall symptoms, as well as clinical appearance.   Medications: I ordered medication including fluid bolus, Zofran, [name], potassium.  I have reviewed the patients home medicines and have made adjustments as needed.   Disposition: After consideration of the diagnostic results and the patients response to treatment, I feel that patient's symptoms are consistent with a viral gastroenteritis, with overlying dental infection, will treat dental infection with Augmentin, encouraged ibuprofen, Tylenol, encourage Maalox, Mylanta, plenty of fluids, rest for her generalized epigastric pain with suspicion for viral gastroenteritis versus acid reflux type symptoms.Marland Kitchen   emergency department workup does not suggest an emergent condition requiring admission or immediate intervention beyond what has been performed at this time. The plan is: as above. The patient is safe for discharge and has been instructed to return immediately for worsening symptoms, change in symptoms or any other concerns.  Final Clinical Impression(s) / ED Diagnoses Final diagnoses:  Pain due to dental caries  Generalized abdominal pain  Gastroenteritis    Rx / DC Orders ED Discharge Orders          Ordered    amoxicillin-clavulanate (AUGMENTIN) 875-125 MG tablet  Every 12 hours        09/21/22 1107    ondansetron (ZOFRAN) 4 MG tablet  Every 6 hours        09/21/22 1107              Fernande Treiber, [place] H, PA-C 09/21/22 1124    Tegeler, Gwenyth Allegra, MD 09/21/22 1529

## 2022-09-21 NOTE — ED Notes (Signed)
Discharge paperwork given and verbally understood. 

## 2022-09-21 NOTE — ED Triage Notes (Signed)
Onset Saturday of abdominal pain.  Generalize pain  Nausea no vomiting  States having burping gas

## 2022-09-22 ENCOUNTER — Telehealth: Payer: Self-pay | Admitting: Gastroenterology

## 2022-09-22 ENCOUNTER — Telehealth: Payer: Self-pay | Admitting: *Deleted

## 2022-09-22 NOTE — Telephone Encounter (Signed)
Patient was seen in the ER for dental Pain, not GI symptoms. [place] to see if they had sent information over to our office requsting PA for Aciphex. Nothing has been sent as of today. Pharmacy will re-open on 2:30 pm per their recording.

## 2022-09-22 NOTE — Telephone Encounter (Signed)
Med refills go to the CMA thank you I will forward.

## 2022-09-22 NOTE — Telephone Encounter (Signed)
Patient called stating she had to go to the ER because she is completely out of her medication and needs it and [facility] will not give her anymore unless she has a prior authorization from her insurance company.  Please call patient and let her know.  Thank you.

## 2022-09-22 NOTE — Telephone Encounter (Signed)
PA Team,  Patient states that she needs PA for Aciphex. I printed what was placed in Cover-my meds by [place]  and faxed it over to you guys. We never received fax from pharmacy stating that PA was needed.   Patient was informed of below: Return call to patient to let her know that we still have not recd anything from [facility]. I printed PA form from Cover my meds and sent it over to our PA department. Patient was informed that it could take a few days to receive approval or denial, but she will be contacted with that information.

## 2022-09-22 NOTE — Patient Outreach (Signed)
  Care Coordination [facility] Note Transition Care Management Follow-up Telephone Call Date of discharge and from where: 09/19/22, LWBS from [facility] ED, Seen and discharged 09/21/22 from [facility] ED How have you been since you were released from the hospital? "I am doing way better" Any questions or concerns? No  Items Reviewed: Did the pt receive and understand the discharge instructions provided? Yes  Medications obtained and verified? Yes  Other? No  Any new allergies since your discharge? No  Dietary orders reviewed? Yes Do you have support at home? Yes   Home Care and Equipment/Supplies: Were home health services ordered? no If so, what is the name of the agency? N/A  Has the agency set up a time to come to the patient's home? not applicable Were any new equipment or medical supplies ordered?  No What is the name of the medical supply agency? N/A Were you able to get the supplies/equipment? not applicable Do you have any questions related to the use of the equipment or supplies? No  Functional Questionnaire: (I = Independent and D = Dependent) ADLs: I  Bathing/Dressing- I  Meal Prep- I  Eating- I  Maintaining continence- I  Transferring/Ambulation- I  Managing Meds- I  Follow up appointments reviewed:  PCP Hospital f/u appt confirmed?  N/A, ED visit  Planning to schedule with PCP tomorrow. [facility] f/u appt confirmed?  N/A, ED visit  . Are transportation arrangements needed? Yes  If their condition worsens, is the pt aware to call PCP or go to the Emergency Dept.? Yes Was the patient provided with contact information for the PCP's office or ED? No Was to pt encouraged to call back with questions or concerns? Yes  SDOH assessments and interventions completed:   Yes   RNCM provided patient with information for Case Management. Patient agreed to [facility] services with the MM team. An initial visit was scheduled with RNCM on 10/06/22 @ 3:30pm. Patient agreed to  date and time.  Lurena Joiner RN, BSN [place]  [employer] manager

## 2022-09-22 NOTE — Telephone Encounter (Signed)
Tamara Church from [employer] is calling with patient pertaining to approval for Rabeprazole. Please advise.

## 2022-09-23 ENCOUNTER — Other Ambulatory Visit (HOSPITAL_COMMUNITY): Payer: Self-pay

## 2022-09-23 ENCOUNTER — Telehealth: Payer: Self-pay | Admitting: Gastroenterology

## 2022-09-23 ENCOUNTER — Telehealth: Payer: Self-pay | Admitting: Pharmacy Technician

## 2022-09-23 NOTE — Telephone Encounter (Signed)
Tamara Church have you heard anything about this referral? The pt is calling in reference to the appt.     Tamara Potter, RN      08/21/22  3:34 PM Note Referral faxed to [facility] at [phone]. Called pt to let her know. Pt verbalized understanding.

## 2022-09-23 NOTE — Telephone Encounter (Signed)
Called [facility] at [phone] to make sure they received the referral. Pt is scheduled with Dr. Emeterio Reeve on 09/29/22 at 10 am. Office is located on the 5th floor of the [place] tower. Called pt to let her know appointment date and time. Pt verbalized understanding.

## 2022-09-23 NOTE — Telephone Encounter (Signed)
PA has been submitted and telephone encounter has been created 

## 2022-09-23 NOTE — Telephone Encounter (Signed)
Patient Advocate Encounter  Received notification from [employer] that prior authorization for RABEPRAZOLE 20MG  is required.   PA submitted on 1.3.24 Key [id number] Status is pending

## 2022-09-23 NOTE — Telephone Encounter (Signed)
Inbound call from patient stating that she was supposed to be getting a call from [facility] to schedule about getting her gallbladder out and she has not heard anything in the month. Patient is requesting a call back to discuss what she needs to do to get the appointment scheduled. Please advise.

## 2022-09-29 DIAGNOSIS — K8020 Calculus of gallbladder without cholecystitis without obstruction: Secondary | ICD-10-CM | POA: Diagnosis not present

## 2022-10-06 ENCOUNTER — Other Ambulatory Visit: Payer: Medicaid Other | Admitting: *Deleted

## 2022-10-06 NOTE — Patient Outreach (Signed)
Care Coordination  10/06/2022  Tamara Church 1976-02-03 [id number]  Successful outreach with Ms. Kaneshiro today. However, she is at work and needs to reschedule to a Monday when she will be off work. A new appointment was scheduled for 10/26/22 @ 2:30pm. Patient agrees to date and time.  Lurena Joiner RN, BSN [place]  [employer] manager

## 2022-10-16 DIAGNOSIS — K8010 Calculus of gallbladder with chronic cholecystitis without obstruction: Secondary | ICD-10-CM | POA: Diagnosis not present

## 2022-10-16 DIAGNOSIS — K660 Peritoneal adhesions (postprocedural) (postinfection): Secondary | ICD-10-CM | POA: Diagnosis not present

## 2022-10-16 DIAGNOSIS — K8020 Calculus of gallbladder without cholecystitis without obstruction: Secondary | ICD-10-CM | POA: Diagnosis not present

## 2022-10-26 ENCOUNTER — Other Ambulatory Visit: Payer: Medicaid Other | Admitting: *Deleted

## 2022-10-26 ENCOUNTER — Encounter: Payer: Self-pay | Admitting: *Deleted

## 2022-10-26 NOTE — Patient Instructions (Signed)
Visit Information  Ms. Jacquot was given information about Medicaid Managed Care team care coordination services as a part of their [place] Medicaid benefit. [name] verbally consented to engagement with the [facility] Managed Care team.   If you are experiencing a medical emergency, please call 911 or report to your local emergency department or urgent care.   If you have a non-emergency medical problem during routine business hours, please contact your provider's office and ask to speak with a nurse.   For questions related to your [facility], please call: [phone] or visit the homepage here: [email or web address]  If you would like to schedule transportation through your [facility], please call the following number at least 2 days in advance of your appointment: [phone]   Rides for urgent appointments can also be made after hours by calling Member Services.  Call the [place] at [phone], at any time, 24 hours a day, 7 days a week. If you are in danger or need immediate medical attention call 911.  If you would like help to quit smoking, call 1-800-QUIT-NOW 641-881-8707) OR Espaol: 1-855-Djelo-Ya (2-423-536-1443) o para ms informacin haga clic aqu or Text READY to 200-400 to register via text  [name],   Please see education materials related to gall bladder and HTN provided as print materials.   The patient verbalized understanding of instructions, educational materials, and care plan provided today and agreed to receive a mailed copy of patient instructions, educational materials, and care plan.   Telephone follow up appointment with Managed Medicaid care management team member scheduled for:11/25/22 @ 2:30pm  Lurena Joiner RN, BSN [place] RN Care Coordinator   Following is a  copy of your plan of care:  Care Plan : RN Care Manager Plan of Care  Updates made by Melissa Montane, RN since 10/26/2022 12:00 AM     Problem: Health Management needs related to HTN and Abdominal Pain      Long-Range Goal: Development of Plan of Care to address Health Management needs related to HTN and Abdominal Pain   Start Date: 10/26/2022  Expected End Date: 01/24/2023  Note:   Current Barriers:  Chronic Disease Management support and education needs related to HTN and Abdominal Pain  RNCM Clinical Goal(s):  Patient will verbalize understanding of plan for management of HTN and Abdominal Pain as evidenced by patient reports take all medications exactly as prescribed and will call provider for medication related questions as evidenced by patient reports and EMR documentation    attend all scheduled medical appointments: 11/11/22 for post op visit as evidenced by provider documentation in EMR          Interventions: Inter-disciplinary care team collaboration (see longitudinal plan of care) Evaluation of current treatment plan related to  self management and patient's adherence to plan as established by provider Collaborate with BSW for assistance with Utilities and needing a dental provider Advised patient to go online to [employer] to apply for financial assistance   Hypertension: (Status: New goal.) Long Term Goal  Last practice recorded BP readings:  BP Readings from Last 3 Encounters:  09/21/22 124/85  09/19/22 (!) 135/90  09/12/22 (!) 142/93  Most recent eGFR/CrCl:  Lab Results  Component Value Date   EGFR 74 10/22/2021    No components found for: "CRCL"  Evaluation of current treatment plan related to hypertension self management and patient's adherence to  plan as established by provider;   Provided education to patient re: stroke prevention, s/s of heart attack and stroke; Reviewed prescribed diet heart healthy Reviewed medications with patient and discussed importance  of compliance;  Counseled on the importance of exercise goals with target of 150 minutes per week Discussed plans with patient for ongoing care management follow up and provided patient with direct contact information for care management team; Reviewed scheduled/upcoming provider appointments including:  Assessed social determinant of health barriers;   Pain:  (Status: New goal.) Long Term Goal  Pain assessment performed Medications reviewed Discussed importance of adherence to all scheduled medical appointments; Advised patient to report to care team affect of pain on daily activities; Assessed social determinant of health barriers;  Discussed recent Cholecystectomy Advised patient to attend post op follow up on 11/11/22 Discussed foods to avoid, provided patient with education  Patient Goals/Self-Care Activities: Take medications as prescribed   Attend all scheduled provider appointments Call provider office for new concerns or questions  Work with the social worker to address care coordination needs and will continue to work with the clinical team to address health care and disease management related needs keep a blood pressure log eat more whole grains, fruits and vegetables, lean meats and healthy fats Schedule a follow up with PCP

## 2022-10-26 NOTE — Patient Outreach (Signed)
Medicaid Managed Care   Nurse Care Manager Note  10/26/2022 Name:  Tamara Church MRN:  [id number] DOB:  1975/10/17  Tamara Church is an 47 y.o. year old female who is a primary patient of Ezequiel Essex, MD.  The [facility] Managed Care Coordination team was consulted for assistance with:    HTN Abdominal Pain  Tamara Church was given information about Medicaid Managed Care Coordination team services today. Tamara Church Patient agreed to services and verbal consent obtained.  Engaged with patient by telephone for follow up visit in response to provider referral for case management and/or care coordination services.   Assessments/Interventions:  Review of past medical history, allergies, medications, health status, including review of consultants reports, laboratory and other test data, was performed as part of comprehensive evaluation and provision of chronic care management services.  SDOH (Social Determinants of Health) assessments and interventions performed: SDOH Interventions    Flowsheet Row Patient Outreach Telephone from 10/26/2022 in [place] Telephone from 09/22/2022 in [place] Telephone from 08/17/2022 in [place] Telephone from 08/10/2022 in [place] Interventions -- -- -- Intervention Not Indicated  Housing Interventions Intervention Not Indicated -- -- --  Transportation Interventions Intervention Not Indicated Intervention Not Indicated Intervention Not Indicated Intervention Not Indicated  Utilities Interventions Other (Comment)  [Assisted with [employer] financial assistance] -- -- --       Care Plan  Allergies  Allergen Reactions   Ace Inhibitors Cough   Angiotensin Receptor Blockers     cough   Hctz [Hydrochlorothiazide]     hypokalemia    Medications Reviewed Today     Reviewed by Melissa Montane, RN (Registered Nurse) on 10/26/22 at [place] List Status: <None>   Medication Order Taking? Sig Documenting Provider Last Dose Status Informant  alum & mag hydroxide-simeth (MAALOX MULTI SYMPTOM MAX ST) 371-696-78 MG/5ML suspension [id number] No Take 15 mLs by mouth every 6 (six) hours as needed for indigestion.  Patient not taking: Reported on 10/26/2022   Tretha Sciara, MD Not Taking Active   amLODipine (NORVASC) 10 MG tablet [id number] Yes Take 1 tablet (10 mg total) by mouth daily. Ezequiel Essex, MD Taking Active   amoxicillin-clavulanate (AUGMENTIN) 875-125 MG tablet [id number] No Take 1 tablet by mouth every 12 (twelve) hours.  Patient not taking: Reported on 10/26/2022   Anselmo Pickler, [place] Not Taking Active   chlorhexidine (PERIDEX) 0.12 % solution [id number] No Use as directed 15 mLs in the mouth or throat 2 (two) times daily.  Patient not taking: Reported on 10/26/2022   Carmin Muskrat, MD Not Taking Active   dicyclomine (BENTYL) 20 MG tablet [id number] No Take 1 tablet (20 mg total) by mouth 2 (two) times daily.  Patient not taking: Reported on 09/22/2022   Willia Craze, NP Not Taking Active   famotidine (PEPCID) 20 MG tablet [id number] No Take 1 tablet (20 mg total) by mouth 2 (two) times daily.  Patient not taking: Reported on 08/29/2022   Tretha Sciara, MD Not Taking Active   levonorgestrel (MIRENA) 20 MCG/24HR IUD [id number] Yes 1 each by Intrauterine route once. [provider] Taking Active Self           Med Note Josefine Class   Mon May 30, 2018  8:00 PM)    metoCLOPramide (REGLAN) 10 MG tablet [id number] No Take 1  tablet (10 mg total) by mouth every 6 (six) hours.  Patient not taking: Reported on 08/29/2022   Tretha Sciara, MD Not Taking Active   ondansetron (ZOFRAN) 4 MG tablet [id number] No Take 1 tablet (4 mg total) by mouth every 6 (six) hours.  Patient not taking: Reported on 10/26/2022   Anselmo Pickler, PA-C Not Taking  Active   promethazine (PHENERGAN) 25 MG suppository [id number] No Place 1 suppository (25 mg total) rectally every 6 (six) hours as needed for nausea or vomiting.  Patient not taking: Reported on 08/29/2022   Margarita Mail, PA-C Not Taking Active   RABEprazole (ACIPHEX) 20 MG tablet [id number] Yes Take 1 tablet (20 mg total) by mouth 2 (two) times daily. Mansouraty, Telford Nab., MD Taking Active   spironolactone (ALDACTONE) 25 MG tablet [id number] Yes Take 2 tablets (50 mg total) by mouth at bedtime. Ezequiel Essex, MD Taking Active   sucralfate (CARAFATE) 1 g tablet [id number] Yes Take 1 tablet (1 g total) by mouth 2 (two) times daily. Mansouraty, Telford Nab., MD Taking Active             Patient Active Problem List   Diagnosis Date Noted   Viral gastroenteritis 07/03/2022   Right arm pain 10/22/2021   Prediabetes 10/08/2021   Colon cancer screening 07/05/2021   Adjustment disorder with depressed mood 03/15/2020   Seasonal allergies 02/08/2020   Decreased hearing 12/15/2019   Healthcare maintenance 07/14/2019   CKD (chronic kidney disease) stage 2, GFR 60-89 ml/min 03/21/2012   GERD 09/04/2008   OBESITY, NOS 11/18/2006   HYPERTENSION, BENIGN SYSTEMIC 11/18/2006    Conditions to be addressed/monitored per PCP order:  HTN and Abdominal Pain  Care Plan : RN Care Manager Plan of Care  Updates made by Melissa Montane, RN since 10/26/2022 12:00 AM     Problem: Health Management needs related to HTN and Abdominal Pain      Long-Range Goal: Development of Plan of Care to address Health Management needs related to HTN and Abdominal Pain   Start Date: 10/26/2022  Expected End Date: 01/24/2023  Note:   Current Barriers:  Chronic Disease Management support and education needs related to HTN and Abdominal Pain  RNCM Clinical Goal(s):  Patient will verbalize understanding of plan for management of HTN and Abdominal Pain as evidenced by patient reports take all medications exactly as  prescribed and will call provider for medication related questions as evidenced by patient reports and EMR documentation    attend all scheduled medical appointments: 11/11/22 for post op visit as evidenced by provider documentation in EMR          Interventions: Inter-disciplinary care team collaboration (see longitudinal plan of care) Evaluation of current treatment plan related to  self management and patient's adherence to plan as established by provider Collaborate with BSW for assistance with Utilities and needing a dental provider Advised patient to go online to [employer] to apply for financial assistance   Hypertension: (Status: New goal.) Long Term Goal  Last practice recorded BP readings:  BP Readings from Last 3 Encounters:  09/21/22 124/85  09/19/22 (!) 135/90  09/12/22 (!) 142/93  Most recent eGFR/CrCl:  Lab Results  Component Value Date   EGFR 74 10/22/2021    No components found for: "CRCL"  Evaluation of current treatment plan related to hypertension self management and patient's adherence to plan as established by provider;   Provided education to patient re: stroke prevention, s/s of heart attack and stroke; Reviewed  prescribed diet heart healthy Reviewed medications with patient and discussed importance of compliance;  Counseled on the importance of exercise goals with target of 150 minutes per week Discussed plans with patient for ongoing care management follow up and provided patient with direct contact information for care management team; Reviewed scheduled/upcoming provider appointments including:  Assessed social determinant of health barriers;   Pain:  (Status: New goal.) Long Term Goal  Pain assessment performed Medications reviewed Discussed importance of adherence to all scheduled medical appointments; Advised patient to report to care team affect of pain on daily activities; Assessed social determinant of health barriers;  Discussed recent  Cholecystectomy Advised patient to attend post op follow up on 11/11/22 Discussed foods to avoid, provided patient with education  Patient Goals/Self-Care Activities: Take medications as prescribed   Attend all scheduled provider appointments Call provider office for new concerns or questions  Work with the social worker to address care coordination needs and will continue to work with the clinical team to address health care and disease management related needs keep a blood pressure log eat more whole grains, fruits and vegetables, lean meats and healthy fats Schedule a follow up with PCP       Follow Up:  Patient agrees to Care Plan and Follow-up.  Plan: The Managed Medicaid care management team will reach out to the patient again over the next 30 days.  Date/time of next scheduled RN care management/care coordination outreach:  11/25/22 @ 2:30pm  Lurena Joiner RN, BSN [place] RN Care Coordinator

## 2022-10-28 ENCOUNTER — Other Ambulatory Visit: Payer: Medicaid Other

## 2022-10-28 NOTE — Patient Outreach (Signed)
Medicaid Managed Care Social Work Note  10/28/2022 Name:  Tamara Church MRN:  [id number] DOB:  1976/06/29  Tamara Church is an 47 y.o. year old female who is a primary patient of Ezequiel Essex, MD.  The Medicaid Managed Care Coordination team was consulted for assistance with:  Community Resources   Ms. Frayre was given information about Medicaid Managed Care Coordination team services today. Tamara Church Patient agreed to services and verbal consent obtained.  Engaged with patient  for by telephone forinitial visit in response to referral for case management and/or care coordination services.   Assessments/Interventions:  Review of past medical history, allergies, medications, health status, including review of consultants reports, laboratory and other test data, was performed as part of comprehensive evaluation and provision of chronic care management services.  SDOH: (Social Determinant of Health) assessments and interventions performed: SDOH Interventions    Flowsheet Row Patient Outreach Telephone from 10/26/2022 in [place] Telephone from 09/22/2022 in [place] Telephone from 08/17/2022 in [place] Telephone from 08/10/2022 in [place] Interventions      Food Insecurity Interventions -- -- -- Intervention Not Indicated  Housing Interventions Intervention Not Indicated -- -- --  Transportation Interventions Intervention Not Indicated Intervention Not Indicated Intervention Not Indicated Intervention Not Indicated  Utilities Interventions Other (Comment)  [Assisted with [employer] financial assistance] -- -- --     BSW completed a telephone outreach with patient. She stated dental and utiltiy resources are still needed. She does not have a cut off notice, but one should be coming next week. BSW informed patient to contact DSS if she gets the  disconnect notice before she receives the resources. Patient asked for resources to be mailed to her. No other resources are needed at this time.  Advanced Directives Status:  Not addressed in this encounter.  Care Plan                 Allergies  Allergen Reactions   Ace Inhibitors Cough   Angiotensin Receptor Blockers     cough   Hctz [Hydrochlorothiazide]     hypokalemia    Medications Reviewed Today     Reviewed by Melissa Montane, RN (Registered Nurse) on 10/26/22 at 1440  Med List Status: <None>   Medication Order Taking? Sig Documenting Provider Last Dose Status Informant  alum & mag hydroxide-simeth (MAALOX MULTI SYMPTOM MAX ST) [id number] MG/5ML suspension [id number] No Take 15 mLs by mouth every 6 (six) hours as needed for indigestion.  Patient not taking: Reported on 10/26/2022   Tretha Sciara, MD Not Taking Active   amLODipine (NORVASC) 10 MG tablet [id number] Yes Take 1 tablet (10 mg total) by mouth daily. Ezequiel Essex, MD Taking Active   amoxicillin-clavulanate (AUGMENTIN) 875-125 MG tablet [id number] No Take 1 tablet by mouth every 12 (twelve) hours.  Patient not taking: Reported on 10/26/2022   Anselmo Pickler, [place] Not Taking Active   chlorhexidine (PERIDEX) 0.12 % solution [id number] No Use as directed 15 mLs in the mouth or throat 2 (two) times daily.  Patient not taking: Reported on 10/26/2022   Carmin Muskrat, MD Not Taking Active   dicyclomine (BENTYL) 20 MG tablet [id number] No Take 1 tablet (20 mg total) by mouth 2 (two) times daily.  Patient not taking: Reported on 09/22/2022   Willia Craze, NP Not Taking Active   famotidine (PEPCID) 20 MG tablet [id number] No Take  1 tablet (20 mg total) by mouth 2 (two) times daily.  Patient not taking: Reported on 08/29/2022   Tretha Sciara, MD Not Taking Active   levonorgestrel (MIRENA) 20 MCG/24HR IUD [id number] Yes 1 each by Intrauterine route once. [provider] Taking Active Self           Med  Note Josefine Class   Mon May 30, 2018  8:00 PM)    metoCLOPramide (REGLAN) 10 MG tablet [id number] No Take 1 tablet (10 mg total) by mouth every 6 (six) hours.  Patient not taking: Reported on 08/29/2022   Tretha Sciara, MD Not Taking Active   ondansetron (ZOFRAN) 4 MG tablet [id number] No Take 1 tablet (4 mg total) by mouth every 6 (six) hours.  Patient not taking: Reported on 10/26/2022   Anselmo Pickler, PA-C Not Taking Active   promethazine (PHENERGAN) 25 MG suppository [id number] No Place 1 suppository (25 mg total) rectally every 6 (six) hours as needed for nausea or vomiting.  Patient not taking: Reported on 08/29/2022   Margarita Mail, PA-C Not Taking Active   RABEprazole (ACIPHEX) 20 MG tablet [id number] Yes Take 1 tablet (20 mg total) by mouth 2 (two) times daily. Mansouraty, Telford Nab., MD Taking Active   spironolactone (ALDACTONE) 25 MG tablet [id number] Yes Take 2 tablets (50 mg total) by mouth at bedtime. Ezequiel Essex, MD Taking Active   sucralfate (CARAFATE) 1 g tablet [id number] Yes Take 1 tablet (1 g total) by mouth 2 (two) times daily. Mansouraty, Telford Nab., MD Taking Active             Patient Active Problem List   Diagnosis Date Noted   Viral gastroenteritis 07/03/2022   Right arm pain 10/22/2021   Prediabetes 10/08/2021   Colon cancer screening 07/05/2021   Adjustment disorder with depressed mood 03/15/2020   Seasonal allergies 02/08/2020   Decreased hearing 12/15/2019   Healthcare maintenance 07/14/2019   CKD (chronic kidney disease) stage 2, GFR 60-89 ml/min 03/21/2012   GERD 09/04/2008   OBESITY, NOS 11/18/2006   HYPERTENSION, BENIGN SYSTEMIC 11/18/2006    Conditions to be addressed/monitored per PCP order:   community resources  Care Plan : [place] of Care  Updates made by Ethelda Chick since 10/28/2022 12:00 AM     Problem: Health Management needs related to HTN and Abdominal Pain      Long-Range Goal: Development of  Plan of Care to address Health Management needs related to HTN and Abdominal Pain   Start Date: 10/26/2022  Expected End Date: 01/24/2023  Note:   Current Barriers:  Chronic Disease Management support and education needs related to HTN and Abdominal Pain  RNCM Clinical Goal(s):  Patient will verbalize understanding of plan for management of HTN and Abdominal Pain as evidenced by patient reports take all medications exactly as prescribed and will call provider for medication related questions as evidenced by patient reports and EMR documentation    attend all scheduled medical appointments: 11/11/22 for post op visit as evidenced by provider documentation in EMR          Interventions: Inter-disciplinary care team collaboration (see longitudinal plan of care) Evaluation of current treatment plan related to  self management and patient's adherence to plan as established by provider Collaborate with BSW for assistance with Utilities and needing a dental provider Advised patient to go online to [employer] to apply for financial assistance BSW completed a telephone outreach with patient. She stated dental and utiltiy  resources are still needed. She does not have a cut off notice, but one should be coming next week. BSW informed patient to contact DSS if she gets the disconnect notice before she receives the resources. Patient asked for resources to be mailed to her. No other resources are needed at this time.  Hypertension: (Status: New goal.) Long Term Goal  Last practice recorded BP readings:  BP Readings from Last 3 Encounters:  09/21/22 124/85  09/19/22 (!) 135/90  09/12/22 (!) 142/93  Most recent eGFR/CrCl:  Lab Results  Component Value Date   EGFR 74 10/22/2021    No components found for: "CRCL"  Evaluation of current treatment plan related to hypertension self management and patient's adherence to plan as established by provider;   Provided education to patient re: stroke prevention,  s/s of heart attack and stroke; Reviewed prescribed diet heart healthy Reviewed medications with patient and discussed importance of compliance;  Counseled on the importance of exercise goals with target of 150 minutes per week Discussed plans with patient for ongoing care management follow up and provided patient with direct contact information for care management team; Reviewed scheduled/upcoming provider appointments including:  Assessed social determinant of health barriers;   Pain:  (Status: New goal.) Long Term Goal  Pain assessment performed Medications reviewed Discussed importance of adherence to all scheduled medical appointments; Advised patient to report to care team affect of pain on daily activities; Assessed social determinant of health barriers;  Discussed recent Cholecystectomy Advised patient to attend post op follow up on 11/11/22 Discussed foods to avoid, provided patient with education  Patient Goals/Self-Care Activities: Take medications as prescribed   Attend all scheduled provider appointments Call provider office for new concerns or questions  Work with the social worker to address care coordination needs and will continue to work with the clinical team to address health care and disease management related needs keep a blood pressure log eat more whole grains, fruits and vegetables, lean meats and healthy fats Schedule a follow up with PCP       Follow up:  Patient agrees to Care Plan and Follow-up.  Plan: The Managed Medicaid care management team will reach out to the patient again over the next 14 days.  Date/time of next scheduled Social Work care management/care coordination outreach:  11/17/22  Mickel Fuchs, Arita Miss, [place] Medicaid Team  [phone]

## 2022-10-28 NOTE — Patient Instructions (Signed)
Visit Information  Tamara Church was given information about Medicaid Managed Church team Church coordination Church as a part of their Tamara Church Medicaid benefit. Tamara Church verbally consented to engagement with the Tamara Church Managed Church team.   If you are experiencing a medical emergency, please call 911 or report to your local emergency department or urgent Church.   If you have a non-emergency medical problem during routine business hours, please contact your provider's office and ask to speak with a nurse.   For questions related to your Tamara Church, please call: [phone] or visit the homepage here: [email or web address]  If you would like to schedule transportation through your Tamara Church, please call the following number at least 2 days in advance of your appointment: [phone]   Rides for urgent appointments can also be made after hours by calling Tamara Church.  Call the Tamara Church at [phone], at any time, 24 hours a day, 7 days a week. If you are in danger or need immediate medical attention call 911.  If you would like help to quit smoking, call 1-800-QUIT-NOW 330 202 6854) OR Espaol: 1-855-Djelo-Ya (3-474-259-5638) o para ms informacin haga clic aqu or Text READY to 200-400 to register via text  Tamara Church - following are the goals we discussed in your visit today:   Goals Addressed   None      Social Worker will follow up on 11/17/22.   Tamara Church, Tamara Church, [place]  High Risk Managed Medicaid Team  [phone]   Following is a copy of your plan of Church:  Church Plan : Tamara Church  Updates made by Tamara Church since 10/28/2022 12:00 AM     Problem: Health Management needs related to HTN and Abdominal Pain      Long-Range Goal: Development of Plan of  Church to address Health Management needs related to HTN and Abdominal Pain   Start Date: 10/26/2022  Expected End Date: 01/24/2023  Note:   Current Barriers:  Chronic Disease Management support and education needs related to HTN and Abdominal Pain  RNCM Clinical Goal(s):  Patient will verbalize understanding of plan for management of HTN and Abdominal Pain as evidenced by patient reports take all medications exactly as prescribed and will call provider for medication related questions as evidenced by patient reports and EMR documentation    attend all scheduled medical appointments: 11/11/22 for post op visit as evidenced by provider documentation in EMR          Interventions: Inter-disciplinary Church team collaboration (see longitudinal plan of Church) Evaluation of current treatment plan related to  self management and patient's adherence to plan as established by provider Collaborate with Tamara Church for assistance with Utilities and needing a dental provider Advised patient to go online to [employer] to apply for financial assistance Tamara Church completed a telephone outreach with patient. She stated dental and utiltiy resources are still needed. She does not have a cut off notice, but one should be coming next week. Tamara Church informed patient to contact DSS if she gets the disconnect notice before she receives the resources. Patient asked for resources to be mailed to her. No other resources are needed at this time.  Hypertension: (Status: New goal.) Long Term Goal  Last practice recorded BP readings:  BP Readings from Last 3 Encounters:  09/21/22 124/85  09/19/22 (!) 135/90  09/12/22 (!) 142/93  Most  recent eGFR/CrCl:  Lab Results  Component Value Date   EGFR 74 10/22/2021    No components found for: "CRCL"  Evaluation of current treatment plan related to hypertension self management and patient's adherence to plan as established by provider;   Provided education to patient re: stroke prevention, s/s of  heart attack and stroke; Reviewed prescribed diet heart healthy Reviewed medications with patient and discussed importance of compliance;  Counseled on the importance of exercise goals with target of 150 minutes per week Discussed plans with patient for ongoing Church management follow up and provided patient with direct contact information for Church management team; Reviewed scheduled/upcoming provider appointments including:  Assessed social determinant of health barriers;   Pain:  (Status: New goal.) Long Term Goal  Pain assessment performed Medications reviewed Discussed importance of adherence to all scheduled medical appointments; Advised patient to report to Church team affect of pain on daily activities; Assessed social determinant of health barriers;  Discussed recent Cholecystectomy Advised patient to attend post op follow up on 11/11/22 Discussed foods to avoid, provided patient with education  Patient Goals/Self-Church Activities: Take medications as prescribed   Attend all scheduled provider appointments Call provider office for new concerns or questions  Work with the social worker to address Church coordination needs and will continue to work with the clinical team to address health Church and disease management related needs keep a blood pressure log eat more whole grains, fruits and vegetables, lean meats and healthy fats Schedule a follow up with PCP

## 2022-10-29 ENCOUNTER — Ambulatory Visit: Payer: Commercial Managed Care - HMO | Admitting: Internal Medicine

## 2022-10-29 NOTE — Progress Notes (Deleted)
Cardiology Office Note:    Date:  10/29/2022   ID:  Tamara Church, DOB March 30, 1976, MRN [id number]  PCP:  Ezequiel Essex, MD  [facility] Cardiologist:  Rudean Haskell MD [place] Electrophysiologist:  None   CC: Tachycardia f/u  History of Present Illness:    Tamara Church is a 47 y.o. female with a hx of HTN, anemia, with recent viral prodrome who presents for evaluation 10/04/20 with a 08/3020 QTc of 600. In interim of this visit, patient had heart monitor WNL and normal ECGs.  Patient notes that she is doing.  Since ast visit notes no changes.  Relevant interval testing or therapy include heart monitor.  There are no interval hospital/ED visit.    No chest pain or pressure .  No SOB/DOE and no PND/Orthopnea.  No weight gain or leg swelling.  No palpitations or syncope .  Ambulatory blood pressure SBP 125.   Past Medical History:  Diagnosis Date   Abdominal pain, epigastric 07/27/2020   Anemia    ANEMIA 09/07/2008   Qualifier: Diagnosis of  By: Doreene Nest MD, Tamara Church     Bacterial vaginosis 05/08/2020   Calculus of gallbladder without cholecystitis without obstruction 07/05/2021   Constipation 05/18/2020   Early satiety 05/18/2020   Gastritis and gastroduodenitis 07/27/2020   GERD (gastroesophageal reflux disease)    Hypertension    Non-intractable vomiting 05/18/2020   Palpitations 03/22/2020   Prolonged QT interval 10/04/2020   Screening-pulmonary TB 10/08/2021   Sore throat 09/18/2020   Unintentional weight loss 05/18/2020   UTI (urinary tract infection) 04/23/2020    Past Surgical History:  Procedure Laterality Date   NO PAST SURGERIES     UPPER GASTROINTESTINAL ENDOSCOPY      Current Medications: No outpatient medications have been marked as taking for the 10/29/22 encounter (Appointment) with Werner Lean, MD.     Allergies:   Ace inhibitors, Angiotensin receptor blockers, and Hctz [hydrochlorothiazide]   Social History   Socioeconomic History    Marital status: Single    Spouse name: Not on file   Number of children: Not on file   Years of education: Not on file   Highest education level: Not on file  Occupational History   Not on file  Tobacco Use   Smoking status: Never   Smokeless tobacco: Never  Vaping Use   Vaping Use: Never used  Substance and Sexual Activity   Alcohol use: No   Drug use: No   Sexual activity: Yes    Birth control/protection: I.U.D.    Comment: pt would like to start depo provera today  Other Topics Concern   Not on file  Social History Narrative   Not on file   Social Determinants of Health   Financial Resource Strain: Not on file  Food Insecurity: No Food Insecurity (08/10/2022)   Hunger Vital Sign    Worried About Running Out of Food in the Last Year: Never true    Ran Out of Food in the Last Year: Never true  Transportation Needs: No Transportation Needs (10/26/2022)   PRAPARE - Hydrologist (Medical): No    Lack of Transportation (Non-Medical): No  Physical Activity: Not on file  Stress: Not on file  Social Connections: Not on file    Son Died in Police [place] January 9381  Family History: The patient's family history includes Hypertension in her mother. There is no history of Colon cancer, Esophageal cancer, Rectal cancer, Stomach cancer, Inflammatory  bowel disease, Liver disease, or Pancreatic cancer. History of coronary artery disease notable for no members. History of heart failure notable for no members. No history of cardiomyopathies including hypertrophic cardiomyopathy, left ventricular non-compaction, or arrhythmogenic right ventricular cardiomyopathy. History of arrhythmia notable for no members. Denies family history of sudden cardiac death including drowning,  or unexplained deaths in the family.  Son Died in [place], but was being chased by police.  No syncope in family. No history of bicuspid aortic valve or aortic aneurysm or  dissection.  ROS:   Please see the history of present illness.     All other systems reviewed and are negative.  EKGs/Labs/Other Studies Reviewed:    The following studies were reviewed today:  EKG:   11/26/20 10/04/20:  Sinus rhythm QTc 460 09/19/20: Sinus Tachycardia rate 124 QTC 600  Cardiac Event Monitoring: Date: 11/12/20 Results: Patient had a minimum heart rate of 60 bpm, maximum heart rate of 167 bpm, and average heart rate of 87 bpm. Predominant underlying rhythm was sinus rhythm. Isolated PACs were rare (<1.0%), with rare couplets. Isolated PVCs were rare (<1.0%). No evidence of complete heart block; though first degree heart block was noted. Triggered and diary events associated with sinus tachycardia.   No malignant arrhythmias.   Recent Labs: 06/30/2022: Magnesium 2.3 09/21/2022: ALT 15; BUN 9; Creatinine, Ser 0.63; Hemoglobin 13.1; Platelets 285; Potassium 3.1; Sodium 138  Recent Lipid Panel    Component Value Date/Time   CHOL 115 09/04/2008 2002   TRIG 47 09/04/2008 2002   HDL 44 09/04/2008 2002   CHOLHDL 2.6 Ratio 09/04/2008 2002   VLDL 9 09/04/2008 2002   [place] 62 09/04/2008 2002   Risk Assessment/Calculations:     LQTS Score of 2  Physical Exam:    VS:  There were no vitals taken for this visit.    Wt Readings from Last 3 Encounters:  09/21/22 180 lb (81.6 kg)  08/29/22 218 lb (98.9 kg)  08/15/22 218 lb (98.9 kg)    GEN:  Well nourished, well developed in no acute distress HEENT: Normal NECK: No JVD; No carotid bruits LYMPHATICS: No lymphadenopathy CARDIAC: RRR, no murmurs, rubs, gallops RESPIRATORY:  Clear to auscultation without rales, wheezing or rhonchi  ABDOMEN: Soft, non-tender, non-distended MUSCULOSKELETAL:  No edema; No deformity  SKIN: Warm and dry NEUROLOGIC:  Alert and oriented x 3 PSYCHIATRIC:  Normal affect   ASSESSMENT:    No diagnosis found.  PLAN:    In order of problems listed above:  Prolonged QTc - on no  medications - without Twave notching or T wave alternans  - without Torsades - without low resting heart rate  - without stress related syncope  - without congenital deafness - without family hx of LQTS or SCD - LQTS score 2 - no Torsades and resolution of QTc in all but 09/19/20 visit - we discussed symptoms of concern including SOB, CP, Palpitations, near syncope or syncope - discussed medication interactions - low threshold for nadaolol start and EP referral - will get BMP  PRN follow up  Medication Adjustments/Labs and Tests Ordered: Current medicines are reviewed at length with the patient today.  Concerns regarding medicines are outlined above.  No orders of the defined types were placed in this encounter.  No orders of the defined types were placed in this encounter.   There are no Patient Instructions on file for this visit.   Signed, Werner Lean, MD  10/29/2022 12:29 PM    [facility]

## 2022-11-17 ENCOUNTER — Ambulatory Visit: Payer: Medicaid Other

## 2022-11-19 ENCOUNTER — Other Ambulatory Visit: Payer: Self-pay

## 2022-11-19 ENCOUNTER — Other Ambulatory Visit: Payer: Medicaid Other

## 2022-11-19 ENCOUNTER — Other Ambulatory Visit: Payer: Self-pay | Admitting: Gastroenterology

## 2022-11-19 ENCOUNTER — Telehealth: Payer: Self-pay | Admitting: Gastroenterology

## 2022-11-19 DIAGNOSIS — R110 Nausea: Secondary | ICD-10-CM

## 2022-11-19 MED ORDER — SUCRALFATE 1 G PO TABS: 1.0000 g | ORAL_TABLET | Freq: Two times a day (BID) | ORAL | 0 refills | Status: DC

## 2022-11-19 MED ORDER — RABEPRAZOLE SODIUM 20 MG PO TBEC: 20.0000 mg | DELAYED_RELEASE_TABLET | Freq: Two times a day (BID) | ORAL | 0 refills | Status: DC

## 2022-11-19 NOTE — Patient Instructions (Signed)
Visit Information  [name] was given information about Medicaid Managed Care team care coordination services as a part of their [place] Medicaid benefit. Tamara Church verbally consented to engagement with the [facility] Managed Care team.   If you are experiencing a medical emergency, please call 911 or report to your local emergency department or urgent care.   If you have a non-emergency medical problem during routine business hours, please contact your provider's office and ask to speak with a nurse.   For questions related to your [facility], please call: [phone] or visit the homepage here: [email or web address]  If you would like to schedule transportation through your [facility], please call the following number at least 2 days in advance of your appointment: [phone]   Rides for urgent appointments can also be made after hours by calling Member Services.  Call the [place] at [phone], at any time, 24 hours a day, 7 days a week. If you are in danger or need immediate medical attention call 911.  If you would like help to quit smoking, call 1-800-QUIT-NOW 548-318-3562) OR Espaol: 1-855-Djelo-Ya HD:1601594) o para ms informacin haga clic aqu or Text READY to 200-400 to register via text  Tamara Church - following are the goals we discussed in your visit today:   Goals Addressed   None      The  Patient                                              has been provided with contact information for the Managed Medicaid care management team and has been advised to call with any health related questions or concerns.   Tamara Church, BSW, [place]  High Risk Managed Medicaid Team  [phone]   Following is a copy of your plan of care:  Care Plan : [place] of Care   Updates made by Ethelda Chick since 11/19/2022 12:00 AM     Problem: Health Management needs related to HTN and Abdominal Pain      Long-Range Goal: Development of Plan of Care to address Health Management needs related to HTN and Abdominal Pain   Start Date: 10/26/2022  Expected End Date: 01/24/2023  Note:   Current Barriers:  Chronic Disease Management support and education needs related to HTN and Abdominal Pain  RNCM Clinical Goal(s):  Patient will verbalize understanding of plan for management of HTN and Abdominal Pain as evidenced by patient reports take all medications exactly as prescribed and will call provider for medication related questions as evidenced by patient reports and EMR documentation    attend all scheduled medical appointments: 11/11/22 for post op visit as evidenced by provider documentation in EMR          Interventions: Inter-disciplinary care team collaboration (see longitudinal plan of care) Evaluation of current treatment plan related to  self management and patient's adherence to plan as established by provider Collaborate with BSW for assistance with Utilities and needing a dental provider Advised patient to go online to [employer] to apply for financial assistance BSW completed a telephone outreach with patient. She stated dental and utiltiy resources are still needed. She does not have a cut off notice, but one should be coming next week. BSW  informed patient to contact DSS if she gets the disconnect notice before she receives the resources. Patient asked for resources to be mailed to her. No other resources are needed at this time. BSW completed a telephone outreach with patient, she stated she contacted DSS and they stated they would help her. Patient states she is waiting on a telephone call from one of the caseworkers. Patient states no other resources are needed at this time.  Hypertension: (Status: New goal.) Long Term Goal  Last practice recorded BP  readings:  BP Readings from Last 3 Encounters:  09/21/22 124/85  09/19/22 (!) 135/90  09/12/22 (!) 142/93  Most recent eGFR/CrCl:  Lab Results  Component Value Date   EGFR 74 10/22/2021    No components found for: "CRCL"  Evaluation of current treatment plan related to hypertension self management and patient's adherence to plan as established by provider;   Provided education to patient re: stroke prevention, s/s of heart attack and stroke; Reviewed prescribed diet heart healthy Reviewed medications with patient and discussed importance of compliance;  Counseled on the importance of exercise goals with target of 150 minutes per week Discussed plans with patient for ongoing care management follow up and provided patient with direct contact information for care management team; Reviewed scheduled/upcoming provider appointments including:  Assessed social determinant of health barriers;   Pain:  (Status: New goal.) Long Term Goal  Pain assessment performed Medications reviewed Discussed importance of adherence to all scheduled medical appointments; Advised patient to report to care team affect of pain on daily activities; Assessed social determinant of health barriers;  Discussed recent Cholecystectomy Advised patient to attend post op follow up on 11/11/22 Discussed foods to avoid, provided patient with education  Patient Goals/Self-Care Activities: Take medications as prescribed   Attend all scheduled provider appointments Call provider office for new concerns or questions  Work with the social worker to address care coordination needs and will continue to work with the clinical team to address health care and disease management related needs keep a blood pressure log eat more whole grains, fruits and vegetables, lean meats and healthy fats Schedule a follow up with PCP

## 2022-11-19 NOTE — Telephone Encounter (Signed)
Rx sent to pharmacy as requested by patient.

## 2022-11-19 NOTE — Telephone Encounter (Signed)
Inbound call from patient , stated she need a refill for sucralfate and rabeprozole .Please advise

## 2022-11-19 NOTE — Patient Outreach (Signed)
Medicaid Managed Care Social Work Note  11/19/2022 Name:  Tamara Church MRN:  [id number] DOB:  12-17-1975  Tamara Church is an 47 y.o. year old female who is a primary patient of Ezequiel Essex, MD.  The Medicaid Managed Care Coordination team was consulted for assistance with:  Community Resources   Tamara Church was given information about Medicaid Managed Care Coordination team services today. Tamara Church Patient agreed to services and verbal consent obtained.  Engaged with patient  for by telephone forfollow up visit in response to referral for case management and/or care coordination services.   Assessments/Interventions:  Review of past medical history, allergies, medications, health status, including review of consultants reports, laboratory and other test data, was performed as part of comprehensive evaluation and provision of chronic care management services.  SDOH: (Social Determinant of Health) assessments and interventions performed: SDOH Interventions    Flowsheet Row Patient Outreach Telephone from 10/26/2022 in [place] Telephone from 09/22/2022 in [place] Telephone from 08/17/2022 in [place] Telephone from 08/10/2022 in [place]  SDOH Interventions      Food Insecurity Interventions -- -- -- Intervention Not Indicated  Housing Interventions Intervention Not Indicated -- -- --  Transportation Interventions Intervention Not Indicated Intervention Not Indicated Intervention Not Indicated Intervention Not Indicated  Utilities Interventions Other (Comment)  [Assisted with [employer] financial assistance] -- -- --     BSW completed a telephone outreach with patient, she stated she contacted DSS and they stated they would help her. Patient states she is waiting on a telephone call from one of the caseworkers. Patient states no other resources are  needed at this time.  Advanced Directives Status:  Not addressed in this encounter.  Care Plan                 Allergies  Allergen Reactions   Ace Inhibitors Cough   Angiotensin Receptor Blockers     cough   Hctz [Hydrochlorothiazide]     hypokalemia    Medications Reviewed Today     Reviewed by Melissa Montane, RN (Registered Nurse) on 10/26/22 at 1440  Med List Status: <None>   Medication Order Taking? Sig Documenting Provider Last Dose Status Informant  alum & mag hydroxide-simeth (MAALOX MULTI SYMPTOM MAX ST) [id number] MG/5ML suspension [id number] No Take 15 mLs by mouth every 6 (six) hours as needed for indigestion.  Patient not taking: Reported on 10/26/2022   Tretha Sciara, MD Not Taking Active   amLODipine (NORVASC) 10 MG tablet [id number] Yes Take 1 tablet (10 mg total) by mouth daily. Ezequiel Essex, MD Taking Active   amoxicillin-clavulanate (AUGMENTIN) 875-125 MG tablet [id number] No Take 1 tablet by mouth every 12 (twelve) hours.  Patient not taking: Reported on 10/26/2022   Anselmo Pickler, [place] Not Taking Active   chlorhexidine (PERIDEX) 0.12 % solution [id number] No Use as directed 15 mLs in the mouth or throat 2 (two) times daily.  Patient not taking: Reported on 10/26/2022   Carmin Muskrat, MD Not Taking Active   dicyclomine (BENTYL) 20 MG tablet [id number] No Take 1 tablet (20 mg total) by mouth 2 (two) times daily.  Patient not taking: Reported on 09/22/2022   Willia Craze, NP Not Taking Active   famotidine (PEPCID) 20 MG tablet [id number] No Take 1 tablet (20 mg total) by mouth 2 (two) times daily.  Patient not taking: Reported on 08/29/2022   Countryman,  Cheri Rous, MD Not Taking Active   levonorgestrel (MIRENA) 20 MCG/24HR IUD [id number] Yes 1 each by Intrauterine route once. [provider] Taking Active Self           Med Note Josefine Class   Mon May 30, 2018  8:00 PM)    metoCLOPramide (REGLAN) 10 MG tablet [id number] No Take 1 tablet (10  mg total) by mouth every 6 (six) hours.  Patient not taking: Reported on 08/29/2022   Tretha Sciara, MD Not Taking Active   ondansetron (ZOFRAN) 4 MG tablet [id number] No Take 1 tablet (4 mg total) by mouth every 6 (six) hours.  Patient not taking: Reported on 10/26/2022   Anselmo Pickler, PA-C Not Taking Active   promethazine (PHENERGAN) 25 MG suppository [id number] No Place 1 suppository (25 mg total) rectally every 6 (six) hours as needed for nausea or vomiting.  Patient not taking: Reported on 08/29/2022   Margarita Mail, PA-C Not Taking Active   RABEprazole (ACIPHEX) 20 MG tablet [id number] Yes Take 1 tablet (20 mg total) by mouth 2 (two) times daily. Mansouraty, Telford Nab., MD Taking Active   spironolactone (ALDACTONE) 25 MG tablet [id number] Yes Take 2 tablets (50 mg total) by mouth at bedtime. Ezequiel Essex, MD Taking Active   sucralfate (CARAFATE) 1 g tablet [id number] Yes Take 1 tablet (1 g total) by mouth 2 (two) times daily. Mansouraty, Telford Nab., MD Taking Active             Patient Active Problem List   Diagnosis Date Noted   Viral gastroenteritis 07/03/2022   Right arm pain 10/22/2021   Prediabetes 10/08/2021   Colon cancer screening 07/05/2021   Adjustment disorder with depressed mood 03/15/2020   Seasonal allergies 02/08/2020   Decreased hearing 12/15/2019   Healthcare maintenance 07/14/2019   CKD (chronic kidney disease) stage 2, GFR 60-89 ml/min 03/21/2012   GERD 09/04/2008   OBESITY, NOS 11/18/2006   HYPERTENSION, BENIGN SYSTEMIC 11/18/2006    Conditions to be addressed/monitored per PCP order:   community resources  Care Plan : [place] of Care  Updates made by Ethelda Chick since 11/19/2022 12:00 AM     Problem: Health Management needs related to HTN and Abdominal Pain      Long-Range Goal: Development of Plan of Care to address Health Management needs related to HTN and Abdominal Pain   Start Date: 10/26/2022  Expected End Date:  01/24/2023  Note:   Current Barriers:  Chronic Disease Management support and education needs related to HTN and Abdominal Pain  RNCM Clinical Goal(s):  Patient will verbalize understanding of plan for management of HTN and Abdominal Pain as evidenced by patient reports take all medications exactly as prescribed and will call provider for medication related questions as evidenced by patient reports and EMR documentation    attend all scheduled medical appointments: 11/11/22 for post op visit as evidenced by provider documentation in EMR          Interventions: Inter-disciplinary care team collaboration (see longitudinal plan of care) Evaluation of current treatment plan related to  self management and patient's adherence to plan as established by provider Collaborate with BSW for assistance with Utilities and needing a dental provider Advised patient to go online to [employer] to apply for financial assistance BSW completed a telephone outreach with patient. She stated dental and utiltiy resources are still needed. She does not have a cut off notice, but one should be coming next week. BSW informed  patient to contact DSS if she gets the disconnect notice before she receives the resources. Patient asked for resources to be mailed to her. No other resources are needed at this time. BSW completed a telephone outreach with patient, she stated she contacted DSS and they stated they would help her. Patient states she is waiting on a telephone call from one of the caseworkers. Patient states no other resources are needed at this time.  Hypertension: (Status: New goal.) Long Term Goal  Last practice recorded BP readings:  BP Readings from Last 3 Encounters:  09/21/22 124/85  09/19/22 (!) 135/90  09/12/22 (!) 142/93  Most recent eGFR/CrCl:  Lab Results  Component Value Date   EGFR 74 10/22/2021    No components found for: "CRCL"  Evaluation of current treatment plan related to hypertension self  management and patient's adherence to plan as established by provider;   Provided education to patient re: stroke prevention, s/s of heart attack and stroke; Reviewed prescribed diet heart healthy Reviewed medications with patient and discussed importance of compliance;  Counseled on the importance of exercise goals with target of 150 minutes per week Discussed plans with patient for ongoing care management follow up and provided patient with direct contact information for care management team; Reviewed scheduled/upcoming provider appointments including:  Assessed social determinant of health barriers;   Pain:  (Status: New goal.) Long Term Goal  Pain assessment performed Medications reviewed Discussed importance of adherence to all scheduled medical appointments; Advised patient to report to care team affect of pain on daily activities; Assessed social determinant of health barriers;  Discussed recent Cholecystectomy Advised patient to attend post op follow up on 11/11/22 Discussed foods to avoid, provided patient with education  Patient Goals/Self-Care Activities: Take medications as prescribed   Attend all scheduled provider appointments Call provider office for new concerns or questions  Work with the social worker to address care coordination needs and will continue to work with the clinical team to address health care and disease management related needs keep a blood pressure log eat more whole grains, fruits and vegetables, lean meats and healthy fats Schedule a follow up with PCP       Follow up:  Patient agrees to Care Plan and Follow-up.  Plan: The  Patient has been provided with contact information for the Managed Medicaid care management team and has been advised to call with any health related questions or concerns.   Mickel Fuchs, BSW, [place] Managed Medicaid Team  [phone]

## 2022-11-25 ENCOUNTER — Other Ambulatory Visit: Payer: Medicaid Other | Admitting: *Deleted

## 2022-11-25 NOTE — Patient Outreach (Signed)
  Medicaid Managed Care   Unsuccessful Attempt Note   11/25/2022 Name: Tamara Church MRN: [id number] DOB: Jan 28, 1976  Referred by: Ezequiel Essex, MD Reason for referral : High Risk Managed Medicaid (Unsuccessful RNCM follow up telephone outreach)   An unsuccessful telephone outreach was attempted today. The patient was referred to the case management team for assistance with care management and care coordination.    Follow Up Plan: A HIPAA compliant phone message was left for the patient providing contact information and requesting a return call. and The Managed Medicaid care management team will reach out to the patient again over the next 7 days.    Lurena Joiner RN, BSN [place]  [employer] manager

## 2022-11-25 NOTE — Patient Instructions (Signed)
Visit Information  Ms. [name]  - as a part of your Medicaid benefit, you are eligible for care management and care coordination services at no cost or copay. I was unable to reach you by phone today but would be happy to help you with your health related needs. Please feel free to call me @ [phone].   A member of the Managed Medicaid care management team will reach out to you again over the next 7 days.   Lurena Joiner RN, BSN [place]  [employer] manager

## 2022-11-30 ENCOUNTER — Other Ambulatory Visit: Payer: Self-pay

## 2022-11-30 ENCOUNTER — Encounter (HOSPITAL_BASED_OUTPATIENT_CLINIC_OR_DEPARTMENT_OTHER): Payer: Self-pay | Admitting: Emergency Medicine

## 2022-11-30 ENCOUNTER — Telehealth: Payer: Self-pay

## 2022-11-30 ENCOUNTER — Emergency Department (HOSPITAL_BASED_OUTPATIENT_CLINIC_OR_DEPARTMENT_OTHER)
Admission: EM | Admit: 2022-11-30 | Discharge: 2022-11-30 | Disposition: A | Discharge status: Home / Self Care | Payer: Medicaid Other | Attending: Emergency Medicine | Admitting: Emergency Medicine

## 2022-11-30 DIAGNOSIS — Z79899 Other long term (current) drug therapy: Secondary | ICD-10-CM | POA: Diagnosis not present

## 2022-11-30 DIAGNOSIS — R0789 Other chest pain: Secondary | ICD-10-CM | POA: Diagnosis not present

## 2022-11-30 DIAGNOSIS — K219 Gastro-esophageal reflux disease without esophagitis: Secondary | ICD-10-CM | POA: Insufficient documentation

## 2022-11-30 DIAGNOSIS — R110 Nausea: Secondary | ICD-10-CM

## 2022-11-30 DIAGNOSIS — I10 Essential (primary) hypertension: Secondary | ICD-10-CM | POA: Insufficient documentation

## 2022-11-30 LAB — CBC
HCT: 35.5 % — ABNORMAL LOW (ref 36.0–46.0)
Hemoglobin: 11.7 g/dL — ABNORMAL LOW (ref 12.0–15.0)
MCH: 29.2 pg (ref 26.0–34.0)
MCHC: 33 g/dL (ref 30.0–36.0)
MCV: 88.5 fL (ref 80.0–100.0)
Platelets: 303 10*3/uL (ref 150–400)
RBC: 4.01 MIL/uL (ref 3.87–5.11)
RDW: 14.1 % (ref 11.5–15.5)
WBC: 7.6 10*3/uL (ref 4.0–10.5)
nRBC: 0 % (ref 0.0–0.2)

## 2022-11-30 LAB — COMPREHENSIVE METABOLIC PANEL
ALT: 21 U/L (ref 0–44)
AST: 17 U/L (ref 15–41)
Albumin: 4.5 g/dL (ref 3.5–5.0)
Alkaline Phosphatase: 54 U/L (ref 38–126)
Anion gap: 10 (ref 5–15)
BUN: 11 mg/dL (ref 6–20)
CO2: 25 mmol/L (ref 22–32)
Calcium: 10.3 mg/dL (ref 8.9–10.3)
Chloride: 101 mmol/L (ref 98–111)
Creatinine, Ser: 0.76 mg/dL (ref 0.44–1.00)
GFR, Estimated: >60 mL/min (ref >60)
Glucose, Bld: 127 mg/dL — ABNORMAL HIGH (ref 70–99)
Potassium: 3.9 mmol/L (ref 3.5–5.1)
Sodium: 136 mmol/L (ref 135–145)
Total Bilirubin: 0.5 mg/dL (ref 0.3–1.2)
Total Protein: 8.4 g/dL — ABNORMAL HIGH (ref 6.5–8.1)

## 2022-11-30 LAB — TROPONIN I (HIGH SENSITIVITY): Troponin I (High Sensitivity): <2 ng/L (ref <18)

## 2022-11-30 LAB — LIPASE, BLOOD: Lipase: 11 U/L (ref 11–51)

## 2022-11-30 MED ORDER — ONDANSETRON HCL 4 MG/2ML IJ SOLN
4.0000 mg | Freq: Once | INTRAMUSCULAR | Status: DC
  Filled 2022-11-30: qty 2

## 2022-11-30 MED ORDER — ACETAMINOPHEN 500 MG PO TABS
1000.0000 mg | ORAL_TABLET | Freq: Once | ORAL | Status: AC
  Administered 2022-11-30: 1000 mg via ORAL
  Filled 2022-11-30: qty 2

## 2022-11-30 MED ORDER — FAMOTIDINE IN NACL 20-0.9 MG/50ML-% IV SOLN
20.0000 mg | Freq: Once | INTRAVENOUS | Status: AC
  Administered 2022-11-30: 20 mg via INTRAVENOUS
  Filled 2022-11-30: qty 50

## 2022-11-30 MED ORDER — SODIUM CHLORIDE 0.9 % IV BOLUS
1000.0000 mL | Freq: Once | INTRAVENOUS | Status: AC
  Administered 2022-11-30: 1000 mL via INTRAVENOUS

## 2022-11-30 MED ORDER — ALUM & MAG HYDROXIDE-SIMETH 200-200-20 MG/5ML PO SUSP
30.0000 mL | Freq: Once | ORAL | Status: AC
  Administered 2022-11-30: 30 mL via ORAL
  Filled 2022-11-30: qty 30

## 2022-11-30 MED ORDER — DROPERIDOL 2.5 MG/ML IJ SOLN
1.2500 mg | Freq: Once | INTRAMUSCULAR | Status: AC
  Administered 2022-11-30: 1.25 mg via INTRAVENOUS
  Filled 2022-11-30: qty 2

## 2022-11-30 MED ORDER — ONDANSETRON HCL 4 MG PO TABS: 4.0000 mg | ORAL_TABLET | Freq: Three times a day (TID) | ORAL | 0 refills | Status: DC | PRN

## 2022-11-30 MED ORDER — SUCRALFATE 1 G PO TABS: 1.0000 g | ORAL_TABLET | Freq: Two times a day (BID) | ORAL | 0 refills | Status: DC

## 2022-11-30 MED ORDER — FAMOTIDINE 20 MG PO TABS: 20.0000 mg | ORAL_TABLET | Freq: Two times a day (BID) | ORAL | 0 refills | Status: DC

## 2022-11-30 NOTE — ED Notes (Signed)
RT in to place patient back on monitor after returning from bathroom. Patient stated that she needed to leave to get her daughter at 10pm and that she probably wasn't staying. RN aware.

## 2022-11-30 NOTE — Discharge Instructions (Addendum)
It was a pleasure taking care of you today!  Your labs did not show any concerning emergent findings today.  It is important that you maintain your follow-up with your GI doctor at the end of the month as scheduled.  You have been sent prescriptions for Pepcid, Zofran, Carafate, take as prescribed.  Return to the emergency department if you are experiencing increasing/worsening symptoms.

## 2022-11-30 NOTE — ED Triage Notes (Signed)
Pt arrives to ED with c/o nausea that started today. Associated with heartburn and burping. She denies vomiting, diarrhea, and abdominal pain.

## 2022-11-30 NOTE — ED Provider Notes (Signed)
[facility] Provider Note   CSN: [id number] Arrival date & time: 11/30/22  1440     History  Chief Complaint  Patient presents with   Nausea    Tamara Church is a 47 y.o. female who presents emergency department with concerns for nausea onset today.  Notes her nausea is associated with heartburn as well as burping.  Has a history of GERD and has been compliant with her omeprazole and Carafate.  Has a follow-up appointment with her GI specialist at the end of March 2024.  Tried Zofran at home without relief of her symptoms.  Had a cholecystectomy in January 2024.  Denies chest pain, shortness of breath, abdominal pain, diarrhea, constipation, vomiting.   The history is provided by the patient. No language interpreter was used.       Home Medications Prior to Admission medications   Medication Sig Start Date End Date Taking? Authorizing Provider  alum & mag hydroxide-simeth (MAALOX MULTI SYMPTOM MAX ST) 400-400-40 MG/5ML suspension Take 15 mLs by mouth every 6 (six) hours as needed for indigestion. Patient not taking: Reported on 10/26/2022 08/05/22   Tretha Sciara, MD  amLODipine (NORVASC) 10 MG tablet Take 1 tablet (10 mg total) by mouth daily. 07/31/22   Ezequiel Essex, MD  amoxicillin-clavulanate (AUGMENTIN) 875-125 MG tablet Take 1 tablet by mouth every 12 (twelve) hours. Patient not taking: Reported on 10/26/2022 09/21/22   Prosperi, Christian H, PA-C  chlorhexidine (PERIDEX) 0.12 % solution Use as directed 15 mLs in the mouth or throat 2 (two) times daily. Patient not taking: Reported on 10/26/2022 08/19/22   Carmin Muskrat, MD  dicyclomine (BENTYL) 20 MG tablet Take 1 tablet (20 mg total) by mouth 2 (two) times daily. Patient not taking: Reported on 09/22/2022 08/04/22   Willia Craze, NP  famotidine (PEPCID) 20 MG tablet Take 1 tablet (20 mg total) by mouth 2 (two) times daily. Patient not taking: Reported on 08/29/2022 08/05/22    Tretha Sciara, MD  levonorgestrel (MIRENA) 20 MCG/24HR IUD 1 each by Intrauterine route once.    [provider]  metoCLOPramide (REGLAN) 10 MG tablet Take 1 tablet (10 mg total) by mouth every 6 (six) hours. Patient not taking: Reported on 08/29/2022 08/05/22   Tretha Sciara, MD  ondansetron (ZOFRAN) 4 MG tablet Take 1 tablet (4 mg total) by mouth every 6 (six) hours. Patient not taking: Reported on 10/26/2022 09/21/22   Prosperi, Christian H, PA-C  promethazine (PHENERGAN) 25 MG suppository Place 1 suppository (25 mg total) rectally every 6 (six) hours as needed for nausea or vomiting. Patient not taking: Reported on 08/29/2022 08/07/22   Margarita Mail, PA-C  RABEprazole (ACIPHEX) 20 MG tablet Take 1 tablet (20 mg total) by mouth 2 (two) times daily. 11/19/22   Mansouraty, Telford Nab., MD  spironolactone (ALDACTONE) 25 MG tablet Take 2 tablets (50 mg total) by mouth at bedtime. 09/17/22   Ezequiel Essex, MD  sucralfate (CARAFATE) 1 g tablet Take 1 tablet (1 g total) by mouth 2 (two) times daily. 11/19/22   Mansouraty, Telford Nab., MD      Allergies    Ace inhibitors, Angiotensin receptor blockers, and Hctz [hydrochlorothiazide]    Review of Systems   Review of Systems  Respiratory:  Negative for shortness of breath.   Cardiovascular:  Negative for chest pain.  Gastrointestinal:  Positive for nausea. Negative for abdominal pain, constipation, diarrhea and vomiting.  All other systems reviewed and are negative.   Physical  Exam Updated Vital Signs BP (!) 131/102 (BP Location: Right Arm)   Pulse 100   Temp 97.7 F (36.5 C) (Temporal)   Resp 16   Ht '5\' 1"'$  (1.549 m)   Wt 90.7 kg   SpO2 98%   BMI 37.79 kg/m  Physical Exam Vitals and nursing note reviewed.  Constitutional:      General: She is not in acute distress.    Appearance: She is not diaphoretic.  HENT:     Head: Normocephalic and atraumatic.     Mouth/Throat:     Pharynx: No oropharyngeal exudate.  Eyes:      General: No scleral icterus.    Conjunctiva/sclera: Conjunctivae normal.  Cardiovascular:     Rate and Rhythm: Normal rate and regular rhythm.     Pulses: Normal pulses.     Heart sounds: Normal heart sounds.  Pulmonary:     Effort: Pulmonary effort is normal. No respiratory distress.     Breath sounds: Normal breath sounds. No wheezing.  Chest:     Chest wall: Tenderness present.     Comments: Sternal chest wall tenderness to palpation Abdominal:     General: Bowel sounds are normal.     Palpations: Abdomen is soft. There is no mass.     Tenderness: There is no abdominal tenderness. There is no guarding or rebound.     Comments: No abdominal tenderness to palpation  Musculoskeletal:        General: Normal range of motion.     Cervical back: Normal range of motion and neck supple.  Skin:    General: Skin is warm and dry.  Neurological:     Mental Status: She is alert.  Psychiatric:        Behavior: Behavior normal.     ED Results / Procedures / Treatments   Labs (all labs ordered are listed, but only abnormal results are displayed) Labs Reviewed  COMPREHENSIVE METABOLIC PANEL - Abnormal; Notable for the following components:      Result Value   Glucose, Bld 127 (*)    Total Protein 8.4 (*)    All other components within normal limits  CBC - Abnormal; Notable for the following components:   Hemoglobin 11.7 (*)    HCT 35.5 (*)    All other components within normal limits  LIPASE, BLOOD    EKG None  Radiology No results found.  Procedures Procedures    Medications Ordered in ED Medications - No data to display  ED Course/ Medical Decision Making/ A&P                             Medical Decision Making Amount and/or Complexity of Data Reviewed Labs: ordered.  Risk OTC drugs. Prescription drug management.   Pt presents with concerns for nausea onset today.  Has a history of GERD and has been compliant with her omeprazole and Carafate.  Follow-up  appointment with her GI specialist end of March 2024.  Patient afebrile.  On exam patient with mild sternal chest wall tenderness to palpation.  No abdominal tenderness to palpation.  Differential diagnosis includes GERD, ACS,***.  Co morbidities that complicate the patient evaluation: GERD, gastritis, hypertension    Labs:  I ordered, and personally interpreted labs.  The pertinent results include:   Lipase *** CBC *** CMP *** Initial troponin *** delta troponin ***  Imaging: I ordered imaging studies including {sabimaging:28099} I independently visualized and interpreted imaging which  showed: *** I agree with the radiologist interpretation  Medications:  I ordered medication including pepcid, [name] for symptom management Reevaluation of the patient after these medicines and interventions, I reevaluated the patient and found that they have {resolved/improved/worsened:23923::"improved"} I have reviewed the patients home medicines and have made adjustments as needed   {Cardiac Monitoring: The patient was maintained on a cardiac monitor.  I personally viewed and interpreted the cardiac monitored which showed an underlying rhythm of: ***.   Test Considered: ***   Critical Interventions ***}   {Consultations: I requested consultation with the {sabspecialists:27145}, and discussed lab and imaging findings as well as pertinent plan - they recommend: ***}  Social Determinants of Health: ***   Disposition: {End of MDM here with the likely diagnosis}. After consideration of the diagnostic results and the patients response to treatment, I feel that the patient would benefit from {sabdispo:27146}. Supportive care measures and strict return precautions discussed with patient at bedside. Pt acknowledges and verbalizes understanding. Pt appears safe for discharge. Follow up as indicated in discharge paperwork.    This chart was dictated using voice recognition software, Dragon.  Despite the best efforts of this provider to proofread and correct errors, errors may still occur which can change documentation meaning.  Final Clinical Impression(s) / ED Diagnoses Final diagnoses:  None    Rx / DC Orders ED Discharge Orders     None

## 2022-11-30 NOTE — Telephone Encounter (Signed)
..   Medicaid Managed Care   Unsuccessful Outreach Note  11/30/2022 Name: Tamara Church MRN: [id number] DOB: Jan 20, 1976  Referred by: Ezequiel Essex, MD Reason for referral : Appointment (I called the patient today to get her missed phone appt with the [facility] RNCM rescheduled. I left my name and number on her VM.)   A second unsuccessful telephone outreach was attempted today. The patient was referred to the case management team for assistance with care management and care coordination.   Follow Up Plan: A HIPAA compliant phone message was left for the patient providing contact information and requesting a return call.  The care management team will reach out to the patient again over the next 7 days.   [place]

## 2022-11-30 NOTE — ED Notes (Signed)
Reviewed AVS/discharge instruction with patient. Time allotted for and all questions answered. Patient is agreeable for d/c and escorted to ed exit by staff.  

## 2022-12-02 ENCOUNTER — Encounter (HOSPITAL_BASED_OUTPATIENT_CLINIC_OR_DEPARTMENT_OTHER): Payer: Self-pay | Admitting: Emergency Medicine

## 2022-12-02 ENCOUNTER — Emergency Department (HOSPITAL_BASED_OUTPATIENT_CLINIC_OR_DEPARTMENT_OTHER)
Admission: EM | Admit: 2022-12-02 | Discharge: 2022-12-02 | Disposition: A | Discharge status: Home / Self Care | Payer: Medicaid Other | Attending: Emergency Medicine | Admitting: Emergency Medicine

## 2022-12-02 ENCOUNTER — Other Ambulatory Visit: Payer: Self-pay

## 2022-12-02 DIAGNOSIS — R112 Nausea with vomiting, unspecified: Secondary | ICD-10-CM | POA: Insufficient documentation

## 2022-12-02 DIAGNOSIS — Z79899 Other long term (current) drug therapy: Secondary | ICD-10-CM | POA: Insufficient documentation

## 2022-12-02 DIAGNOSIS — R1010 Upper abdominal pain, unspecified: Secondary | ICD-10-CM | POA: Insufficient documentation

## 2022-12-02 DIAGNOSIS — I10 Essential (primary) hypertension: Secondary | ICD-10-CM | POA: Diagnosis not present

## 2022-12-02 LAB — COMPREHENSIVE METABOLIC PANEL
ALT: 17 U/L (ref 0–44)
AST: 15 U/L (ref 15–41)
Albumin: 4.6 g/dL (ref 3.5–5.0)
Alkaline Phosphatase: 54 U/L (ref 38–126)
Anion gap: 11 (ref 5–15)
BUN: 11 mg/dL (ref 6–20)
CO2: 24 mmol/L (ref 22–32)
Calcium: 10.2 mg/dL (ref 8.9–10.3)
Chloride: 102 mmol/L (ref 98–111)
Creatinine, Ser: 0.77 mg/dL (ref 0.44–1.00)
GFR, Estimated: >60 mL/min (ref >60)
Glucose, Bld: 115 mg/dL — ABNORMAL HIGH (ref 70–99)
Potassium: 3.8 mmol/L (ref 3.5–5.1)
Sodium: 137 mmol/L (ref 135–145)
Total Bilirubin: 0.5 mg/dL (ref 0.3–1.2)
Total Protein: 8.8 g/dL — ABNORMAL HIGH (ref 6.5–8.1)

## 2022-12-02 LAB — CBC
HCT: 37.2 % (ref 36.0–46.0)
Hemoglobin: 12.2 g/dL (ref 12.0–15.0)
MCH: 29.2 pg (ref 26.0–34.0)
MCHC: 32.8 g/dL (ref 30.0–36.0)
MCV: 89 fL (ref 80.0–100.0)
Platelets: 288 10*3/uL (ref 150–400)
RBC: 4.18 MIL/uL (ref 3.87–5.11)
RDW: 14.1 % (ref 11.5–15.5)
WBC: 8.5 10*3/uL (ref 4.0–10.5)
nRBC: 0 % (ref 0.0–0.2)

## 2022-12-02 LAB — URINALYSIS, ROUTINE W REFLEX MICROSCOPIC
Bilirubin Urine: NEGATIVE
Glucose, UA: NEGATIVE mg/dL
Hgb urine dipstick: NEGATIVE
Ketones, ur: NEGATIVE mg/dL
Leukocytes,Ua: NEGATIVE
Nitrite: NEGATIVE
Protein, ur: NEGATIVE mg/dL
Specific Gravity, Urine: <1.005 — ABNORMAL LOW (ref 1.005–1.030)
pH: 6 (ref 5.0–8.0)

## 2022-12-02 LAB — PREGNANCY, URINE: Preg Test, Ur: NEGATIVE

## 2022-12-02 LAB — LIPASE, BLOOD: Lipase: 14 U/L (ref 11–51)

## 2022-12-02 MED ORDER — PROCHLORPERAZINE MALEATE 10 MG PO TABS: 10.0000 mg | ORAL_TABLET | Freq: Two times a day (BID) | ORAL | 0 refills | Status: DC | PRN

## 2022-12-02 MED ORDER — PROCHLORPERAZINE EDISYLATE 10 MG/2ML IJ SOLN
5.0000 mg | Freq: Once | INTRAMUSCULAR | Status: AC
  Administered 2022-12-02: 5 mg via INTRAVENOUS
  Filled 2022-12-02: qty 2

## 2022-12-02 MED ORDER — SODIUM CHLORIDE 0.9 % IV BOLUS
1000.0000 mL | Freq: Once | INTRAVENOUS | Status: AC
  Administered 2022-12-02: 1000 mL via INTRAVENOUS

## 2022-12-02 NOTE — ED Provider Notes (Signed)
[facility] Provider Note   CSN: [id number] Arrival date & time: 12/02/22  1042     History {Add pertinent medical, surgical, social history, OB history to HPI:1} Chief Complaint  Patient presents with   Nausea    Tamara Church is a 47 y.o. female.  HPI Patient presents with nausea vomit abdominal pain.  Upper abdominal pain.  History of same.  Had had frequent abdominal pains but had been doing better since a cholecystectomy almost 2 months ago.  However the last few days has had nausea vomiting.  No diarrhea.  States feels somewhat like before.  No definite fevers or chills.  No sick contacts.  Decreased appetite.  States Zofran worked yesterday but not today.   Past Medical History:  Diagnosis Date   Abdominal pain, epigastric 07/27/2020   Anemia    ANEMIA 09/07/2008   Qualifier: Diagnosis of  By: Doreene Nest MD, Kim     Bacterial vaginosis 05/08/2020   Calculus of gallbladder without cholecystitis without obstruction 07/05/2021   Constipation 05/18/2020   Early satiety 05/18/2020   Gastritis and gastroduodenitis 07/27/2020   GERD (gastroesophageal reflux disease)    Hypertension    Non-intractable vomiting 05/18/2020   Palpitations 03/22/2020   Prolonged QT interval 10/04/2020   Screening-pulmonary TB 10/08/2021   Sore throat 09/18/2020   Unintentional weight loss 05/18/2020   UTI (urinary tract infection) 04/23/2020    Home Medications Prior to Admission medications   Medication Sig Start Date End Date Taking? Authorizing Provider  alum & mag hydroxide-simeth (MAALOX MULTI SYMPTOM MAX ST) 400-400-40 MG/5ML suspension Take 15 mLs by mouth every 6 (six) hours as needed for indigestion. Patient not taking: Reported on 10/26/2022 08/05/22   Tretha Sciara, MD  amLODipine (NORVASC) 10 MG tablet Take 1 tablet (10 mg total) by mouth daily. 07/31/22   Ezequiel Essex, MD  amoxicillin-clavulanate (AUGMENTIN) 875-125 MG tablet Take 1 tablet by  mouth every 12 (twelve) hours. Patient not taking: Reported on 10/26/2022 09/21/22   Prosperi, Christian H, PA-C  chlorhexidine (PERIDEX) 0.12 % solution Use as directed 15 mLs in the mouth or throat 2 (two) times daily. Patient not taking: Reported on 10/26/2022 08/19/22   Carmin Muskrat, MD  dicyclomine (BENTYL) 20 MG tablet Take 1 tablet (20 mg total) by mouth 2 (two) times daily. Patient not taking: Reported on 09/22/2022 08/04/22   Willia Craze, NP  famotidine (PEPCID) 20 MG tablet Take 1 tablet (20 mg total) by mouth 2 (two) times daily. 11/30/22   Blue, Soijett A, PA-C  levonorgestrel (MIRENA) 20 MCG/24HR IUD 1 each by Intrauterine route once.    [provider]  metoCLOPramide (REGLAN) 10 MG tablet Take 1 tablet (10 mg total) by mouth every 6 (six) hours. Patient not taking: Reported on 08/29/2022 08/05/22   Tretha Sciara, MD  ondansetron (ZOFRAN) 4 MG tablet Take 1 tablet (4 mg total) by mouth every 8 (eight) hours as needed for nausea or vomiting. 11/30/22   Blue, Soijett A, PA-C  promethazine (PHENERGAN) 25 MG suppository Place 1 suppository (25 mg total) rectally every 6 (six) hours as needed for nausea or vomiting. Patient not taking: Reported on 08/29/2022 08/07/22   Margarita Mail, PA-C  RABEprazole (ACIPHEX) 20 MG tablet Take 1 tablet (20 mg total) by mouth 2 (two) times daily. 11/19/22   Mansouraty, Telford Nab., MD  spironolactone (ALDACTONE) 25 MG tablet Take 2 tablets (50 mg total) by mouth at bedtime. 09/17/22   Ezequiel Essex, MD  sucralfate (CARAFATE) 1 g tablet Take 1 tablet (1 g total) by mouth 2 (two) times daily. 11/30/22   Blue, Soijett A, PA-C      Allergies    Ace inhibitors, Angiotensin receptor blockers, and Hctz [hydrochlorothiazide]    Review of Systems   Review of Systems  Physical Exam Updated Vital Signs BP (!) 155/96 (BP Location: Right Arm)   Pulse (!) 106   Temp 98.4 F (36.9 C)   Resp 18   Ht '5\' 1"'$  (1.549 m)   Wt 90.7 kg   LMP  11/18/2022 (Approximate)   SpO2 99%   BMI 37.79 kg/m  Physical Exam Vitals and nursing note reviewed.  Eyes:     Pupils: Pupils are equal, round, and reactive to light.  Cardiovascular:     Rate and Rhythm: Regular rhythm.  Pulmonary:     Breath sounds: No wheezing.  Abdominal:     Tenderness: There is abdominal tenderness.     Comments: Mild upper abdominal tenderness without rebound or guarding.  No hernia palpated.  Musculoskeletal:        General: No tenderness.     Cervical back: Neck supple.  Skin:    General: Skin is warm.  Neurological:     Mental Status: She is alert and oriented to person, place, and time.     ED Results / Procedures / Treatments   Labs (all labs ordered are listed, but only abnormal results are displayed) Labs Reviewed  CBC  LIPASE, BLOOD  COMPREHENSIVE METABOLIC PANEL  URINALYSIS, ROUTINE W REFLEX MICROSCOPIC  PREGNANCY, URINE    EKG None  Radiology No results found.  Procedures Procedures  {Document cardiac monitor, telemetry assessment procedure when appropriate:1}  Medications Ordered in ED Medications  sodium chloride 0.9 % bolus 1,000 mL (1,000 mLs Intravenous New Bag/Given 12/02/22 1126)  prochlorperazine (COMPAZINE) injection 5 mg (5 mg Intravenous Given 12/02/22 1126)    ED Course/ Medical Decision Making/ A&P   {   Click here for ABCD2, HEART and other calculatorsREFRESH Note before signing :1}                          Medical Decision Making Amount and/or Complexity of Data Reviewed Labs: ordered.  Risk Prescription drug management.   Patient with nausea and vomiting.  History of same.  Recently seen for same.  Had had multiple episodes prior to cholecystectomy.  Will get basic blood work and treat symptomatically.  Differential diagnosis includes gastroenteritis, gastritis, nonspecific vomiting.  {Document critical care time when appropriate:1} {Document review of labs and clinical decision tools ie heart score,  Chads2Vasc2 etc:1}  {Document your independent review of radiology images, and any outside records:1} {Document your discussion with family members, caretakers, and with consultants:1} {Document social determinants of health affecting pt's care:1} {Document your decision making why or why not admission, treatments were needed:1} Final Clinical Impression(s) / ED Diagnoses Final diagnoses:  None    Rx / DC Orders ED Discharge Orders     None

## 2022-12-02 NOTE — ED Notes (Signed)
Given  ginger ale  and  crackers

## 2022-12-02 NOTE — ED Notes (Signed)
Pt verbalized understanding of d/c instructions, meds, and followup care. Denies questions. VSS, no distress noted. Steady gait to exit with all belongings.  ?

## 2022-12-02 NOTE — Discharge Instructions (Addendum)
Follow up with your gastroenterologist as planned.

## 2022-12-02 NOTE — ED Triage Notes (Signed)
Pt via pv from home with nausea, heartburn, burping since last night. Pt seen for the same 2 days ago and told her she had acid reflux. Pt burping continually during triage. Pt alert & oriented, nad noted.

## 2022-12-08 ENCOUNTER — Other Ambulatory Visit: Payer: Medicaid Other | Admitting: *Deleted

## 2022-12-08 NOTE — Transitions of Care (Post Inpatient/ED Visit) (Signed)
   12/08/2022  Name: Tamara Church MRN: [id number] DOB: [birth year]  Today's TOC FU Call Status: Today's TOC FU Call Status:: Unsuccessul Call (1st Attempt) Unsuccessful Call (1st Attempt) Date: 12/08/22  Attempted to reach the patient regarding the most recent Inpatient/ED visit.  Follow Up Plan: Additional outreach attempts will be made to reach the patient to complete the Transitions of Care (Post Inpatient/ED visit) call.   Lurena Joiner RN, BSN   [employer] manager

## 2022-12-14 ENCOUNTER — Telehealth: Payer: Self-pay

## 2022-12-14 ENCOUNTER — Other Ambulatory Visit: Payer: Medicaid Other | Admitting: *Deleted

## 2022-12-14 NOTE — Patient Outreach (Signed)
   Embedded Care Coordination  Case Closure Note  12/14/2022 Name: Tamara Church MRN: [id number] DOB: 09-01-1976  Tamara Church is a 47 y.o. year old female who is a primary care patient of Ezequiel Essex, MD. The Embedded Care Coordination team was consulted for assistance with chronic disease management and care coordination needs related to HTN and abdominal pain  We have been unable to make contact with the patient for follow up. The care management team is available to follow up with the patient after provider conversation with the patient regarding recommendation for care management engagement and subsequent re-referral to the care management team [phone]).   If patient returns call to provider office and is in need of assistance from the embedded care coordination team, please advise that the patient call the [facility] at [phone] for assistance.   Lurena Joiner RN, BSN [facility] RN Care Coordinator [phone]

## 2022-12-14 NOTE — Telephone Encounter (Signed)
..   Medicaid Managed Care   Unsuccessful Outreach Note  12/14/2022 Name: Tamara Church MRN: [id number] DOB: [birth year]  Referred by: Ezequiel Essex, MD Reason for referral : Appointment   Third unsuccessful telephone outreach was attempted today. The patient was referred to the case management team for assistance with care management and care coordination. The patient's primary care provider has been notified of our unsuccessful attempts to make or maintain contact with the patient. The care management team is pleased to engage with this patient at any time in the future should he/she be interested in assistance from the care management team.   Follow Up Plan: We have been unable to make contact with the patient for follow up. The care management team is available to follow up with the patient after provider conversation with the patient regarding recommendation for care management engagement and subsequent re-referral to the care management team.   Lind  [phone]

## 2022-12-31 ENCOUNTER — Telehealth: Payer: Self-pay | Admitting: Gastroenterology

## 2022-12-31 ENCOUNTER — Other Ambulatory Visit: Payer: Self-pay | Admitting: Gastroenterology

## 2022-12-31 DIAGNOSIS — R110 Nausea: Secondary | ICD-10-CM

## 2022-12-31 MED ORDER — RABEPRAZOLE SODIUM 20 MG PO TBEC: 20.0000 mg | DELAYED_RELEASE_TABLET | Freq: Two times a day (BID) | ORAL | 6 refills | Status: DC

## 2022-12-31 NOTE — Telephone Encounter (Signed)
Patient is calling states she is out of her Omeprazole Rx and her pharmacy is needing a fax from [place] to be able to refill it. Please advise

## 2022-12-31 NOTE — Telephone Encounter (Signed)
Patient aware rabeprazole Rx was sent to [facility] on [address].

## 2023-01-27 ENCOUNTER — Telehealth: Payer: Self-pay | Admitting: Gastroenterology

## 2023-01-27 DIAGNOSIS — R110 Nausea: Secondary | ICD-10-CM

## 2023-01-27 MED ORDER — SUCRALFATE 1 G PO TABS: 1.0000 g | ORAL_TABLET | Freq: Two times a day (BID) | ORAL | 0 refills | Status: DC

## 2023-01-27 NOTE — Telephone Encounter (Signed)
Patient called stating that she spoke with pharmacist and states that Sucralfate is on back order and does not know when they will get it again. She is wondering if an alternative could be sent in. Please advise, thank you.

## 2023-01-27 NOTE — Telephone Encounter (Signed)
Patient advised that I had contacted [facility] at [facility] and they have sucralfate.  [facility] to [facility].  Patient was scheduled to follow up in our office with Willette Cluster, NP on 04-20-23 at [facility].  Patient agreed to plan and verbalized understanding.  No further questions.

## 2023-01-27 NOTE — Telephone Encounter (Signed)
Patient called stating that she is out of her Sucralfate medication. She is requesting a refill be sent into her pharmacy. She would like a call back once this is done. Please advise, thank you.

## 2023-02-23 ENCOUNTER — Encounter (HOSPITAL_COMMUNITY): Payer: Self-pay

## 2023-02-23 ENCOUNTER — Emergency Department (HOSPITAL_COMMUNITY)
Admission: EM | Admit: 2023-02-23 | Discharge: 2023-02-23 | Disposition: A | Discharge status: Home / Self Care | Payer: Medicaid Other | Attending: Emergency Medicine | Admitting: Emergency Medicine

## 2023-02-23 ENCOUNTER — Other Ambulatory Visit: Payer: Self-pay | Admitting: Gastroenterology

## 2023-02-23 ENCOUNTER — Other Ambulatory Visit: Payer: Self-pay

## 2023-02-23 DIAGNOSIS — H6690 Otitis media, unspecified, unspecified ear: Secondary | ICD-10-CM

## 2023-02-23 DIAGNOSIS — K0889 Other specified disorders of teeth and supporting structures: Secondary | ICD-10-CM

## 2023-02-23 DIAGNOSIS — Z79899 Other long term (current) drug therapy: Secondary | ICD-10-CM | POA: Diagnosis not present

## 2023-02-23 DIAGNOSIS — H6692 Otitis media, unspecified, left ear: Secondary | ICD-10-CM | POA: Insufficient documentation

## 2023-02-23 DIAGNOSIS — I10 Essential (primary) hypertension: Secondary | ICD-10-CM | POA: Diagnosis not present

## 2023-02-23 DIAGNOSIS — R110 Nausea: Secondary | ICD-10-CM

## 2023-02-23 MED ORDER — CHLORHEXIDINE GLUCONATE 0.12 % MT SOLN: 15.0000 mL | Freq: Two times a day (BID) | OROMUCOSAL | 0 refills | Status: DC

## 2023-02-23 MED ORDER — NAPROXEN 500 MG PO TABS: 500.0000 mg | ORAL_TABLET | Freq: Two times a day (BID) | ORAL | 0 refills | Status: DC | PRN

## 2023-02-23 MED ORDER — AMOXICILLIN 500 MG PO CAPS: 500.0000 mg | ORAL_CAPSULE | Freq: Three times a day (TID) | ORAL | 0 refills | Status: DC

## 2023-02-23 MED ORDER — KETOROLAC TROMETHAMINE 15 MG/ML IJ SOLN
15.0000 mg | Freq: Once | INTRAMUSCULAR | Status: AC
  Administered 2023-02-23: 15 mg via INTRAMUSCULAR
  Filled 2023-02-23: qty 1

## 2023-02-23 MED ORDER — ACETAMINOPHEN 325 MG PO TABS
650.0000 mg | ORAL_TABLET | Freq: Once | ORAL | Status: AC
  Administered 2023-02-23: 650 mg via ORAL
  Filled 2023-02-23: qty 2

## 2023-02-23 NOTE — Discharge Instructions (Addendum)
Return to the ED with any new or worsening signs or symptoms Please read the attached guide concerning dental resources in the area to follow-up with Please read the attached guides concerning otitis media and dental pain Please begin taking naproxen twice daily for the next 7 days.  Please also begin taking amoxicillin 3 times daily for the next 7 days for ear infection.  Please utilize chlorhexidine mouthwash prescribed you twice daily.

## 2023-02-23 NOTE — ED Triage Notes (Signed)
PT states her teeth broke off in lower middle and she's having pain radiates into right ear and she can't eat.

## 2023-02-23 NOTE — ED Provider Notes (Signed)
[place] EMERGENCY DEPARTMENT AT [facility] Provider Note   CSN: [id number] Arrival date & time: 02/23/23  1115     History  Chief Complaint  Patient presents with   Dental Pain    Tamara Church is a 47 y.o. female with medical history of anemia, GERD, hypertension.  Patient presents to ED for evaluation of dental pain and what she believes is a broken off tooth.  Patient reports being Sunday night she developed lower middle dental pain that radiates into her left ear.  The patient reports that she has taken no medications prior to arrival.  She had a similar presentation back in November where she reported midline lower dental pain that radiated into her left ear.  At that time, she was discharged with antibiotics, anti-inflammatories and nausea mouthwash.  The patient was set to follow-up with a dentist.  Patient reports that she did follow-up with a dentist who did not believe there was any indication to pull her teeth as they did not seem to be broken off.  The patient is here requesting referral to a second dentist for second opinion.  She denies fevers, nausea or vomiting at home.  She denies recently swimming.  Denies drainage from the ear, trouble hearing.   Dental Pain      Home Medications Prior to Admission medications   Medication Sig Start Date End Date Taking? Authorizing Provider  amoxicillin (AMOXIL) 500 MG capsule Take 1 capsule (500 mg total) by mouth 3 (three) times daily. 02/23/23  Yes Al Decant, PA-C  chlorhexidine (PERIDEX) 0.12 % solution Use as directed 15 mLs in the mouth or throat 2 (two) times daily. 02/23/23  Yes Al Decant, PA-C  naproxen (NAPROSYN) 500 MG tablet Take 1 tablet (500 mg total) by mouth 2 (two) times daily as needed. 02/23/23  Yes Al Decant, PA-C  alum & mag hydroxide-simeth (MAALOX MULTI SYMPTOM MAX ST) 400-400-40 MG/5ML suspension Take 15 mLs by mouth every 6 (six) hours as needed for  indigestion. Patient not taking: Reported on 10/26/2022 08/05/22   Glyn Ade, MD  amLODipine (NORVASC) 10 MG tablet Take 1 tablet (10 mg total) by mouth daily. 07/31/22   Fayette Pho, MD  amoxicillin-clavulanate (AUGMENTIN) 875-125 MG tablet Take 1 tablet by mouth every 12 (twelve) hours. Patient not taking: Reported on 10/26/2022 09/21/22   Prosperi, Christian H, PA-C  dicyclomine (BENTYL) 20 MG tablet Take 1 tablet (20 mg total) by mouth 2 (two) times daily. Patient not taking: Reported on 09/22/2022 08/04/22   Meredith Pel, NP  famotidine (PEPCID) 20 MG tablet Take 1 tablet (20 mg total) by mouth 2 (two) times daily. 11/30/22   Blue, Soijett A, PA-C  levonorgestrel (MIRENA) 20 MCG/24HR IUD 1 each by Intrauterine route once.    [provider]  ondansetron (ZOFRAN) 4 MG tablet Take 1 tablet (4 mg total) by mouth every 8 (eight) hours as needed for nausea or vomiting. 11/30/22   Blue, Soijett A, PA-C  prochlorperazine (COMPAZINE) 10 MG tablet Take 1 tablet (10 mg total) by mouth 2 (two) times daily as needed for nausea or vomiting. 12/02/22   Benjiman Core, MD  RABEprazole (ACIPHEX) 20 MG tablet Take 1 tablet (20 mg total) by mouth 2 (two) times daily. 12/31/22   Mansouraty, Netty Starring., MD  spironolactone (ALDACTONE) 25 MG tablet Take 2 tablets (50 mg total) by mouth at bedtime. 09/17/22   Fayette Pho, MD  sucralfate (CARAFATE) 1 g tablet TAKE 1  TABLET BY MOUTH 2 TIMES DAILY. 02/23/23   Meredith Pel, NP      Allergies    Ace inhibitors, Angiotensin receptor blockers, and Hctz [hydrochlorothiazide]    Review of Systems   Review of Systems  HENT:  Positive for dental problem and ear pain.   All other systems reviewed and are negative.   Physical Exam Updated Vital Signs BP (!) 143/100 (BP Location: Left Arm)   Pulse 79   Temp 98.6 F (37 C) (Oral)   Resp 20   Ht 5\' 1"  (1.549 m)   Wt 90.7 kg   SpO2 99%   BMI 37.78 kg/m  Physical Exam Vitals and nursing  note reviewed.  Constitutional:      General: She is not in acute distress.    Appearance: Normal appearance. She is not ill-appearing, toxic-appearing or diaphoretic.  HENT:     Head: Normocephalic and atraumatic.     Left Ear: A middle ear effusion is present. Tympanic membrane is bulging.     Nose: Nose normal.     Mouth/Throat:     Mouth: Mucous membranes are moist.     Pharynx: Oropharynx is clear.   Eyes:     Extraocular Movements: Extraocular movements intact.     Conjunctiva/sclera: Conjunctivae normal.     Pupils: Pupils are equal, round, and reactive to light.  Cardiovascular:     Rate and Rhythm: Normal rate and regular rhythm.  Pulmonary:     Effort: Pulmonary effort is normal.     Breath sounds: Normal breath sounds. No wheezing.  Abdominal:     General: Abdomen is flat. Bowel sounds are normal.     Palpations: Abdomen is soft.     Tenderness: There is no abdominal tenderness.  Musculoskeletal:     Cervical back: Normal range of motion and neck supple. No tenderness.  Skin:    General: Skin is warm and dry.     Capillary Refill: Capillary refill takes less than 2 seconds.  Neurological:     Mental Status: She is alert and oriented to person, place, and time.     ED Results / Procedures / Treatments   Labs (all labs ordered are listed, but only abnormal results are displayed) Labs Reviewed - No data to display  EKG None  Radiology No results found.  Procedures Procedures   Medications Ordered in ED Medications  ketorolac (TORADOL) 15 MG/ML injection 15 mg (has no administration in time range)  acetaminophen (TYLENOL) tablet 650 mg (has no administration in time range)    ED Course/ Medical Decision Making/ A&P  Medical Decision Making  47 year old female presents to the ED for evaluation of dental and ear pain.  Please see HPI for further details.  On exam the patient has no evidence of broken teeth to her lower middle gumline.  She does have  anterior recession of her gumline however no purulence, no abscess in need of drainage.  Patient oropharynx otherwise unremarkable.  The patient left ear also shows to have bulging tympanic membrane.  Will start patient on antibiotics, NSAIDs, Magic mouthwash and provide her with dental resource guide.  She will follow-up with a dentist in the area.  She has been given return precautions and she is voiced understanding.  She has had all her questions answered to her satisfaction.  She is stable to discharge home.   Final Clinical Impression(s) / ED Diagnoses Final diagnoses:  Toothache  Acute otitis media, unspecified otitis media type  Rx / DC Orders ED Discharge Orders          Ordered    amoxicillin (AMOXIL) 500 MG capsule  3 times daily        02/23/23 1542    chlorhexidine (PERIDEX) 0.12 % solution  2 times daily        02/23/23 1542    naproxen (NAPROSYN) 500 MG tablet  2 times daily PRN        02/23/23 1542              Al Decant, PA-C 02/23/23 1545    Lorre Nick, MD 02/23/23 1623

## 2023-02-24 ENCOUNTER — Telehealth: Payer: Self-pay

## 2023-02-24 NOTE — Transitions of Care (Post Inpatient/ED Visit) (Signed)
   02/24/2023  Name: Tamara Church MRN: [id number] DOB: 12/10/1975  Today's TOC FU Call Status: Today's TOC FU Call Status:: Unsuccessul Call (1st Attempt) Unsuccessful Call (1st Attempt) Date: 02/24/23  Attempted to reach the patient regarding the most recent Inpatient/ED visit.  Follow Up Plan: Additional outreach attempts will be made to reach the patient to complete the Transitions of Care (Post Inpatient/ED visit) call.   Abelino Derrick, MHA [facility]  Managed [facility] Social Worker [phone]

## 2023-03-12 NOTE — Telephone Encounter (Signed)
Patient Advocate Encounter  Prior Authorization for [employer] has been approved with RABEPRAZOLE 20MG .    PA# [id number] Effective dates: 2.1.24 through 1.3.25

## 2023-03-25 ENCOUNTER — Other Ambulatory Visit: Payer: Self-pay | Admitting: Nurse Practitioner

## 2023-03-25 DIAGNOSIS — R110 Nausea: Secondary | ICD-10-CM

## 2023-03-28 ENCOUNTER — Other Ambulatory Visit: Payer: Self-pay

## 2023-03-28 ENCOUNTER — Encounter (HOSPITAL_BASED_OUTPATIENT_CLINIC_OR_DEPARTMENT_OTHER): Payer: Self-pay

## 2023-03-28 ENCOUNTER — Emergency Department (HOSPITAL_BASED_OUTPATIENT_CLINIC_OR_DEPARTMENT_OTHER)
Admission: EM | Admit: 2023-03-28 | Discharge: 2023-03-28 | Disposition: A | Discharge status: Home / Self Care | Payer: Medicaid Other | Attending: Emergency Medicine | Admitting: Emergency Medicine

## 2023-03-28 DIAGNOSIS — L739 Follicular disorder, unspecified: Secondary | ICD-10-CM

## 2023-03-28 DIAGNOSIS — R000 Tachycardia, unspecified: Secondary | ICD-10-CM | POA: Insufficient documentation

## 2023-03-28 DIAGNOSIS — K219 Gastro-esophageal reflux disease without esophagitis: Secondary | ICD-10-CM | POA: Insufficient documentation

## 2023-03-28 DIAGNOSIS — L662 Folliculitis decalvans: Secondary | ICD-10-CM | POA: Insufficient documentation

## 2023-03-28 MED ORDER — LIDOCAINE VISCOUS HCL 2 % MT SOLN
15.0000 mL | Freq: Once | OROMUCOSAL | Status: AC
  Administered 2023-03-28: 15 mL via ORAL
  Filled 2023-03-28: qty 15

## 2023-03-28 MED ORDER — FAMOTIDINE 20 MG PO TABS
20.0000 mg | ORAL_TABLET | Freq: Once | ORAL | Status: AC
  Administered 2023-03-28: 20 mg via ORAL
  Filled 2023-03-28: qty 1

## 2023-03-28 MED ORDER — FAMOTIDINE 20 MG PO TABS: 20.0000 mg | ORAL_TABLET | Freq: Two times a day (BID) | ORAL | 0 refills | Status: DC

## 2023-03-28 MED ORDER — DOXYCYCLINE HYCLATE 100 MG PO TABS
100.0000 mg | ORAL_TABLET | Freq: Once | ORAL | Status: AC
  Administered 2023-03-28: 100 mg via ORAL
  Filled 2023-03-28: qty 1

## 2023-03-28 MED ORDER — ALUM & MAG HYDROXIDE-SIMETH 200-200-20 MG/5ML PO SUSP
30.0000 mL | Freq: Once | ORAL | Status: AC
  Administered 2023-03-28: 30 mL via ORAL
  Filled 2023-03-28: qty 30

## 2023-03-28 MED ORDER — DOXYCYCLINE HYCLATE 100 MG PO CAPS: 100.0000 mg | ORAL_CAPSULE | Freq: Two times a day (BID) | ORAL | 0 refills | Status: DC

## 2023-03-28 NOTE — ED Triage Notes (Signed)
Pt states she ate something that has given her "bad indigestion" Has taken omeprazole, TUMS and Imodium PTA Also reports an abscess on rt. Labia Very small abscess noted

## 2023-03-28 NOTE — Discharge Instructions (Addendum)
Continue warm compress to area on your labia several times a day.  Take next dose of Pepcid and antibiotic tomorrow.

## 2023-03-28 NOTE — ED Provider Notes (Signed)
[place] EMERGENCY DEPARTMENT AT [facility] Provider Note   CSN: [id number] Arrival date & time: 03/28/23  2152     History  Chief Complaint  Patient presents with   Gastroesophageal Reflux    Tamara Church is a 47 y.o. female.  Patient here with acid reflux symptoms.  History of the same.  She is on omeprazole and Tums.  No longer takes Pepcid.  She states that indigestion happened right after eating some chicken off of the grill.  She is not having any exertional symptoms.  No shortness of breath weakness numbness or chills.  She has a history of gastritis and gastroduodenitis.  She has not been having any other discomfort prior to today.  Denies any recent surgery or travel.  She also states that she has noticed a bump on the right side of her labia/vagina.  Denies any fevers or chills.  Nothing makes it worse or better.  She has no history of hypertension or diabetes or high cholesterol.  No cardiac 3.  The history is provided by the patient.       Home Medications Prior to Admission medications   Medication Sig Start Date End Date Taking? Authorizing Provider  doxycycline (VIBRAMYCIN) 100 MG capsule Take 1 capsule (100 mg total) by mouth 2 (two) times daily. 03/28/23  Yes Nikiyah Fackler, DO  famotidine (PEPCID) 20 MG tablet Take 1 tablet (20 mg total) by mouth 2 (two) times daily. 03/28/23  Yes Akiel Fennell, DO  alum & mag hydroxide-simeth (MAALOX MULTI SYMPTOM MAX ST) 400-400-40 MG/5ML suspension Take 15 mLs by mouth every 6 (six) hours as needed for indigestion. Patient not taking: Reported on 10/26/2022 08/05/22   Glyn Ade, MD  amLODipine (NORVASC) 10 MG tablet Take 1 tablet (10 mg total) by mouth daily. 07/31/22   Fayette Pho, MD  amoxicillin (AMOXIL) 500 MG capsule Take 1 capsule (500 mg total) by mouth 3 (three) times daily. 02/23/23   Al Decant, PA-C  amoxicillin-clavulanate (AUGMENTIN) 875-125 MG tablet Take 1 tablet by mouth every 12  (twelve) hours. Patient not taking: Reported on 10/26/2022 09/21/22   Prosperi, Christian H, PA-C  chlorhexidine (PERIDEX) 0.12 % solution Use as directed 15 mLs in the mouth or throat 2 (two) times daily. 02/23/23   Al Decant, PA-C  dicyclomine (BENTYL) 20 MG tablet Take 1 tablet (20 mg total) by mouth 2 (two) times daily. Patient not taking: Reported on 09/22/2022 08/04/22   Meredith Pel, NP  levonorgestrel [facility]) 20 MCG/24HR IUD 1 each by Intrauterine route once.    [provider]  naproxen (NAPROSYN) 500 MG tablet Take 1 tablet (500 mg total) by mouth 2 (two) times daily as needed. 02/23/23   Al Decant, PA-C  ondansetron (ZOFRAN) 4 MG tablet Take 1 tablet (4 mg total) by mouth every 8 (eight) hours as needed for nausea or vomiting. 11/30/22   Blue, Soijett A, PA-C  prochlorperazine (COMPAZINE) 10 MG tablet Take 1 tablet (10 mg total) by mouth 2 (two) times daily as needed for nausea or vomiting. 12/02/22   Benjiman Core, MD  RABEprazole (ACIPHEX) 20 MG tablet Take 1 tablet (20 mg total) by mouth 2 (two) times daily. 12/31/22   Mansouraty, Netty Starring., MD  spironolactone (ALDACTONE) 25 MG tablet Take 2 tablets (50 mg total) by mouth at bedtime. 09/17/22   Fayette Pho, MD  sucralfate (CARAFATE) 1 g tablet TAKE 1 TABLET BY MOUTH TWICE A DAY 03/26/23   Meredith Pel,  NP      Allergies    Ace inhibitors, Angiotensin receptor blockers, and Hctz [hydrochlorothiazide]    Review of Systems   Review of Systems  Physical Exam Updated Vital Signs BP (!) 132/97 (BP Location: Right Arm)   Pulse 76   Temp 99 F (37.2 C) (Oral)   Resp 20   Ht 5\' 2"  (1.575 m)   Wt 90.7 kg   SpO2 94%   BMI 36.58 kg/m  Physical Exam Vitals and nursing note reviewed. Exam conducted with a chaperone present.  Constitutional:      General: She is not in acute distress.    Appearance: She is well-developed. She is not ill-appearing.  HENT:     Head: Normocephalic and atraumatic.      Nose: Nose normal.     Mouth/Throat:     Mouth: Mucous membranes are moist.  Eyes:     Extraocular Movements: Extraocular movements intact.     Conjunctiva/sclera: Conjunctivae normal.     Pupils: Pupils are equal, round, and reactive to light.  Cardiovascular:     Rate and Rhythm: Normal rate and regular rhythm.     Pulses: Normal pulses.     Heart sounds: Normal heart sounds. No murmur heard. Pulmonary:     Effort: Pulmonary effort is normal. No respiratory distress.     Breath sounds: Normal breath sounds.  Abdominal:     General: Abdomen is flat.     Palpations: Abdomen is soft.     Tenderness: There is no abdominal tenderness.  Genitourinary:    Comments: She has a little bit of may be inflamed hair follicle in the right labia but I do not think there is any abscess, there is no fluctuance or drainage Musculoskeletal:        General: No swelling.     Cervical back: Normal range of motion and neck supple.  Skin:    General: Skin is warm and dry.     Capillary Refill: Capillary refill takes less than 2 seconds.  Neurological:     General: No focal deficit present.     Mental Status: She is alert and oriented to person, place, and time.     Cranial Nerves: No cranial nerve deficit.     Sensory: No sensory deficit.     Motor: No weakness.     Coordination: Coordination normal.  Psychiatric:        Mood and Affect: Mood normal.     ED Results / Procedures / Treatments   Labs (all labs ordered are listed, but only abnormal results are displayed) Labs Reviewed - No data to display  EKG EKG Interpretation Date/Time:  Sunday March 28 2023 22:18:16 EDT Ventricular Rate:  105 PR Interval:  156 QRS Duration:  94 QT Interval:  328 QTC Calculation: 434 R Axis:   15  Text Interpretation: Sinus tachycardia Confirmed by Virgina Norfolk [phone]) on 03/28/2023 10:27:39 PM  Radiology No results found.  Procedures Procedures    Medications Ordered in ED Medications   doxycycline (VIBRA-TABS) tablet 100 mg (100 mg Oral Given 03/28/23 2217)  famotidine (PEPCID) tablet 20 mg (20 mg Oral Given 03/28/23 2217)  alum & mag hydroxide-simeth (MAALOX/MYLANTA) 200-200-20 MG/5ML suspension 30 mL (30 mLs Oral Given 03/28/23 2217)    And  lidocaine (XYLOCAINE) 2 % viscous mouth solution 15 mL (15 mLs Oral Given 03/28/23 2217)    ED Course/ Medical Decision Making/ A&P  Medical Decision Making Risk OTC drugs. Prescription drug management.   [name] is here with acid reflux.  Normal vitals.  No fever.  History of acid reflux and gastritis.  She has no cardiac risk factors.  Her story is atypical for ACS.  She has a heart score of 0.  EKG shows sinus rhythm.  No ischemic changes.  She had resolution of symptoms with Pepcid and [name] with viscous lidocaine and Maalox.  She has clear breath sounds.  Have no concern for pneumonia or pneumothorax.  He has no PE risk factors.  She is also concerned about some irritation to the right labial area that looks like may be a irritated hair follicle.  I have no concern for abscess.  Recommend warm compresses.  Will be conservative and put her on some doxycycline.  Ultimately patient discharged in good condition.  Will add Pepcid back to her chronic reflux regimen where she already takes Carafate and omeprazole.  She understands return precautions.  Discharged in good condition.  This chart was dictated using voice recognition software.  Despite best efforts to proofread,  errors can occur which can change the documentation meaning.         Final Clinical Impression(s) / ED Diagnoses Final diagnoses:  Folliculitis  Gastroesophageal reflux disease, unspecified whether esophagitis present    Rx / DC Orders ED Discharge Orders          Ordered    famotidine (PEPCID) 20 MG tablet  2 times daily        03/28/23 2228    doxycycline (VIBRAMYCIN) 100 MG capsule  2 times daily        03/28/23  2228              Virgina Norfolk, DO 03/28/23 2228

## 2023-04-02 ENCOUNTER — Other Ambulatory Visit: Payer: Self-pay

## 2023-04-02 ENCOUNTER — Encounter (HOSPITAL_BASED_OUTPATIENT_CLINIC_OR_DEPARTMENT_OTHER): Payer: Self-pay | Admitting: Emergency Medicine

## 2023-04-02 ENCOUNTER — Emergency Department (HOSPITAL_BASED_OUTPATIENT_CLINIC_OR_DEPARTMENT_OTHER): Payer: Medicaid Other | Admitting: Radiology

## 2023-04-02 ENCOUNTER — Emergency Department (HOSPITAL_BASED_OUTPATIENT_CLINIC_OR_DEPARTMENT_OTHER)
Admission: EM | Admit: 2023-04-02 | Discharge: 2023-04-02 | Disposition: A | Discharge status: Home / Self Care | Payer: Medicaid Other | Attending: Emergency Medicine | Admitting: Emergency Medicine

## 2023-04-02 DIAGNOSIS — R1013 Epigastric pain: Secondary | ICD-10-CM

## 2023-04-02 DIAGNOSIS — R142 Eructation: Secondary | ICD-10-CM | POA: Diagnosis not present

## 2023-04-02 DIAGNOSIS — R079 Chest pain, unspecified: Secondary | ICD-10-CM | POA: Diagnosis not present

## 2023-04-02 DIAGNOSIS — K219 Gastro-esophageal reflux disease without esophagitis: Secondary | ICD-10-CM | POA: Diagnosis not present

## 2023-04-02 DIAGNOSIS — Q765 Cervical rib: Secondary | ICD-10-CM | POA: Diagnosis not present

## 2023-04-02 LAB — COMPREHENSIVE METABOLIC PANEL
ALT: 54 U/L — ABNORMAL HIGH (ref 0–44)
AST: 15 U/L (ref 15–41)
Albumin: 4.5 g/dL (ref 3.5–5.0)
Alkaline Phosphatase: 57 U/L (ref 38–126)
Anion gap: 9 (ref 5–15)
BUN: 11 mg/dL (ref 6–20)
CO2: 27 mmol/L (ref 22–32)
Calcium: 10 mg/dL (ref 8.9–10.3)
Chloride: 103 mmol/L (ref 98–111)
Creatinine, Ser: 0.89 mg/dL (ref 0.44–1.00)
GFR, Estimated: >60 mL/min (ref >60)
Glucose, Bld: 151 mg/dL — ABNORMAL HIGH (ref 70–99)
Potassium: 3.8 mmol/L (ref 3.5–5.1)
Sodium: 139 mmol/L (ref 135–145)
Total Bilirubin: 0.3 mg/dL (ref 0.3–1.2)
Total Protein: 8.3 g/dL — ABNORMAL HIGH (ref 6.5–8.1)

## 2023-04-02 LAB — CBC
HCT: 37.6 % (ref 36.0–46.0)
Hemoglobin: 12.2 g/dL (ref 12.0–15.0)
MCH: 28.9 pg (ref 26.0–34.0)
MCHC: 32.4 g/dL (ref 30.0–36.0)
MCV: 89.1 fL (ref 80.0–100.0)
Platelets: 285 10*3/uL (ref 150–400)
RBC: 4.22 MIL/uL (ref 3.87–5.11)
RDW: 13.2 % (ref 11.5–15.5)
WBC: 9.2 10*3/uL (ref 4.0–10.5)
nRBC: 0 % (ref 0.0–0.2)

## 2023-04-02 LAB — TROPONIN I (HIGH SENSITIVITY): Troponin I (High Sensitivity): 3 ng/L (ref <18)

## 2023-04-02 LAB — PREGNANCY, URINE: Preg Test, Ur: NEGATIVE

## 2023-04-02 MED ORDER — METOCLOPRAMIDE HCL 10 MG PO TABS
10.0000 mg | ORAL_TABLET | Freq: Once | ORAL | Status: AC
  Administered 2023-04-02: 10 mg via ORAL
  Filled 2023-04-02: qty 1

## 2023-04-02 MED ORDER — NITROGLYCERIN 0.4 MG SL SUBL
0.4000 mg | SUBLINGUAL_TABLET | Freq: Once | SUBLINGUAL | Status: AC
  Administered 2023-04-02: 0.4 mg via SUBLINGUAL
  Filled 2023-04-02: qty 1

## 2023-04-02 MED ORDER — FAMOTIDINE 20 MG PO TABS
40.0000 mg | ORAL_TABLET | Freq: Once | ORAL | Status: AC
  Administered 2023-04-02: 40 mg via ORAL
  Filled 2023-04-02: qty 2

## 2023-04-02 MED ORDER — ALUM & MAG HYDROXIDE-SIMETH 200-200-20 MG/5ML PO SUSP
30.0000 mL | Freq: Once | ORAL | Status: AC
  Administered 2023-04-02: 30 mL via ORAL
  Filled 2023-04-02: qty 30

## 2023-04-02 MED ORDER — LIDOCAINE VISCOUS HCL 2 % MT SOLN
15.0000 mL | Freq: Once | OROMUCOSAL | Status: AC
  Administered 2023-04-02: 15 mL via OROMUCOSAL
  Filled 2023-04-02: qty 15

## 2023-04-02 NOTE — ED Notes (Signed)
RN reviewed discharge instructions with pt. Pt verbalized understanding and had no further questions. VSS upon discharge.  

## 2023-04-02 NOTE — Discharge Instructions (Signed)
Return for worsening or persistent symptoms, inability to eat or drink.   Try to avoid things that may make this worse, most commonly these are spicy foods tomato based products fatty foods chocolate and peppermint.  Alcohol and tobacco can also make this worse.  Return to the emergency department for sudden worsening pain fever or inability to eat or drink.

## 2023-04-02 NOTE — ED Provider Notes (Signed)
[facility] EMERGENCY DEPARTMENT AT [facility] Provider Note   CSN: [id number] Arrival date & time: 04/02/23  1948     History  Chief Complaint  Patient presents with   Gastroesophageal Reflux    Tamara Church is a 47 y.o. female.  47 yo F with a cc of reflux.  She tells me that this is how she usually presents with it.  She had something with ranch dressing and she thinks that it was bad and that is caused her to have worsening symptoms.  She feels full and has had frequent belching.  She said last time she was here she got a [name] and it helped significantly.  She is here to get a another dose of that.  She denies exertional symptoms.  She is currently on omeprazole.  Has follow-up with gastroenterology.   Gastroesophageal Reflux       Home Medications Prior to Admission medications   Medication Sig Start Date End Date Taking? Authorizing Provider  alum & mag hydroxide-simeth (MAALOX MULTI SYMPTOM MAX ST) 400-400-40 MG/5ML suspension Take 15 mLs by mouth every 6 (six) hours as needed for indigestion. Patient not taking: Reported on 10/26/2022 08/05/22   Glyn Ade, MD  amLODipine (NORVASC) 10 MG tablet Take 1 tablet (10 mg total) by mouth daily. 07/31/22   Fayette Pho, MD  amoxicillin (AMOXIL) 500 MG capsule Take 1 capsule (500 mg total) by mouth 3 (three) times daily. 02/23/23   Al Decant, PA-C  amoxicillin-clavulanate (AUGMENTIN) 875-125 MG tablet Take 1 tablet by mouth every 12 (twelve) hours. Patient not taking: Reported on 10/26/2022 09/21/22   Prosperi, Christian H, PA-C  chlorhexidine (PERIDEX) 0.12 % solution Use as directed 15 mLs in the mouth or throat 2 (two) times daily. 02/23/23   Al Decant, PA-C  dicyclomine (BENTYL) 20 MG tablet Take 1 tablet (20 mg total) by mouth 2 (two) times daily. Patient not taking: Reported on 09/22/2022 08/04/22   Meredith Pel, NP  doxycycline (VIBRAMYCIN) 100 MG capsule Take 1 capsule (100 mg  total) by mouth 2 (two) times daily. 03/28/23   Curatolo, Adam, DO  famotidine (PEPCID) 20 MG tablet Take 1 tablet (20 mg total) by mouth 2 (two) times daily. 03/28/23   Virgina Norfolk, DO  levonorgestrel (MIRENA) 20 MCG/24HR IUD 1 each by Intrauterine route once.    [provider]  naproxen (NAPROSYN) 500 MG tablet Take 1 tablet (500 mg total) by mouth 2 (two) times daily as needed. 02/23/23   Al Decant, PA-C  ondansetron (ZOFRAN) 4 MG tablet Take 1 tablet (4 mg total) by mouth every 8 (eight) hours as needed for nausea or vomiting. 11/30/22   Blue, Soijett A, PA-C  prochlorperazine (COMPAZINE) 10 MG tablet Take 1 tablet (10 mg total) by mouth 2 (two) times daily as needed for nausea or vomiting. 12/02/22   Benjiman Core, MD  RABEprazole (ACIPHEX) 20 MG tablet Take 1 tablet (20 mg total) by mouth 2 (two) times daily. 12/31/22   Mansouraty, Netty Starring., MD  spironolactone (ALDACTONE) 25 MG tablet Take 2 tablets (50 mg total) by mouth at bedtime. 09/17/22   Fayette Pho, MD  sucralfate (CARAFATE) 1 g tablet TAKE 1 TABLET BY MOUTH TWICE A DAY 03/26/23   Meredith Pel, NP      Allergies    Ace inhibitors, Angiotensin receptor blockers, and Hctz [hydrochlorothiazide]    Review of Systems   Review of Systems  Physical Exam Updated Vital Signs BP Marland Kitchen)  160/97   Pulse 99   Temp 98.3 F (36.8 C) (Oral)   Resp 18   Ht 5\' 2"  (1.575 m)   Wt 91 kg   SpO2 100%   BMI 36.69 kg/m  Physical Exam Vitals and nursing note reviewed.  Constitutional:      General: She is not in acute distress.    Appearance: She is well-developed. She is not diaphoretic.  HENT:     Head: Normocephalic and atraumatic.  Eyes:     Pupils: Pupils are equal, round, and reactive to light.  Cardiovascular:     Rate and Rhythm: Normal rate and regular rhythm.     Heart sounds: No murmur heard.    No friction rub. No gallop.  Pulmonary:     Effort: Pulmonary effort is normal.     Breath sounds: No  wheezing or rales.  Abdominal:     General: There is no distension.     Palpations: Abdomen is soft.     Tenderness: There is no abdominal tenderness.  Musculoskeletal:        General: No tenderness.     Cervical back: Normal range of motion and neck supple.  Skin:    General: Skin is warm and dry.  Neurological:     Mental Status: She is alert and oriented to person, place, and time.  Psychiatric:        Behavior: Behavior normal.     ED Results / Procedures / Treatments   Labs (all labs ordered are listed, but only abnormal results are displayed) Labs Reviewed  COMPREHENSIVE METABOLIC PANEL - Abnormal; Notable for the following components:      Result Value   Glucose, Bld 151 (*)    Total Protein 8.3 (*)    ALT 54 (*)    All other components within normal limits  CBC  PREGNANCY, URINE  TROPONIN I (HIGH SENSITIVITY)    EKG EKG Interpretation Date/Time:  Friday April 02 2023 20:02:19 EDT Ventricular Rate:  98 PR Interval:  158 QRS Duration:  88 QT Interval:  360 QTC Calculation: 459 R Axis:   30  Text Interpretation: Normal sinus rhythm Cannot rule out Anterior infarct , age undetermined Abnormal ECG No significant change since last tracing Confirmed by Melene Plan [id number]) on 04/02/2023 9:06:17 PM  Radiology DG Chest 2 View  Result Date: 04/02/2023 CLINICAL DATA:  Chest pain, acid reflux, and belching starting today. EXAM: CHEST - 2 VIEW COMPARISON:  09/12/2022 FINDINGS: Shallow inspiration. Heart size and pulmonary vascularity are normal. Lungs are clear. No pleural effusions. No pneumothorax. Mediastinal contours appear intact. Mild degenerative changes in the spine. Right cervical rib. IMPRESSION: No active cardiopulmonary disease. Electronically Signed   By: Burman Nieves M.D.   On: 04/02/2023 20:27    Procedures Procedures    Medications Ordered in ED Medications  nitroGLYCERIN (NITROSTAT) SL tablet 0.4 mg (has no administration in time range)  famotidine  (PEPCID) tablet 40 mg (40 mg Oral Given 04/02/23 2132)  alum & mag hydroxide-simeth (MAALOX/MYLANTA) 200-200-20 MG/5ML suspension 30 mL (30 mLs Oral Given 04/02/23 2135)  lidocaine (XYLOCAINE) 2 % viscous mouth solution 15 mL (15 mLs Mouth/Throat Given 04/02/23 2135)  metoCLOPramide (REGLAN) tablet 10 mg (10 mg Oral Given 04/02/23 2222)    ED Course/ Medical Decision Making/ A&P                             Medical Decision Making Amount and/or  Complexity of Data Reviewed Labs: ordered. Radiology: ordered.  Risk OTC drugs. Prescription drug management.   47 yo F with a chief complaints of frequent belching and epigastric fullness.  Patient has been seen multiple times for this and typically gets diagnosed with reflux.  Troponins negative chest x-ray independently interpreted by me without focal infiltrate pneumothorax no anemia no significant electrolyte abnormalities.  Will give a [name].  Reviewed the patient's medical record and last visit she got Pepcid and Maalox and lidocaine.  Patient tells me that she still uncomfortable and like more medications.  Given a dose of Reglan.  I was notified again by nursing that the patient was still a bit uncomfortable.  Will give dose of nitro.  Interestingly I found that the patient had shown up with her son at the same time.  Both are here for epigastric discomfort.  Patient is feeling better on repeat assessment.  Will discharge home.  PCP follow-up.  11:07 PM:  I have discussed the diagnosis/risks/treatment options with the patient.  Evaluation and diagnostic testing in the emergency department does not suggest an emergent condition requiring admission or immediate intervention beyond what has been performed at this time.  They will follow up with GI. We also discussed returning to the ED immediately if new or worsening sx occur. We discussed the sx which are most concerning (e.g., sudden worsening pain, fever, inability to tolerate by mouth)  that necessitate immediate return. Medications administered to the patient during their visit and any new prescriptions provided to the patient are listed below.  Medications given during this visit Medications  nitroGLYCERIN (NITROSTAT) SL tablet 0.4 mg (has no administration in time range)  famotidine (PEPCID) tablet 40 mg (40 mg Oral Given 04/02/23 2132)  alum & mag hydroxide-simeth (MAALOX/MYLANTA) 200-200-20 MG/5ML suspension 30 mL (30 mLs Oral Given 04/02/23 2135)  lidocaine (XYLOCAINE) 2 % viscous mouth solution 15 mL (15 mLs Mouth/Throat Given 04/02/23 2135)  metoCLOPramide (REGLAN) tablet 10 mg (10 mg Oral Given 04/02/23 2222)     The patient appears reasonably screen and/or stabilized for discharge and I doubt any other medical condition or other [facility] requiring further screening, evaluation, or treatment in the ED at this time prior to discharge.          Final Clinical Impression(s) / ED Diagnoses Final diagnoses:  Epigastric pain    Rx / DC Orders ED Discharge Orders     None         Melene Plan, DO 04/02/23 2307

## 2023-04-02 NOTE — ED Triage Notes (Addendum)
Patient reports acid reflux, chest pain, and belching that started today.  Patient belching in triage.

## 2023-04-05 ENCOUNTER — Telehealth: Payer: Self-pay

## 2023-04-05 NOTE — Telephone Encounter (Signed)
Mansouraty, Netty Starring., MD  Melene Plan, DO; Loretha Stapler, RN DO, Thanks for sending me this. Will send her some [name] prescription to have at home until she can be seen in follow-up.  Jarett Dralle or covering RN, This patient needs a [name] with Bentyl added that she can use 2-3 times daily as needed. She may have 1 bottle with 6 refills. Thanks. GM

## 2023-04-06 ENCOUNTER — Other Ambulatory Visit: Payer: Self-pay

## 2023-04-06 ENCOUNTER — Telehealth: Payer: Self-pay | Admitting: *Deleted

## 2023-04-06 MED ORDER — LIDOCAINE VISCOUS HCL 2 % MT SOLN: 30.0000 mL | Freq: Three times a day (TID) | OROMUCOSAL | 1 refills | Status: DC | PRN

## 2023-04-06 NOTE — Transitions of Care (Post Inpatient/ED Visit) (Signed)
   04/06/2023  Name: Tamara Church MRN: [id number] DOB: Jun 02, 1976  Today's TOC FU Call Status: Today's TOC FU Call Status:: Unsuccessul Call (1st Attempt) Unsuccessful Call (1st Attempt) Date: 04/06/23  Attempted to reach the patient regarding the most recent Inpatient/ED visit.  Follow Up Plan: Additional outreach attempts will be made to reach the patient to complete the Transitions of Care (Post Inpatient/ED visit) call.   Estanislado Emms RN, BSN [place]  Managed [facility] RN Care Coordinator [phone]

## 2023-04-06 NOTE — Telephone Encounter (Signed)
Prescription has been sent to the pharmacy as ordered.

## 2023-04-06 NOTE — Telephone Encounter (Signed)
Prescription has been sent to the pharmacy by fax.

## 2023-04-07 ENCOUNTER — Other Ambulatory Visit: Payer: Self-pay

## 2023-04-07 ENCOUNTER — Encounter (HOSPITAL_BASED_OUTPATIENT_CLINIC_OR_DEPARTMENT_OTHER): Payer: Self-pay

## 2023-04-07 ENCOUNTER — Emergency Department (HOSPITAL_BASED_OUTPATIENT_CLINIC_OR_DEPARTMENT_OTHER)
Admission: EM | Admit: 2023-04-07 | Discharge: 2023-04-07 | Disposition: A | Discharge status: Home / Self Care | Payer: Medicaid Other | Attending: Emergency Medicine | Admitting: Emergency Medicine

## 2023-04-07 DIAGNOSIS — R1013 Epigastric pain: Secondary | ICD-10-CM | POA: Insufficient documentation

## 2023-04-07 MED ORDER — METOCLOPRAMIDE HCL 10 MG PO TABS: 10.0000 mg | ORAL_TABLET | Freq: Four times a day (QID) | ORAL | 0 refills | Status: DC | PRN

## 2023-04-07 MED ORDER — DICYCLOMINE HCL 10 MG PO CAPS
10.0000 mg | ORAL_CAPSULE | Freq: Once | ORAL | Status: AC
  Administered 2023-04-07: 10 mg via ORAL
  Filled 2023-04-07: qty 1

## 2023-04-07 MED ORDER — ALUM & MAG HYDROXIDE-SIMETH 200-200-20 MG/5ML PO SUSP
30.0000 mL | Freq: Once | ORAL | Status: AC
  Administered 2023-04-07: 30 mL via ORAL
  Filled 2023-04-07: qty 30

## 2023-04-07 MED ORDER — METOCLOPRAMIDE HCL 10 MG PO TABS
10.0000 mg | ORAL_TABLET | Freq: Once | ORAL | Status: AC
  Administered 2023-04-07: 10 mg via ORAL
  Filled 2023-04-07: qty 1

## 2023-04-07 MED ORDER — FAMOTIDINE 20 MG PO TABS
20.0000 mg | ORAL_TABLET | Freq: Once | ORAL | Status: AC
  Administered 2023-04-07: 20 mg via ORAL
  Filled 2023-04-07: qty 1

## 2023-04-07 NOTE — ED Triage Notes (Signed)
POV from home, A&O x 4, GCS 15, amb to room  Pt sts that last night after eating she laid down for bed, belching, throat fullness, nausea began after that. Sts that she tried zofran and compazine without relief. Also takes two meds for acid reflux, third visit for same and has appt with GI this week. Belching continuously in triage.

## 2023-04-07 NOTE — ED Provider Notes (Signed)
[place] EMERGENCY DEPARTMENT AT [facility] Provider Note   CSN: [id number] Arrival date & time: 04/07/23  4098     History  Chief Complaint  Patient presents with   Nausea   Gastroesophageal Reflux    Tamara Church is a 47 y.o. female.  47 year old female with history of gastritis who presents the ER today secondary to belching and nausea with epigastric pain similar to previous episodes.  She has a GI appointment scheduled for Thursday.  She states that she had a little bit is worse with the bed she took some of her medications and seem to get better but that she woke up this morning and it was back again.  No vomiting.  No diarrhea or constipation.  No fevers.  No other associated symptoms.  She states she had tuna and a salad around 9:00 and went to bed around 10.  Denies alcohol, drugs or tobacco.  No anti-inflammatories.  Patient states that she is not on any acid blockers of any type.   Gastroesophageal Reflux       Home Medications Prior to Admission medications   Medication Sig Start Date End Date Taking? Authorizing Provider  metoCLOPramide (REGLAN) 10 MG tablet Take 1 tablet (10 mg total) by mouth every 6 (six) hours as needed for nausea. 04/07/23  Yes Daemian Gahm, Barbara Cower, MD  amLODipine (NORVASC) 10 MG tablet Take 1 tablet (10 mg total) by mouth daily. 07/31/22   Valetta Close, MD  chlorhexidine (PERIDEX) 0.12 % solution Use as directed 15 mLs in the mouth or throat 2 (two) times daily. 02/23/23   Al Decant, PA-C  famotidine (PEPCID) 20 MG tablet Take 1 tablet (20 mg total) by mouth 2 (two) times daily. 03/28/23   Curatolo, Adam, DO  [name] (alum & mag hydroxide, lidocaine, dicyclomine) oral mixture Take 30 mLs by mouth 3 (three) times daily as needed. Swish and swallow 90 ml viscous lidocaine 90 ml 10 mg/5 ml dicyclomine  270 ml maalox 04/06/23   Mansouraty, Netty Starring., MD  levonorgestrel (MIRENA) 20 MCG/24HR IUD 1 each by Intrauterine route  once.    [provider]  ondansetron (ZOFRAN) 4 MG tablet Take 1 tablet (4 mg total) by mouth every 8 (eight) hours as needed for nausea or vomiting. 11/30/22   Blue, Soijett A, PA-C  RABEprazole (ACIPHEX) 20 MG tablet Take 1 tablet (20 mg total) by mouth 2 (two) times daily. 12/31/22   Mansouraty, Netty Starring., MD  spironolactone (ALDACTONE) 25 MG tablet Take 2 tablets (50 mg total) by mouth at bedtime. 09/17/22   Valetta Close, MD  sucralfate (CARAFATE) 1 g tablet TAKE 1 TABLET BY MOUTH TWICE A DAY 03/26/23   Meredith Pel, NP      Allergies    Ace inhibitors, Angiotensin receptor blockers, and Hctz [hydrochlorothiazide]    Review of Systems   Review of Systems  Physical Exam Updated Vital Signs BP 133/84   Pulse 88   Temp 98.3 F (36.8 C) (Oral)   Resp 18   Ht 5\' 2"  (1.575 m)   Wt 91 kg   SpO2 94%   BMI 36.69 kg/m  Physical Exam Vitals and nursing note reviewed.     ED Results / Procedures / Treatments   Labs (all labs ordered are listed, but only abnormal results are displayed) Labs Reviewed - No data to display  EKG None  Radiology No results found.  Procedures Procedures    Medications Ordered in ED Medications  alum & mag hydroxide-simeth (MAALOX/MYLANTA) 200-200-20 MG/5ML suspension 30 mL (30 mLs Oral Given 04/07/23 0635)  dicyclomine (BENTYL) capsule 10 mg (10 mg Oral Given 04/07/23 8295)  famotidine (PEPCID) tablet 20 mg (20 mg Oral Given 04/07/23 0633)  metoCLOPramide (REGLAN) tablet 10 mg (10 mg Oral Given 04/07/23 6213)    ED Course/ Medical Decision Making/ A&P                             Medical Decision Making Risk OTC drugs. Prescription drug management.  No indication for labs, likely gastritis/PUD, already has appropriate follow up in a couple days, will treat symptomatically and reevaluate for disposition.  Symptoms improved for a long period of time patient feel better.  She has follow-up already.  Stable for  discharge.  Final Clinical Impression(s) / ED Diagnoses Final diagnoses:  Epigastric pain    Rx / DC Orders ED Discharge Orders          Ordered    metoCLOPramide (REGLAN) 10 MG tablet  Every 6 hours PRN        04/07/23 0715              Shauntell Iglesia, Barbara Cower, MD 04/09/23 2312

## 2023-04-12 ENCOUNTER — Ambulatory Visit: Payer: Medicaid Other | Admitting: Family Medicine

## 2023-04-12 ENCOUNTER — Encounter: Payer: Self-pay | Admitting: Family Medicine

## 2023-04-12 VITALS — BP 147/97 | HR 90 | Ht 62.0 in | Wt 201.4 lb

## 2023-04-12 DIAGNOSIS — H6690 Otitis media, unspecified, unspecified ear: Secondary | ICD-10-CM | POA: Insufficient documentation

## 2023-04-12 DIAGNOSIS — I10 Essential (primary) hypertension: Secondary | ICD-10-CM | POA: Diagnosis not present

## 2023-04-12 DIAGNOSIS — Z0000 Encounter for general adult medical examination without abnormal findings: Secondary | ICD-10-CM | POA: Diagnosis not present

## 2023-04-12 DIAGNOSIS — H66002 Acute suppurative otitis media without spontaneous rupture of ear drum, left ear: Secondary | ICD-10-CM

## 2023-04-12 MED ORDER — AMOXICILLIN 875 MG PO TABS
875.0000 mg | ORAL_TABLET | Freq: Two times a day (BID) | ORAL | 0 refills | Status: AC
Start: 2023-04-12 — End: 2023-04-17

## 2023-04-12 NOTE — Assessment & Plan Note (Signed)
-  Advised patient to f/u for Pap with PCP -Due for colonoscopy, already follows with GI. Advised to return to care to discuss colon cancer screening

## 2023-04-12 NOTE — Assessment & Plan Note (Signed)
Exam consistent with L AOM (bulging only of inferior TM but diffusely erythematous). Patient opted for abx treatment. Requests not to be given Augmentin due to h/o yeast infections when used. -Amoxicillin 875mg  BID x 5 days

## 2023-04-12 NOTE — Assessment & Plan Note (Signed)
147/97 upon repeat. Possibly in the setting of acute pain. Compliant on Amlodipine 10mg .  -Advised patient to f/u in 2 weeks for BP check

## 2023-04-12 NOTE — Patient Instructions (Addendum)
It was wonderful to see you today! Thank you for choosing [facility].   Please bring ALL of your medications with you to every visit.   Today we talked about:  Please return to GI to discuss colonoscopy for colon cancer screening. You are due for pap smear to screen for cervical cancer. Please schedule at the front to come back and have it done I sent in an antibiotic for your ear infection. Please take it twice per day for the next 5 days. You can use Tylenol for pain. Honey is best treatment for sore throat.  Please follow up for pap smear  If you haven't already, sign up for My Chart to have easy access to your labs results, and communication with your primary care physician.  Call the clinic at [phone] if your symptoms worsen or you have any concerns.  Please be sure to schedule follow up at the front desk before you leave today.   Elberta Fortis, DO Family Medicine

## 2023-04-12 NOTE — Progress Notes (Signed)
    SUBJECTIVE:   CHIEF COMPLAINT / HPI:   L ear pain Started 3 days ago. Developed sore throat this morning. Took some Tylenol for pain and it helped. Denies fever, cough or SOB. 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   PERTINENT  PMH / PSH: Anemia, GERD, HTN  OBJECTIVE:   BP (!) 147/97   Pulse 90   Ht 5\' 2"  (1.575 m)   Wt 201 lb 6.4 oz (91.4 kg)   SpO2 100%   BMI 36.84 kg/m    General: Well-appearing. Alert. NAD HEENT: Normocephalic. White sclera. Erythematous and bulging in inferior L TM. R TM clear. No rhinorrhea noted. Erythematous pharynx. CV: RRR without murmur Pulm: CTAB. Normal WOB on RA. No wheezing Abdomen: Soft, non-tender, non-distended. +BS Ext: Well perfused. Cap refill < 3 seconds Skin: Warm, dry. No rashes noted  ASSESSMENT/PLAN:   AOM (acute otitis media) Exam consistent with L AOM (bulging only of inferior TM but diffusely erythematous). Patient opted for abx treatment. Requests not to be given Augmentin due to h/o yeast infections when used. -Amoxicillin 875mg  BID x 5 days  HYPERTENSION, BENIGN SYSTEMIC 147/97 upon repeat. Possibly in the setting of acute pain. Compliant on Amlodipine 10mg .  -Advised patient to f/u in 2 weeks for BP check  Healthcare maintenance -Advised patient to f/u for Pap with PCP -Due for colonoscopy, already follows with GI. Advised to return to care to discuss colon cancer screening   Dr. Elberta Fortis, DO [facility]

## 2023-04-14 ENCOUNTER — Emergency Department (HOSPITAL_BASED_OUTPATIENT_CLINIC_OR_DEPARTMENT_OTHER)
Admission: EM | Admit: 2023-04-14 | Discharge: 2023-04-15 | Disposition: A | Discharge status: Home / Self Care | Payer: Medicaid Other | Attending: Emergency Medicine | Admitting: Emergency Medicine

## 2023-04-14 ENCOUNTER — Other Ambulatory Visit: Payer: Self-pay

## 2023-04-14 DIAGNOSIS — I129 Hypertensive chronic kidney disease with stage 1 through stage 4 chronic kidney disease, or unspecified chronic kidney disease: Secondary | ICD-10-CM | POA: Insufficient documentation

## 2023-04-14 DIAGNOSIS — Z79899 Other long term (current) drug therapy: Secondary | ICD-10-CM | POA: Diagnosis not present

## 2023-04-14 DIAGNOSIS — N182 Chronic kidney disease, stage 2 (mild): Secondary | ICD-10-CM | POA: Diagnosis not present

## 2023-04-14 DIAGNOSIS — R1013 Epigastric pain: Secondary | ICD-10-CM | POA: Diagnosis not present

## 2023-04-14 LAB — URINALYSIS, ROUTINE W REFLEX MICROSCOPIC
Bacteria, UA: NONE SEEN
Bilirubin Urine: NEGATIVE
Glucose, UA: NEGATIVE mg/dL
Ketones, ur: 40 mg/dL — AB
Nitrite: NEGATIVE
Specific Gravity, Urine: 1.014 (ref 1.005–1.030)
pH: 5.5 (ref 5.0–8.0)

## 2023-04-14 LAB — CBC WITH DIFFERENTIAL/PLATELET
Abs Immature Granulocytes: 0.02 10*3/uL (ref 0.00–0.07)
Basophils Absolute: 0 10*3/uL (ref 0.0–0.1)
Basophils Relative: 1 %
Eosinophils Absolute: 0 10*3/uL (ref 0.0–0.5)
Eosinophils Relative: 0 %
HCT: 36.9 % (ref 36.0–46.0)
Hemoglobin: 12.2 g/dL (ref 12.0–15.0)
Immature Granulocytes: 0 %
Lymphocytes Relative: 15 %
Lymphs Abs: 1 10*3/uL (ref 0.7–4.0)
MCH: 29.2 pg (ref 26.0–34.0)
MCHC: 33.1 g/dL (ref 30.0–36.0)
MCV: 88.3 fL (ref 80.0–100.0)
Monocytes Absolute: 0.4 10*3/uL (ref 0.1–1.0)
Monocytes Relative: 6 %
Neutro Abs: 5.2 10*3/uL (ref 1.7–7.7)
Neutrophils Relative %: 78 %
Platelets: 269 10*3/uL (ref 150–400)
RBC: 4.18 MIL/uL (ref 3.87–5.11)
RDW: 13.2 % (ref 11.5–15.5)
WBC: 6.7 10*3/uL (ref 4.0–10.5)
nRBC: 0 % (ref 0.0–0.2)

## 2023-04-14 LAB — PREGNANCY, URINE: Preg Test, Ur: NEGATIVE

## 2023-04-14 LAB — COMPREHENSIVE METABOLIC PANEL
ALT: 12 U/L (ref 0–44)
AST: 13 U/L — ABNORMAL LOW (ref 15–41)
Albumin: 4.6 g/dL (ref 3.5–5.0)
Alkaline Phosphatase: 48 U/L (ref 38–126)
Anion gap: 12 (ref 5–15)
BUN: 9 mg/dL (ref 6–20)
CO2: 24 mmol/L (ref 22–32)
Calcium: 10.1 mg/dL (ref 8.9–10.3)
Chloride: 101 mmol/L (ref 98–111)
Creatinine, Ser: 0.91 mg/dL (ref 0.44–1.00)
GFR, Estimated: >60 mL/min (ref >60)
Glucose, Bld: 110 mg/dL — ABNORMAL HIGH (ref 70–99)
Potassium: 3.6 mmol/L (ref 3.5–5.1)
Sodium: 137 mmol/L (ref 135–145)
Total Bilirubin: 0.5 mg/dL (ref 0.3–1.2)
Total Protein: 8.7 g/dL — ABNORMAL HIGH (ref 6.5–8.1)

## 2023-04-14 LAB — LIPASE, BLOOD: Lipase: 17 U/L (ref 11–51)

## 2023-04-14 MED ORDER — FAMOTIDINE IN NACL 20-0.9 MG/50ML-% IV SOLN
20.0000 mg | Freq: Once | INTRAVENOUS | Status: AC
  Administered 2023-04-14: 20 mg via INTRAVENOUS
  Filled 2023-04-14: qty 50

## 2023-04-14 MED ORDER — SODIUM CHLORIDE 0.9 % IV BOLUS
500.0000 mL | Freq: Once | INTRAVENOUS | Status: AC
  Administered 2023-04-14: 500 mL via INTRAVENOUS

## 2023-04-14 MED ORDER — ALUM & MAG HYDROXIDE-SIMETH 200-200-20 MG/5ML PO SUSP
30.0000 mL | Freq: Once | ORAL | Status: AC
  Administered 2023-04-14: 30 mL via ORAL
  Filled 2023-04-14: qty 30

## 2023-04-14 MED ORDER — LIDOCAINE VISCOUS HCL 2 % MT SOLN
15.0000 mL | Freq: Once | OROMUCOSAL | Status: AC
  Administered 2023-04-14: 15 mL via ORAL
  Filled 2023-04-14: qty 15

## 2023-04-14 MED ORDER — DICYCLOMINE HCL 20 MG PO TABS: 20.0000 mg | ORAL_TABLET | Freq: Two times a day (BID) | ORAL | 0 refills | Status: DC

## 2023-04-14 MED ORDER — SUCRALFATE 1 G PO TABS: 1.0000 g | ORAL_TABLET | Freq: Three times a day (TID) | ORAL | 0 refills | Status: DC

## 2023-04-14 NOTE — ED Provider Notes (Signed)
EMERGENCY DEPARTMENT AT [facility] Provider Note  CSN: [id number] Arrival date & time: 04/14/23 1926  Chief Complaint(s) Gastroesophageal Reflux  HPI Tamara Church is a 47 y.o. female with past medical history as below, significant for GERD, HTN, cholecystectomy who presents to the ED with complaint of epigastric pain, indigestion.  Onset symptoms today after eating a hotdog from [employer].  Burning epigastric discomfort.  Nausea without vomiting.  No fevers or chills.  No change in bowel or bladder function, no melena or BRBPR.  No relief with home antacid medication.  No chest pain or dyspnea.  Similar symptoms in the past that seem indigestion.  Follows with Dr. Meridee Score  Past Medical History Past Medical History:  Diagnosis Date   Abdominal pain, epigastric 07/27/2020   Anemia    ANEMIA 09/07/2008   Qualifier: Diagnosis of  By: Earnest Bailey MD, Kim     Bacterial vaginosis 05/08/2020   Calculus of gallbladder without cholecystitis without obstruction 07/05/2021   Constipation 05/18/2020   Early satiety 05/18/2020   Gastritis and gastroduodenitis 07/27/2020   GERD (gastroesophageal reflux disease)    Hypertension    Non-intractable vomiting 05/18/2020   Palpitations 03/22/2020   Prolonged QT interval 10/04/2020   Screening-pulmonary TB 10/08/2021   Sore throat 09/18/2020   Unintentional weight loss 05/18/2020   UTI (urinary tract infection) 04/23/2020   Patient Active Problem List   Diagnosis Date Noted   AOM (acute otitis media) 04/12/2023   Right arm pain 10/22/2021   Prediabetes 10/08/2021   Colon cancer screening 07/05/2021   Adjustment disorder with depressed mood 03/15/2020   Seasonal allergies 02/08/2020   Decreased hearing 12/15/2019   Healthcare maintenance 07/14/2019   CKD (chronic kidney disease) stage 2, GFR 60-89 ml/min 03/21/2012   GERD 09/04/2008   OBESITY, NOS 11/18/2006   HYPERTENSION, BENIGN SYSTEMIC 11/18/2006   Home Medication(s) Prior to  Admission medications   Medication Sig Start Date End Date Taking? Authorizing Provider  dicyclomine (BENTYL) 20 MG tablet Take 1 tablet (20 mg total) by mouth 2 (two) times daily for 7 days. 04/14/23 04/21/23 Yes Tanda Rockers A, DO  sucralfate (CARAFATE) 1 g tablet Take 1 tablet (1 g total) by mouth with breakfast, with lunch, and with evening meal for 7 days. 04/14/23 04/21/23 Yes Tanda Rockers A, DO  amLODipine (NORVASC) 10 MG tablet Take 1 tablet (10 mg total) by mouth daily. 07/31/22   Valetta Close, MD  amoxicillin (AMOXIL) 875 MG tablet Take 1 tablet (875 mg total) by mouth 2 (two) times daily for 5 days. 04/12/23 04/17/23  Elberta Fortis, MD  chlorhexidine (PERIDEX) 0.12 % solution Use as directed 15 mLs in the mouth or throat 2 (two) times daily. 02/23/23   Al Decant, PA-C  famotidine (PEPCID) 20 MG tablet Take 1 tablet (20 mg total) by mouth 2 (two) times daily. 03/28/23   Curatolo, Adam, DO  [name] (alum & mag hydroxide, lidocaine, dicyclomine) oral mixture Take 30 mLs by mouth 3 (three) times daily as needed. Swish and swallow 90 ml viscous lidocaine 90 ml 10 mg/5 ml dicyclomine  270 ml maalox 04/06/23   Mansouraty, Netty Starring., MD  levonorgestrel (MIRENA) 20 MCG/24HR IUD 1 each by Intrauterine route once.    [provider]  metoCLOPramide (REGLAN) 10 MG tablet Take 1 tablet (10 mg total) by mouth every 6 (six) hours as needed for nausea. 04/07/23   Mesner, Barbara Cower, MD  ondansetron (ZOFRAN) 4 MG tablet Take 1 tablet (4 mg total)  by mouth every 8 (eight) hours as needed for nausea or vomiting. 11/30/22   Blue, Soijett A, PA-C  RABEprazole (ACIPHEX) 20 MG tablet Take 1 tablet (20 mg total) by mouth 2 (two) times daily. 12/31/22   Mansouraty, Netty Starring., MD  spironolactone (ALDACTONE) 25 MG tablet Take 2 tablets (50 mg total) by mouth at bedtime. 09/17/22   Valetta Close, MD  sucralfate (CARAFATE) 1 g tablet TAKE 1 TABLET BY MOUTH TWICE A DAY 03/26/23   Meredith Pel, NP                                                                                                                                     Past Surgical History Past Surgical History:  Procedure Laterality Date   NO PAST SURGERIES     UPPER GASTROINTESTINAL ENDOSCOPY     Family History Family History  Problem Relation Age of Onset   Hypertension Mother    Colon cancer Neg Hx    Esophageal cancer Neg Hx    Rectal cancer Neg Hx    Stomach cancer Neg Hx    Inflammatory bowel disease Neg Hx    Liver disease Neg Hx    Pancreatic cancer Neg Hx     Social History Social History   Tobacco Use   Smoking status: Never   Smokeless tobacco: Never  Vaping Use   Vaping status: Never Used  Substance Use Topics   Alcohol use: No   Drug use: No   Allergies Ace inhibitors, Angiotensin receptor blockers, and Hctz [hydrochlorothiazide]  Review of Systems Review of Systems  Constitutional:  Negative for chills and fever.  Respiratory:  Negative for chest tightness and shortness of breath.   Cardiovascular:  Negative for chest pain and palpitations.  Gastrointestinal:  Positive for abdominal pain and nausea. Negative for constipation, diarrhea and vomiting.  Skin:  Negative for rash and wound.  Neurological:  Negative for headaches.  All other systems reviewed and are negative.   Physical Exam Vital Signs  I have reviewed the triage vital signs BP (!) 160/97   Pulse 100   Temp 97.9 F (36.6 C)   Resp 18   SpO2 97%  Physical Exam Vitals and nursing note reviewed.  Constitutional:      General: She is not in acute distress.    Appearance: Normal appearance. She is well-developed. She is not ill-appearing.  HENT:     Head: Normocephalic and atraumatic.     Right Ear: External ear normal.     Left Ear: External ear normal.     Nose: Nose normal.     Mouth/Throat:     Mouth: Mucous membranes are moist.  Eyes:     General: No scleral icterus.       Right eye: No discharge.         Left eye: No discharge.  Cardiovascular:     Rate and Rhythm:  Normal rate.  Pulmonary:     Effort: Pulmonary effort is normal. No respiratory distress.     Breath sounds: No stridor.  Abdominal:     General: Abdomen is flat. There is no distension.     Palpations: Abdomen is soft.     Tenderness: There is abdominal tenderness. There is no guarding. Negative signs include Murphy's sign and McBurney's sign.       Comments: Minimal TTP epigastrium, nonperitoneal abdomen  Musculoskeletal:        General: No deformity.     Cervical back: No rigidity.  Skin:    General: Skin is warm and dry.     Coloration: Skin is not cyanotic, jaundiced or pale.  Neurological:     Mental Status: She is alert and oriented to person, place, and time.     GCS: GCS eye subscore is 4. GCS verbal subscore is 5. GCS motor subscore is 6.  Psychiatric:        Speech: Speech normal.        Behavior: Behavior normal. Behavior is cooperative.     ED Results and Treatments Labs (all labs ordered are listed, but only abnormal results are displayed) Labs Reviewed  COMPREHENSIVE METABOLIC PANEL - Abnormal; Notable for the following components:      Result Value   Glucose, Bld 110 (*)    Total Protein 8.7 (*)    AST 13 (*)    All other components within normal limits  URINALYSIS, ROUTINE W REFLEX MICROSCOPIC - Abnormal; Notable for the following components:   Hgb urine dipstick MODERATE (*)    Ketones, ur 40 (*)    Protein, ur TRACE (*)    Leukocytes,Ua MODERATE (*)    All other components within normal limits  CBC WITH DIFFERENTIAL/PLATELET  LIPASE, BLOOD  PREGNANCY, URINE                                                                                                                          Radiology No results found.  Pertinent labs & imaging results that were available during my care of the patient were reviewed by me and considered in my medical decision making (see MDM for  details).  Medications Ordered in ED Medications  alum & mag hydroxide-simeth (MAALOX/MYLANTA) 200-200-20 MG/5ML suspension 30 mL (30 mLs Oral Given 04/14/23 2303)    And  lidocaine (XYLOCAINE) 2 % viscous mouth solution 15 mL (15 mLs Oral Given 04/14/23 2303)  famotidine (PEPCID) IVPB 20 mg premix (0 mg Intravenous Stopped 04/14/23 2341)  sodium chloride 0.9 % bolus 500 mL (500 mLs Intravenous New Bag/Given 04/14/23 2308)  Procedures Procedures  (including critical care time)  Medical Decision Making / ED Course    Medical Decision Making:    RAIA AMICO is a 47 y.o. female with past medical history as below, significant for GERD, HTN, cholecystectomy who presents to the ED with complaint of epigastric pain, indigestion.. The complaint involves an extensive differential diagnosis and also carries with it a high risk of complications and morbidity.  Serious etiology was considered. Ddx includes but is not limited to: Differential diagnosis includes but is not exclusive to acute cholecystitis, intrathoracic causes for epigastric abdominal pain, gastritis, duodenitis, pancreatitis, small bowel or large bowel obstruction, abdominal aortic aneurysm, hernia, gastritis, etc.   Complete initial physical exam performed, notably the patient  was no acute distress, HDS, nonperitoneal abdomen.    Reviewed and confirmed nursing documentation for past medical history, family history, social history.  Vital signs reviewed.      She presents with epigastric discomfort, burning following eating hot dog. Labs stable Urinalysis with sterile pyuria, no urinary complaints, no lower abdominal discomfort.  Renal function stable Symptoms resolved with [name] Recommend she avoid greasy food in the future, bland diet.  Add Carafate to home medications.  Also add Bentyl.   Follow-up with GI was encouraged  The patient improved significantly and was discharged in stable condition. Detailed discussions were had with the patient regarding current findings, and need for close f/u with PCP or on call doctor. The patient has been instructed to return immediately if the symptoms worsen in any way for re-evaluation. Patient verbalized understanding and is in agreement with current care plan. All questions answered prior to discharge.     Additional history obtained: -Additional history obtained from na -External records from outside source obtained and reviewed including: Chart review including previous notes, labs, imaging, consultation notes including home meds, prior labs/imaging, gi documentation   Lab Tests: -I ordered, reviewed, and interpreted labs.   The pertinent results include:   Labs Reviewed  COMPREHENSIVE METABOLIC PANEL - Abnormal; Notable for the following components:      Result Value   Glucose, Bld 110 (*)    Total Protein 8.7 (*)    AST 13 (*)    All other components within normal limits  URINALYSIS, ROUTINE W REFLEX MICROSCOPIC - Abnormal; Notable for the following components:   Hgb urine dipstick MODERATE (*)    Ketones, ur 40 (*)    Protein, ur TRACE (*)    Leukocytes,Ua MODERATE (*)    All other components within normal limits  CBC WITH DIFFERENTIAL/PLATELET  LIPASE, BLOOD  PREGNANCY, URINE    Notable for as above, s table  EKG   EKG Interpretation Date/Time:    Ventricular Rate:    PR Interval:    QRS Duration:    QT Interval:    QTC Calculation:   R Axis:      Text Interpretation:           Imaging Studies ordered: na   Medicines ordered and prescription drug management: Meds ordered this encounter  Medications   AND Linked Order Group    alum & mag hydroxide-simeth (MAALOX/MYLANTA) 200-200-20 MG/5ML suspension 30 mL    lidocaine (XYLOCAINE) 2 % viscous mouth solution 15 mL   famotidine (PEPCID) IVPB 20 mg  premix   sodium chloride 0.9 % bolus 500 mL   sucralfate (CARAFATE) 1 g tablet    Sig: Take 1 tablet (1 g total) by mouth with breakfast, with lunch, and with evening  meal for 7 days.    Dispense:  21 tablet    Refill:  0   dicyclomine (BENTYL) 20 MG tablet    Sig: Take 1 tablet (20 mg total) by mouth 2 (two) times daily for 7 days.    Dispense:  14 tablet    Refill:  0    -I have reviewed the patients home medicines and have made adjustments as needed   Consultations Obtained: na   Cardiac Monitoring: Continuous pulse oximetry trained 98 to 99% room air, interpreted by myself  Social Determinants of Health:  Diagnosis or treatment significantly limited by social determinants of health: na   Reevaluation: After the interventions noted above, I reevaluated the patient and found that they have resolved  Co morbidities that complicate the patient evaluation  Past Medical History:  Diagnosis Date   Abdominal pain, epigastric 07/27/2020   Anemia    ANEMIA 09/07/2008   Qualifier: Diagnosis of  By: Earnest Bailey MD, Kim     Bacterial vaginosis 05/08/2020   Calculus of gallbladder without cholecystitis without obstruction 07/05/2021   Constipation 05/18/2020   Early satiety 05/18/2020   Gastritis and gastroduodenitis 07/27/2020   GERD (gastroesophageal reflux disease)    Hypertension    Non-intractable vomiting 05/18/2020   Palpitations 03/22/2020   Prolonged QT interval 10/04/2020   Screening-pulmonary TB 10/08/2021   Sore throat 09/18/2020   Unintentional weight loss 05/18/2020   UTI (urinary tract infection) 04/23/2020      Dispostion: Disposition decision including need for hospitalization was considered, and patient discharged from emergency department.    Final Clinical Impression(s) / ED Diagnoses Final diagnoses:  Epigastric pain     This chart was dictated using voice recognition software.  Despite best efforts to proofread,  errors can occur which can change the  documentation meaning.    Sloan Leiter, DO 04/14/23 2356

## 2023-04-14 NOTE — ED Triage Notes (Signed)
Pt seen recently for GERD/gastritis. Takes two acid reflux meds. Started on reglan last week. Complains of belching and nausea still. Has tried zofran and compazine in past without relief as well.

## 2023-04-14 NOTE — ED Notes (Signed)
Pt. Denies any pain, but is belching and states she feels like she needs to vomit

## 2023-04-14 NOTE — Discharge Instructions (Addendum)
It was a pleasure caring for you today in the emergency department.  Please return to the emergency department for any worsening or worrisome symptoms.  Please follow up with gastroenterologist  You can increase your carafate to 3 times daily before meals for the next week   Recommend bland diet as noted in handout

## 2023-04-16 ENCOUNTER — Telehealth: Payer: Self-pay

## 2023-04-16 NOTE — Transitions of Care (Post Inpatient/ED Visit) (Signed)
   04/16/2023  Name: DEZAREY HANNIGAN MRN: [id number] DOB: [birth year]  Today's TOC FU Call Status: Today's TOC FU Call Status:: Unsuccessul Call (1st Attempt) Unsuccessful Call (1st Attempt) Date: 04/16/23  Attempted to reach the patient regarding the most recent Inpatient/ED visit.  Follow Up Plan: Additional outreach attempts will be made to reach the patient to complete the Transitions of Care (Post Inpatient/ED visit) call.   Abelino Derrick, MHA [facility]  Managed [facility] Social Worker [phone]

## 2023-04-19 ENCOUNTER — Encounter (HOSPITAL_COMMUNITY): Payer: Self-pay | Admitting: Emergency Medicine

## 2023-04-19 ENCOUNTER — Ambulatory Visit (HOSPITAL_COMMUNITY)
Admission: EM | Admit: 2023-04-19 | Discharge: 2023-04-19 | Disposition: A | Discharge status: Home / Self Care | Payer: Medicaid Other | Attending: Internal Medicine | Admitting: Internal Medicine

## 2023-04-19 DIAGNOSIS — N3001 Acute cystitis with hematuria: Secondary | ICD-10-CM | POA: Diagnosis not present

## 2023-04-19 LAB — POCT URINALYSIS DIP (MANUAL ENTRY)
Bilirubin, UA: NEGATIVE
Glucose, UA: NEGATIVE mg/dL
Nitrite, UA: NEGATIVE
Protein Ur, POC: 30 mg/dL — AB
Spec Grav, UA: 1.01 (ref 1.010–1.025)
Urobilinogen, UA: 0.2 E.U./dL
pH, UA: 6.5 (ref 5.0–8.0)

## 2023-04-19 MED ORDER — PHENAZOPYRIDINE HCL 200 MG PO TABS: 200.0000 mg | ORAL_TABLET | Freq: Three times a day (TID) | ORAL | 0 refills | Status: DC | PRN

## 2023-04-19 MED ORDER — CEPHALEXIN 500 MG PO CAPS: 500.0000 mg | ORAL_CAPSULE | Freq: Two times a day (BID) | ORAL | 0 refills | Status: AC

## 2023-04-19 NOTE — ED Triage Notes (Signed)
Pt reports last week was dx with ear infection and put on antibiotics. Reports that today started having urinary frequency and noticed when wiped had light bleeding. LMP 7/21.

## 2023-04-19 NOTE — ED Provider Notes (Signed)
[facility]    CSN: [id number] Arrival date & time: 04/19/23  1501      History   Chief Complaint Chief Complaint  Patient presents with   Urinary Frequency    HPI Tamara Church is a 47 y.o. female comes to the urgent care with dysuria, urgency or frequency which started a few hours ago.  Patient was engaged in sexual intercourse this morning.  She denies any superficial or deep dyspareunia.  A few hours after the sexual intercourse patient started experiencing the above-mentioned symptoms.  She denies any vaginal discharge or vaginal bleeding.  No blood in urine.  No abdominal pain.  No nausea or vomiting.  No fever or chills.  Patient is in a monogamous relationship.Marland Kitchen   HPI  Past Medical History:  Diagnosis Date   Abdominal pain, epigastric 07/27/2020   Anemia    ANEMIA 09/07/2008   Qualifier: Diagnosis of  By: Earnest Bailey MD, Kim     Bacterial vaginosis 05/08/2020   Calculus of gallbladder without cholecystitis without obstruction 07/05/2021   Constipation 05/18/2020   Early satiety 05/18/2020   Gastritis and gastroduodenitis 07/27/2020   GERD (gastroesophageal reflux disease)    Hypertension    Non-intractable vomiting 05/18/2020   Palpitations 03/22/2020   Prolonged QT interval 10/04/2020   Screening-pulmonary TB 10/08/2021   Sore throat 09/18/2020   Unintentional weight loss 05/18/2020   UTI (urinary tract infection) 04/23/2020    Patient Active Problem List   Diagnosis Date Noted   AOM (acute otitis media) 04/12/2023   Right arm pain 10/22/2021   Prediabetes 10/08/2021   Colon cancer screening 07/05/2021   Adjustment disorder with depressed mood 03/15/2020   Seasonal allergies 02/08/2020   Decreased hearing 12/15/2019   Healthcare maintenance 07/14/2019   CKD (chronic kidney disease) stage 2, GFR 60-89 ml/min 03/21/2012   GERD 09/04/2008   OBESITY, NOS 11/18/2006   HYPERTENSION, BENIGN SYSTEMIC 11/18/2006    Past Surgical History:  Procedure Laterality  Date   NO PAST SURGERIES     UPPER GASTROINTESTINAL ENDOSCOPY      OB History     Gravida  6   Para  6   Term  6   Preterm  0   AB  0   Living  6      SAB  0   IAB  0   Ectopic  0   Multiple  0   Live Births               Home Medications    Prior to Admission medications   Medication Sig Start Date End Date Taking? Authorizing Provider  cephALEXin (KEFLEX) 500 MG capsule Take 1 capsule (500 mg total) by mouth 2 (two) times daily for 5 days. 04/19/23 04/24/23 Yes Alecea Trego, Britta Mccreedy, MD  phenazopyridine (PYRIDIUM) 200 MG tablet Take 1 tablet (200 mg total) by mouth 3 (three) times daily as needed for pain (dysuria). 04/19/23  Yes Leandrew Keech, Britta Mccreedy, MD  amLODipine (NORVASC) 10 MG tablet Take 1 tablet (10 mg total) by mouth daily. 07/31/22   Valetta Close, MD  chlorhexidine (PERIDEX) 0.12 % solution Use as directed 15 mLs in the mouth or throat 2 (two) times daily. 02/23/23   Al Decant, PA-C  dicyclomine (BENTYL) 20 MG tablet Take 1 tablet (20 mg total) by mouth 2 (two) times daily for 7 days. 04/14/23 04/21/23  Sloan Leiter, DO  famotidine (PEPCID) 20 MG tablet Take 1 tablet (20 mg total) by  mouth 2 (two) times daily. 03/28/23   Curatolo, Adam, DO  [name] (alum & mag hydroxide, lidocaine, dicyclomine) oral mixture Take 30 mLs by mouth 3 (three) times daily as needed. Swish and swallow 90 ml viscous lidocaine 90 ml 10 mg/5 ml dicyclomine  270 ml maalox 04/06/23   Mansouraty, Netty Starring., MD  levonorgestrel (MIRENA) 20 MCG/24HR IUD 1 each by Intrauterine route once.    [provider]  metoCLOPramide (REGLAN) 10 MG tablet Take 1 tablet (10 mg total) by mouth every 6 (six) hours as needed for nausea. 04/07/23   Mesner, Barbara Cower, MD  ondansetron (ZOFRAN) 4 MG tablet Take 1 tablet (4 mg total) by mouth every 8 (eight) hours as needed for nausea or vomiting. 11/30/22   Blue, Soijett A, PA-C  RABEprazole (ACIPHEX) 20 MG tablet Take 1 tablet (20 mg total) by  mouth 2 (two) times daily. 12/31/22   Mansouraty, Netty Starring., MD  spironolactone (ALDACTONE) 25 MG tablet Take 2 tablets (50 mg total) by mouth at bedtime. 09/17/22   Valetta Close, MD  sucralfate (CARAFATE) 1 g tablet TAKE 1 TABLET BY MOUTH TWICE A DAY 03/26/23   Meredith Pel, NP  sucralfate (CARAFATE) 1 g tablet Take 1 tablet (1 g total) by mouth with breakfast, with lunch, and with evening meal for 7 days. 04/14/23 04/21/23  Sloan Leiter, DO    Family History Family History  Problem Relation Age of Onset   Hypertension Mother    Colon cancer Neg Hx    Esophageal cancer Neg Hx    Rectal cancer Neg Hx    Stomach cancer Neg Hx    Inflammatory bowel disease Neg Hx    Liver disease Neg Hx    Pancreatic cancer Neg Hx     Social History Social History   Tobacco Use   Smoking status: Never   Smokeless tobacco: Never  Vaping Use   Vaping status: Never Used  Substance Use Topics   Alcohol use: No   Drug use: No     Allergies   Ace inhibitors, Angiotensin receptor blockers, and Hctz [hydrochlorothiazide]   Review of Systems Review of Systems As per HPI  Physical Exam Triage Vital Signs ED Triage Vitals [04/19/23 1557]  Encounter Vitals Group     BP (!) 139/94     Systolic BP Percentile      Diastolic BP Percentile      Pulse Rate 100     Resp 17     Temp 98.2 F (36.8 C)     Temp Source Oral     SpO2 99 %     Weight      Height      Head Circumference      Peak Flow      Pain Score 0     Pain Loc      Pain Education      Exclude from Growth Chart    No data found.  Updated Vital Signs BP (!) 139/94 (BP Location: Left Arm)   Pulse 100   Temp 98.2 F (36.8 C) (Oral)   Resp 17   SpO2 99%   Visual Acuity Right Eye Distance:   Left Eye Distance:   Bilateral Distance:    Right Eye Near:   Left Eye Near:    Bilateral Near:     Physical Exam Vitals and nursing note reviewed.  Constitutional:      General: She is in acute distress.  Appearance: She is not ill-appearing.  Cardiovascular:     Rate and Rhythm: Normal rate.     Pulses: Normal pulses.     Heart sounds: Normal heart sounds.  Pulmonary:     Effort: Pulmonary effort is normal.     Breath sounds: Normal breath sounds.  Abdominal:     General: Abdomen is flat. Bowel sounds are normal.  Neurological:     Mental Status: She is alert.      UC Treatments / Results  Labs (all labs ordered are listed, but only abnormal results are displayed) Labs Reviewed  POCT URINALYSIS DIP (MANUAL ENTRY) - Abnormal; Notable for the following components:      Result Value   Color, UA orange (*)    Clarity, UA hazy (*)    Ketones, POC UA moderate (40) (*)    Blood, UA large (*)    Protein Ur, POC =30 (*)    Leukocytes, UA Moderate (2+) (*)    All other components within normal limits  URINE CULTURE    EKG   Radiology No results found.  Procedures Procedures (including critical care time)  Medications Ordered in UC Medications - No data to display  Initial Impression / Assessment and Plan / UC Course  I have reviewed the triage vital signs and the nursing notes.  Pertinent labs & imaging results that were available during my care of the patient were reviewed by me and considered in my medical decision making (see chart for details).     1.  Acute cystitis with hematuria: Point-of-care urinalysis is positive for ketones, leukocyte esterase, hemoglobin Urine cultures have been sent Pyridium as needed for pain Keflex 500 mg twice daily for 5 days Patient is advised to increase oral fluid intake Return precautions given. Final Clinical Impressions(s) / UC Diagnoses   Final diagnoses:  Acute cystitis with hematuria     Discharge Instructions      Please increase oral fluid intake Please take medications as prescribed Your urine is remarkable for urinary tract infection Will call you with recommendations if labs are abnormal Return to urgent  care if you have worsening symptoms.    ED Prescriptions     Medication Sig Dispense Auth. Provider   cephALEXin (KEFLEX) 500 MG capsule Take 1 capsule (500 mg total) by mouth 2 (two) times daily for 5 days. 10 capsule Deyani Hegarty, Britta Mccreedy, MD   phenazopyridine (PYRIDIUM) 200 MG tablet Take 1 tablet (200 mg total) by mouth 3 (three) times daily as needed for pain (dysuria). 6 tablet Berry Gallacher, Britta Mccreedy, MD      PDMP not reviewed this encounter.   Merrilee Jansky, MD 04/19/23 [phone]

## 2023-04-19 NOTE — Discharge Instructions (Addendum)
Please increase oral fluid intake Please take medications as prescribed Your urine is remarkable for urinary tract infection Will call you with recommendations if labs are abnormal Return to urgent care if you have worsening symptoms.

## 2023-04-20 ENCOUNTER — Other Ambulatory Visit: Payer: Self-pay

## 2023-04-20 ENCOUNTER — Ambulatory Visit: Payer: Medicaid Other | Admitting: Nurse Practitioner

## 2023-04-20 ENCOUNTER — Encounter: Payer: Self-pay | Admitting: Nurse Practitioner

## 2023-04-20 ENCOUNTER — Telehealth: Payer: Self-pay | Admitting: Nurse Practitioner

## 2023-04-20 VITALS — BP 136/80 | HR 56 | Ht 62.0 in | Wt 197.0 lb

## 2023-04-20 DIAGNOSIS — Z1211 Encounter for screening for malignant neoplasm of colon: Secondary | ICD-10-CM

## 2023-04-20 DIAGNOSIS — K219 Gastro-esophageal reflux disease without esophagitis: Secondary | ICD-10-CM

## 2023-04-20 MED ORDER — LIDOCAINE VISCOUS HCL 2 % MT SOLN: OROMUCOSAL | 0 refills | Status: DC

## 2023-04-20 MED ORDER — NA SULFATE-K SULFATE-MG SULF 17.5-3.13-1.6 GM/177ML PO SOLN: 1.0000 | ORAL | 0 refills | Status: DC

## 2023-04-20 NOTE — Progress Notes (Signed)
Primary GI: Tamara Parish, MD   ASSESSMENT & PLAN   Brief Narrative:  47 y.o.  female whose past medical history includes,  but is not necessarily limited to, HTN, GERD, hiatal hernia , Schatzki's ring , non-H. pylori related gastritis , cholelithiasis s/p cholecystectomy.   Upper abdominal pain, nausea , vomiting. Symptoms resolved following cholecystectomy in January 2024  GERD, breakthrough pyrosis on high dose PPI, BID famotidine, Carafate, and anti-reflux measures   -Will send in Rx for [name] to use once daily as needed since it helped her in the ED.  - She has taken Carafate on a regular basis for months. Will discontinue and see how she does since previous GI symptoms have resolved post cholecystectomy. She may resume for recurrent abdominal pain / nausea or vomiting.  - Continue anti-reflux measures.  - Continue BID pepcid and BID Aciphex - If persistent symptoms then consider 24 hour pH study. Otherwise, follow up with me in 6 months. Hopefully at some point we can de-escalate reflux regimen.   Colon cancer screening -Schedule for a screening colonoscopy. The risks and benefits of colonoscopy with possible polypectomy / biopsies were discussed and the patient agrees to proceed.   HPI   Brief GI history Tamara Church .has previously been evaluated here for GERD symptoms, abdominal pain, nausea and vomiting .  Prior workup included a CT scan, [place], gastric emptying study, labs and EGD x 2. Found to have gastritis,  a 4 cm hiatal hernia and gallstones.) In January she underwent CCY at [facility]. Symptoms resolved but lately having breakthrough pyrosis despite high dose PPI. Seen in ED recently for reflux.   Interval History   Chief complaint:   heartburn  Tamara Church hasn't had any significant N/V or abdominal pain since her CCY in January. However, she has been having intermittent breakthrough GERD symptoms despite anti-reflux measures and reflux medication. The pyrosis is  sporadic, mainly related to certain foods but the list of culprit foods is growing  (salads , BBQ sauce, dressings, tomatoes, etc. ) Recently developed severe heartburn after eating a salad. Seen in ED and symptoms resolved with a [name]  ED was supposed to call in Rx for [name] but wasn't done apparently. She is taking BID Pepcid, BID Aciphex BID and sucralfate TID. She is following acid reflux precautions.     Previous GI Endoscopies / Labs / Imaging   **May not include all endoscopic evaluations   Most recent EGD 08/29/22 for N/V and upper abdominal pain -No gross lesions in the entire esophagus.  A widely patent Schatzki's ring was found at the GE junction.  The Z-line was regular and found at 31 cm from the incisors.  A 4 cm hiatal hernia was present.  Segmental mild mucosal changes characterized by congestion, erythema and altered texture were found in the antrum and prepyloric region .  No gross lesions in the duodenal bulb, and the first portion of the duodenum and in the second portion of the duodenum  Diagnosis 1. Surgical [P], gastric - GASTRIC ANTRAL AND OXYNTIC MUCOSA WITH FEATURES OF REACTIVE GASTROPATHY - NEGATIVE FOR H. PYLORI ON H&E STAIN - NEGATIVE FOR INTESTINAL METAPLASIA OR MALIGNANCY 2. Surgical [P], duodenal - BENIGN SMALL BOWEL MUCOSA WITH NO SIGNIFICANT PATHOLOGIC CHANGES     Latest Ref Rng & Units 04/14/2023   10:32 PM 04/02/2023    8:07 PM 12/02/2022   10:57 AM  Hepatic Function  Total Protein 6.5 - 8.1 g/dL 8.7  8.3  8.8   Albumin 3.5 - 5.0 g/dL 4.6  4.5  4.6   AST 15 - 41 U/L 13  15  15    ALT 0 - 44 U/L 12  54  17   Alk Phosphatase 38 - 126 U/L 48  57  54   Total Bilirubin 0.3 - 1.2 mg/dL 0.5  0.3  0.5        Latest Ref Rng & Units 04/14/2023   10:32 PM 04/02/2023    8:07 PM 12/02/2022   10:57 AM  CBC  WBC 4.0 - 10.5 K/uL 6.7  9.2  8.5   Hemoglobin 12.0 - 15.0 g/dL 16.1  09.6  04.5   Hematocrit 36.0 - 46.0 % 36.9  37.6  37.2   Platelets 150 -  400 K/uL 269  285  288      Past Medical History:  Diagnosis Date   Abdominal pain, epigastric 07/27/2020   Anemia    ANEMIA 09/07/2008   Qualifier: Diagnosis of  By: Earnest Bailey MD, Kim     Bacterial vaginosis 05/08/2020   Calculus of gallbladder without cholecystitis without obstruction 07/05/2021   Constipation 05/18/2020   Early satiety 05/18/2020   Gastritis and gastroduodenitis 07/27/2020   GERD (gastroesophageal reflux disease)    Hypertension    Non-intractable vomiting 05/18/2020   Palpitations 03/22/2020   Prolonged QT interval 10/04/2020   Screening-pulmonary TB 10/08/2021   Sore throat 09/18/2020   Unintentional weight loss 05/18/2020   UTI (urinary tract infection) 04/23/2020    Past Surgical History:  Procedure Laterality Date   NO PAST SURGERIES     UPPER GASTROINTESTINAL ENDOSCOPY      Family History  Problem Relation Age of Onset   Hypertension Mother    Colon cancer Neg Hx    Esophageal cancer Neg Hx    Rectal cancer Neg Hx    Stomach cancer Neg Hx    Inflammatory bowel disease Neg Hx    Liver disease Neg Hx    Pancreatic cancer Neg Hx     Current Medications, Allergies, Family History and Social History were reviewed in [employer] record.     Current Outpatient Medications  Medication Sig Dispense Refill   amLODipine (NORVASC) 10 MG tablet Take 1 tablet (10 mg total) by mouth daily. 90 tablet 3   cephALEXin (KEFLEX) 500 MG capsule Take 1 capsule (500 mg total) by mouth 2 (two) times daily for 5 days. 10 capsule 0   chlorhexidine (PERIDEX) 0.12 % solution Use as directed 15 mLs in the mouth or throat 2 (two) times daily. 120 mL 0   dicyclomine (BENTYL) 20 MG tablet Take 1 tablet (20 mg total) by mouth 2 (two) times daily for 7 days. 14 tablet 0   famotidine (PEPCID) 20 MG tablet Take 1 tablet (20 mg total) by mouth 2 (two) times daily. 30 tablet 0   [name] (alum & mag hydroxide, lidocaine, dicyclomine) oral mixture Take 30 mLs by  mouth 3 (three) times daily as needed. Swish and swallow 90 ml viscous lidocaine 90 ml 10 mg/5 ml dicyclomine  270 ml maalox 550 mL 1   levonorgestrel (MIRENA) 20 MCG/24HR IUD 1 each by Intrauterine route once.     metoCLOPramide (REGLAN) 10 MG tablet Take 1 tablet (10 mg total) by mouth every 6 (six) hours as needed for nausea. 30 tablet 0   ondansetron (ZOFRAN) 4 MG tablet Take 1 tablet (4 mg total) by mouth every 8 (eight) hours as needed for  nausea or vomiting. 12 tablet 0   phenazopyridine (PYRIDIUM) 200 MG tablet Take 1 tablet (200 mg total) by mouth 3 (three) times daily as needed for pain (dysuria). 6 tablet 0   RABEprazole (ACIPHEX) 20 MG tablet Take 1 tablet (20 mg total) by mouth 2 (two) times daily. 60 tablet 6   spironolactone (ALDACTONE) 25 MG tablet Take 2 tablets (50 mg total) by mouth at bedtime. 180 tablet 3   sucralfate (CARAFATE) 1 g tablet TAKE 1 TABLET BY MOUTH TWICE A DAY 60 tablet 0   sucralfate (CARAFATE) 1 g tablet Take 1 tablet (1 g total) by mouth with breakfast, with lunch, and with evening meal for 7 days. 21 tablet 0   No current facility-administered medications for this visit.    Review of Systems: No chest pain. No shortness of breath. No urinary complaints.    Physical Exam  Wt Readings from Last 3 Encounters:  04/12/23 201 lb 6.4 oz (91.4 kg)  04/07/23 200 lb 9.9 oz (91 kg)  04/02/23 200 lb 9.9 oz (91 kg)    BP 136/80   Pulse (!) 56   Ht 5\' 2"  (1.575 m)   Wt 197 lb (89.4 kg)   BMI 36.03 kg/m  Constitutional:  Pleasant, generally well appearing female in no acute distress. Psychiatric: Normal mood and affect. Behavior is normal. EENT: Pupils normal.  Conjunctivae are normal. No scleral icterus. Neck supple.  Cardiovascular: Normal rate, regular rhythm.  Pulmonary/chest: Effort normal and breath sounds normal. No wheezing, rales or rhonchi. Abdominal: Soft, nondistended, nontender. Bowel sounds active throughout. There are no masses palpable. No  hepatomegaly. Neurological: Alert and oriented to person place and time.  Skin: Skin is warm and dry. No rashes noted.  Willette Cluster, NP  04/20/2023, 8:32 AM  Cc:

## 2023-04-20 NOTE — Progress Notes (Signed)
Attending Physician's Attestation   I have reviewed the chart.   I agree with the Advanced Practitioner's note, impression, and recommendations with any updates as below.    Gabriel Mansouraty, MD [facility] Office # [phone]  

## 2023-04-20 NOTE — Patient Instructions (Addendum)
_______________________________________________________  If your blood pressure at your visit was 140/90 or greater, please contact your primary care physician to follow up on this.  If you are age 46 or younger, your body mass index should be between 19-25. Your Body mass index is 36.03 kg/m. If this is out of the aformentioned range listed, please consider follow up with your Primary Care Provider.  ________________________________________________________  The [facility] providers would like to encourage you to use [facility] to communicate with providers for non-urgent requests or questions.  Due to long hold times on the telephone, sending your provider a message by [facility] may be a faster and more efficient way to get a response.  Please allow 48 business hours for a response.  Please remember that this is for non-urgent requests.  _______________________________________________________  CONTINUE: Pepcid twice daily CONTINUE: Aciphex twice daily  DISCONTINUE: Carafate   We have sent the following medications to your pharmacy for you to pick up at your convenience:  START: [facility] [place] once daily as needed.  You have been scheduled for a colonoscopy. Please follow written instructions given to you at your visit today.   You have been scheduled for an Esophageal Manometry and 24 Hour pH study at [facility] on 08-25-23 at 10:30am am. Please arrive 30 minutes prior to your procedure for registration. You will need to go to outpatient registration (1st floor of the hospital) first. Make certain to bring your insurance cards as well as a complete list of medications.  Please remember the following:  1) Do not take any muscle relaxants, xanax (alprazolam) or ativan for 1 day prior to your test as well as the day of the test.  2) Nothing to eat or drink after 12:00 midnight on the night before your test.  3) Hold all diabetic medications/insulin the morning of the test. You may  eat and take your medications after the test.  4) For 7 days prior to your test, do not take: Reglan, Tagamet, Zantac, Phenergan, Axid or Pepcid.  5) You MAY use an antacid such as Rolaids or Tums up to 12 hours prior to your test.  ------------------------------------------------------------------------------------------- ABOUT ESOPHAGEAL MANOMETRY Esophageal manometry (muh-NOM-uh-tree) is a test that gauges how well your esophagus works. Your esophagus is the long, muscular tube that connects your throat to your stomach. Esophageal manometry measures the rhythmic muscle contractions (peristalsis) that occur in your esophagus when you swallow. Esophageal manometry also measures the coordination and force exerted by the muscles of your esophagus.  During esophageal manometry, a thin, flexible tube (catheter) that contains sensors is passed through your nose, down your esophagus and into your stomach. Esophageal manometry can be helpful in diagnosing some mostly uncommon disorders that affect your esophagus.  Why it's done Esophageal manometry is used to evaluate the movement (motility) of food through the esophagus and into the stomach. The test measures how well the circular bands of muscle (sphincters) at the top and bottom of your esophagus open and close, as well as the pressure, strength and pattern of the wave of esophageal muscle contractions that moves food along.  What you can expect Esophageal manometry is an outpatient procedure done without sedation. Most people tolerate it well. You may be asked to change into a hospital gown before the test starts.  During esophageal manometry  While you are sitting up, a member of your health care team sprays your throat with a numbing medication or puts numbing gel in your nose or both.  A catheter is guided through your nose into your esophagus. The catheter may be sheathed in a water-filled sleeve. It doesn't interfere with your breathing. However,  your eyes may water, and you may gag. You may have a slight nosebleed from irritation.  After the catheter is in place, you may be asked to lie on your back on an exam table, or you may be asked to remain seated.  You then swallow small sips of water. As you do, a computer connected to the catheter records the pressure, strength and pattern of your esophageal muscle contractions.  During the test, you'll be asked to breathe slowly and smoothly, remain as still as possible, and swallow only when you're asked to do so.  A member of your health care team may move the catheter down into your stomach while the catheter continues its measurements.  The catheter then is slowly withdrawn.  This test typically takes 30-45 minutes to complete.  ---------------------------------------------------------------------------------------------- ABOUT 24 HOUR PH PROBE An esophageal pH test measures and records the pH in your esophagus to determine if you have gastroesophageal reflux disease (GERD). The test can also be done to determine the effectiveness of medications or surgical treatment for GERD. What is esophageal reflux? Esophageal reflux is a condition in which stomach acid refluxes or moves back into the esophagus (the "food pipe" leading from the mouth to the stomach). How does the esophageal pH test work? A thin, small tube with an acid sensing device on the tip is gently passed through your nose, down the esophagus ("food tube"), and positioned about 2 inches above the lower esophageal sphincter. The tube is secured to the side of your face with clear tape. The end of the tube exiting from your nose is attached to a portable recorder that is worn on your belt or over your shoulder. The recorder has several buttons on it that you will press to mark certain events. A nurse will review the monitoring instructions with you. Once the test has begun, what do I need to know and do? Activity: Follow your usual  daily routine. Do not reduce or change your activities during the monitoring period. Doing so can make the monitoring results less useful.  Note: do not take a tub bath or shower; the equipment can't get wet.  Eating: Eat your regular meals at the usual times. If you do not eat during the monitoring period, your stomach will not produce acid as usual, and the test results will not be accurate. Eat at least 2 meals a day. Eat foods that tend to increase your symptoms (without making yourself miserable). Avoid snacking. Do not suck on hard candy or lozenges and do not chew gum during the monitoring period.  Lying down: Remain upright throughout the day. Do not lie down until you go to bed (unless napping or lying down during the day is part of your daily routine).  Medications: Continue to follow your doctor's advice regarding medications to avoid during the monitoring period.  Recording symptoms: Press the appropriate button on your recorder when symptoms occur (as discussed with the nurse).  Recording events: Record the time you start and stop eating and drinking (anything other than plain water). Record the time you lie down (even if just resting) and when you get back up. The nurse will explain this.  Unusual symptoms or side effects. If you think you may be experiencing any unusual symptoms or side effects, call your doctor.  You will return the next  day to have the tube removed. The information on the recorder will be downloaded to a computer and the results will be analyzed.  After completion of the study Resume your normal diet and medications. Lozenges or hard candy may help ease any sore throat caused by the tube.   It will take at least 2 weeks to receive the results of this test from your physician.  Please pick up your prep supplies at the pharmacy within the next 1-3 days.  If you use inhalers (even only as needed), please bring them with you on the day of your procedure.  DO NOT TAKE 7  DAYS PRIOR TO TEST- Trulicity (dulaglutide) Ozempic, Wegovy (semaglutide) Mounjaro (tirzepatide) Bydureon Bcise (exanatide extended release)  DO NOT TAKE 1 DAY PRIOR TO YOUR TEST Rybelsus (semaglutide) Adlyxin (lixisenatide) Victoza (liraglutide) Byetta (exanatide) ___________________________________________________________________________  Due to recent changes in healthcare laws, you may see the results of your imaging and laboratory studies on MyChart before your provider has had a chance to review them.  We understand that in some cases there may be results that are confusing or concerning to you. Not all laboratory results come back in the same time frame and the provider may be waiting for multiple results in order to interpret others.  Please give [place] 48 hours in order for your provider to thoroughly review all the results before contacting the office for clarification of your results.   Thank you for entrusting me with your care and choosing [facility].  Gunnar Fusi, NP

## 2023-04-20 NOTE — Telephone Encounter (Signed)
Inbound call from patient stating that the pharmacy is not able to prescribe the [name]. Patient stated it needs to be sent to a different pharmacy or if not, the medication needs to be changed. Please advise.

## 2023-04-20 NOTE — Telephone Encounter (Signed)
Patient aware that Rx for [name] was sent to [facility].  She will call our office and speak to me with any further issues.  Patient agreed to plan and verbalized understanding.  No further questions.

## 2023-04-25 ENCOUNTER — Other Ambulatory Visit: Payer: Self-pay | Admitting: Nurse Practitioner

## 2023-04-25 DIAGNOSIS — R110 Nausea: Secondary | ICD-10-CM

## 2023-04-27 ENCOUNTER — Ambulatory Visit (INDEPENDENT_AMBULATORY_CARE_PROVIDER_SITE_OTHER): Payer: Medicaid Other | Admitting: Student

## 2023-04-27 ENCOUNTER — Other Ambulatory Visit (HOSPITAL_COMMUNITY)
Admission: RE | Admit: 2023-04-27 | Discharge: 2023-04-27 | Disposition: A | Discharge status: Home / Self Care | Payer: Medicaid Other | Source: Ambulatory Visit | Attending: Family Medicine | Admitting: Family Medicine

## 2023-04-27 ENCOUNTER — Encounter: Payer: Self-pay | Admitting: Student

## 2023-04-27 VITALS — BP 133/95 | HR 84 | Wt 195.0 lb

## 2023-04-27 DIAGNOSIS — Z124 Encounter for screening for malignant neoplasm of cervix: Secondary | ICD-10-CM | POA: Diagnosis not present

## 2023-04-27 DIAGNOSIS — B3731 Acute candidiasis of vulva and vagina: Secondary | ICD-10-CM | POA: Diagnosis not present

## 2023-04-27 DIAGNOSIS — N3000 Acute cystitis without hematuria: Secondary | ICD-10-CM | POA: Diagnosis not present

## 2023-04-27 MED ORDER — NITROFURANTOIN MONOHYD MACRO 100 MG PO CAPS
100.0000 mg | ORAL_CAPSULE | Freq: Two times a day (BID) | ORAL | 0 refills | Status: AC
Start: 2023-04-27 — End: 2023-05-02

## 2023-04-27 MED ORDER — FLUCONAZOLE 150 MG PO TABS
150.0000 mg | ORAL_TABLET | Freq: Once | ORAL | 0 refills | Status: AC
Start: 2023-04-27 — End: 2023-04-27

## 2023-04-27 NOTE — Assessment & Plan Note (Signed)
Due to failed treatment with Keflex due to patient not being able to tolerate. Will prescribe 5 day course of macrobid BID. Instructed patient to call if she begins to have nausea and we can try an anti-nausea medication.

## 2023-04-27 NOTE — Patient Instructions (Signed)
It was great to see you today!   Today we addressed: I am sending you home with 5 day course of Macrobid - please call and let me know if you are unable to tolerate this medication.  If you get a yeast infection I have sent a dose of an oral pill for you to take.   Future Appointments  Date Time Provider Department Center  06/15/2023  3:00 PM Mansouraty, Netty Starring., MD [facility]    Please arrive 15 minutes before your appointment to ensure smooth check in process.    Please call the clinic at [phone] if your symptoms worsen or you have any concerns.  Thank you for allowing me to participate in your care, Dr. Glendale Chard [facility]

## 2023-04-27 NOTE — Assessment & Plan Note (Signed)
Due to patient having recurrent yeast infections from antibiotics - will prescribe diflucan 150 mg pill as needed for patient to pick up from pharmacy if she begins to have symptoms.

## 2023-04-27 NOTE — Progress Notes (Addendum)
    SUBJECTIVE:   CHIEF COMPLAINT / HPI:   Tamara Church is a 47 y.o. female  presenting for pap smear and UTI follow up. She went to urgent care 7/29 for UTI symptoms and had a urine culture that resulted with E coli that was only sensitive to pip/tazo, cephalosporin, and macrobid. She was prescribed Keflex but was unable to tolerate the antibiotic due to diarrhea and vomiting and only took a few doses. She began having repeat UTI symptoms frequency and burning today. She denies chills, flank pain or fever.   PERTINENT  PMH / PSH: Reviewed and updated   OBJECTIVE:   BP (!) 133/95   Pulse 84   Wt 195 lb (88.5 kg)   SpO2 100%   BMI 35.67 kg/m   Well-appearing, no acute distress Cardio: Regular rate, regular rhythm, no murmurs on exam. Pulm: Clear, no wheezing, no crackles. No increased work of breathing Abdominal: bowel sounds present, soft, non-tender, non-distended, no CVA tenderness.  Extremities: no peripheral edema  Neuro: alert and oriented x3, speech normal in content, no facial asymmetry, strength intact and equal bilaterally in UE and LE, pupils equal and reactive to light.  Psych:  Cognition and judgment appear intact. Alert, communicative  and cooperative with normal attention span and concentration. No apparent delusions, illusions, hallucinations   Pelvic Exam: MA chaperone present  Normal external genitalia No abnormal discharge  IUD strings visualized and in place  No cervical motion tenderness  Cervix visualized with no lesions        04/12/2023    2:26 PM 07/03/2022   10:53 AM 10/22/2021    3:31 PM  PHQ9 SCORE ONLY  PHQ-9 Total Score 0 1 0      ASSESSMENT/PLAN:   UTI (urinary tract infection) Due to failed treatment with Keflex due to patient not being able to tolerate. Will prescribe 5 day course of macrobid BID. Instructed patient to call if she begins to have nausea and we can try an anti-nausea medication.   Vaginal candidiasis Due to patient  having recurrent yeast infections from antibiotics - will prescribe diflucan 150 mg pill as needed for patient to pick up from pharmacy if she begins to have symptoms.    Pap smear completed today with GC testing.   Glendale Chard, DO [facility]

## 2023-04-28 LAB — CYTOLOGY - PAP
Chlamydia: NEGATIVE
Comment: NEGATIVE
Comment: NEGATIVE
Comment: NORMAL
Diagnosis: NEGATIVE
High risk HPV: NEGATIVE
Neisseria Gonorrhea: NEGATIVE

## 2023-04-29 ENCOUNTER — Telehealth: Payer: Self-pay | Admitting: Student

## 2023-04-29 NOTE — Telephone Encounter (Signed)
Called patient to discuss pap results which were normal. She will be due for her next pap in 3 years, 04/2026. She is doing well with the Macrobid antibiotic for her UTI.   Glendale Chard, DO [facility], PGY-2 04/29/23 9:35 AM

## 2023-05-06 ENCOUNTER — Emergency Department (HOSPITAL_COMMUNITY)
Admission: EM | Admit: 2023-05-06 | Discharge: 2023-05-06 | Discharge status: Against Medical Advice | Payer: Medicaid Other | Attending: Emergency Medicine | Admitting: Emergency Medicine

## 2023-05-06 ENCOUNTER — Telehealth: Payer: Self-pay | Admitting: Nurse Practitioner

## 2023-05-06 ENCOUNTER — Other Ambulatory Visit: Payer: Self-pay

## 2023-05-06 ENCOUNTER — Encounter (HOSPITAL_BASED_OUTPATIENT_CLINIC_OR_DEPARTMENT_OTHER): Payer: Self-pay | Admitting: Emergency Medicine

## 2023-05-06 ENCOUNTER — Emergency Department (HOSPITAL_COMMUNITY): Payer: Medicaid Other

## 2023-05-06 DIAGNOSIS — R110 Nausea: Secondary | ICD-10-CM | POA: Insufficient documentation

## 2023-05-06 DIAGNOSIS — Z79899 Other long term (current) drug therapy: Secondary | ICD-10-CM | POA: Insufficient documentation

## 2023-05-06 DIAGNOSIS — I10 Essential (primary) hypertension: Secondary | ICD-10-CM | POA: Insufficient documentation

## 2023-05-06 DIAGNOSIS — R0602 Shortness of breath: Secondary | ICD-10-CM | POA: Diagnosis not present

## 2023-05-06 DIAGNOSIS — R0789 Other chest pain: Secondary | ICD-10-CM | POA: Insufficient documentation

## 2023-05-06 DIAGNOSIS — Z5321 Procedure and treatment not carried out due to patient leaving prior to being seen by health care provider: Secondary | ICD-10-CM | POA: Diagnosis not present

## 2023-05-06 DIAGNOSIS — R1013 Epigastric pain: Secondary | ICD-10-CM | POA: Insufficient documentation

## 2023-05-06 DIAGNOSIS — K219 Gastro-esophageal reflux disease without esophagitis: Secondary | ICD-10-CM | POA: Insufficient documentation

## 2023-05-06 DIAGNOSIS — R079 Chest pain, unspecified: Secondary | ICD-10-CM | POA: Diagnosis not present

## 2023-05-06 LAB — BASIC METABOLIC PANEL
Anion gap: 17 — ABNORMAL HIGH (ref 5–15)
BUN: 6 mg/dL (ref 6–20)
CO2: 20 mmol/L — ABNORMAL LOW (ref 22–32)
Calcium: 10.2 mg/dL (ref 8.9–10.3)
Chloride: 100 mmol/L (ref 98–111)
Creatinine, Ser: 1.01 mg/dL — ABNORMAL HIGH (ref 0.44–1.00)
GFR, Estimated: >60 mL/min (ref >60)
Glucose, Bld: 114 mg/dL — ABNORMAL HIGH (ref 70–99)
Potassium: 3.4 mmol/L — ABNORMAL LOW (ref 3.5–5.1)
Sodium: 137 mmol/L (ref 135–145)

## 2023-05-06 LAB — PROTIME-INR
INR: 1 (ref 0.8–1.2)
Prothrombin Time: 13.1 seconds (ref 11.4–15.2)

## 2023-05-06 LAB — CBC
HCT: 37.9 % (ref 36.0–46.0)
Hemoglobin: 12.5 g/dL (ref 12.0–15.0)
MCH: 28.8 pg (ref 26.0–34.0)
MCHC: 33 g/dL (ref 30.0–36.0)
MCV: 87.3 fL (ref 80.0–100.0)
Platelets: 271 10*3/uL (ref 150–400)
RBC: 4.34 MIL/uL (ref 3.87–5.11)
RDW: 13.2 % (ref 11.5–15.5)
WBC: 6 10*3/uL (ref 4.0–10.5)
nRBC: 0 % (ref 0.0–0.2)

## 2023-05-06 LAB — HCG, SERUM, QUALITATIVE: Preg, Serum: NEGATIVE

## 2023-05-06 LAB — TROPONIN I (HIGH SENSITIVITY): Troponin I (High Sensitivity): 5 ng/L (ref <18)

## 2023-05-06 NOTE — Telephone Encounter (Signed)
Inbound call from patient stating she has been "sick as a dog" for the past 2 week. Patient states she has been having severe abdominal pain. Patient requesting a call back to discuss further. Please advise, thank you.

## 2023-05-06 NOTE — ED Notes (Signed)
Pt seen leaving the ED with family.  

## 2023-05-06 NOTE — Telephone Encounter (Signed)
The patient felt her symptoms were not controlled with BID Pepcid and Aciphex. She started taking Carafate again. She believes this has made her feel worse and has stopped taking it. She takes "[name] only when I need it." Patient is asking for new options or next step to make her feel better.

## 2023-05-06 NOTE — ED Notes (Signed)
[facility] called to state the patient has checked in over there

## 2023-05-06 NOTE — ED Triage Notes (Signed)
Patient reports left chest pain with SOB and nausea onset this morning .

## 2023-05-06 NOTE — ED Triage Notes (Signed)
Pt c/o left sided chest pain with SOB and nausea since this am, pt was at [facility] and left AMA to present here

## 2023-05-06 NOTE — ED Provider Triage Note (Signed)
Emergency Medicine Provider Triage Evaluation Note  Tamara Church , a 47 y.o. female  was evaluated in triage.  Pt complains of left-sided chest pain that started around 3 PM today.  Pain has been constant and radiates down the left arm.  She states she is never had pain like this before.  Does have a history of high blood pressure.  She denies vomiting, diaphoresis, fever, chills.  She does endorse associated shortness of breath and nausea which has been present since this morning.  Review of Systems  Positive:  Negative: See above  Physical Exam  BP (!) 138/96 (BP Location: Left Arm)   Pulse 99   Temp 98.4 F (36.9 C) (Oral)   Resp 19   SpO2 100%  Gen:   Awake, no distress   Resp:  Normal effort  MSK:   Moves extremities without difficulty  Other:  Palpation of the left anterior chest wall does reproduce chest pain.  Medical Decision Making  Medically screening exam initiated at 8:01 PM.  Appropriate orders placed.  [name] was informed that the remainder of the evaluation will be completed by another provider, this initial triage assessment does not replace that evaluation, and the importance of remaining in the ED until their evaluation is complete.     Honor Loh [place], [place] 05/06/23 2002

## 2023-05-07 ENCOUNTER — Other Ambulatory Visit: Payer: Self-pay

## 2023-05-07 ENCOUNTER — Emergency Department (HOSPITAL_BASED_OUTPATIENT_CLINIC_OR_DEPARTMENT_OTHER)
Admission: EM | Admit: 2023-05-07 | Discharge: 2023-05-07 | Disposition: A | Discharge status: Home / Self Care | Payer: Medicaid Other | Source: Home / Self Care | Attending: Emergency Medicine | Admitting: Emergency Medicine

## 2023-05-07 DIAGNOSIS — K219 Gastro-esophageal reflux disease without esophagitis: Secondary | ICD-10-CM

## 2023-05-07 LAB — TROPONIN I (HIGH SENSITIVITY): Troponin I (High Sensitivity): 4 ng/L (ref <18)

## 2023-05-07 MED ORDER — FAMOTIDINE 20 MG PO TABS
20.0000 mg | ORAL_TABLET | Freq: Once | ORAL | Status: AC
  Administered 2023-05-07: 20 mg via ORAL
  Filled 2023-05-07: qty 1

## 2023-05-07 NOTE — Telephone Encounter (Signed)
Patient is advised. Manometry study scheduled first opening 08/25/23. I will monitor the schedule for a cancellation and move her if possible.

## 2023-05-07 NOTE — Telephone Encounter (Signed)
Spoke with the patient again to try getting clarification on her symptoms. She confirms she is referring to the acid reflux and burning sensation she feels is causing her to have pain. She went to the ER last night with this pain in her chest. ER provider gave her Pepcid. Patient tells me she felt this did relieve her symptoms. However she tells me famotidine does not seem to help. She also tells me she is having nausea which she believes is caused by Carafate. She feels full, cannot eat because of the fullness and the nausea.

## 2023-05-07 NOTE — Telephone Encounter (Signed)
Called the patient back. She states she is "sick as a dog" and "can't even drink water." She has taken her famotidine and Aciphex around 10:00 am. She took zofran about 1 pm. She ate apple sauce. That made her feel nauseated.   I looked into the Ph study. At this moment 08/25/23 is the first opening.  Please advise.

## 2023-05-07 NOTE — ED Provider Notes (Signed)
[place] EMERGENCY DEPARTMENT AT [facility] Provider Note   CSN: [id number] Arrival date & time: 05/06/23  2319     History  Chief Complaint  Patient presents with   Chest Pain    SOCORRA BALTES is a 47 y.o. female.  Patient is a 47 year old female with history of hypertension, gastritis/GERD, anemia.  Patient presenting today with complaints of epigastric/chest pain.  This started earlier today and was associated with gas and belching.  This has happened in the past and she has presented here several times.  She tells me Pepcid usually helps.  She denies any fevers or chills.  She denies any vomiting or diarrhea.  No shortness of breath, nausea, diaphoresis, or radiation to the arm or jaw.  She denies any exertional symptoms.  Patient initially presented to [facility], then left and came here due to prolonged wait times.  Laboratory studies obtained there prior to her leaving.  The history is provided by the patient.       Home Medications Prior to Admission medications   Medication Sig Start Date End Date Taking? Authorizing Provider  amLODipine (NORVASC) 10 MG tablet Take 1 tablet (10 mg total) by mouth daily. 07/31/22   Valetta Close, MD  chlorhexidine (PERIDEX) 0.12 % solution Use as directed 15 mLs in the mouth or throat 2 (two) times daily. 02/23/23   Al Decant, PA-C  dicyclomine (BENTYL) 20 MG tablet Take 1 tablet (20 mg total) by mouth 2 (two) times daily for 7 days. 04/14/23 04/21/23  Sloan Leiter, DO  famotidine (PEPCID) 20 MG tablet Take 1 tablet (20 mg total) by mouth 2 (two) times daily. 03/28/23   Curatolo, Adam, DO  [name] (alum & mag hydroxide, lidocaine, dicyclomine) oral mixture Take 30 mls by mouth daily as needed  Swish and swallow 90 ml  Viscous Lisocaine, 90 ml Dicyclomine 10mg /5 ml, maalox 04/20/23   Meredith Pel, NP  levonorgestrel (MIRENA) 20 MCG/24HR IUD 1 each by Intrauterine route once.    [provider]   metoCLOPramide (REGLAN) 10 MG tablet Take 1 tablet (10 mg total) by mouth every 6 (six) hours as needed for nausea. 04/07/23   Mesner, Barbara Cower, MD  Na Sulfate-K Sulfate-Mg Sulf (SUPREP BOWEL PREP KIT) 17.5-3.13-1.6 GM/177ML SOLN Take 1 kit by mouth as directed. 04/20/23   Meredith Pel, NP  ondansetron (ZOFRAN) 4 MG tablet Take 1 tablet (4 mg total) by mouth every 8 (eight) hours as needed for nausea or vomiting. 11/30/22   Blue, Soijett A, PA-C  phenazopyridine (PYRIDIUM) 200 MG tablet Take 1 tablet (200 mg total) by mouth 3 (three) times daily as needed for pain (dysuria). 04/19/23   Merrilee Jansky, MD  RABEprazole (ACIPHEX) 20 MG tablet Take 1 tablet (20 mg total) by mouth 2 (two) times daily. 12/31/22   Mansouraty, Netty Starring., MD  spironolactone (ALDACTONE) 25 MG tablet Take 2 tablets (50 mg total) by mouth at bedtime. 09/17/22   Valetta Close, MD  sucralfate (CARAFATE) 1 g tablet TAKE 1 TABLET BY MOUTH TWICE A DAY 03/26/23   Meredith Pel, NP      Allergies    Ace inhibitors, Angiotensin receptor blockers, and Hctz [hydrochlorothiazide]    Review of Systems   Review of Systems  All other systems reviewed and are negative.   Physical Exam Updated Vital Signs BP (!) 144/89   Pulse 77   Temp 98.5 F (36.9 C) (Oral)   Resp 18  Ht 5\' 2"  (1.575 m)   Wt 88.5 kg   SpO2 97%   BMI 35.67 kg/m  Physical Exam Vitals and nursing note reviewed.  Constitutional:      General: She is not in acute distress.    Appearance: She is well-developed. She is not diaphoretic.  HENT:     Head: Normocephalic and atraumatic.  Cardiovascular:     Rate and Rhythm: Normal rate and regular rhythm.     Heart sounds: No murmur heard.    No friction rub. No gallop.  Pulmonary:     Effort: Pulmonary effort is normal. No respiratory distress.     Breath sounds: Normal breath sounds. No wheezing.  Abdominal:     General: Bowel sounds are normal. There is no distension.     Palpations: Abdomen is  soft.     Tenderness: There is no abdominal tenderness.  Musculoskeletal:        General: Normal range of motion.     Cervical back: Normal range of motion and neck supple.  Skin:    General: Skin is warm and dry.  Neurological:     General: No focal deficit present.     Mental Status: She is alert and oriented to person, place, and time.     ED Results / Procedures / Treatments   Labs (all labs ordered are listed, but only abnormal results are displayed) Labs Reviewed  TROPONIN I (HIGH SENSITIVITY)    EKG None  Radiology DG Chest 2 View  Result Date: 05/06/2023 CLINICAL DATA:  Chest pain. EXAM: CHEST - 2 VIEW COMPARISON:  Chest radiograph dated 04/02/2023. FINDINGS: No focal consolidation, pleural effusion or pneumothorax. The cardiac silhouette is within normal limits. No acute osseous pathology. IMPRESSION: No active cardiopulmonary disease. Electronically Signed   By: Elgie Collard M.D.   On: 05/06/2023 21:05    Procedures Procedures    Medications Ordered in ED Medications  famotidine (PEPCID) tablet 20 mg (has no administration in time range)    ED Course/ Medical Decision Making/ A&P  Patient is a 47 year old female presenting with chest discomfort as described in the HPI.  She arrives here with stable vital signs and is afebrile.  Physical examination basically unremarkable.  Workup initiated including CBC, CMP, and troponin.  This was obtained at [facility].  All studies reviewed and are unremarkable.  Troponin is negative.  Troponin was repeated here and was again flat.  Chest x-ray showing no acute process.  At this point, symptoms are very atypical for cardiac pain and I highly doubt a cardiac etiology.  Workup is normal.  Patient does report a history of GERD and this sounds like the cause.  She was given Pepcid and seems to be feeling better.  Patient to be discharged with follow-up with her gastroenterologist and as needed return.  Final Clinical  Impression(s) / ED Diagnoses Final diagnoses:  None    Rx / DC Orders ED Discharge Orders     None         Geoffery Lyons, MD 05/07/23 [phone]

## 2023-05-07 NOTE — Discharge Instructions (Signed)
Continue home medications as previously prescribed.  Follow-up with your gastroenterologist if your symptoms persist.

## 2023-05-07 NOTE — Telephone Encounter (Signed)
Patient calling in regards to previous note. Please advise.

## 2023-05-08 ENCOUNTER — Emergency Department (HOSPITAL_BASED_OUTPATIENT_CLINIC_OR_DEPARTMENT_OTHER): Payer: Medicaid Other

## 2023-05-08 ENCOUNTER — Encounter (HOSPITAL_BASED_OUTPATIENT_CLINIC_OR_DEPARTMENT_OTHER): Payer: Self-pay

## 2023-05-08 ENCOUNTER — Emergency Department (HOSPITAL_BASED_OUTPATIENT_CLINIC_OR_DEPARTMENT_OTHER)
Admission: EM | Admit: 2023-05-08 | Discharge: 2023-05-09 | Disposition: A | Discharge status: Home / Self Care | Payer: Medicaid Other | Attending: Emergency Medicine | Admitting: Emergency Medicine

## 2023-05-08 DIAGNOSIS — Z79899 Other long term (current) drug therapy: Secondary | ICD-10-CM | POA: Insufficient documentation

## 2023-05-08 DIAGNOSIS — R531 Weakness: Secondary | ICD-10-CM | POA: Diagnosis not present

## 2023-05-08 DIAGNOSIS — I10 Essential (primary) hypertension: Secondary | ICD-10-CM | POA: Insufficient documentation

## 2023-05-08 DIAGNOSIS — R0789 Other chest pain: Secondary | ICD-10-CM | POA: Diagnosis not present

## 2023-05-08 DIAGNOSIS — H66006 Acute suppurative otitis media without spontaneous rupture of ear drum, recurrent, bilateral: Secondary | ICD-10-CM | POA: Insufficient documentation

## 2023-05-08 DIAGNOSIS — Z20822 Contact with and (suspected) exposure to covid-19: Secondary | ICD-10-CM | POA: Insufficient documentation

## 2023-05-08 DIAGNOSIS — H66003 Acute suppurative otitis media without spontaneous rupture of ear drum, bilateral: Secondary | ICD-10-CM | POA: Diagnosis not present

## 2023-05-08 DIAGNOSIS — R079 Chest pain, unspecified: Secondary | ICD-10-CM | POA: Diagnosis not present

## 2023-05-08 LAB — CBC
HCT: 35.4 % — ABNORMAL LOW (ref 36.0–46.0)
Hemoglobin: 11.9 g/dL — ABNORMAL LOW (ref 12.0–15.0)
MCH: 29.2 pg (ref 26.0–34.0)
MCHC: 33.6 g/dL (ref 30.0–36.0)
MCV: 86.8 fL (ref 80.0–100.0)
Platelets: 260 10*3/uL (ref 150–400)
RBC: 4.08 MIL/uL (ref 3.87–5.11)
RDW: 13 % (ref 11.5–15.5)
WBC: 8.3 10*3/uL (ref 4.0–10.5)
nRBC: 0 % (ref 0.0–0.2)

## 2023-05-08 LAB — BASIC METABOLIC PANEL
Anion gap: 12 (ref 5–15)
BUN: 7 mg/dL (ref 6–20)
CO2: 24 mmol/L (ref 22–32)
Calcium: 9.9 mg/dL (ref 8.9–10.3)
Chloride: 100 mmol/L (ref 98–111)
Creatinine, Ser: 0.85 mg/dL (ref 0.44–1.00)
GFR, Estimated: >60 mL/min (ref >60)
Glucose, Bld: 126 mg/dL — ABNORMAL HIGH (ref 70–99)
Potassium: 3.2 mmol/L — ABNORMAL LOW (ref 3.5–5.1)
Sodium: 136 mmol/L (ref 135–145)

## 2023-05-08 LAB — TROPONIN I (HIGH SENSITIVITY): Troponin I (High Sensitivity): 3 ng/L (ref <18)

## 2023-05-08 LAB — PREGNANCY, URINE: Preg Test, Ur: NEGATIVE

## 2023-05-08 MED ORDER — POTASSIUM CHLORIDE CRYS ER 20 MEQ PO TBCR
40.0000 meq | EXTENDED_RELEASE_TABLET | Freq: Once | ORAL | Status: AC
  Administered 2023-05-08: 40 meq via ORAL
  Filled 2023-05-08: qty 2

## 2023-05-08 MED ORDER — LACTATED RINGERS IV BOLUS
1000.0000 mL | Freq: Once | INTRAVENOUS | Status: AC
  Administered 2023-05-08: 1000 mL via INTRAVENOUS

## 2023-05-08 NOTE — Telephone Encounter (Signed)
Send patient in a [name] with Bentyl or Levsin included with it. She may use that twice daily as needed Get her follow-up in clinic with myself or PG as available, as her manometry/pH impedance testing is scheduled 4 months out. Thanks. GM

## 2023-05-08 NOTE — ED Triage Notes (Signed)
Pt arrived POV for contnued CP for several days, was dx with acid reflux and started medication as prescribe, pt reports CP not better and reports still not able to eat. Pt reports two episode of diarrhea in two days. Pt reports has spoken with her GI specialist who would like to run test on her but not till Dec. Denies abd pain or n/v. VSS, A&O x4.

## 2023-05-09 LAB — TROPONIN I (HIGH SENSITIVITY): Troponin I (High Sensitivity): 4 ng/L (ref <18)

## 2023-05-09 LAB — SARS CORONAVIRUS 2 BY RT PCR: SARS Coronavirus 2 by RT PCR: NEGATIVE

## 2023-05-09 MED ORDER — AMOXICILLIN-POT CLAVULANATE 875-125 MG PO TABS: 1.0000 | ORAL_TABLET | Freq: Two times a day (BID) | ORAL | 0 refills | Status: DC

## 2023-05-09 MED ORDER — AMOXICILLIN-POT CLAVULANATE 875-125 MG PO TABS
1.0000 | ORAL_TABLET | Freq: Once | ORAL | Status: AC
  Administered 2023-05-09: 1 via ORAL
  Filled 2023-05-09: qty 1

## 2023-05-09 NOTE — ED Provider Notes (Signed)
[place] EMERGENCY DEPARTMENT AT [facility] Provider Note   CSN: [id number] Arrival date & time: 05/08/23  1945     History {Add pertinent medical, surgical, social history, OB history to HPI:1} Chief Complaint  Patient presents with   Chest Pain    Tamara Church is a 47 y.o. female.  HPI     Home Medications Prior to Admission medications   Medication Sig Start Date End Date Taking? Authorizing Provider  amLODipine (NORVASC) 10 MG tablet Take 1 tablet (10 mg total) by mouth daily. 07/31/22   Valetta Close, MD  chlorhexidine (PERIDEX) 0.12 % solution Use as directed 15 mLs in the mouth or throat 2 (two) times daily. 02/23/23   Al Decant, PA-C  dicyclomine (BENTYL) 20 MG tablet Take 1 tablet (20 mg total) by mouth 2 (two) times daily for 7 days. 04/14/23 04/21/23  Sloan Leiter, DO  famotidine (PEPCID) 20 MG tablet Take 1 tablet (20 mg total) by mouth 2 (two) times daily. 03/28/23   Curatolo, Adam, DO  [name] (alum & mag hydroxide, lidocaine, dicyclomine) oral mixture Take 30 mls by mouth daily as needed  Swish and swallow 90 ml  Viscous Lisocaine, 90 ml Dicyclomine 10mg /5 ml, maalox 04/20/23   Meredith Pel, NP  levonorgestrel (MIRENA) 20 MCG/24HR IUD 1 each by Intrauterine route once.    [provider]  metoCLOPramide (REGLAN) 10 MG tablet Take 1 tablet (10 mg total) by mouth every 6 (six) hours as needed for nausea. 04/07/23   Mesner, Barbara Cower, MD  Na Sulfate-K Sulfate-Mg Sulf (SUPREP BOWEL PREP KIT) 17.5-3.13-1.6 GM/177ML SOLN Take 1 kit by mouth as directed. 04/20/23   Meredith Pel, NP  ondansetron (ZOFRAN) 4 MG tablet Take 1 tablet (4 mg total) by mouth every 8 (eight) hours as needed for nausea or vomiting. 11/30/22   Blue, Soijett A, PA-C  phenazopyridine (PYRIDIUM) 200 MG tablet Take 1 tablet (200 mg total) by mouth 3 (three) times daily as needed for pain (dysuria). 04/19/23   Merrilee Jansky, MD  RABEprazole (ACIPHEX) 20 MG  tablet Take 1 tablet (20 mg total) by mouth 2 (two) times daily. 12/31/22   Mansouraty, Netty Starring., MD  spironolactone (ALDACTONE) 25 MG tablet Take 2 tablets (50 mg total) by mouth at bedtime. 09/17/22   Valetta Close, MD  sucralfate (CARAFATE) 1 g tablet TAKE 1 TABLET BY MOUTH TWICE A DAY 03/26/23   Meredith Pel, NP      Allergies    Ace inhibitors, Angiotensin receptor blockers, and Hctz [hydrochlorothiazide]    Review of Systems   Review of Systems  Physical Exam Updated Vital Signs BP (!) 156/97   Pulse (!) 101   Temp 98.8 F (37.1 C)   Resp 18   Ht 5\' 2"  (1.575 m)   Wt 88.5 kg   LMP 05/08/2023 (Exact Date)   SpO2 100%   BMI 35.67 kg/m  Physical Exam  ED Results / Procedures / Treatments   Labs (all labs ordered are listed, but only abnormal results are displayed) Labs Reviewed  BASIC METABOLIC PANEL - Abnormal; Notable for the following components:      Result Value   Potassium 3.2 (*)    Glucose, Bld 126 (*)    All other components within normal limits  CBC - Abnormal; Notable for the following components:   Hemoglobin 11.9 (*)    HCT 35.4 (*)    All other components within normal limits  SARS CORONAVIRUS 2 BY RT PCR  PREGNANCY, URINE  TROPONIN I (HIGH SENSITIVITY)  TROPONIN I (HIGH SENSITIVITY)    EKG EKG Interpretation Date/Time:  Saturday May 08 2023 19:55:53 EDT Ventricular Rate:  93 PR Interval:  150 QRS Duration:  84 QT Interval:  354 QTC Calculation: 440 R Axis:   1  Text Interpretation: Normal sinus rhythm Cannot rule out Anterior infarct (cited on or before 02-Apr-2023) Abnormal ECG When compared with ECG of 06-May-2023 23:30, T wave inversion less evident in Inferior leads No significant change since last tracing Confirmed by Alvira Monday ([id number]) on 05/08/2023 9:11:09 PM  Radiology DG Chest Port 1 View  Result Date: 05/08/2023 CLINICAL DATA:  Chest pain EXAM: PORTABLE CHEST 1 VIEW COMPARISON:  Chest x-ray 05/06/2023 FINDINGS:  The heart size and mediastinal contours are within normal limits. Both lungs are clear. The visualized skeletal structures are unremarkable. IMPRESSION: No active disease. Electronically Signed   By: Darliss Cheney M.D.   On: 05/08/2023 20:36    Procedures Procedures  {Document cardiac monitor, telemetry assessment procedure when appropriate:1}  Medications Ordered in ED Medications  potassium chloride SA (KLOR-CON M) CR tablet 40 mEq (40 mEq Oral Given 05/08/23 2320)  lactated ringers bolus 1,000 mL (1,000 mLs Intravenous New Bag/Given 05/08/23 2324)    ED Course/ Medical Decision Making/ A&P   {   Click here for ABCD2, HEART and other calculatorsREFRESH Note before signing :1}                              Medical Decision Making Amount and/or Complexity of Data Reviewed Labs: ordered. Radiology: ordered.  Risk Prescription drug management.   ***  {Document critical care time when appropriate:1} {Document review of labs and clinical decision tools ie heart score, Chads2Vasc2 etc:1}  {Document your independent review of radiology images, and any outside records:1} {Document your discussion with family members, caretakers, and with consultants:1} {Document social determinants of health affecting pt's care:1} {Document your decision making why or why not admission, treatments were needed:1} Final Clinical Impression(s) / ED Diagnoses Final diagnoses:  None    Rx / DC Orders ED Discharge Orders     None

## 2023-05-10 ENCOUNTER — Other Ambulatory Visit: Payer: Self-pay

## 2023-05-10 MED ORDER — LIDOCAINE VISCOUS HCL 2 % MT SOLN: OROMUCOSAL | 0 refills | Status: DC

## 2023-05-11 ENCOUNTER — Telehealth: Payer: Self-pay

## 2023-05-11 DIAGNOSIS — H6690 Otitis media, unspecified, unspecified ear: Secondary | ICD-10-CM

## 2023-05-11 MED ORDER — AZITHROMYCIN 500 MG PO TABS
500.0000 mg | ORAL_TABLET | Freq: Every day | ORAL | 0 refills | Status: DC
Start: 2023-05-11 — End: 2023-05-17

## 2023-05-11 NOTE — Telephone Encounter (Signed)
Pt calling to ask for a different antibiotic. The one she got from the hospital is making her sick. Please let her know if we are sending in a new antibiotic and what we are sending. Please do this as soon as possible. Pt states she can not take this medicine any more. Tamara Church, CMA

## 2023-05-11 NOTE — Telephone Encounter (Signed)
Recently seen at the ED and diagnosed with double ear infection. She was given Augmentin which caused her severe nausea, vomiting and diarrhea.   Will trial a 5 day course of azithromycin. If patient is unable to tolerate, consider CTX IM injection.    Glendale Chard, DO [facility], PGY-2 05/11/23 1:43 PM

## 2023-05-12 ENCOUNTER — Other Ambulatory Visit: Payer: Self-pay

## 2023-05-12 ENCOUNTER — Telehealth: Payer: Self-pay

## 2023-05-12 MED ORDER — PROCHLORPERAZINE MALEATE 10 MG PO TABS: 10.0000 mg | ORAL_TABLET | Freq: Every day | ORAL | 0 refills | Status: DC | PRN

## 2023-05-12 NOTE — Telephone Encounter (Signed)
Called the patient to set up an appointment for her which will be 05/17/23 at 1:30 pm. The patient is on antibiotics for an inner ear infection. She was back in the ER on 05/07/23 and 05/08/23. Patient is taking [name] up to BID as directed by Dr Meridee Score. She was given compazine in July (#10). She has tried them for her stomach and find them to be helpful. She is asking if she cal have a refill.

## 2023-05-12 NOTE — Telephone Encounter (Signed)
Patient advised and expresses understanding. Pharmacy confirmed [facility]. Rx transmitted.

## 2023-05-12 NOTE — Telephone Encounter (Signed)
Patient agrees to this plan. Appointment 05/17/23 at 1:30 pm with Willette Cluster, NP

## 2023-05-17 ENCOUNTER — Encounter: Payer: Self-pay | Admitting: Nurse Practitioner

## 2023-05-17 ENCOUNTER — Ambulatory Visit (INDEPENDENT_AMBULATORY_CARE_PROVIDER_SITE_OTHER): Payer: Medicaid Other | Admitting: Nurse Practitioner

## 2023-05-17 VITALS — BP 110/78 | HR 84 | Ht 62.0 in | Wt 191.0 lb

## 2023-05-17 DIAGNOSIS — K219 Gastro-esophageal reflux disease without esophagitis: Secondary | ICD-10-CM

## 2023-05-17 DIAGNOSIS — R6881 Early satiety: Secondary | ICD-10-CM

## 2023-05-17 DIAGNOSIS — R197 Diarrhea, unspecified: Secondary | ICD-10-CM | POA: Diagnosis not present

## 2023-05-17 MED ORDER — DICYCLOMINE HCL 20 MG PO TABS: ORAL_TABLET | ORAL | 3 refills | Status: DC

## 2023-05-17 MED ORDER — METOCLOPRAMIDE HCL 5 MG PO TABS: 5.0000 mg | ORAL_TABLET | Freq: Three times a day (TID) | ORAL | 2 refills | Status: DC

## 2023-05-17 NOTE — Patient Instructions (Addendum)
_______________________________________________________  If your blood pressure at your visit was 140/90 or greater, please contact your primary care physician to follow up on this. _______________________________________________________  If you are age 46 or younger, your body mass index should be between 19-25. Your Body mass index is 34.93 kg/m. If this is out of the aformentioned range listed, please consider follow up with your Primary Care Provider.  ________________________________________________________  The [place] [facility] providers would like to encourage you to use [facility] to communicate with providers for non-urgent requests or questions.  Due to long hold times on the telephone, sending your provider a message by [facility] may be a faster and more efficient way to get a response.  Please allow 48 business hours for a response.  Please remember that this is for non-urgent requests.  _______________________________________________________  We have sent the following medications to your pharmacy for you to pick up at your convenience:  DECREASE: Reglan to 5mg  three times daily before meals CONTINUE: Bentyl as needed for chest pain or abdominal pain.  Please call our office in diarrhea does not subside in a few days.  Thank you for entrusting me with your care and choosing [facility].  Willette Cluster, NP

## 2023-05-17 NOTE — Progress Notes (Signed)
Primary GI: [name], MD  ASSESSMENT & PLAN   47 y.o.  female whose past medical history includes,  but is not necessarily limited to, HTN, GERD, hiatal hernia , Schatzki's ring , non-H. pylori related gastritis , cholelithiasis s/p cholecystectomy.   Chronic early satiety of unclear etiology. Has occasional Nausea / vomiting but overall those improved following cholecystectomy last year.  Etiology of chronic early satiety is not clear. Prior gastric emptying study was normal.   No findings to explains symptoms on EGD in 2021 or Dec 2023.  -- She is out of Reglan Rx. Will resume metoclopramide but try at a lower dose of 5 mg 30 minutes before meals 3 times daily.  --QT interval 354, QTc 440 --Discussed potential serious side effects of Reglan (Metoclopramide) such as uncontrolled spasms / movements of the face, neck, arms or legs. Monitor for involuntary lip smacking, movements of the tongue, and abnormal movements of the eyes. If any of these potentially serious side effect occur, stop the medication immediately.    GERD / mild esophagitis. She has chronic , intermittent, non-exertional chest pain despite high-dose PPI plus twice daily H2 blocker.  Seems unlikely related to acid reflux given the amount of acid suppression meds she takes.  Musculoskeletal pain? Functional chest pain?   Cardiac related?  -- She is already scheduled for 24-hour pH/impedance and manometry study.  The study will be done ON acid suppression medications.  Study is scheduled for December (soonest appointment we could get).  -- She has an appointment with Cardiology tomorrow (sounds like the ED referred her) -- For now continue twice daily Aciphex, twice daily famotidine.  [name] helped so she will continue that as needed (doesn't have to take everyday).  -- Will refill dicyclomine.  Though we previously prescribed this for abdominal pain , I encouraged her to try a dose next time she gets an episode of  chest pain  Involuntary weight loss. Weight down 10 pounds in one month. Presumably 2/2 to early satiety.  --See above  [name], mild. New.  Hemoglobin 11.9 in the ED on 05/08/2023.  Typically her hemoglobin runs in the low 12 range.  No overt GI bleeding -- Monitor for now.  Scheduled for a screening colonoscopy at the end of September  Diarrhea, acute.  Coincides with antibiotics taken for an ear infection.  Probably antibiotic associated diarrhea.  -- She completed antibiotics yesterday.  I asked her to contact our office if diarrhea does not resolve over the next few days  Colon cancer screening --She is scheduled for screening colonoscopy at the end of September   HPI   Brief GI history Tamara Church has been seen here and  in the emergency department numerous times for chronic upper abdominal pain,  chronic nausea vomiting , and chronic GERD.  For the most part the frequent nausea / vomiting resolved after cholecystectomy several months ago. She has continued to have frequent episodes of chest pain despite high dose PPI, BID H2 blocker and antireflux measures.  She was recently back in the ED for evaluation of chest pain.  EKG without acute ST changes.  No acute findings on chest x-ray.  Troponin was negative.  ED referred her for cardiology evaluation, she has an appointment tomorrow.   Tamara Church was last seen here 04/20/2023 for evaluation of ongoing chest pain.  We scheduled for 24-hour pH/impedance and manometry study but unfortunately cannot be done until December.  We also called in a [name] which  has previously helped when seen in the ED. since at last visit she has been back to the ED for chest pain.  She has an appoint with cardiology tomorrow  Chief complaint : [name]   Tamara Church has not had much chest pain in the last several days.  She remains on twice daily Aciphex, twice daily famotidine and takes a [name] as needed but does not needed every day.  Her main complaint is  that of early satiety.  She feels full after only a couple of bites of food.  She is no longer taking metoclopramide (ran out of the prescription).  Her weight is down from 201 pounds late July to 191 pounds. today   Previous GI Endoscopies / Labs / Imaging   Not complete list all endoscopic evaluations   Aug 2021 EGD - No gross lesions in esophagus. Biopsied for EoE/LoE. - Z-line irregular, 36 cm from the incisors. - Erythematous mucosa in the gastric body. No other gross lesions in the stomach. Biopsied. - No gross lesions in the duodenal bulb, in the first portion of the duodenum and in the second portion of the duodenum. Biopsied.  Diagnosis 1. Surgical [P], duodenum - DUODENAL MUCOSA WITH NO SPECIFIC HISTOPATHOLOGIC CHANGES - NEGATIVE FOR INCREASED INTRAEPITHELIAL LYMPHOCYTES OR VILLOUS ARCHITECTURAL CHANGES 2. Surgical [P], random gastric sites - GASTRIC ANTRAL MUCOSA WITH MILD NONSPECIFIC REACTIVE GASTROPATHY - GASTRIC OXYNTIC MUCOSA WITH NO SPECIFIC HISTOPATHOLOGIC CHANGES - WARTHIN STARRY STAIN IS NEGATIVE FOR HELICOBACTER PYLORI 3. Surgical [P], random esophageal sites - ESOPHAGEAL SQUAMOUS MUCOSA WITH MILD VASCULAR CONGESTION, AND FOCAL SQUAMOUS BALLOONING, SUGGESTIVE OF MILD REFLUX ESOPHAGITIS - NEGATIVE FOR INCREASED INTRAEPITHELIAL EOSINOPHILS  EGD 08/29/22 for N/V and upper abdominal pain -No gross lesions in the entire esophagus.  A widely patent Schatzki's ring was found at the GE junction.  The Z-line was regular and found at 31 cm from the incisors.  A 4 cm hiatal hernia was present.  Segmental mild mucosal changes characterized by congestion, erythema and altered texture were found in the antrum and prepyloric region .  No gross lesions in the duodenal bulb, and the first portion of the duodenum and in the second portion of the duodenum   Diagnosis 1. Surgical [P], gastric - GASTRIC ANTRAL AND OXYNTIC MUCOSA WITH FEATURES OF REACTIVE GASTROPATHY - NEGATIVE FOR H.  PYLORI ON H&E STAIN - NEGATIVE FOR INTESTINAL METAPLASIA OR MALIGNANCY 2. Surgical [P], duodenal - BENIGN SMALL BOWEL MUCOSA WITH NO SIGNIFICANT PATHOLOGIC CHANGES        Latest Ref Rng & Units 04/14/2023   10:32 PM 04/02/2023    8:07 PM 12/02/2022   10:57 AM  Hepatic Function  Total Protein 6.5 - 8.1 g/dL 8.7  8.3  8.8   Albumin 3.5 - 5.0 g/dL 4.6  4.5  4.6   AST 15 - 41 U/L 13  15  15    ALT 0 - 44 U/L 12  54  17   Alk Phosphatase 38 - 126 U/L 48  57  54   Total Bilirubin 0.3 - 1.2 mg/dL 0.5  0.3  0.5        Latest Ref Rng & Units 05/08/2023    8:17 PM 05/06/2023    8:02 PM 04/14/2023   10:32 PM  CBC  WBC 4.0 - 10.5 K/uL 8.3  6.0  6.7   Hemoglobin 12.0 - 15.0 g/dL 16.1  09.6  04.5   Hematocrit 36.0 - 46.0 % 35.4  37.9  36.9  Platelets 150 - 400 K/uL 260  271  269      Past Medical History:  Diagnosis Date   Abdominal pain, epigastric 07/27/2020   Anemia    ANEMIA 09/07/2008   Qualifier: Diagnosis of  By: Earnest Bailey MD, Kim     Bacterial vaginosis 05/08/2020   Calculus of gallbladder without cholecystitis without obstruction 07/05/2021   Constipation 05/18/2020   Early satiety 05/18/2020   Gastritis and gastroduodenitis 07/27/2020   Palpitations 03/22/2020   Prolonged QT interval 10/04/2020   Screening-pulmonary TB 10/08/2021   Sore throat 09/18/2020   Unintentional weight loss 05/18/2020   UTI (urinary tract infection) 04/23/2020    Past Surgical History:  Procedure Laterality Date   NO PAST SURGERIES     UPPER GASTROINTESTINAL ENDOSCOPY      Family History  Problem Relation Age of Onset   Hypertension Mother    Colon cancer Neg Hx    Esophageal cancer Neg Hx    Rectal cancer Neg Hx    Stomach cancer Neg Hx    Inflammatory bowel disease Neg Hx    Liver disease Neg Hx    Pancreatic cancer Neg Hx     Current Medications, Allergies, Family History and Social History were reviewed in [employer] record.     Current Outpatient  Medications  Medication Sig Dispense Refill   amLODipine (NORVASC) 10 MG tablet Take 1 tablet (10 mg total) by mouth daily. 90 tablet 3   amoxicillin-clavulanate (AUGMENTIN) 875-125 MG tablet Take 1 tablet by mouth every 12 (twelve) hours. 14 tablet 0   azithromycin (ZITHROMAX) 500 MG tablet Take 1 tablet (500 mg total) by mouth daily. 5 tablet 0   chlorhexidine (PERIDEX) 0.12 % solution Use as directed 15 mLs in the mouth or throat 2 (two) times daily. 120 mL 0   dicyclomine (BENTYL) 20 MG tablet Take 1 tablet (20 mg total) by mouth 2 (two) times daily for 7 days. 14 tablet 0   famotidine (PEPCID) 20 MG tablet Take 1 tablet (20 mg total) by mouth 2 (two) times daily. 30 tablet 0   [name] (alum & mag hydroxide, lidocaine, dicyclomine) oral mixture Take 30 mls by mouth twice daily  Swish and swallow 90 ml  Viscous Lisocaine, 90 ml Dicyclomine 10mg /5 ml, 270 ml maalox 550 mL 0   levonorgestrel (MIRENA) 20 MCG/24HR IUD 1 each by Intrauterine route once.     metoCLOPramide (REGLAN) 10 MG tablet Take 1 tablet (10 mg total) by mouth every 6 (six) hours as needed for nausea. 30 tablet 0   Na Sulfate-K Sulfate-Mg Sulf (SUPREP BOWEL PREP KIT) 17.5-3.13-1.6 GM/177ML SOLN Take 1 kit by mouth as directed. 324 mL 0   ondansetron (ZOFRAN) 4 MG tablet Take 1 tablet (4 mg total) by mouth every 8 (eight) hours as needed for nausea or vomiting. 12 tablet 0   phenazopyridine (PYRIDIUM) 200 MG tablet Take 1 tablet (200 mg total) by mouth 3 (three) times daily as needed for pain (dysuria). 6 tablet 0   prochlorperazine (COMPAZINE) 10 MG tablet Take 1 tablet (10 mg total) by mouth daily as needed for nausea or vomiting. 10 tablet 0   RABEprazole (ACIPHEX) 20 MG tablet Take 1 tablet (20 mg total) by mouth 2 (two) times daily. 60 tablet 6   spironolactone (ALDACTONE) 25 MG tablet Take 2 tablets (50 mg total) by mouth at bedtime. 180 tablet 3   sucralfate (CARAFATE) 1 g tablet TAKE 1 TABLET BY MOUTH TWICE A DAY  60  tablet 0   No current facility-administered medications for this visit.    Review of Systems: No chest pain. No shortness of breath. No urinary complaints.    Physical Exam  Wt Readings from Last 3 Encounters:  05/17/23 191 lb (86.6 kg)  05/08/23 195 lb (88.5 kg)  05/06/23 195 lb (88.5 kg)    Ht 5\' 2"  (1.575 m) Comment: height measured without shoies  Wt 191 lb (86.6 kg)   LMP 05/08/2023 (Exact Date)   BMI 34.93 kg/m  Constitutional:  Pleasant, generally well appearing female in no acute distress. Psychiatric: Normal mood and affect. Behavior is normal. EENT: Pupils normal.  Conjunctivae are normal. No scleral icterus. Neck supple.  Cardiovascular: Normal rate, regular rhythm.  Pulmonary/chest: Effort normal and breath sounds normal. No wheezing, rales or rhonchi. Abdominal: Soft, nondistended, nontender. Bowel sounds active throughout. There are no masses palpable. No hepatomegaly. Neurological: Alert and oriented to person place and time.   Tamara Cluster, NP  05/17/2023, 1:30 PM

## 2023-05-17 NOTE — Progress Notes (Signed)
Attending Physician's Attestation   I have reviewed the chart.   I agree with the Advanced Practitioner's note, impression, and recommendations with any updates as below.    Gabriel Mansouraty, MD [facility] Office # [phone]  

## 2023-05-17 NOTE — Progress Notes (Unsigned)
  Cardiology Office Note:  .   Date:  05/18/2023  ID:  [name], DOB 04-13-1976, MRN [id number] PCP: Glendale Chard, DO  [facility] Providers Cardiologist:  None {   History of Present Illness: .   Tamara Church is a 47 y.o. female with a past medical history of hypertension, anemia, prolonged QTc (600) E back in 08/2020 here for overdue follow-up appointment.  Was last seen in 2022 and was doing well at that time.  Monitor has been ordered for further evaluation.  No interval hospital and ED visits.  At that time she denied chest pain/pressure, SOB, DOE, PND/orthopnea, weight gain, leg swelling, palpitations, and syncope.  Amatory pressure was 125 systolic.  Today, she tells me that she was feeling bad at her recent ER visit and her chest was hurting.  She thinks it possibly was due to acid reflux.  She had not eaten much over the last few days and had not drank much either.  Possibly a component of dehydration.  She had flat troponins and EKG was unremarkable.  I do not think any further ischemic workup is indicated at this time.  She denies shortness of breath, swelling in her legs.  She felt fine prior to being sick on her stomach.  She was given Compazine by her GI doctor and she is also on a few other GI related medications to help  Reports no shortness of breath nor dyspnea on exertion. Reports no chest pain, pressure, or tightness. No edema, orthopnea, PND. Reports no palpitations.    ROS: Pertinent ROS in HPI  Studies Reviewed: .        Cardiac Event Monitoring: Date: 11/12/20 Results: Patient had a minimum heart rate of 60 bpm, maximum heart rate of 167 bpm, and average heart rate of 87 bpm. Predominant underlying rhythm was sinus rhythm. Isolated PACs were rare (<1.0%), with rare couplets. Isolated PVCs were rare (<1.0%). No evidence of complete heart block; though first degree heart block was noted. Triggered and diary events associated with sinus  tachycardia.   No malignant arrhythmias.     Physical Exam:   VS:  BP 110/76   Pulse (!) 58   Ht 5\' 2"  (1.575 m)   Wt 190 lb 12.8 oz (86.5 kg)   LMP 05/08/2023 (Exact Date)   BMI 34.90 kg/m    Wt Readings from Last 3 Encounters:  05/18/23 190 lb 12.8 oz (86.5 kg)  05/17/23 191 lb (86.6 kg)  05/08/23 195 lb (88.5 kg)    GEN: Well nourished, well developed in no acute distress NECK: No JVD; No carotid bruits CARDIAC: RRR, no murmurs, rubs, gallops RESPIRATORY:  Clear to auscultation without rales, wheezing or rhonchi  ABDOMEN: Soft, non-tender, non-distended EXTREMITIES:  No edema; No deformity   ASSESSMENT AND PLAN: .   1.  Prolonged Qtc -Looks a lot better on EKG  2.  Palpitations -no issues recently  3. Anxiety/chest pain -Chest pain was thought to be GI related, does not seem cardiac per patient report -Would recommend treatment of her anxiety through her primary care since this can exacerbate symptoms -Would recommend continuing current medications including amlodipine 10 mg daily, Aldactone 50 mg at bedtime      Dispo: She can follow-up in 6 months with Dr. Izora Ribas  Signed, Sharlene Dory, PA-C

## 2023-05-18 ENCOUNTER — Encounter: Payer: Self-pay | Admitting: Physician Assistant

## 2023-05-18 ENCOUNTER — Ambulatory Visit: Payer: Medicaid Other | Attending: Physician Assistant | Admitting: Physician Assistant

## 2023-05-18 VITALS — BP 110/76 | HR 58 | Ht 62.0 in | Wt 190.8 lb

## 2023-05-18 DIAGNOSIS — R9431 Abnormal electrocardiogram [ECG] [EKG]: Secondary | ICD-10-CM

## 2023-05-18 DIAGNOSIS — R002 Palpitations: Secondary | ICD-10-CM | POA: Diagnosis not present

## 2023-05-18 DIAGNOSIS — F419 Anxiety disorder, unspecified: Secondary | ICD-10-CM

## 2023-05-18 DIAGNOSIS — R079 Chest pain, unspecified: Secondary | ICD-10-CM | POA: Diagnosis not present

## 2023-05-18 NOTE — Patient Instructions (Signed)
Medication Instructions:  Your physician recommends that you continue on your current medications as directed. Please refer to the Current Medication list given to you today.  *If you need a refill on your cardiac medications before your next appointment, please call your pharmacy*  Lab Work: BNP, MAG-TODAY If you have labs (blood work) drawn today and your tests are completely normal, you will receive your results only by: MyChart Message (if you have MyChart) OR A paper copy in the mail If you have any lab test that is abnormal or we need to change your treatment, we will call you to review the results.  Follow-Up: At [facility], you and your health needs are our priority.  As part of our continuing mission to provide you with exceptional heart care, we have created designated Provider Care Teams.  These Care Teams include your primary Cardiologist (physician) and Advanced Practice Providers (APPs -  Physician Assistants and Nurse Practitioners) who all work together to provide you with the care you need, when you need it.  We recommend signing up for the patient portal called "MyChart".  Sign up information is provided on this After Visit Summary.  MyChart is used to connect with patients for Virtual Visits (Telemedicine).  Patients are able to view lab/test results, encounter notes, upcoming appointments, etc.  Non-urgent messages can be sent to your provider as well.   To learn more about what you can do with MyChart, go to [email or web address].    Your next appointment:   6 month(s)  Provider:   Dr Izora Ribas  Low-Sodium Eating Plan Salt (sodium) helps you keep a healthy balance of fluids in your body. Too much sodium can raise your blood pressure. It can also cause fluid and waste to be held in your body. Your health care provider or dietitian may recommend a low-sodium eating plan if you have high blood pressure (hypertension), kidney disease, liver disease, or heart  failure. Eating less sodium can help lower your blood pressure and reduce swelling. It can also protect your heart, liver, and kidneys. What are tips for following this plan? Reading food labels  Check food labels for the amount of sodium per serving. If you eat more than one serving, you must multiply the listed amount by the number of servings. Choose foods with less than 140 milligrams (mg) of sodium per serving. Avoid foods with 300 mg of sodium or more per serving. Always check how much sodium is in a product, even if the label says "unsalted" or "no salt added." Shopping  Buy products labeled as "low-sodium" or "no salt added." Buy fresh foods. Avoid canned foods and pre-made or frozen meals. Avoid canned, cured, or processed meats. Buy breads that have less than 80 mg of sodium per slice. Cooking  Eat more home-cooked food. Try to eat less restaurant, buffet, and fast food. Try not to add salt when you cook. Use salt-free seasonings or herbs instead of table salt or sea salt. Check with your provider or pharmacist before using salt substitutes. Cook with plant-based oils, such as canola, sunflower, or olive oil. Meal planning When eating at a restaurant, ask if your food can be made with less salt or no salt. Avoid dishes labeled as brined, pickled, cured, or smoked. Avoid dishes made with soy sauce, miso, or teriyaki sauce. Avoid foods that have monosodium glutamate (MSG) in them. MSG may be added to some restaurant food, sauces, soups, bouillon, and canned foods. Make meals that can be grilled, baked,  poached, roasted, or steamed. These are often made with less sodium. General information Try to limit your sodium intake to 1,500-2,300 mg each day, or the amount told by your provider. What foods should I eat? Fruits Fresh, frozen, or canned fruit. Fruit juice. Vegetables Fresh or frozen vegetables. "No salt added" canned vegetables. "No salt added" tomato sauce and paste.  Low-sodium or reduced-sodium tomato and vegetable juice. Grains Low-sodium cereals, such as oats, puffed wheat and rice, and shredded wheat. Low-sodium crackers. Unsalted rice. Unsalted pasta. Low-sodium bread. Whole grain breads and whole grain pasta. Meats and other proteins Fresh or frozen meat, poultry, seafood, and fish. These should have no added salt. Low-sodium canned tuna and salmon. Unsalted nuts. Dried peas, beans, and lentils without added salt. Unsalted canned beans. Eggs. Unsalted nut butters. Dairy Milk. Soy milk. Cheese that is naturally low in sodium, such as ricotta cheese, fresh mozzarella, or Swiss cheese. Low-sodium or reduced-sodium cheese. Cream cheese. Yogurt. Seasonings and condiments Fresh and dried herbs and spices. Salt-free seasonings. Low-sodium mustard and ketchup. Sodium-free salad dressing. Sodium-free light mayonnaise. Fresh or refrigerated horseradish. Lemon juice. Vinegar. Other foods Homemade, reduced-sodium, or low-sodium soups. Unsalted popcorn and pretzels. Low-salt or salt-free chips. The items listed above may not be all the foods and drinks you can have. Talk to a dietitian to learn more. What foods should I avoid? Vegetables Sauerkraut, pickled vegetables, and relishes. Olives. [place] fries. Onion rings. Regular canned vegetables, except low-sodium or reduced-sodium items. Regular canned tomato sauce and paste. Regular tomato and vegetable juice. Frozen vegetables in sauces. Grains Instant hot cereals. Bread stuffing, pancake, and biscuit mixes. Croutons. Seasoned rice or pasta mixes. Noodle soup cups. Boxed or frozen macaroni and cheese. Regular salted crackers. Self-rising flour. Meats and other proteins Meat or fish that is salted, canned, smoked, spiced, or pickled. Precooked or cured meat, such as sausages or meat loaves. Tomasa Blase. Ham. Pepperoni. Hot dogs. Corned beef. Chipped beef. Salt pork. Jerky. Pickled herring, anchovies, and sardines. Regular  canned tuna. Salted nuts. Dairy Processed cheese and cheese spreads. Hard cheeses. Cheese curds. Blue cheese. Feta cheese. String cheese. Regular cottage cheese. Buttermilk. Canned milk. Fats and oils Salted butter. Regular margarine. Ghee. Bacon fat. Seasonings and condiments Onion salt, garlic salt, seasoned salt, table salt, and sea salt. Canned and packaged gravies. Worcestershire sauce. Tartar sauce. Barbecue sauce. Teriyaki sauce. Soy sauce, including reduced-sodium soy sauce. Steak sauce. Fish sauce. Oyster sauce. Cocktail sauce. Horseradish that you find on the shelf. Regular ketchup and mustard. Meat flavorings and tenderizers. Bouillon cubes. Hot sauce. Pre-made or packaged marinades. Pre-made or packaged taco seasonings. Relishes. Regular salad dressings. Salsa. Other foods Salted popcorn and pretzels. Corn chips and puffs. Potato and tortilla chips. Canned or dried soups. Pizza. Frozen entrees and pot pies. The items listed above may not be all the foods and drinks you should avoid. Talk to a dietitian to learn more. This information is not intended to replace advice given to you by your health care provider. Make sure you discuss any questions you have with your health care provider. Document Revised: 09/24/2022 Document Reviewed: 09/24/2022 Elsevier Patient Education  2024 [employer] Heart-Healthy Eating Plan Many factors influence your heart health, including eating and exercise habits. Heart health is also called coronary health. Coronary risk increases with abnormal blood fat (lipid) levels. A heart-healthy eating plan includes limiting unhealthy fats, increasing healthy fats, limiting salt (sodium) intake, and making other diet and lifestyle changes. What is my plan? Your health care provider may  recommend that: You limit your fat intake to _________% or less of your total calories each day. You limit your saturated fat intake to _________% or less of your total calories each  day. You limit the amount of cholesterol in your diet to less than _________ mg per day. You limit the amount of sodium in your diet to less than _________ mg per day. What are tips for following this plan? Cooking Cook foods using methods other than frying. Baking, boiling, grilling, and broiling are all good options. Other ways to reduce fat include: Removing the skin from poultry. Removing all visible fats from meats. Steaming vegetables in water or broth. Meal planning  At meals, imagine dividing your plate into fourths: Fill one-half of your plate with vegetables and green salads. Fill one-fourth of your plate with whole grains. Fill one-fourth of your plate with lean protein foods. Eat 2-4 cups of vegetables per day. One cup of vegetables equals 1 cup (91 g) broccoli or cauliflower florets, 2 medium carrots, 1 large bell pepper, 1 large sweet potato, 1 large tomato, 1 medium white potato, 2 cups (150 g) raw leafy greens. Eat 1-2 cups of fruit per day. One cup of fruit equals 1 small apple, 1 large banana, 1 cup (237 g) mixed fruit, 1 large orange,  cup (82 g) dried fruit, 1 cup (240 mL) 100% fruit juice. Eat more foods that contain soluble fiber. Examples include apples, broccoli, carrots, beans, peas, and barley. Aim to get 25-30 g of fiber per day. Increase your consumption of legumes, nuts, and seeds to 4-5 servings per week. One serving of dried beans or legumes equals  cup (90 g) cooked, 1 serving of nuts is  oz (12 almonds, 24 pistachios, or 7 walnut halves), and 1 serving of seeds equals  oz (8 g). Fats Choose healthy fats more often. Choose monounsaturated and polyunsaturated fats, such as olive and canola oils, avocado oil, flaxseeds, walnuts, almonds, and seeds. Eat more omega-3 fats. Choose salmon, mackerel, sardines, tuna, flaxseed oil, and ground flaxseeds. Aim to eat fish at least 2 times each week. Check food labels carefully to identify foods with trans fats or high  amounts of saturated fat. Limit saturated fats. These are found in animal products, such as meats, butter, and cream. Plant sources of saturated fats include palm oil, palm kernel oil, and coconut oil. Avoid foods with partially hydrogenated oils in them. These contain trans fats. Examples are stick margarine, some tub margarines, cookies, crackers, and other baked goods. Avoid fried foods. General information Eat more home-cooked food and less restaurant, buffet, and fast food. Limit or avoid alcohol. Limit foods that are high in added sugar and simple starches such as foods made using white refined flour (white breads, pastries, sweets). Lose weight if you are overweight. Losing just 5-10% of your body weight can help your overall health and prevent diseases such as diabetes and heart disease. Monitor your sodium intake, especially if you have high blood pressure. Talk with your health care provider about your sodium intake. Try to incorporate more vegetarian meals weekly. What foods should I eat? Fruits All fresh, canned (in natural juice), or frozen fruits. Vegetables Fresh or frozen vegetables (raw, steamed, roasted, or grilled). Green salads. Grains Most grains. Choose whole wheat and whole grains most of the time. Rice and pasta, including brown rice and pastas made with whole wheat. Meats and other proteins Lean, well-trimmed beef, veal, pork, and lamb. Chicken and [place] without skin. All fish and  shellfish. Wild duck, rabbit, pheasant, and venison. Egg whites or low-cholesterol egg substitutes. Dried beans, peas, lentils, and tofu. Seeds and most nuts. Dairy Low-fat or nonfat cheeses, including ricotta and mozzarella. Skim or 1% milk (liquid, powdered, or evaporated). Buttermilk made with low-fat milk. Nonfat or low-fat yogurt. Fats and oils Non-hydrogenated (trans-free) margarines. Vegetable oils, including soybean, sesame, sunflower, olive, avocado, peanut, safflower, corn, canola,  and cottonseed. Salad dressings or mayonnaise made with a vegetable oil. Beverages Water (mineral or sparkling). Coffee and tea. Unsweetened ice tea. Diet beverages. Sweets and desserts Sherbet, gelatin, and fruit ice. Small amounts of dark chocolate. Limit all sweets and desserts. Seasonings and condiments All seasonings and condiments. The items listed above may not be a complete list of foods and beverages you can eat. Contact a dietitian for more options. What foods should I avoid? Fruits Canned fruit in heavy syrup. Fruit in cream or butter sauce. Fried fruit. Limit coconut. Vegetables Vegetables cooked in cheese, cream, or butter sauce. Fried vegetables. Grains Breads made with saturated or trans fats, oils, or whole milk. Croissants. Sweet rolls. Donuts. High-fat crackers, such as cheese crackers and chips. Meats and other proteins Fatty meats, such as hot dogs, ribs, sausage, bacon, rib-eye roast or steak. High-fat deli meats, such as salami and bologna. Caviar. Domestic duck and goose. Organ meats, such as liver. Dairy Cream, sour cream, cream cheese, and creamed cottage cheese. Whole-milk cheeses. Whole or 2% milk (liquid, evaporated, or condensed). Whole buttermilk. Cream sauce or high-fat cheese sauce. Whole-milk yogurt. Fats and oils Meat fat, or shortening. Cocoa butter, hydrogenated oils, palm oil, coconut oil, palm kernel oil. Solid fats and shortenings, including bacon fat, salt pork, lard, and butter. Nondairy cream substitutes. Salad dressings with cheese or sour cream. Beverages Regular sodas and any drinks with added sugar. Sweets and desserts Frosting. Pudding. Cookies. Cakes. Pies. Milk chocolate or white chocolate. Buttered syrups. Full-fat ice cream or ice cream drinks. The items listed above may not be a complete list of foods and beverages to avoid. Contact a dietitian for more information. Summary Heart-healthy meal planning includes limiting unhealthy fats,  increasing healthy fats, limiting salt (sodium) intake and making other diet and lifestyle changes. Lose weight if you are overweight. Losing just 5-10% of your body weight can help your overall health and prevent diseases such as diabetes and heart disease. Focus on eating a balance of foods, including fruits and vegetables, low-fat or nonfat dairy, lean protein, nuts and legumes, whole grains, and heart-healthy oils and fats. This information is not intended to replace advice given to you by your health care provider. Make sure you discuss any questions you have with your health care provider. Document Revised: 10/13/2021 Document Reviewed: 10/13/2021 Elsevier Patient Education  2024 [employer].

## 2023-05-19 LAB — MAGNESIUM: Magnesium: 2.3 mg/dL (ref 1.6–2.3)

## 2023-05-19 LAB — PRO B NATRIURETIC PEPTIDE: NT-Pro BNP: <36 pg/mL (ref 0–249)

## 2023-05-20 ENCOUNTER — Emergency Department (HOSPITAL_BASED_OUTPATIENT_CLINIC_OR_DEPARTMENT_OTHER)
Admission: EM | Admit: 2023-05-20 | Discharge: 2023-05-20 | Disposition: A | Discharge status: Home / Self Care | Payer: Medicaid Other | Attending: Emergency Medicine | Admitting: Emergency Medicine

## 2023-05-20 ENCOUNTER — Other Ambulatory Visit (HOSPITAL_BASED_OUTPATIENT_CLINIC_OR_DEPARTMENT_OTHER): Payer: Self-pay

## 2023-05-20 ENCOUNTER — Other Ambulatory Visit: Payer: Self-pay

## 2023-05-20 ENCOUNTER — Encounter (HOSPITAL_BASED_OUTPATIENT_CLINIC_OR_DEPARTMENT_OTHER): Payer: Self-pay | Admitting: Emergency Medicine

## 2023-05-20 DIAGNOSIS — R109 Unspecified abdominal pain: Secondary | ICD-10-CM | POA: Diagnosis not present

## 2023-05-20 DIAGNOSIS — R197 Diarrhea, unspecified: Secondary | ICD-10-CM | POA: Insufficient documentation

## 2023-05-20 LAB — COMPREHENSIVE METABOLIC PANEL
ALT: 16 U/L (ref 0–44)
AST: 14 U/L — ABNORMAL LOW (ref 15–41)
Albumin: 4.6 g/dL (ref 3.5–5.0)
Alkaline Phosphatase: 47 U/L (ref 38–126)
Anion gap: 11 (ref 5–15)
BUN: 8 mg/dL (ref 6–20)
CO2: 26 mmol/L (ref 22–32)
Calcium: 10.2 mg/dL (ref 8.9–10.3)
Chloride: 100 mmol/L (ref 98–111)
Creatinine, Ser: 0.97 mg/dL (ref 0.44–1.00)
GFR, Estimated: >60 mL/min (ref >60)
Glucose, Bld: 91 mg/dL (ref 70–99)
Potassium: 3.7 mmol/L (ref 3.5–5.1)
Sodium: 137 mmol/L (ref 135–145)
Total Bilirubin: 0.7 mg/dL (ref 0.3–1.2)
Total Protein: 8.5 g/dL — ABNORMAL HIGH (ref 6.5–8.1)

## 2023-05-20 LAB — CBC
HCT: 37.3 % (ref 36.0–46.0)
Hemoglobin: 12.6 g/dL (ref 12.0–15.0)
MCH: 29.3 pg (ref 26.0–34.0)
MCHC: 33.8 g/dL (ref 30.0–36.0)
MCV: 86.7 fL (ref 80.0–100.0)
Platelets: 250 10*3/uL (ref 150–400)
RBC: 4.3 MIL/uL (ref 3.87–5.11)
RDW: 13.3 % (ref 11.5–15.5)
WBC: 6.4 10*3/uL (ref 4.0–10.5)
nRBC: 0 % (ref 0.0–0.2)

## 2023-05-20 LAB — URINALYSIS, ROUTINE W REFLEX MICROSCOPIC
Bilirubin Urine: NEGATIVE
Glucose, UA: NEGATIVE mg/dL
Hgb urine dipstick: NEGATIVE
Ketones, ur: 15 mg/dL — AB
Leukocytes,Ua: NEGATIVE
Nitrite: NEGATIVE
Protein, ur: NEGATIVE mg/dL
Specific Gravity, Urine: 1.005 (ref 1.005–1.030)
pH: 7 (ref 5.0–8.0)

## 2023-05-20 LAB — PREGNANCY, URINE: Preg Test, Ur: NEGATIVE

## 2023-05-20 LAB — C DIFFICILE QUICK SCREEN W PCR REFLEX
C Diff antigen: NEGATIVE
C Diff interpretation: NOT DETECTED
C Diff toxin: NEGATIVE

## 2023-05-20 LAB — LIPASE, BLOOD: Lipase: 22 U/L (ref 11–51)

## 2023-05-20 MED ORDER — DICYCLOMINE HCL 20 MG PO TABS
20.0000 mg | ORAL_TABLET | Freq: Two times a day (BID) | ORAL | 0 refills | Status: DC
  Filled 2023-05-20 (×2): qty 14, 7d supply, fill #0

## 2023-05-20 MED ORDER — SODIUM CHLORIDE 0.9 % IV SOLN: Freq: Once | INTRAVENOUS | Status: AC

## 2023-05-20 MED ORDER — ONDANSETRON HCL 4 MG/2ML IJ SOLN
4.0000 mg | Freq: Once | INTRAMUSCULAR | Status: AC | PRN
  Administered 2023-05-20: 4 mg via INTRAVENOUS
  Filled 2023-05-20: qty 2

## 2023-05-20 MED ORDER — DICYCLOMINE HCL 10 MG PO CAPS
10.0000 mg | ORAL_CAPSULE | Freq: Once | ORAL | Status: AC
  Administered 2023-05-20: 10 mg via ORAL
  Filled 2023-05-20: qty 1

## 2023-05-20 NOTE — ED Triage Notes (Signed)
Pt has been on antibiotic therapy for ear infection and before that had a UTI. Pt has had "sick" feeling to her abdomen since Monday and she has had diarrhea, not being able to eat/drink. She saw MD Monday who stated she might have upset stomach from the antibiotics but no orders.

## 2023-05-20 NOTE — Discharge Instructions (Addendum)
Your lab results here look reassuring.  You have been prescribed Bentyl from your GI provider to help with your diarrhea and abdominal pain. It has been sent to the [facility] pharmacy on [address] [place].  Please pick this up and begin take this as prescribed  If you have a positive result on your stool sample, you will be contacted about next steps.  You will also be able to see the result in your MyChart portal  Continue to eat and drink even if it makes you go to the bathroom.  You may use the metoclopramide that you are GI provider prescribed for nausea.  Please follow up with your GI provider Willette Cluster NP, within the next week regarding your diarrhea.   Return to the ER if you have significant abdominal pain, fever, any other new or concerning symptoms.

## 2023-05-20 NOTE — ED Provider Notes (Signed)
[place] EMERGENCY DEPARTMENT AT [facility] Provider Note   CSN: [id number] Arrival date & time: 05/20/23  1354     History  Chief Complaint  Patient presents with   Abdominal Pain    Tamara Church is a 47 y.o. female with history of GERD, cholecystectomy, presents with concern for diarrhea for the past 2 weeks.  She notes she was started on a unknown antibiotic for UTI 2 weeks ago and then developed diarrhea.  She has approximately 3-4 episodes of diarrhea per day.  It is nonbloody and brown.  She denies any fevers or chills.  She has not been taking her Reglan or Bentyl prescribed by her GI doctor 3 days ago.  Reports that the food she eats goes right through her, no vomiting.  Denies any recent travel.    Abdominal Pain      Home Medications Prior to Admission medications   Medication Sig Start Date End Date Taking? Authorizing Provider  amLODipine (NORVASC) 10 MG tablet Take 1 tablet (10 mg total) by mouth daily. 07/31/22   Valetta Close, MD  chlorhexidine (PERIDEX) 0.12 % solution Use as directed 15 mLs in the mouth or throat 2 (two) times daily. 02/23/23   Al Decant, PA-C  dicyclomine (BENTYL) 20 MG tablet Take 1 tablet daily as needed for chest or abdominal pain 05/17/23   Meredith Pel, NP  famotidine (PEPCID) 20 MG tablet Take 1 tablet (20 mg total) by mouth 2 (two) times daily. 03/28/23   Curatolo, Adam, DO  [name] (alum & mag hydroxide, lidocaine, dicyclomine) oral mixture Take 30 mls by mouth twice daily  Swish and swallow 90 ml  Viscous Lisocaine, 90 ml Dicyclomine 10mg /5 ml, 270 ml maalox 05/10/23   Meredith Pel, NP  levonorgestrel (MIRENA) 20 MCG/24HR IUD 1 each by Intrauterine route once.    [provider]  metoCLOPramide (REGLAN) 5 MG tablet Take 1 tablet (5 mg total) by mouth 3 (three) times daily before meals. 05/17/23   Meredith Pel, NP  phenazopyridine (PYRIDIUM) 200 MG tablet Take 1 tablet (200 mg total) by  mouth 3 (three) times daily as needed for pain (dysuria). 04/19/23   LampteyBritta Mccreedy, MD  prochlorperazine (COMPAZINE) 10 MG tablet Take 1 tablet (10 mg total) by mouth daily as needed for nausea or vomiting. 05/12/23   Meredith Pel, NP  RABEprazole (ACIPHEX) 20 MG tablet Take 20 mg by mouth 2 (two) times daily.    [provider]  spironolactone (ALDACTONE) 25 MG tablet Take 2 tablets (50 mg total) by mouth at bedtime. 09/17/22   Valetta Close, MD      Allergies    Ace inhibitors, Angiotensin receptor blockers, and Hctz [hydrochlorothiazide]    Review of Systems   Review of Systems  Gastrointestinal:  Positive for abdominal pain.    Physical Exam Updated Vital Signs BP 116/80   Pulse 87   Temp 98.4 F (36.9 C)   Resp 17   LMP 05/08/2023 (Exact Date)   SpO2 100%  Physical Exam Vitals and nursing note reviewed.  Constitutional:      General: She is not in acute distress.    Appearance: She is well-developed.     Comments: Comfortable appearing in bed, no active vomiting  HENT:     Head: Normocephalic and atraumatic.  Eyes:     Conjunctiva/sclera: Conjunctivae normal.  Cardiovascular:     Rate and Rhythm: Normal rate and regular rhythm.  Heart sounds: No murmur heard. Pulmonary:     Effort: Pulmonary effort is normal. No respiratory distress.     Breath sounds: Normal breath sounds.  Abdominal:     Palpations: Abdomen is soft.     Tenderness: There is no abdominal tenderness.     Comments: No abdominal tenderness to palpation  Musculoskeletal:        General: No swelling.     Cervical back: Neck supple.  Skin:    General: Skin is warm and dry.     Capillary Refill: Capillary refill takes less than 2 seconds.  Neurological:     Mental Status: She is alert.  Psychiatric:        Mood and Affect: Mood normal.     ED Results / Procedures / Treatments   Labs (all labs ordered are listed, but only abnormal results are displayed) Labs Reviewed   COMPREHENSIVE METABOLIC PANEL - Abnormal; Notable for the following components:      Result Value   Total Protein 8.5 (*)    AST 14 (*)    All other components within normal limits  URINALYSIS, ROUTINE W REFLEX MICROSCOPIC - Abnormal; Notable for the following components:   Color, Urine COLORLESS (*)    Ketones, ur 15 (*)    All other components within normal limits  GASTROINTESTINAL PANEL BY PCR, STOOL (REPLACES STOOL CULTURE)  C DIFFICILE QUICK SCREEN W PCR REFLEX    LIPASE, BLOOD  CBC  PREGNANCY, URINE    EKG None  Radiology No results found.  Procedures Procedures    Medications Ordered in ED Medications  ondansetron (ZOFRAN) injection 4 mg (4 mg Intravenous Given 05/20/23 1433)  0.9 %  sodium chloride infusion (0 mLs Intravenous Stopped 05/20/23 1543)  dicyclomine (BENTYL) capsule 10 mg (10 mg Oral Given 05/20/23 1614)    ED Course/ Medical Decision Making/ A&P                                 Medical Decision Making Amount and/or Complexity of Data Reviewed Labs: ordered.  Risk Prescription drug management.   47 y.o. female with pertinent past medical history of GERD, cholecystectomy, presents to the ED for concern of 2 weeks of diarrhea  Differential diagnosis includes but is not limited to diarrhea related to antibiotic use, infectious diarrhea, C. Difficile, gastroenteritis  ED Course:  Patient presents concern for diarrhea for past 2 weeks which started after taking antibiotics for her UTI.  Has been having about 3-4 episodes of non-bloody diarrhea per day and some nausea but no vomiting.  No fevers or chills.  No recent travel.  Vital signs within normal limits.  She was recently seen by her GI doctor on 05/17/2023 who recommended she resume metoclopramide for nausea and use Bentyl as needed for abdominal pain and diarrhea.  She has not been taking these medications because it "goes right through her".  Upon exam today she is overall well-appearing with no  abdominal tenderness to palpation.  She is not actively vomiting.  She has no leukocytosis, no elevation in LFTs, no elevation in lipase, urinalysis without any concern for UTI.  No electrolyte abnormalities.  Does not appear dehydrated.  A stool sample was obtained to evaluate for possible C. difficile given her recent antibiotic use and ongoing diarrhea.  She was discharged with instructions to continue on her Reglan as prescribed by her GI doctor for nausea and Bentyl for diarrhea.  Patient given 1 L normal saline bolus and Bentyl here for diarrhea.   Impression: Diarrhea  Disposition:  The patient was discharged home with instructions to follow-up with her GI provider in 1 week.  Start taking Bentyl for diarrhea and be on the look out for a call if her stool samples are positive for any infectious etiology. Return precautions given.  Lab Tests: I Ordered, and personally interpreted labs.  The pertinent results include:   CBC without leukocytosis CMP without electrolyte abnormalities or elevation of LFTs Lipase within normal limits Pregnancy negative UTI without concern for infection GI panel and C. difficile screening pending   External records from outside source obtained and reviewed including GI visit from 05/17/2023   Co morbidities that complicate the patient evaluation  GERD, cholecystectomy            Final Clinical Impression(s) / ED Diagnoses Final diagnoses:  Diarrhea, unspecified type    Rx / DC Orders ED Discharge Orders     None         Arabella Merles, PA-C 05/20/23 1637    Terald Sleeper, MD 05/21/23 1101

## 2023-05-21 ENCOUNTER — Telehealth: Payer: Self-pay | Admitting: Nurse Practitioner

## 2023-05-21 ENCOUNTER — Telehealth: Payer: Self-pay | Admitting: *Deleted

## 2023-05-21 ENCOUNTER — Emergency Department (HOSPITAL_BASED_OUTPATIENT_CLINIC_OR_DEPARTMENT_OTHER)
Admission: EM | Admit: 2023-05-21 | Discharge: 2023-05-21 | Disposition: A | Discharge status: Home / Self Care | Payer: Medicaid Other | Attending: Emergency Medicine | Admitting: Emergency Medicine

## 2023-05-21 ENCOUNTER — Emergency Department (HOSPITAL_BASED_OUTPATIENT_CLINIC_OR_DEPARTMENT_OTHER): Payer: Medicaid Other

## 2023-05-21 ENCOUNTER — Other Ambulatory Visit: Payer: Self-pay

## 2023-05-21 ENCOUNTER — Encounter (HOSPITAL_BASED_OUTPATIENT_CLINIC_OR_DEPARTMENT_OTHER): Payer: Self-pay

## 2023-05-21 DIAGNOSIS — I10 Essential (primary) hypertension: Secondary | ICD-10-CM | POA: Insufficient documentation

## 2023-05-21 DIAGNOSIS — R110 Nausea: Secondary | ICD-10-CM | POA: Diagnosis not present

## 2023-05-21 DIAGNOSIS — Z79899 Other long term (current) drug therapy: Secondary | ICD-10-CM | POA: Insufficient documentation

## 2023-05-21 DIAGNOSIS — R1010 Upper abdominal pain, unspecified: Secondary | ICD-10-CM

## 2023-05-21 DIAGNOSIS — R1013 Epigastric pain: Secondary | ICD-10-CM | POA: Diagnosis not present

## 2023-05-21 LAB — COMPREHENSIVE METABOLIC PANEL
ALT: 18 U/L (ref 0–44)
AST: 17 U/L (ref 15–41)
Albumin: 4.5 g/dL (ref 3.5–5.0)
Alkaline Phosphatase: 46 U/L (ref 38–126)
Anion gap: 13 (ref 5–15)
BUN: 5 mg/dL — ABNORMAL LOW (ref 6–20)
CO2: 21 mmol/L — ABNORMAL LOW (ref 22–32)
Calcium: 9.9 mg/dL (ref 8.9–10.3)
Chloride: 101 mmol/L (ref 98–111)
Creatinine, Ser: 0.97 mg/dL (ref 0.44–1.00)
GFR, Estimated: >60 mL/min (ref >60)
Glucose, Bld: 93 mg/dL (ref 70–99)
Potassium: 3.8 mmol/L (ref 3.5–5.1)
Sodium: 135 mmol/L (ref 135–145)
Total Bilirubin: 0.7 mg/dL (ref 0.3–1.2)
Total Protein: 8.6 g/dL — ABNORMAL HIGH (ref 6.5–8.1)

## 2023-05-21 LAB — GASTROINTESTINAL PANEL BY PCR, STOOL (REPLACES STOOL CULTURE)

## 2023-05-21 LAB — URINALYSIS, ROUTINE W REFLEX MICROSCOPIC
Bilirubin Urine: NEGATIVE
Glucose, UA: NEGATIVE mg/dL
Ketones, ur: >=80 mg/dL — AB
Leukocytes,Ua: NEGATIVE
Nitrite: NEGATIVE
Protein, ur: NEGATIVE mg/dL
Specific Gravity, Urine: 1.015 (ref 1.005–1.030)
pH: 5.5 (ref 5.0–8.0)

## 2023-05-21 LAB — CBC
HCT: 38.4 % (ref 36.0–46.0)
Hemoglobin: 12.6 g/dL (ref 12.0–15.0)
MCH: 28.7 pg (ref 26.0–34.0)
MCHC: 32.8 g/dL (ref 30.0–36.0)
MCV: 87.5 fL (ref 80.0–100.0)
Platelets: 233 10*3/uL (ref 150–400)
RBC: 4.39 MIL/uL (ref 3.87–5.11)
RDW: 13.2 % (ref 11.5–15.5)
WBC: 6.3 10*3/uL (ref 4.0–10.5)
nRBC: 0 % (ref 0.0–0.2)

## 2023-05-21 LAB — PREGNANCY, URINE: Preg Test, Ur: NEGATIVE

## 2023-05-21 LAB — URINALYSIS, MICROSCOPIC (REFLEX)

## 2023-05-21 LAB — LIPASE, BLOOD: Lipase: 27 U/L (ref 11–51)

## 2023-05-21 MED ORDER — ONDANSETRON HCL 4 MG/2ML IJ SOLN
4.0000 mg | Freq: Once | INTRAMUSCULAR | Status: AC
  Administered 2023-05-21: 4 mg via INTRAVENOUS
  Filled 2023-05-21: qty 2

## 2023-05-21 MED ORDER — SODIUM CHLORIDE 0.9 % IV BOLUS
1000.0000 mL | Freq: Once | INTRAVENOUS | Status: AC
  Administered 2023-05-21: 1000 mL via INTRAVENOUS

## 2023-05-21 MED ORDER — IOHEXOL 300 MG/ML  SOLN
100.0000 mL | Freq: Once | INTRAMUSCULAR | Status: AC | PRN
  Administered 2023-05-21: 100 mL via INTRAVENOUS

## 2023-05-21 NOTE — Telephone Encounter (Signed)
Patient called back for medication in addition what she is presently taking, Aciphex. Patient states the Famotidine makes her sick and would like something else. Unable to contact Ms. Guenther,NP. Nurse suggested to the patient she should go to the Urgent care or to the ED. Patient states she will be going to the Hospital and ask if possible they can do some type of test to see why she continues to have problems with her stomach.

## 2023-05-21 NOTE — ED Provider Notes (Signed)
[facility] Provider Note   CSN: [id number] Arrival date & time: 05/21/23  1801     History  Chief Complaint  Patient presents with   Abdominal Pain    Tamara Church is a 47 y.o. female.  Patient with history of HTN, GERD, hiatal hernia, Schatzki's ring, non-H. pylori related gastritis, cholelithiasis s/p cholecystectomy --presents the emergency department for evaluation of upper abdominal pain and pressure and nausea.  It was much worse after eating and drinking today.  States that she could only eat 2 egg whites from a hard-boiled egg and then became very nauseous.  No vomiting.  She has lost weight over the past several weeks.  Patient has seen her GI, most recently 4 days ago.  Currently controlled on Aciphex, famotidine, [name], Bentyl, Reglan, although not helping as much.  They were uncertain if this was GERD related or not when she last saw her GI.  Esophageal manometry testing pending but not till December.  She saw cardiology who did not feel that this was likely cardiac related.  She was seen in the emergency department yesterday, given fluids.  She had C. difficile and GI pathogen panel which were negative.  Labs are reassuring.  Presents today as the nausea was worse than she had been having.       Home Medications Prior to Admission medications   Medication Sig Start Date End Date Taking? Authorizing Provider  amLODipine (NORVASC) 10 MG tablet Take 1 tablet (10 mg total) by mouth daily. 07/31/22   Valetta Close, MD  chlorhexidine (PERIDEX) 0.12 % solution Use as directed 15 mLs in the mouth or throat 2 (two) times daily. 02/23/23   Al Decant, PA-C  dicyclomine (BENTYL) 20 MG tablet Take 1 tablet daily as needed for chest or abdominal pain 05/17/23   Meredith Pel, NP  dicyclomine (BENTYL) 20 MG tablet Take 1 tablet (20 mg total) by mouth 2 (two) times daily for 7 days. 05/20/23 05/27/23  Arabella Merles,  PA-C  famotidine (PEPCID) 20 MG tablet Take 1 tablet (20 mg total) by mouth 2 (two) times daily. 03/28/23   Curatolo, Adam, DO  [name] (alum & mag hydroxide, lidocaine, dicyclomine) oral mixture Take 30 mls by mouth twice daily  Swish and swallow 90 ml  Viscous Lisocaine, 90 ml Dicyclomine 10mg /5 ml, 270 ml maalox 05/10/23   Meredith Pel, NP  levonorgestrel (MIRENA) 20 MCG/24HR IUD 1 each by Intrauterine route once.    [provider]  metoCLOPramide (REGLAN) 5 MG tablet Take 1 tablet (5 mg total) by mouth 3 (three) times daily before meals. 05/17/23   Meredith Pel, NP  phenazopyridine (PYRIDIUM) 200 MG tablet Take 1 tablet (200 mg total) by mouth 3 (three) times daily as needed for pain (dysuria). 04/19/23   LampteyBritta Mccreedy, MD  prochlorperazine (COMPAZINE) 10 MG tablet Take 1 tablet (10 mg total) by mouth daily as needed for nausea or vomiting. 05/12/23   Meredith Pel, NP  RABEprazole (ACIPHEX) 20 MG tablet Take 20 mg by mouth 2 (two) times daily.    [provider]  spironolactone (ALDACTONE) 25 MG tablet Take 2 tablets (50 mg total) by mouth at bedtime. 09/17/22   Valetta Close, MD      Allergies    Ace inhibitors, Angiotensin receptor blockers, and Hctz [hydrochlorothiazide]    Review of Systems   Review of Systems  Physical Exam Updated Vital Signs BP Marland Kitchen)  129/91   Pulse (!) 106   Temp 98.3 F (36.8 C) (Oral)   Resp 18   Ht 5\' 2"  (1.575 m)   Wt 86 kg   LMP 05/08/2023 (Exact Date)   SpO2 97%   BMI 34.68 kg/m  Physical Exam Vitals and nursing note reviewed.  Constitutional:      General: She is not in acute distress.    Appearance: She is well-developed.  HENT:     Head: Normocephalic and atraumatic.     Right Ear: External ear normal.     Left Ear: External ear normal.     Nose: Nose normal.  Eyes:     Conjunctiva/sclera: Conjunctivae normal.  Cardiovascular:     Rate and Rhythm: Regular rhythm. Tachycardia present.     Heart  sounds: No murmur heard. Pulmonary:     Effort: No respiratory distress.     Breath sounds: No wheezing, rhonchi or rales.  Abdominal:     Palpations: Abdomen is soft.     Tenderness: There is generalized abdominal tenderness and tenderness in the epigastric area. There is no guarding or rebound.  Musculoskeletal:     Cervical back: Normal range of motion and neck supple.     Right lower leg: No edema.     Left lower leg: No edema.  Skin:    General: Skin is warm and dry.     Findings: No rash.  Neurological:     General: No focal deficit present.     Mental Status: She is alert. Mental status is at baseline.     Motor: No weakness.  Psychiatric:        Mood and Affect: Mood normal.     ED Results / Procedures / Treatments   Labs (all labs ordered are listed, but only abnormal results are displayed) Labs Reviewed  COMPREHENSIVE METABOLIC PANEL - Abnormal; Notable for the following components:      Result Value   CO2 21 (*)    BUN 5 (*)    Total Protein 8.6 (*)    All other components within normal limits  URINALYSIS, ROUTINE W REFLEX MICROSCOPIC - Abnormal; Notable for the following components:   Hgb urine dipstick TRACE (*)    Ketones, ur >=80 (*)    All other components within normal limits  URINALYSIS, MICROSCOPIC (REFLEX) - Abnormal; Notable for the following components:   Bacteria, UA RARE (*)    All other components within normal limits  LIPASE, BLOOD  CBC  PREGNANCY, URINE    EKG None  Radiology CT ABDOMEN PELVIS W CONTRAST  Result Date: 05/21/2023 CLINICAL DATA:  Acute abdominal pain. Weight loss for 2 weeks. Nausea. EXAM: CT ABDOMEN AND PELVIS WITH CONTRAST TECHNIQUE: Multidetector CT imaging of the abdomen and pelvis was performed using the standard protocol following bolus administration of intravenous contrast. RADIATION DOSE REDUCTION: This exam was performed according to the departmental dose-optimization program which includes automated exposure  control, adjustment of the mA and/or kV according to patient size and/or use of iterative reconstruction technique. CONTRAST:  OMNIPAQUE IOHEXOL 300 MG/ML  SOLN COMPARISON:  None Available. FINDINGS: Lower chest: Lung bases are clear. Hepatobiliary: No focal hepatic lesion. Postcholecystectomy. No biliary dilatation. Pancreas: Pancreas is normal. No ductal dilatation. No pancreatic inflammation. Spleen: Normal spleen Adrenals/urinary tract: Adrenal glands normal. Multiple low-density lesions in the RIGHT kidney. Several larger lesions have simple fluid attenuation. There is motion artifact which limits evaluation of the small lesions. no change from comparison exam on  08/07/2022. LEFT kidney normal. Ureters and bladder normal. Stomach/Bowel: Stomach, small bowel, appendix, and cecum are normal. The colon and rectosigmoid colon are normal. Vascular/Lymphatic: Abdominal aorta is normal caliber. No periportal or retroperitoneal adenopathy. No pelvic adenopathy. Reproductive: IUD in expected location.  Ovaries normal Other: No free fluid. Musculoskeletal: No aggressive osseous lesion. IMPRESSION: 1. No acute findings in the abdomen pelvis. 2. Normal appendix. 3. Postcholecystectomy. 4. Multiple low-density lesions in the RIGHT kidney are favored to represent cysts. No change in sizefrom comparison CT 08/07/2022. No follow-up recommended for benign renal lesion. Electronically Signed   By: Genevive Bi M.D.   On: 05/21/2023 21:18    Procedures Procedures    Medications Ordered in ED Medications  sodium chloride 0.9 % bolus 1,000 mL (has no administration in time range)  ondansetron (ZOFRAN) injection 4 mg (has no administration in time range)    ED Course/ Medical Decision Making/ A&P    Patient seen and examined. History obtained directly from patient.  Reviewed recent GI, cardiology, ED notes.  Reviewed workup including stool studies from yesterday.  Labs/EKG: Ordered CBC, CMP, lipase,  UA.  Imaging: Ordered CT abdomen pelvis.  Medications/Fluids: Ordered: IV fluid bolus, IV Zofran.   Most recent vital signs reviewed and are as follows: BP (!) 129/91   Pulse (!) 106   Temp 98.3 F (36.8 C) (Oral)   Resp 18   Ht 5\' 2"  (1.575 m)   Wt 86 kg   LMP 05/08/2023 (Exact Date)   SpO2 97%   BMI 34.68 kg/m   Initial impression: Epigastric pain, intractable nausea, weight loss  10:13 PM Reassessment performed. Patient appears stable.  No vomiting.  Fluids are completing.  Labs personally reviewed and interpreted including: CBC unremarkable; CMP with minimally elevated protein, normal albumin, normal liver function test and electrolytes, creatinine stable and normal; lipase normal; UA with greater than 80 ketones but no compelling signs of infection; pregnancy negative.  Imaging personally visualized and interpreted including: CT of the abdomen pelvis, agree no acute findings.  Reviewed pertinent lab work and imaging with patient at bedside. Questions answered.   Most current vital signs reviewed and are as follows: BP 124/80 (BP Location: Right Arm)   Pulse 94   Temp 98.4 F (36.9 C) (Oral)   Resp 18   Ht 5\' 2"  (1.575 m)   Wt 86 kg   LMP 05/08/2023 (Exact Date)   SpO2 98%   BMI 34.68 kg/m   Plan: Discharge to home.  Patient is on maximal therapy per GI.  She was concerned about the famotidine making her symptoms worse.  I think it would be reasonable for her to hold this and see if it improves her situation, but encouraged her to continue to take her PPI rabeprazole.  Prescriptions written for: None  Other home care instructions discussed: Bland diet, avoidance of triggers  ED return instructions discussed: The patient was urged to return to the Emergency Department immediately with worsening of current symptoms, worsening abdominal pain, persistent vomiting, blood noted in stools, fever, or any other concerns. The patient verbalized understanding.   Follow-up  instructions discussed: Patient encouraged to follow-up with their PCP in 3 days.                                   Medical Decision Making Amount and/or Complexity of Data Reviewed Labs: ordered. Radiology: ordered.  Risk Prescription drug management.  For this patient's complaint of abdominal pain, the following conditions were considered on the differential diagnosis: gastritis/PUD, enteritis/duodenitis, appendicitis, cholelithiasis/cholecystitis, cholangitis, pancreatitis, ruptured viscus, colitis, diverticulitis, small/large bowel obstruction, proctitis, cystitis, pyelonephritis, ureteral colic, aortic dissection, aortic aneurysm. In women, ectopic pregnancy, pelvic inflammatory disease, ovarian cysts, and tubo-ovarian abscess were also considered. Atypical chest etiologies were also considered including ACS, PE, and pneumonia.  Labs today are very stable from yesterday with exception of ketones in the urine.  Patient was hydrated.  No vomiting in the ED.  She is understandably frustrated that she has not been able to figure out what is making her feel so bad.  No acute problems or need for hospitalization identified today.  Vital signs are reassuring.  The patient's vital signs, pertinent lab work and imaging were reviewed and interpreted as discussed in the ED course. Hospitalization was considered for further testing, treatments, or serial exams/observation. However as patient is well-appearing, has a stable exam, and reassuring studies today, I do not feel that they warrant admission at this time. This plan was discussed with the patient who verbalizes agreement and comfort with this plan and seems reliable and able to return to the Emergency Department with worsening or changing symptoms.           Final Clinical Impression(s) / ED Diagnoses Final diagnoses:  Upper abdominal pain  Nausea    Rx / DC Orders ED Discharge Orders     None         Renne Crigler,  PA-C 05/21/23 2215    Vanetta Mulders, MD 05/21/23 2308

## 2023-05-21 NOTE — ED Notes (Signed)
Patient transported to CT 

## 2023-05-21 NOTE — Discharge Instructions (Addendum)
Please read and follow all provided instructions.  Your diagnoses today include:  1. Upper abdominal pain   2. Nausea     Tests performed today include: Blood cell counts and platelets: Blood tests were very stable compared to yesterday without any significant problems Kidney and liver function tests Pancreas function test (called lipase) Urine test to look for infection: No infection, suggested dehydration CT scan of your abdomen pelvis did not show any signs of a severe infection, bowel blockage or other problems today Vital signs. See below for your results today.   Medications prescribed:  None  Take any prescribed medications only as directed.  Home care instructions:  Follow any educational materials contained in this packet. Continue bland diet and medications prescribed by your gastroenterologist.  If you feel as though the famotidine is making your symptoms worse, you can try to discontinue this and see if that improves your nausea.  Please continue to take the other medications prescribed.  Follow-up instructions: Please follow-up with your primary care provider in the next 3 days for further evaluation of your symptoms.    Return instructions:  SEEK IMMEDIATE MEDICAL ATTENTION IF: The pain does not go away or becomes severe  A temperature above 101F develops  Repeated vomiting occurs (multiple episodes)  The pain becomes localized to portions of the abdomen. The right side could possibly be appendicitis. In an adult, the left lower portion of the abdomen could be colitis or diverticulitis.  Blood is being passed in stools or vomit (bright red or black tarry stools)  You develop chest pain, difficulty breathing, dizziness or fainting, or become confused, poorly responsive, or inconsolable (young children) If you have any other emergent concerns regarding your health  Additional Information: Abdominal (belly) pain can be caused by many things. Your caregiver performed  an examination and possibly ordered blood/urine tests and imaging (CT scan, x-rays, ultrasound). Many cases can be observed and treated at home after initial evaluation in the emergency department. Even though you are being discharged home, abdominal pain can be unpredictable. Therefore, you need a repeated exam if your pain does not resolve, returns, or worsens. Most patients with abdominal pain don't have to be admitted to the hospital or have surgery, but serious problems like appendicitis and gallbladder attacks can start out as nonspecific pain. Many abdominal conditions cannot be diagnosed in one visit, so follow-up evaluations are very important.  Your vital signs today were: BP 124/80 (BP Location: Right Arm)   Pulse 94   Temp 98.4 F (36.9 C) (Oral)   Resp 18   Ht 5\' 2"  (1.575 m)   Wt 86 kg   LMP 05/08/2023 (Exact Date)   SpO2 98%   BMI 34.68 kg/m  If your blood pressure (bp) was elevated above 135/85 this visit, please have this repeated by your doctor within one month. --------------

## 2023-05-21 NOTE — Telephone Encounter (Signed)
Called the patient to obtain information about complaints. Patient had gone to the ED on 05/20/23 with c/o's of abdominal pain, diarrhea upon eating. Patient states she has the abd pain and feeling of sickness upon taking the Famotidine medication, which was prescribed while at the hospital. Patient states the Aciphex continues to help with the reflux and GERD. Patient states, at present she is no longer having diarrhea and stopped taking the Famotidine. Patient would like another medication to take instead of the Famotidine. Suggested she increases her fluid intake and drink 8-10 (8 oz) glasses of water daily. Patient understood and wants to obtain medication prior to the weekend due to the holidays.

## 2023-05-21 NOTE — ED Triage Notes (Signed)
The patient having ABD pain and nausea that comes and goes. Its worse after eating. This episode started today. She stated she has been losing weight the last two weeks from this.

## 2023-05-21 NOTE — Telephone Encounter (Signed)
Inbound call from patient stating that she was seen in the ED and was  prescribed Pepcid. Patient stated since she has been taking medication she has had dizzy spells, headaches and diarrhea. Patient stated she doesn't think this medication is agreeing with her and is requesting a different medication. Please advise.

## 2023-05-22 ENCOUNTER — Inpatient Hospital Stay (HOSPITAL_COMMUNITY)
Admission: EM | Admit: 2023-05-22 | Discharge: 2023-05-28 | DRG: 394 | Disposition: A | Discharge status: Home / Self Care | Payer: Medicaid Other | Attending: Internal Medicine | Admitting: Internal Medicine

## 2023-05-22 ENCOUNTER — Encounter (HOSPITAL_COMMUNITY): Payer: Self-pay | Admitting: Emergency Medicine

## 2023-05-22 DIAGNOSIS — K2100 Gastro-esophageal reflux disease with esophagitis, without bleeding: Secondary | ICD-10-CM | POA: Diagnosis present

## 2023-05-22 DIAGNOSIS — I129 Hypertensive chronic kidney disease with stage 1 through stage 4 chronic kidney disease, or unspecified chronic kidney disease: Secondary | ICD-10-CM | POA: Diagnosis present

## 2023-05-22 DIAGNOSIS — R1115 Cyclical vomiting syndrome unrelated to migraine: Principal | ICD-10-CM | POA: Diagnosis present

## 2023-05-22 DIAGNOSIS — R197 Diarrhea, unspecified: Secondary | ICD-10-CM | POA: Diagnosis not present

## 2023-05-22 DIAGNOSIS — N182 Chronic kidney disease, stage 2 (mild): Secondary | ICD-10-CM | POA: Diagnosis present

## 2023-05-22 DIAGNOSIS — E669 Obesity, unspecified: Secondary | ICD-10-CM | POA: Diagnosis present

## 2023-05-22 DIAGNOSIS — E0781 Sick-euthyroid syndrome: Secondary | ICD-10-CM | POA: Diagnosis present

## 2023-05-22 DIAGNOSIS — K222 Esophageal obstruction: Secondary | ICD-10-CM | POA: Diagnosis present

## 2023-05-22 DIAGNOSIS — Z8249 Family history of ischemic heart disease and other diseases of the circulatory system: Secondary | ICD-10-CM

## 2023-05-22 DIAGNOSIS — K449 Diaphragmatic hernia without obstruction or gangrene: Secondary | ICD-10-CM | POA: Diagnosis present

## 2023-05-22 DIAGNOSIS — K2289 Other specified disease of esophagus: Secondary | ICD-10-CM | POA: Diagnosis present

## 2023-05-22 DIAGNOSIS — Y848 Other medical procedures as the cause of abnormal reaction of the patient, or of later complication, without mention of misadventure at the time of the procedure: Secondary | ICD-10-CM | POA: Diagnosis not present

## 2023-05-22 DIAGNOSIS — R112 Nausea with vomiting, unspecified: Secondary | ICD-10-CM | POA: Diagnosis not present

## 2023-05-22 DIAGNOSIS — E860 Dehydration: Secondary | ICD-10-CM | POA: Diagnosis present

## 2023-05-22 DIAGNOSIS — Z888 Allergy status to other drugs, medicaments and biological substances status: Secondary | ICD-10-CM

## 2023-05-22 DIAGNOSIS — E876 Hypokalemia: Secondary | ICD-10-CM | POA: Diagnosis present

## 2023-05-22 DIAGNOSIS — I10 Essential (primary) hypertension: Secondary | ICD-10-CM | POA: Diagnosis present

## 2023-05-22 DIAGNOSIS — D649 Anemia, unspecified: Secondary | ICD-10-CM | POA: Diagnosis present

## 2023-05-22 DIAGNOSIS — K2990 Gastroduodenitis, unspecified, without bleeding: Secondary | ICD-10-CM | POA: Diagnosis present

## 2023-05-22 DIAGNOSIS — R0789 Other chest pain: Secondary | ICD-10-CM | POA: Diagnosis not present

## 2023-05-22 DIAGNOSIS — K2970 Gastritis, unspecified, without bleeding: Secondary | ICD-10-CM

## 2023-05-22 DIAGNOSIS — Z6834 Body mass index (BMI) 34.0-34.9, adult: Secondary | ICD-10-CM

## 2023-05-22 DIAGNOSIS — K3189 Other diseases of stomach and duodenum: Secondary | ICD-10-CM | POA: Diagnosis present

## 2023-05-22 DIAGNOSIS — Z79899 Other long term (current) drug therapy: Secondary | ICD-10-CM

## 2023-05-22 DIAGNOSIS — T82848A Pain from vascular prosthetic devices, implants and grafts, initial encounter: Secondary | ICD-10-CM | POA: Diagnosis not present

## 2023-05-22 LAB — COMPREHENSIVE METABOLIC PANEL
ALT: 16 U/L (ref 0–44)
AST: 14 U/L — ABNORMAL LOW (ref 15–41)
Albumin: 4.3 g/dL (ref 3.5–5.0)
Alkaline Phosphatase: 43 U/L (ref 38–126)
Anion gap: 12 (ref 5–15)
BUN: <5 mg/dL — ABNORMAL LOW (ref 6–20)
CO2: 18 mmol/L — ABNORMAL LOW (ref 22–32)
Calcium: 9.5 mg/dL (ref 8.9–10.3)
Chloride: 105 mmol/L (ref 98–111)
Creatinine, Ser: 0.84 mg/dL (ref 0.44–1.00)
GFR, Estimated: >60 mL/min (ref >60)
Glucose, Bld: 89 mg/dL (ref 70–99)
Potassium: 3.6 mmol/L (ref 3.5–5.1)
Sodium: 135 mmol/L (ref 135–145)
Total Bilirubin: 0.8 mg/dL (ref 0.3–1.2)
Total Protein: 8.4 g/dL — ABNORMAL HIGH (ref 6.5–8.1)

## 2023-05-22 LAB — URINALYSIS, ROUTINE W REFLEX MICROSCOPIC
Bilirubin Urine: NEGATIVE
Glucose, UA: NEGATIVE mg/dL
Hgb urine dipstick: NEGATIVE
Ketones, ur: 80 mg/dL — AB
Leukocytes,Ua: NEGATIVE
Nitrite: NEGATIVE
Protein, ur: NEGATIVE mg/dL
Specific Gravity, Urine: 1.015 (ref 1.005–1.030)
pH: 5 (ref 5.0–8.0)

## 2023-05-22 LAB — CBC
HCT: 37.9 % (ref 36.0–46.0)
Hemoglobin: 12.1 g/dL (ref 12.0–15.0)
MCH: 28.6 pg (ref 26.0–34.0)
MCHC: 31.9 g/dL (ref 30.0–36.0)
MCV: 89.6 fL (ref 80.0–100.0)
Platelets: 230 10*3/uL (ref 150–400)
RBC: 4.23 MIL/uL (ref 3.87–5.11)
RDW: 13.5 % (ref 11.5–15.5)
WBC: 6.8 10*3/uL (ref 4.0–10.5)
nRBC: 0 % (ref 0.0–0.2)

## 2023-05-22 LAB — HCG, SERUM, QUALITATIVE: Preg, Serum: NEGATIVE

## 2023-05-22 LAB — RAPID URINE DRUG SCREEN, HOSP PERFORMED
Amphetamines: NOT DETECTED
Barbiturates: NOT DETECTED
Benzodiazepines: NOT DETECTED
Cocaine: NOT DETECTED
Opiates: NOT DETECTED
Tetrahydrocannabinol: NOT DETECTED

## 2023-05-22 LAB — LIPASE, BLOOD: Lipase: 29 U/L (ref 11–51)

## 2023-05-22 MED ORDER — FAMOTIDINE IN NACL 20-0.9 MG/50ML-% IV SOLN
20.0000 mg | Freq: Once | INTRAVENOUS | Status: AC
  Administered 2023-05-22: 20 mg via INTRAVENOUS
  Filled 2023-05-22: qty 50

## 2023-05-22 MED ORDER — ONDANSETRON HCL 4 MG/2ML IJ SOLN
4.0000 mg | Freq: Once | INTRAMUSCULAR | Status: AC
  Administered 2023-05-22: 4 mg via INTRAVENOUS
  Filled 2023-05-22: qty 2

## 2023-05-22 MED ORDER — LACTATED RINGERS IV BOLUS
1000.0000 mL | Freq: Once | INTRAVENOUS | Status: AC
  Administered 2023-05-22: 1000 mL via INTRAVENOUS

## 2023-05-22 NOTE — ED Triage Notes (Signed)
Pt reports emesis and abdominal pain x 2 weeks. Pt seen last night for same.

## 2023-05-22 NOTE — ED Provider Notes (Signed)
[facility]-EMERGENCY DEPT [facility] Emergency Department Provider Note MRN:  [id number]  Arrival date & time: 05/23/23     Chief Complaint   Emesis   History of Present Illness   Tamara Church is a 47 y.o. year-old female presents to the ED with chief complaint of persistent nausea and vomiting.  She states that she has been having this issue for the past 2 weeks.  This is her third or fourth visit for the same.  She was seen again last night for the same.  She had reassuring CT abdomen/pelvis.  She states that this feels similar to when she had her gallbladder taken out.  She does report history of acid reflux, but states that it does not seem like that.  She denies any alcohol or drug use.  She denies fevers or chills.  She states that her abdomen feels crampy.  States that she is not able to tolerate any oral intake.  History provided by patient.   Review of Systems  Pertinent positive and negative review of systems noted in HPI.    Physical Exam   Vitals:   05/22/23 2300 05/22/23 2303  BP: (!) 147/92   Pulse: 84   Resp:  (!) 24  Temp:  98.1 F (36.7 C)  SpO2: 99%     CONSTITUTIONAL:  non toxic-appearing, NAD NEURO:  Alert and oriented x 3, CN 3-12 grossly intact EYES:  eyes equal and reactive ENT/NECK:  Supple, no stridor  CARDIO:  normal rate, regular rhythm, appears well-perfused  PULM:  No respiratory distress, CTAB GI/GU:  non-distended, no focal tenderness MSK/SPINE:  No gross deformities, no edema, moves all extremities  SKIN:  no rash, atraumatic   *Additional and/or pertinent findings included in MDM below  Diagnostic and Interventional Summary    EKG Interpretation Date/Time:    Ventricular Rate:    PR Interval:    QRS Duration:    QT Interval:    QTC Calculation:   R Axis:      Text Interpretation:         Labs Reviewed  COMPREHENSIVE METABOLIC PANEL - Abnormal; Notable for the following components:      Result Value   CO2 18 (*)    BUN  <5 (*)    Total Protein 8.4 (*)    AST 14 (*)    All other components within normal limits  URINALYSIS, ROUTINE W REFLEX MICROSCOPIC - Abnormal; Notable for the following components:   APPearance HAZY (*)    Ketones, ur 80 (*)    All other components within normal limits  LIPASE, BLOOD  CBC  HCG, SERUM, QUALITATIVE  RAPID URINE DRUG SCREEN, HOSP PERFORMED  CK  MAGNESIUM  PHOSPHORUS  PREALBUMIN  SODIUM, URINE, RANDOM  CREATININE, URINE, RANDOM  OSMOLALITY  OSMOLALITY, URINE  TSH  PROCALCITONIN  ETHANOL  VITAMIN B1  BASIC METABOLIC PANEL  BETA-HYDROXYBUTYRIC ACID  I-STAT CG4 LACTIC ACID, ED    No orders to display    Medications  ondansetron (ZOFRAN) injection 4 mg (4 mg Intravenous Given 05/22/23 2300)  lactated ringers bolus 1,000 mL (1,000 mLs Intravenous New Bag/Given 05/22/23 2300)  famotidine (PEPCID) IVPB 20 mg premix (20 mg Intravenous New Bag/Given 05/22/23 2301)     Procedures  /  Critical Care Procedures  ED Course and Medical Decision Making  I have reviewed the triage vital signs, the nursing notes, and pertinent available records from the EMR.  Social Determinants Affecting Complexity of Care: Patient has no clinically  significant social determinants affecting this chief complaint..   ED Course:    Medical Decision Making Patient here with intractable nausea and vomiting.  She states that she has been having persistent vomiting for the past 2 weeks.  She has not been able to keep anything down.  She has been seen several times for the same.  No identifiable cause has been found to this point.  She does not have focal tenderness on exam, vitals are reassuring.  I do not think that she needs repeat advanced imaging.  She may need admission to the hospital for intractable nausea and vomiting.  I will treat with IV fluids, Zofran, and Pepcid, and reassess.  Amount and/or Complexity of Data Reviewed Labs: ordered.    Details: Pregnancy test negative, no  significant anemia or leukocytosis, no marked electrolyte derangement, normal lipase Radiology:     Details: I reviewed the CT scan from yesterday, which was largely unremarkable.  Given lack of focal tenderness, I do not feel that repeat imaging is indicated tonight.  Risk Prescription drug management. Decision regarding hospitalization.         Consultants: I consulted with Hospitalist, Dr. Kara Pacer, who is appreciated for admitting.   Treatment and Plan: Patient's exam and diagnostic results are concerning for intractable nausea and vomiting.  Feel that patient will need admission to the hospital for further treatment and evaluation.    Final Clinical Impressions(s) / ED Diagnoses     ICD-10-CM   1. Intractable nausea and vomiting  R11.2       ED Discharge Orders     None         Discharge Instructions Discussed with and Provided to Patient:   Discharge Instructions   None      Roxy Horseman, PA-C 05/23/23 0044    Palumbo, April, MD 05/23/23 0104

## 2023-05-23 ENCOUNTER — Encounter (HOSPITAL_COMMUNITY): Payer: Self-pay | Admitting: Internal Medicine

## 2023-05-23 DIAGNOSIS — R1115 Cyclical vomiting syndrome unrelated to migraine: Secondary | ICD-10-CM | POA: Diagnosis not present

## 2023-05-23 DIAGNOSIS — K2100 Gastro-esophageal reflux disease with esophagitis, without bleeding: Secondary | ICD-10-CM | POA: Diagnosis not present

## 2023-05-23 DIAGNOSIS — R0789 Other chest pain: Secondary | ICD-10-CM | POA: Diagnosis not present

## 2023-05-23 DIAGNOSIS — K2990 Gastroduodenitis, unspecified, without bleeding: Secondary | ICD-10-CM | POA: Diagnosis not present

## 2023-05-23 DIAGNOSIS — E876 Hypokalemia: Secondary | ICD-10-CM | POA: Diagnosis not present

## 2023-05-23 DIAGNOSIS — Z6834 Body mass index (BMI) 34.0-34.9, adult: Secondary | ICD-10-CM | POA: Diagnosis not present

## 2023-05-23 DIAGNOSIS — K3189 Other diseases of stomach and duodenum: Secondary | ICD-10-CM | POA: Diagnosis not present

## 2023-05-23 DIAGNOSIS — I10 Essential (primary) hypertension: Secondary | ICD-10-CM | POA: Diagnosis not present

## 2023-05-23 DIAGNOSIS — N182 Chronic kidney disease, stage 2 (mild): Secondary | ICD-10-CM | POA: Diagnosis not present

## 2023-05-23 DIAGNOSIS — E860 Dehydration: Secondary | ICD-10-CM | POA: Diagnosis not present

## 2023-05-23 DIAGNOSIS — R197 Diarrhea, unspecified: Secondary | ICD-10-CM | POA: Diagnosis not present

## 2023-05-23 DIAGNOSIS — K222 Esophageal obstruction: Secondary | ICD-10-CM | POA: Diagnosis not present

## 2023-05-23 DIAGNOSIS — K2289 Other specified disease of esophagus: Secondary | ICD-10-CM | POA: Diagnosis not present

## 2023-05-23 DIAGNOSIS — K2950 Unspecified chronic gastritis without bleeding: Secondary | ICD-10-CM | POA: Diagnosis not present

## 2023-05-23 DIAGNOSIS — R112 Nausea with vomiting, unspecified: Secondary | ICD-10-CM | POA: Diagnosis not present

## 2023-05-23 DIAGNOSIS — Z888 Allergy status to other drugs, medicaments and biological substances status: Secondary | ICD-10-CM | POA: Diagnosis not present

## 2023-05-23 DIAGNOSIS — Z79899 Other long term (current) drug therapy: Secondary | ICD-10-CM | POA: Diagnosis not present

## 2023-05-23 DIAGNOSIS — K449 Diaphragmatic hernia without obstruction or gangrene: Secondary | ICD-10-CM | POA: Diagnosis not present

## 2023-05-23 DIAGNOSIS — Z8249 Family history of ischemic heart disease and other diseases of the circulatory system: Secondary | ICD-10-CM | POA: Diagnosis not present

## 2023-05-23 DIAGNOSIS — E0781 Sick-euthyroid syndrome: Secondary | ICD-10-CM | POA: Diagnosis not present

## 2023-05-23 DIAGNOSIS — E669 Obesity, unspecified: Secondary | ICD-10-CM | POA: Diagnosis not present

## 2023-05-23 DIAGNOSIS — K219 Gastro-esophageal reflux disease without esophagitis: Secondary | ICD-10-CM | POA: Diagnosis not present

## 2023-05-23 DIAGNOSIS — I129 Hypertensive chronic kidney disease with stage 1 through stage 4 chronic kidney disease, or unspecified chronic kidney disease: Secondary | ICD-10-CM | POA: Diagnosis not present

## 2023-05-23 DIAGNOSIS — R109 Unspecified abdominal pain: Secondary | ICD-10-CM | POA: Diagnosis not present

## 2023-05-23 DIAGNOSIS — D649 Anemia, unspecified: Secondary | ICD-10-CM | POA: Diagnosis not present

## 2023-05-23 DIAGNOSIS — T82848A Pain from vascular prosthetic devices, implants and grafts, initial encounter: Secondary | ICD-10-CM | POA: Diagnosis not present

## 2023-05-23 DIAGNOSIS — Y848 Other medical procedures as the cause of abnormal reaction of the patient, or of later complication, without mention of misadventure at the time of the procedure: Secondary | ICD-10-CM | POA: Diagnosis not present

## 2023-05-23 DIAGNOSIS — K2970 Gastritis, unspecified, without bleeding: Secondary | ICD-10-CM | POA: Diagnosis not present

## 2023-05-23 LAB — HEMOGLOBIN A1C
Hgb A1c MFr Bld: 5.7 % — ABNORMAL HIGH (ref 4.8–5.6)
Mean Plasma Glucose: 116.89 mg/dL

## 2023-05-23 LAB — CBC
HCT: 35.1 % — ABNORMAL LOW (ref 36.0–46.0)
Hemoglobin: 11.4 g/dL — ABNORMAL LOW (ref 12.0–15.0)
MCH: 29.5 pg (ref 26.0–34.0)
MCHC: 32.5 g/dL (ref 30.0–36.0)
MCV: 90.7 fL (ref 80.0–100.0)
Platelets: 218 10*3/uL (ref 150–400)
RBC: 3.87 MIL/uL (ref 3.87–5.11)
RDW: 13.5 % (ref 11.5–15.5)
WBC: 6.2 10*3/uL (ref 4.0–10.5)
nRBC: 0 % (ref 0.0–0.2)

## 2023-05-23 LAB — SODIUM, URINE, RANDOM: Sodium, Ur: 114 mmol/L

## 2023-05-23 LAB — PHOSPHORUS: Phosphorus: 2.9 mg/dL (ref 2.5–4.6)

## 2023-05-23 LAB — COMPREHENSIVE METABOLIC PANEL
ALT: 14 U/L (ref 0–44)
AST: 13 U/L — ABNORMAL LOW (ref 15–41)
Albumin: 3.9 g/dL (ref 3.5–5.0)
Alkaline Phosphatase: 42 U/L (ref 38–126)
Anion gap: 12 (ref 5–15)
BUN: 5 mg/dL — ABNORMAL LOW (ref 6–20)
CO2: 19 mmol/L — ABNORMAL LOW (ref 22–32)
Calcium: 9.2 mg/dL (ref 8.9–10.3)
Chloride: 104 mmol/L (ref 98–111)
Creatinine, Ser: 0.8 mg/dL (ref 0.44–1.00)
GFR, Estimated: >60 mL/min (ref >60)
Glucose, Bld: 89 mg/dL (ref 70–99)
Potassium: 3.5 mmol/L (ref 3.5–5.1)
Sodium: 135 mmol/L (ref 135–145)
Total Bilirubin: 0.8 mg/dL (ref 0.3–1.2)
Total Protein: 7.6 g/dL (ref 6.5–8.1)

## 2023-05-23 LAB — ETHANOL: Alcohol, Ethyl (B): <10 mg/dL (ref <10)

## 2023-05-23 LAB — CREATININE, URINE, RANDOM: Creatinine, Urine: 207 mg/dL

## 2023-05-23 LAB — T4, FREE: Free T4: 0.72 ng/dL (ref 0.61–1.12)

## 2023-05-23 LAB — TSH: TSH: 7.278 u[IU]/mL — ABNORMAL HIGH (ref 0.350–4.500)

## 2023-05-23 LAB — MAGNESIUM: Magnesium: 2.2 mg/dL (ref 1.7–2.4)

## 2023-05-23 LAB — CK: Total CK: 73 U/L (ref 38–234)

## 2023-05-23 LAB — PROCALCITONIN: Procalcitonin: <0.1 ng/mL

## 2023-05-23 LAB — HIV ANTIBODY (ROUTINE TESTING W REFLEX): HIV Screen 4th Generation wRfx: NONREACTIVE

## 2023-05-23 LAB — OSMOLALITY, URINE: Osmolality, Ur: 492 mosm/kg (ref 300–900)

## 2023-05-23 LAB — I-STAT CG4 LACTIC ACID, ED: Lactic Acid, Venous: 0.7 mmol/L (ref 0.5–1.9)

## 2023-05-23 LAB — BETA-HYDROXYBUTYRIC ACID: Beta-Hydroxybutyric Acid: 2.18 mmol/L — ABNORMAL HIGH (ref 0.05–0.27)

## 2023-05-23 LAB — PREALBUMIN: Prealbumin: 20 mg/dL (ref 18–38)

## 2023-05-23 LAB — OSMOLALITY: Osmolality: 290 mosm/kg (ref 275–295)

## 2023-05-23 MED ORDER — BOOST / RESOURCE BREEZE PO LIQD CUSTOM
1.0000 | Freq: Three times a day (TID) | ORAL | Status: DC
  Administered 2023-05-23 – 2023-05-28 (×9): 1 via ORAL

## 2023-05-23 MED ORDER — ONDANSETRON HCL 4 MG/2ML IJ SOLN
4.0000 mg | Freq: Four times a day (QID) | INTRAMUSCULAR | Status: DC | PRN
  Administered 2023-05-23 – 2023-05-24 (×3): 4 mg via INTRAVENOUS
  Filled 2023-05-23 (×3): qty 2

## 2023-05-23 MED ORDER — SODIUM CHLORIDE 0.9 % IV SOLN: INTRAVENOUS | Status: DC

## 2023-05-23 MED ORDER — ACETAMINOPHEN 325 MG PO TABS
650.0000 mg | ORAL_TABLET | Freq: Four times a day (QID) | ORAL | Status: DC | PRN
  Administered 2023-05-23 – 2023-05-25 (×3): 650 mg via ORAL
  Filled 2023-05-23 (×3): qty 2

## 2023-05-23 MED ORDER — ACETAMINOPHEN 650 MG RE SUPP: 650.0000 mg | Freq: Four times a day (QID) | RECTAL | Status: DC | PRN

## 2023-05-23 MED ORDER — HYDROCODONE-ACETAMINOPHEN 5-325 MG PO TABS: 1.0000 | ORAL_TABLET | Freq: Four times a day (QID) | ORAL | Status: DC | PRN

## 2023-05-23 MED ORDER — AMLODIPINE BESYLATE 10 MG PO TABS: 10.0000 mg | ORAL_TABLET | Freq: Every day | ORAL | Status: DC

## 2023-05-23 MED ORDER — PANTOPRAZOLE SODIUM 40 MG IV SOLR
40.0000 mg | Freq: Two times a day (BID) | INTRAVENOUS | Status: DC
  Administered 2023-05-23 – 2023-05-26 (×7): 40 mg via INTRAVENOUS
  Filled 2023-05-23 (×7): qty 10

## 2023-05-23 MED ORDER — HYDROCODONE-ACETAMINOPHEN 5-325 MG PO TABS: 1.0000 | ORAL_TABLET | ORAL | Status: DC | PRN

## 2023-05-23 MED ORDER — SPIRONOLACTONE 25 MG PO TABS
50.0000 mg | ORAL_TABLET | Freq: Every day | ORAL | Status: DC
  Administered 2023-05-23 – 2023-05-27 (×5): 50 mg via ORAL
  Filled 2023-05-23 (×6): qty 2

## 2023-05-23 MED ORDER — ONDANSETRON HCL 4 MG PO TABS: 4.0000 mg | ORAL_TABLET | Freq: Four times a day (QID) | ORAL | Status: DC | PRN

## 2023-05-23 MED ORDER — LOPERAMIDE HCL 2 MG PO CAPS
2.0000 mg | ORAL_CAPSULE | Freq: Four times a day (QID) | ORAL | Status: DC | PRN
  Administered 2023-05-23 (×2): 2 mg via ORAL
  Filled 2023-05-23: qty 1
  Filled 2023-05-23: qty 2

## 2023-05-23 MED ORDER — METOCLOPRAMIDE HCL 5 MG/ML IJ SOLN
5.0000 mg | Freq: Four times a day (QID) | INTRAMUSCULAR | Status: DC | PRN
  Administered 2023-05-25: 5 mg via INTRAVENOUS
  Filled 2023-05-23: qty 2

## 2023-05-23 MED ORDER — PANTOPRAZOLE SODIUM 40 MG IV SOLR
40.0000 mg | Freq: Every day | INTRAVENOUS | Status: DC
  Administered 2023-05-23: 40 mg via INTRAVENOUS
  Filled 2023-05-23: qty 10

## 2023-05-23 MED ORDER — AMLODIPINE BESYLATE 10 MG PO TABS
10.0000 mg | ORAL_TABLET | Freq: Every day | ORAL | Status: DC
  Administered 2023-05-23 – 2023-05-27 (×5): 10 mg via ORAL
  Filled 2023-05-23 (×5): qty 1

## 2023-05-23 MED ORDER — FAMOTIDINE 20 MG PO TABS
20.0000 mg | ORAL_TABLET | Freq: Two times a day (BID) | ORAL | Status: DC
  Administered 2023-05-23 – 2023-05-28 (×11): 20 mg via ORAL
  Filled 2023-05-23 (×11): qty 1

## 2023-05-23 MED ORDER — METOCLOPRAMIDE HCL 5 MG/ML IJ SOLN
5.0000 mg | Freq: Three times a day (TID) | INTRAMUSCULAR | Status: AC
  Administered 2023-05-23 – 2023-05-25 (×6): 5 mg via INTRAVENOUS
  Filled 2023-05-23 (×6): qty 2

## 2023-05-23 NOTE — Assessment & Plan Note (Addendum)
Supportive measures recurrent admit for the same would benefit from GI follow up Reglan PRN

## 2023-05-23 NOTE — Subjective & Objective (Signed)
2 wks of nausea and vomiting recurrent visit for the same had CT yesterday was normal  Sp cholecystectomy with no Improvement No diarrhea

## 2023-05-23 NOTE — Assessment & Plan Note (Signed)
Allow permissive HTN  

## 2023-05-23 NOTE — Consult Note (Addendum)
Consultation  Referring Provider:  [facility]  Primary Care Physician:  Glendale Chard, DO Primary Gastroenterologist:  Dr. Meridee Score       Reason for Consultation: Intractable nausea and vomiting     LOS: 0 days          HPI:   Tamara Church is a 47 y.o. female with past medical history significant for hypertension, s/p cholecystectomy, hiatal hernia, non-H. pylori related gastritis presents for evaluation of acute on chronic intractable nausea and vomiting.  Patient presents to ED with 2-week history of intractable nausea and vomiting.  Has had recurrent visits for same.  Recently seen 5 days ago by Willette Cluster, NP for the same symptoms.  Initially had improvement in her nausea and vomiting following her cholecystectomy last year.  Though she continues to have early satiety and ongoing chest pain.  Has had extensive negative workup.  She is scheduled for screening colonoscopy at the end of September and scheduled for esophageal manometry in December.  At home patient is managed on metoclopramide 5 Mg 3 times daily.  Twice daily Aciphex, twice daily famotidine, dicyclomine, and Carafate.  She stopped Carafate and famotidine Thursday 8/29.  She noted her nausea and vomiting got worse after stopping these medications.  She subsequently presented to ED.  Reports 3 episodes of vomiting nonbloody bilious emesis.  Vomiting and nausea is worse with eating.  She also reports epigastric pain associated with eating.  Denies melena/hematochezia.  Denies NSAID use.  Pertinent workup -CT abdomen pelvis with contrast shows no acute findings.  Normal pancreas, normal liver, normal stomach, small bowel, and colon. - Hgb 11.4, stable - Liver, kidneys, electrolytes unrevealing - TSH 7.278   PREVIOUS GI WORKUP   Aug 2021 EGD - No gross lesions in esophagus. Biopsied for EoE/LoE. - Z-line irregular, 36 cm from the incisors. - Erythematous mucosa in the gastric body. No other gross lesions in the  stomach. Biopsied. - No gross lesions in the duodenal bulb, in the first portion of the duodenum and in the second portion of the duodenum. Biopsied.   Diagnosis 1. Surgical [P], duodenum - DUODENAL MUCOSA WITH NO SPECIFIC HISTOPATHOLOGIC CHANGES - NEGATIVE FOR INCREASED INTRAEPITHELIAL LYMPHOCYTES OR VILLOUS ARCHITECTURAL CHANGES 2. Surgical [P], random gastric sites - GASTRIC ANTRAL MUCOSA WITH MILD NONSPECIFIC REACTIVE GASTROPATHY - GASTRIC OXYNTIC MUCOSA WITH NO SPECIFIC HISTOPATHOLOGIC CHANGES - WARTHIN STARRY STAIN IS NEGATIVE FOR HELICOBACTER PYLORI 3. Surgical [P], random esophageal sites - ESOPHAGEAL SQUAMOUS MUCOSA WITH MILD VASCULAR CONGESTION, AND FOCAL SQUAMOUS BALLOONING, SUGGESTIVE OF MILD REFLUX ESOPHAGITIS - NEGATIVE FOR INCREASED INTRAEPITHELIAL EOSINOPHILS   EGD 08/29/22 for N/V and upper abdominal pain -No gross lesions in the entire esophagus.  A widely patent Schatzki's ring was found at the GE junction.  The Z-line was regular and found at 31 cm from the incisors.  A 4 cm hiatal hernia was present.  Segmental mild mucosal changes characterized by congestion, erythema and altered texture were found in the antrum and prepyloric region .  No gross lesions in the duodenal bulb, and the first portion of the duodenum and in the second portion of the duodenum   Diagnosis 1. Surgical [P], gastric - GASTRIC ANTRAL AND OXYNTIC MUCOSA WITH FEATURES OF REACTIVE GASTROPATHY - NEGATIVE FOR H. PYLORI ON H&E STAIN - NEGATIVE FOR INTESTINAL METAPLASIA OR MALIGNANCY 2. Surgical [P], duodenal - BENIGN SMALL BOWEL MUCOSA WITH NO SIGNIFICANT PATHOLOGIC CHANGES   Past Medical History:  Diagnosis Date   Abdominal pain,  epigastric 07/27/2020   Anemia    ANEMIA 09/07/2008   Qualifier: Diagnosis of  By: Earnest Bailey MD, Kim     Bacterial vaginosis 05/08/2020   Calculus of gallbladder without cholecystitis without obstruction 07/05/2021   Constipation 05/18/2020   Early satiety 05/18/2020    Gastritis and gastroduodenitis 07/27/2020   Hypertension    Palpitations 03/22/2020   Prolonged QT interval 10/04/2020   Screening-pulmonary TB 10/08/2021   Sore throat 09/18/2020   Unintentional weight loss 05/18/2020   UTI (urinary tract infection) 04/23/2020    Surgical History:  She  has a past surgical history that includes No past surgeries and Upper gastrointestinal endoscopy. Family History:  Her family history includes Hypertension in her mother. Social History:   reports that she has never smoked. She has never used smokeless tobacco. She reports that she does not drink alcohol and does not use drugs.  Prior to Admission medications   Medication Sig Start Date End Date Taking? Authorizing Provider  amLODipine (NORVASC) 10 MG tablet Take 1 tablet (10 mg total) by mouth daily. 07/31/22  Yes Valetta Close, MD  levonorgestrel [facility]) 20 MCG/24HR IUD 1 each by Intrauterine route once.   Yes [provider]  RABEprazole (ACIPHEX) 20 MG tablet Take 20 mg by mouth 2 (two) times daily.   Yes [provider]  spironolactone (ALDACTONE) 25 MG tablet Take 2 tablets (50 mg total) by mouth at bedtime. 09/17/22  Yes Valetta Close, MD  chlorhexidine (PERIDEX) 0.12 % solution Use as directed 15 mLs in the mouth or throat 2 (two) times daily. Patient not taking: Reported on 05/22/2023 02/23/23   Al Decant, PA-C  dicyclomine (BENTYL) 20 MG tablet Take 1 tablet daily as needed for chest or abdominal pain 05/17/23   Meredith Pel, NP  dicyclomine (BENTYL) 20 MG tablet Take 1 tablet (20 mg total) by mouth 2 (two) times daily for 7 days. 05/20/23 05/27/23  Arabella Merles, PA-C  famotidine (PEPCID) 20 MG tablet Take 1 tablet (20 mg total) by mouth 2 (two) times daily. Patient not taking: Reported on 05/22/2023 03/28/23   Virgina Norfolk, DO  [name] (alum & mag hydroxide, lidocaine, dicyclomine) oral mixture Take 30 mls by mouth twice daily  Swish and swallow 90  ml  Viscous Lisocaine, 90 ml Dicyclomine 10mg /5 ml, 270 ml maalox Patient not taking: Reported on 05/22/2023 05/10/23   Meredith Pel, NP  metoCLOPramide (REGLAN) 5 MG tablet Take 1 tablet (5 mg total) by mouth 3 (three) times daily before meals. Patient not taking: Reported on 05/22/2023 05/17/23   Meredith Pel, NP  phenazopyridine (PYRIDIUM) 200 MG tablet Take 1 tablet (200 mg total) by mouth 3 (three) times daily as needed for pain (dysuria). Patient not taking: Reported on 05/22/2023 04/19/23   Merrilee Jansky, MD  prochlorperazine (COMPAZINE) 10 MG tablet Take 1 tablet (10 mg total) by mouth daily as needed for nausea or vomiting. Patient not taking: Reported on 05/22/2023 05/12/23   Meredith Pel, NP    Current Facility-Administered Medications  Medication Dose Route Frequency Provider Last Rate Last Admin   0.9 %  sodium chloride infusion   Intravenous Continuous Therisa Doyne, MD 125 mL/hr at 05/23/23 0355 New Bag at 05/23/23 0355   acetaminophen (TYLENOL) tablet 650 mg  650 mg Oral Q6H PRN Therisa Doyne, MD   650 mg at 05/23/23 0358   Or   acetaminophen (TYLENOL) suppository 650 mg  650 mg Rectal Q6H PRN Doutova, Anastassia,  MD       feeding supplement (BOOST / RESOURCE BREEZE) liquid 1 Container  1 Container Oral TID BM Doutova, Anastassia, MD       HYDROcodone-acetaminophen (NORCO/VICODIN) 5-325 MG per tablet 1-2 tablet  1-2 tablet Oral Q4H PRN Doutova, Anastassia, MD       metoCLOPramide (REGLAN) injection 5 mg  5 mg Intravenous Q6H PRN Doutova, Anastassia, MD       ondansetron (ZOFRAN) tablet 4 mg  4 mg Oral Q6H PRN Doutova, Anastassia, MD       Or   ondansetron (ZOFRAN) injection 4 mg  4 mg Intravenous Q6H PRN Doutova, Anastassia, MD       pantoprazole (PROTONIX) injection 40 mg  40 mg Intravenous QHS Therisa Doyne, MD   40 mg at 05/23/23 0144    Allergies as of 05/22/2023 - Review Complete 05/22/2023  Allergen Reaction Noted   Ace inhibitors Cough  06/17/2015   Angiotensin receptor blockers  03/14/2020   Hctz [hydrochlorothiazide]  03/14/2020    Review of Systems  Constitutional:  Negative for chills, fever and weight loss.  HENT:  Negative for hearing loss and tinnitus.   Eyes:  Negative for blurred vision and double vision.  Respiratory:  Negative for cough and hemoptysis.   Cardiovascular:  Negative for chest pain and palpitations.  Gastrointestinal:  Positive for abdominal pain, nausea and vomiting. Negative for blood in stool, constipation, diarrhea, heartburn and melena.  Genitourinary:  Negative for dysuria and urgency.  Musculoskeletal:  Negative for myalgias and neck pain.  Skin:  Negative for itching and rash.  Neurological:  Negative for seizures and loss of consciousness.  Psychiatric/Behavioral:  Negative for depression and suicidal ideas.        Physical Exam:  Vital signs in last 24 hours: Temp:  [98.1 F (36.7 C)-98.5 F (36.9 C)] 98.5 F (36.9 C) (09/01 0335) Pulse Rate:  [82-94] 90 (09/01 0335) Resp:  [18-24] 18 (09/01 0335) BP: (123-147)/(88-95) 138/90 (09/01 0335) SpO2:  [98 %-100 %] 98 % (09/01 0335) Weight:  [84.8 kg-85.8 kg] 85.8 kg (09/01 0339) Last BM Date : 05/21/23 Last BM recorded by nurses in past 5 days No data recorded  Physical Exam Constitutional:      Appearance: Normal appearance. She is normal weight.  HENT:     Head: Normocephalic and atraumatic.     Nose: Nose normal. No congestion.     Mouth/Throat:     Mouth: Mucous membranes are moist.     Pharynx: Oropharynx is clear.  Eyes:     General: No scleral icterus.    Extraocular Movements: Extraocular movements intact.  Cardiovascular:     Rate and Rhythm: Normal rate and regular rhythm.  Pulmonary:     Effort: Pulmonary effort is normal. No respiratory distress.  Abdominal:     General: Bowel sounds are normal. There is no distension.     Palpations: Abdomen is soft. There is no mass.     Tenderness: There is abdominal  tenderness (Epigastric). There is no guarding or rebound.     Hernia: No hernia is present.  Musculoskeletal:     Cervical back: Normal range of motion and neck supple.  Skin:    General: Skin is warm and dry.  Neurological:     General: No focal deficit present.     Mental Status: She is oriented to person, place, and time.  Psychiatric:        Mood and Affect: Mood normal.  Behavior: Behavior normal.        Thought Content: Thought content normal.        Judgment: Judgment normal.      LAB RESULTS: Recent Labs    05/21/23 1944 05/22/23 1426 05/23/23 0511  WBC 6.3 6.8 6.2  HGB 12.6 12.1 11.4*  HCT 38.4 37.9 35.1*  PLT 233 230 218   BMET Recent Labs    05/21/23 1944 05/22/23 1426 05/23/23 0511  NA 135 135 135  K 3.8 3.6 3.5  CL 101 105 104  CO2 21* 18* 19*  GLUCOSE 93 89 89  BUN 5* <5* 5*  CREATININE 0.97 0.84 0.80  CALCIUM 9.9 9.5 9.2   LFT Recent Labs    05/23/23 0511  PROT 7.6  ALBUMIN 3.9  AST 13*  ALT 14  ALKPHOS 42  BILITOT 0.8   PT/INR No results for input(s): "LABPROT", "INR" in the last 72 hours.  STUDIES: CT ABDOMEN PELVIS W CONTRAST  Result Date: 05/21/2023 CLINICAL DATA:  Acute abdominal pain. Weight loss for 2 weeks. Nausea. EXAM: CT ABDOMEN AND PELVIS WITH CONTRAST TECHNIQUE: Multidetector CT imaging of the abdomen and pelvis was performed using the standard protocol following bolus administration of intravenous contrast. RADIATION DOSE REDUCTION: This exam was performed according to the departmental dose-optimization program which includes automated exposure control, adjustment of the mA and/or kV according to patient size and/or use of iterative reconstruction technique. CONTRAST:  OMNIPAQUE IOHEXOL 300 MG/ML  SOLN COMPARISON:  None Available. FINDINGS: Lower chest: Lung bases are clear. Hepatobiliary: No focal hepatic lesion. Postcholecystectomy. No biliary dilatation. Pancreas: Pancreas is normal. No ductal dilatation. No  pancreatic inflammation. Spleen: Normal spleen Adrenals/urinary tract: Adrenal glands normal. Multiple low-density lesions in the RIGHT kidney. Several larger lesions have simple fluid attenuation. There is motion artifact which limits evaluation of the small lesions. no change from comparison exam on 08/07/2022. LEFT kidney normal. Ureters and bladder normal. Stomach/Bowel: Stomach, small bowel, appendix, and cecum are normal. The colon and rectosigmoid colon are normal. Vascular/Lymphatic: Abdominal aorta is normal caliber. No periportal or retroperitoneal adenopathy. No pelvic adenopathy. Reproductive: IUD in expected location.  Ovaries normal Other: No free fluid. Musculoskeletal: No aggressive osseous lesion. IMPRESSION: 1. No acute findings in the abdomen pelvis. 2. Normal appendix. 3. Postcholecystectomy. 4. Multiple low-density lesions in the RIGHT kidney are favored to represent cysts. No change in sizefrom comparison CT 08/07/2022. No follow-up recommended for benign renal lesion. Electronically Signed   By: Genevive Bi M.D.   On: 05/21/2023 21:18      Impression    47 year old female with longstanding history of intractable nausea and vomiting with extensive workup including EGD showing H. pylori negative gastritis, scheduled for esophageal manometry December 2024  Intractable nausea and vomiting -CT abdomen pelvis with contrast shows no acute findings.  Normal pancreas, normal liver, normal stomach, small bowel, and colon. - Hgb 11.4, stable - Lipase 29 - Liver, kidneys, electrolytes unrevealing - TSH 7.278 Symptoms worsened after stopping Carafate and famotidine.  Notable hypothyroidism with elevated TSH.  Suspect symptoms are multifactorial but could be thyroid related as well.  Patient is not on scheduled Reglan at this time.  DDx includes gastroparesis, hypothyroidism resulting in decreased motility, gastritis. Patient is scheduled for multiple upcoming procedures related to the  above symptoms as well.    Plan   -Scheduled (not as needed) Reglan 5 Mg IV 3 times daily.  If no improvement on scheduled Reglan may be consider increasing to Reglan 10 Mg  3 times daily (?) - Famotidine 20 Mg twice daily - Continue Protonix IV 40 Mg - Minimize narcotics, these present a barrier to motility - Consider adding Carafate back in, although can cause constipation. - Continue supportive care - Free T4, further thyroid workup per primary team  Thank you for your kind consultation, we will continue to follow.   Bayley Leanna Sato  05/23/2023, 9:16 AM  GI ATTENDING  History, laboratories, x-rays, endoscopy reports personally reviewed.  Patient personally seen and examined.  Agree with comprehensive consultation note as outlined above.  Patient has chronic problems with varying degrees of nausea and vomiting as well as abdominal pain.  She reports some intermittent loose stools over the past week.  Worsening loose stools this morning (which I suspect is secondary to CT contrast).  Exam benign.  Agree with all of the measures amended above.  If she is able to tolerate diet and go home, convert IV medications to p.o. medications at equivalent doses.  She does have colonoscopy scheduled this month with Dr. Meridee Score.  Also, impedance/manometry scheduled for December.  She was just seen in the office last week.  No acute GI problems identified at this time.  She does have antidiarrheals if needed.  Nothing further to add, aside from the above.  No plans for additional workup.  Care is supportive at this point.  We are available if needed for new problems or questions.  Thanks.  Wilhemina Bonito. Eda Keys., M.D. [facility] Division of Gastroenterology

## 2023-05-23 NOTE — Plan of Care (Signed)

## 2023-05-23 NOTE — Assessment & Plan Note (Signed)
-  chronic avoid nephrotoxic medications such as NSAIDs, Vanco Zosyn combo,  avoid hypotension, continue to follow renal function  

## 2023-05-23 NOTE — Progress Notes (Signed)
PROGRESS NOTE  Tamara Church YQM:[id number] DOB: 21-Aug-1976   PCP: Glendale Chard, DO  Patient is from: Home.  DOA: 05/22/2023 LOS: 0  Chief complaints Chief Complaint  Patient presents with   Emesis     Brief Narrative / Interim history: 47 year old F with PMH of chronic nausea and vomiting, HTN, anemia, GERD, esophagitis and cholecystectomy presenting with intractable nausea and vomiting for 2 to 3 weeks, and admitted for the same.  Vitals and workup including CMP, CBC, lipase, lactic acid, UDS and Pro-Cal unrevealing.  Pregnancy test negative.  Recent CT on 8/30 without acute finding to explain patient's symptoms.  UA with ketonuria persistently.  BHA was 2.18.  Patient is not diabetic.  TSH slightly elevated to 7.3.  She reports compliance with Reglan and Aciphex at home.  Started on IV fluid, scheduled and as needed Reglan and clear liquid diet and admitted for further evaluation and care.  [facility] consulted.    Subjective: Seen and examined earlier this morning.  No major events overnight of this morning.  Denies nausea or vomiting but felt discomfort across upper abdomen after drinking water this morning.  Denies chest pain or dyspnea.  Complaining of pain at IV site.   Objective: Vitals:   05/22/23 2303 05/23/23 0243 05/23/23 0335 05/23/23 0339  BP:   (!) 138/90   Pulse:   90   Resp: (!) 24  18   Temp: 98.1 F (36.7 C) 98.2 F (36.8 C) 98.5 F (36.9 C)   TempSrc: Oral  Oral   SpO2:   98%   Weight: 84.8 kg   85.8 kg  Height: 5\' 2"  (1.575 m)   5\' 2"  (1.575 m)    Examination:  GENERAL: No apparent distress.  Nontoxic. HEENT: MMM.  Vision and hearing grossly intact.  NECK: Supple.  No apparent JVD.  RESP:  No IWOB.  Fair aeration bilaterally. CVS:  RRR. Heart sounds normal.  ABD/GI/GU: BS+. Abd soft.  Mild diffuse tenderness.  No rebound or guarding. MSK/EXT:  Moves extremities. No apparent deformity. No edema.  SKIN: no apparent skin lesion or wound NEURO:  Awake, alert and oriented appropriately.  No apparent focal neuro deficit. PSYCH: Calm. Normal affect.   Procedures:  None  Microbiology summarized: None  Assessment and plan: Principal Problem:   Intractable nausea and vomiting Active Problems:   HYPERTENSION, BENIGN SYSTEMIC   CKD (chronic kidney disease) stage 2, GFR 60-89 ml/min   Intractable acute on chronic nausea and vomiting: Workup including basic labs and CT abdomen and pelvis unrevealing except for ketonuria, elevated BHA and mildly elevated TSH.  No history of diabetes.  Glucose within normal.  She had cholecystectomy about a year ago.  Followed by [facility].  Had extensive workup. -Continue supportive care-with scheduled and as needed Reglan, IV Protonix and IVF -Agree with minimizing narcotics.  Reduce Norco to every 6 hours as needed -Advance to full liquid diet  Essential hypertension: BP acceptable for most part. -Resume home amlodipine  Normocytic anemia: Stable -Monitor  Elevated TSH -Check free T4  GERD/esophagitis -PPI as above  Ketonuria: Likely due to poor p.o. intake and dehydration.  Not diabetic.  Obesity Body mass index is 34.6 kg/m. -Encourage lifestyle change to lose weight          DVT prophylaxis:  SCDs Start: 05/23/23 5284  Code Status: Full code Family Communication: None at bedside Level of care: Telemetry Status is: Observation The patient will require care spanning > 2 midnights and should  be moved to inpatient because: Intractable nausea and vomiting and abdominal pain   Final disposition: Home Consultants:  [facility]  55 minutes with more than 50% spent in reviewing records, counseling patient/family and coordinating care.   Sch Meds:  Scheduled Meds:  famotidine  20 mg Oral BID   feeding supplement  1 Container Oral TID BM   metoCLOPramide (REGLAN) injection  5 mg Intravenous Q8H   pantoprazole (PROTONIX) IV  40 mg Intravenous Q12H   Continuous Infusions:   sodium chloride 125 mL/hr at 05/23/23 0355   PRN Meds:.acetaminophen **OR** acetaminophen, HYDROcodone-acetaminophen, metoCLOPramide (REGLAN) injection, ondansetron **OR** ondansetron (ZOFRAN) IV  Antimicrobials: Anti-infectives (From admission, onward)    None        I have personally reviewed the following labs and images: CBC: Recent Labs  Lab 05/20/23 1409 05/21/23 1944 05/22/23 1426 05/23/23 0511  WBC 6.4 6.3 6.8 6.2  HGB 12.6 12.6 12.1 11.4*  HCT 37.3 38.4 37.9 35.1*  MCV 86.7 87.5 89.6 90.7  PLT 250 233 230 218   BMP &GFR Recent Labs  Lab 05/18/23 0822 05/20/23 1409 05/21/23 1944 05/22/23 1426 05/23/23 0511  NA  --  137 135 135 135  K  --  3.7 3.8 3.6 3.5  CL  --  100 101 105 104  CO2  --  26 21* 18* 19*  GLUCOSE  --  91 93 89 89  BUN  --  8 5* <5* 5*  CREATININE  --  0.97 0.97 0.84 0.80  CALCIUM  --  10.2 9.9 9.5 9.2  MG 2.3  --   --   --  2.2  PHOS  --   --   --   --  2.9   Estimated Creatinine Clearance: 90.3 mL/min (by C-G formula based on SCr of 0.8 mg/dL). Liver & Pancreas: Recent Labs  Lab 05/20/23 1409 05/21/23 1944 05/22/23 1426 05/23/23 0511  AST 14* 17 14* 13*  ALT 16 18 16 14   ALKPHOS 47 46 43 42  BILITOT 0.7 0.7 0.8 0.8  PROT 8.5* 8.6* 8.4* 7.6  ALBUMIN 4.6 4.5 4.3 3.9   Recent Labs  Lab 05/20/23 1409 05/21/23 1944 05/22/23 1426  LIPASE 22 27 29    No results for input(s): "AMMONIA" in the last 168 hours. Diabetic: No results for input(s): "HGBA1C" in the last 72 hours. No results for input(s): "GLUCAP" in the last 168 hours. Cardiac Enzymes: Recent Labs  Lab 05/23/23 0511  CKTOTAL 73   Recent Labs    05/18/23 0822  PROBNP <36   Coagulation Profile: No results for input(s): "INR", "PROTIME" in the last 168 hours. Thyroid Function Tests: Recent Labs    05/23/23 0511  TSH 7.278*   Lipid Profile: No results for input(s): "CHOL", "HDL", "LDLCALC", "TRIG", "CHOLHDL", "LDLDIRECT" in the last 72 hours. Anemia  Panel: No results for input(s): "VITAMINB12", "FOLATE", "FERRITIN", "TIBC", "IRON", "RETICCTPCT" in the last 72 hours. Urine analysis:    Component Value Date/Time   COLORURINE YELLOW 05/22/2023 2240   APPEARANCEUR HAZY (A) 05/22/2023 2240   LABSPEC 1.015 05/22/2023 2240   PHURINE 5.0 05/22/2023 2240   GLUCOSEU NEGATIVE 05/22/2023 2240   HGBUR NEGATIVE 05/22/2023 2240   BILIRUBINUR NEGATIVE 05/22/2023 2240   BILIRUBINUR negative 04/19/2023 1602   KETONESUR 80 (A) 05/22/2023 2240   PROTEINUR NEGATIVE 05/22/2023 2240   UROBILINOGEN 0.2 04/19/2023 1602   UROBILINOGEN 0.2 07/04/2020 0852   NITRITE NEGATIVE 05/22/2023 2240   LEUKOCYTESUR NEGATIVE 05/22/2023 2240   Sepsis Labs: Invalid  input(s): "PROCALCITONIN", "LACTICIDVEN"  Microbiology: Recent Results (from the past 240 hour(s))  Gastrointestinal Panel by PCR , Stool     Status: None   Collection Time: 05/20/23  4:04 PM   Specimen: Stool  Result Value Ref Range Status   Campylobacter species NOT DETECTED NOT DETECTED Final   Plesimonas shigelloides NOT DETECTED NOT DETECTED Final   Salmonella species NOT DETECTED NOT DETECTED Final   Yersinia enterocolitica NOT DETECTED NOT DETECTED Final   Vibrio species NOT DETECTED NOT DETECTED Final   Vibrio cholerae NOT DETECTED NOT DETECTED Final   Enteroaggregative E coli (EAEC) NOT DETECTED NOT DETECTED Final   Enteropathogenic E coli (EPEC) NOT DETECTED NOT DETECTED Final   Enterotoxigenic E coli (ETEC) NOT DETECTED NOT DETECTED Final   Shiga like toxin producing E coli (STEC) NOT DETECTED NOT DETECTED Final   Shigella/Enteroinvasive E coli (EIEC) NOT DETECTED NOT DETECTED Final   Cryptosporidium NOT DETECTED NOT DETECTED Final   Cyclospora cayetanensis NOT DETECTED NOT DETECTED Final   Entamoeba histolytica NOT DETECTED NOT DETECTED Final   Giardia lamblia NOT DETECTED NOT DETECTED Final   Adenovirus F40/41 NOT DETECTED NOT DETECTED Final   Astrovirus NOT DETECTED NOT DETECTED  Final   Norovirus GI/GII NOT DETECTED NOT DETECTED Final   Rotavirus A NOT DETECTED NOT DETECTED Final   Sapovirus (I, II, IV, and V) NOT DETECTED NOT DETECTED Final    Comment: Performed at [facility], [address] [place], [place] [address]  C Difficile Quick Screen w PCR reflex     Status: None   Collection Time: 05/20/23  4:04 PM   Specimen: Stool  Result Value Ref Range Status   C Diff antigen NEGATIVE NEGATIVE Final   C Diff toxin NEGATIVE NEGATIVE Final   C Diff interpretation No C. difficile detected.  Final    Comment: Performed at [facility], [address], [place], [place] [address]    Radiology Studies: No results found.    Alakai Macbride T. Erminio Nygard [facility]  If 7PM-7AM, please contact night-coverage [email or web address] 05/23/2023, 10:34 AM

## 2023-05-23 NOTE — H&P (Signed)
Tamara Church:[id number] DOB: 02-10-1976 DOA: 05/22/2023     PCP: Glendale Chard, DO   Outpatient Specialists:   GI* Dr. Pilar Grammes (  [facility])    Patient arrived to ER on 05/22/23 at 1344 Referred by Attending Palumbo, April, MD   Patient coming from:    home Lives  With family      Chief Complaint:   Chief Complaint  Patient presents with   Emesis    HPI: Tamara Church is a 47 y.o. female with medical history significant of  HTN, intractable nausea and vomiting, anemia,     Presented with intractable nausea and vomiting 2 wks of nausea and vomiting recurrent visit for the same had CT yesterday was normal  Sp cholecystectomy with no Improvement No diarrhea     She has been seen for this by [facility]  Has been off Carafate famotidine seems to be making her sick  Denies significant ETOH intake   Does not smoke   Lab Results  Component Value Date   SARSCOV2NAA NEGATIVE 05/08/2023   SARSCOV2NAA NEGATIVE 09/12/2022   SARSCOV2NAA NEGATIVE 08/21/2021   SARSCOV2NAA POSITIVE (A) 09/19/2020        Regarding pertinent Chronic problems:     Hyperlipidemia -  not on on statins   Lipid Panel     Component Value Date/Time   CHOL 115 09/04/2008 2002   TRIG 47 09/04/2008 2002   HDL 44 09/04/2008 2002   CHOLHDL 2.6 Ratio 09/04/2008 2002   VLDL 9 09/04/2008 2002   LDLCALC 62 09/04/2008 2002     HTN on NOrvasc, spironolcatone    While in ER:     Lab Orders         Lipase, blood         Comprehensive metabolic panel         CBC         Urinalysis, Routine w reflex microscopic -Urine, Clean Catch         hCG, serum, qualitative         Rapid urine drug screen (hospital performed)         CK         Magnesium         Phosphorus         Prealbumin         Sodium, urine, random         Creatinine, urine, random         Osmolality         Osmolality, urine         TSH         Procalcitonin         Ethanol         Vitamin B1      CTabd/pelvis done on  05/21/2023-  non-acute    Following Medications were ordered in ER: Medications  ondansetron (ZOFRAN) injection 4 mg (4 mg Intravenous Given 05/22/23 2300)  lactated ringers bolus 1,000 mL (1,000 mLs Intravenous New Bag/Given 05/22/23 2300)  famotidine (PEPCID) IVPB 20 mg premix (20 mg Intravenous New Bag/Given 05/22/23 2301)    _________  ED Triage Vitals  Encounter Vitals Group     BP 05/22/23 1400 123/88     Systolic BP Percentile --      Diastolic BP Percentile --      Pulse Rate 05/22/23 1400 93     Resp 05/22/23 1401 19     Temp 05/22/23 1401 98.4 F (  36.9 C)     Temp Source 05/22/23 1401 Oral     SpO2 05/22/23 1400 100 %     Weight 05/22/23 2303 187 lb (84.8 kg)     Height 05/22/23 2303 5\' 2"  (1.575 m)     Head Circumference --      Peak Flow --      Pain Score 05/22/23 2303 9     Pain Loc --      Pain Education --      Exclude from Growth Chart --   GMWN(02)@     _________________________________________ Significant initial  Findings: Abnormal Labs Reviewed  COMPREHENSIVE METABOLIC PANEL - Abnormal; Notable for the following components:      Result Value   CO2 18 (*)    BUN <5 (*)    Total Protein 8.4 (*)    AST 14 (*)    All other components within normal limits  URINALYSIS, ROUTINE W REFLEX MICROSCOPIC - Abnormal; Notable for the following components:   APPearance HAZY (*)    Ketones, ur 80 (*)    All other components within normal limits       ECG: Ordered Personally reviewed and interpreted by me showing: HR : 80 Rhythm:  NSR   no evidence of ischemic changes QTC 434    Lab Results  Component Value Date   SARSCOV2NAA NEGATIVE 05/08/2023   SARSCOV2NAA NEGATIVE 09/12/2022   SARSCOV2NAA NEGATIVE 08/21/2021   SARSCOV2NAA POSITIVE (A) 09/19/2020     The recent clinical data is shown below. Vitals:   05/22/23 1415 05/22/23 2250 05/22/23 2300 05/22/23 2303  BP: 127/88 (!) 145/95 (!) 147/92   Pulse: 83 82 84   Resp:  18  (!) 24  Temp:    98.1 F  (36.7 C)  TempSrc:    Oral  SpO2: 100% 99% 99%   Weight:    84.8 kg  Height:    5\' 2"  (1.575 m)    WBC     Component Value Date/Time   WBC 6.8 05/22/2023 1426   LYMPHSABS 1.0 04/14/2023 2232   MONOABS 0.4 04/14/2023 2232   EOSABS 0.0 04/14/2023 2232   BASOSABS 0.0 04/14/2023 2232       UA   no evidence of UTI      Urine analysis:    Component Value Date/Time   COLORURINE YELLOW 05/22/2023 2240   APPEARANCEUR HAZY (A) 05/22/2023 2240   LABSPEC 1.015 05/22/2023 2240   PHURINE 5.0 05/22/2023 2240   GLUCOSEU NEGATIVE 05/22/2023 2240   HGBUR NEGATIVE 05/22/2023 2240   BILIRUBINUR NEGATIVE 05/22/2023 2240   BILIRUBINUR negative 04/19/2023 1602   KETONESUR 80 (A) 05/22/2023 2240   PROTEINUR NEGATIVE 05/22/2023 2240   UROBILINOGEN 0.2 04/19/2023 1602   UROBILINOGEN 0.2 07/04/2020 0852   NITRITE NEGATIVE 05/22/2023 2240   LEUKOCYTESUR NEGATIVE 05/22/2023 2240    Results for orders placed or performed during the hospital encounter of 05/20/23  Gastrointestinal Panel by PCR , Stool     Status: None   Collection Time: 05/20/23  4:04 PM   Specimen: Stool  Result Value Ref Range Status   Campylobacter species NOT DETECTED NOT DETECTED Final   Plesimonas shigelloides NOT DETECTED NOT DETECTED Final   Salmonella species NOT DETECTED NOT DETECTED Final   Yersinia enterocolitica NOT DETECTED NOT DETECTED Final   Vibrio species NOT DETECTED NOT DETECTED Final   Vibrio cholerae NOT DETECTED NOT DETECTED Final   Enteroaggregative E coli (EAEC) NOT DETECTED NOT DETECTED Final   Enteropathogenic  E coli (EPEC) NOT DETECTED NOT DETECTED Final   Enterotoxigenic E coli (ETEC) NOT DETECTED NOT DETECTED Final   Shiga like toxin producing E coli (STEC) NOT DETECTED NOT DETECTED Final   Shigella/Enteroinvasive E coli (EIEC) NOT DETECTED NOT DETECTED Final   Cryptosporidium NOT DETECTED NOT DETECTED Final   Cyclospora cayetanensis NOT DETECTED NOT DETECTED Final   Entamoeba histolytica NOT  DETECTED NOT DETECTED Final   Giardia lamblia NOT DETECTED NOT DETECTED Final   Adenovirus F40/41 NOT DETECTED NOT DETECTED Final   Astrovirus NOT DETECTED NOT DETECTED Final   Norovirus GI/GII NOT DETECTED NOT DETECTED Final   Rotavirus A NOT DETECTED NOT DETECTED Final   Sapovirus (I, II, IV, and V) NOT DETECTED NOT DETECTED Final    Comment: Performed at [facility], [address] [place], [place] [address]  C Difficile Quick Screen w PCR reflex     Status: None   Collection Time: 05/20/23  4:04 PM   Specimen: Stool  Result Value Ref Range Status   C Diff antigen NEGATIVE NEGATIVE Final   C Diff toxin NEGATIVE NEGATIVE Final   C Diff interpretation No C. difficile detected.  Final    Comment: Performed at [facility], [address] [place], [place] [address]      Susceptibility data from last 90 days. Collected Specimen Info Organism AMPICILLIN AMPICILLIN/SULBACTAM CEFAZOLIN CEFEPIME CEFTRIAXONE Ciprofloxacin Gentamicin Susc lslt Imipenem Nitrofurantoin Susc lslt Piperacillin + Tazobactam Trimethoprim/Sulfa  04/19/23 Urine, Clean Catch Escherichia coli  R  R  S  S  S  S  R  S  S  S  R    __________________________________________________________ Recent Labs  Lab 05/18/23 0822 05/20/23 1409 05/21/23 1944 05/22/23 1426  NA  --  137 135 135  K  --  3.7 3.8 3.6  CO2  --  26 21* 18*  GLUCOSE  --  91 93 89  BUN  --  8 5* <5*  CREATININE  --  0.97 0.97 0.84  CALCIUM  --  10.2 9.9 9.5  MG 2.3  --   --   --     Cr  stable,    Lab Results  Component Value Date   CREATININE 0.84 05/22/2023   CREATININE 0.97 05/21/2023   CREATININE 0.97 05/20/2023    Recent Labs  Lab 05/20/23 1409 05/21/23 1944 05/22/23 1426  AST 14* 17 14*  ALT 16 18 16   ALKPHOS 47 46 43  BILITOT 0.7 0.7 0.8  PROT 8.5* 8.6* 8.4*  ALBUMIN 4.6 4.5 4.3   Lab Results  Component Value Date   CALCIUM 9.5 05/22/2023    Plt: Lab Results  Component Value Date   PLT 230  05/22/2023         Recent Labs  Lab 05/20/23 1409 05/21/23 1944 05/22/23 1426  WBC 6.4 6.3 6.8  HGB 12.6 12.6 12.1  HCT 37.3 38.4 37.9  MCV 86.7 87.5 89.6  PLT 250 233 230    HG/HCT  stable,      Component Value Date/Time   HGB 12.1 05/22/2023 1426   HCT 37.9 05/22/2023 1426   MCV 89.6 05/22/2023 1426     Recent Labs  Lab 05/20/23 1409 05/21/23 1944 05/22/23 1426  LIPASE 22 27 29     _______________________________________________ Hospitalist was called for admission for   Intractable nausea and vomiting   The following Work up has been ordered so far:  Orders Placed This Encounter  Procedures   Lipase, blood  Comprehensive metabolic panel   CBC   Urinalysis, Routine w reflex microscopic -Urine, Clean Catch   hCG, serum, qualitative   Rapid urine drug screen (hospital performed)   CK   Magnesium   Phosphorus   Prealbumin   Sodium, urine, random   Creatinine, urine, random   Osmolality   Osmolality, urine   TSH   Procalcitonin   Ethanol   Vitamin B1   Diet NPO time specified   Consult to hospitalist     OTHER Significant initial  Findings:  labs showing:     DM  labs:  HbA1C: No results for input(s): "HGBA1C" in the last 8760 hours.     CBG (last 3)  No results for input(s): "GLUCAP" in the last 72 hours.        Cultures:    Component Value Date/Time   SDES URINE, CLEAN CATCH 04/19/2023 1625   SPECREQUEST  04/19/2023 1625    NONE Performed at [facility], [address] [place], [place] [address]    CULT >=100,000 COLONIES/mL ESCHERICHIA COLI (A) 04/19/2023 1625   REPTSTATUS 04/21/2023 FINAL 04/19/2023 1625     Radiological Exams on Admission: CT ABDOMEN PELVIS W CONTRAST  Result Date: 05/21/2023 CLINICAL DATA:  Acute abdominal pain. Weight loss for 2 weeks. Nausea. EXAM: CT ABDOMEN AND PELVIS WITH CONTRAST TECHNIQUE: Multidetector CT imaging of the abdomen and pelvis was performed using the standard protocol following  bolus administration of intravenous contrast. RADIATION DOSE REDUCTION: This exam was performed according to the departmental dose-optimization program which includes automated exposure control, adjustment of the mA and/or kV according to patient size and/or use of iterative reconstruction technique. CONTRAST:  OMNIPAQUE IOHEXOL 300 MG/ML  SOLN COMPARISON:  None Available. FINDINGS: Lower chest: Lung bases are clear. Hepatobiliary: No focal hepatic lesion. Postcholecystectomy. No biliary dilatation. Pancreas: Pancreas is normal. No ductal dilatation. No pancreatic inflammation. Spleen: Normal spleen Adrenals/urinary tract: Adrenal glands normal. Multiple low-density lesions in the RIGHT kidney. Several larger lesions have simple fluid attenuation. There is motion artifact which limits evaluation of the small lesions. no change from comparison exam on 08/07/2022. LEFT kidney normal. Ureters and bladder normal. Stomach/Bowel: Stomach, small bowel, appendix, and cecum are normal. The colon and rectosigmoid colon are normal. Vascular/Lymphatic: Abdominal aorta is normal caliber. No periportal or retroperitoneal adenopathy. No pelvic adenopathy. Reproductive: IUD in expected location.  Ovaries normal Other: No free fluid. Musculoskeletal: No aggressive osseous lesion. IMPRESSION: 1. No acute findings in the abdomen pelvis. 2. Normal appendix. 3. Postcholecystectomy. 4. Multiple low-density lesions in the RIGHT kidney are favored to represent cysts. No change in sizefrom comparison CT 08/07/2022. No follow-up recommended for benign renal lesion. Electronically Signed   By: Genevive Bi M.D.   On: 05/21/2023 21:18   _______________________________________________________________________________________________________ Latest  Blood pressure (!) 147/92, pulse 84, temperature 98.1 F (36.7 C), temperature source Oral, resp. rate (!) 24, height 5\' 2"  (1.575 m), weight 84.8 kg, last menstrual period 05/08/2023,  SpO2 99%.   Vitals  labs and radiology finding personally reviewed  Review of Systems:    Pertinent positives include:   abdominal pain, nausea, vomiting,  Constitutional:  No weight loss, night sweats, Fevers, chills, fatigue, weight loss  HEENT:  No headaches, Difficulty swallowing,Tooth/dental problems,Sore throat,  No sneezing, itching, ear ache, nasal congestion, post nasal drip,  Cardio-vascular:  No chest pain, Orthopnea, PND, anasarca, dizziness, palpitations.no Bilateral lower extremity swelling  GI:  No heartburn, indigestion, diarrhea, change in bowel habits, loss of appetite,  melena, blood in stool, hematemesis Resp:  no shortness of breath at rest. No dyspnea on exertion, No excess mucus, no productive cough, No non-productive cough, No coughing up of blood.No change in color of mucus.No wheezing. Skin:  no rash or lesions. No jaundice GU:  no dysuria, change in color of urine, no urgency or frequency. No straining to urinate.  No flank pain.  Musculoskeletal:  No joint pain or no joint swelling. No decreased range of motion. No back pain.  Psych:  No change in mood or affect. No depression or anxiety. No memory loss.  Neuro: no localizing neurological complaints, no tingling, no weakness, no double vision, no gait abnormality, no slurred speech, no confusion  All systems reviewed and apart from HOPI all are negative _______________________________________________________________________________________________ Past Medical History:   Past Medical History:  Diagnosis Date   Abdominal pain, epigastric 07/27/2020   Anemia    ANEMIA 09/07/2008   Qualifier: Diagnosis of  By: Earnest Bailey MD, Kim     Bacterial vaginosis 05/08/2020   Calculus of gallbladder without cholecystitis without obstruction 07/05/2021   Constipation 05/18/2020   Early satiety 05/18/2020   Gastritis and gastroduodenitis 07/27/2020   Hypertension    Palpitations 03/22/2020   Prolonged QT interval  10/04/2020   Screening-pulmonary TB 10/08/2021   Sore throat 09/18/2020   Unintentional weight loss 05/18/2020   UTI (urinary tract infection) 04/23/2020      Past Surgical History:  Procedure Laterality Date   NO PAST SURGERIES     UPPER GASTROINTESTINAL ENDOSCOPY      Social History:  Ambulatory   independently      reports that she has never smoked. She has never used smokeless tobacco. She reports that she does not drink alcohol and does not use drugs.   Family History:  Family History  Problem Relation Age of Onset   Hypertension Mother    Colon cancer Neg Hx    Esophageal cancer Neg Hx    Rectal cancer Neg Hx    Stomach cancer Neg Hx    Inflammatory bowel disease Neg Hx    Liver disease Neg Hx    Pancreatic cancer Neg Hx    ______________________________________________________________________________________________ Allergies: Allergies  Allergen Reactions   Ace Inhibitors Cough   Angiotensin Receptor Blockers     cough   Hctz [Hydrochlorothiazide]     hypokalemia     Prior to Admission medications   Medication Sig Start Date End Date Taking? Authorizing Provider  amLODipine (NORVASC) 10 MG tablet Take 1 tablet (10 mg total) by mouth daily. 07/31/22  Yes Valetta Close, MD  dicyclomine (BENTYL) 20 MG tablet Take 1 tablet daily as needed for chest or abdominal pain 05/17/23  Yes Meredith Pel, NP  dicyclomine (BENTYL) 20 MG tablet Take 1 tablet (20 mg total) by mouth 2 (two) times daily for 7 days. 05/20/23 05/27/23 Yes Arabella Merles, PA-C  levonorgestrel (MIRENA) 20 MCG/24HR IUD 1 each by Intrauterine route once.   Yes [provider]  RABEprazole (ACIPHEX) 20 MG tablet Take 20 mg by mouth 2 (two) times daily.   Yes [provider]  spironolactone (ALDACTONE) 25 MG tablet Take 2 tablets (50 mg total) by mouth at bedtime. 09/17/22  Yes Valetta Close, MD  chlorhexidine (PERIDEX) 0.12 % solution Use as directed 15 mLs in the mouth  or throat 2 (two) times daily. Patient not taking: Reported on 05/22/2023 02/23/23   Al Decant, PA-C  famotidine (PEPCID) 20 MG tablet Take 1  tablet (20 mg total) by mouth 2 (two) times daily. Patient not taking: Reported on 05/22/2023 03/28/23   Virgina Norfolk, DO  [name] (alum & mag hydroxide, lidocaine, dicyclomine) oral mixture Take 30 mls by mouth twice daily  Swish and swallow 90 ml  Viscous Lisocaine, 90 ml Dicyclomine 10mg /5 ml, 270 ml maalox Patient not taking: Reported on 05/22/2023 05/10/23   Meredith Pel, NP  metoCLOPramide (REGLAN) 5 MG tablet Take 1 tablet (5 mg total) by mouth 3 (three) times daily before meals. Patient not taking: Reported on 05/22/2023 05/17/23   Meredith Pel, NP  phenazopyridine (PYRIDIUM) 200 MG tablet Take 1 tablet (200 mg total) by mouth 3 (three) times daily as needed for pain (dysuria). Patient not taking: Reported on 05/22/2023 04/19/23   Merrilee Jansky, MD  prochlorperazine (COMPAZINE) 10 MG tablet Take 1 tablet (10 mg total) by mouth daily as needed for nausea or vomiting. Patient not taking: Reported on 05/22/2023 05/12/23   Meredith Pel, NP    ___________________________________________________________________________________________________ Physical Exam:    05/22/2023   11:03 PM 05/22/2023   11:00 PM 05/22/2023   10:50 PM  Vitals with BMI  Height 5\' 2"     Weight 187 lbs    BMI 34.19    Systolic  147 145  Diastolic  92 95  Pulse  84 82     1. General:  in No  Acute distress   Chronically ill   -appearing 2. Psychological: Alert and   Oriented 3. Head/ENT:    ry Mucous Membranes                          Head Non traumatic, neck supple                          Poor Dentition 4. SKIN: decreased Skin turgor,  Skin clean Dry and intact no rash    5. Heart: Regular rate and rhythm no  Murmur, no Rub or gallop 6. Lungs:  no wheezes or crackles   7. Abdomen: Soft,epigastric -tender, Non distended   obese  bowel sounds  present 8. Lower extremities: no clubbing, cyanosis, no  edema 9. Neurologically Grossly intact, moving all 4 extremities equally   10. MSK: Normal range of motion    Chart has been reviewed  ______________________________________________________________________________________________  Assessment/Plan 47 y.o. female with medical history significant of  HTN, intractable nausea and vomiting, anemia,   Admitted for   Intractable nausea and vomiting    Present on Admission:  CKD (chronic kidney disease) stage 2, GFR 60-89 ml/min  HYPERTENSION, BENIGN SYSTEMIC  Intractable nausea and vomiting     CKD (chronic kidney disease) stage 2, GFR 60-89 ml/min  -chronic avoid nephrotoxic medications such as NSAIDs, Vanco Zosyn combo,  avoid hypotension, continue to follow renal function   HYPERTENSION, BENIGN SYSTEMIC Allow permissive HTN   Intractable nausea and vomiting Supportive measures recurrent admit for the same would benefit from GI follow up Reglan PRN   Other plan as per orders.  DVT prophylaxis:  SCD      Code Status:    Code Status: Prior FULL CODE as per patient   I had personally discussed CODE STATUS with patient  ACP  has been reviewed     Family Communication:   Family  at  Bedside  plan of care was discussed   with  Husband,   Diet  Diet Orders (  From admission, onward)     Start     Ordered   05/22/23 1410  Diet NPO time specified  Diet effective now        05/22/23 1410            Disposition Plan:     To home once workup is complete and patient is stable   Following barriers for discharge:                           nausea controlled with PO medications                                                        Will need consultants to evaluate patient prior to discharge       Consult Orders  (From admission, onward)           Start     Ordered   05/22/23 2358  Consult to hospitalist  Once       Provider:  (Not yet assigned)  Question  Answer Comment  Place call to: [facility]   Reason for Consult Admit      05/22/23 2357                               Consults called:  sent msg to [facility]   Admission status:  ED Disposition     ED Disposition  Admit   Condition  --   Comment  Hospital [facility] [[id number]]  Level of Care: Telemetry [5]  Admit to tele based on following criteria: Other see comments  Comments: moniotor electrolytes  May place patient in observation at [facility] or [facility] if equivalent level of care is available:: No  Covid Evaluation: Asymptomatic - no recent exposure (last 10 days) testing not required  Diagnosis: Intractable nausea and vomiting [720114]  Admitting Physician: Therisa Doyne [[id number]]  Attending Physician: Therisa Doyne [[id number]]           Obs      Level of care     tele  For 12H        Maisen Schmit 05/23/2023, 1:01 AM    [facility]     after 2 AM please page floor coverage PA If 7AM-7PM, please contact the day team taking care of the patient using [email or web address]

## 2023-05-24 DIAGNOSIS — R112 Nausea with vomiting, unspecified: Secondary | ICD-10-CM | POA: Diagnosis not present

## 2023-05-24 DIAGNOSIS — N182 Chronic kidney disease, stage 2 (mild): Secondary | ICD-10-CM | POA: Diagnosis not present

## 2023-05-24 DIAGNOSIS — I10 Essential (primary) hypertension: Secondary | ICD-10-CM | POA: Diagnosis not present

## 2023-05-24 LAB — RENAL FUNCTION PANEL
Albumin: 3.3 g/dL — ABNORMAL LOW (ref 3.5–5.0)
Anion gap: 8 (ref 5–15)
BUN: 5 mg/dL — ABNORMAL LOW (ref 6–20)
CO2: 22 mmol/L (ref 22–32)
Calcium: 8.7 mg/dL — ABNORMAL LOW (ref 8.9–10.3)
Chloride: 106 mmol/L (ref 98–111)
Creatinine, Ser: 0.81 mg/dL (ref 0.44–1.00)
GFR, Estimated: >60 mL/min (ref >60)
Glucose, Bld: 84 mg/dL (ref 70–99)
Phosphorus: 2.8 mg/dL (ref 2.5–4.6)
Potassium: 3.3 mmol/L — ABNORMAL LOW (ref 3.5–5.1)
Sodium: 136 mmol/L (ref 135–145)

## 2023-05-24 LAB — CBC
HCT: 32 % — ABNORMAL LOW (ref 36.0–46.0)
Hemoglobin: 10.3 g/dL — ABNORMAL LOW (ref 12.0–15.0)
MCH: 29.2 pg (ref 26.0–34.0)
MCHC: 32.2 g/dL (ref 30.0–36.0)
MCV: 90.7 fL (ref 80.0–100.0)
Platelets: 181 10*3/uL (ref 150–400)
RBC: 3.53 MIL/uL — ABNORMAL LOW (ref 3.87–5.11)
RDW: 13.5 % (ref 11.5–15.5)
WBC: 7 10*3/uL (ref 4.0–10.5)
nRBC: 0 % (ref 0.0–0.2)

## 2023-05-24 LAB — MAGNESIUM: Magnesium: 2.1 mg/dL (ref 1.7–2.4)

## 2023-05-24 MED ORDER — POTASSIUM CHLORIDE CRYS ER 20 MEQ PO TBCR
40.0000 meq | EXTENDED_RELEASE_TABLET | ORAL | Status: AC
  Administered 2023-05-24 (×2): 40 meq via ORAL
  Filled 2023-05-24 (×2): qty 2

## 2023-05-24 NOTE — Progress Notes (Signed)
SATURATION QUALIFICATIONS: (This note is used to comply with regulatory documentation for home oxygen)  Patient Saturations on Room Air at Rest = 100%  Patient Saturations on Room Air while Ambulating = 100%  

## 2023-05-24 NOTE — Plan of Care (Signed)
  Problem: Education: Goal: Knowledge of General Education information will improve Description: Including pain rating scale, medication(s)/side effects and non-pharmacologic comfort measures Outcome: Completed/Met   Problem: Clinical Measurements: Goal: Respiratory complications will improve Outcome: Completed/Met Goal: Cardiovascular complication will be avoided Outcome: Completed/Met   

## 2023-05-24 NOTE — Progress Notes (Signed)
PROGRESS NOTE  KING TIER WGN:[id number] DOB: 01-Mar-1976   PCP: Glendale Chard, DO  Patient is from: Home.  DOA: 05/22/2023 LOS: 1  Chief complaints Chief Complaint  Patient presents with   Emesis     Brief Narrative / Interim history: 47 year old F with PMH of chronic nausea and vomiting, HTN, anemia, GERD, esophagitis and cholecystectomy presenting with intractable nausea and vomiting for 2 to 3 weeks, and admitted for the same.  Vitals and workup including CMP, CBC, lipase, lactic acid, UDS and Pro-Cal unrevealing.  Pregnancy test negative.  Recent CT on 8/30 without acute finding to explain patient's symptoms.  UA with ketonuria persistently.  BHA was 2.18.  Patient is not diabetic.  TSH slightly elevated to 7.3.  She reports compliance with Reglan and Aciphex at home.  Started on IV fluid, scheduled and as needed Reglan and clear liquid diet and admitted for further evaluation and care.  [facility] following.    Subjective: Seen and examined earlier this morning.  No major events overnight of this morning.  Continues to endorse significant abdominal pain after drinking or eating anything.  Pain is diffuse and all over.  Also reports nausea but no emesis.  Patient's husband at bedside.  Objective: Vitals:   05/23/23 2119 05/24/23 0519 05/24/23 1248 05/24/23 1308  BP: 114/76 108/73  128/85  Pulse: 73 84  75  Resp: 16 18  16   Temp: 98.5 F (36.9 C) 98.6 F (37 C)  (!) 97.4 F (36.3 C)  TempSrc: Oral Oral  Oral  SpO2: 100% 100% 100% 99%  Weight:      Height:        Examination:  GENERAL: No apparent distress.  Nontoxic. HEENT: MMM.  Vision and hearing grossly intact.  NECK: Supple.  No apparent JVD.  RESP:  No IWOB.  Fair aeration bilaterally. CVS:  RRR. Heart sounds normal.  ABD/GI/GU: BS+. Abd soft.  Diffuse tenderness. MSK/EXT:  Moves extremities. No apparent deformity. No edema.  SKIN: no apparent skin lesion or wound NEURO: Awake, alert and oriented  appropriately.  No apparent focal neuro deficit. PSYCH: Calm. Normal affect.   Procedures:  None  Microbiology summarized: None  Assessment and plan: Principal Problem:   Intractable nausea and vomiting Active Problems:   HYPERTENSION, BENIGN SYSTEMIC   CKD (chronic kidney disease) stage 2, GFR 60-89 ml/min   Intractable acute on chronic nausea, vomiting and postprandial abdominal pain: Workup including basic labs and CT abdomen and pelvis unrevealing except for ketonuria, elevated BHA and mildly elevated TSH.  A1c 5.7%.  Glucose within normal.  She had cholecystectomy about a year ago.  EGD in 2023 with congested, erythematous unchanged mucosa in the antrum and prepyloric region of the stomach.  Pathology with reactive gastropathy.  Followed by [facility].  Gastropathy? -Continue supportive care-with scheduled and as needed Reglan, IV Protonix and IVF -Agree with minimizing narcotics.  Reduce Norco to every 6 hours as needed -Continue full liquid diet -Follow GI recommendation  Essential hypertension: BP acceptable for most part. -Resume home amlodipine  Normocytic anemia: Stable -Monitor  Elevated TSH: Free T4 normal.  GERD/esophagitis -PPI as above  Hypokalemia -Monitor replenish as appropriate  Ketonuria: Likely due to poor p.o. intake and dehydration.  A1c 5.7%.  Obesity Body mass index is 34.6 kg/m. -Encourage lifestyle change to lose weight          DVT prophylaxis:  SCDs Start: 05/23/23 7846  Code Status: Full code Family Communication: None at bedside Level of  care: Med-Surg Status is: Inpatient The patient will remain inpatient because: Intractable nausea and vomiting and abdominal pain   Final disposition: Home Consultants:  [facility]  35 minutes with more than 50% spent in reviewing records, counseling patient/family and coordinating care.   Sch Meds:  Scheduled Meds:  amLODipine  10 mg Oral QHS   famotidine  20 mg Oral BID   feeding  supplement  1 Container Oral TID BM   metoCLOPramide (REGLAN) injection  5 mg Intravenous Q8H   pantoprazole (PROTONIX) IV  40 mg Intravenous Q12H   spironolactone  50 mg Oral QHS   Continuous Infusions:  sodium chloride 100 mL/hr at 05/24/23 0659   PRN Meds:.acetaminophen **OR** acetaminophen, HYDROcodone-acetaminophen, loperamide, metoCLOPramide (REGLAN) injection, ondansetron **OR** ondansetron (ZOFRAN) IV  Antimicrobials: Anti-infectives (From admission, onward)    None        I have personally reviewed the following labs and images: CBC: Recent Labs  Lab 05/20/23 1409 05/21/23 1944 05/22/23 1426 05/23/23 0511 05/24/23 0413  WBC 6.4 6.3 6.8 6.2 7.0  HGB 12.6 12.6 12.1 11.4* 10.3*  HCT 37.3 38.4 37.9 35.1* 32.0*  MCV 86.7 87.5 89.6 90.7 90.7  PLT 250 233 230 218 181   BMP &GFR Recent Labs  Lab 05/18/23 0822 05/20/23 1409 05/21/23 1944 05/22/23 1426 05/23/23 0511 05/24/23 0413  NA  --  137 135 135 135 136  K  --  3.7 3.8 3.6 3.5 3.3*  CL  --  100 101 105 104 106  CO2  --  26 21* 18* 19* 22  GLUCOSE  --  91 93 89 89 84  BUN  --  8 5* <5* 5* 5*  CREATININE  --  0.97 0.97 0.84 0.80 0.81  CALCIUM  --  10.2 9.9 9.5 9.2 8.7*  MG 2.3  --   --   --  2.2 2.1  PHOS  --   --   --   --  2.9 2.8   Estimated Creatinine Clearance: 89.2 mL/min (by C-G formula based on SCr of 0.81 mg/dL). Liver & Pancreas: Recent Labs  Lab 05/20/23 1409 05/21/23 1944 05/22/23 1426 05/23/23 0511 05/24/23 0413  AST 14* 17 14* 13*  --   ALT 16 18 16 14   --   ALKPHOS 47 46 43 42  --   BILITOT 0.7 0.7 0.8 0.8  --   PROT 8.5* 8.6* 8.4* 7.6  --   ALBUMIN 4.6 4.5 4.3 3.9 3.3*   Recent Labs  Lab 05/20/23 1409 05/21/23 1944 05/22/23 1426  LIPASE 22 27 29    No results for input(s): "AMMONIA" in the last 168 hours. Diabetic: Recent Labs    05/23/23 0504  HGBA1C 5.7*   No results for input(s): "GLUCAP" in the last 168 hours. Cardiac Enzymes: Recent Labs  Lab 05/23/23 0511   CKTOTAL 73   Recent Labs    05/18/23 0822  PROBNP <36   Coagulation Profile: No results for input(s): "INR", "PROTIME" in the last 168 hours. Thyroid Function Tests: Recent Labs    05/23/23 0511 05/23/23 0751  TSH 7.278*  --   FREET4  --  0.72   Lipid Profile: No results for input(s): "CHOL", "HDL", "LDLCALC", "TRIG", "CHOLHDL", "LDLDIRECT" in the last 72 hours. Anemia Panel: No results for input(s): "VITAMINB12", "FOLATE", "FERRITIN", "TIBC", "IRON", "RETICCTPCT" in the last 72 hours. Urine analysis:    Component Value Date/Time   COLORURINE YELLOW 05/22/2023 2240   APPEARANCEUR HAZY (A) 05/22/2023 2240   LABSPEC 1.015 05/22/2023  2240   PHURINE 5.0 05/22/2023 2240   GLUCOSEU NEGATIVE 05/22/2023 2240   HGBUR NEGATIVE 05/22/2023 2240   BILIRUBINUR NEGATIVE 05/22/2023 2240   BILIRUBINUR negative 04/19/2023 1602   KETONESUR 80 (A) 05/22/2023 2240   PROTEINUR NEGATIVE 05/22/2023 2240   UROBILINOGEN 0.2 04/19/2023 1602   UROBILINOGEN 0.2 07/04/2020 0852   NITRITE NEGATIVE 05/22/2023 2240   LEUKOCYTESUR NEGATIVE 05/22/2023 2240   Sepsis Labs: Invalid input(s): "PROCALCITONIN", "LACTICIDVEN"  Microbiology: Recent Results (from the past 240 hour(s))  Gastrointestinal Panel by PCR , Stool     Status: None   Collection Time: 05/20/23  4:04 PM   Specimen: Stool  Result Value Ref Range Status   Campylobacter species NOT DETECTED NOT DETECTED Final   Plesimonas shigelloides NOT DETECTED NOT DETECTED Final   Salmonella species NOT DETECTED NOT DETECTED Final   Yersinia enterocolitica NOT DETECTED NOT DETECTED Final   Vibrio species NOT DETECTED NOT DETECTED Final   Vibrio cholerae NOT DETECTED NOT DETECTED Final   Enteroaggregative E coli (EAEC) NOT DETECTED NOT DETECTED Final   Enteropathogenic E coli (EPEC) NOT DETECTED NOT DETECTED Final   Enterotoxigenic E coli (ETEC) NOT DETECTED NOT DETECTED Final   Shiga like toxin producing E coli (STEC) NOT DETECTED NOT DETECTED  Final   Shigella/Enteroinvasive E coli (EIEC) NOT DETECTED NOT DETECTED Final   Cryptosporidium NOT DETECTED NOT DETECTED Final   Cyclospora cayetanensis NOT DETECTED NOT DETECTED Final   Entamoeba histolytica NOT DETECTED NOT DETECTED Final   Giardia lamblia NOT DETECTED NOT DETECTED Final   Adenovirus F40/41 NOT DETECTED NOT DETECTED Final   Astrovirus NOT DETECTED NOT DETECTED Final   Norovirus GI/GII NOT DETECTED NOT DETECTED Final   Rotavirus A NOT DETECTED NOT DETECTED Final   Sapovirus (I, II, IV, and V) NOT DETECTED NOT DETECTED Final    Comment: Performed at [facility], [address] [place], [place] [address]  C Difficile Quick Screen w PCR reflex     Status: None   Collection Time: 05/20/23  4:04 PM   Specimen: Stool  Result Value Ref Range Status   C Diff antigen NEGATIVE NEGATIVE Final   C Diff toxin NEGATIVE NEGATIVE Final   C Diff interpretation No C. difficile detected.  Final    Comment: Performed at [facility], [address] [place], [place] [address]    Radiology Studies: No results found.    Silas Muff T. Lacheryl Niesen [facility]  If 7PM-7AM, please contact night-coverage [email or web address] 05/24/2023, 3:27 PM

## 2023-05-24 NOTE — Plan of Care (Signed)
  Problem: Nutrition: Goal: Adequate nutrition will be maintained Outcome: Not Progressing   Problem: Education: Goal: Knowledge of General Education information will improve Description: Including pain rating scale, medication(s)/side effects and non-pharmacologic comfort measures Outcome: Completed/Met   Problem: Clinical Measurements: Goal: Respiratory complications will improve Outcome: Completed/Met Goal: Cardiovascular complication will be avoided Outcome: Completed/Met

## 2023-05-25 ENCOUNTER — Inpatient Hospital Stay (HOSPITAL_COMMUNITY): Payer: Medicaid Other

## 2023-05-25 ENCOUNTER — Telehealth: Payer: Self-pay | Admitting: Nurse Practitioner

## 2023-05-25 DIAGNOSIS — K2289 Other specified disease of esophagus: Secondary | ICD-10-CM | POA: Diagnosis not present

## 2023-05-25 DIAGNOSIS — K2970 Gastritis, unspecified, without bleeding: Secondary | ICD-10-CM | POA: Diagnosis not present

## 2023-05-25 DIAGNOSIS — R197 Diarrhea, unspecified: Secondary | ICD-10-CM | POA: Diagnosis not present

## 2023-05-25 DIAGNOSIS — K219 Gastro-esophageal reflux disease without esophagitis: Secondary | ICD-10-CM

## 2023-05-25 DIAGNOSIS — R109 Unspecified abdominal pain: Secondary | ICD-10-CM | POA: Diagnosis not present

## 2023-05-25 DIAGNOSIS — N182 Chronic kidney disease, stage 2 (mild): Secondary | ICD-10-CM | POA: Diagnosis not present

## 2023-05-25 DIAGNOSIS — R112 Nausea with vomiting, unspecified: Secondary | ICD-10-CM | POA: Diagnosis not present

## 2023-05-25 DIAGNOSIS — I10 Essential (primary) hypertension: Secondary | ICD-10-CM | POA: Diagnosis not present

## 2023-05-25 DIAGNOSIS — K449 Diaphragmatic hernia without obstruction or gangrene: Secondary | ICD-10-CM | POA: Diagnosis not present

## 2023-05-25 LAB — CBC
HCT: 33.3 % — ABNORMAL LOW (ref 36.0–46.0)
Hemoglobin: 10.7 g/dL — ABNORMAL LOW (ref 12.0–15.0)
MCH: 28.9 pg (ref 26.0–34.0)
MCHC: 32.1 g/dL (ref 30.0–36.0)
MCV: 90 fL (ref 80.0–100.0)
Platelets: 196 10*3/uL (ref 150–400)
RBC: 3.7 MIL/uL — ABNORMAL LOW (ref 3.87–5.11)
RDW: 13.3 % (ref 11.5–15.5)
WBC: 5.8 10*3/uL (ref 4.0–10.5)
nRBC: 0 % (ref 0.0–0.2)

## 2023-05-25 LAB — MAGNESIUM: Magnesium: 2.1 mg/dL (ref 1.7–2.4)

## 2023-05-25 LAB — RENAL FUNCTION PANEL
Albumin: 3.4 g/dL — ABNORMAL LOW (ref 3.5–5.0)
Anion gap: 7 (ref 5–15)
BUN: <5 mg/dL — ABNORMAL LOW (ref 6–20)
CO2: 22 mmol/L (ref 22–32)
Calcium: 9.1 mg/dL (ref 8.9–10.3)
Chloride: 107 mmol/L (ref 98–111)
Creatinine, Ser: 0.81 mg/dL (ref 0.44–1.00)
GFR, Estimated: >60 mL/min (ref >60)
Glucose, Bld: 93 mg/dL (ref 70–99)
Phosphorus: 2.6 mg/dL (ref 2.5–4.6)
Potassium: 3.6 mmol/L (ref 3.5–5.1)
Sodium: 136 mmol/L (ref 135–145)

## 2023-05-25 MED ORDER — SODIUM CHLORIDE 0.9 % IV SOLN: 12.5000 mg | Freq: Four times a day (QID) | INTRAVENOUS | Status: DC | PRN

## 2023-05-25 MED ORDER — METOCLOPRAMIDE HCL 5 MG PO TABS
5.0000 mg | ORAL_TABLET | Freq: Three times a day (TID) | ORAL | Status: DC
  Administered 2023-05-25: 5 mg via ORAL
  Filled 2023-05-25: qty 1

## 2023-05-25 MED ORDER — DICYCLOMINE HCL 10 MG PO CAPS
10.0000 mg | ORAL_CAPSULE | Freq: Three times a day (TID) | ORAL | Status: DC
  Administered 2023-05-25 – 2023-05-28 (×11): 10 mg via ORAL
  Filled 2023-05-25 (×11): qty 1

## 2023-05-25 MED ORDER — METOCLOPRAMIDE HCL 5 MG PO TABS
10.0000 mg | ORAL_TABLET | Freq: Three times a day (TID) | ORAL | Status: DC
  Administered 2023-05-25 – 2023-05-27 (×6): 10 mg via ORAL
  Filled 2023-05-25 (×6): qty 2

## 2023-05-25 MED ORDER — ONDANSETRON HCL 4 MG PO TABS: 4.0000 mg | ORAL_TABLET | Freq: Four times a day (QID) | ORAL | Status: DC | PRN

## 2023-05-25 MED ORDER — POTASSIUM CHLORIDE CRYS ER 20 MEQ PO TBCR
40.0000 meq | EXTENDED_RELEASE_TABLET | Freq: Once | ORAL | Status: AC
  Administered 2023-05-25: 40 meq via ORAL
  Filled 2023-05-25: qty 2

## 2023-05-25 MED ORDER — ONDANSETRON HCL 4 MG/2ML IJ SOLN
4.0000 mg | Freq: Four times a day (QID) | INTRAMUSCULAR | Status: DC | PRN
  Administered 2023-05-25: 4 mg via INTRAVENOUS
  Filled 2023-05-25: qty 2

## 2023-05-25 NOTE — Plan of Care (Signed)

## 2023-05-25 NOTE — Plan of Care (Signed)
  Problem: Activity: Goal: Risk for activity intolerance will decrease Outcome: Progressing   

## 2023-05-25 NOTE — Telephone Encounter (Signed)
PT is calling to let us know that she has been hospitalized as of Saturday for stomach pain, nausea and vomiting. Please advise.

## 2023-05-25 NOTE — Progress Notes (Signed)
Patient mistakenly dropped one Aldactone 50 mg to the floor. Discarded. Another Aldactone 50 mg obtained via Pyxis and administered to patient.

## 2023-05-25 NOTE — Progress Notes (Signed)
     [facility] Progress Note  CC:  Intractable nausea and vomiting, abdominal pain  Subjective:  Patient seen with family at bedside.  Called back to see the patient again in re-consult for ongoing nausea with vomiting and abdominal pain.  Has been unable to eat solid food, tried to eat a little bit of grits this AM and now she is extremely nauseated and belching, vomited a small amount.  Having diarrhea as well, about 3-4 BMs today.  Objective:  Vital signs in last 24 hours: Temp:  [97.4 F (36.3 C)-98.6 F (37 C)] 98.5 F (36.9 C) (09/03 0601) Pulse Rate:  [70-84] 84 (09/03 0601) Resp:  [16-18] 17 (09/03 0601) BP: (124-128)/(77-85) 124/77 (09/03 0601) SpO2:  [92 %-100 %] 99 % (09/03 0601) Last BM Date : 05/24/23 General:  Alert, Well-developed, in NAD Heart:  Regular rate and rhythm; no murmurs Pulm:  CTAB.  No W/R/R. Abdomen:  Soft, non-distended.  BS present.  Mild diffuse TTP. Extremities:  Without edema. Neurologic:  Alert and oriented x 4;  grossly normal neurologically. Psych:  Alert and cooperative. Normal mood and affect.  Intake/Output from previous day: 09/02 0701 - 09/03 0700 In: 3229.9 [P.O.:780; I.V.:2449.9] Out: -  Intake/Output this shift: Total I/O In: 458.3 [I.V.:458.3] Out: -   Lab Results: Recent Labs    05/23/23 0511 05/24/23 0413 05/25/23 0421  WBC 6.2 7.0 5.8  HGB 11.4* 10.3* 10.7*  HCT 35.1* 32.0* 33.3*  PLT 218 181 196   BMET Recent Labs    05/23/23 0511 05/24/23 0413 05/25/23 0421  NA 135 136 136  K 3.5 3.3* 3.6  CL 104 106 107  CO2 19* 22 22  GLUCOSE 89 84 93  BUN 5* 5* <5*  CREATININE 0.80 0.81 0.81  CALCIUM 9.2 8.7* 9.1   LFT Recent Labs    05/23/23 0511 05/24/23 0413 05/25/23 0421  PROT 7.6  --   --   ALBUMIN 3.9   < > 3.4*  AST 13*  --   --   ALT 14  --   --   ALKPHOS 42  --   --   BILITOT 0.8  --   --    < > = values in this interval not displayed.   Assessment / Plan: 47 year old female with  longstanding history of intractable nausea and vomiting with extensive workup including EGD showing H. pylori negative gastritis, scheduled for esophageal manometry December 2024.  GES negative.  Does not have a gallbladder.   Intractable nausea and vomiting with abdominal pain and diarrhea: -CT abdomen pelvis with contrast shows no acute findings.  Normal pancreas, normal liver, normal stomach, small bowel, and colon. - TSH 7.278  -Scheduled (not as needed) Reglan 10 Mg IV ACHS times daily, dose was just increased from 5 mg today. -Famotidine 20 Mg twice daily. -Continue Protonix IV 40 mg BID. -Minimize narcotics, these present a barrier to motility. -Will check an AM cortisol, a total T4, a T3, and an ionized Calcium in the morning.  Recheck EKG in AM as well. -Will add phenergan IV prn. -I added dicyclomine 10 mg ACHS as well since she had been on that before. -Stool studies including Cdiff, GI pathogen panel, and fecal calprotectin. -Continue prn Imodium for now.    LOS: 2 days   Princella Pellegrini. Lisseth Brazeau  05/25/2023, 12:13 PM

## 2023-05-25 NOTE — Progress Notes (Signed)
Patient returned from CT in no distress.

## 2023-05-25 NOTE — H&P (View-Only) (Signed)
     [facility] Progress Note  CC:  Intractable nausea and vomiting, abdominal pain  Subjective:  Patient seen with family at bedside.  Called back to see the patient again in re-consult for ongoing nausea with vomiting and abdominal pain.  Has been unable to eat solid food, tried to eat a little bit of grits this AM and now she is extremely nauseated and belching, vomited a small amount.  Having diarrhea as well, about 3-4 BMs today.  Objective:  Vital signs in last 24 hours: Temp:  [97.4 F (36.3 C)-98.6 F (37 C)] 98.5 F (36.9 C) (09/03 0601) Pulse Rate:  [70-84] 84 (09/03 0601) Resp:  [16-18] 17 (09/03 0601) BP: (124-128)/(77-85) 124/77 (09/03 0601) SpO2:  [92 %-100 %] 99 % (09/03 0601) Last BM Date : 05/24/23 General:  Alert, Well-developed, in NAD Heart:  Regular rate and rhythm; no murmurs Pulm:  CTAB.  No W/R/R. Abdomen:  Soft, non-distended.  BS present.  Mild diffuse TTP. Extremities:  Without edema. Neurologic:  Alert and oriented x 4;  grossly normal neurologically. Psych:  Alert and cooperative. Normal mood and affect.  Intake/Output from previous day: 09/02 0701 - 09/03 0700 In: 3229.9 [P.O.:780; I.V.:2449.9] Out: -  Intake/Output this shift: Total I/O In: 458.3 [I.V.:458.3] Out: -   Lab Results: Recent Labs    05/23/23 0511 05/24/23 0413 05/25/23 0421  WBC 6.2 7.0 5.8  HGB 11.4* 10.3* 10.7*  HCT 35.1* 32.0* 33.3*  PLT 218 181 196   BMET Recent Labs    05/23/23 0511 05/24/23 0413 05/25/23 0421  NA 135 136 136  K 3.5 3.3* 3.6  CL 104 106 107  CO2 19* 22 22  GLUCOSE 89 84 93  BUN 5* 5* <5*  CREATININE 0.80 0.81 0.81  CALCIUM 9.2 8.7* 9.1   LFT Recent Labs    05/23/23 0511 05/24/23 0413 05/25/23 0421  PROT 7.6  --   --   ALBUMIN 3.9   < > 3.4*  AST 13*  --   --   ALT 14  --   --   ALKPHOS 42  --   --   BILITOT 0.8  --   --    < > = values in this interval not displayed.   Assessment / Plan: 47 year old female with  longstanding history of intractable nausea and vomiting with extensive workup including EGD showing H. pylori negative gastritis, scheduled for esophageal manometry December 2024.  GES negative.  Does not have a gallbladder.   Intractable nausea and vomiting with abdominal pain and diarrhea: -CT abdomen pelvis with contrast shows no acute findings.  Normal pancreas, normal liver, normal stomach, small bowel, and colon. - TSH 7.278  -Scheduled (not as needed) Reglan 10 Mg IV ACHS times daily, dose was just increased from 5 mg today. -Famotidine 20 Mg twice daily. -Continue Protonix IV 40 mg BID. -Minimize narcotics, these present a barrier to motility. -Will check an AM cortisol, a total T4, a T3, and an ionized Calcium in the morning.  Recheck EKG in AM as well. -Will add phenergan IV prn. -I added dicyclomine 10 mg ACHS as well since she had been on that before. -Stool studies including Cdiff, GI pathogen panel, and fecal calprotectin. -Continue prn Imodium for now.    LOS: 2 days   Princella Pellegrini. Lisseth Brazeau  05/25/2023, 12:13 PM

## 2023-05-25 NOTE — Progress Notes (Signed)
Mobility Specialist - Progress Note   05/25/23 1518  Mobility  Activity Ambulated independently in hallway  Level of Assistance Independent  Assistive Device None  Distance Ambulated (ft) 700 ft  Range of Motion/Exercises Active  Activity Response Tolerated well  Mobility Referral Yes  $Mobility charge 1 Mobility  Mobility Specialist Start Time (ACUTE ONLY) 1505  Mobility Specialist Stop Time (ACUTE ONLY) 1518  Mobility Specialist Time Calculation (min) (ACUTE ONLY) 13 min   Pt was found on recliner chair and agreeable to ambulate. C/o abdominal discomfort. At EOS returned to recliner chair with all needs met. Family in room.  Billey Chang Mobility Specialist

## 2023-05-25 NOTE — Progress Notes (Signed)
PROGRESS NOTE  Tamara Church MWU:[id number] DOB: 1975/11/12   PCP: Glendale Chard, DO  Patient is from: Home.  DOA: 05/22/2023 LOS: 2  Chief complaints Chief Complaint  Patient presents with   Emesis     Brief Narrative / Interim history: 47 year old F with PMH of chronic nausea and vomiting, HTN, anemia, GERD, esophagitis and cholecystectomy presenting with intractable nausea and vomiting for 2 to 3 weeks, and admitted for the same.  Vitals and workup including CMP, CBC, lipase, lactic acid, UDS and Pro-Cal unrevealing.  Pregnancy test negative.  Recent CT on 8/30 without acute finding to explain patient's symptoms.  UA with ketonuria persistently.  BHA was 2.18.  Patient is not diabetic.  TSH slightly elevated to 7.3.  She reports compliance with Reglan and Aciphex at home.  Started on IV fluid, scheduled and as needed Reglan and clear liquid diet and admitted for further evaluation and care.  [facility] following.  Plan for endoscopy tomorrow.    Subjective: Seen and examined earlier this morning.  Continues to endorse significant abdominal pain and nausea even before eating.  Somewhat frustrated with lack of answer for her problem.  Had loose stool yesterday.  Small BM this morning.  No vomiting.  Objective: Vitals:   05/24/23 1308 05/24/23 2205 05/25/23 0601 05/25/23 1232  BP: 128/85 126/81 124/77 (!) 146/89  Pulse: 75 70 84 95  Resp: 16 18 17    Temp: (!) 97.4 F (36.3 C) 98.6 F (37 C) 98.5 F (36.9 C) 97.7 F (36.5 C)  TempSrc: Oral Oral Oral Oral  SpO2: 99% 92% 99% 99%  Weight:      Height:        Examination:  GENERAL: No apparent distress.  Nontoxic. HEENT: MMM.  Vision and hearing grossly intact.  NECK: Supple.  No apparent JVD.  RESP:  No IWOB.  Fair aeration bilaterally. CVS:  RRR. Heart sounds normal.  ABD/GI/GU: BS+. Abd soft.  Mild diffuse tenderness. MSK/EXT:  Moves extremities. No apparent deformity. No edema.  SKIN: no apparent skin lesion or  wound NEURO: Awake, alert and oriented appropriately.  No apparent focal neuro deficit. PSYCH: Calm. Normal affect.   Procedures:  None  Microbiology summarized: None  Assessment and plan: Principal Problem:   Intractable nausea and vomiting Active Problems:   HYPERTENSION, BENIGN SYSTEMIC   CKD (chronic kidney disease) stage 2, GFR 60-89 ml/min   Intractable acute on chronic nausea, vomiting and postprandial abdominal pain: Workup including basic labs and CT abdomen and pelvis unrevealing except for ketonuria, elevated BHA and mildly elevated TSH.  A1c 5.7%.  Glucose within normal.  She had cholecystectomy about a year ago.  EGD in 2023 with congested, erythematous unchanged mucosa in the antrum and prepyloric region of the stomach.  Pathology with reactive gastropathy.  Followed by [facility].  Gastropathy? -Continue supportive care-with scheduled and as needed Reglan, IV Protonix, Pepcid -Continue full liquid diet -Appreciate GI rec  -EGD  -Thyroid panel, cortisol, GIP, C. difficile, fecal calprotectin  Essential hypertension: BP acceptable for most part. -Resume home amlodipine  Normocytic anemia: Stable -Monitor  Elevated TSH: Free T4 normal.  GERD/esophagitis -PPI as above  Hypokalemia -Monitor replenish as appropriate  Ketonuria: Likely due to poor p.o. intake and dehydration.  A1c 5.7%.  Obesity Body mass index is 34.6 kg/m. -Encourage lifestyle change to lose weight          DVT prophylaxis:  SCDs Start: 05/23/23 7253  Code Status: Full code Family Communication: Updated patient's  son and daughter at bedside. Level of care: Med-Surg Status is: Inpatient The patient will remain inpatient because: Intractable nausea  and abdominal pain   Final disposition: Home Consultants:  [facility]  35 minutes with more than 50% spent in reviewing records, counseling patient/family and coordinating care.   Sch Meds:  Scheduled Meds:  amLODipine  10 mg  Oral QHS   dicyclomine  10 mg Oral TID AC & HS   famotidine  20 mg Oral BID   feeding supplement  1 Container Oral TID BM   metoCLOPramide  10 mg Oral TID AC & HS   pantoprazole (PROTONIX) IV  40 mg Intravenous Q12H   spironolactone  50 mg Oral QHS   Continuous Infusions:  promethazine (PHENERGAN) injection (IM or IVPB)     PRN Meds:.acetaminophen **OR** acetaminophen, loperamide, ondansetron **OR** ondansetron (ZOFRAN) IV, promethazine (PHENERGAN) injection (IM or IVPB)  Antimicrobials: Anti-infectives (From admission, onward)    None        I have personally reviewed the following labs and images: CBC: Recent Labs  Lab 05/21/23 1944 05/22/23 1426 05/23/23 0511 05/24/23 0413 05/25/23 0421  WBC 6.3 6.8 6.2 7.0 5.8  HGB 12.6 12.1 11.4* 10.3* 10.7*  HCT 38.4 37.9 35.1* 32.0* 33.3*  MCV 87.5 89.6 90.7 90.7 90.0  PLT 233 230 218 181 196   BMP &GFR Recent Labs  Lab 05/21/23 1944 05/22/23 1426 05/23/23 0511 05/24/23 0413 05/25/23 0421  NA 135 135 135 136 136  K 3.8 3.6 3.5 3.3* 3.6  CL 101 105 104 106 107  CO2 21* 18* 19* 22 22  GLUCOSE 93 89 89 84 93  BUN 5* <5* 5* 5* <5*  CREATININE 0.97 0.84 0.80 0.81 0.81  CALCIUM 9.9 9.5 9.2 8.7* 9.1  MG  --   --  2.2 2.1 2.1  PHOS  --   --  2.9 2.8 2.6   Estimated Creatinine Clearance: 89.2 mL/min (by C-G formula based on SCr of 0.81 mg/dL). Liver & Pancreas: Recent Labs  Lab 05/20/23 1409 05/21/23 1944 05/22/23 1426 05/23/23 0511 05/24/23 0413 05/25/23 0421  AST 14* 17 14* 13*  --   --   ALT 16 18 16 14   --   --   ALKPHOS 47 46 43 42  --   --   BILITOT 0.7 0.7 0.8 0.8  --   --   PROT 8.5* 8.6* 8.4* 7.6  --   --   ALBUMIN 4.6 4.5 4.3 3.9 3.3* 3.4*   Recent Labs  Lab 05/20/23 1409 05/21/23 1944 05/22/23 1426  LIPASE 22 27 29    No results for input(s): "AMMONIA" in the last 168 hours. Diabetic: Recent Labs    05/23/23 0504  HGBA1C 5.7*   No results for input(s): "GLUCAP" in the last 168  hours. Cardiac Enzymes: Recent Labs  Lab 05/23/23 0511  CKTOTAL 73   Recent Labs    05/18/23 0822  PROBNP <36   Coagulation Profile: No results for input(s): "INR", "PROTIME" in the last 168 hours. Thyroid Function Tests: Recent Labs    05/23/23 0511 05/23/23 0751  TSH 7.278*  --   FREET4  --  0.72   Lipid Profile: No results for input(s): "CHOL", "HDL", "LDLCALC", "TRIG", "CHOLHDL", "LDLDIRECT" in the last 72 hours. Anemia Panel: No results for input(s): "VITAMINB12", "FOLATE", "FERRITIN", "TIBC", "IRON", "RETICCTPCT" in the last 72 hours. Urine analysis:    Component Value Date/Time   COLORURINE YELLOW 05/22/2023 2240   APPEARANCEUR HAZY (A) 05/22/2023 2240  LABSPEC 1.015 05/22/2023 2240   PHURINE 5.0 05/22/2023 2240   GLUCOSEU NEGATIVE 05/22/2023 2240   HGBUR NEGATIVE 05/22/2023 2240   BILIRUBINUR NEGATIVE 05/22/2023 2240   BILIRUBINUR negative 04/19/2023 1602   KETONESUR 80 (A) 05/22/2023 2240   PROTEINUR NEGATIVE 05/22/2023 2240   UROBILINOGEN 0.2 04/19/2023 1602   UROBILINOGEN 0.2 07/04/2020 0852   NITRITE NEGATIVE 05/22/2023 2240   LEUKOCYTESUR NEGATIVE 05/22/2023 2240   Sepsis Labs: Invalid input(s): "PROCALCITONIN", "LACTICIDVEN"  Microbiology: Recent Results (from the past 240 hour(s))  Gastrointestinal Panel by PCR , Stool     Status: None   Collection Time: 05/20/23  4:04 PM   Specimen: Stool  Result Value Ref Range Status   Campylobacter species NOT DETECTED NOT DETECTED Final   Plesimonas shigelloides NOT DETECTED NOT DETECTED Final   Salmonella species NOT DETECTED NOT DETECTED Final   Yersinia enterocolitica NOT DETECTED NOT DETECTED Final   Vibrio species NOT DETECTED NOT DETECTED Final   Vibrio cholerae NOT DETECTED NOT DETECTED Final   Enteroaggregative E coli (EAEC) NOT DETECTED NOT DETECTED Final   Enteropathogenic E coli (EPEC) NOT DETECTED NOT DETECTED Final   Enterotoxigenic E coli (ETEC) NOT DETECTED NOT DETECTED Final   Shiga  like toxin producing E coli (STEC) NOT DETECTED NOT DETECTED Final   Shigella/Enteroinvasive E coli (EIEC) NOT DETECTED NOT DETECTED Final   Cryptosporidium NOT DETECTED NOT DETECTED Final   Cyclospora cayetanensis NOT DETECTED NOT DETECTED Final   Entamoeba histolytica NOT DETECTED NOT DETECTED Final   Giardia lamblia NOT DETECTED NOT DETECTED Final   Adenovirus F40/41 NOT DETECTED NOT DETECTED Final   Astrovirus NOT DETECTED NOT DETECTED Final   Norovirus GI/GII NOT DETECTED NOT DETECTED Final   Rotavirus A NOT DETECTED NOT DETECTED Final   Sapovirus (I, II, IV, and V) NOT DETECTED NOT DETECTED Final    Comment: Performed at [facility], [address] [place], [place] [address]  C Difficile Quick Screen w PCR reflex     Status: None   Collection Time: 05/20/23  4:04 PM   Specimen: Stool  Result Value Ref Range Status   C Diff antigen NEGATIVE NEGATIVE Final   C Diff toxin NEGATIVE NEGATIVE Final   C Diff interpretation No C. difficile detected.  Final    Comment: Performed at [facility], [address] [place], [place] [address]    Radiology Studies: No results found.    Vannia Pola T. Zaiden Ludlum [facility]  If 7PM-7AM, please contact night-coverage [email or web address] 05/25/2023, 5:55 PM

## 2023-05-25 NOTE — TOC CM/SW Note (Signed)
Transition of Care [facility]) - Inpatient Brief Assessment   Patient Details  Name: Tamara Church MRN: [id number] Date of Birth: Apr 22, 1976  Transition of Care [employer]) CM/SW Contact:    Larrie Kass, LCSW Phone Number: 05/25/2023, 4:21 PM   Clinical Narrative:  Transition of Care Department [facility]) has reviewed patient and no TOC needs have been identified at this time. We will continue to monitor patient advancement through interdisciplinary progression rounds. If new patient transition needs arise, please place a TOC consult.  Transition of Care Asessment: Insurance and Status: Insurance coverage has been reviewed Patient has primary care physician: Yes Home environment has been reviewed: home with self Prior level of function:: independent Prior/Current Home Services: No current home services Social Determinants of Health Reivew: SDOH reviewed no interventions necessary Readmission risk has been reviewed: Yes Transition of care needs: no transition of care needs at this time

## 2023-05-26 ENCOUNTER — Inpatient Hospital Stay (HOSPITAL_COMMUNITY): Payer: Medicaid Other | Admitting: Certified Registered"

## 2023-05-26 ENCOUNTER — Encounter (HOSPITAL_COMMUNITY): Payer: Self-pay | Admitting: Student

## 2023-05-26 ENCOUNTER — Encounter (HOSPITAL_COMMUNITY)
Admission: EM | Disposition: A | Discharge status: Home / Self Care | Payer: Self-pay | Source: Home / Self Care | Attending: Student

## 2023-05-26 DIAGNOSIS — D649 Anemia, unspecified: Secondary | ICD-10-CM | POA: Diagnosis not present

## 2023-05-26 DIAGNOSIS — R112 Nausea with vomiting, unspecified: Secondary | ICD-10-CM | POA: Diagnosis not present

## 2023-05-26 DIAGNOSIS — E0781 Sick-euthyroid syndrome: Secondary | ICD-10-CM | POA: Diagnosis not present

## 2023-05-26 DIAGNOSIS — K2970 Gastritis, unspecified, without bleeding: Secondary | ICD-10-CM | POA: Diagnosis not present

## 2023-05-26 DIAGNOSIS — Z6834 Body mass index (BMI) 34.0-34.9, adult: Secondary | ICD-10-CM

## 2023-05-26 DIAGNOSIS — R109 Unspecified abdominal pain: Secondary | ICD-10-CM | POA: Diagnosis not present

## 2023-05-26 DIAGNOSIS — E669 Obesity, unspecified: Secondary | ICD-10-CM

## 2023-05-26 DIAGNOSIS — K2289 Other specified disease of esophagus: Secondary | ICD-10-CM

## 2023-05-26 DIAGNOSIS — K449 Diaphragmatic hernia without obstruction or gangrene: Secondary | ICD-10-CM

## 2023-05-26 DIAGNOSIS — E860 Dehydration: Secondary | ICD-10-CM | POA: Diagnosis not present

## 2023-05-26 DIAGNOSIS — R102 Pelvic and perineal pain: Secondary | ICD-10-CM | POA: Diagnosis not present

## 2023-05-26 DIAGNOSIS — R197 Diarrhea, unspecified: Secondary | ICD-10-CM | POA: Diagnosis not present

## 2023-05-26 DIAGNOSIS — I129 Hypertensive chronic kidney disease with stage 1 through stage 4 chronic kidney disease, or unspecified chronic kidney disease: Secondary | ICD-10-CM | POA: Diagnosis not present

## 2023-05-26 DIAGNOSIS — N182 Chronic kidney disease, stage 2 (mild): Secondary | ICD-10-CM | POA: Diagnosis not present

## 2023-05-26 DIAGNOSIS — Z79899 Other long term (current) drug therapy: Secondary | ICD-10-CM | POA: Diagnosis not present

## 2023-05-26 DIAGNOSIS — K219 Gastro-esophageal reflux disease without esophagitis: Secondary | ICD-10-CM | POA: Diagnosis not present

## 2023-05-26 DIAGNOSIS — R1115 Cyclical vomiting syndrome unrelated to migraine: Secondary | ICD-10-CM | POA: Diagnosis not present

## 2023-05-26 DIAGNOSIS — K222 Esophageal obstruction: Secondary | ICD-10-CM | POA: Diagnosis not present

## 2023-05-26 DIAGNOSIS — E876 Hypokalemia: Secondary | ICD-10-CM | POA: Diagnosis not present

## 2023-05-26 DIAGNOSIS — T82848A Pain from vascular prosthetic devices, implants and grafts, initial encounter: Secondary | ICD-10-CM | POA: Diagnosis not present

## 2023-05-26 HISTORY — PX: ESOPHAGOGASTRODUODENOSCOPY (EGD) WITH PROPOFOL: SHX5813

## 2023-05-26 HISTORY — PX: BIOPSY: SHX5522

## 2023-05-26 LAB — RENAL FUNCTION PANEL
Albumin: 3.6 g/dL (ref 3.5–5.0)
Anion gap: 10 (ref 5–15)
BUN: <5 mg/dL — ABNORMAL LOW (ref 6–20)
CO2: 23 mmol/L (ref 22–32)
Calcium: 9.5 mg/dL (ref 8.9–10.3)
Chloride: 101 mmol/L (ref 98–111)
Creatinine, Ser: 0.89 mg/dL (ref 0.44–1.00)
GFR, Estimated: >60 mL/min (ref >60)
Glucose, Bld: 98 mg/dL (ref 70–99)
Phosphorus: 4 mg/dL (ref 2.5–4.6)
Potassium: 3.3 mmol/L — ABNORMAL LOW (ref 3.5–5.1)
Sodium: 134 mmol/L — ABNORMAL LOW (ref 135–145)

## 2023-05-26 LAB — CBC
HCT: 34.4 % — ABNORMAL LOW (ref 36.0–46.0)
Hemoglobin: 11.1 g/dL — ABNORMAL LOW (ref 12.0–15.0)
MCH: 28.8 pg (ref 26.0–34.0)
MCHC: 32.3 g/dL (ref 30.0–36.0)
MCV: 89.1 fL (ref 80.0–100.0)
Platelets: 212 10*3/uL (ref 150–400)
RBC: 3.86 MIL/uL — ABNORMAL LOW (ref 3.87–5.11)
RDW: 13.3 % (ref 11.5–15.5)
WBC: 4.9 10*3/uL (ref 4.0–10.5)
nRBC: 0 % (ref 0.0–0.2)

## 2023-05-26 LAB — MAGNESIUM: Magnesium: 1.9 mg/dL (ref 1.7–2.4)

## 2023-05-26 LAB — CORTISOL-AM, BLOOD: Cortisol - AM: 8.5 ug/dL (ref 6.7–22.6)

## 2023-05-26 SURGERY — ESOPHAGOGASTRODUODENOSCOPY (EGD) WITH PROPOFOL
Anesthesia: Monitor Anesthesia Care

## 2023-05-26 MED ORDER — POTASSIUM CHLORIDE IN NACL 40-0.9 MEQ/L-% IV SOLN
INTRAVENOUS | Status: AC
  Filled 2023-05-26 (×2): qty 1000

## 2023-05-26 MED ORDER — PROPOFOL 500 MG/50ML IV EMUL
INTRAVENOUS | Status: DC | PRN
  Administered 2023-05-26: 100 ug/kg/min via INTRAVENOUS

## 2023-05-26 MED ORDER — SODIUM CHLORIDE 0.9 % IV SOLN: INTRAVENOUS | Status: DC

## 2023-05-26 MED ORDER — LACTATED RINGERS IV SOLN: INTRAVENOUS | Status: DC | PRN

## 2023-05-26 MED ORDER — PROPOFOL 10 MG/ML IV BOLUS
INTRAVENOUS | Status: DC | PRN
  Administered 2023-05-26 (×2): 20 mg via INTRAVENOUS
  Administered 2023-05-26: 70 mg via INTRAVENOUS
  Administered 2023-05-26: 20 mg via INTRAVENOUS

## 2023-05-26 MED ORDER — LIDOCAINE 2% (20 MG/ML) 5 ML SYRINGE
INTRAMUSCULAR | Status: DC | PRN
  Administered 2023-05-26: 60 mg via INTRAVENOUS

## 2023-05-26 SURGICAL SUPPLY — 15 items

## 2023-05-26 NOTE — Anesthesia Preprocedure Evaluation (Addendum)
Anesthesia Evaluation  Patient identified by MRN, date of birth, ID band Patient awake    Reviewed: Allergy & Precautions, H&P , NPO status , Patient's Chart, lab work & pertinent test results  Airway Mallampati: II   Neck ROM: full    Dental   Pulmonary neg pulmonary ROS   breath sounds clear to auscultation       Cardiovascular hypertension, Pt. on medications  Rhythm:regular Rate:Normal     Neuro/Psych negative neurological ROS  negative psych ROS   GI/Hepatic Neg liver ROS,GERD  Controlled,,PMH of chronic nausea and vomiting, HTN, anemia, GERD, esophagitis and cholecystectomy presenting with intractable nausea and vomiting for 2 to 3 weeks   Endo/Other  Obesity BMI 35  Renal/GU negative Renal ROS  negative genitourinary   Musculoskeletal negative musculoskeletal ROS (+)    Abdominal   Peds  Hematology  (+) Blood dyscrasia, anemia Hb 11.1, plt 212   Anesthesia Other Findings   Reproductive/Obstetrics negative OB ROS                             Anesthesia Physical Anesthesia Plan  ASA: 2  Anesthesia Plan: MAC   Post-op Pain Management:    Induction: Intravenous  PONV Risk Score and Plan: 2 and Propofol infusion, TIVA and Treatment may vary due to age or medical condition  Airway Management Planned: Natural Airway and Simple Face Mask  Additional Equipment: None  Intra-op Plan:   Post-operative Plan:   Informed Consent: I have reviewed the patients History and Physical, chart, labs and discussed the procedure including the risks, benefits and alternatives for the proposed anesthesia with the patient or authorized representative who has indicated his/her understanding and acceptance.     Dental advisory given  Plan Discussed with: CRNA, Anesthesiologist and Surgeon  Anesthesia Plan Comments:        Anesthesia Quick Evaluation

## 2023-05-26 NOTE — Plan of Care (Signed)
  Problem: Clinical Measurements: Goal: Will remain free from infection Outcome: Progressing Goal: Diagnostic test results will improve Outcome: Progressing   Problem: Activity: Goal: Risk for activity intolerance will decrease Outcome: Progressing   

## 2023-05-26 NOTE — Op Note (Signed)
[facility] Patient Name: Tamara Church Procedure Date: 05/26/2023 MRN: [id number] Attending MD: [name] , MD, [id number] Date of Birth: [birth year] CSN: [id number] Age: 47 Admit Type: Inpatient Procedure:                Upper GI endoscopy Indications:              Epigastric abdominal pain, Nausea with vomiting Providers:                [name], MD, Adin Hector, RN, Sunday Corn                            [place], Technician Referring MD:             Inpatient Medical Service Medicines:                Monitored Anesthesia Care Complications:            No immediate complications. Estimated Blood Loss:     Estimated blood loss was minimal. Procedure:                Pre-Anesthesia Assessment:                           - Prior to the procedure, a History and Physical                            was performed, and patient medications and                            allergies were reviewed. The patient's tolerance of                            previous anesthesia was also reviewed. The risks                            and benefits of the procedure and the sedation                            options and risks were discussed with the patient.                            All questions were answered, and informed consent                            was obtained. Prior Anticoagulants: The patient has                            taken no anticoagulant or antiplatelet agents. ASA                            Grade Assessment: III - A patient with severe                            systemic disease. After reviewing the risks and  benefits, the patient was deemed in satisfactory                            condition to undergo the procedure.                           After obtaining informed consent, the endoscope was                            passed under direct vision. Throughout the                            procedure, the patient's blood  pressure, pulse, and                            oxygen saturations were monitored continuously. The                            [id number]-[id number] ([id number]) Olympus endoscope was introduced                            through the mouth, and advanced to the second part                            of duodenum. The upper GI endoscopy was                            accomplished without difficulty. The patient                            tolerated the procedure. Scope In: Scope Out: Findings:      No gross lesions were noted in the entire esophagus. Biopsies were taken       with a cold forceps for histology to rule out EOE/LOE.      The Z-line was irregular and was found 39 cm from the incisors.      A 2 cm hiatal hernia was present.      Patchy mildly erythematous mucosa without bleeding was found in the       entire examined stomach. Biopsies were taken with a cold forceps for       histology and Helicobacter pylori testing.      No gross lesions were noted in the duodenal bulb, in the first portion       of the duodenum and in the second portion of the duodenum. Impression:               - No gross lesions in the entire esophagus.                            Biopsied.                           - Z-line irregular, 39 cm from the incisors.                           - 2 cm hiatal hernia.                           -  Erythematous mucosa in the stomach. Biopsied.                           - No gross lesions in the duodenal bulb, in the                            first portion of the duodenum and in the second                            portion of the duodenum. Moderate Sedation:      Not Applicable - Patient had care per Anesthesia. Recommendation:           - The patient will be observed post-procedure,                            until all discharge criteria are met.                           - Return patient to hospital ward for ongoing care.                           - Full liquid diet today.                            - Continue present medications.                           - Await pathology results.                           - Query the use of colestipol or cholestyramine for                            possible bile salt gastritis.                           - May continue to use antiemetics as already                            ordered, hopefully we can start down titrating                            things in the course the next few days.                           - The findings and recommendations were discussed                            with the patient.                           - The findings and recommendations were discussed                            with the referring physician. Procedure Code(s):        ---  Professional ---                           [id number], Esophagogastroduodenoscopy, flexible,                            transoral; with biopsy, single or multiple Diagnosis Code(s):        --- Professional ---                           K22.89, Other specified disease of esophagus                           K44.9, Diaphragmatic hernia without obstruction or                            gangrene                           K31.89, Other diseases of stomach and duodenum                           R10.13, Epigastric pain                           R11.2, Nausea with vomiting, unspecified CPT copyright 2022 American Medical Association. All rights reserved. The codes documented in this report are preliminary and upon coder review may  be revised to meet current compliance requirements. [name], MD 05/26/2023 1:59:48 PM Number of Addenda: 0

## 2023-05-26 NOTE — Interval H&P Note (Signed)
History and Physical Interval Note:  05/26/2023 10:56 AM  Tamara Church  has presented today for surgery, with the diagnosis of Nausea and vomiting, abdominal pain.  The various methods of treatment have been discussed with the patient and family. After consideration of risks, benefits and other options for treatment, the patient has consented to  Procedure(s): ESOPHAGOGASTRODUODENOSCOPY (EGD) WITH PROPOFOL (N/A) as a surgical intervention.  The patient's history has been reviewed, patient examined, no change in status, stable for surgery.  I have reviewed the patient's chart and labs.  Questions were answered to the patient's satisfaction.     [employer]

## 2023-05-26 NOTE — Plan of Care (Signed)
  Problem: Health Behavior/Discharge Planning: Goal: Ability to manage health-related needs will improve Outcome: Progressing   Problem: Clinical Measurements: Goal: Will remain free from infection Outcome: Progressing   Problem: Coping: Goal: Level of anxiety will decrease Outcome: Progressing   Problem: Pain Managment: Goal: General experience of comfort will improve Outcome: Progressing   Problem: Safety: Goal: Ability to remain free from injury will improve Outcome: Progressing   Problem: Skin Integrity: Goal: Risk for impaired skin integrity will decrease Outcome: Progressing

## 2023-05-26 NOTE — Transfer of Care (Signed)
Immediate Anesthesia Transfer of Care Note  Patient: Tamara Church  Procedure(s) Performed: ESOPHAGOGASTRODUODENOSCOPY (EGD) WITH PROPOFOL BIOPSY  Patient Location: PACU  Anesthesia Type:MAC  Level of Consciousness: awake, alert , oriented, and patient cooperative  Airway & Oxygen Therapy: Patient Spontanous Breathing and Patient connected to face mask oxygen  Post-op Assessment: Report given to RN and Post -op Vital signs reviewed and stable  Post vital signs: Reviewed and stable  Last Vitals:  Vitals Value Taken Time  BP    Temp    Pulse    Resp    SpO2      Last Pain:  Vitals:   05/26/23 1240  TempSrc: Temporal  PainSc: 0-No pain      Patients Stated Pain Goal: 0 (05/25/23 2142)  Complications: No notable events documented.

## 2023-05-26 NOTE — Progress Notes (Signed)
[facility] PROGRESS NOTE    Progress Note  Tamara Church  DGL:[id number] DOB: July 09, 1976 DOA: 05/22/2023 PCP: Glendale Chard, DO     Brief Narrative:   Tamara Church is an 47 y.o. female past medical history of chronic nausea and vomiting, essential hypertension and esophagitis cholecystectomy comes in with intractable nausea and vomiting for 3 weeks, pregnancy test is negative CT scan of the abdomen pelvis showed no acute findings, TSH was 7.3 she reports compliant with her Reglan and Aciphex.  GI was consulted recommended endoscopy on 05/26/2023.  Assessment/Plan:   Intractable nausea and vomiting with postprandial abdominal pain: CT scan of the abdomen pelvis was unrevealing. A1c of 5.7. EGD in 2023 showed erythematous unchanged mucosa, pathology showed reactive gastropathy. She was started on IV Protonix Pepcid and a full liquid diet. GI was consulted recommended endoscopy on 05/26/2023.  Patient was Reglan twice a day. No mass narcotics. TSH 7.4, free T40.7 Beta hydroxybutyrate of 2.1 Cortisol 8.5. C. Difficile no bowel movements Procalcitonin is low yield, IV is nonreactive.  Essential hypertension: Resume amlodipine, blood pressure is well-controlled.  Normocytic anemia: Hemoglobin is stable.  Elevated TSH: Normal free T4 likely sick euthyroid syndrome. History of GERD/esophagitis: Continue PPI twice a day.  Hypokalemia: Potassium is low this morning replete orally recheck in the morning.   DVT prophylaxis: scd Family Communication:son Status is: Inpatient Remains inpatient appropriate because: Intractable nausea and vomiting.    Code Status:     Code Status Orders  (From admission, onward)           Start     Ordered   05/23/23 0058  Full code  Continuous       Question:  By:  Answer:  Consent: discussion documented in EHR   05/23/23 0059           Code Status History     Date Active Date Inactive Code Status Order ID Comments User  Context   04/03/2019 1646 04/05/2019 1719 Full Code [id number]  Jonah Blue, MD ED         IV Access:   Peripheral IV   Procedures and diagnostic studies:   CT HEAD WO CONTRAST ( )  Result Date: 05/26/2023 CLINICAL DATA:  Initial evaluation for chronic recurrent nausea and vomiting. EXAM: CT HEAD WITHOUT CONTRAST TECHNIQUE: Contiguous axial images were obtained from the base of the skull through the vertex without intravenous contrast. RADIATION DOSE REDUCTION: This exam was performed according to the departmental dose-optimization program which includes automated exposure control, adjustment of the mA and/or kV according to patient size and/or use of iterative reconstruction technique. COMPARISON:  Prior study from 04/15/2017. FINDINGS: Brain: Cerebral volume within normal limits. No acute intracranial hemorrhage. No acute large vessel territory infarct. No mass lesion, midline shift or mass effect. No hydrocephalus or extra-axial fluid collection. Empty sella noted. Vascular: No abnormal hyperdense vessel. Skull: Scalp soft tissues within normal limits.  Calvarium intact. Sinuses/Orbits: Small remote posttraumatic defect noted at the right lamina papyracea. Globes orbital soft tissues otherwise unremarkable. Scattered mucosal thickening noted about the right ethmoidal air cells. Visualized paranasal sinuses are otherwise clear. No mastoid effusion. Other: None. IMPRESSION: 1. No acute intracranial abnormality. 2. Empty sella, a nonspecific finding, but can be seen in the setting of idiopathic intracranial hypertension. 3. Otherwise normal head CT. Electronically Signed   By: Rise Mu M.D.   On: 05/26/2023 04:58     Medical Consultants:   None.   Subjective:    Tamara Church she relates no vomiting abdominal pain is improved.  Objective:    Vitals:   05/25/23 0601 05/25/23 1232 05/25/23 2048 05/26/23 0601  BP: 124/77 (!) 146/89 122/83 119/76  Pulse: 84 95 82 83   Resp: 17  18 17   Temp: 98.5 F (36.9 C) 97.7 F (36.5 C) 98.2 F (36.8 C) 97.9 F (36.6 C)  TempSrc: Oral Oral Oral Oral  SpO2: 99% 99% 100% 98%  Weight:      Height:       SpO2: 98 %   Intake/Output Summary (Last 24 hours) at 05/26/2023 1128 Last data filed at 05/26/2023 0700 Gross per 24 hour  Intake 366.02 ml  Output --  Net 366.02 ml   Filed Weights   05/22/23 2303 05/23/23 0339  Weight: 84.8 kg 85.8 kg    Exam: General exam: In no acute distress. Respiratory system: Good air movement and clear to auscultation. Cardiovascular system: S1 & S2 heard, RRR. No JVD. Gastrointestinal system: Abdomen is nondistended, soft and nontender.  Extremities: No pedal edema. Skin: No rashes, lesions or ulcers Psychiatry: Judgement and insight appear normal. Mood & affect appropriate.    Data Reviewed:    Labs: Basic Metabolic Panel: Recent Labs  Lab 05/22/23 1426 05/23/23 0511 05/24/23 0413 05/25/23 0421 05/26/23 0421  NA 135 135 136 136 134*  K 3.6 3.5 3.3* 3.6 3.3*  CL 105 104 106 107 101  CO2 18* 19* 22 22 23   GLUCOSE 89 89 84 93 98  BUN <5* 5* 5* <5* <5*  CREATININE 0.84 0.80 0.81 0.81 0.89  CALCIUM 9.5 9.2 8.7* 9.1 9.5  MG  --  2.2 2.1 2.1 1.9  PHOS  --  2.9 2.8 2.6 4.0   GFR Estimated Creatinine Clearance: 81.2 mL/min (by C-G formula based on SCr of 0.89 mg/dL). Liver Function Tests: Recent Labs  Lab 05/20/23 1409 05/21/23 1944 05/22/23 1426 05/23/23 0511 05/24/23 0413 05/25/23 0421 05/26/23 0421  AST 14* 17 14* 13*  --   --   --   ALT 16 18 16 14   --   --   --   ALKPHOS 47 46 43 42  --   --   --   BILITOT 0.7 0.7 0.8 0.8  --   --   --   PROT 8.5* 8.6* 8.4* 7.6  --   --   --   ALBUMIN 4.6 4.5 4.3 3.9 3.3* 3.4* 3.6   Recent Labs  Lab 05/20/23 1409 05/21/23 1944 05/22/23 1426  LIPASE 22 27 29    No results for input(s): "AMMONIA" in the last 168 hours. Coagulation profile No results for input(s): "INR", "PROTIME" in the last 168  hours. COVID-19 Labs  No results for input(s): "DDIMER", "FERRITIN", "LDH", "CRP" in the last 72 hours.  Lab Results  Component Value Date   SARSCOV2NAA NEGATIVE 05/08/2023   SARSCOV2NAA NEGATIVE 09/12/2022   SARSCOV2NAA NEGATIVE 08/21/2021   SARSCOV2NAA POSITIVE (A) 09/19/2020    CBC: Recent Labs  Lab 05/22/23 1426 05/23/23 0511 05/24/23 0413 05/25/23 0421 05/26/23 0421  WBC 6.8 6.2 7.0 5.8 4.9  HGB 12.1 11.4* 10.3* 10.7* 11.1*  HCT 37.9 35.1* 32.0* 33.3* 34.4*  MCV 89.6 90.7 90.7 90.0 89.1  PLT 230 218 181 196 212   Cardiac Enzymes: Recent Labs  Lab 05/23/23 0511  CKTOTAL 73   BNP (last 3 results) Recent Labs    05/18/23 0822  PROBNP <36   CBG: No results for input(s): "GLUCAP" in the last 168 hours.  D-Dimer: No results for input(s): "DDIMER" in the last 72 hours. Hgb A1c: No results for input(s): "HGBA1C" in the last 72 hours. Lipid Profile: No results for input(s): "CHOL", "HDL", "LDLCALC", "TRIG", "CHOLHDL", "LDLDIRECT" in the last 72 hours. Thyroid function studies: No results for input(s): "TSH", "T4TOTAL", "T3FREE", "THYROIDAB" in the last 72 hours.  Invalid input(s): "FREET3" Anemia work up: No results for input(s): "VITAMINB12", "FOLATE", "FERRITIN", "TIBC", "IRON", "RETICCTPCT" in the last 72 hours. Sepsis Labs: Recent Labs  Lab 05/23/23 0158 05/23/23 0511 05/24/23 0413 05/25/23 0421 05/26/23 0421  PROCALCITON  --  <0.10  --   --   --   WBC  --  6.2 7.0 5.8 4.9  LATICACIDVEN 0.7  --   --   --   --    Microbiology Recent Results (from the past 240 hour(s))  Gastrointestinal Panel by PCR , Stool     Status: None   Collection Time: 05/20/23  4:04 PM   Specimen: Stool  Result Value Ref Range Status   Campylobacter species NOT DETECTED NOT DETECTED Final   Plesimonas shigelloides NOT DETECTED NOT DETECTED Final   Salmonella species NOT DETECTED NOT DETECTED Final   Yersinia enterocolitica NOT DETECTED NOT DETECTED Final   Vibrio species  NOT DETECTED NOT DETECTED Final   Vibrio cholerae NOT DETECTED NOT DETECTED Final   Enteroaggregative E coli (EAEC) NOT DETECTED NOT DETECTED Final   Enteropathogenic E coli (EPEC) NOT DETECTED NOT DETECTED Final   Enterotoxigenic E coli (ETEC) NOT DETECTED NOT DETECTED Final   Shiga like toxin producing E coli (STEC) NOT DETECTED NOT DETECTED Final   Shigella/Enteroinvasive E coli (EIEC) NOT DETECTED NOT DETECTED Final   Cryptosporidium NOT DETECTED NOT DETECTED Final   Cyclospora cayetanensis NOT DETECTED NOT DETECTED Final   Entamoeba histolytica NOT DETECTED NOT DETECTED Final   Giardia lamblia NOT DETECTED NOT DETECTED Final   Adenovirus F40/41 NOT DETECTED NOT DETECTED Final   Astrovirus NOT DETECTED NOT DETECTED Final   Norovirus GI/GII NOT DETECTED NOT DETECTED Final   Rotavirus A NOT DETECTED NOT DETECTED Final   Sapovirus (I, II, IV, and V) NOT DETECTED NOT DETECTED Final    Comment: Performed at [facility], [address] [place], [place] [address]  C Difficile Quick Screen w PCR reflex     Status: None   Collection Time: 05/20/23  4:04 PM   Specimen: Stool  Result Value Ref Range Status   C Diff antigen NEGATIVE NEGATIVE Final   C Diff toxin NEGATIVE NEGATIVE Final   C Diff interpretation No C. difficile detected.  Final    Comment: Performed at [facility], [address], [place], [place] [address]     Medications:    amLODipine  10 mg Oral QHS   dicyclomine  10 mg Oral TID AC & HS   famotidine  20 mg Oral BID   feeding supplement  1 Container Oral TID BM   metoCLOPramide  10 mg Oral TID AC & HS   pantoprazole (PROTONIX) IV  40 mg Intravenous Q12H   spironolactone  50 mg Oral QHS   Continuous Infusions:  sodium chloride 20 mL/hr at 05/26/23 0540   promethazine (PHENERGAN) injection (IM or IVPB)        LOS: 3 days   Marinda Elk  [facility]  05/26/2023, 11:28 AM

## 2023-05-27 DIAGNOSIS — N182 Chronic kidney disease, stage 2 (mild): Secondary | ICD-10-CM | POA: Diagnosis not present

## 2023-05-27 DIAGNOSIS — R112 Nausea with vomiting, unspecified: Secondary | ICD-10-CM | POA: Diagnosis not present

## 2023-05-27 DIAGNOSIS — K2950 Unspecified chronic gastritis without bleeding: Secondary | ICD-10-CM | POA: Diagnosis not present

## 2023-05-27 DIAGNOSIS — K2990 Gastroduodenitis, unspecified, without bleeding: Secondary | ICD-10-CM | POA: Diagnosis not present

## 2023-05-27 DIAGNOSIS — K2970 Gastritis, unspecified, without bleeding: Secondary | ICD-10-CM | POA: Diagnosis not present

## 2023-05-27 LAB — SURGICAL PATHOLOGY

## 2023-05-27 LAB — BASIC METABOLIC PANEL
Anion gap: 7 (ref 5–15)
BUN: <5 mg/dL — ABNORMAL LOW (ref 6–20)
CO2: 24 mmol/L (ref 22–32)
Calcium: 9 mg/dL (ref 8.9–10.3)
Chloride: 102 mmol/L (ref 98–111)
Creatinine, Ser: 0.83 mg/dL (ref 0.44–1.00)
GFR, Estimated: >60 mL/min (ref >60)
Glucose, Bld: 97 mg/dL (ref 70–99)
Potassium: 3.6 mmol/L (ref 3.5–5.1)
Sodium: 133 mmol/L — ABNORMAL LOW (ref 135–145)

## 2023-05-27 LAB — T4: T4, Total: 5 ug/dL (ref 4.5–12.0)

## 2023-05-27 LAB — T3: T3, Total: 81 ng/dL (ref 71–180)

## 2023-05-27 MED ORDER — METOCLOPRAMIDE HCL 5 MG PO TABS
5.0000 mg | ORAL_TABLET | Freq: Three times a day (TID) | ORAL | Status: DC
  Administered 2023-05-27 – 2023-05-28 (×5): 5 mg via ORAL
  Filled 2023-05-27 (×5): qty 1

## 2023-05-27 MED ORDER — PANTOPRAZOLE SODIUM 40 MG PO TBEC
40.0000 mg | DELAYED_RELEASE_TABLET | Freq: Two times a day (BID) | ORAL | Status: DC
  Administered 2023-05-27 – 2023-05-28 (×3): 40 mg via ORAL
  Filled 2023-05-27 (×3): qty 1

## 2023-05-27 MED ORDER — SUCRALFATE 1 G PO TABS
1.0000 g | ORAL_TABLET | Freq: Two times a day (BID) | ORAL | Status: DC
  Administered 2023-05-27 – 2023-05-28 (×3): 1 g via ORAL
  Filled 2023-05-27 (×3): qty 1

## 2023-05-27 NOTE — Plan of Care (Signed)
  Problem: Clinical Measurements: Goal: Will remain free from infection Outcome: Progressing   Problem: Activity: Goal: Risk for activity intolerance will decrease Outcome: Progressing   Problem: Nutrition: Goal: Adequate nutrition will be maintained Outcome: Progressing   Problem: Coping: Goal: Level of anxiety will decrease Outcome: Progressing   Problem: Pain Managment: Goal: General experience of comfort will improve Outcome: Progressing   Problem: Safety: Goal: Ability to remain free from injury will improve Outcome: Progressing   Problem: Skin Integrity: Goal: Risk for impaired skin integrity will decrease Outcome: Progressing   

## 2023-05-27 NOTE — Progress Notes (Signed)
[facility] PROGRESS NOTE    Progress Note  Tamara Church  GLO:[id number] DOB: September 03, 1976 DOA: 05/22/2023 PCP: Glendale Chard, DO     Brief Narrative:   Tamara Church is an 47 y.o. female past medical history of chronic nausea and vomiting, essential hypertension and esophagitis cholecystectomy comes in with intractable nausea and vomiting for 3 weeks, pregnancy test is negative CT scan of the abdomen pelvis showed no acute findings, TSH was 7.3 she reports compliant with her Reglan and Aciphex.  GI was consulted recommended endoscopy on 05/26/2023. EGD in 2023 showed erythematous unchanged mucosa, pathology showed reactive gastropathy.  Assessment/Plan:   Intractable nausea and vomiting with postprandial abdominal pain: GI was consulted EGD performed 05/26/2023 showed no gross lesion, 2 cm hiatal hernia erythematous mucosa biopsy taken, duodenum was unremarkable. IV PPI twice a day. Continue Reglan twice a day. Avoid narcotics. Discontinue C. difficile test she has had no further bowel movement. HIV is nonreactive. Further management per GI hopefully we can advance her diet today she has been tolerating it no nausea or vomiting. Question should we continue Reglan as an outpatient.  Essential hypertension: Resume amlodipine, blood pressure is well-controlled.  Normocytic anemia: Hemoglobin is stable.  Elevated TSH: Normal free T4 likely, question sick euthyroid syndrome. Need to be repeated in 4 to 6 weeks.  History of GERD/esophagitis: Continue PPI twice a day.  Hypokalemia: Repleted now improved.   DVT prophylaxis: scd's Family Communication:son Status is: Inpatient Remains inpatient appropriate because: Intractable nausea and vomiting.    Code Status:     Code Status Orders  (From admission, onward)           Start     Ordered   05/23/23 0058  Full code  Continuous       Question:  By:  Answer:  Consent: discussion documented in EHR   05/23/23  0059           Code Status History     Date Active Date Inactive Code Status Order ID Comments User Context   04/03/2019 1646 04/05/2019 1719 Full Code [id number]  Jonah Blue, MD ED         IV Access:   Peripheral IV   Procedures and diagnostic studies:   CT HEAD WO CONTRAST ( )  Result Date: 05/26/2023 CLINICAL DATA:  Initial evaluation for chronic recurrent nausea and vomiting. EXAM: CT HEAD WITHOUT CONTRAST TECHNIQUE: Contiguous axial images were obtained from the base of the skull through the vertex without intravenous contrast. RADIATION DOSE REDUCTION: This exam was performed according to the departmental dose-optimization program which includes automated exposure control, adjustment of the mA and/or kV according to patient size and/or use of iterative reconstruction technique. COMPARISON:  Prior study from 04/15/2017. FINDINGS: Brain: Cerebral volume within normal limits. No acute intracranial hemorrhage. No acute large vessel territory infarct. No mass lesion, midline shift or mass effect. No hydrocephalus or extra-axial fluid collection. Empty sella noted. Vascular: No abnormal hyperdense vessel. Skull: Scalp soft tissues within normal limits.  Calvarium intact. Sinuses/Orbits: Small remote posttraumatic defect noted at the right lamina papyracea. Globes orbital soft tissues otherwise unremarkable. Scattered mucosal thickening noted about the right ethmoidal air cells. Visualized paranasal sinuses are otherwise clear. No mastoid effusion. Other: None. IMPRESSION: 1. No acute intracranial abnormality. 2. Empty sella, a nonspecific finding, but can be seen in the setting of idiopathic intracranial hypertension. 3. Otherwise normal head CT. Electronically Signed   By: Rise Mu M.D.   On: 05/26/2023 04:58  Medical Consultants:   None.   Subjective:    Tamara Church tolerating her diet this morning.  Objective:    Vitals:   05/26/23 1410 05/26/23  1439 05/26/23 2005 05/27/23 0557  BP: 124/79 126/87 118/80 113/78  Pulse: 85 79 89 85  Resp: (!) 22  18 18   Temp:  98.1 F (36.7 C) 98.2 F (36.8 C) 98.1 F (36.7 C)  TempSrc:  Axillary Oral Oral  SpO2: 96% 99% 96% 100%  Weight:      Height:       SpO2: 100 % O2 Flow Rate (L/min): 8 L/min   Intake/Output Summary (Last 24 hours) at 05/27/2023 1001 Last data filed at 05/27/2023 0546 Gross per 24 hour  Intake 1772.3 ml  Output --  Net 1772.3 ml   Filed Weights   05/22/23 2303 05/23/23 0339 05/26/23 1240  Weight: 84.8 kg 85.8 kg 85.8 kg    Exam: General exam: In no acute distress. Respiratory system: Good air movement and clear to auscultation. Cardiovascular system: S1 & S2 heard, RRR. No JVD. Gastrointestinal system: Abdomen is nondistended, soft and nontender.  Extremities: No pedal edema. Skin: No rashes, lesions or ulcers Psychiatry: Judgement and insight appear normal. Mood & affect appropriate.  Data Reviewed:    Labs: Basic Metabolic Panel: Recent Labs  Lab 05/23/23 0511 05/24/23 0413 05/25/23 0421 05/26/23 0421 05/27/23 0411  NA 135 136 136 134* 133*  K 3.5 3.3* 3.6 3.3* 3.6  CL 104 106 107 101 102  CO2 19* 22 22 23 24   GLUCOSE 89 84 93 98 97  BUN 5* 5* <5* <5* <5*  CREATININE 0.80 0.81 0.81 0.89 0.83  CALCIUM 9.2 8.7* 9.1 9.5 9.0  MG 2.2 2.1 2.1 1.9  --   PHOS 2.9 2.8 2.6 4.0  --    GFR Estimated Creatinine Clearance: 87 mL/min (by C-G formula based on SCr of 0.83 mg/dL). Liver Function Tests: Recent Labs  Lab 05/20/23 1409 05/21/23 1944 05/22/23 1426 05/23/23 0511 05/24/23 0413 05/25/23 0421 05/26/23 0421  AST 14* 17 14* 13*  --   --   --   ALT 16 18 16 14   --   --   --   ALKPHOS 47 46 43 42  --   --   --   BILITOT 0.7 0.7 0.8 0.8  --   --   --   PROT 8.5* 8.6* 8.4* 7.6  --   --   --   ALBUMIN 4.6 4.5 4.3 3.9 3.3* 3.4* 3.6   Recent Labs  Lab 05/20/23 1409 05/21/23 1944 05/22/23 1426  LIPASE 22 27 29    No results for input(s):  "AMMONIA" in the last 168 hours. Coagulation profile No results for input(s): "INR", "PROTIME" in the last 168 hours. COVID-19 Labs  No results for input(s): "DDIMER", "FERRITIN", "LDH", "CRP" in the last 72 hours.  Lab Results  Component Value Date   SARSCOV2NAA NEGATIVE 05/08/2023   SARSCOV2NAA NEGATIVE 09/12/2022   SARSCOV2NAA NEGATIVE 08/21/2021   SARSCOV2NAA POSITIVE (A) 09/19/2020    CBC: Recent Labs  Lab 05/22/23 1426 05/23/23 0511 05/24/23 0413 05/25/23 0421 05/26/23 0421  WBC 6.8 6.2 7.0 5.8 4.9  HGB 12.1 11.4* 10.3* 10.7* 11.1*  HCT 37.9 35.1* 32.0* 33.3* 34.4*  MCV 89.6 90.7 90.7 90.0 89.1  PLT 230 218 181 196 212   Cardiac Enzymes: Recent Labs  Lab 05/23/23 0511  CKTOTAL 73   BNP (last 3 results) Recent Labs    05/18/23 1610  PROBNP <36   CBG: No results for input(s): "GLUCAP" in the last 168 hours. D-Dimer: No results for input(s): "DDIMER" in the last 72 hours. Hgb A1c: No results for input(s): "HGBA1C" in the last 72 hours. Lipid Profile: No results for input(s): "CHOL", "HDL", "LDLCALC", "TRIG", "CHOLHDL", "LDLDIRECT" in the last 72 hours. Thyroid function studies: Recent Labs    05/26/23 0421  T4TOTAL 5.0   Anemia work up: No results for input(s): "VITAMINB12", "FOLATE", "FERRITIN", "TIBC", "IRON", "RETICCTPCT" in the last 72 hours. Sepsis Labs: Recent Labs  Lab 05/23/23 0158 05/23/23 0511 05/24/23 0413 05/25/23 0421 05/26/23 0421  PROCALCITON  --  <0.10  --   --   --   WBC  --  6.2 7.0 5.8 4.9  LATICACIDVEN 0.7  --   --   --   --    Microbiology Recent Results (from the past 240 hour(s))  Gastrointestinal Panel by PCR , Stool     Status: None   Collection Time: 05/20/23  4:04 PM   Specimen: Stool  Result Value Ref Range Status   Campylobacter species NOT DETECTED NOT DETECTED Final   Plesimonas shigelloides NOT DETECTED NOT DETECTED Final   Salmonella species NOT DETECTED NOT DETECTED Final   Yersinia enterocolitica NOT  DETECTED NOT DETECTED Final   Vibrio species NOT DETECTED NOT DETECTED Final   Vibrio cholerae NOT DETECTED NOT DETECTED Final   Enteroaggregative E coli (EAEC) NOT DETECTED NOT DETECTED Final   Enteropathogenic E coli (EPEC) NOT DETECTED NOT DETECTED Final   Enterotoxigenic E coli (ETEC) NOT DETECTED NOT DETECTED Final   Shiga like toxin producing E coli (STEC) NOT DETECTED NOT DETECTED Final   Shigella/Enteroinvasive E coli (EIEC) NOT DETECTED NOT DETECTED Final   Cryptosporidium NOT DETECTED NOT DETECTED Final   Cyclospora cayetanensis NOT DETECTED NOT DETECTED Final   Entamoeba histolytica NOT DETECTED NOT DETECTED Final   Giardia lamblia NOT DETECTED NOT DETECTED Final   Adenovirus F40/41 NOT DETECTED NOT DETECTED Final   Astrovirus NOT DETECTED NOT DETECTED Final   Norovirus GI/GII NOT DETECTED NOT DETECTED Final   Rotavirus A NOT DETECTED NOT DETECTED Final   Sapovirus (I, II, IV, and V) NOT DETECTED NOT DETECTED Final    Comment: Performed at [facility], [address] [place], [place] [address]  C Difficile Quick Screen w PCR reflex     Status: None   Collection Time: 05/20/23  4:04 PM   Specimen: Stool  Result Value Ref Range Status   C Diff antigen NEGATIVE NEGATIVE Final   C Diff toxin NEGATIVE NEGATIVE Final   C Diff interpretation No C. difficile detected.  Final    Comment: Performed at [facility], [address], [place], [place] [address]     Medications:    amLODipine  10 mg Oral QHS   dicyclomine  10 mg Oral TID AC & HS   famotidine  20 mg Oral BID   feeding supplement  1 Container Oral TID BM   metoCLOPramide  10 mg Oral TID AC & HS   pantoprazole  40 mg Oral BID   spironolactone  50 mg Oral QHS   Continuous Infusions:  0.9 % NaCl with KCl 40 mEq / L 75 mL/hr at 05/27/23 0545   promethazine (PHENERGAN) injection (IM or IVPB)        LOS: 4 days   Marinda Elk  [facility]  05/27/2023, 10:01 AM

## 2023-05-27 NOTE — Plan of Care (Signed)
?  Problem: Health Behavior/Discharge Planning: ?Goal: Ability to manage health-related needs will improve ?Outcome: Progressing ?  ?Problem: Clinical Measurements: ?Goal: Ability to maintain clinical measurements within normal limits will improve ?Outcome: Progressing ?Goal: Will remain free from infection ?Outcome: Progressing ?Goal: Diagnostic test results will improve ?Outcome: Progressing ?  ?Problem: Activity: ?Goal: Risk for activity intolerance will decrease ?Outcome: Progressing ?  ?

## 2023-05-27 NOTE — Anesthesia Postprocedure Evaluation (Signed)
Anesthesia Post Note  Patient: Tamara Church  Procedure(s) Performed: ESOPHAGOGASTRODUODENOSCOPY (EGD) WITH PROPOFOL BIOPSY     Patient location during evaluation: PACU Anesthesia Type: MAC Level of consciousness: awake and alert Pain management: pain level controlled Vital Signs Assessment: post-procedure vital signs reviewed and stable Respiratory status: spontaneous breathing, nonlabored ventilation, respiratory function stable and patient connected to nasal cannula oxygen Cardiovascular status: stable and blood pressure returned to baseline Postop Assessment: no apparent nausea or vomiting Anesthetic complications: no   No notable events documented.  Last Vitals:  Vitals:   05/26/23 2005 05/27/23 0557  BP: 118/80 113/78  Pulse: 89 85  Resp: 18 18  Temp: 36.8 C 36.7 C  SpO2: 96% 100%    Last Pain:  Vitals:   05/27/23 0845  TempSrc:   PainSc: 0-No pain                 [name] S

## 2023-05-27 NOTE — Progress Notes (Signed)
Progress Note  Primary GI: Dr. Meridee Score DOA: 05/22/2023         Hospital Day: 6   Subjective  Chief Complaint: Intractable nausea and vomiting   No family was present at the time of my evaluation.   But her husband was on the phone. Overall she is having decreasing pain and nausea. Patient sitting up in bed, states she is feeling improved was able to eat some potato soup yesterday and is interested in in advancing her diet.   States she has not had diarrhea since Monday. She did have a bowel movement this morning and last night that were not documented but these were formed stools and were actually small balls to formed. Denies fever or chills. Of note patient was on Carafate in the past and states this did help she was taken off of it 7/30 states it was helping her pain at the time. Per flowsheet last recorded bowel movement was 9/2 and diarrhea.    Objective   Vital signs in last 24 hours: Temp:  [97.4 F (36.3 C)-98.2 F (36.8 C)] 98.1 F (36.7 C) (09/05 0557) Pulse Rate:  [79-112] 85 (09/05 0557) Resp:  [15-32] 18 (09/05 0557) BP: (111-131)/(74-87) 113/78 (09/05 0557) SpO2:  [95 %-100 %] 100 % (09/05 0557) Weight:  [85.8 kg] 85.8 kg (09/04 1240) Last BM Date : 05/24/23 Last BM recorded by nurses in past 5 days Stool Type: Type 6 (Mushy consistency with ragged edges) (description provided by pt, she had flushed toilet) (05/24/2023 10:50 PM)  General:   female in no acute distress  Heart:  Regular rate and rhythm; no murmurs Pulm: Clear anteriorly; no wheezing Abdomen:  Soft, Obese AB, Sluggish bowel sounds. No tenderness . Without guarding and Without rebound, No organomegaly appreciated. Extremities:  without  edema. Neurologic:  Alert and  oriented x4;  No focal deficits.  Psych:  Cooperative. Normal mood and affect.  Intake/Output from previous day: 09/04 0701 - 09/05 0700 In: 1772.3 [P.O.:360; I.V.:1412.3] Out: -  Intake/Output this shift: No intake/output  data recorded.  Studies/Results: CT HEAD WO CONTRAST ( )  Result Date: 05/26/2023 CLINICAL DATA:  Initial evaluation for chronic recurrent nausea and vomiting. EXAM: CT HEAD WITHOUT CONTRAST TECHNIQUE: Contiguous axial images were obtained from the base of the skull through the vertex without intravenous contrast. RADIATION DOSE REDUCTION: This exam was performed according to the departmental dose-optimization program which includes automated exposure control, adjustment of the mA and/or kV according to patient size and/or use of iterative reconstruction technique. COMPARISON:  Prior study from 04/15/2017. FINDINGS: Brain: Cerebral volume within normal limits. No acute intracranial hemorrhage. No acute large vessel territory infarct. No mass lesion, midline shift or mass effect. No hydrocephalus or extra-axial fluid collection. Empty sella noted. Vascular: No abnormal hyperdense vessel. Skull: Scalp soft tissues within normal limits.  Calvarium intact. Sinuses/Orbits: Small remote posttraumatic defect noted at the right lamina papyracea. Globes orbital soft tissues otherwise unremarkable. Scattered mucosal thickening noted about the right ethmoidal air cells. Visualized paranasal sinuses are otherwise clear. No mastoid effusion. Other: None. IMPRESSION: 1. No acute intracranial abnormality. 2. Empty sella, a nonspecific finding, but can be seen in the setting of idiopathic intracranial hypertension. 3. Otherwise normal head CT. Electronically Signed   By: Rise Mu M.D.   On: 05/26/2023 04:58    Lab Results: Recent Labs    05/25/23 0421 05/26/23 0421  WBC 5.8 4.9  HGB 10.7* 11.1*  HCT 33.3* 34.4*  PLT 196 212  BMET Recent Labs    05/25/23 0421 05/26/23 0421 05/27/23 0411  NA 136 134* 133*  K 3.6 3.3* 3.6  CL 107 101 102  CO2 22 23 24   GLUCOSE 93 98 97  BUN <5* <5* <5*  CREATININE 0.81 0.89 0.83  CALCIUM 9.1 9.5 9.0   LFT Recent Labs    05/26/23 0421  ALBUMIN 3.6    PT/INR No results for input(s): "LABPROT", "INR" in the last 72 hours.   Scheduled Meds:  amLODipine  10 mg Oral QHS   dicyclomine  10 mg Oral TID AC & HS   famotidine  20 mg Oral BID   feeding supplement  1 Container Oral TID BM   metoCLOPramide  10 mg Oral TID AC & HS   pantoprazole  40 mg Oral BID   spironolactone  50 mg Oral QHS   Continuous Infusions:  0.9 % NaCl with KCl 40 mEq / L 75 mL/hr at 05/27/23 0545   promethazine (PHENERGAN) injection (IM or IVPB)        Patient profile:   47 year old female with longstanding history of intractable nausea and vomiting with extensive workup including EGD 08/2022 showing H. pylori negative gastritis, scheduled for esophageal manometry December 2024.  GES negative.  S/p cholecystectomy   Impression/Plan:   Intractable nausea and vomiting with abdominal pain and diarrhea: -CT abdomen pelvis with contrast shows no acute findings.  Normal pancreas, normal liver, normal stomach, small bowel, and colon. - TSH 7.278 -Cortisol 8.5, normal - CT head WO empty sella nonspecific finding may be seen in IIH otherwise normal head CT -9/4 EGD 2 cm HH, gastritis, esophagus biopsied but unremarkable, unremarkable duodenum, pending pathology  -Patient is on dicyclomine scheduled received dose this morning scheduled 4 times daily. Pepcid 20 mg twice daily Protonix 40 mg p.o. BID started today, previously on IV. - Reglan 10 mg 4 times daily, only received 3 times yesterday and 1 dose this morning. -Has received Imodium twice yesterday -On as needed Zofran 4 mg received twice yesterday -as needed Phenergan 12.5 mg twice IV yesterday -Minimize narcotics - can cancel stool studies, has resolved  -Still pending pathology from EGD however I suspect this will be unremarkable.  Patient's had extensive workup including now unremarkable cortisol, CT head, negative GES study. - appears to be improving goal today will be cutting back on antiemetics.  -  will advance diet to soft diet - continue protonix 40 mg BID PO, pepcid 20 mg BID PO - will cut reglan back to 5 mg QID, and can try after that more as needed or address outpatient.  - Seems to have responded better to phenergan will continue this PO as needed and zofran 4 mg IV  - suspicious for bile acid gastritis and bile acid diarrhea,  will add on Carafate as patient was on this before and did well with it, can consider trial of cholestyramine, but some concern for constipation.  - continue dicyclomine QID for now   Principal Problem:   Intractable nausea and vomiting Active Problems:   HYPERTENSION, BENIGN SYSTEMIC   CKD (chronic kidney disease) stage 2, GFR 60-89 ml/min    LOS: 4 days   Tamara Church  05/27/2023, 8:24 AM

## 2023-05-28 DIAGNOSIS — N182 Chronic kidney disease, stage 2 (mild): Secondary | ICD-10-CM | POA: Diagnosis not present

## 2023-05-28 DIAGNOSIS — K2950 Unspecified chronic gastritis without bleeding: Secondary | ICD-10-CM | POA: Diagnosis not present

## 2023-05-28 DIAGNOSIS — K2970 Gastritis, unspecified, without bleeding: Secondary | ICD-10-CM

## 2023-05-28 DIAGNOSIS — R112 Nausea with vomiting, unspecified: Secondary | ICD-10-CM | POA: Diagnosis not present

## 2023-05-28 DIAGNOSIS — K2990 Gastroduodenitis, unspecified, without bleeding: Secondary | ICD-10-CM | POA: Diagnosis not present

## 2023-05-28 LAB — COMPREHENSIVE METABOLIC PANEL
ALT: 26 U/L (ref 0–44)
AST: 14 U/L — ABNORMAL LOW (ref 15–41)
Albumin: 3.7 g/dL (ref 3.5–5.0)
Alkaline Phosphatase: 43 U/L (ref 38–126)
Anion gap: 9 (ref 5–15)
BUN: 6 mg/dL (ref 6–20)
CO2: 27 mmol/L (ref 22–32)
Calcium: 9.5 mg/dL (ref 8.9–10.3)
Chloride: 101 mmol/L (ref 98–111)
Creatinine, Ser: 1.04 mg/dL — ABNORMAL HIGH (ref 0.44–1.00)
GFR, Estimated: >60 mL/min (ref >60)
Glucose, Bld: 146 mg/dL — ABNORMAL HIGH (ref 70–99)
Potassium: 3.6 mmol/L (ref 3.5–5.1)
Sodium: 137 mmol/L (ref 135–145)
Total Bilirubin: 0.6 mg/dL (ref 0.3–1.2)
Total Protein: 7.4 g/dL (ref 6.5–8.1)

## 2023-05-28 LAB — CBC
HCT: 35.4 % — ABNORMAL LOW (ref 36.0–46.0)
Hemoglobin: 11.5 g/dL — ABNORMAL LOW (ref 12.0–15.0)
MCH: 29.4 pg (ref 26.0–34.0)
MCHC: 32.5 g/dL (ref 30.0–36.0)
MCV: 90.5 fL (ref 80.0–100.0)
Platelets: 213 10*3/uL (ref 150–400)
RBC: 3.91 MIL/uL (ref 3.87–5.11)
RDW: 13.6 % (ref 11.5–15.5)
WBC: 5.2 10*3/uL (ref 4.0–10.5)
nRBC: 0 % (ref 0.0–0.2)

## 2023-05-28 LAB — CALCIUM, IONIZED: Calcium, Ionized, Serum: 5.4 mg/dL (ref 4.5–5.6)

## 2023-05-28 MED ORDER — SUCRALFATE 1 G PO TABS: 1.0000 g | ORAL_TABLET | Freq: Two times a day (BID) | ORAL | 3 refills | Status: DC

## 2023-05-28 MED ORDER — PANTOPRAZOLE SODIUM 40 MG PO TBEC: 40.0000 mg | DELAYED_RELEASE_TABLET | Freq: Two times a day (BID) | ORAL | 3 refills | Status: DC

## 2023-05-28 MED ORDER — METOCLOPRAMIDE HCL 5 MG PO TABS: 5.0000 mg | ORAL_TABLET | Freq: Three times a day (TID) | ORAL | 0 refills | Status: DC

## 2023-05-28 NOTE — Discharge Summary (Signed)
Physician Discharge Summary  Tamara Church ZOX:[id number] DOB: 03-Oct-1975 DOA: 05/22/2023  PCP: Glendale Chard, DO  Admit date: 05/22/2023 Discharge date: 05/28/2023  Admitted From: Home Disposition:  Home  Recommendations for Outpatient Follow-up:  Follow up with PCP in 1-2 weeks for TSH and free T4. Follow-up with GI in 2 weeks Please obtain BMP/CBC in one week   Home Health:No Equipment/Devices:None  Discharge Condition:Stable CODE STATUS:Full Diet recommendation: Heart Healthy  Brief/Interim Summary: 47 y.o. female past medical history of chronic nausea and vomiting, essential hypertension and esophagitis cholecystectomy comes in with intractable nausea and vomiting for 3 weeks, pregnancy test is negative CT scan of the abdomen pelvis showed no acute findings, TSH was 7.3 she reports compliant with her Reglan and Aciphex.  GI was consulted recommended endoscopy on 05/26/2023. EGD in 2023 showed erythematous unchanged mucosa, pathology showed reactive gastropathy.    Discharge Diagnoses:  Principal Problem:   Intractable nausea and vomiting Active Problems:   HYPERTENSION, BENIGN SYSTEMIC   CKD (chronic kidney disease) stage 2, GFR 60-89 ml/min   Gastritis and gastroduodenitis  Intractable nausea and vomiting with postprandial abdominal pain: She was started on IV Protonix and Reglan. GI was consulted to perform an EGD on 05/26/2023 which showed no gross lesions hiatal hernia erythematous mucosa biopsies were taken. She was transition to oral Protonix twice a day, Carafate and Reglan 3 times a day. HIV was nonreactive. GI recommended to continue Protonix and Reglan as an outpatient to follow-up with her as an outpatient.  Essential hypertension: No changes made to her medication continue current regimen.  Normocytic anemia: Hemoglobin is stable.  Elevated TSH: Normal free T4 question sick euthyroid syndrome. TSH and free T4 need to be repeated in 6  weeks.  Hypokalemia: Repleted now resolved.  Discharge Instructions  Discharge Instructions     Diet - low sodium heart healthy   Complete by: As directed    Increase activity slowly   Complete by: As directed       Allergies as of 05/28/2023       Reactions   Ace Inhibitors Cough   Angiotensin Receptor Blockers    cough   Hctz [hydrochlorothiazide]    hypokalemia        Medication List     STOP taking these medications    chlorhexidine 0.12 % solution Commonly known as: PERIDEX   famotidine 20 MG tablet Commonly known as: PEPCID   [name] (alum & mag hydroxide, lidocaine, dicyclomine) oral mixture   phenazopyridine 200 MG tablet Commonly known as: PYRIDIUM   prochlorperazine 10 MG tablet Commonly known as: COMPAZINE       TAKE these medications    amLODipine 10 MG tablet Commonly known as: NORVASC Take 1 tablet (10 mg total) by mouth daily.   dicyclomine 20 MG tablet Commonly known as: BENTYL Take 1 tablet daily as needed for chest or abdominal pain What changed: Another medication with the same name was removed. Continue taking this medication, and follow the directions you see here.   levonorgestrel 20 MCG/24HR IUD Commonly known as: MIRENA 1 each by Intrauterine route once.   metoCLOPramide 5 MG tablet Commonly known as: REGLAN Take 1 tablet (5 mg total) by mouth 4 (four) times daily -  before meals and at bedtime. What changed: when to take this   pantoprazole 40 MG tablet Commonly known as: PROTONIX Take 1 tablet (40 mg total) by mouth 2 (two) times daily.   RABEprazole 20 MG tablet Commonly known as:  ACIPHEX Take 20 mg by mouth 2 (two) times daily.   spironolactone 25 MG tablet Commonly known as: ALDACTONE Take 2 tablets (50 mg total) by mouth at bedtime.   sucralfate 1 g tablet Commonly known as: CARAFATE Take 1 tablet (1 g total) by mouth 2 (two) times daily before lunch and supper.        Allergies  Allergen  Reactions   Ace Inhibitors Cough   Angiotensin Receptor Blockers     cough   Hctz [Hydrochlorothiazide]     hypokalemia    Consultations: Gastroenterology   Procedures/Studies: CT HEAD WO CONTRAST ( )  Result Date: 05/26/2023 CLINICAL DATA:  Initial evaluation for chronic recurrent nausea and vomiting. EXAM: CT HEAD WITHOUT CONTRAST TECHNIQUE: Contiguous axial images were obtained from the base of the skull through the vertex without intravenous contrast. RADIATION DOSE REDUCTION: This exam was performed according to the departmental dose-optimization program which includes automated exposure control, adjustment of the mA and/or kV according to patient size and/or use of iterative reconstruction technique. COMPARISON:  Prior study from 04/15/2017. FINDINGS: Brain: Cerebral volume within normal limits. No acute intracranial hemorrhage. No acute large vessel territory infarct. No mass lesion, midline shift or mass effect. No hydrocephalus or extra-axial fluid collection. Empty sella noted. Vascular: No abnormal hyperdense vessel. Skull: Scalp soft tissues within normal limits.  Calvarium intact. Sinuses/Orbits: Small remote posttraumatic defect noted at the right lamina papyracea. Globes orbital soft tissues otherwise unremarkable. Scattered mucosal thickening noted about the right ethmoidal air cells. Visualized paranasal sinuses are otherwise clear. No mastoid effusion. Other: None. IMPRESSION: 1. No acute intracranial abnormality. 2. Empty sella, a nonspecific finding, but can be seen in the setting of idiopathic intracranial hypertension. 3. Otherwise normal head CT. Electronically Signed   By: Rise Mu M.D.   On: 05/26/2023 04:58   CT ABDOMEN PELVIS W CONTRAST  Result Date: 05/21/2023 CLINICAL DATA:  Acute abdominal pain. Weight loss for 2 weeks. Nausea. EXAM: CT ABDOMEN AND PELVIS WITH CONTRAST TECHNIQUE: Multidetector CT imaging of the abdomen and pelvis was performed using the  standard protocol following bolus administration of intravenous contrast. RADIATION DOSE REDUCTION: This exam was performed according to the departmental dose-optimization program which includes automated exposure control, adjustment of the mA and/or kV according to patient size and/or use of iterative reconstruction technique. CONTRAST:  OMNIPAQUE IOHEXOL 300 MG/ML  SOLN COMPARISON:  None Available. FINDINGS: Lower chest: Lung bases are clear. Hepatobiliary: No focal hepatic lesion. Postcholecystectomy. No biliary dilatation. Pancreas: Pancreas is normal. No ductal dilatation. No pancreatic inflammation. Spleen: Normal spleen Adrenals/urinary tract: Adrenal glands normal. Multiple low-density lesions in the RIGHT kidney. Several larger lesions have simple fluid attenuation. There is motion artifact which limits evaluation of the small lesions. no change from comparison exam on 08/07/2022. LEFT kidney normal. Ureters and bladder normal. Stomach/Bowel: Stomach, small bowel, appendix, and cecum are normal. The colon and rectosigmoid colon are normal. Vascular/Lymphatic: Abdominal aorta is normal caliber. No periportal or retroperitoneal adenopathy. No pelvic adenopathy. Reproductive: IUD in expected location.  Ovaries normal Other: No free fluid. Musculoskeletal: No aggressive osseous lesion. IMPRESSION: 1. No acute findings in the abdomen pelvis. 2. Normal appendix. 3. Postcholecystectomy. 4. Multiple low-density lesions in the RIGHT kidney are favored to represent cysts. No change in sizefrom comparison CT 08/07/2022. No follow-up recommended for benign renal lesion. Electronically Signed   By: Genevive Bi M.D.   On: 05/21/2023 21:18   DG Chest Port 1 View  Result Date: 05/08/2023 CLINICAL DATA:  Chest pain EXAM: PORTABLE CHEST 1 VIEW COMPARISON:  Chest x-ray 05/06/2023 FINDINGS: The heart size and mediastinal contours are within normal limits. Both lungs are clear. The visualized skeletal structures  are unremarkable. IMPRESSION: No active disease. Electronically Signed   By: Darliss Cheney M.D.   On: 05/08/2023 20:36   DG Chest 2 View  Result Date: 05/06/2023 CLINICAL DATA:  Chest pain. EXAM: CHEST - 2 VIEW COMPARISON:  Chest radiograph dated 04/02/2023. FINDINGS: No focal consolidation, pleural effusion or pneumothorax. The cardiac silhouette is within normal limits. No acute osseous pathology. IMPRESSION: No active cardiopulmonary disease. Electronically Signed   By: Elgie Collard M.D.   On: 05/06/2023 21:05   (Echo, Carotid, EGD, Colonoscopy, ERCP)    Subjective: Rating her diet feels great.  Discharge Exam: Vitals:   05/27/23 2000 05/28/23 0609  BP: 107/68 97/65  Pulse: 68 74  Resp: 15 14  Temp: 97.9 F (36.6 C) 98.6 F (37 C)  SpO2: 99% 100%   Vitals:   05/27/23 0557 05/27/23 1348 05/27/23 2000 05/28/23 0609  BP: 113/78 118/79 107/68 97/65  Pulse: 85 93 68 74  Resp: 18 20 15 14   Temp: 98.1 F (36.7 C) 98.3 F (36.8 C) 97.9 F (36.6 C) 98.6 F (37 C)  TempSrc: Oral Oral Oral Oral  SpO2: 100% 100% 99% 100%  Weight:      Height:        General: Pt is alert, awake, not in acute distress Cardiovascular: RRR, S1/S2 +, no rubs, no gallops Respiratory: CTA bilaterally, no wheezing, no rhonchi Abdominal: Soft, NT, ND, bowel sounds + Extremities: no edema, no cyanosis    The results of significant diagnostics from this hospitalization (including imaging, microbiology, ancillary and laboratory) are listed below for reference.     Microbiology: Recent Results (from the past 240 hour(s))  Gastrointestinal Panel by PCR , Stool     Status: None   Collection Time: 05/20/23  4:04 PM   Specimen: Stool  Result Value Ref Range Status   Campylobacter species NOT DETECTED NOT DETECTED Final   Plesimonas shigelloides NOT DETECTED NOT DETECTED Final   Salmonella species NOT DETECTED NOT DETECTED Final   Yersinia enterocolitica NOT DETECTED NOT DETECTED Final   Vibrio  species NOT DETECTED NOT DETECTED Final   Vibrio cholerae NOT DETECTED NOT DETECTED Final   Enteroaggregative E coli (EAEC) NOT DETECTED NOT DETECTED Final   Enteropathogenic E coli (EPEC) NOT DETECTED NOT DETECTED Final   Enterotoxigenic E coli (ETEC) NOT DETECTED NOT DETECTED Final   Shiga like toxin producing E coli (STEC) NOT DETECTED NOT DETECTED Final   Shigella/Enteroinvasive E coli (EIEC) NOT DETECTED NOT DETECTED Final   Cryptosporidium NOT DETECTED NOT DETECTED Final   Cyclospora cayetanensis NOT DETECTED NOT DETECTED Final   Entamoeba histolytica NOT DETECTED NOT DETECTED Final   Giardia lamblia NOT DETECTED NOT DETECTED Final   Adenovirus F40/41 NOT DETECTED NOT DETECTED Final   Astrovirus NOT DETECTED NOT DETECTED Final   Norovirus GI/GII NOT DETECTED NOT DETECTED Final   Rotavirus A NOT DETECTED NOT DETECTED Final   Sapovirus (I, II, IV, and V) NOT DETECTED NOT DETECTED Final    Comment: Performed at [facility], [address] [place], [place] [address]  C Difficile Quick Screen w PCR reflex     Status: None   Collection Time: 05/20/23  4:04 PM   Specimen: Stool  Result Value Ref Range Status   C Diff antigen NEGATIVE NEGATIVE Final   C  Diff toxin NEGATIVE NEGATIVE Final   C Diff interpretation No C. difficile detected.  Final    Comment: Performed at [facility], [address], [place], [place] [address]     Labs: BNP (last 3 results) No results for input(s): "BNP" in the last 8760 hours. Basic Metabolic Panel: Recent Labs  Lab 05/23/23 0511 05/24/23 0413 05/25/23 0421 05/26/23 0421 05/27/23 0411  NA 135 136 136 134* 133*  K 3.5 3.3* 3.6 3.3* 3.6  CL 104 106 107 101 102  CO2 19* 22 22 23 24   GLUCOSE 89 84 93 98 97  BUN 5* 5* <5* <5* <5*  CREATININE 0.80 0.81 0.81 0.89 0.83  CALCIUM 9.2 8.7* 9.1 9.5 9.0  MG 2.2 2.1 2.1 1.9  --   PHOS 2.9 2.8 2.6 4.0  --    Liver Function Tests: Recent Labs  Lab 05/21/23 1944 05/22/23 1426  05/23/23 0511 05/24/23 0413 05/25/23 0421 05/26/23 0421  AST 17 14* 13*  --   --   --   ALT 18 16 14   --   --   --   ALKPHOS 46 43 42  --   --   --   BILITOT 0.7 0.8 0.8  --   --   --   PROT 8.6* 8.4* 7.6  --   --   --   ALBUMIN 4.5 4.3 3.9 3.3* 3.4* 3.6   Recent Labs  Lab 05/21/23 1944 05/22/23 1426  LIPASE 27 29   No results for input(s): "AMMONIA" in the last 168 hours. CBC: Recent Labs  Lab 05/22/23 1426 05/23/23 0511 05/24/23 0413 05/25/23 0421 05/26/23 0421  WBC 6.8 6.2 7.0 5.8 4.9  HGB 12.1 11.4* 10.3* 10.7* 11.1*  HCT 37.9 35.1* 32.0* 33.3* 34.4*  MCV 89.6 90.7 90.7 90.0 89.1  PLT 230 218 181 196 212   Cardiac Enzymes: Recent Labs  Lab 05/23/23 0511  CKTOTAL 73   BNP: Invalid input(s): "POCBNP" CBG: No results for input(s): "GLUCAP" in the last 168 hours. D-Dimer No results for input(s): "DDIMER" in the last 72 hours. Hgb A1c No results for input(s): "HGBA1C" in the last 72 hours. Lipid Profile No results for input(s): "CHOL", "HDL", "LDLCALC", "TRIG", "CHOLHDL", "LDLDIRECT" in the last 72 hours. Thyroid function studies Recent Labs    05/26/23 0421  T4TOTAL 5.0   Anemia work up No results for input(s): "VITAMINB12", "FOLATE", "FERRITIN", "TIBC", "IRON", "RETICCTPCT" in the last 72 hours. Urinalysis    Component Value Date/Time   COLORURINE YELLOW 05/22/2023 2240   APPEARANCEUR HAZY (A) 05/22/2023 2240   LABSPEC 1.015 05/22/2023 2240   PHURINE 5.0 05/22/2023 2240   GLUCOSEU NEGATIVE 05/22/2023 2240   HGBUR NEGATIVE 05/22/2023 2240   BILIRUBINUR NEGATIVE 05/22/2023 2240   BILIRUBINUR negative 04/19/2023 1602   KETONESUR 80 (A) 05/22/2023 2240   PROTEINUR NEGATIVE 05/22/2023 2240   UROBILINOGEN 0.2 04/19/2023 1602   UROBILINOGEN 0.2 07/04/2020 0852   NITRITE NEGATIVE 05/22/2023 2240   LEUKOCYTESUR NEGATIVE 05/22/2023 2240   Sepsis Labs Recent Labs  Lab 05/23/23 0511 05/24/23 0413 05/25/23 0421 05/26/23 0421  WBC 6.2 7.0 5.8 4.9    Microbiology Recent Results (from the past 240 hour(s))  Gastrointestinal Panel by PCR , Stool     Status: None   Collection Time: 05/20/23  4:04 PM   Specimen: Stool  Result Value Ref Range Status   Campylobacter species NOT DETECTED NOT DETECTED Final   Plesimonas shigelloides NOT DETECTED NOT DETECTED Final   Salmonella  species NOT DETECTED NOT DETECTED Final   Yersinia enterocolitica NOT DETECTED NOT DETECTED Final   Vibrio species NOT DETECTED NOT DETECTED Final   Vibrio cholerae NOT DETECTED NOT DETECTED Final   Enteroaggregative E coli (EAEC) NOT DETECTED NOT DETECTED Final   Enteropathogenic E coli (EPEC) NOT DETECTED NOT DETECTED Final   Enterotoxigenic E coli (ETEC) NOT DETECTED NOT DETECTED Final   Shiga like toxin producing E coli (STEC) NOT DETECTED NOT DETECTED Final   Shigella/Enteroinvasive E coli (EIEC) NOT DETECTED NOT DETECTED Final   Cryptosporidium NOT DETECTED NOT DETECTED Final   Cyclospora cayetanensis NOT DETECTED NOT DETECTED Final   Entamoeba histolytica NOT DETECTED NOT DETECTED Final   Giardia lamblia NOT DETECTED NOT DETECTED Final   Adenovirus F40/41 NOT DETECTED NOT DETECTED Final   Astrovirus NOT DETECTED NOT DETECTED Final   Norovirus GI/GII NOT DETECTED NOT DETECTED Final   Rotavirus A NOT DETECTED NOT DETECTED Final   Sapovirus (I, II, IV, and V) NOT DETECTED NOT DETECTED Final    Comment: Performed at [facility], [address] [place], [place] [address]  C Difficile Quick Screen w PCR reflex     Status: None   Collection Time: 05/20/23  4:04 PM   Specimen: Stool  Result Value Ref Range Status   C Diff antigen NEGATIVE NEGATIVE Final   C Diff toxin NEGATIVE NEGATIVE Final   C Diff interpretation No C. difficile detected.  Final    Comment: Performed at [facility], [address] [place], [place] [address]     Time coordinating discharge: Over 35 minutes  SIGNED:   Marinda Elk, MD  [facility] 05/28/2023, 10:07 AM Pager   If 7PM-7AM, please contact night-coverage [email or web address] Password [email or web address]

## 2023-05-28 NOTE — Progress Notes (Signed)
Progress Note  Primary GI: Dr. Meridee Score DOA: 05/22/2023         Hospital Day: 7   Subjective  Chief Complaint: Intractable nausea and vomiting   No family was present at the time of my evaluation.   Patient sitting by the window, doing well and hoping to go home today.  We advance her diet yesterday to soft diet, she has been able to eat lunch, dinner and breakfast without any nausea vomiting or abdominal pain. She last had Phenergan and Zofran 2 days ago has not required any as needed medications yesterday or today. Decrease her Reglan yesterday from 10 mg to 5 mg 4 times daily, added Carafate and continued other medications. Patient had a another bowel movement last night, formed stools, denies melena or hematochezia.    Objective   Vital signs in last 24 hours: Temp:  [97.9 F (36.6 C)-98.6 F (37 C)] 98.6 F (37 C) (09/06 0609) Pulse Rate:  [68-93] 74 (09/06 0609) Resp:  [14-20] 14 (09/06 0609) BP: (97-118)/(65-79) 97/65 (09/06 0609) SpO2:  [99 %-100 %] 100 % (09/06 0609) Last BM Date : 05/27/23 Last BM recorded by nurses in past 5 days Stool Type: Type 6 (Mushy consistency with ragged edges) (description provided by pt, she had flushed toilet) (05/24/2023 10:50 PM)  General:   female in no acute distress  Heart:  Regular rate and rhythm; no murmurs Pulm: Clear anteriorly; no wheezing Abdomen:  Soft, Obese AB, Active bowel sounds. No tenderness . Without guarding and Without rebound, No organomegaly appreciated. Extremities:  without  edema. Neurologic:  Alert and  oriented x4;  No focal deficits.  Psych:  Cooperative. Normal mood and affect.  Intake/Output from previous day: 09/05 0701 - 09/06 0700 In: 2442.4 [P.O.:1750; I.V.:692.4] Out: -  Intake/Output this shift: No intake/output data recorded.  Studies/Results: No results found.  Lab Results: Recent Labs    05/26/23 0421  WBC 4.9  HGB 11.1*  HCT 34.4*  PLT 212   BMET Recent Labs     05/26/23 0421 05/27/23 0411  NA 134* 133*  K 3.3* 3.6  CL 101 102  CO2 23 24  GLUCOSE 98 97  BUN <5* <5*  CREATININE 0.89 0.83  CALCIUM 9.5 9.0   LFT Recent Labs    05/26/23 0421  ALBUMIN 3.6   PT/INR No results for input(s): "LABPROT", "INR" in the last 72 hours.   Scheduled Meds:  amLODipine  10 mg Oral QHS   dicyclomine  10 mg Oral TID AC & HS   famotidine  20 mg Oral BID   feeding supplement  1 Container Oral TID BM   metoCLOPramide  5 mg Oral TID AC & HS   pantoprazole  40 mg Oral BID   spironolactone  50 mg Oral QHS   sucralfate  1 g Oral BID AC   Continuous Infusions:  promethazine (PHENERGAN) injection (IM or IVPB)        Patient profile:   47 year old female with longstanding history of intractable nausea and vomiting with extensive workup including EGD 08/2022 showing H. pylori negative gastritis, scheduled for esophageal manometry December 2024.  GES negative.  S/p cholecystectomy   Impression/Plan:   Intractable nausea and vomiting with abdominal pain and diarrhea: -CT abdomen pelvis with contrast shows no acute findings.  Normal pancreas, normal liver, normal stomach, small bowel, and colon. - TSH 7.278 -Cortisol 8.5, normal - CT head WO empty sella nonspecific finding may be seen in IIH otherwise normal  head CT -9/4 EGD 2 cm HH, gastritis, esophagus biopsied but unremarkable, unremarkable duodenum, pathology showed negative H. pylori gastritis, benign small bowel, negative esophageal biopsies. -Having formed stools -Patient is on dicyclomine QID -Pepcid 20 mg twice daily -Protonix 40 mg p.o. BID switched 09/05 - Reglan 5mg  QID (decreased yesterday from 10 mg QID) -Has not received Phenergan or Zofran as needed nausea medications since 9/4.  -Unremarkable workup at this time see above, potentially representing bile acid gastritis, no further diarrhea and I doubt she would be able to tolerate a bile acid sequestrant. -Patient doing much better at  this point, likely ready for discharge with medication suggestion below, further recommendations per Dr. Meridee Score. -Will schedule for follow-up in our office 6 to 8 weeks -Will repeat CBC c-Met today for stability -Continue full diet  - continue protonix 40 mg BID PO, pepcid 20 mg BID PO - continue dicyclomine QID  -Continue Carafate 1 g twice daily before food -Will continue Reglan 5 mg 4 times daily outpatient with taper, then taken as needed but can try to decrease to 3 times a day for a week then 2 times a day for a week then as needed -Patient is better with Zofran, would suggest discharging with Zofran sublingual as needed for rescue medication.   Principal Problem:   Intractable nausea and vomiting Active Problems:   HYPERTENSION, BENIGN SYSTEMIC   CKD (chronic kidney disease) stage 2, GFR 60-89 ml/min   Gastritis and gastroduodenitis    LOS: 5 days   Doree Albee  05/28/2023, 9:42 AM

## 2023-05-28 NOTE — Progress Notes (Signed)
Pt discharge teaching given to pt. Belongings returned- blankets, clothes, cell phone, cell phone charger and purse. Pt taken to front entrance by wheelchair. Transportation provided by family.

## 2023-05-28 NOTE — Plan of Care (Signed)
  Problem: Education: Goal: Knowledge of General Education information will improve Description: Including pain rating scale, medication(s)/side effects and non-pharmacologic comfort measures Outcome: Progressing   Problem: Activity: Goal: Risk for activity intolerance will decrease Outcome: Adequate for Discharge   Problem: Coping: Goal: Level of anxiety will decrease Outcome: Adequate for Discharge

## 2023-05-29 ENCOUNTER — Encounter: Payer: Self-pay | Admitting: Gastroenterology

## 2023-05-29 LAB — VITAMIN B1: Vitamin B1 (Thiamine): 35.5 nmol/L — ABNORMAL LOW (ref 66.5–200.0)

## 2023-05-30 ENCOUNTER — Encounter (HOSPITAL_COMMUNITY): Payer: Self-pay | Admitting: Gastroenterology

## 2023-05-31 ENCOUNTER — Telehealth: Payer: Self-pay

## 2023-05-31 NOTE — Patient Instructions (Signed)
Visit Information  Thank you for taking time to speak to me today about your recent admission and discharge home, reporting that you are feeling better, and have your follow up appointments with your PCP Glendale Chard and Specialist, Dr. Meridee Score. Please feel free to call me at [phone] if you have any health related questions or concerns regarding your transition back home from the hospital.  Warm regards,  Elnita Maxwell    The patient has been provided with contact information for the care management team and has been advised to call with any health related questions or concerns.  No further follow up required: The patient has already called her PCP and made a follow up appt with Dr. Glendale Chard for this Friday, 06/04/23.   Please call 1-800-273-TALK (toll free, 24 hour hotline) if you are experiencing a Mental Health or Behavioral Health Crisis or need someone to talk to.  Alyse Low, RN, BA, [facility], CRRN [facility] Coordinator, Transition of Care Ph # [phone]

## 2023-05-31 NOTE — Transitions of Care (Post Inpatient/ED Visit) (Signed)
05/31/2023  Name: Tamara Church MRN: [id number] DOB: 1976-01-04  Today's TOC FU Call Status: Today's TOC FU Call Status:: Successful TOC FU Call Completed TOC FU Call Complete Date: 05/31/23 Patient's Name and Date of Birth confirmed.  Transition Care Management Follow-up Telephone Call Date of Discharge: 05/28/23 Discharge Facility: [facility]) Type of Discharge: Inpatient Admission Primary Inpatient Discharge Diagnosis:: Intractable nausea with vomiting How have you been since you were released from the hospital?: Better Any questions or concerns?: No  Items Reviewed: Did you receive and understand the discharge instructions provided?: Yes Medications obtained,verified, and reconciled?: Yes (Medications Reviewed) Any new allergies since your discharge?: No Dietary orders reviewed?: Yes Type of Diet Ordered:: Low Sodium Heart Heathy diet  Medications Reviewed Today: Medications Reviewed Today     Reviewed by Marcos Eke, RN (Registered Nurse) on 05/31/23 at 1115  Med List Status: <None>   Medication Order Taking? Sig Documenting Provider Last Dose Status Informant  amLODipine (NORVASC) 10 MG tablet [id number] No Take 1 tablet (10 mg total) by mouth daily. Valetta Close, MD 05/22/2023 2100 Active Self  dicyclomine (BENTYL) 20 MG tablet [id number] No Take 1 tablet daily as needed for chest or abdominal pain Meredith Pel, NP Unknown Active Self  levonorgestrel (MIRENA) 20 MCG/24HR IUD [id number] No 1 each by Intrauterine route once. [provider] 05/22/2023 ongoing Active Self           Med Note Roxine Caddy   Mon May 30, 2018  8:00 PM)    metoCLOPramide (REGLAN) 5 MG tablet [id number]  Take 1 tablet (5 mg total) by mouth 4 (four) times daily -  before meals and at bedtime. Marinda Elk, MD  Active   pantoprazole (PROTONIX) 40 MG tablet [id number]  Take 1 tablet (40 mg total) by mouth 2 (two) times daily. Marinda Elk, MD  Active    RABEprazole (ACIPHEX) 20 MG tablet [id number] No Take 20 mg by mouth 2 (two) times daily. [provider] 05/22/2023 2100 Active Self  spironolactone (ALDACTONE) 25 MG tablet [id number] No Take 2 tablets (50 mg total) by mouth at bedtime. Valetta Close, MD Past Week 2100 Active Self  sucralfate (CARAFATE) 1 g tablet [id number]  Take 1 tablet (1 g total) by mouth 2 (two) times daily before lunch and supper. Marinda Elk, MD  Active             Home Care and Equipment/Supplies: Were Home Health Services Ordered?: NA Any new equipment or medical supplies ordered?: NA  Functional Questionnaire: Do you need assistance with bathing/showering or dressing?: No Do you need assistance with meal preparation?: No Do you need assistance with eating?: No Do you have difficulty maintaining continence: No Do you need assistance with getting out of bed/getting out of a chair/moving?: No Do you have difficulty managing or taking your medications?: No  Follow up appointments reviewed: PCP Follow-up appointment confirmed?: Yes Date of PCP follow-up appointment?: 06/04/23 Follow-up Provider: Dr. Glendale Chard Specialist [facility] Follow-up appointment confirmed?: Yes Date of Specialist follow-up appointment?: 06/15/23 Follow-Up Specialty Provider:: Dr. Meridee Score, GI Do you need transportation to your follow-up appointment?: No Do you understand care options if your condition(s) worsen?: Yes-patient verbalized understanding   Thank you for taking time to speak to me today about your recent admission and discharge home, reporting that you are feeling better, and have your follow up appointments with your PCP and Specialist.   Warm regards,  Franki Monte,  RN, BA, CHPN, CRRN [facility] Care Management Coordinator, Transition of Care Ph # [phone]

## 2023-06-03 ENCOUNTER — Encounter (HOSPITAL_BASED_OUTPATIENT_CLINIC_OR_DEPARTMENT_OTHER): Payer: Self-pay

## 2023-06-03 ENCOUNTER — Other Ambulatory Visit: Payer: Self-pay

## 2023-06-03 ENCOUNTER — Emergency Department (HOSPITAL_BASED_OUTPATIENT_CLINIC_OR_DEPARTMENT_OTHER)
Admission: EM | Admit: 2023-06-03 | Discharge: 2023-06-03 | Disposition: A | Discharge status: Home / Self Care | Payer: Medicaid Other | Attending: Emergency Medicine | Admitting: Emergency Medicine

## 2023-06-03 ENCOUNTER — Emergency Department (HOSPITAL_BASED_OUTPATIENT_CLINIC_OR_DEPARTMENT_OTHER): Payer: Medicaid Other | Admitting: Radiology

## 2023-06-03 DIAGNOSIS — R197 Diarrhea, unspecified: Secondary | ICD-10-CM | POA: Diagnosis not present

## 2023-06-03 DIAGNOSIS — R110 Nausea: Secondary | ICD-10-CM | POA: Insufficient documentation

## 2023-06-03 LAB — COMPREHENSIVE METABOLIC PANEL
ALT: 18 U/L (ref 0–44)
AST: 16 U/L (ref 15–41)
Albumin: 4.2 g/dL (ref 3.5–5.0)
Alkaline Phosphatase: 48 U/L (ref 38–126)
Anion gap: 12 (ref 5–15)
BUN: 7 mg/dL (ref 6–20)
CO2: 25 mmol/L (ref 22–32)
Calcium: 9.8 mg/dL (ref 8.9–10.3)
Chloride: 101 mmol/L (ref 98–111)
Creatinine, Ser: 0.85 mg/dL (ref 0.44–1.00)
GFR, Estimated: >60 mL/min (ref >60)
Glucose, Bld: 102 mg/dL — ABNORMAL HIGH (ref 70–99)
Potassium: 3.3 mmol/L — ABNORMAL LOW (ref 3.5–5.1)
Sodium: 138 mmol/L (ref 135–145)
Total Bilirubin: 0.6 mg/dL (ref 0.3–1.2)
Total Protein: 7.8 g/dL (ref 6.5–8.1)

## 2023-06-03 LAB — CBC
HCT: 34.9 % — ABNORMAL LOW (ref 36.0–46.0)
Hemoglobin: 11.4 g/dL — ABNORMAL LOW (ref 12.0–15.0)
MCH: 28.8 pg (ref 26.0–34.0)
MCHC: 32.7 g/dL (ref 30.0–36.0)
MCV: 88.1 fL (ref 80.0–100.0)
Platelets: 245 10*3/uL (ref 150–400)
RBC: 3.96 MIL/uL (ref 3.87–5.11)
RDW: 13.6 % (ref 11.5–15.5)
WBC: 4.7 10*3/uL (ref 4.0–10.5)
nRBC: 0 % (ref 0.0–0.2)

## 2023-06-03 LAB — PREGNANCY, URINE: Preg Test, Ur: NEGATIVE

## 2023-06-03 LAB — URINALYSIS, ROUTINE W REFLEX MICROSCOPIC
Bacteria, UA: NONE SEEN
Bilirubin Urine: NEGATIVE
Glucose, UA: NEGATIVE mg/dL
Ketones, ur: 15 mg/dL — AB
Nitrite: NEGATIVE
Protein, ur: NEGATIVE mg/dL
Specific Gravity, Urine: 1.011 (ref 1.005–1.030)
pH: 6 (ref 5.0–8.0)

## 2023-06-03 LAB — LIPASE, BLOOD: Lipase: 21 U/L (ref 11–51)

## 2023-06-03 MED ORDER — ACETAMINOPHEN 325 MG PO TABS
650.0000 mg | ORAL_TABLET | Freq: Once | ORAL | Status: AC
  Administered 2023-06-03: 650 mg via ORAL
  Filled 2023-06-03: qty 2

## 2023-06-03 MED ORDER — ALUM & MAG HYDROXIDE-SIMETH 200-200-20 MG/5ML PO SUSP
30.0000 mL | Freq: Once | ORAL | Status: AC
  Administered 2023-06-03: 30 mL via ORAL
  Filled 2023-06-03: qty 30

## 2023-06-03 MED ORDER — ONDANSETRON 4 MG PO TBDP: 4.0000 mg | ORAL_TABLET | Freq: Three times a day (TID) | ORAL | 0 refills | Status: DC | PRN

## 2023-06-03 MED ORDER — ONDANSETRON HCL 4 MG/2ML IJ SOLN
4.0000 mg | Freq: Once | INTRAMUSCULAR | Status: AC
  Administered 2023-06-03: 4 mg via INTRAVENOUS
  Filled 2023-06-03: qty 2

## 2023-06-03 MED ORDER — DROPERIDOL 2.5 MG/ML IJ SOLN
2.5000 mg | Freq: Once | INTRAMUSCULAR | Status: AC
  Administered 2023-06-03: 2.5 mg via INTRAVENOUS
  Filled 2023-06-03: qty 2

## 2023-06-03 MED ORDER — SODIUM CHLORIDE 0.9 % IV BOLUS
1000.0000 mL | Freq: Once | INTRAVENOUS | Status: AC
  Administered 2023-06-03: 1000 mL via INTRAVENOUS

## 2023-06-03 NOTE — Discharge Instructions (Addendum)
I am glad that you are feeling better after receiving medication for your nausea.  Please read the information provided above, as I am suspicious that your nausea is related to your gastritis.  Avoid hot, spicy, acidic, foods, and stay away from chocolate and peppermint as they may invoke worsening gastritis.  Follow-up with your GI doctor as instructed

## 2023-06-03 NOTE — ED Notes (Signed)
 RN reviewed discharge instructions with pt. Pt verbalized understanding and had no further questions. VSS upon discharge.  

## 2023-06-03 NOTE — ED Provider Notes (Signed)
[place] EMERGENCY DEPARTMENT AT [facility] Provider Note   CSN: [id number] Arrival date & time: 06/03/23  1341     History  Chief Complaint  Patient presents with   Abdominal Pain    Tamara Church is a 47 y.o. female, history of gastritis, who presents to the ED secondary to persistent nausea for the last week.  States that about a week ago, she had a scope that she has gastritis.  She tried to take her Reglan, as well as her Phenergan, without relief.  States that she just has persistent nausea, denies any abdominal pain.  Has had a few episodes of loose stools.  Denies any kind of aggravating factors, just ate some beef stew yesterday, has been eating anything today.  States that she has been drinking, but the nausea has been persistent since she went to get checked out..  Denies any chest pain or shortness of breath    Home Medications Prior to Admission medications   Medication Sig Start Date End Date Taking? Authorizing Provider  amLODipine (NORVASC) 10 MG tablet Take 1 tablet (10 mg total) by mouth daily. 07/31/22   Valetta Close, MD  dicyclomine (BENTYL) 20 MG tablet Take 1 tablet daily as needed for chest or abdominal pain 05/17/23   Meredith Pel, NP  levonorgestrel (MIRENA) 20 MCG/24HR IUD 1 each by Intrauterine route once.    [provider]  metoCLOPramide (REGLAN) 5 MG tablet Take 1 tablet (5 mg total) by mouth 4 (four) times daily -  before meals and at bedtime. 05/28/23 06/27/23  Marinda Elk, MD  pantoprazole (PROTONIX) 40 MG tablet Take 1 tablet (40 mg total) by mouth 2 (two) times daily. 05/28/23   Marinda Elk, MD  RABEprazole (ACIPHEX) 20 MG tablet Take 20 mg by mouth 2 (two) times daily.    [provider]  spironolactone (ALDACTONE) 25 MG tablet Take 2 tablets (50 mg total) by mouth at bedtime. 09/17/22   Valetta Close, MD  sucralfate (CARAFATE) 1 g tablet Take 1 tablet (1 g total) by mouth 2 (two) times daily  before lunch and supper. 05/28/23   Marinda Elk, MD      Allergies    Ace inhibitors, Angiotensin receptor blockers, and Hctz [hydrochlorothiazide]    Review of Systems   Review of Systems  Gastrointestinal:  Positive for nausea. Negative for abdominal pain.    Physical Exam Updated Vital Signs BP (!) 130/96 (BP Location: Right Arm)   Pulse 98   Temp 98.1 F (36.7 C)   Resp 18   Ht 5\' 2"  (1.575 m)   Wt 81.6 kg   LMP 05/08/2023 (Exact Date)   SpO2 98%   BMI 32.92 kg/m  Physical Exam Vitals and nursing note reviewed.  Constitutional:      General: She is not in acute distress.    Appearance: She is well-developed.  HENT:     Head: Normocephalic and atraumatic.  Eyes:     Conjunctiva/sclera: Conjunctivae normal.  Cardiovascular:     Rate and Rhythm: Normal rate and regular rhythm.     Heart sounds: No murmur heard. Pulmonary:     Effort: Pulmonary effort is normal. No respiratory distress.     Breath sounds: Normal breath sounds.  Abdominal:     Palpations: Abdomen is soft.     Tenderness: There is no abdominal tenderness.  Musculoskeletal:        General: No swelling.     Cervical  back: Neck supple.  Skin:    General: Skin is warm and dry.     Capillary Refill: Capillary refill takes less than 2 seconds.  Neurological:     Mental Status: She is alert.  Psychiatric:        Mood and Affect: Mood normal.     ED Results / Procedures / Treatments   Labs (all labs ordered are listed, but only abnormal results are displayed) Labs Reviewed  COMPREHENSIVE METABOLIC PANEL - Abnormal; Notable for the following components:      Result Value   Potassium 3.3 (*)    Glucose, Bld 102 (*)    All other components within normal limits  CBC - Abnormal; Notable for the following components:   Hemoglobin 11.4 (*)    HCT 34.9 (*)    All other components within normal limits  URINALYSIS, ROUTINE W REFLEX MICROSCOPIC - Abnormal; Notable for the following components:    Hgb urine dipstick MODERATE (*)    Ketones, ur 15 (*)    Leukocytes,Ua TRACE (*)    All other components within normal limits  LIPASE, BLOOD  PREGNANCY, URINE    EKG None  Radiology DG Abdomen 1 View  Result Date: 06/03/2023 CLINICAL DATA:  Diarrhea and nausea EXAM: ABDOMEN - 1 VIEW COMPARISON:  X-ray 12/22/2019.  CT 05/21/2023 FINDINGS: IUD along the pelvis. Surgical clips in the right upper quadrant. Gas seen in nondilated loops of Jassen Sarver and large bowel. No obstruction. No obvious free air although the diaphragm is clipped off the edge of the film. Presumed vascular calcifications in the pelvis. IMPRESSION: Nonspecific bowel gas pattern.  IUD. The diaphragm is clipped off the edge of the film. Electronically Signed   By: Karen Kays M.D.   On: 06/03/2023 17:13    Procedures Procedures    Medications Ordered in ED Medications  alum & mag hydroxide-simeth (MAALOX/MYLANTA) 200-200-20 MG/5ML suspension 30 mL (30 mLs Oral Given 06/03/23 1514)  ondansetron (ZOFRAN) injection 4 mg (4 mg Intravenous Given 06/03/23 1523)  sodium chloride 0.9 % bolus 1,000 mL (1,000 mLs Intravenous New Bag/Given 06/03/23 1524)  acetaminophen (TYLENOL) tablet 650 mg (650 mg Oral Given 06/03/23 1538)  droperidol (INAPSINE) 2.5 MG/ML injection 2.5 mg (2.5 mg Intravenous Given 06/03/23 1654)    ED Course/ Medical Decision Making/ A&P                                 Medical Decision Making Patient is a 47 year old female, here for intractable nausea.  She has tried her Phenergan suppositories and Reglan without relief.  She states her nausea will not go away.  Denies any chest pain, shortness of breath.  States that she is not really having any abdominal pain at this time, but did have a few episodes of diarrhea this morning.  We will obtain a KUB just to rule out any kind of obstruction, as well as a blood work to check for electrolyte abnormalities, LFT elevation.  Denies any urinary symptoms  Amount and/or  Complexity of Data Reviewed Labs: ordered.    Details: Mild hypokalemia 3.3, otherwise no acute findings Radiology: ordered.    Details: KUB shows no acute findings Discussion of management or test interpretation with external provider(s): Discussed with patient, after second antinausea medicine, patient feeling better, able to tolerate p.o. intake.  Requesting to go home.  I believe that her nausea is likely secondary to her gastritis, and that she  should follow-up with her primary care doctor and GI doctor.  We discussed continue taking the pantoprazole, as instructed, and to use Mylanta, for breakthrough nausea/GERD.  I prescribed her some Zofran, to help as well, and she can continue taking her Reglan, or phenergran  Risk OTC drugs. Prescription drug management.    Final Clinical Impression(s) / ED Diagnoses Final diagnoses:  Nausea    Rx / DC Orders ED Discharge Orders     None         Pete Pelt, PA 06/03/23 1730    Rondel Baton, MD 06/06/23 [phone]

## 2023-06-03 NOTE — ED Notes (Signed)
Pt's nausea resolved.

## 2023-06-03 NOTE — ED Triage Notes (Signed)
Pt presents with abd pain, nausea, frequent burping, and diarrhea that started yesterday. Pt was seen at [facility] on 8/31 for gastritis.

## 2023-06-04 ENCOUNTER — Encounter: Payer: Self-pay | Admitting: Family Medicine

## 2023-06-04 ENCOUNTER — Other Ambulatory Visit: Payer: Self-pay

## 2023-06-04 ENCOUNTER — Ambulatory Visit (INDEPENDENT_AMBULATORY_CARE_PROVIDER_SITE_OTHER): Payer: Medicaid Other | Admitting: Family Medicine

## 2023-06-04 ENCOUNTER — Ambulatory Visit (HOSPITAL_COMMUNITY)
Admission: RE | Admit: 2023-06-04 | Discharge: 2023-06-04 | Disposition: A | Discharge status: Home / Self Care | Payer: Medicaid Other | Source: Ambulatory Visit | Attending: Family Medicine | Admitting: Family Medicine

## 2023-06-04 VITALS — BP 130/85 | HR 74 | Ht 62.0 in | Wt 188.0 lb

## 2023-06-04 DIAGNOSIS — E0781 Sick-euthyroid syndrome: Secondary | ICD-10-CM

## 2023-06-04 DIAGNOSIS — F419 Anxiety disorder, unspecified: Secondary | ICD-10-CM | POA: Diagnosis not present

## 2023-06-04 DIAGNOSIS — R112 Nausea with vomiting, unspecified: Secondary | ICD-10-CM

## 2023-06-04 DIAGNOSIS — K2950 Unspecified chronic gastritis without bleeding: Secondary | ICD-10-CM | POA: Diagnosis not present

## 2023-06-04 DIAGNOSIS — R946 Abnormal results of thyroid function studies: Secondary | ICD-10-CM | POA: Diagnosis not present

## 2023-06-04 DIAGNOSIS — R7989 Other specified abnormal findings of blood chemistry: Secondary | ICD-10-CM

## 2023-06-04 HISTORY — DX: Sick-euthyroid syndrome: E07.81

## 2023-06-04 MED ORDER — SERTRALINE HCL 25 MG PO TABS
25.0000 mg | ORAL_TABLET | Freq: Every day | ORAL | 0 refills | Status: DC
Start: 2023-06-04 — End: 2023-07-21

## 2023-06-04 NOTE — Patient Instructions (Addendum)
It was great to see you today! Thank you for choosing [facility] for your primary care.  Today we addressed: Chronic gastritis Please keep taking all of your medicines as prescribed, with Zofran as needed for breakthroughs and follow up with GI.  Anxiety/panic attacks We will start sertraline (Zoloft) 25 mg and have you follow up with your PCP in 1-2 weeks.  This is a low dose of the medicine and your PCP may go up from here.  She will also further discuss counseling with you, as patient often have the best results with both medicines and counseling.  This medicine will take AT LEAST 4 weeks to start having an effect, please be aware.  We are checking some labs today, including thyroid labs and metabolic panel.  You will get a MyChart message or a letter if results are normal. Otherwise, you will get a call from [place].  You should return to our clinic in 1-2 weeks to follow up with your PCP.  Thank you for coming to see [place] at [facility] and for the opportunity to care for you! Rosan Calbert, MD 06/04/2023, 2:16 PM

## 2023-06-04 NOTE — Assessment & Plan Note (Signed)
>>  ASSESSMENT AND PLAN FOR GASTRITIS WRITTEN ON 06/04/2023  2:44 PM BY SHITAREV, DIMITRY, MD  No symptoms today, was just symptomatic yesterday.  Tolerating p.o. intake well.  Unremarkable physical exam well-hydrated.  This is a chronic issue and follow-up with GI will be most beneficial for the patient at this point. -Patient will continue metoclopramide TID, pantoprazole BID, Carafate BID -Continue to follow-up with GI, next appointment 9/24 -CMP ordered, CBC WNL yesterday

## 2023-06-04 NOTE — Assessment & Plan Note (Addendum)
No symptoms today, was just symptomatic yesterday.  Tolerating p.o. intake well.  Unremarkable physical exam well-hydrated.  This is a chronic issue and follow-up with GI will be most beneficial for the patient at this point. -Patient will continue metoclopramide TID, pantoprazole BID, Carafate BID -Continue to follow-up with GI, next appointment 9/24 -CMP ordered, CBC WNL yesterday

## 2023-06-04 NOTE — Progress Notes (Signed)
SUBJECTIVE:   CHIEF COMPLAINT / HPI:  Tamara Church is a 47 y.o. female with a pertinent past medical history of recurrent nausea and vomiting, GERD, esophagitis, and cholecystectomy in January 2024 presenting to the clinic for hospital follow up after hospitalization for intractable nausea and vomiting.  Chronic Gastritis Patient presented to [facility] on 8/31 for 3 weeks of intractable nausea and vomiting.  Underwent EGD with biopsy on 9/4 showing hiatal hernia and pathology showed reactive gastropathy.  Discharged on pantoprazole BID, Carafate TID, and metoclopramide TID.  Was seen at ED yesterday 9/12 for persistent nausea, which was believed to be secondary to her gastritis.  She was sent home and instructed to take her home medications with Mylanta for breakthrough reflux and given some doses of ondansetron.  Following up with GI on 9/24.  Today, patient reports no nausea or abdominal pain.  No hematochezia.  Last vomited a week or so ago in the hospital.  No blood noticed then.  Does not eat many fatty foods.  Has been tolerating PO intake at home well.  Anxiety Son passed away 3 years ago, has been dealing with her GI illness for a long time.  These things are challenging for her.  For the past 3 years, she has been experiencing sudden moments when she is out of breath, cannot catch her breath, and her heart feels like it is pounding.  She then has to lie down and rest for 30ish minutes to feel better.  Worries about these episodes recurring.  Last episode was Wednesday.  No family history of cardiac conditions.  Has had previous unremarkable workup in ED for cardiac etiologies in the setting of chest pain, last evaluated 05/2020 with normal troponins.  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  No past history of thyroid disease.  No recent weight changes, no increased fatigue, no hair loss, no nail changes.  Will recheck thyroid labs  today.  PERTINENT PMH / PSH: Recurrent nausea and vomiting, GERD, esophagitis, HTN S/p cholecystectomy in January 2024 Smoking: None Alcohol: None   OBJECTIVE:   BP 130/85   Pulse 74   Ht 5\' 2"  (1.575 m)   Wt 188 lb (85.3 kg)   LMP 05/08/2023 (Exact Date)   SpO2 99%   BMI 34.39 kg/m   General: Age-appropriate, resting comfortably in chair, NAD, alert and at baseline. HEENT: MMM.  Smooth thyroid, no nodules.  No LAD. Cardiovascular: Regular rate and rhythm. Normal S1/S2. No murmurs, rubs, or gallops appreciated. 2+ radial pulses. Pulmonary: Clear bilaterally to ascultation. No increased WOB, no accessory muscle usage. No wheezes, crackles, or rhonchi. Abdominal: No tenderness to deep or light palpation. No rebound or guarding. No HSM.  Normoactive bowel sounds. Extremities: No peripheral edema bilaterally. Capillary refill <2 seconds.  Normal nailbeds, no cyanosis or clubbing.  Tests EKG: Normal sinus rhythm, normal rate, normal axis, QTc-B 434 ms.   ASSESSMENT/PLAN:   Gastritis No symptoms today, was just symptomatic yesterday.  Tolerating p.o. intake well.  Unremarkable physical exam well-hydrated.  This is a chronic issue and follow-up with GI will be most beneficial for the patient at this point. -Patient will continue metoclopramide TID, pantoprazole BID, Carafate BID -Continue to follow-up with GI, next appointment 9/24 -CMP ordered, CBC WNL yesterday  Anxiety Primary differential is anxiety versus panic attack, however cardiac etiology and PE still on differential.  Given normal EKG, physical exam, and no cardiac history, arrhythmias less likely.  Patient oxygenating well on room air, currently asymptomatic, no peripheral edema, low concern for PE and Wells score of 0 not warranting D dimer.  Suspect panic attacks given episodic nature of the events and patient's anxiety about recurrence of said events.  At this time, patient wished to trial medications and does not wish  to start counseling. 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  Unremarkable physical exam and history and no recent weight changes or other hypothyroidism symptoms. -Repeat TSH with reflex to T4  Return in about 1 week (around 06/11/2023) for anxiety versus panic attacks.  Ry Moody Sharion Dove, MD [facility]

## 2023-06-04 NOTE — Assessment & Plan Note (Addendum)
Primary differential is anxiety versus panic attack, however cardiac etiology and PE still on differential.  Given normal EKG, physical exam, and no cardiac history, arrhythmias less likely.  Patient oxygenating well on room air, currently asymptomatic, no peripheral edema, low concern for PE and Wells score of 0 not warranting D dimer.  Suspect panic attacks given episodic nature of the events and patient's anxiety about recurrence of said events.  At this time, patient wished to trial medications and does not wish to start counseling. -Starting sertraline 25 mg daily, will titrate up gradually, educated patient that medication will not start working for at least 4 weeks -Follow-up with PCP in 1 to 2 weeks

## 2023-06-04 NOTE — Assessment & Plan Note (Signed)
TSH 7.278 in hospital, most likely euthyroid sick syndrome.  Unremarkable physical exam and history and no recent weight changes or other hypothyroidism symptoms. -Repeat TSH with reflex to T4

## 2023-06-05 LAB — COMPREHENSIVE METABOLIC PANEL
ALT: 69 IU/L — ABNORMAL HIGH (ref 0–32)
AST: 42 IU/L — ABNORMAL HIGH (ref 0–40)
Albumin: 4.3 g/dL (ref 3.9–4.9)
Alkaline Phosphatase: 65 IU/L (ref 44–121)
BUN/Creatinine Ratio: 5 — ABNORMAL LOW (ref 9–23)
BUN: 5 mg/dL — ABNORMAL LOW (ref 6–24)
Bilirubin Total: 0.3 mg/dL (ref 0.0–1.2)
CO2: 25 mmol/L (ref 20–29)
Calcium: 9.8 mg/dL (ref 8.7–10.2)
Chloride: 99 mmol/L (ref 96–106)
Creatinine, Ser: 0.92 mg/dL (ref 0.57–1.00)
Globulin, Total: 3.1 g/dL (ref 1.5–4.5)
Glucose: 94 mg/dL (ref 70–99)
Potassium: 3.9 mmol/L (ref 3.5–5.2)
Sodium: 138 mmol/L (ref 134–144)
Total Protein: 7.4 g/dL (ref 6.0–8.5)
eGFR: 78 mL/min/{1.73_m2} (ref >59)

## 2023-06-05 LAB — TSH RFX ON ABNORMAL TO FREE T4: TSH: 3.18 u[IU]/mL (ref 0.450–4.500)

## 2023-06-07 ENCOUNTER — Encounter: Payer: Self-pay | Admitting: Family Medicine

## 2023-06-14 ENCOUNTER — Telehealth: Payer: Self-pay | Admitting: Gastroenterology

## 2023-06-14 NOTE — Telephone Encounter (Signed)
Spoke with patient regarding instructions for colonoscopy. Patient did not receive instructions the day of her office visit.  Verbal instructions given to the patient by phone.  Advised patient to come by the office to pick up a copy of the instructions, but patient stated she "[place] to far to make it here by 5pm".  Patient advised that I texted a link to MyChart to her phone.  She stated, "she would try to log on when her daughter gets home from work".  Discussed this situation with Dr Meridee Score and he is aware.

## 2023-06-14 NOTE — Telephone Encounter (Signed)
Inbound call from patient requesting to speak about prep instructions for 9/24 colonoscopy. Patient states she has not received any prep instructions. Patient requesting a call back. Please advise, thank you.

## 2023-06-15 ENCOUNTER — Ambulatory Visit (AMBULATORY_SURGERY_CENTER): Payer: Medicaid Other | Admitting: Gastroenterology

## 2023-06-15 ENCOUNTER — Encounter: Payer: Self-pay | Admitting: Gastroenterology

## 2023-06-15 VITALS — BP 123/77 | HR 67 | Temp 97.1°F | Resp 20 | Ht 62.0 in | Wt 191.0 lb

## 2023-06-15 DIAGNOSIS — Z1211 Encounter for screening for malignant neoplasm of colon: Secondary | ICD-10-CM | POA: Diagnosis not present

## 2023-06-15 DIAGNOSIS — F419 Anxiety disorder, unspecified: Secondary | ICD-10-CM | POA: Diagnosis not present

## 2023-06-15 DIAGNOSIS — I10 Essential (primary) hypertension: Secondary | ICD-10-CM | POA: Diagnosis not present

## 2023-06-15 MED ORDER — SODIUM CHLORIDE 0.9 % IV SOLN: 500.0000 mL | Freq: Once | INTRAVENOUS | Status: DC

## 2023-06-15 NOTE — Patient Instructions (Signed)
Resume previous diet Continue present medications There were no colon polyps seen today!  You will need another screening colonoscopy in 10 years, you will receive a letter at that time when you are due for the procedure.   Please call us at [phone] if you have a change in bowel habits, change in family history of colo-rectal cancer, rectal bleeding or other GI concern before that time.  Handouts/information given for hemorrhoids  YOU HAD AN ENDOSCOPIC PROCEDURE TODAY AT THE [facility]:   Refer to the procedure report that was given to you for any specific questions about what was found during the examination.  If the procedure report does not answer your questions, please call your gastroenterologist to clarify.  If you requested that your care partner not be given the details of your procedure findings, then the procedure report has been included in a sealed envelope for you to review at your convenience later.  YOU SHOULD EXPECT: Some feelings of bloating in the abdomen. Passage of more gas than usual.  Walking can help get rid of the air that was put into your GI tract during the procedure and reduce the bloating. If you had a lower endoscopy (such as a colonoscopy or flexible sigmoidoscopy) you may notice spotting of blood in your stool or on the toilet paper. If you underwent a bowel prep for your procedure, you may not have a normal bowel movement for a few days.  Please Note:  You might notice some irritation and congestion in your nose or some drainage.  This is from the oxygen used during your procedure.  There is no need for concern and it should clear up in a day or so.  SYMPTOMS TO REPORT IMMEDIATELY:  Following lower endoscopy (colonoscopy):  Excessive amounts of blood in the stool  Significant tenderness or worsening of abdominal pains  Swelling of the abdomen that is new, acute  Fever of 100F or higher   For urgent or emergent issues, a gastroenterologist  can be reached at any hour by calling [phone]. Do not use MyChart messaging for urgent concerns.    DIET:  We do recommend a small meal at first, but then you may proceed to your regular diet.  Drink plenty of fluids but you should avoid alcoholic beverages for 24 hours.  ACTIVITY:  You should plan to take it easy for the rest of today and you should NOT DRIVE or use heavy machinery until tomorrow (because of the sedation medicines used during the test).    FOLLOW UP: Our staff will call the number listed on your records the next business day following your procedure.  We will call around 7:15- 8:00 am to check on you and address any questions or concerns that you may have regarding the information given to you following your procedure. If we do not reach you, we will leave a message.      SIGNATURES/CONFIDENTIALITY: You and/or your care partner have signed paperwork which will be entered into your electronic medical record.  These signatures attest to the fact that that the information above on your After Visit Summary has been reviewed and is understood.  Full responsibility of the confidentiality of this discharge information lies with you and/or your care-partner.

## 2023-06-15 NOTE — Progress Notes (Signed)
Report to PACU, RN, vss, BBS= Clear.  

## 2023-06-15 NOTE — Progress Notes (Signed)
GASTROENTEROLOGY PROCEDURE H&P NOTE   Primary Care Physician: Glendale Chard, DO  HPI: NOELLA OCEJO is a 47 y.o. female who presents for Colonoscopy for screening.  Past Medical History:  Diagnosis Date   Abdominal pain, epigastric 07/27/2020   Anemia    ANEMIA 09/07/2008   Qualifier: Diagnosis of  By: Earnest Bailey MD, Kim     Anxiety    Bacterial vaginosis 05/08/2020   Calculus of gallbladder without cholecystitis without obstruction 07/05/2021   Constipation 05/18/2020   Early satiety 05/18/2020   Gastritis and gastroduodenitis 07/27/2020   GERD (gastroesophageal reflux disease)    Hypertension    Palpitations 03/22/2020   Prolonged QT interval 10/04/2020   Screening-pulmonary TB 10/08/2021   Sore throat 09/18/2020   Unintentional weight loss 05/18/2020   UTI (urinary tract infection) 04/23/2020   Past Surgical History:  Procedure Laterality Date   BIOPSY  05/26/2023   Procedure: BIOPSY;  Surgeon: Lemar Lofty., MD;  Location: [facility];  Service: Gastroenterology;;   ESOPHAGOGASTRODUODENOSCOPY (EGD) WITH PROPOFOL N/A 05/26/2023   Procedure: ESOPHAGOGASTRODUODENOSCOPY (EGD) WITH PROPOFOL;  Surgeon: Lemar Lofty., MD;  Location: [facility];  Service: Gastroenterology;  Laterality: N/A;   NO PAST SURGERIES     UPPER GASTROINTESTINAL ENDOSCOPY     Current Outpatient Medications  Medication Sig Dispense Refill   amLODipine (NORVASC) 10 MG tablet Take 1 tablet (10 mg total) by mouth daily. 90 tablet 3   levonorgestrel (MIRENA) 20 MCG/24HR IUD 1 each by Intrauterine route once.     metoCLOPramide (REGLAN) 5 MG tablet Take 1 tablet (5 mg total) by mouth 4 (four) times daily -  before meals and at bedtime. (Patient taking differently: Take 5 mg by mouth 3 (three) times daily before meals.) 120 tablet 0   pantoprazole (PROTONIX) 40 MG tablet Take 1 tablet (40 mg total) by mouth 2 (two) times daily. 60 tablet 3   spironolactone (ALDACTONE) 25 MG tablet Take 2  tablets (50 mg total) by mouth at bedtime. 180 tablet 3   sucralfate (CARAFATE) 1 g tablet Take 1 tablet (1 g total) by mouth 2 (two) times daily before lunch and supper. 60 tablet 3   dicyclomine (BENTYL) 20 MG tablet Take 1 tablet daily as needed for chest or abdominal pain 30 tablet 3   ondansetron (ZOFRAN-ODT) 4 MG disintegrating tablet Take 1 tablet (4 mg total) by mouth every 8 (eight) hours as needed. (Patient not taking: Reported on 06/04/2023) 20 tablet 0   sertraline (ZOLOFT) 25 MG tablet Take 1 tablet (25 mg total) by mouth daily. 30 tablet 0   Current Facility-Administered Medications  Medication Dose Route Frequency Provider Last Rate Last Admin   0.9 %  sodium chloride infusion  500 mL Intravenous Once Mansouraty, Netty Starring., MD        Current Outpatient Medications:    amLODipine (NORVASC) 10 MG tablet, Take 1 tablet (10 mg total) by mouth daily., Disp: 90 tablet, Rfl: 3   levonorgestrel (MIRENA) 20 MCG/24HR IUD, 1 each by Intrauterine route once., Disp: , Rfl:    metoCLOPramide (REGLAN) 5 MG tablet, Take 1 tablet (5 mg total) by mouth 4 (four) times daily -  before meals and at bedtime. (Patient taking differently: Take 5 mg by mouth 3 (three) times daily before meals.), Disp: 120 tablet, Rfl: 0   pantoprazole (PROTONIX) 40 MG tablet, Take 1 tablet (40 mg total) by mouth 2 (two) times daily., Disp: 60 tablet, Rfl: 3   spironolactone (ALDACTONE) 25 MG tablet,  Take 2 tablets (50 mg total) by mouth at bedtime., Disp: 180 tablet, Rfl: 3   sucralfate (CARAFATE) 1 g tablet, Take 1 tablet (1 g total) by mouth 2 (two) times daily before lunch and supper., Disp: 60 tablet, Rfl: 3   dicyclomine (BENTYL) 20 MG tablet, Take 1 tablet daily as needed for chest or abdominal pain, Disp: 30 tablet, Rfl: 3   ondansetron (ZOFRAN-ODT) 4 MG disintegrating tablet, Take 1 tablet (4 mg total) by mouth every 8 (eight) hours as needed. (Patient not taking: Reported on 06/04/2023), Disp: 20 tablet, Rfl: 0    sertraline (ZOLOFT) 25 MG tablet, Take 1 tablet (25 mg total) by mouth daily., Disp: 30 tablet, Rfl: 0  Current Facility-Administered Medications:    0.9 %  sodium chloride infusion, 500 mL, Intravenous, Once, Mansouraty, Netty Starring., MD Allergies  Allergen Reactions   Ace Inhibitors Cough   Angiotensin Receptor Blockers     cough   Hctz [Hydrochlorothiazide]     hypokalemia   Family History  Problem Relation Age of Onset   Hypertension Mother    Colon cancer Neg Hx    Esophageal cancer Neg Hx    Rectal cancer Neg Hx    Stomach cancer Neg Hx    Inflammatory bowel disease Neg Hx    Liver disease Neg Hx    Pancreatic cancer Neg Hx    Social History   Socioeconomic History   Marital status: Married    Spouse name: Not on file   Number of children: Not on file   Years of education: Not on file   Highest education level: Not on file  Occupational History   Not on file  Tobacco Use   Smoking status: Never   Smokeless tobacco: Never  Vaping Use   Vaping status: Never Used  Substance and Sexual Activity   Alcohol use: No   Drug use: No   Sexual activity: Yes    Birth control/protection: I.U.D.    Comment: pt would like to start depo provera today  Other Topics Concern   Not on file  Social History Narrative   Not on file   Social Determinants of Health   Financial Resource Strain: Not on file  Food Insecurity: No Food Insecurity (05/31/2023)   Hunger Vital Sign    Worried About Running Out of Food in the Last Year: Never true    Ran Out of Food in the Last Year: Never true  Transportation Needs: No Transportation Needs (05/31/2023)   PRAPARE - Administrator, Civil Service (Medical): No    Lack of Transportation (Non-Medical): No  Physical Activity: Not on file  Stress: Not on file  Social Connections: Not on file  Intimate Partner Violence: Not At Risk (05/23/2023)   Humiliation, Afraid, Rape, and Kick questionnaire    Fear of Current or Ex-Partner: No     Emotionally Abused: No    Physically Abused: No    Sexually Abused: No    Physical Exam: Today's Vitals   06/15/23 0856 06/15/23 0857  BP: 129/74   Pulse: 77   Temp: (!) 97.1 F (36.2 C) (!) 97.1 F (36.2 C)  TempSrc: Temporal   SpO2: 100%   Weight: 191 lb (86.6 kg)   Height: 5\' 2"  (1.575 m)    Body mass index is 34.93 kg/m. GEN: NAD EYE: Sclerae anicteric ENT: MMM CV: Non-tachycardic GI: Soft, NT/ND NEURO:  Alert & Oriented x 3  Lab Results: No results for input(s): "WBC", "HGB", "  HCT", "PLT" in the last 72 hours. BMET No results for input(s): "NA", "K", "CL", "CO2", "GLUCOSE", "BUN", "CREATININE", "CALCIUM" in the last 72 hours. LFT No results for input(s): "PROT", "ALBUMIN", "AST", "ALT", "ALKPHOS", "BILITOT", "BILIDIR", "IBILI" in the last 72 hours. PT/INR No results for input(s): "LABPROT", "INR" in the last 72 hours.   Impression / Plan: This is a 47 y.o.female who presents for Colonoscopy for screening.   The risks and benefits of endoscopic evaluation/treatment were discussed with the patient and/or family; these include but are not limited to the risk of perforation, infection, bleeding, missed lesions, lack of diagnosis, severe illness requiring hospitalization, as well as anesthesia and sedation related illnesses.  The patient's history has been reviewed, patient examined, no change in status, and deemed stable for procedure.  The patient and/or family is agreeable to proceed.    [name], MD [facility] Office # [phone]

## 2023-06-15 NOTE — Op Note (Signed)
[facility] Patient Name: Tamara Church Procedure Date: 06/15/2023 9:10 AM MRN: [id number] Endoscopist: [name] , MD, [id number] Age: 47 Referring MD:  Date of Birth: [birth year] Gender: Female Account #: [id number] Procedure:                Colonoscopy Indications:              Screening for colorectal malignant neoplasm, This                            is the patient's first colonoscopy Medicines:                Monitored Anesthesia Care Procedure:                Pre-Anesthesia Assessment:                           - Prior to the procedure, a History and Physical                            was performed, and patient medications and                            allergies were reviewed. The patient's tolerance of                            previous anesthesia was also reviewed. The risks                            and benefits of the procedure and the sedation                            options and risks were discussed with the patient.                            All questions were answered, and informed consent                            was obtained. Prior Anticoagulants: The patient has                            taken no anticoagulant or antiplatelet agents. ASA                            Grade Assessment: II - A patient with mild systemic                            disease. After reviewing the risks and benefits,                            the patient was deemed in satisfactory condition to                            undergo the procedure.  After obtaining informed consent, the colonoscope                            was passed under direct vision. Throughout the                            procedure, the patient's blood pressure, pulse, and                            oxygen saturations were monitored continuously. The                            CF [id number] #[id number] was introduced through the anus                            and advanced to  the 3 cm into the ileum. The                            colonoscopy was performed without difficulty. The                            patient tolerated the procedure. The quality of the                            bowel preparation was good. The terminal ileum,                            ileocecal valve, appendiceal orifice, and rectum                            were photographed. Scope In: 9:15:39 AM Scope Out: 9:24:08 AM Scope Withdrawal Time: 0 hours 5 minutes 59 seconds  Total Procedure Duration: 0 hours 8 minutes 29 seconds  Findings:                 The digital rectal exam findings include                            hemorrhoids. Pertinent negatives include no                            palpable rectal lesions.                           The terminal ileum and ileocecal valve appeared                            normal.                           Normal mucosa was found in the entire colon.                           Non-bleeding non-thrombosed internal hemorrhoids  were found during retroflexion, during perianal                            exam and during digital exam. The hemorrhoids were                            Grade II (internal hemorrhoids that prolapse but                            reduce spontaneously). Complications:            No immediate complications. Estimated Blood Loss:     Estimated blood loss was minimal. Impression:               - Hemorrhoids found on digital rectal exam.                           - The examined portion of the ileum was normal.                           - Normal mucosa in the entire examined colon.                           - Non-bleeding non-thrombosed internal hemorrhoids. Recommendation:           - The patient will be observed post-procedure,                            until all discharge criteria are met.                           - Discharge patient to home.                           - Patient has a contact number  available for                            emergencies. The signs and symptoms of potential                            delayed complications were discussed with the                            patient. Return to normal activities tomorrow.                            Written discharge instructions were provided to the                            patient.                           - High fiber diet.                           - Continue present medications.                           -  Repeat colonoscopy in 10 years for screening                            purposes.                           - The findings and recommendations were discussed                            with the patient.                           - The findings and recommendations were discussed                            with the patient's family. [name], MD 06/15/2023 9:27:22 AM

## 2023-06-16 ENCOUNTER — Telehealth: Payer: Self-pay | Admitting: *Deleted

## 2023-06-16 NOTE — Telephone Encounter (Signed)
Unable to leave message on f/u call,voice mail not set up

## 2023-06-18 ENCOUNTER — Ambulatory Visit (INDEPENDENT_AMBULATORY_CARE_PROVIDER_SITE_OTHER): Payer: Medicaid Other | Admitting: Student

## 2023-06-18 ENCOUNTER — Encounter: Payer: Self-pay | Admitting: Student

## 2023-06-18 VITALS — BP 117/81 | HR 88 | Ht 62.0 in | Wt 192.8 lb

## 2023-06-18 DIAGNOSIS — F419 Anxiety disorder, unspecified: Secondary | ICD-10-CM | POA: Diagnosis not present

## 2023-06-18 DIAGNOSIS — K047 Periapical abscess without sinus: Secondary | ICD-10-CM | POA: Diagnosis present

## 2023-06-18 MED ORDER — CHLORHEXIDINE GLUCONATE 0.12 % MT SOLN: 15.0000 mL | Freq: Two times a day (BID) | OROMUCOSAL | 0 refills | Status: DC

## 2023-06-18 MED ORDER — HYDROXYZINE HCL 10 MG PO TABS
10.0000 mg | ORAL_TABLET | Freq: Three times a day (TID) | ORAL | 0 refills | Status: DC | PRN
Start: 2023-06-18 — End: 2023-07-21

## 2023-06-18 MED ORDER — AZITHROMYCIN 500 MG PO TABS
500.0000 mg | ORAL_TABLET | Freq: Every day | ORAL | 0 refills | Status: DC
Start: 2023-06-18 — End: 2023-07-21

## 2023-06-18 NOTE — Assessment & Plan Note (Signed)
Prescribed Atarax 10 mg TID PRN  Encouraged patient to start taking Zoloft 25 mg and if she tolerates the medication after 5-7 days, she can increase to 50 mg.  Patient in agreement with plan

## 2023-06-18 NOTE — Progress Notes (Signed)
    SUBJECTIVE:   CHIEF COMPLAINT / HPI:   Tamara Church is a 47 y.o. female  presenting for follow up for anxiety. Recently seen by a partner in the office and was prescribed sertraline 25 mg daily.   She reports not starting the medication due to it not working until she had been taking it for 30 days. She denies having recent panic attacks but is frustrated that she does not have something in the meantime.   Endorsing severe dental pain. She has stopped eating due to the pain and irritation. She has been dealing with other health issues and has not been able to get by the see the dentist. They are trying to work her in next week.   PERTINENT  PMH / PSH: Reviewed and updated   OBJECTIVE:   BP 117/81   Pulse 88   Ht 5\' 2"  (1.575 m)   Wt 192 lb 12.8 oz (87.5 kg)   SpO2 100%   BMI 35.26 kg/m   Well-appearing, no acute distress HEENT: multiple cavities with developing abscess on right upper molar  Cardio: Regular rate, regular rhythm, no murmurs on exam. Pulm: Clear, no wheezing, no crackles. No increased work of breathing Abdominal: bowel sounds present, soft, non-tender, non-distended Extremities: no peripheral edema  Neuro: alert and oriented x3, speech normal in content, no facial asymmetry, strength intact and equal bilaterally in UE and LE, pupils equal and reactive to light.  Psych:  Cognition and judgment appear intact. Alert, communicative  and cooperative with normal attention span and concentration. No apparent delusions, illusions, hallucinations      06/18/2023    2:24 PM 06/04/2023    1:44 PM 04/12/2023    2:26 PM  PHQ9 SCORE ONLY  PHQ-9 Total Score 0 1 0      ASSESSMENT/PLAN:   Anxiety Prescribed Atarax 10 mg TID PRN  Encouraged patient to start taking Zoloft 25 mg and if she tolerates the medication after 5-7 days, she can increase to 50 mg.  Patient in agreement with plan   Dental Pain/Abscess:  On exam gingiva inflamed with possible developing abscess.  Due to patient's prior difficulty with antibiotics including augmenting and cephalosporins will elect to treat with 5 days course of azithromycin as she is preparing to get to the dentist. Also prescribed anti-septic mouth wash for patient to use BID.   Glendale Chard, DO [place] [facility]

## 2023-06-18 NOTE — Patient Instructions (Signed)
Anxiety: try the Atarax for your panic attacks, in the meantime please start taking your Zoloft. If you are tolerating the medication well, you can increase the dose to 50 mg daily.   Tooth pain: I am prescribing antibiotics that you have seemed to tolerate in the past, you can also try a mouth wash that you use twice a day. Swish for 30 seconds then spit.   Future Appointments  Date Time Provider Department Center  08/11/2023 10:00 AM Meredith Pel, NP [facility]    Please arrive 15 minutes before your appointment to ensure smooth check in process.    Please call the clinic at [phone] if your symptoms worsen or you have any concerns.  Thank you for allowing me to participate in your care, Dr. Glendale Chard [facility]

## 2023-07-19 ENCOUNTER — Telehealth: Payer: Self-pay | Admitting: Nurse Practitioner

## 2023-07-19 NOTE — Telephone Encounter (Signed)
Called patient in reference to complaints to hiatal hernia pain. Patient interested in having sooner appt than 08/11/23. No appts available at this time, suggested the patient to to the urgent care or to the ED if the pain is severe enough. Patient also states she needs more medication for nausea. Notified Dr. Meridee Score, please advise.

## 2023-07-19 NOTE — Telephone Encounter (Signed)
Inbound call from patient, would like to be seen sooner than scheduled appointment. Patient is scheduled for 11/20, states she is having a painful episode due to her hiatal hernia.

## 2023-07-20 ENCOUNTER — Other Ambulatory Visit: Payer: Self-pay | Admitting: *Deleted

## 2023-07-20 DIAGNOSIS — K2100 Gastro-esophageal reflux disease with esophagitis, without bleeding: Secondary | ICD-10-CM

## 2023-07-20 DIAGNOSIS — I10 Essential (primary) hypertension: Secondary | ICD-10-CM

## 2023-07-20 DIAGNOSIS — K219 Gastro-esophageal reflux disease without esophagitis: Secondary | ICD-10-CM

## 2023-07-20 DIAGNOSIS — K2970 Gastritis, unspecified, without bleeding: Secondary | ICD-10-CM

## 2023-07-20 NOTE — Telephone Encounter (Signed)
Called patient to inform recommendations of Dr. Meridee Score. Dr. Meridee Score wants the patient to have labs drawn as well as CXR and KUB view 2. Dr. Meridee Score also wants the patient to be scheduled as soon as possible, with noted cancellation for tomorrow. Patient was made aware of the available schedule for tomorrow and excepted the 8:30 appt with Ms. Nat Math, NP. Patient also states she is presently continuing to take the Carafate and it seems to be working for her. She also states she is needing a refill for the Atarax for nausea, and is also happy with this medication as well. Patient was instructed where to go for the lab draw and the x-rays in the [place] building. Also informed there is no scheduled appt needed for the lab or the x-rays. Patient understood and agreed.

## 2023-07-20 NOTE — Telephone Encounter (Signed)
Let her come in for labs in the interim: CBC/CMP/Amylase/Lipase. Have get a CXR and KUB 2-view in the interim as well. Ask her what her regimen of anti-emetics is currently. Ask her if she is taking Carafate at this time too. Her hiatal hernia is small, so I'm not convinced this is the etiology of symptoms. If we have an APP with an urgent visit appointment come up, she should be offered this. Thanks. GM

## 2023-07-20 NOTE — Telephone Encounter (Signed)
Make sure all orders are placed STAT. Thanks. GM

## 2023-07-20 NOTE — Progress Notes (Unsigned)
07/20/2023 Tamara Church [id number] [birth year]   Chief Complaint:  History of Present Illness: Tamara Church is a 47 year old female with a past medical history of anxiety, GERD and constipation. She is known by Dr. Meridee Church.   She is scheduled for esophageal manometry with Dr. Meridee Church 08/25/2023.     Latest Ref Rng & Units 06/03/2023    1:51 PM 05/28/2023    9:38 AM 05/26/2023    4:21 AM  CBC  WBC 4.0 - 10.5 K/uL 4.7  5.2  4.9   Hemoglobin 12.0 - 15.0 g/dL 44.0  34.7  42.5   Hematocrit 36.0 - 46.0 % 34.9  35.4  34.4   Platelets 150 - 400 K/uL 245  213  212        Latest Ref Rng & Units 06/04/2023    4:43 PM 06/03/2023    1:51 PM 05/28/2023    9:38 AM  CMP  Glucose 70 - 99 mg/dL 94  956  387   BUN 6 - 24 mg/dL 5  7  6    Creatinine 0.57 - 1.00 mg/dL 5.64  3.32  9.51   Sodium 134 - 144 mmol/L 138  138  137   Potassium 3.5 - 5.2 mmol/L 3.9  3.3  3.6   Chloride 96 - 106 mmol/L 99  101  101   CO2 20 - 29 mmol/L 25  25  27    Calcium 8.7 - 10.2 mg/dL 9.8  9.8  9.5   Total Protein 6.0 - 8.5 g/dL 7.4  7.8  7.4   Total Bilirubin 0.0 - 1.2 mg/dL 0.3  0.6  0.6   Alkaline Phos 44 - 121 IU/L 65  48  43   AST 0 - 40 IU/L 42  16  14   ALT 0 - 32 IU/L 69  18  26     CTAP with contrast 05/21/2023: FINDINGS: Lower chest: Lung bases are clear.   Hepatobiliary: No focal hepatic lesion. Postcholecystectomy. No biliary dilatation.   Pancreas: Pancreas is normal. No ductal dilatation. No pancreatic inflammation.   Spleen: Normal spleen   Adrenals/urinary tract: Adrenal glands normal. Multiple low-density lesions in the RIGHT kidney. Several larger lesions have simple fluid attenuation. There is motion artifact which limits evaluation of the small lesions. no change from comparison exam on 08/07/2022. LEFT kidney normal. Ureters and bladder normal.   Stomach/Bowel: Stomach, small bowel, appendix, and cecum are normal. The colon and rectosigmoid colon are normal.    Vascular/Lymphatic: Abdominal aorta is normal caliber. No periportal or retroperitoneal adenopathy. No pelvic adenopathy.   Reproductive: IUD in expected location.  Ovaries normal   Other: No free fluid.   Musculoskeletal: No aggressive osseous lesion.   IMPRESSION: 1. No acute findings in the abdomen pelvis. 2. Normal appendix. 3. Postcholecystectomy. 4. Multiple low-density lesions in the RIGHT kidney are favored to represent cysts. No change in sizefrom comparison CT 08/07/2022. No follow-up recommended for benign renal lesion.     EGD 05/26/2023: - No gross lesions in the entire esophagus. Biopsied.  - Z-line irregular, 39 cm from the incisors.  - 2 cm hiatal hernia.  - Erythematous mucosa in the stomach. Biopsied.  - No gross lesions in the duodenal bulb, in the first portion of the duodenum and in the second portion of the duodenum A. STOMACH, BIOPSY:  - Gastric antral and oxyntic mucosa with no specific pathologic changes  - Negative for H. pylori on HE stain  - Negative  for intestinal metaplasia or malignancy   B. DUODENUM, BIOPSY:  - Benign small bowel mucosa with no significant pathologic changes   C. ESOPHAGUS, RANDOM, BIOPSY:  - Benign squamous mucosa with no specific pathologic changes  - Negative for dysplasia or malignancy   Colonoscopy 06/15/2023: - Hemorrhoids found on digital rectal exam. - The examined portion of the ileum was normal. - Normal mucosa in the entire examined colon. 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   Gastritis and gastroduodenitis 07/27/2020   Past Surgical History:  Procedure Laterality Date   BIOPSY  05/26/2023   Procedure: BIOPSY;  Surgeon: Tamara Church., MD;  Location: [facility];  Service:  Gastroenterology;;   ESOPHAGOGASTRODUODENOSCOPY (EGD) WITH PROPOFOL N/A 05/26/2023   Procedure: ESOPHAGOGASTRODUODENOSCOPY (EGD) WITH PROPOFOL;  Surgeon: Tamara Church., MD;  Location: [facility];  Service: Gastroenterology;  Laterality: N/A;   NO PAST SURGERIES     UPPER GASTROINTESTINAL ENDOSCOPY         Current Medications, Allergies, Past Medical History, Past Surgical History, Family History and Social History were reviewed in [employer] record.   Review of Systems:   Constitutional: Negative for fever, sweats, chills or weight loss.  Respiratory: Negative for shortness of breath.   Cardiovascular: Negative for chest pain, palpitations and leg swelling.  Gastrointestinal: See HPI.  Musculoskeletal: Negative for back pain or muscle aches.  Neurological: Negative for dizziness, headaches or paresthesias.    Physical Exam: There were no vitals taken for this visit. General: in no acute distress. Head: Normocephalic and atraumatic. Eyes: No scleral icterus. Conjunctiva pink . Ears: Normal auditory acuity. Mouth: Dentition intact. No ulcers or lesions.  Lungs: Clear throughout to auscultation. Heart: Regular rate and rhythm, no murmur. Abdomen: Soft, nontender and nondistended. No masses or hepatomegaly. Normal bowel sounds x 4 quadrants.  Rectal: Deferred.  Musculoskeletal: Symmetrical with no gross deformities. Extremities: No edema. Neurological: Alert oriented x 4. No focal deficits.  Psychological: Alert and cooperative. Normal mood and affect  Assessment and Recommendations:  Anemia   Elevated LFTs

## 2023-07-21 ENCOUNTER — Other Ambulatory Visit (INDEPENDENT_AMBULATORY_CARE_PROVIDER_SITE_OTHER): Payer: Medicaid Other

## 2023-07-21 ENCOUNTER — Other Ambulatory Visit: Payer: Self-pay

## 2023-07-21 ENCOUNTER — Other Ambulatory Visit: Payer: Self-pay | Admitting: *Deleted

## 2023-07-21 ENCOUNTER — Ambulatory Visit (INDEPENDENT_AMBULATORY_CARE_PROVIDER_SITE_OTHER)
Admission: RE | Admit: 2023-07-21 | Discharge: 2023-07-21 | Disposition: A | Discharge status: Home / Self Care | Payer: Medicaid Other | Source: Ambulatory Visit | Attending: Gastroenterology | Admitting: Gastroenterology

## 2023-07-21 ENCOUNTER — Encounter: Payer: Self-pay | Admitting: Nurse Practitioner

## 2023-07-21 ENCOUNTER — Telehealth: Payer: Self-pay | Admitting: *Deleted

## 2023-07-21 ENCOUNTER — Ambulatory Visit (INDEPENDENT_AMBULATORY_CARE_PROVIDER_SITE_OTHER): Payer: Medicaid Other | Admitting: Nurse Practitioner

## 2023-07-21 ENCOUNTER — Telehealth: Payer: Self-pay

## 2023-07-21 VITALS — BP 130/80 | HR 80 | Ht 62.0 in | Wt 184.4 lb

## 2023-07-21 DIAGNOSIS — K2990 Gastroduodenitis, unspecified, without bleeding: Secondary | ICD-10-CM

## 2023-07-21 DIAGNOSIS — R110 Nausea: Secondary | ICD-10-CM

## 2023-07-21 DIAGNOSIS — K2970 Gastritis, unspecified, without bleeding: Secondary | ICD-10-CM | POA: Diagnosis not present

## 2023-07-21 DIAGNOSIS — K2100 Gastro-esophageal reflux disease with esophagitis, without bleeding: Secondary | ICD-10-CM

## 2023-07-21 DIAGNOSIS — R1013 Epigastric pain: Secondary | ICD-10-CM

## 2023-07-21 DIAGNOSIS — R7989 Other specified abnormal findings of blood chemistry: Secondary | ICD-10-CM | POA: Diagnosis not present

## 2023-07-21 DIAGNOSIS — K219 Gastro-esophageal reflux disease without esophagitis: Secondary | ICD-10-CM

## 2023-07-21 DIAGNOSIS — E876 Hypokalemia: Secondary | ICD-10-CM

## 2023-07-21 DIAGNOSIS — I10 Essential (primary) hypertension: Secondary | ICD-10-CM

## 2023-07-21 DIAGNOSIS — R112 Nausea with vomiting, unspecified: Secondary | ICD-10-CM

## 2023-07-21 LAB — COMPREHENSIVE METABOLIC PANEL
ALT: 14 U/L (ref 0–35)
AST: 14 U/L (ref 0–37)
Albumin: 4.3 g/dL (ref 3.5–5.2)
Alkaline Phosphatase: 49 U/L (ref 39–117)
BUN: 9 mg/dL (ref 6–23)
CO2: 28 meq/L (ref 19–32)
Calcium: 9.9 mg/dL (ref 8.4–10.5)
Chloride: 100 meq/L (ref 96–112)
Creatinine, Ser: 0.89 mg/dL (ref 0.40–1.20)
GFR: 78 mL/min (ref >60.00)
Glucose, Bld: 105 mg/dL — ABNORMAL HIGH (ref 70–99)
Potassium: 3.3 meq/L — ABNORMAL LOW (ref 3.5–5.1)
Sodium: 139 meq/L (ref 135–145)
Total Bilirubin: 0.6 mg/dL (ref 0.2–1.2)
Total Protein: 8 g/dL (ref 6.0–8.3)

## 2023-07-21 LAB — CBC WITH DIFFERENTIAL/PLATELET
Basophils Absolute: 0 10*3/uL (ref 0.0–0.1)
Basophils Relative: 0.5 % (ref 0.0–3.0)
Eosinophils Absolute: 0.1 10*3/uL (ref 0.0–0.7)
Eosinophils Relative: 1 % (ref 0.0–5.0)
HCT: 36.6 % (ref 36.0–46.0)
Hemoglobin: 11.6 g/dL — ABNORMAL LOW (ref 12.0–15.0)
Lymphocytes Relative: 25.1 % (ref 12.0–46.0)
Lymphs Abs: 1.4 10*3/uL (ref 0.7–4.0)
MCHC: 31.8 g/dL (ref 30.0–36.0)
MCV: 91 fL (ref 78.0–100.0)
Monocytes Absolute: 0.4 10*3/uL (ref 0.1–1.0)
Monocytes Relative: 6.3 % (ref 3.0–12.0)
Neutro Abs: 3.8 10*3/uL (ref 1.4–7.7)
Neutrophils Relative %: 67.1 % (ref 43.0–77.0)
Platelets: 254 10*3/uL (ref 150.0–400.0)
RBC: 4.02 Mil/uL (ref 3.87–5.11)
RDW: 15 % (ref 11.5–15.5)
WBC: 5.7 10*3/uL (ref 4.0–10.5)

## 2023-07-21 LAB — IBC + FERRITIN
Ferritin: 25.2 ng/mL (ref 10.0–291.0)
Iron: 78 ug/dL (ref 42–145)
Saturation Ratios: 22.2 % (ref 20.0–50.0)
TIBC: 351.4 ug/dL (ref 250.0–450.0)
Transferrin: 251 mg/dL (ref 212.0–360.0)

## 2023-07-21 LAB — B12 AND FOLATE PANEL
Folate: 5.3 ng/mL — ABNORMAL LOW (ref >5.9)
Vitamin B-12: 236 pg/mL (ref 211–911)

## 2023-07-21 LAB — LIPASE: Lipase: 20 U/L (ref 11.0–59.0)

## 2023-07-21 LAB — AMYLASE: Amylase: 42 U/L (ref 27–131)

## 2023-07-21 MED ORDER — HYDROXYZINE HCL 10 MG PO TABS: 10.0000 mg | ORAL_TABLET | Freq: Two times a day (BID) | ORAL | 0 refills | Status: DC

## 2023-07-21 MED ORDER — FOLIC ACID 1 MG PO TABS: ORAL_TABLET | ORAL | 0 refills | Status: DC

## 2023-07-21 MED ORDER — SUCRALFATE 1 G PO TABS: 1.0000 g | ORAL_TABLET | Freq: Two times a day (BID) | ORAL | 0 refills | Status: DC

## 2023-07-21 MED ORDER — POTASSIUM CHLORIDE ER 20 MEQ PO TBCR: 20.0000 meq | EXTENDED_RELEASE_TABLET | Freq: Every day | ORAL | 0 refills | Status: DC

## 2023-07-21 NOTE — Patient Instructions (Addendum)
Your provider has requested that you go to the basement level for lab work before leaving today. Press "B" on the elevator. The lab is located at the first door on the left as you exit the elevator.  Your provider has requested that you have an abdominal x ray before leaving today. Please go to the basement floor to our Radiology department for the test.  FDGuard- take 1 tablet by mouth twice daily   Continue Rabeprazole 20 mg- 1 by mouth twice daily  Continue Carafate- take 1 by mouth twice daily  Due to recent changes in healthcare laws, you may see the results of your imaging and laboratory studies on MyChart before your provider has had a chance to review them.  We understand that in some cases there may be results that are confusing or concerning to you. Not all laboratory results come back in the same time frame and the provider may be waiting for multiple results in order to interpret others.  Please give [place] 48 hours in order for your provider to thoroughly review all the results before contacting the office for clarification of your results.   Thank you for trusting me with your gastrointestinal care!   Alcide Evener, CRNP

## 2023-07-21 NOTE — Telephone Encounter (Signed)
Called patient to notify that Dr. Meridee Score ordered Potassium Chloride 20 MEq daily for 10 days, Folate 5 mg daily x's 1 month, then 1 mg daily thereafter. Also informed the patient to recheck her folate levels in 2 months. Also to have her PCP to check her BMP level with a F/U. Reminder placed to have the patient's folate done in 2 months. Meds ordered and patient notified to take 5 tablets of the Folic acid daily for 30 days, being that it comes in 1 mg tablets. Patient understood and agreed.

## 2023-07-21 NOTE — Telephone Encounter (Signed)
Medication Atarax has been ordered as noted in patient's chart. Patient called and notified of Dr. Elesa Hacker wishes, stating this medication is the PCP's order and the patient will need to reach out to them for further medication needed. Patient understood and agreed.

## 2023-07-21 NOTE — Telephone Encounter (Signed)
Pt notified of Dr Meridee Score and Alcide Evener NP  Pt was ordered and scheduled for  an MRI/MRCP on 07/23/2023 at 8:00 AM at [facility]. Pt to arrive 30 minutes prior. Nothing to eat or drink 4 hours prior. Pt made aware.  Pt verbalized understanding with all questions answered.

## 2023-07-21 NOTE — Telephone Encounter (Signed)
Message Received: Today Arnaldo Natal, NP  Tamara Darling, RN      Previous Messages  Routed Note  Author: Arnaldo Natal, NP Service: Gastroenterology Author Type: Nurse Practitioner  Filed: 07/21/2023  1:34 PM Encounter Date: 07/21/2023 Status: Signed  Editor: Arnaldo Natal, NP (Nurse Practitioner)  Tamara Church, I contacted the patient and discussed her lab results which showed her LFTs are now normal.  I discussed MRI imaging with Dr. Meridee Score and an abdominal MRI/MRCP with and without contrast was recommended.  Please schedule the patient for an abdominal MRI/MRCP with and without contrast as soon as possible as she is having active nausea, vomiting and epigastric pain.  Thank you.  Patient will pick up Atarax prescription as prescribed earlier today.  She will contact our office if her symptoms worsen.     Office Visit for Gastroesophageal Reflux; Nausea 07/21/2023 Arnaldo Natal, NP - [place] [facility] Diagnoses  Abdominal Pain, Epigastric (Primary) Nausea without vomiting Elevated LFTs Orders Signed This Visit  (8) Orders Pended This Visit  None Progress Notes  Arnaldo Natal, NP at 07/21/2023  8:30 AM  Status: Signed  Tamara Church, I contacted the patient and discussed her lab results which showed her LFTs are now normal.  I discussed MRI imaging with Dr. Meridee Score and an abdominal MRI/MRCP with and without contrast was recommended.  Please schedule the patient for an abdominal MRI/MRCP with and without contrast as soon as possible as she is having active nausea, vomiting and epigastric pain.  Thank you.  Patient will pick up Atarax prescription as prescribed earlier today.  She will contact our office if her symptoms worsen.    Mansouraty, Netty Starring., MD at 07/21/2023  8:30 AM  Status: Signed   Attending Physician's Attestation    I have reviewed the chart.    I agree with the Advanced Practitioner's  note, impression, and recommendations with any updates as below. I think further imaging in setting of normalizing LFTs is reasonable to ensure no evidence of retained choledocholithiasis.  Would move forward with scheduling as able.     [name], MD [facility] Office # [phone]

## 2023-07-21 NOTE — Telephone Encounter (Signed)
-----   Message from [facility] sent at 07/21/2023 11:40 AM EDT ----- Tamara Church or Tamara Church, Let patient know that labs have been reviewed. She has mild hypokalemia. She needs Potassium Chloride 20 mEq daily x 10-days. She will need to followup with PCP thereafter to have BMP rechecked. She has a Folate deficiency.  This could be contributing to her anemia that is persistent but stable. Plan for Folate 5 mg daily for 1 month then 1 mg daily thereafter.  Recheck her Folate level in 26-months. If still has Anemia, but improved Folate level, then will need Hematology evaluation for her anemia (she does not have Iron deficiency). Thanks. GM

## 2023-07-21 NOTE — Telephone Encounter (Signed)
May prescribe 1 month of Atarax 10 mg TID PRN (60/0). This is a prescription by PCP, so she will need to reach out to them if any further medication is needed. Thanks. GM

## 2023-07-21 NOTE — Progress Notes (Signed)
Attending Physician's Attestation   I have reviewed the chart.   I agree with the Advanced Practitioner's note, impression, and recommendations with any updates as below. I think further imaging in setting of normalizing LFTs is reasonable to ensure no evidence of retained choledocholithiasis.  Would move forward with scheduling as able.   [name], MD [facility] Office # [phone]

## 2023-07-21 NOTE — Progress Notes (Signed)
Tamara Church, I contacted the patient and discussed her lab results which showed her LFTs are now normal.  I discussed MRI imaging with Dr. Meridee Score and an abdominal MRI/MRCP with and without contrast was recommended.  Please schedule the patient for an abdominal MRI/MRCP with and without contrast as soon as possible as she is having active nausea, vomiting and epigastric pain.  Thank you.  Patient will pick up Atarax prescription as prescribed earlier today.  She will contact our office if her symptoms worsen.

## 2023-07-23 ENCOUNTER — Ambulatory Visit (HOSPITAL_COMMUNITY)
Admission: RE | Admit: 2023-07-23 | Discharge: 2023-07-23 | Disposition: A | Discharge status: Home / Self Care | Payer: Medicaid Other | Source: Ambulatory Visit | Attending: Nurse Practitioner

## 2023-07-23 ENCOUNTER — Other Ambulatory Visit: Payer: Self-pay | Admitting: Nurse Practitioner

## 2023-07-23 ENCOUNTER — Telehealth: Payer: Self-pay | Admitting: Nurse Practitioner

## 2023-07-23 DIAGNOSIS — R1013 Epigastric pain: Secondary | ICD-10-CM | POA: Diagnosis present

## 2023-07-23 DIAGNOSIS — R112 Nausea with vomiting, unspecified: Secondary | ICD-10-CM | POA: Insufficient documentation

## 2023-07-23 MED ORDER — FAMOTIDINE 20 MG PO TABS: 20.0000 mg | ORAL_TABLET | Freq: Two times a day (BID) | ORAL | 1 refills | Status: DC

## 2023-07-23 MED ORDER — GADOBUTROL 1 MMOL/ML IV SOLN
8.0000 mL | Freq: Once | INTRAVENOUS | Status: AC | PRN
  Administered 2023-07-23: 8 mL via INTRAVENOUS

## 2023-07-23 NOTE — Telephone Encounter (Signed)
Inbound call from patient,  She states Rabeprazole is causing a lot of negative side effects for her and she would like to discuss being prescribed a different medication.

## 2023-07-23 NOTE — Telephone Encounter (Signed)
Pt stated that she has been having side effects from the RABEprazole. Pt stated that she has been tracking this for the last two weeks and after she takes the medication she is having nausea and headaches. Pt is requesting an alternative for the medication.

## 2023-07-23 NOTE — Telephone Encounter (Signed)
I called patient and advised her to stop Rabeprzole. Start Famotidine 20mg  one tab po bid, RX sent to her preferred pharmacy. Patient to contact me on Monday 11/4 if no improvement.

## 2023-07-24 ENCOUNTER — Emergency Department (HOSPITAL_COMMUNITY)
Admission: EM | Admit: 2023-07-24 | Discharge: 2023-07-24 | Disposition: A | Discharge status: Home / Self Care | Payer: Medicaid Other

## 2023-07-24 DIAGNOSIS — R5383 Other fatigue: Secondary | ICD-10-CM | POA: Insufficient documentation

## 2023-07-24 DIAGNOSIS — R110 Nausea: Secondary | ICD-10-CM | POA: Insufficient documentation

## 2023-07-24 LAB — COMPREHENSIVE METABOLIC PANEL
ALT: 28 U/L (ref 0–44)
AST: 20 U/L (ref 15–41)
Albumin: 4.5 g/dL (ref 3.5–5.0)
Alkaline Phosphatase: 50 U/L (ref 38–126)
Anion gap: 10 (ref 5–15)
BUN: 6 mg/dL (ref 6–20)
CO2: 25 mmol/L (ref 22–32)
Calcium: 10.1 mg/dL (ref 8.9–10.3)
Chloride: 102 mmol/L (ref 98–111)
Creatinine, Ser: 0.79 mg/dL (ref 0.44–1.00)
GFR, Estimated: >60 mL/min (ref >60)
Glucose, Bld: 111 mg/dL — ABNORMAL HIGH (ref 70–99)
Potassium: 3.9 mmol/L (ref 3.5–5.1)
Sodium: 137 mmol/L (ref 135–145)
Total Bilirubin: 0.7 mg/dL (ref 0.3–1.2)
Total Protein: 8.6 g/dL — ABNORMAL HIGH (ref 6.5–8.1)

## 2023-07-24 LAB — CBC
HCT: 37.2 % (ref 36.0–46.0)
Hemoglobin: 12.4 g/dL (ref 12.0–15.0)
MCH: 30.2 pg (ref 26.0–34.0)
MCHC: 33.3 g/dL (ref 30.0–36.0)
MCV: 90.7 fL (ref 80.0–100.0)
Platelets: 250 10*3/uL (ref 150–400)
RBC: 4.1 MIL/uL (ref 3.87–5.11)
RDW: 14.5 % (ref 11.5–15.5)
WBC: 6 10*3/uL (ref 4.0–10.5)
nRBC: 0 % (ref 0.0–0.2)

## 2023-07-24 LAB — HCG, SERUM, QUALITATIVE: Preg, Serum: NEGATIVE

## 2023-07-24 LAB — LIPASE, BLOOD: Lipase: 28 U/L (ref 11–51)

## 2023-07-24 MED ORDER — ALUM & MAG HYDROXIDE-SIMETH 200-200-20 MG/5ML PO SUSP
30.0000 mL | Freq: Once | ORAL | Status: AC
Start: 2023-07-24 — End: 2023-07-24
  Administered 2023-07-24: 30 mL via ORAL
  Filled 2023-07-24: qty 30

## 2023-07-24 MED ORDER — ONDANSETRON HCL 4 MG PO TABS: 4.0000 mg | ORAL_TABLET | Freq: Four times a day (QID) | ORAL | 0 refills | Status: AC | PRN

## 2023-07-24 MED ORDER — FAMOTIDINE 20 MG PO TABS
20.0000 mg | ORAL_TABLET | Freq: Once | ORAL | Status: AC
  Administered 2023-07-24: 20 mg via ORAL
  Filled 2023-07-24: qty 1

## 2023-07-24 MED ORDER — PROMETHAZINE HCL 25 MG RE SUPP
25.0000 mg | Freq: Four times a day (QID) | RECTAL | 0 refills | Status: AC | PRN
Start: 2023-07-24 — End: ?

## 2023-07-24 MED ORDER — ONDANSETRON 4 MG PO TBDP
4.0000 mg | ORAL_TABLET | Freq: Once | ORAL | Status: AC
  Administered 2023-07-24: 4 mg via ORAL
  Filled 2023-07-24: qty 1

## 2023-07-24 NOTE — ED Triage Notes (Signed)
Patient reports she has been nauseated all week, unable to eat since yesterday Has reflux, was hospitalized September and diagnosed with hernia.  Denies pain, says she just feels nauseated, can't vomit Takes nausea medication Reports left ear hurts , may have ear infection, ear pain rated 6/10 , ear pain started Wednesday

## 2023-07-24 NOTE — Discharge Instructions (Addendum)
Please follow-up with your gastroenterology doctors and your primary doctors.  Return immediately for fevers, chills, abdominal pain, inability to drink due to nausea vomiting, lightheadedness, passout, chest pain, shortness of breath or any new or worsening symptoms that are concerning to you.

## 2023-07-24 NOTE — ED Provider Notes (Signed)
[place] EMERGENCY DEPARTMENT AT [facility] Provider Note   CSN: [id number] Arrival date & time: 07/24/23  1354     History  Chief Complaint  Patient presents with   Nausea    Tamara Church is a 47 y.o. female.  This is a 47 year old female present emergency department with a chief complaint of nausea.  Seemingly subacute problem that she is been dealing with since September.  She has had worsening symptoms over the past week and a half or so.  Had MRCP yesterday, she is unsure the results.  However she states that she has been nauseated, no vomiting and feeling generally unwell.  She also notes ear pain with history of otitis media.  Symptoms for the past day or 2.  No discharge.  Difficulty hearing.  Denies chest pain, shortness of breath.  No dysuria.        Home Medications Prior to Admission medications   Medication Sig Start Date End Date Taking? Authorizing Provider  ondansetron (ZOFRAN) 4 MG tablet Take 1 tablet (4 mg total) by mouth every 6 (six) hours as needed for up to 7 days for nausea or vomiting. 07/24/23 07/31/23 Yes Quin Mathenia, Harmon Dun, DO  promethazine (PHENERGAN) 25 MG suppository Place 1 suppository (25 mg total) rectally every 6 (six) hours as needed for nausea or vomiting. 07/24/23  Yes Coral Spikes, DO  amLODipine (NORVASC) 10 MG tablet Take 1 tablet (10 mg total) by mouth daily. 07/31/22   Valetta Close, MD  famotidine (PEPCID) 20 MG tablet Take 1 tablet (20 mg total) by mouth 2 (two) times daily. 07/23/23   Arnaldo Natal, NP  folic acid (FOLVITE) 1 MG tablet Take 5 tablets (5 mg total) by mouth daily for 30 days, THEN 1 tablet (1 mg total) daily. 07/21/23 10/19/23  Mansouraty, Netty Starring., MD  hydrOXYzine (ATARAX) 10 MG tablet Take 1 tablet (10 mg total) by mouth 2 (two) times daily. 07/21/23   Mansouraty, Netty Starring., MD  ibuprofen (ADVIL) 800 MG tablet Take 800 mg by mouth every 8 (eight) hours as needed. 07/17/23   [provider]  levonorgestrel (MIRENA) 20 MCG/24HR IUD 1 each by Intrauterine route once.    [provider]  metoCLOPramide (REGLAN) 5 MG tablet Take 1 tablet (5 mg total) by mouth 4 (four) times daily -  before meals and at bedtime. Patient taking differently: Take 5 mg by mouth 3 (three) times daily before meals. 05/28/23 06/27/23  Marinda Elk, MD  ondansetron (ZOFRAN-ODT) 4 MG disintegrating tablet Take 1 tablet (4 mg total) by mouth every 8 (eight) hours as needed. Patient not taking: Reported on 07/21/2023 06/03/23   Small, Brooke L, PA  pantoprazole (PROTONIX) 40 MG tablet Take 1 tablet (40 mg total) by mouth 2 (two) times daily. Patient not taking: Reported on 07/21/2023 05/28/23   Marinda Elk, MD  Potassium Chloride ER 20 MEQ TBCR Take 1 tablet (20 mEq total) by mouth daily for 10 days. 07/21/23 07/31/23  Mansouraty, Netty Starring., MD  spironolactone (ALDACTONE) 25 MG tablet Take 2 tablets (50 mg total) by mouth at bedtime. 09/17/22   Valetta Close, MD  sucralfate (CARAFATE) 1 g tablet Take 1 tablet (1 g total) by mouth 2 (two) times daily. 07/21/23   Mansouraty, Netty Starring., MD      Allergies    Ace inhibitors, Angiotensin receptor blockers, and Hctz [hydrochlorothiazide]    Review of Systems   Review of Systems  Physical  Exam Updated Vital Signs BP 130/89   Pulse 88   Temp 98.5 F (36.9 C) (Oral)   Resp 18   Ht 5\' 2"  (1.575 m)   Wt 83.6 kg   LMP 06/24/2023   SpO2 98%   BMI 33.72 kg/m  Physical Exam Vitals and nursing note reviewed.  Constitutional:      General: She is not in acute distress.    Appearance: She is not toxic-appearing.  HENT:     Head: Normocephalic.     Nose: Nose normal.     Mouth/Throat:     Mouth: Mucous membranes are moist.  Eyes:     Conjunctiva/sclera: Conjunctivae normal.  Cardiovascular:     Rate and Rhythm: Normal rate and regular rhythm.  Pulmonary:     Effort: Pulmonary effort is normal.     Breath sounds:  Normal breath sounds.  Abdominal:     General: Abdomen is flat. Bowel sounds are normal. There is no distension.     Palpations: Abdomen is soft.     Tenderness: There is no abdominal tenderness. There is no guarding or rebound.  Musculoskeletal:        General: Normal range of motion.  Skin:    General: Skin is warm and dry.     Capillary Refill: Capillary refill takes less than 2 seconds.  Neurological:     Mental Status: She is alert and oriented to person, place, and time.  Psychiatric:        Mood and Affect: Mood normal.        Behavior: Behavior normal.     ED Results / Procedures / Treatments   Labs (all labs ordered are listed, but only abnormal results are displayed) Labs Reviewed  COMPREHENSIVE METABOLIC PANEL - Abnormal; Notable for the following components:      Result Value   Glucose, Bld 111 (*)    Total Protein 8.6 (*)    All other components within normal limits  LIPASE, BLOOD  CBC  HCG, SERUM, QUALITATIVE    EKG None  Radiology MR ABDOMEN MRCP W WO CONTAST  Result Date: 07/24/2023 CLINICAL DATA:  Abdominal pain, nausea, vomiting EXAM: MRI ABDOMEN WITHOUT AND WITH CONTRAST (INCLUDING MRCP) TECHNIQUE: Multiplanar multisequence MR imaging of the abdomen was performed both before and after the administration of intravenous contrast. Heavily T2-weighted images of the biliary and pancreatic ducts were obtained, and three-dimensional MRCP images were rendered by post processing. CONTRAST:  8mL GADAVIST GADOBUTROL 1 MMOL/ML IV SOLN COMPARISON:  CT abdomen pelvis, 05/21/2023 FINDINGS: Lower chest:  Cardiomegaly.  Trace pleural effusions. Hepatobiliary: No focal liver abnormality is seen. Status post cholecystectomy. No biliary dilatation. Pancreas: Unremarkable. No pancreatic ductal dilatation or surrounding inflammatory changes. Spleen: Normal in size without significant abnormality. Adrenals/Urinary Tract: Adrenal glands are unremarkable. Simple, benign renal cortical  cysts as well as a hemorrhagic or proteinaceous cyst of the inferior pole of the right kidney, without associated contrast enhancement, unchanged compared to prior examinations and benign. No further follow-up or characterization is required. Kidneys are otherwise normal, without obvious renal calculi, solid lesion, or hydronephrosis. Stomach/Bowel: Stomach is within normal limits. No evidence of bowel wall thickening, distention, or inflammatory changes. Moderate burden of stool throughout the colon. Vascular/Lymphatic: No significant vascular findings are present. No enlarged abdominal lymph nodes. Other: No abdominal wall hernia or abnormality. No ascites. Musculoskeletal: No acute or significant osseous findings. IMPRESSION: 1. No acute MR findings of the abdomen to explain pain, nausea, or vomiting. 2. Status post  cholecystectomy. No biliary dilatation. 3. Cardiomegaly. Trace pleural effusions. Electronically Signed   By: Jearld Lesch M.D.   On: 07/24/2023 15:12   MR 3D Recon At Scanner  Result Date: 07/24/2023 CLINICAL DATA:  Abdominal pain, nausea, vomiting EXAM: MRI ABDOMEN WITHOUT AND WITH CONTRAST (INCLUDING MRCP) TECHNIQUE: Multiplanar multisequence MR imaging of the abdomen was performed both before and after the administration of intravenous contrast. Heavily T2-weighted images of the biliary and pancreatic ducts were obtained, and three-dimensional MRCP images were rendered by post processing. CONTRAST:  8mL GADAVIST GADOBUTROL 1 MMOL/ML IV SOLN COMPARISON:  CT abdomen pelvis, 05/21/2023 FINDINGS: Lower chest:  Cardiomegaly.  Trace pleural effusions. Hepatobiliary: No focal liver abnormality is seen. Status post cholecystectomy. No biliary dilatation. Pancreas: Unremarkable. No pancreatic ductal dilatation or surrounding inflammatory changes. Spleen: Normal in size without significant abnormality. Adrenals/Urinary Tract: Adrenal glands are unremarkable. Simple, benign renal cortical cysts as well  as a hemorrhagic or proteinaceous cyst of the inferior pole of the right kidney, without associated contrast enhancement, unchanged compared to prior examinations and benign. No further follow-up or characterization is required. Kidneys are otherwise normal, without obvious renal calculi, solid lesion, or hydronephrosis. Stomach/Bowel: Stomach is within normal limits. No evidence of bowel wall thickening, distention, or inflammatory changes. Moderate burden of stool throughout the colon. Vascular/Lymphatic: No significant vascular findings are present. No enlarged abdominal lymph nodes. Other: No abdominal wall hernia or abnormality. No ascites. Musculoskeletal: No acute or significant osseous findings. IMPRESSION: 1. No acute MR findings of the abdomen to explain pain, nausea, or vomiting. 2. Status post cholecystectomy. No biliary dilatation. 3. Cardiomegaly. Trace pleural effusions. Electronically Signed   By: Jearld Lesch M.D.   On: 07/24/2023 15:12    Procedures Procedures    Medications Ordered in ED Medications  ondansetron (ZOFRAN-ODT) disintegrating tablet 4 mg (4 mg Oral Given 07/24/23 1600)  alum & mag hydroxide-simeth (MAALOX/MYLANTA) 200-200-20 MG/5ML suspension 30 mL (30 mLs Oral Given 07/24/23 1600)  famotidine (PEPCID) tablet 20 mg (20 mg Oral Given 07/24/23 1600)    ED Course/ Medical Decision Making/ A&P Clinical Course as of 07/24/23 2344  Sat Jul 24, 2023  1524 Per chart review: MRCP yesterday: "IMPRESSION: 1. No acute MR findings of the abdomen to explain pain, nausea, or vomiting. 2. Status post cholecystectomy. No biliary dilatation. 3. Cardiomegaly. Trace pleural effusions. " [TY]  1646 Patient reevaluated.  She is tolerating p.o. and notes that she has been out of Zofran at home.  She has upcoming follow-up with GI.  Will represcribe antiemetics for home use. [TY]    Clinical Course User Index [TY] Coral Spikes, DO                                 Medical  Decision Making Is a well-appearing 47 year old female present emergency department for nausea.  She is afebrile, nontachycardic hemodynamically stable.  Physical exam with soft nontender abdomen.  She is nauseated, but not vomiting.  She is having bowel movements and passing flatus.  Low suspicion for acute obstruction.  Per chart review had negative MRCP yesterday.  Her symptoms seemingly are subacute in nature and she is being followed by GI.  She had EGD and colonoscopy done in September with small 2 cm hiatal hernia, but otherwise normal EGD.  Normal colonoscopy.  Labs today largely reassuring.  No leukocytosis to suggest systemic infection.  No significant metabolic derangements.  No transaminitis or elevated  bilirubin to suggest hepatobiliary disease.  Lipase normal.  Pancreatitis unlikely.  Negative pregnancy test.  Unclear etiology of patient's symptoms.  However given her overall reassuring workup today with reassuring vitals, physical exam and lab work CT scan would reveal acute process.  Will proceed with supportive care.  Patient's left ear with some minor erythema, will give antibiotics for otitis media.  See ED course for final MDM and disposition.   Amount and/or Complexity of Data Reviewed Labs: ordered.  Risk OTC drugs. Prescription drug management.          Final Clinical Impression(s) / ED Diagnoses Final diagnoses:  Nausea    Rx / DC Orders ED Discharge Orders          Ordered    ondansetron (ZOFRAN) 4 MG tablet  Every 6 hours PRN        07/24/23 1649    promethazine (PHENERGAN) 25 MG suppository  Every 6 hours PRN        07/24/23 1649              Coral Spikes, DO 07/24/23 2344

## 2023-08-02 ENCOUNTER — Ambulatory Visit (INDEPENDENT_AMBULATORY_CARE_PROVIDER_SITE_OTHER): Payer: Medicaid Other | Admitting: Family Medicine

## 2023-08-02 ENCOUNTER — Encounter: Payer: Self-pay | Admitting: Family Medicine

## 2023-08-02 VITALS — BP 123/87 | HR 84 | Ht 62.0 in | Wt 185.2 lb

## 2023-08-02 DIAGNOSIS — E876 Hypokalemia: Secondary | ICD-10-CM | POA: Diagnosis not present

## 2023-08-02 DIAGNOSIS — I517 Cardiomegaly: Secondary | ICD-10-CM | POA: Insufficient documentation

## 2023-08-02 NOTE — Assessment & Plan Note (Addendum)
Recent hypokalemia (K 3.3). Likely 2/2 to vomiting. Patient was prescribed potassium chloride from GI. Will check BMP today. GI to follow up

## 2023-08-02 NOTE — Patient Instructions (Signed)
It was wonderful to see you today.  Please bring ALL of your medications with you to every visit.   Today we talked about:  Your MRI results. We are going to get a blood test to check your heart function. If there is any abnormalities in that test, it can tell us if further investigation is needed. I will call you with the results.   I also got your blood work to check your potassium today. These results will be shared with your gastroenterologist (GI doc) who can adjust your medication as needed.  Thank you for choosing [facility].   Please call [phone] with any questions about today's appointment.  Please arrive at least 15 minutes prior to your scheduled appointments.   If you had blood work today, I will send you a MyChart message or a letter if results are normal. Otherwise, I will give you a call.   If you had a referral placed, they will call you to set up an appointment. Please give [place] a call if you don't hear back in the next 2 weeks.   If you need additional refills before your next appointment, please call your pharmacy first.   Hal Morales, MD Family Medicine

## 2023-08-02 NOTE — Progress Notes (Signed)
    SUBJECTIVE:   CHIEF COMPLAINT / HPI:  MRCP results showed cardiomegaly and trace bilatearl pleural effusions. Here for follow up of these.  No chest pain, no shortness of breath, no swelling.   No fam hx of cardiac disease.   No night sweats. No abdominal pain. No distention.   PERTINENT  PMH / PSH:  - HTN - Gastritis   OBJECTIVE:   BP 123/87   Pulse 84   Ht 5\' 2"  (1.575 m)   Wt 185 lb 3.2 oz (84 kg)   LMP 06/24/2023   SpO2 100%   BMI 33.87 kg/m   General: A&O, NAD HEENT: No sign of trauma, EOM grossly intact Cardiac: RRR, no m/r/g Respiratory: CTAB, normal WOB, no w/c/r GI: Soft, NTTP, non-distended  Extremities: NTTP, no peripheral edema. Neuro: Normal gait, moves all four extremities appropriately. Psych: Appropriate mood and affect  ASSESSMENT/PLAN:   Cardiomegaly Patient presents for cardiomegaly and trace pleural effusions incidentally found on MRCP. Denies any symptoms of chest pain, shortness of breath, orthopnea. Has no hx of heart disease. Saw cardiology in August for EKG, which was normal. Recent lab work works no indication of kidney or liver diease. Given patient is asymptomatic, will obtain BNP. If abnormal, will pursue further cardiac workup. - BNP   Hypokalemia Recent hypokalemia (K 3.3). Likely 2/2 to vomiting. Patient was prescribed potassium chloride from GI. Will check BMP today. GI to follow up    Hal Morales, MD [facility]

## 2023-08-02 NOTE — Assessment & Plan Note (Signed)
Patient presents for cardiomegaly and trace pleural effusions incidentally found on MRCP. Denies any symptoms of chest pain, shortness of breath, orthopnea. Has no hx of heart disease. Saw cardiology in August for EKG, which was normal. Recent lab work works no indication of kidney or liver diease. Given patient is asymptomatic, will obtain BNP. If abnormal, will pursue further cardiac workup. - BNP

## 2023-08-03 LAB — BASIC METABOLIC PANEL
BUN/Creatinine Ratio: 14 (ref 9–23)
BUN: 12 mg/dL (ref 6–24)
CO2: 23 mmol/L (ref 20–29)
Calcium: 10 mg/dL (ref 8.7–10.2)
Chloride: 103 mmol/L (ref 96–106)
Creatinine, Ser: 0.87 mg/dL (ref 0.57–1.00)
Glucose: 109 mg/dL — ABNORMAL HIGH (ref 70–99)
Potassium: 4 mmol/L (ref 3.5–5.2)
Sodium: 140 mmol/L (ref 134–144)
eGFR: 84 mL/min/{1.73_m2} (ref >59)

## 2023-08-03 LAB — BRAIN NATRIURETIC PEPTIDE: BNP: 7.1 pg/mL (ref 0.0–100.0)

## 2023-08-09 ENCOUNTER — Other Ambulatory Visit: Payer: Self-pay | Admitting: Family Medicine

## 2023-08-09 ENCOUNTER — Encounter: Payer: Self-pay | Admitting: Family Medicine

## 2023-08-10 ENCOUNTER — Telehealth: Payer: Self-pay

## 2023-08-10 NOTE — Telephone Encounter (Signed)
Labs have been mailed. Penni Bombard CMA

## 2023-08-10 NOTE — Telephone Encounter (Signed)
-----   Message from [facility] sent at 08/09/2023 10:37 AM EST ----- Regarding: RE: rResults Pending the letter I think!!! ----- Message ----- From: Penni Bombard, CMA Sent: 08/06/2023   3:37 PM EST To: Mahnoor Baloch, MD Subject: RE: rResults                                   I currently do not see an letter for her in the "Letters tab". You can still use the message but it has to be generated to an actual letter so when we print it off it would have everything needed on there. Penni Bombard CMA ----- Message ----- From: Penne Lash, MD Sent: 08/04/2023   8:13 PM EST To: [facility] Subject: rResults                                       Please send letter to patient regarding results.   Hi Tamara Church,  Good news. Your BNP level came back within normal range. This tells [place] your heart is not currently working harder than it should. This does not rule out heart strain or failure, but it does re-assure me that you we do not need to pursue further work up as you are not experiencing symptoms. If you start to feel shortness of breath, chest pain or tightness, difficulty catching your breath, or swelling in you extremities, please seek medical care.   Your potassium also came back within normal limits. Your doctor will also get these results.   Kindly,  Dr. Georg Ruddle ----- Message ----- From: Interface, [facility] Lab Results In Sent: 08/03/2023   8:15 AM EST To: Penne Lash, MD

## 2023-08-11 ENCOUNTER — Ambulatory Visit (INDEPENDENT_AMBULATORY_CARE_PROVIDER_SITE_OTHER): Payer: Medicaid Other | Admitting: Nurse Practitioner

## 2023-08-11 ENCOUNTER — Encounter: Payer: Self-pay | Admitting: Nurse Practitioner

## 2023-08-11 VITALS — BP 116/84 | HR 88 | Ht 62.0 in | Wt 185.0 lb

## 2023-08-11 DIAGNOSIS — R112 Nausea with vomiting, unspecified: Secondary | ICD-10-CM | POA: Diagnosis not present

## 2023-08-11 MED ORDER — SUCRALFATE 1 G PO TABS: 1.0000 g | ORAL_TABLET | Freq: Two times a day (BID) | ORAL | 5 refills | Status: DC

## 2023-08-11 MED ORDER — ONDANSETRON 4 MG PO TBDP: 4.0000 mg | ORAL_TABLET | Freq: Three times a day (TID) | ORAL | 5 refills | Status: DC | PRN

## 2023-08-11 NOTE — Progress Notes (Signed)
ASSESSMENT    Brief Narrative:  47 y.o.  female known to Dr. Meridee Score with a past medical history not limited to cholelithiasis status post cholecystectomy, anxiety, chronic nausea, vomiting and intermittent epigastric pain  Chronic nausea and vomiting vrs regurgitation due to GERD. Extensive unrevealing workup. She is scheduled to have a 24 hr ph study on 12/4.   See PMH below for additional history  PLAN   --Will refill Zofran 4 mg Q 8 hours prn nausea --Continue twice daily famotidine --Continue twice daily Carafate --Monitor for constipation given use of constipating medications --Patient is asking whether she is suppose to hold GERD medications for the upcoming 24 hr pH study.  I did not order the study but I will check to see what Dr. Elesa Hacker preference is and let her know   HPI   Chief complaint : nausea  and belching.   GI History:  Tamara Church is here for follow-up of her chronic nausea and vomiting. She has seen numerous providers in our office for ongoing symptoms.  Additionally she has had nearly 20 ED visits in the last 6 months, mostly for evaluation of these symptoms.  She has had an extensive GI workup including EGD, colonoscopy, CT scans , gastric emptying study, MRI/ MRCP.  She had her gallbladder out several months ago .   She was last seen in the office 07/21/2023 by Alcide Evener, NP for weight loss, mildly elevated LFTs, GERD, and chronic nausea and vomiting.  MRI/MRCP was unremarkable.  LFTs subsequently returned to normal.  Since seeing Jill Side in the office she has been back to the ED on 11-24 with nausea.  Labs unrevealing including a pregnancy test . She is here for follow-up  Interval History Babbette  complains of ongoing postprandial nausea two -three times a week.  She cannot figure out any particular foods that trigger the nausea. Avoids fatty foods.  Nausea eventually leads to what sounds like waterbrash.  Taking Pepcid BID and Carafate BID.  Takes [name] as needed but doesn't always help.  She is out of Zofran  would like a refill  Her weight has stabilized at 185 pounds      Latest Ref Rng & Units 07/24/2023    2:13 PM 07/21/2023    9:29 AM 06/04/2023    4:43 PM  Hepatic Function  Total Protein 6.5 - 8.1 g/dL 8.6  8.0  7.4   Albumin 3.5 - 5.0 g/dL 4.5  4.3  4.3   AST 15 - 41 U/L 20  14  42   ALT 0 - 44 U/L 28  14  69   Alk Phosphatase 38 - 126 U/L 50  49  65   Total Bilirubin 0.3 - 1.2 mg/dL 0.7  0.6  0.3        Latest Ref Rng & Units 07/24/2023    2:13 PM 07/21/2023    9:29 AM 06/03/2023    1:51 PM  CBC  WBC 4.0 - 10.5 K/uL 6.0  5.7  4.7   Hemoglobin 12.0 - 15.0 g/dL 60.4  54.0  98.1   Hematocrit 36.0 - 46.0 % 37.2  36.6  34.9   Platelets 150 - 400 K/uL 250  254.0  245      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   Gastritis and gastroduodenitis 07/27/2020    Past Surgical History:  Procedure Laterality Date   BIOPSY  05/26/2023   Procedure: BIOPSY;  Surgeon: Meridee Score Netty Starring., MD;  Location: [facility];  Service: Gastroenterology;;   ESOPHAGOGASTRODUODENOSCOPY (EGD) WITH PROPOFOL N/A 05/26/2023   Procedure: ESOPHAGOGASTRODUODENOSCOPY (EGD) WITH PROPOFOL;  Surgeon: Lemar Lofty., MD;  Location: [facility];  Service: Gastroenterology;  Laterality: N/A;   NO PAST SURGERIES     UPPER GASTROINTESTINAL ENDOSCOPY      Family History  Problem Relation Age of Onset   Hypertension Mother    Colon cancer Neg Hx    Esophageal cancer Neg Hx    Rectal cancer Neg Hx    Stomach cancer Neg Hx    Inflammatory bowel disease Neg Hx    Liver disease Neg Hx    Pancreatic cancer Neg Hx     Current Medications, Allergies, Family History and Social History were reviewed in [employer] record.     Current Outpatient Medications  Medication Sig  Dispense Refill   amLODipine (NORVASC) 10 MG tablet Take 1 tablet by mouth once daily 90 tablet 0   famotidine (PEPCID) 20 MG tablet Take 1 tablet (20 mg total) by mouth 2 (two) times daily. 60 tablet 1   folic acid (FOLVITE) 1 MG tablet Take 5 tablets (5 mg total) by mouth daily for 30 days, THEN 1 tablet (1 mg total) daily. 210 tablet 0   hydrOXYzine (ATARAX) 10 MG tablet Take 1 tablet (10 mg total) by mouth 2 (two) times daily. 30 tablet 0   levonorgestrel (MIRENA) 20 MCG/24HR IUD 1 each by Intrauterine route once.     ondansetron (ZOFRAN-ODT) 4 MG disintegrating tablet Take 1 tablet (4 mg total) by mouth every 8 (eight) hours as needed. 20 tablet 0   promethazine (PHENERGAN) 25 MG suppository Place 1 suppository (25 mg total) rectally every 6 (six) hours as needed for nausea or vomiting. 12 each 0   spironolactone (ALDACTONE) 25 MG tablet Take 2 tablets (50 mg total) by mouth at bedtime. 180 tablet 3   sucralfate (CARAFATE) 1 g tablet Take 1 tablet (1 g total) by mouth 2 (two) times daily. 20 tablet 0   No current facility-administered medications for this visit.    Review of Systems: No chest pain. No shortness of breath. No urinary complaints.   Physical Exam  Filed Weights   08/11/23 0959  Weight: 185 lb (83.9 kg)   Wt Readings from Last 3 Encounters:  08/11/23 185 lb (83.9 kg)  08/02/23 185 lb 3.2 oz (84 kg)  07/24/23 184 lb 6 oz (83.6 kg)    BP 116/84   Pulse 88   Ht 5\' 2"  (1.575 m)   Wt 185 lb (83.9 kg)   LMP 06/24/2023   BMI 33.84 kg/m  Constitutional:  Pleasant, generally well appearing female in no acute distress. Psychiatric: Normal mood and affect. Behavior is normal. EENT: Pupils normal.  Conjunctivae are normal. No scleral icterus. Neck supple.  Cardiovascular: Normal rate, regular rhythm.  Pulmonary/chest: Effort normal and breath sounds normal. No wheezing, rales or rhonchi. Abdominal: Soft, nondistended, nontender. Bowel sounds active throughout. There  are no masses palpable. No hepatomegaly. Neurological: Alert and oriented to person place and time.    Willette Cluster, NP  08/11/2023, 10:12 AM

## 2023-08-11 NOTE — Patient Instructions (Signed)
_______________________________________________________  If your blood pressure at your visit was 140/90 or greater, please contact your primary care physician to follow up on this.  _______________________________________________________  If you are age 46 or older, your body mass index should be between 23-30. Your Body mass index is 33.84 kg/m. If this is out of the aforementioned range listed, please consider follow up with your Primary Care Provider.  If you are age 72 or younger, your body mass index should be between 19-25. Your Body mass index is 33.84 kg/m. If this is out of the aformentioned range listed, please consider follow up with your Primary Care Provider.   ________________________________________________________  The [place] [facility] providers would like to encourage you to use [facility] to communicate with providers for non-urgent requests or questions.  Due to long hold times on the telephone, sending your provider a message by [facility] may be a faster and more efficient way to get a response.  Please allow 48 business hours for a response.  Please remember that this is for non-urgent requests.  _______________________________________________________  We have sent the following medications to your pharmacy for you to pick up at your convenience: Carafate Zofran  You have been scheduled for an appointment with Dr. Meridee Score on 11-19-2023 at 910am . Please arrive 10 minutes early for your appointment.  Please call us a week before if you haven't heard from [place] about if you need to hold your acid reflux medication or not for your manometry.  It was a pleasure to see you today!  Thank you for trusting me with your gastrointestinal care!

## 2023-08-12 ENCOUNTER — Ambulatory Visit: Payer: Medicaid Other | Admitting: Family Medicine

## 2023-08-12 VITALS — BP 139/87 | HR 89 | Ht 62.0 in | Wt 184.2 lb

## 2023-08-12 DIAGNOSIS — R300 Dysuria: Secondary | ICD-10-CM | POA: Diagnosis not present

## 2023-08-12 DIAGNOSIS — N3000 Acute cystitis without hematuria: Secondary | ICD-10-CM

## 2023-08-12 LAB — POCT URINALYSIS DIP (MANUAL ENTRY)
Bilirubin, UA: NEGATIVE
Glucose, UA: NEGATIVE mg/dL
Nitrite, UA: POSITIVE — AB
Protein Ur, POC: 30 mg/dL — AB
Spec Grav, UA: >=1.03 — AB (ref 1.010–1.025)
Urobilinogen, UA: 0.2 U/dL
pH, UA: 6 (ref 5.0–8.0)

## 2023-08-12 MED ORDER — NITROFURANTOIN MONOHYD MACRO 100 MG PO CAPS: 100.0000 mg | ORAL_CAPSULE | Freq: Two times a day (BID) | ORAL | 0 refills | Status: AC

## 2023-08-12 MED ORDER — FLUCONAZOLE 150 MG PO TABS: 150.0000 mg | ORAL_TABLET | Freq: Once | ORAL | 0 refills | Status: AC

## 2023-08-12 NOTE — Progress Notes (Signed)
Attending Physician's Attestation   I have reviewed the chart.   I agree with the Advanced Practitioner's note, impression, and recommendations with any updates as below. Difficult situation with recurrent nausea consistent with either a CVS versus functional nausea.  Will need to consider potential TCA in future.  Should also consider role of potential scheduled antiemetic no matter what.  Seems like she has failed omeprazole and pantoprazole and Aciphex and GIST remains on Pepcid with Carafate.  If she can hold Pepcid for 5 days prior to her pH impedance test I think that we will make the most sense that we can truly discern if she has reflux and if she does have reflux then is not acid related or not.   [name], MD [facility] Office # [phone]

## 2023-08-12 NOTE — Progress Notes (Addendum)
    SUBJECTIVE:   CHIEF COMPLAINT / HPI:   UTI Reports pain with urination that began 3 days ago.  Some suprapubic pain.  History of UTI earlier this year, resistant to Keflex therefore treated with Macrobid with resolution.  Denies vaginal discharge.  Denies new sexual partner.  Currently on menstrual cycle.  No recent antibiotic use but states every time she takes antibiotics she does get vaginal yeast infection.  Minimal pain in right flank but denies systemic symptoms such as fever and N/V   PERTINENT  PMH / PSH: Hypertension, GERD, prediabetes, anxiety  OBJECTIVE:   BP 139/87   Pulse 89   Ht 5\' 2"  (1.575 m)   Wt 184 lb 3.2 oz (83.6 kg)   LMP 06/24/2023   SpO2 100%   BMI 33.69 kg/m   General: NAD, pleasant, able to participate in exam Cardiac: RRR, no murmurs. Respiratory: CTAB, normal effort, No wheezes, rales or rhonchi Abdomen: +BS, soft, nondistended.  Mild tender to palpation in suprapubic region. MSK: Minimal CVA tenderness on right side, negative on left side. Extremities: no edema or cyanosis. Skin: warm and dry, no rashes noted Neuro: alert, no obvious focal deficits Psych: Normal affect and mood  ASSESSMENT/PLAN:   Assessment & Plan Acute cystitis without hematuria POC UA consistent with UTI given positive nitrites.  Will send for urine culture but prior culture from this year showed sensitivity to Macrobid therefore will start treatment today.  On menstrual cycle and low concern for STI. -Macrobid twice daily x 5 days -Follow-up urine culture results and adjust antibiotics as indicated -Discussed UTI prevention such as urinating after intercourse, avoiding fragrance and soaps in the vagina and good hydration -Rx sent for Diflucan x 1 as patient frequently gets vaginal yeast infection after taking antibiotics.  Advised her to fill if she becomes symptomatic.       Dr. Elberta Fortis, DO [facility]

## 2023-08-12 NOTE — Patient Instructions (Signed)
It was wonderful to see you today! Thank you for choosing [facility].   Please bring ALL of your medications with you to every visit.   Today we talked about:  You have a urinary tract infection, please take the Macrobid twice per day for the next 5 days.  We got a urine culture today, I will follow-up with those results if it shows it is not sensitive to the antibiotic you are on I will give you a call to change the antibiotic. I am also sending in the Diflucan which is the medication for a vaginal yeast infection.  Please do not take it unless you have the vaginal itching and increased white discharge. If you develop symptoms of fever, worsening flank pain and increasing nausea/vomiting that I would like you to be seen sooner as this can develop into a kidney infection.  Please follow up as needed for persistent symptoms  If you haven't already, sign up for My Chart to have easy access to your labs results, and communication with your primary care physician.   We are checking some labs today. If they are abnormal, I will call you. If they are normal, I will send you a MyChart message (if it is active) or a letter in the mail. If you do not hear about your labs in the next 2 weeks, please call the office.  Call the clinic at [phone] if your symptoms worsen or you have any concerns.  Please be sure to schedule follow up at the front desk before you leave today.   Elberta Fortis, DO Family Medicine

## 2023-08-12 NOTE — Progress Notes (Deleted)
    SUBJECTIVE:   CHIEF COMPLAINT / HPI:   UTI Reports pain with urination that began 3 days ago.  Some suprapubic pain.  History of UTI earlier this year, resistant to Keflex therefore treated with Macrobid with resolution.  Denies vaginal discharge.  Denies new sexual partner.  Currently on menstrual cycle.  No recent antibiotic use but states every time she takes antibiotics she does get vaginal yeast infection.  Minimal pain in right flank but denies systemic symptoms such as fever and N/V  PERTINENT  PMH / PSH: Hypertension, GERD, prediabetes, anxiety  OBJECTIVE:   BP 139/87   Pulse 89   Ht 5\' 2"  (1.575 m)   Wt 184 lb 3.2 oz (83.6 kg)   LMP 06/24/2023   SpO2 100%   BMI 33.69 kg/m    General: NAD, pleasant, able to participate in exam Cardiac: RRR, no murmurs. Respiratory: CTAB, normal effort, No wheezes, rales or rhonchi Abdomen: +BS, soft, nondistended.  Mild tender to palpation in suprapubic region. MSK: Minimal CVA tenderness on right side, negative on left side. Extremities: no edema or cyanosis. Skin: warm and dry, no rashes noted Neuro: alert, no obvious focal deficits Psych: Normal affect and mood  ASSESSMENT/PLAN:   Assessment & Plan Acute cystitis without hematuria POC UA consistent with UTI given positive nitrites.  Will send for urine culture but prior culture from this year showed sensitivity to Macrobid therefore will start treatment today.  On menstrual cycle and low concern for STI. -Macrobid twice daily x 5 days -Follow-up urine culture results and adjust antibiotics as indicated -Discussed UTI prevention such as urinating after intercourse, avoiding fragrance and soaps in the vagina and good hydration -Rx sent for Diflucan x 1 as patient frequently gets vaginal yeast infection after taking antibiotics.  Advised her to fill if she becomes symptomatic.   Dr. Elberta Fortis, DO [facility]    {    This will disappear when note  is signed, click to select method of visit    :1}

## 2023-08-16 ENCOUNTER — Encounter: Payer: Self-pay | Admitting: Family Medicine

## 2023-08-16 LAB — URINE CULTURE

## 2023-08-24 ENCOUNTER — Telehealth: Payer: Self-pay | Admitting: *Deleted

## 2023-08-24 NOTE — Telephone Encounter (Signed)
Patient was called and notified of [name]' recommendations. Patient was informed to hold the reflux medication Pepcid, for 5 days prior to the procedure. Patient states she is to have the procedure tomorrow on 08/25/23, and " its too late for that", patient also states " I called on Friday 08/20/23, to ask if I was supposed to take the medication or not and received no callback." Patient informed she will be notified as soon as I am notified, if anything different at this point. Patient understood.

## 2023-08-24 NOTE — Telephone Encounter (Signed)
-----   Message from Willette Cluster sent at 08/13/2023 11:02 PM EST ----- Alona Bene,  I saw patient in clinic recently. She was already scheduled for an impedence study ordered by Dr Meridee Score. I told her I would clarify with him if he wanted her off reflux meds for the study. Please call and let her know that he wants her to hold pepcid for 5 days prior to procedure. She shouldn't take any other reflux meds either during this 5 days period.   Thanks

## 2023-08-25 ENCOUNTER — Encounter (HOSPITAL_COMMUNITY): Payer: Self-pay | Admitting: Gastroenterology

## 2023-08-25 ENCOUNTER — Ambulatory Visit (HOSPITAL_COMMUNITY)
Admission: RE | Admit: 2023-08-25 | Discharge: 2023-08-25 | Disposition: A | Discharge status: Home / Self Care | Payer: Medicaid Other | Attending: Gastroenterology | Admitting: Gastroenterology

## 2023-08-25 ENCOUNTER — Encounter (HOSPITAL_COMMUNITY)
Admission: RE | Disposition: A | Discharge status: Home / Self Care | Payer: Self-pay | Source: Home / Self Care | Attending: Gastroenterology

## 2023-08-25 DIAGNOSIS — R1310 Dysphagia, unspecified: Secondary | ICD-10-CM

## 2023-08-25 DIAGNOSIS — K224 Dyskinesia of esophagus: Secondary | ICD-10-CM

## 2023-08-25 DIAGNOSIS — R142 Eructation: Secondary | ICD-10-CM

## 2023-08-25 DIAGNOSIS — K449 Diaphragmatic hernia without obstruction or gangrene: Secondary | ICD-10-CM | POA: Insufficient documentation

## 2023-08-25 DIAGNOSIS — K219 Gastro-esophageal reflux disease without esophagitis: Secondary | ICD-10-CM

## 2023-08-25 DIAGNOSIS — R112 Nausea with vomiting, unspecified: Secondary | ICD-10-CM | POA: Diagnosis not present

## 2023-08-25 DIAGNOSIS — R110 Nausea: Secondary | ICD-10-CM | POA: Diagnosis present

## 2023-08-25 HISTORY — PX: ESOPHAGEAL MANOMETRY: SHX5429

## 2023-08-25 HISTORY — PX: 24 HOUR PH STUDY: SHX5419

## 2023-08-25 SURGERY — MANOMETRY, ESOPHAGUS
Anesthesia: Choice

## 2023-08-25 MED ORDER — LIDOCAINE VISCOUS HCL 2 % MT SOLN
OROMUCOSAL | Status: AC
  Filled 2023-08-25: qty 15

## 2023-08-25 SURGICAL SUPPLY — 2 items
FACESHIELD LNG OPTICON STERILE (SAFETY) IMPLANT
GLOVE BIO SURGEON STRL SZ8 (GLOVE) ×2 IMPLANT

## 2023-08-25 NOTE — Progress Notes (Signed)
Esophageal Manometry done per protocol. Patient tolerated well without distress or complication. Patient unable to tolerate 24h pH probe placement and requested to stop.  MD notified.  Patient verbalized understanding office will contact with results and plan to go forward.

## 2023-08-26 NOTE — Telephone Encounter (Signed)
Called patient to find out what happened, if she followed through with the procedure or if Dr. Meridee Score cancelled the procedure. Patient states she did have the Manometry done, although was not able to take the machine home for 24 hours due to the discomfort and was not able to tolerate. Patient also states she had been informed the acid reflux was measurable and completed in that aspect.

## 2023-08-30 ENCOUNTER — Encounter (HOSPITAL_COMMUNITY): Payer: Self-pay | Admitting: Gastroenterology

## 2023-09-14 ENCOUNTER — Other Ambulatory Visit: Payer: Self-pay | Admitting: Nurse Practitioner

## 2023-09-14 ENCOUNTER — Other Ambulatory Visit: Payer: Self-pay

## 2023-09-14 ENCOUNTER — Emergency Department (HOSPITAL_COMMUNITY)
Admission: EM | Admit: 2023-09-14 | Discharge: 2023-09-14 | Disposition: A | Discharge status: Home / Self Care | Payer: Medicaid Other | Attending: Emergency Medicine | Admitting: Emergency Medicine

## 2023-09-14 ENCOUNTER — Emergency Department (HOSPITAL_COMMUNITY): Payer: Medicaid Other

## 2023-09-14 ENCOUNTER — Encounter (HOSPITAL_COMMUNITY): Payer: Self-pay

## 2023-09-14 DIAGNOSIS — R0789 Other chest pain: Secondary | ICD-10-CM | POA: Diagnosis not present

## 2023-09-14 DIAGNOSIS — Q765 Cervical rib: Secondary | ICD-10-CM | POA: Diagnosis not present

## 2023-09-14 DIAGNOSIS — R1030 Lower abdominal pain, unspecified: Secondary | ICD-10-CM | POA: Diagnosis not present

## 2023-09-14 DIAGNOSIS — R531 Weakness: Secondary | ICD-10-CM | POA: Diagnosis not present

## 2023-09-14 DIAGNOSIS — R079 Chest pain, unspecified: Secondary | ICD-10-CM | POA: Insufficient documentation

## 2023-09-14 DIAGNOSIS — J029 Acute pharyngitis, unspecified: Secondary | ICD-10-CM | POA: Insufficient documentation

## 2023-09-14 DIAGNOSIS — R0602 Shortness of breath: Secondary | ICD-10-CM | POA: Diagnosis not present

## 2023-09-14 DIAGNOSIS — R197 Diarrhea, unspecified: Secondary | ICD-10-CM | POA: Diagnosis not present

## 2023-09-14 DIAGNOSIS — R109 Unspecified abdominal pain: Secondary | ICD-10-CM | POA: Diagnosis not present

## 2023-09-14 LAB — CBC
HCT: 37.8 % (ref 36.0–46.0)
Hemoglobin: 12.3 g/dL (ref 12.0–15.0)
MCH: 29.6 pg (ref 26.0–34.0)
MCHC: 32.5 g/dL (ref 30.0–36.0)
MCV: 90.9 fL (ref 80.0–100.0)
Platelets: 260 10*3/uL (ref 150–400)
RBC: 4.16 MIL/uL (ref 3.87–5.11)
RDW: 13.7 % (ref 11.5–15.5)
WBC: 7.3 10*3/uL (ref 4.0–10.5)
nRBC: 0 % (ref 0.0–0.2)

## 2023-09-14 LAB — HEPATIC FUNCTION PANEL
ALT: 16 U/L (ref 0–44)
AST: 19 U/L (ref 15–41)
Albumin: 4.1 g/dL (ref 3.5–5.0)
Alkaline Phosphatase: 49 U/L (ref 38–126)
Bilirubin, Direct: <0.1 mg/dL (ref 0.0–0.2)
Total Bilirubin: 0.5 mg/dL (ref <1.2)
Total Protein: 8.4 g/dL — ABNORMAL HIGH (ref 6.5–8.1)

## 2023-09-14 LAB — BASIC METABOLIC PANEL
Anion gap: 10 (ref 5–15)
BUN: 7 mg/dL (ref 6–20)
CO2: 23 mmol/L (ref 22–32)
Calcium: 9.6 mg/dL (ref 8.9–10.3)
Chloride: 103 mmol/L (ref 98–111)
Creatinine, Ser: 0.83 mg/dL (ref 0.44–1.00)
GFR, Estimated: >60 mL/min (ref >60)
Glucose, Bld: 119 mg/dL — ABNORMAL HIGH (ref 70–99)
Potassium: 4.1 mmol/L (ref 3.5–5.1)
Sodium: 136 mmol/L (ref 135–145)

## 2023-09-14 LAB — URINALYSIS, ROUTINE W REFLEX MICROSCOPIC
Bilirubin Urine: NEGATIVE
Glucose, UA: NEGATIVE mg/dL
Ketones, ur: 20 mg/dL — AB
Leukocytes,Ua: NEGATIVE
Nitrite: NEGATIVE
Protein, ur: NEGATIVE mg/dL
Specific Gravity, Urine: 1.009 (ref 1.005–1.030)
pH: 6 (ref 5.0–8.0)

## 2023-09-14 LAB — GROUP A STREP BY PCR: Group A Strep by PCR: NOT DETECTED

## 2023-09-14 LAB — HCG, SERUM, QUALITATIVE: Preg, Serum: NEGATIVE

## 2023-09-14 LAB — TROPONIN I (HIGH SENSITIVITY)
Troponin I (High Sensitivity): 5 ng/L (ref <18)
Troponin I (High Sensitivity): 5 ng/L (ref <18)

## 2023-09-14 LAB — LIPASE, BLOOD: Lipase: 25 U/L (ref 11–51)

## 2023-09-14 MED ORDER — ONDANSETRON HCL 4 MG/2ML IJ SOLN
4.0000 mg | Freq: Once | INTRAMUSCULAR | Status: AC
  Administered 2023-09-14: 4 mg via INTRAVENOUS
  Filled 2023-09-14: qty 2

## 2023-09-14 MED ORDER — IOHEXOL 350 MG/ML SOLN
75.0000 mL | Freq: Once | INTRAVENOUS | Status: AC | PRN
  Administered 2023-09-14: 75 mL via INTRAVENOUS

## 2023-09-14 MED ORDER — FENTANYL CITRATE PF 50 MCG/ML IJ SOSY
50.0000 ug | PREFILLED_SYRINGE | Freq: Once | INTRAMUSCULAR | Status: DC
  Filled 2023-09-14: qty 1

## 2023-09-14 MED ORDER — LOPERAMIDE HCL 2 MG PO CAPS: 2.0000 mg | ORAL_CAPSULE | Freq: Four times a day (QID) | ORAL | 0 refills | Status: DC | PRN

## 2023-09-14 MED ORDER — PROCHLORPERAZINE MALEATE 10 MG PO TABS: 10.0000 mg | ORAL_TABLET | Freq: Two times a day (BID) | ORAL | 0 refills | Status: DC | PRN

## 2023-09-14 MED ORDER — ACETAMINOPHEN 325 MG PO TABS
650.0000 mg | ORAL_TABLET | Freq: Once | ORAL | Status: AC
  Administered 2023-09-14: 650 mg via ORAL
  Filled 2023-09-14: qty 2

## 2023-09-14 NOTE — ED Provider Notes (Signed)
[place] EMERGENCY DEPARTMENT AT [facility] Provider Note   CSN: [id number] Arrival date & time: 09/14/23  1635     History {Add pertinent medical, surgical, social history, OB history to HPI:1} Chief Complaint  Patient presents with   Abdominal Pain   Shortness of Breath   Weakness   Chest Pain    UNIQUA RUSCOE is a 47 y.o. female who presents emergency department with chief complaint of diarrhea abdominal pain and sore throat.  She has a history of reflux.  She has had nausea without vomiting.  All this began around 11 AM.  She had chills and subjective fever.  She has had multiple episodes of watery diarrhea.  She denies any recent foreign travel, ingestion of suspicious foods or or contacts with similar symptoms.  She complaining a lot of tenderness in her lower abdomen.  She denies urinary symptoms.   Abdominal Pain Associated symptoms: chest pain and shortness of breath   Shortness of Breath Associated symptoms: abdominal pain and chest pain   Weakness Associated symptoms: abdominal pain, chest pain and shortness of breath   Chest Pain Associated symptoms: abdominal pain, shortness of breath and weakness        Home Medications Prior to Admission medications   Medication Sig Start Date End Date Taking? Authorizing Provider  amLODipine (NORVASC) 10 MG tablet Take 1 tablet by mouth once daily 08/10/23   Elberta Fortis, MD  famotidine (PEPCID) 20 MG tablet Take 1 tablet (20 mg total) by mouth 2 (two) times daily. 07/23/23   Arnaldo Natal, NP  folic acid (FOLVITE) 1 MG tablet Take 5 tablets (5 mg total) by mouth daily for 30 days, THEN 1 tablet (1 mg total) daily. 07/21/23 10/19/23  Mansouraty, Netty Starring., MD  hydrOXYzine (ATARAX) 10 MG tablet Take 1 tablet (10 mg total) by mouth 2 (two) times daily. 07/21/23   Mansouraty, Netty Starring., MD  levonorgestrel (MIRENA) 20 MCG/24HR IUD 1 each by Intrauterine route once.    [provider]   ondansetron (ZOFRAN-ODT) 4 MG disintegrating tablet Take 1 tablet (4 mg total) by mouth every 8 (eight) hours as needed. 08/11/23   Meredith Pel, NP  promethazine (PHENERGAN) 25 MG suppository Place 1 suppository (25 mg total) rectally every 6 (six) hours as needed for nausea or vomiting. 07/24/23   Coral Spikes, DO  spironolactone (ALDACTONE) 25 MG tablet Take 2 tablets (50 mg total) by mouth at bedtime. 09/17/22   Valetta Close, MD  sucralfate (CARAFATE) 1 g tablet Take 1 tablet (1 g total) by mouth 2 (two) times daily. 08/11/23   Meredith Pel, NP      Allergies    Ace inhibitors, Angiotensin receptor blockers, and Hctz [hydrochlorothiazide]    Review of Systems   Review of Systems  Respiratory:  Positive for shortness of breath.   Cardiovascular:  Positive for chest pain.  Gastrointestinal:  Positive for abdominal pain.  Neurological:  Positive for weakness.    Physical Exam Updated Vital Signs BP (!) 142/91   Pulse (!) 108   Temp 98.2 F (36.8 C) (Oral)   Resp 18   Ht 5\' 2"  (1.575 m)   Wt 83.6 kg   SpO2 100%   BMI 33.71 kg/m  Physical Exam Vitals and nursing note reviewed.  Constitutional:      General: She is not in acute distress.    Appearance: She is well-developed. She is not diaphoretic.  HENT:     Head:  Normocephalic and atraumatic.     Right Ear: External ear normal.     Left Ear: External ear normal.     Nose: Nose normal.     Mouth/Throat:     Mouth: Mucous membranes are moist.     Pharynx: Uvula midline. Oropharyngeal exudate and posterior oropharyngeal erythema present.  Eyes:     General: No scleral icterus.    Extraocular Movements: Extraocular movements intact.     Conjunctiva/sclera: Conjunctivae normal.     Pupils: Pupils are equal, round, and reactive to light.  Cardiovascular:     Rate and Rhythm: Normal rate and regular rhythm.     Heart sounds: Normal heart sounds. No murmur heard.    No friction rub. No gallop.  Pulmonary:      Effort: Pulmonary effort is normal. No respiratory distress.     Breath sounds: Normal breath sounds.  Abdominal:     General: Bowel sounds are normal. There is no distension.     Palpations: Abdomen is soft. There is no mass.     Tenderness: There is no abdominal tenderness. There is no guarding.  Musculoskeletal:     Cervical back: Normal range of motion.  Skin:    General: Skin is warm and dry.  Neurological:     Mental Status: She is alert and oriented to person, place, and time.  Psychiatric:        Behavior: Behavior normal.     ED Results / Procedures / Treatments   Labs (all labs ordered are listed, but only abnormal results are displayed) Labs Reviewed  BASIC METABOLIC PANEL - Abnormal; Notable for the following components:      Result Value   Glucose, Bld 119 (*)    All other components within normal limits  URINALYSIS, ROUTINE W REFLEX MICROSCOPIC - Abnormal; Notable for the following components:   APPearance HAZY (*)    Hgb urine dipstick SMALL (*)    Ketones, ur 20 (*)    Bacteria, UA RARE (*)    All other components within normal limits  CBC  HCG, SERUM, QUALITATIVE  LIPASE, BLOOD  HEPATIC FUNCTION PANEL  TROPONIN I (HIGH SENSITIVITY)  TROPONIN I (HIGH SENSITIVITY)    EKG None  Radiology DG Chest 2 View Result Date: 09/14/2023 CLINICAL DATA:  Chest pain. Abdominal pain. Shortness of breath. Weakness. Left-sided chest pain. EXAM: CHEST - 2 VIEW COMPARISON:  07/21/2023 FINDINGS: Heart size and pulmonary vascularity are normal. Lungs are clear. No pleural effusions. No pneumothorax. Mediastinal contours appear intact. Right cervical rib. Degenerative changes in the spine. IMPRESSION: No active cardiopulmonary disease. Electronically Signed   By: Burman Nieves M.D.   On: 09/14/2023 17:42    Procedures Procedures  {Document cardiac monitor, telemetry assessment procedure when appropriate:1}  Medications Ordered in ED Medications  fentaNYL  (SUBLIMAZE) injection 50 mcg (has no administration in time range)  ondansetron (ZOFRAN) injection 4 mg (has no administration in time range)    ED Course/ Medical Decision Making/ A&P   {   Click here for ABCD2, HEART and other calculatorsREFRESH Note before signing :1}                              Medical Decision Making Amount and/or Complexity of Data Reviewed Labs: ordered. Radiology: ordered.  Risk Prescription drug management.   ***  {Document critical care time when appropriate:1} {Document review of labs and clinical decision tools ie heart score, Chads2Vasc2  etc:1}  {Document your independent review of radiology images, and any outside records:1} {Document your discussion with family members, caretakers, and with consultants:1} {Document social determinants of health affecting pt's care:1} {Document your decision making why or why not admission, treatments were needed:1} Final Clinical Impression(s) / ED Diagnoses Final diagnoses:  None    Rx / DC Orders ED Discharge Orders     None

## 2023-09-14 NOTE — ED Notes (Signed)
Patient transported to CT 

## 2023-09-14 NOTE — ED Triage Notes (Signed)
Pt c/o left sided chest pain, SOB, generalized weakness and generalized abdominal pain, nausea and diarrhea started today at 1100.

## 2023-09-14 NOTE — Discharge Instructions (Signed)
Contact a health care provider if: You have a fever. Your diarrhea gets worse. You have new symptoms. You vomit every time you eat or drink. You feel light-headed, dizzy, or have a headache. You have muscle cramps. You have signs of dehydration, such as: Dark urine, very little urine, or no urine. Cracked lips. Dry mouth. Sunken eyes. Sleepiness. Weakness. You have bloody or black stools or stools that look like tar. You have severe pain, cramping, or bloating in your abdomen. Your skin feels cold and clammy. You feel confused. Get help right away if: You have chest pain or your heart is beating very quickly. You have trouble breathing or you are breathing very quickly. You feel extremely weak or you faint. These symptoms may be an emergency. Get help right away. Call 911. Do not wait to see if the symptoms will go away. Do not drive yourself to the hospital. 

## 2023-09-17 DIAGNOSIS — R1310 Dysphagia, unspecified: Secondary | ICD-10-CM

## 2023-09-17 DIAGNOSIS — R112 Nausea with vomiting, unspecified: Secondary | ICD-10-CM

## 2023-09-20 ENCOUNTER — Telehealth: Payer: Self-pay | Admitting: *Deleted

## 2023-09-20 NOTE — Telephone Encounter (Signed)
Called patient to notify of the folate recheck is due. Patient states she will be in tomorrow. Informed the patient no appt is needed and order is already placed.

## 2023-09-20 NOTE — Telephone Encounter (Signed)
-----   Message from Nurse Alona Bene B sent at 07/21/2023 12:59 PM EDT ----- Recheck the folate level in 2 months.

## 2023-09-24 ENCOUNTER — Ambulatory Visit: Payer: Medicaid Other | Admitting: Family Medicine

## 2023-09-24 VITALS — BP 122/78 | HR 88 | Ht 62.0 in | Wt 186.2 lb

## 2023-09-24 DIAGNOSIS — R630 Anorexia: Secondary | ICD-10-CM | POA: Diagnosis present

## 2023-09-24 NOTE — Patient Instructions (Signed)
 Good to see you today - Thank you for coming in  Things we discussed today:  1) I am sending a referral for you to see a nutritionist.  They can help assess your diet and optimize your nutrition.  They can also help you with maintaining a healthy weight.  2) also try to keep a food diary for the next week.  Try to keep track of what foods you are eating, how much you are eating, how often you are eating.  This can help you see how much nutrition you are able to get in a day and what adjustments we need to make.

## 2023-09-24 NOTE — Progress Notes (Signed)
    SUBJECTIVE:   CHIEF COMPLAINT / HPI:   TM is a 48yo F w/ hx of chronic N/V, hiatal hernia, s/p cholexcystectomy, GERD that p/f decreased appetite.  - Starting in September, got hospitalized due to GERD, found to have hiatal hernia - Reports that she gains and loses weight - She wants to eat but doesn't have the appetite.  - Reports her GERD is better controlled with GI. - Has about 1 meal a day. - Her GI recommended following up with PCP for further management.    OBJECTIVE:   BP 122/78   Pulse 88   Ht 5' 2 (1.575 m)   Wt 186 lb 3.2 oz (84.5 kg)   SpO2 100%   BMI 34.06 kg/m   General: Alert, pleasant woman. NAD. HEENT: NCAT. MMM. CV: RRR, no murmurs.  Resp: CTAB, no wheezing or crackles. Normal WOB on RA.  Abm: Soft, nontender, nondistended. BS present. Ext: Moves all ext spontaneously Skin: Warm, well perfused   ASSESSMENT/PLAN:   Assessment & Plan Decreased appetite Differential includes GERD, gastroparesis, hypothyroidism, malignancy. Abm exam benign. CBC, metabolic panel, and CTAP unremarkable at 12/24 ED visit. Wt did decrease ~3-4lbs around September, when she was hospitalized, but has been stable since then.  Patient is worried about not getting enough nutrition. -Recommend keeping a food diary for 1 week to track foods, frequency of meals, calorie intake. -Referral to dietitian for optimization of nutrition    Tamara Nearing, MD [facility]

## 2023-09-29 ENCOUNTER — Other Ambulatory Visit: Payer: Self-pay | Admitting: Family Medicine

## 2023-09-29 DIAGNOSIS — I10 Essential (primary) hypertension: Secondary | ICD-10-CM

## 2023-09-30 MED ORDER — SPIRONOLACTONE 25 MG PO TABS: 50.0000 mg | ORAL_TABLET | Freq: Every day | ORAL | 3 refills | Status: DC

## 2023-09-30 NOTE — Addendum Note (Signed)
 Addended by: Veronda Prude on: 09/30/2023 11:51 AM   Modules accepted: Orders

## 2023-10-13 ENCOUNTER — Other Ambulatory Visit: Payer: Self-pay | Admitting: Gastroenterology

## 2023-10-13 DIAGNOSIS — K2100 Gastro-esophageal reflux disease with esophagitis, without bleeding: Secondary | ICD-10-CM

## 2023-10-13 DIAGNOSIS — R1013 Epigastric pain: Secondary | ICD-10-CM

## 2023-10-13 DIAGNOSIS — E876 Hypokalemia: Secondary | ICD-10-CM

## 2023-10-13 DIAGNOSIS — R110 Nausea: Secondary | ICD-10-CM

## 2023-10-13 DIAGNOSIS — K2970 Gastritis, unspecified, without bleeding: Secondary | ICD-10-CM

## 2023-10-14 ENCOUNTER — Emergency Department (HOSPITAL_COMMUNITY): Payer: Medicaid Other

## 2023-10-14 ENCOUNTER — Telehealth: Payer: Self-pay | Admitting: Gastroenterology

## 2023-10-14 ENCOUNTER — Other Ambulatory Visit: Payer: Self-pay

## 2023-10-14 ENCOUNTER — Emergency Department (HOSPITAL_COMMUNITY)
Admission: EM | Admit: 2023-10-14 | Discharge: 2023-10-14 | Disposition: A | Discharge status: Home / Self Care | Payer: Medicaid Other | Attending: Emergency Medicine | Admitting: Emergency Medicine

## 2023-10-14 ENCOUNTER — Encounter (HOSPITAL_COMMUNITY): Payer: Self-pay | Admitting: Emergency Medicine

## 2023-10-14 DIAGNOSIS — K2100 Gastro-esophageal reflux disease with esophagitis, without bleeding: Secondary | ICD-10-CM | POA: Diagnosis not present

## 2023-10-14 DIAGNOSIS — R0789 Other chest pain: Secondary | ICD-10-CM | POA: Diagnosis not present

## 2023-10-14 DIAGNOSIS — K449 Diaphragmatic hernia without obstruction or gangrene: Secondary | ICD-10-CM | POA: Diagnosis not present

## 2023-10-14 DIAGNOSIS — R1013 Epigastric pain: Secondary | ICD-10-CM | POA: Diagnosis present

## 2023-10-14 DIAGNOSIS — N281 Cyst of kidney, acquired: Secondary | ICD-10-CM | POA: Diagnosis not present

## 2023-10-14 LAB — CBC
HCT: 35.7 % — ABNORMAL LOW (ref 36.0–46.0)
Hemoglobin: 11.7 g/dL — ABNORMAL LOW (ref 12.0–15.0)
MCH: 29.6 pg (ref 26.0–34.0)
MCHC: 32.8 g/dL (ref 30.0–36.0)
MCV: 90.4 fL (ref 80.0–100.0)
Platelets: 248 10*3/uL (ref 150–400)
RBC: 3.95 MIL/uL (ref 3.87–5.11)
RDW: 13.1 % (ref 11.5–15.5)
WBC: 6.6 10*3/uL (ref 4.0–10.5)
nRBC: 0 % (ref 0.0–0.2)

## 2023-10-14 LAB — HCG, SERUM, QUALITATIVE: Preg, Serum: NEGATIVE

## 2023-10-14 LAB — COMPREHENSIVE METABOLIC PANEL
ALT: 17 U/L (ref 0–44)
AST: 16 U/L (ref 15–41)
Albumin: 4.1 g/dL (ref 3.5–5.0)
Alkaline Phosphatase: 48 U/L (ref 38–126)
Anion gap: 13 (ref 5–15)
BUN: 12 mg/dL (ref 6–20)
CO2: 23 mmol/L (ref 22–32)
Calcium: 9.5 mg/dL (ref 8.9–10.3)
Chloride: 103 mmol/L (ref 98–111)
Creatinine, Ser: 0.8 mg/dL (ref 0.44–1.00)
GFR, Estimated: >60 mL/min (ref >60)
Glucose, Bld: 98 mg/dL (ref 70–99)
Potassium: 3.3 mmol/L — ABNORMAL LOW (ref 3.5–5.1)
Sodium: 139 mmol/L (ref 135–145)
Total Bilirubin: 0.9 mg/dL (ref 0.0–1.2)
Total Protein: 8.4 g/dL — ABNORMAL HIGH (ref 6.5–8.1)

## 2023-10-14 LAB — LIPASE, BLOOD: Lipase: 28 U/L (ref 11–51)

## 2023-10-14 LAB — TROPONIN I (HIGH SENSITIVITY)
Troponin I (High Sensitivity): 3 ng/L (ref <18)
Troponin I (High Sensitivity): 3 ng/L (ref <18)

## 2023-10-14 MED ORDER — SODIUM CHLORIDE 0.9 % IV BOLUS
1000.0000 mL | Freq: Once | INTRAVENOUS | Status: AC
  Administered 2023-10-14: 1000 mL via INTRAVENOUS

## 2023-10-14 MED ORDER — PANTOPRAZOLE SODIUM 40 MG IV SOLR
40.0000 mg | Freq: Once | INTRAVENOUS | Status: AC
  Administered 2023-10-14: 40 mg via INTRAVENOUS
  Filled 2023-10-14: qty 10

## 2023-10-14 MED ORDER — HYDROMORPHONE HCL 1 MG/ML IJ SOLN: 1.0000 mg | Freq: Once | INTRAMUSCULAR | Status: DC

## 2023-10-14 MED ORDER — HYDROCODONE-ACETAMINOPHEN 5-325 MG PO TABS: 1.0000 | ORAL_TABLET | Freq: Four times a day (QID) | ORAL | 0 refills | Status: DC | PRN

## 2023-10-14 MED ORDER — IOHEXOL 300 MG/ML  SOLN
100.0000 mL | Freq: Once | INTRAMUSCULAR | Status: AC | PRN
Start: 2023-10-14 — End: 2023-10-14
  Administered 2023-10-14: 100 mL via INTRAVENOUS

## 2023-10-14 MED ORDER — PROCHLORPERAZINE MALEATE 10 MG PO TABS: 10.0000 mg | ORAL_TABLET | Freq: Three times a day (TID) | ORAL | 0 refills | Status: DC | PRN

## 2023-10-14 MED ORDER — HYDROCODONE-ACETAMINOPHEN 5-325 MG PO TABS
1.0000 | ORAL_TABLET | Freq: Once | ORAL | Status: AC
  Administered 2023-10-14: 1 via ORAL
  Filled 2023-10-14: qty 1

## 2023-10-14 MED ORDER — ONDANSETRON HCL 4 MG/2ML IJ SOLN
4.0000 mg | Freq: Once | INTRAMUSCULAR | Status: AC
  Administered 2023-10-14: 4 mg via INTRAVENOUS
  Filled 2023-10-14: qty 2

## 2023-10-14 MED ORDER — HYDROMORPHONE HCL 1 MG/ML IJ SOLN
1.0000 mg | Freq: Once | INTRAMUSCULAR | Status: AC
  Administered 2023-10-14: 1 mg via INTRAVENOUS
  Filled 2023-10-14: qty 1

## 2023-10-14 MED ORDER — PANTOPRAZOLE SODIUM 40 MG PO TBEC: 40.0000 mg | DELAYED_RELEASE_TABLET | Freq: Every day | ORAL | 1 refills | Status: DC

## 2023-10-14 NOTE — Telephone Encounter (Signed)
Returned call to patient. Pt reports that her last solid meal was on Sunday 1/19, that same day she had nausea and vomited once. Since then patient has lost about 7 lbs. Pt reports anytime she tries to eat solid foods she has nausea, lightheadedness, and dizziness. Patient states that she has been tolerating liquids. Pt initally thought that she may have had a virus, but she thinks that her hernia is causing recurrent symptoms. Patient has been seen in the ED late last year multiple times for the same symptoms. Patient has continued all medications as prescribed (Famotidine, Sucralfate, Phenergan, Zofran, and Compazine) with no relief. Patient states that it was previously mentioned that she may need to have hiatal hernia repaired. Patient is requesting referral at this time. Patient states that she is having symptoms daily and needs some relief. Please advise, thanks.

## 2023-10-14 NOTE — ED Triage Notes (Signed)
Patient presents due to abdominal pain, chest pain and nausea which started this past Sunday. She says the symptoms remind her of when she was in the hospital before due to her hiatal hernia.

## 2023-10-14 NOTE — Discharge Instructions (Signed)
Increase Carafate so you are taking it 4 times a day.  Start taking the Protonix that has been ordered and follow-up with your stomach doctor next week

## 2023-10-14 NOTE — Telephone Encounter (Signed)
Pt last saw Gunnar Fusi in the office

## 2023-10-14 NOTE — ED Provider Triage Note (Addendum)
Emergency Medicine Provider Triage Evaluation Note  KHRYSTYN MERRIOTT , a 48 y.o. female  was evaluated in triage.  Pt complains of right sided abdominal pain, left sided cp (pressure), nausea, decreased appetite since Sunday night.  Last BM earlier today was small but also hasn't been eating since Sunday. Took zofran and [name] at home without relief  Feels similar to when she had complications with hernia  Called GI who recommended ED evaluation based on symptoms  Review of Systems  Positive: right sided abdominal pain, left sided cp, nausea, decreased appetite Negative: fevers  Physical Exam  BP (!) 153/86 (BP Location: Left Arm)   Pulse (!) 104   Temp 98.6 F (37 C) (Oral)   Resp 18   SpO2 100%  Gen:   Awake, no distress   Resp:  Normal effort  MSK:   Moves extremities without difficulty  Other:  TTP of RUQ and RLQ  Medical Decision Making  Medically screening exam initiated at 3:37 PM.  Appropriate orders placed.  [name] was informed that the remainder of the evaluation will be completed by another provider, this initial triage assessment does not replace that evaluation, and the importance of remaining in the ED until their evaluation is complete.  Workup started   Judithann Sheen, PA 10/14/23 1545    Judithann Sheen, PA 10/14/23 [phone]

## 2023-10-14 NOTE — ED Provider Notes (Signed)
[place] EMERGENCY DEPARTMENT AT [facility] Provider Note   CSN: [id number] Arrival date & time: 10/14/23  1449     History  Chief Complaint  Patient presents with   Abdominal Pain   Nausea    Tamara Church is a 48 y.o. female.  Patient has a history of GERD.  She is complaining of abdominal pain.  This is similar to the pain she had when her GERD flared up.  The history is provided by the patient and medical records. No language interpreter was used.  Abdominal Pain Pain location:  Epigastric Pain quality: aching   Pain radiates to:  Does not radiate Pain severity:  Moderate Onset quality:  Sudden Timing:  Constant Progression:  Waxing and waning Chronicity:  Recurrent Context: not alcohol use   Relieved by:  Nothing Worsened by:  Nothing Ineffective treatments:  None tried Associated symptoms: no chest pain, no cough, no diarrhea, no fatigue and no hematuria        Home Medications Prior to Admission medications   Medication Sig Start Date End Date Taking? Authorizing Provider  HYDROcodone-acetaminophen (NORCO/VICODIN) 5-325 MG tablet Take 1 tablet by mouth every 6 (six) hours as needed for moderate pain (pain score 4-6). 10/14/23  Yes Bethann Berkshire, MD  pantoprazole (PROTONIX) 40 MG tablet Take 1 tablet (40 mg total) by mouth daily. 10/14/23  Yes Bethann Berkshire, MD  prochlorperazine (COMPAZINE) 10 MG tablet Take 1 tablet (10 mg total) by mouth every 8 (eight) hours as needed for nausea or vomiting. 10/14/23  Yes Bethann Berkshire, MD  amLODipine (NORVASC) 10 MG tablet Take 1 tablet by mouth once daily 08/10/23   Elberta Fortis, MD  famotidine (PEPCID) 20 MG tablet Take 1 tablet by mouth twice daily 09/16/23   Arnaldo Natal, NP  folic acid (FOLVITE) 1 MG tablet Take 1 tablet (1 mg total) by mouth daily. 10/13/23   Mansouraty, Netty Starring., MD  hydrOXYzine (ATARAX) 10 MG tablet Take 1 tablet (10 mg total) by mouth 2 (two) times daily. 07/21/23    Mansouraty, Netty Starring., MD  levonorgestrel (MIRENA) 20 MCG/24HR IUD 1 each by Intrauterine route once.    [provider]  loperamide (IMODIUM) 2 MG capsule Take 1 capsule (2 mg total) by mouth 4 (four) times daily as needed for diarrhea or loose stools. 09/14/23   Harris, Abigail, PA-C  ondansetron (ZOFRAN-ODT) 4 MG disintegrating tablet Take 1 tablet (4 mg total) by mouth every 8 (eight) hours as needed. 08/11/23   Meredith Pel, NP  prochlorperazine (COMPAZINE) 10 MG tablet Take 1 tablet (10 mg total) by mouth 2 (two) times daily as needed for nausea or vomiting. 09/14/23   Arthor Captain, PA-C  promethazine (PHENERGAN) 25 MG suppository Place 1 suppository (25 mg total) rectally every 6 (six) hours as needed for nausea or vomiting. 07/24/23   Coral Spikes, DO  spironolactone (ALDACTONE) 25 MG tablet Take 2 tablets (50 mg total) by mouth at bedtime. 09/30/23   Glendale Chard, DO  sucralfate (CARAFATE) 1 g tablet Take 1 tablet (1 g total) by mouth 2 (two) times daily. 08/11/23   Meredith Pel, NP      Allergies    Ace inhibitors, Angiotensin receptor blockers, and Hctz [hydrochlorothiazide]    Review of Systems   Review of Systems  Constitutional:  Negative for appetite change and fatigue.  HENT:  Negative for congestion, ear discharge and sinus pressure.   Eyes:  Negative for discharge.  Respiratory:  Negative for cough.   Cardiovascular:  Negative for chest pain.  Gastrointestinal:  Positive for abdominal pain. Negative for diarrhea.  Genitourinary:  Negative for frequency and hematuria.  Musculoskeletal:  Negative for back pain.  Skin:  Negative for rash.  Neurological:  Negative for seizures and headaches.  Psychiatric/Behavioral:  Negative for hallucinations.     Physical Exam Updated Vital Signs BP 139/86   Pulse 95   Temp 98.5 F (36.9 C) (Oral)   Resp 16   SpO2 99%  Physical Exam Vitals and nursing note reviewed.  Constitutional:      Appearance: She  is well-developed.  HENT:     Head: Normocephalic.     Nose: Nose normal.  Eyes:     General: No scleral icterus.    Conjunctiva/sclera: Conjunctivae normal.  Neck:     Thyroid: No thyromegaly.  Cardiovascular:     Rate and Rhythm: Normal rate and regular rhythm.     Heart sounds: No murmur heard.    No friction rub. No gallop.  Pulmonary:     Breath sounds: No stridor. No wheezing or rales.  Chest:     Chest wall: No tenderness.  Abdominal:     General: There is no distension.     Tenderness: There is abdominal tenderness. There is no rebound.  Musculoskeletal:        General: Normal range of motion.     Cervical back: Neck supple.  Lymphadenopathy:     Cervical: No cervical adenopathy.  Skin:    Findings: No erythema or rash.  Neurological:     Mental Status: She is alert and oriented to person, place, and time.     Motor: No abnormal muscle tone.     Coordination: Coordination normal.  Psychiatric:        Behavior: Behavior normal.     ED Results / Procedures / Treatments   Labs (all labs ordered are listed, but only abnormal results are displayed) Labs Reviewed  COMPREHENSIVE METABOLIC PANEL - Abnormal; Notable for the following components:      Result Value   Potassium 3.3 (*)    Total Protein 8.4 (*)    All other components within normal limits  CBC - Abnormal; Notable for the following components:   Hemoglobin 11.7 (*)    HCT 35.7 (*)    All other components within normal limits  LIPASE, BLOOD  HCG, SERUM, QUALITATIVE  TROPONIN I (HIGH SENSITIVITY)  TROPONIN I (HIGH SENSITIVITY)    EKG None  Radiology CT ABDOMEN PELVIS W CONTRAST Result Date: 10/14/2023 CLINICAL DATA:  Acute nonlocalized abdominal pain. Abdominal pain, chest pain, and nausea starting last Sunday. EXAM: CT ABDOMEN AND PELVIS WITH CONTRAST TECHNIQUE: Multidetector CT imaging of the abdomen and pelvis was performed using the standard protocol following bolus administration of  intravenous contrast. RADIATION DOSE REDUCTION: This exam was performed according to the departmental dose-optimization program which includes automated exposure control, adjustment of the mA and/or kV according to patient size and/or use of iterative reconstruction technique. CONTRAST:  OMNIPAQUE IOHEXOL 300 MG/ML  SOLN COMPARISON:  09/14/2023 FINDINGS: Lower chest: Lung bases are clear.  Small esophageal hiatal hernia. Hepatobiliary: Mild diffuse fatty infiltration of the liver. No focal lesions. Surgical absence of the gallbladder. No bile duct dilatation. Pancreas: Unremarkable. No pancreatic ductal dilatation or surrounding inflammatory changes. Spleen: Normal in size without focal abnormality. Adrenals/Urinary Tract: No adrenal gland nodules. Kidneys are symmetrical in size. Nephrograms are homogeneous. Several small cysts in the kidneys,  largest on the right measuring 1.4 cm diameter. No change since prior study. No imaging follow-up is indicated. No hydronephrosis or hydroureter. Bladder is normal. Stomach/Bowel: Stomach, small bowel, and colon are not abnormally distended. No wall thickening or inflammatory changes are appreciated. The appendix is not identified. Vascular/Lymphatic: No significant vascular findings are present. No enlarged abdominal or pelvic lymph nodes. Reproductive: Uterus and ovaries are not enlarged. An intrauterine device is present centrally in the uterus without change in position. Other: No free air or free fluid in the abdomen. Abdominal wall musculature appears intact. Musculoskeletal: No acute or significant osseous findings. IMPRESSION: 1. No acute process demonstrated in the abdomen or pelvis. No evidence of bowel obstruction or inflammation. 2. Mild diffuse fatty infiltration of the liver. 3. Small esophageal hiatal hernia. 4. Intrauterine device is present. Electronically Signed   By: Burman Nieves M.D.   On: 10/14/2023 18:29   DG Chest Portable 1 View Result  Date: 10/14/2023 CLINICAL DATA:  Left-sided chest pressure, nausea EXAM: PORTABLE CHEST 1 VIEW COMPARISON:  09/13/2024 FINDINGS: Single frontal view of the chest was obtained. Patient's jewelry obscures portions of the right apex and base of neck. Cardiac silhouette is unremarkable. No airspace disease, effusion, or pneumothorax. No acute bony abnormalities. IMPRESSION: 1. No acute intrathoracic process. Electronically Signed   By: Sharlet Salina M.D.   On: 10/14/2023 16:36    Procedures Procedures    Medications Ordered in ED Medications  HYDROmorphone (DILAUDID) injection 1 mg (has no administration in time range)  iohexol (OMNIPAQUE) 300 MG/ML solution 100 mL (100 mLs Intravenous Contrast Given 10/14/23 1813)  HYDROmorphone (DILAUDID) injection 1 mg (1 mg Intravenous Given 10/14/23 1932)  ondansetron (ZOFRAN) injection 4 mg (4 mg Intravenous Given 10/14/23 1932)  pantoprazole (PROTONIX) injection 40 mg (40 mg Intravenous Given 10/14/23 1932)  sodium chloride 0.9 % bolus 1,000 mL (0 mLs Intravenous Stopped 10/14/23 2049)    ED Course/ Medical Decision Making/ A&P                                 Medical Decision Making Amount and/or Complexity of Data Reviewed Labs: ordered.  Risk Prescription drug management.  This patient presents to the ED for concern of abdominal pain, this involves an extensive number of treatment options, and is a complaint that carries with it a high risk of complications and morbidity.  The differential diagnosis includes GERD, colitis   Co morbidities that complicate the patient evaluation  GERD   Additional history obtained:  Additional history obtained from patient External records from outside source obtained and reviewed including hospital records   Lab Tests:  I Ordered, and personally interpreted labs.  The pertinent results include: White count 6.6   Imaging Studies ordered:  I ordered imaging studies including CT abdomen I independently  visualized and interpreted imaging which showed hiatal hernia and fatty liver I agree with the radiologist interpretation   Cardiac Monitoring: / EKG:  The patient was maintained on a cardiac monitor.  I personally viewed and interpreted the cardiac monitored which showed an underlying rhythm of: Normal sinus rhythm   Consultations Obtained:  No consultant Problem List / ED Course / Critical interventions / Medication management  Abdominal pain and GERD I ordered medication including Protonix Reevaluation of the patient after these medicines showed that the patient improved I have reviewed the patients home medicines and have made adjustments as needed   Social Determinants of  Health:  None   Test / Admission - Considered:  None  Abdominal pain.  Most likely her GERD getting worse.  She will start Protonix once a day and we will increase the Carafate to 4 times a day.  She is given pain medicine and nausea medicine and will follow-up with GI        Final Clinical Impression(s) / ED Diagnoses Final diagnoses:  Gastroesophageal reflux disease with esophagitis without hemorrhage    Rx / DC Orders ED Discharge Orders          Ordered    pantoprazole (PROTONIX) 40 MG tablet  Daily        10/14/23 2104    HYDROcodone-acetaminophen (NORCO/VICODIN) 5-325 MG tablet  Every 6 hours PRN        10/14/23 2104    prochlorperazine (COMPAZINE) 10 MG tablet  Every 8 hours PRN        10/14/23 2104              Bethann Berkshire, MD 10/18/23 1201

## 2023-10-14 NOTE — Telephone Encounter (Signed)
Inbound call from patient stating she has been feeling sick since Sunday 1/19. States every time she tries to eat she feels veery nauseous, lightheaded, and dizzy. Patient is requesting a call to discuss options to help. Please advise, thank you.

## 2023-10-15 NOTE — Telephone Encounter (Signed)
Inbound call from patient stating she was at the emergency department last night 1/23. States she advised she has a hiatal hernia. Requesting a call back to discuss next steps. Please advise, thank you.

## 2023-10-16 ENCOUNTER — Other Ambulatory Visit: Payer: Self-pay

## 2023-10-16 ENCOUNTER — Other Ambulatory Visit: Payer: Self-pay | Admitting: Nurse Practitioner

## 2023-10-16 ENCOUNTER — Emergency Department (HOSPITAL_COMMUNITY)
Admission: EM | Admit: 2023-10-16 | Discharge: 2023-10-17 | Disposition: A | Discharge status: Home / Self Care | Payer: Medicaid Other | Attending: Emergency Medicine | Admitting: Emergency Medicine

## 2023-10-16 ENCOUNTER — Encounter (HOSPITAL_COMMUNITY): Payer: Self-pay

## 2023-10-16 ENCOUNTER — Emergency Department (HOSPITAL_COMMUNITY): Payer: Medicaid Other

## 2023-10-16 DIAGNOSIS — R1084 Generalized abdominal pain: Secondary | ICD-10-CM | POA: Diagnosis not present

## 2023-10-16 DIAGNOSIS — R109 Unspecified abdominal pain: Secondary | ICD-10-CM | POA: Diagnosis present

## 2023-10-16 DIAGNOSIS — R1013 Epigastric pain: Secondary | ICD-10-CM | POA: Diagnosis not present

## 2023-10-16 DIAGNOSIS — R10819 Abdominal tenderness, unspecified site: Secondary | ICD-10-CM | POA: Insufficient documentation

## 2023-10-16 DIAGNOSIS — R0789 Other chest pain: Secondary | ICD-10-CM | POA: Diagnosis not present

## 2023-10-16 LAB — CBC WITH DIFFERENTIAL/PLATELET
Abs Immature Granulocytes: 0.01 10*3/uL (ref 0.00–0.07)
Basophils Absolute: 0 10*3/uL (ref 0.0–0.1)
Basophils Relative: 1 %
Eosinophils Absolute: 0 10*3/uL (ref 0.0–0.5)
Eosinophils Relative: 0 %
HCT: 36.7 % (ref 36.0–46.0)
Hemoglobin: 12 g/dL (ref 12.0–15.0)
Immature Granulocytes: 0 %
Lymphocytes Relative: 23 %
Lymphs Abs: 1.5 10*3/uL (ref 0.7–4.0)
MCH: 29.3 pg (ref 26.0–34.0)
MCHC: 32.7 g/dL (ref 30.0–36.0)
MCV: 89.5 fL (ref 80.0–100.0)
Monocytes Absolute: 0.4 10*3/uL (ref 0.1–1.0)
Monocytes Relative: 6 %
Neutro Abs: 4.5 10*3/uL (ref 1.7–7.7)
Neutrophils Relative %: 70 %
Platelets: 243 10*3/uL (ref 150–400)
RBC: 4.1 MIL/uL (ref 3.87–5.11)
RDW: 13.1 % (ref 11.5–15.5)
WBC: 6.4 10*3/uL (ref 4.0–10.5)
nRBC: 0 % (ref 0.0–0.2)

## 2023-10-16 LAB — LIPASE, BLOOD: Lipase: 32 U/L (ref 11–51)

## 2023-10-16 LAB — COMPREHENSIVE METABOLIC PANEL
ALT: 22 U/L (ref 0–44)
AST: 17 U/L (ref 15–41)
Albumin: 4.4 g/dL (ref 3.5–5.0)
Alkaline Phosphatase: 51 U/L (ref 38–126)
Anion gap: 14 (ref 5–15)
BUN: 7 mg/dL (ref 6–20)
CO2: 22 mmol/L (ref 22–32)
Calcium: 9.6 mg/dL (ref 8.9–10.3)
Chloride: 99 mmol/L (ref 98–111)
Creatinine, Ser: 0.69 mg/dL (ref 0.44–1.00)
GFR, Estimated: >60 mL/min (ref >60)
Glucose, Bld: 106 mg/dL — ABNORMAL HIGH (ref 70–99)
Potassium: 3.1 mmol/L — ABNORMAL LOW (ref 3.5–5.1)
Sodium: 135 mmol/L (ref 135–145)
Total Bilirubin: 1.1 mg/dL (ref 0.0–1.2)
Total Protein: 8.9 g/dL — ABNORMAL HIGH (ref 6.5–8.1)

## 2023-10-16 LAB — HCG, SERUM, QUALITATIVE: Preg, Serum: NEGATIVE

## 2023-10-16 MED ORDER — ONDANSETRON HCL 4 MG/2ML IJ SOLN
4.0000 mg | Freq: Once | INTRAMUSCULAR | Status: AC
  Administered 2023-10-16: 4 mg via INTRAVENOUS
  Filled 2023-10-16: qty 2

## 2023-10-16 MED ORDER — FENTANYL CITRATE PF 50 MCG/ML IJ SOSY
25.0000 ug | PREFILLED_SYRINGE | Freq: Once | INTRAMUSCULAR | Status: DC
  Filled 2023-10-16: qty 1

## 2023-10-16 MED ORDER — SODIUM CHLORIDE 0.9 % IV BOLUS
1000.0000 mL | Freq: Once | INTRAVENOUS | Status: AC
  Administered 2023-10-16: 1000 mL via INTRAVENOUS

## 2023-10-16 MED ORDER — ALUM & MAG HYDROXIDE-SIMETH 200-200-20 MG/5ML PO SUSP
30.0000 mL | Freq: Once | ORAL | Status: AC
  Administered 2023-10-16: 30 mL via ORAL
  Filled 2023-10-16: qty 30

## 2023-10-16 MED ORDER — DICYCLOMINE HCL 10 MG/5ML PO SOLN
10.0000 mg | Freq: Once | ORAL | Status: AC
  Administered 2023-10-16: 10 mg via ORAL
  Filled 2023-10-16: qty 5

## 2023-10-16 NOTE — ED Notes (Signed)
ED Provider at bedside.

## 2023-10-16 NOTE — ED Triage Notes (Signed)
Pt reports with abdominal pain and nausea. Pt has a hernia and states that she is unable to keep anything down.

## 2023-10-16 NOTE — ED Provider Notes (Signed)
[place] EMERGENCY DEPARTMENT AT [facility] Provider Note   CSN: [id number] Arrival date & time: 10/16/23  1721     History {Add pertinent medical, surgical, social history, OB history to HPI:1} Chief Complaint  Patient presents with   Abdominal Pain    Tamara Church is a 48 y.o. female.  HPI     Home Medications Prior to Admission medications   Medication Sig Start Date End Date Taking? Authorizing Provider  amLODipine (NORVASC) 10 MG tablet Take 1 tablet by mouth once daily 08/10/23   Elberta Fortis, MD  famotidine (PEPCID) 20 MG tablet Take 1 tablet by mouth twice daily 09/16/23   Arnaldo Natal, NP  folic acid (FOLVITE) 1 MG tablet Take 1 tablet (1 mg total) by mouth daily. 10/13/23   Mansouraty, Netty Starring., MD  HYDROcodone-acetaminophen (NORCO/VICODIN) 5-325 MG tablet Take 1 tablet by mouth every 6 (six) hours as needed for moderate pain (pain score 4-6). 10/14/23   Bethann Berkshire, MD  hydrOXYzine (ATARAX) 10 MG tablet Take 1 tablet (10 mg total) by mouth 2 (two) times daily. 07/21/23   Mansouraty, Netty Starring., MD  levonorgestrel (MIRENA) 20 MCG/24HR IUD 1 each by Intrauterine route once.    [provider]  loperamide (IMODIUM) 2 MG capsule Take 1 capsule (2 mg total) by mouth 4 (four) times daily as needed for diarrhea or loose stools. 09/14/23   Harris, Abigail, PA-C  ondansetron (ZOFRAN-ODT) 4 MG disintegrating tablet Take 1 tablet (4 mg total) by mouth every 8 (eight) hours as needed. 08/11/23   Meredith Pel, NP  pantoprazole (PROTONIX) 40 MG tablet Take 1 tablet (40 mg total) by mouth daily. 10/14/23   Bethann Berkshire, MD  prochlorperazine (COMPAZINE) 10 MG tablet Take 1 tablet (10 mg total) by mouth 2 (two) times daily as needed for nausea or vomiting. 09/14/23   Arthor Captain, PA-C  prochlorperazine (COMPAZINE) 10 MG tablet Take 1 tablet (10 mg total) by mouth every 8 (eight) hours as needed for nausea or vomiting. 10/14/23    Bethann Berkshire, MD  promethazine (PHENERGAN) 25 MG suppository Place 1 suppository (25 mg total) rectally every 6 (six) hours as needed for nausea or vomiting. 07/24/23   Coral Spikes, DO  spironolactone (ALDACTONE) 25 MG tablet Take 2 tablets (50 mg total) by mouth at bedtime. 09/30/23   Glendale Chard, DO  sucralfate (CARAFATE) 1 g tablet Take 1 tablet (1 g total) by mouth 2 (two) times daily. 08/11/23   Meredith Pel, NP      Allergies    Ace inhibitors, Angiotensin receptor blockers, and Hctz [hydrochlorothiazide]    Review of Systems   Review of Systems  Physical Exam Updated Vital Signs BP (!) 147/100   Pulse 87   Temp 98.8 F (37.1 C) (Oral)   Resp 17   Ht 5\' 2"  (1.575 m)   Wt 83.9 kg   SpO2 100%   BMI 33.84 kg/m  Physical Exam  ED Results / Procedures / Treatments   Labs (all labs ordered are listed, but only abnormal results are displayed) Labs Reviewed  COMPREHENSIVE METABOLIC PANEL - Abnormal; Notable for the following components:      Result Value   Potassium 3.1 (*)    Glucose, Bld 106 (*)    Total Protein 8.9 (*)    All other components within normal limits  LIPASE, BLOOD  CBC WITH DIFFERENTIAL/PLATELET  HCG, SERUM, QUALITATIVE  URINALYSIS, ROUTINE W REFLEX MICROSCOPIC    EKG None  Radiology No results found.  Procedures Procedures  {Document cardiac monitor, telemetry assessment procedure when appropriate:1}  Medications Ordered in ED Medications - No data to display  ED Course/ Medical Decision Making/ A&P   {   Click here for ABCD2, HEART and other calculatorsREFRESH Note before signing :1}                              Medical Decision Making Amount and/or Complexity of Data Reviewed Labs: ordered.   ***  {Document critical care time when appropriate:1} {Document review of labs and clinical decision tools ie heart score, Chads2Vasc2 etc:1}  {Document your independent review of radiology images, and any outside  records:1} {Document your discussion with family members, caretakers, and with consultants:1} {Document social determinants of health affecting pt's care:1} {Document your decision making why or why not admission, treatments were needed:1} Final Clinical Impression(s) / ED Diagnoses Final diagnoses:  None    Rx / DC Orders ED Discharge Orders     None

## 2023-10-17 NOTE — Discharge Instructions (Signed)
Please follow up with your gastroenterologist and continue using the previously prescribed medications for your gastritis. Return to the ER with any new severe symptom.

## 2023-10-19 NOTE — Telephone Encounter (Signed)
A copy of schedule has been printed and placed in your office IN box. Availability is subject to change.

## 2023-10-19 NOTE — Telephone Encounter (Signed)
Tamara Church, please see upcoming availability in the [facility].

## 2023-10-27 ENCOUNTER — Telehealth: Payer: Self-pay

## 2023-10-27 DIAGNOSIS — K2100 Gastro-esophageal reflux disease with esophagitis, without bleeding: Secondary | ICD-10-CM

## 2023-10-27 DIAGNOSIS — R112 Nausea with vomiting, unspecified: Secondary | ICD-10-CM

## 2023-10-27 DIAGNOSIS — R1013 Epigastric pain: Secondary | ICD-10-CM

## 2023-10-27 NOTE — Telephone Encounter (Signed)
-----   Message from Vina Dasen sent at 10/27/2023 11:41 AM EST ----- Regarding: RE: Availability for EGD for bravo Yes, off treatment. Thanks for helping out.   Chasten Blaze, can you proceed with getting this scheduled OFF PPI / H2 blockers.   Thanks ----- Message ----- From: Federico Rosario BROCKS, MD Sent: 10/26/2023   6:43 PM EST To: Vina CHRISTELLA Dasen, NP; Gustav Shila GAILS, MD Subject: RE: Availability for EGD for bravo             Sure, would be happy to do an EGD with BRAVO. I am assuming that Gabe would prefer to have this done off of PPI and H2 blocker therapy, correct? ----- Message ----- From: Dasen Vina CHRISTELLA, NP Sent: 10/26/2023   4:06 PM EST To: Kavitha Nandigam V, MD; Ying C Dorsey, MD Subject: Availability for EGD for bravo                 Hi,  These is a patient of GM's with chronic upper abdominal pain / chest pain . Overall extensive evaluation has been unrevealing. Most recently had a ph/ mano done - showed nonspecific esophageal dysmotility. She couldn't tolerate ph probe. GM really wanted to see if / how much reflux was contributing to her symptoms. He mentioned that someone could do an EGD with bravo placement while he was out on leave.   Both of you have availability in Feb if one of you agree. I assume Bravo can be placed in [facility]?    In case you are wondering, GM's next avail time is 3/18.  If able to help please let me know  Thanks

## 2023-10-27 NOTE — Telephone Encounter (Signed)
 Patient has been scheduled for EGD BRAVO in the [facility] on 11/18/23 at 3 pm (2 pm arrival) with Dr. Federico. Pt will need to hold Sucralfate , Protonix , and Pepcid  prior to EGD BRAVO.   OV on 11/19/23 with GM will likely need to be rescheduled.   Ambulatory referral to GI in epic.   EGD BRAVO instructions printed, will mail to patient once she confirms that appt is OK.

## 2023-10-29 NOTE — Telephone Encounter (Signed)
 Lm on vm for patient to return call

## 2023-11-01 NOTE — Telephone Encounter (Signed)
 Pt returned call. Pt states that we were calling the incorrect phone number, her number is now updated in the system. We reviewed the plan for Bravo EGD on 11/18/23 at 3 pm, arriving at 2 pm with a care partner. Pt knows that she will receive instructions in the mail. I advised pt to stop Protonix , Pepcid , and Sucralfate  starting 11/11/23, pt is aware that she can take Tums up to 12 hours prior to her procedure. Pt has been advised to keep OV as scheduled with GM on 11/19/23 for further evaluation as well. Pt verbalized understanding and had no concerns at the end of the call.

## 2023-11-01 NOTE — Telephone Encounter (Signed)
 Lm on vm for patient to return call.   Per Maureen Sour - keep OV as scheduled with GM on 11/19/23.

## 2023-11-04 ENCOUNTER — Other Ambulatory Visit: Payer: Self-pay | Admitting: Family Medicine

## 2023-11-06 IMAGING — CR DG SHOULDER 2+V*R*
3 series · 3 of 3 positions shown · non-contrast
Comparison: None.

CLINICAL DATA: Right arm pain right shoulder/arm pain, possible
acromioclavicular joint osteoarthritis. No known injury.

EXAM:
RIGHT SHOULDER - 2+ VIEW

[w shoulder grashey right]
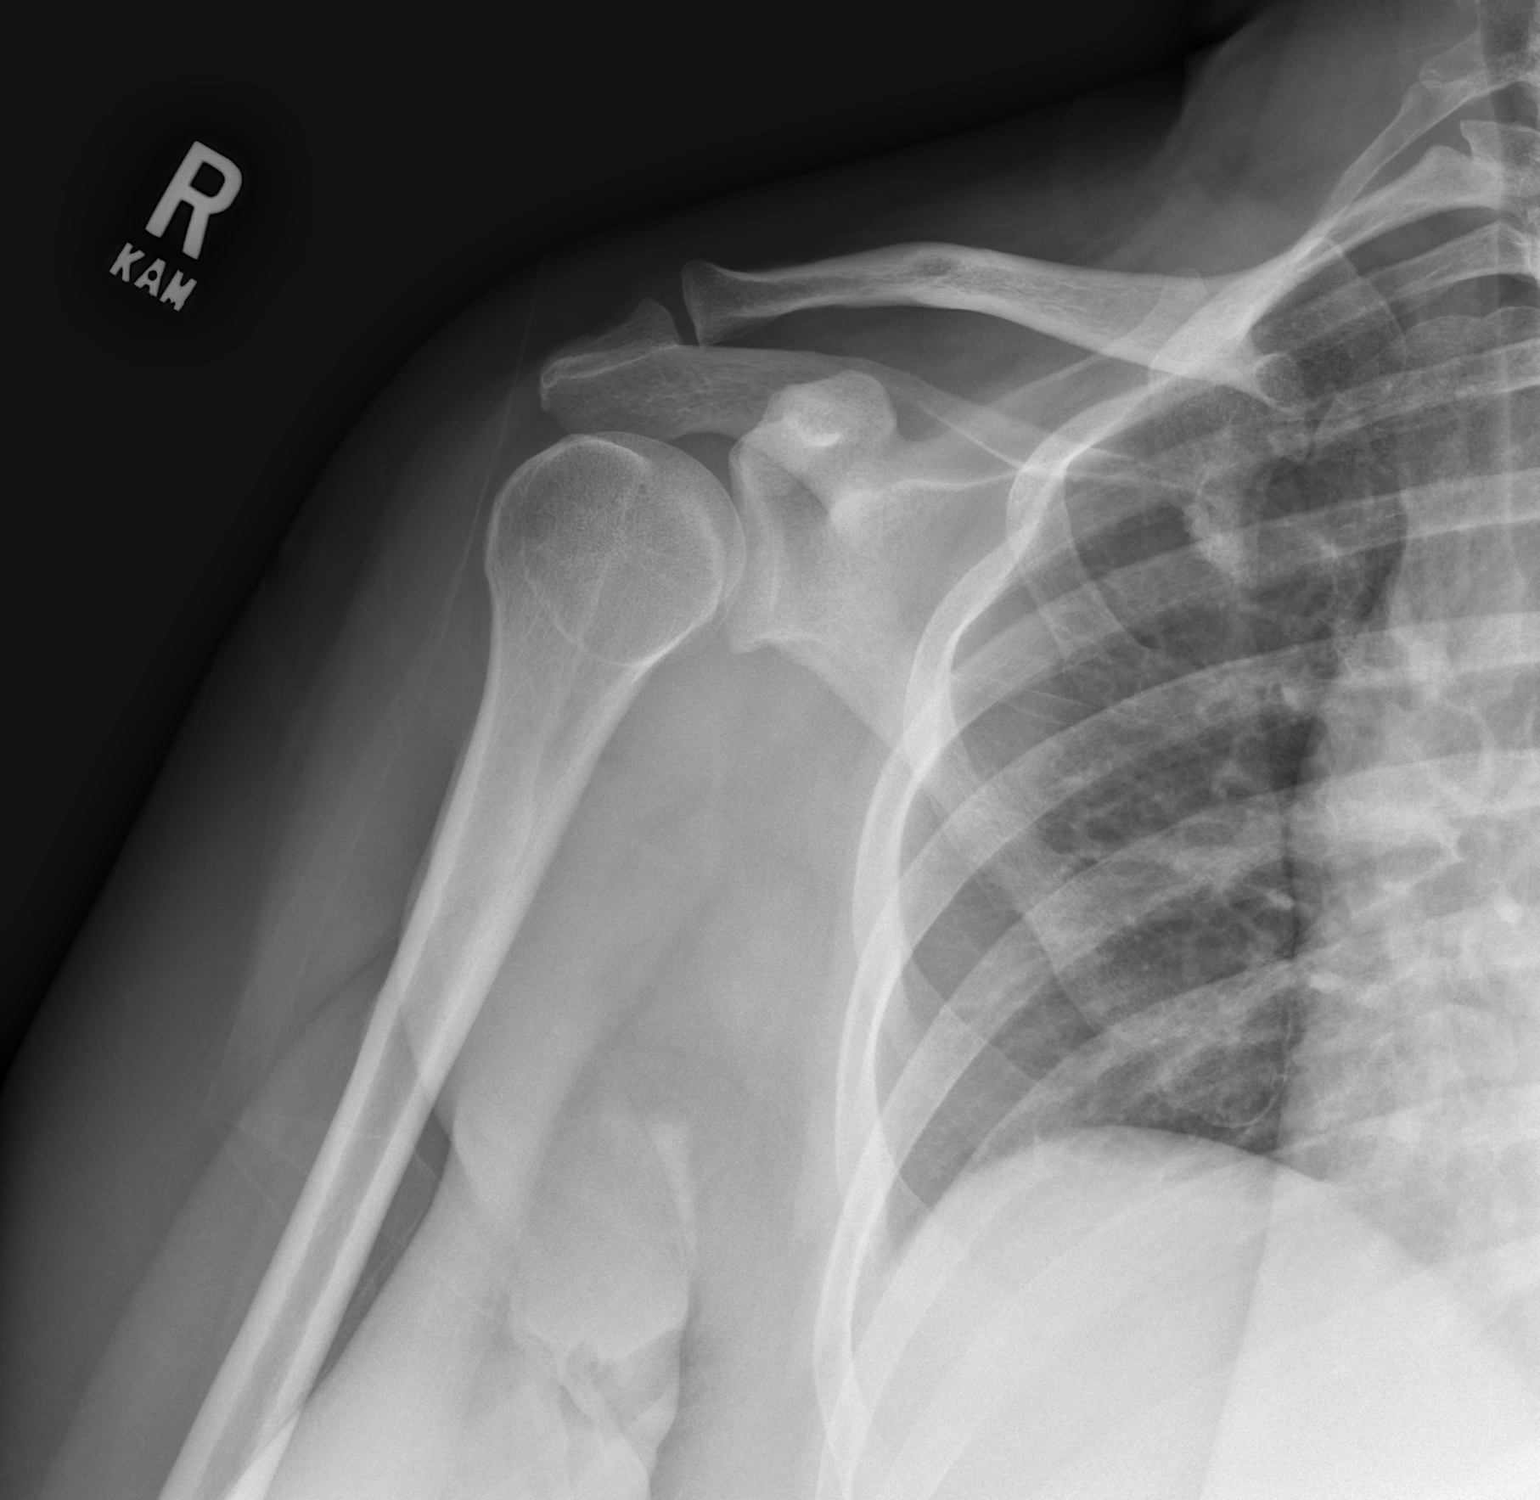

[w shoulder y-view right]
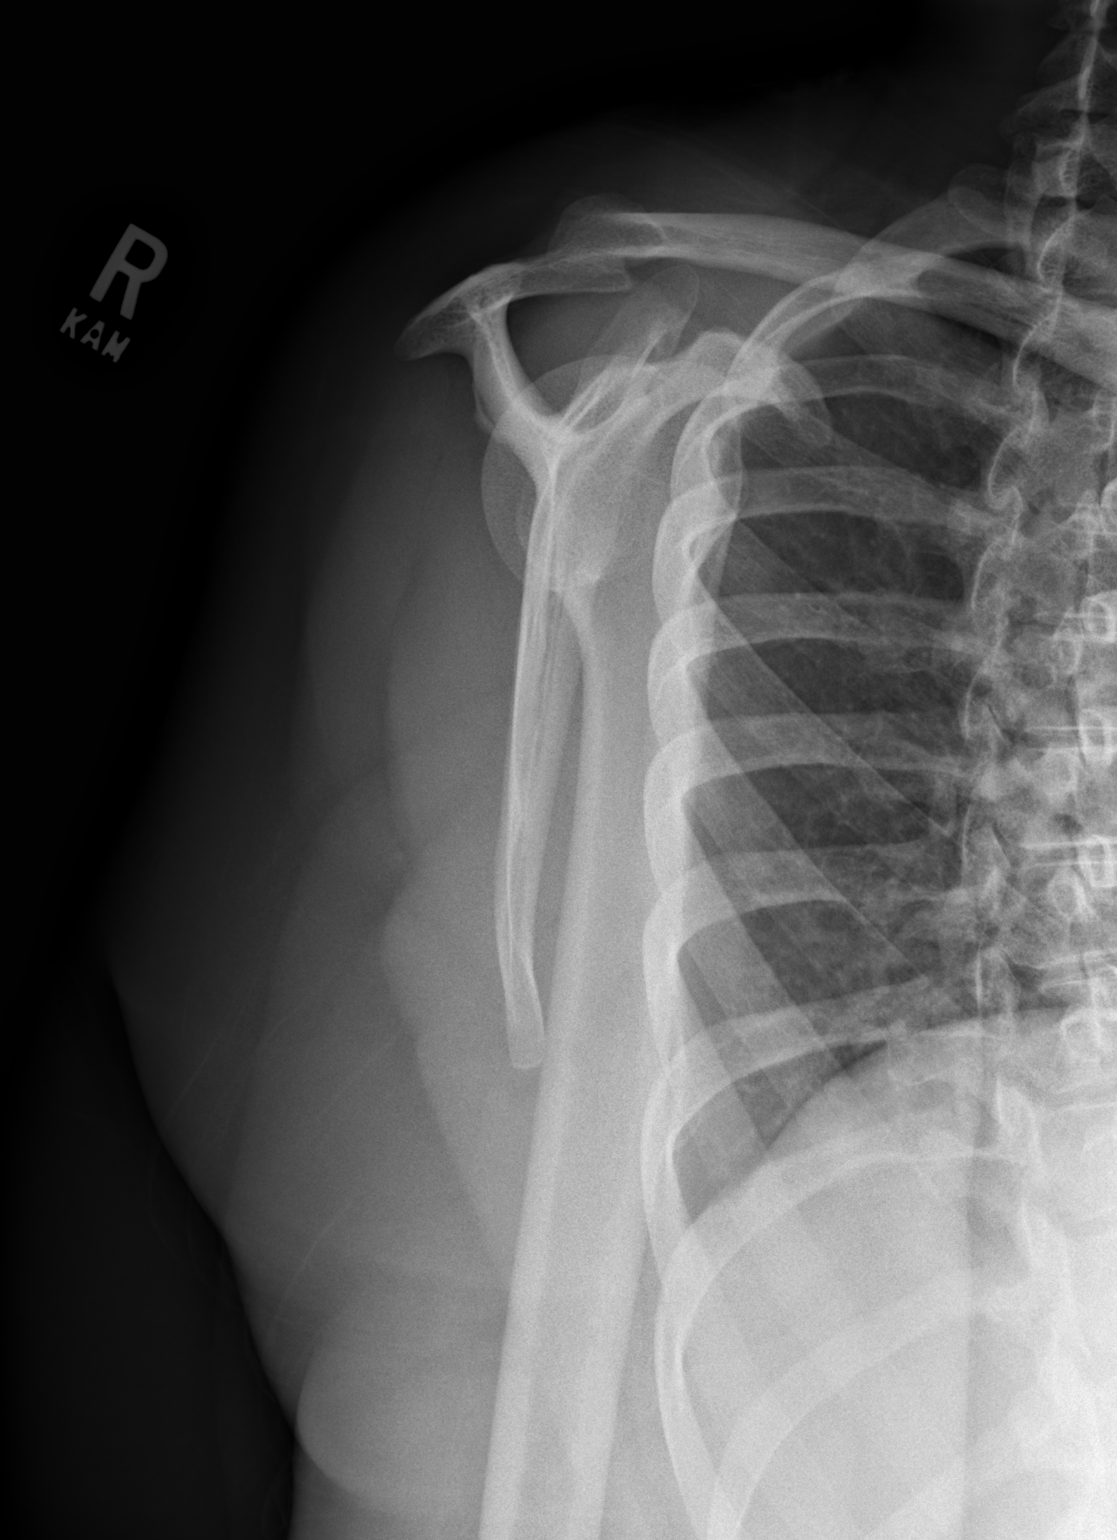

[w shoulder axillary right]
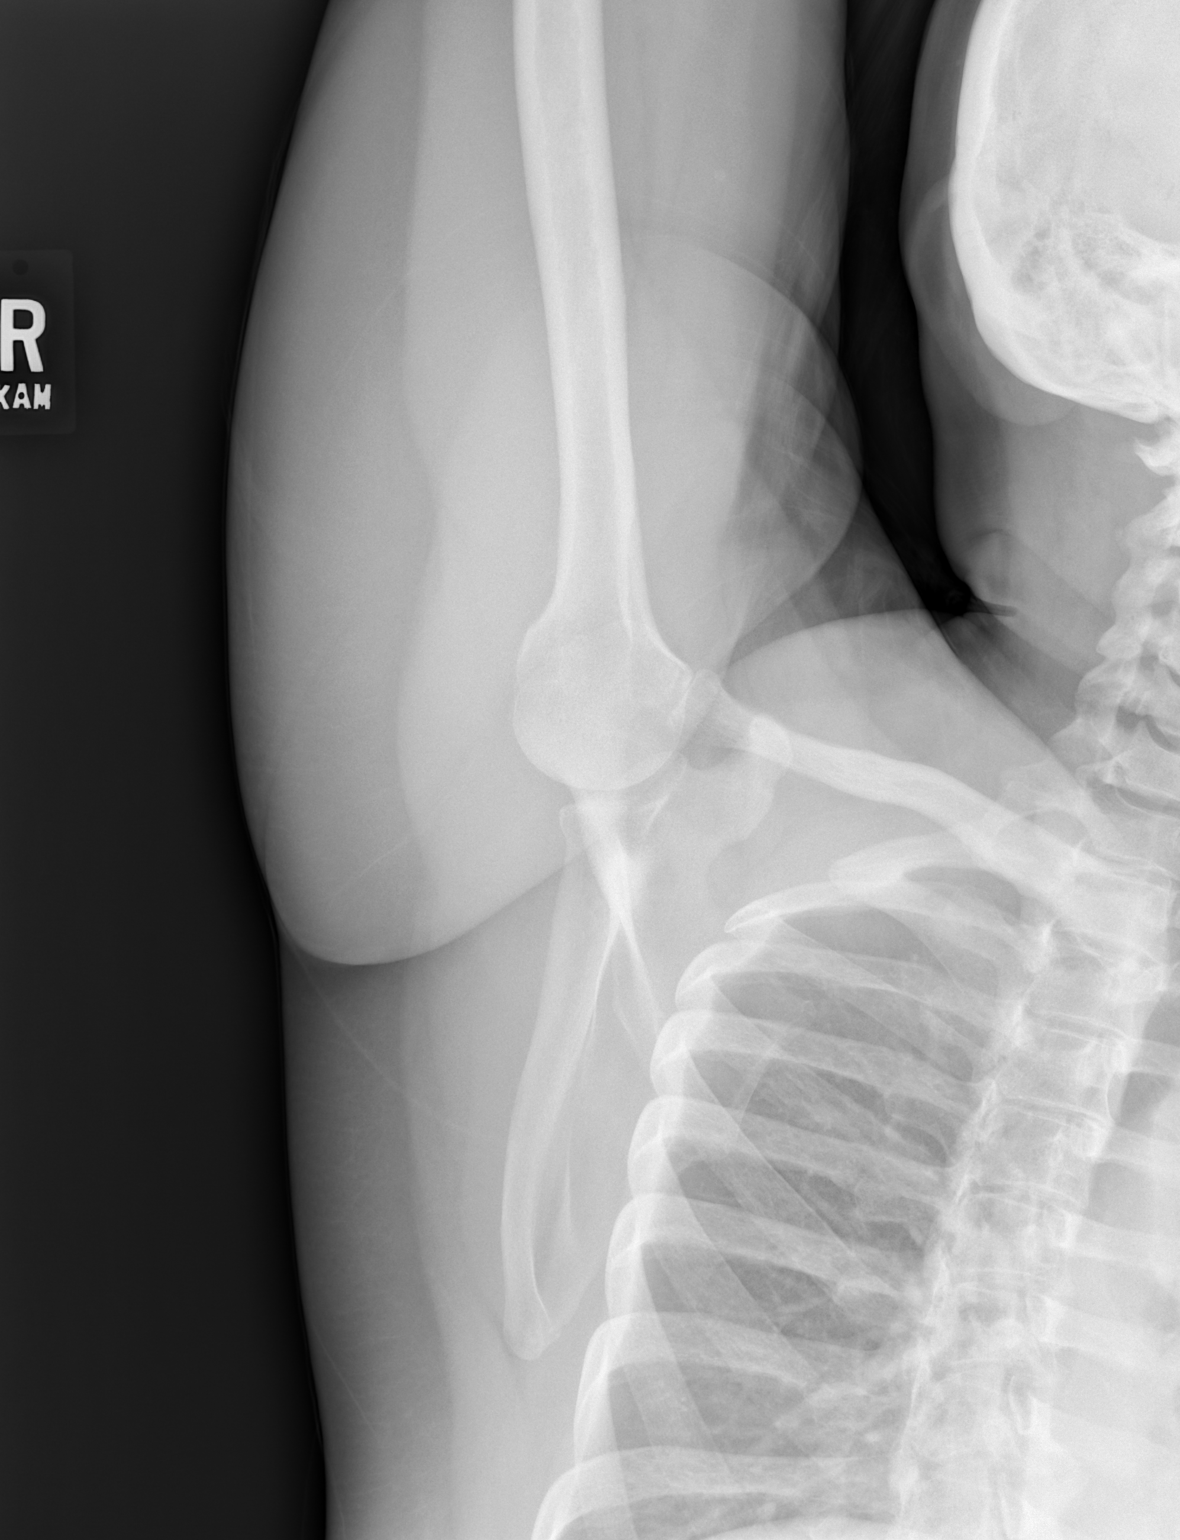

[3 of 3 positions shown; findings below may reference images not displayed]

FINDINGS: Minimal degenerative spurring of the acromioclavicular joint. No AC
joint widening. Tiny subacromial spur. Normal glenohumeral joint. No
fracture. Normal alignment. No fracture, erosion, avascular necrosis
or focal bone abnormality. No soft tissue calcifications.
IMPRESSION: Minimal degenerative spurring of the acromioclavicular joint. Tiny
subacromial spur.

## 2023-11-06 IMAGING — CR DG CERVICAL SPINE COMPLETE 4+V
6 series · 6 of 6 positions shown · non-contrast
Comparison: Cervical spine radiograph 04/15/2017

CLINICAL DATA: Right arm pain. Radiating shoulder pain down right
arm.

EXAM:
CERVICAL SPINE - COMPLETE 4+ VIEW

[w cervical spine lat]
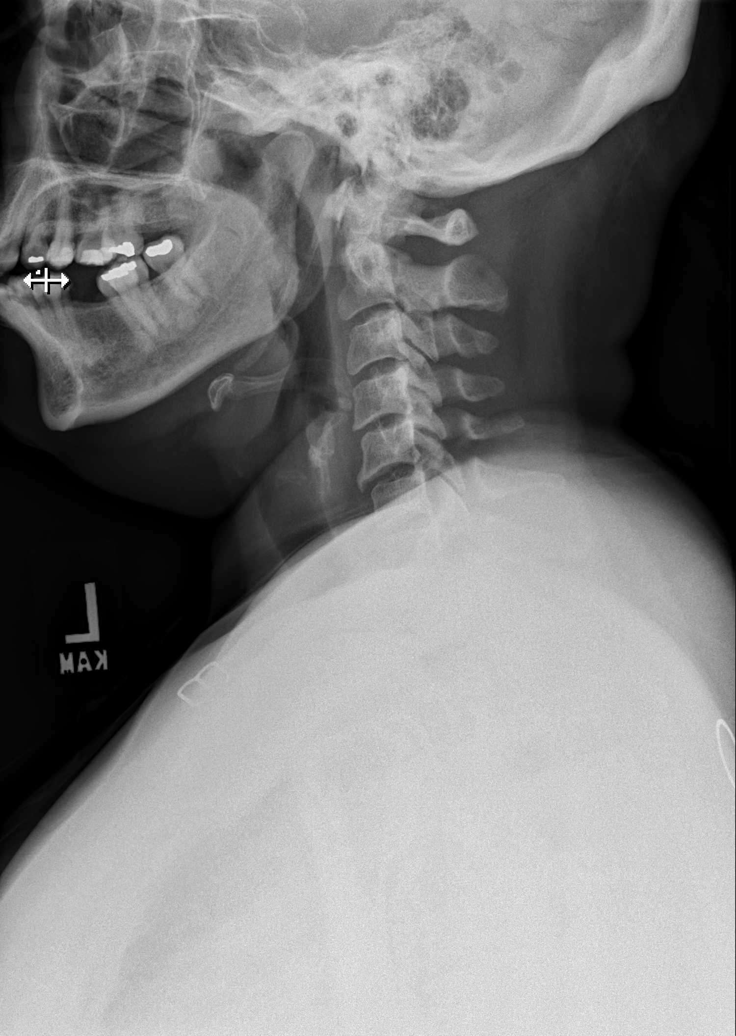

[w cervical spine ap_obl (1 of 2)]
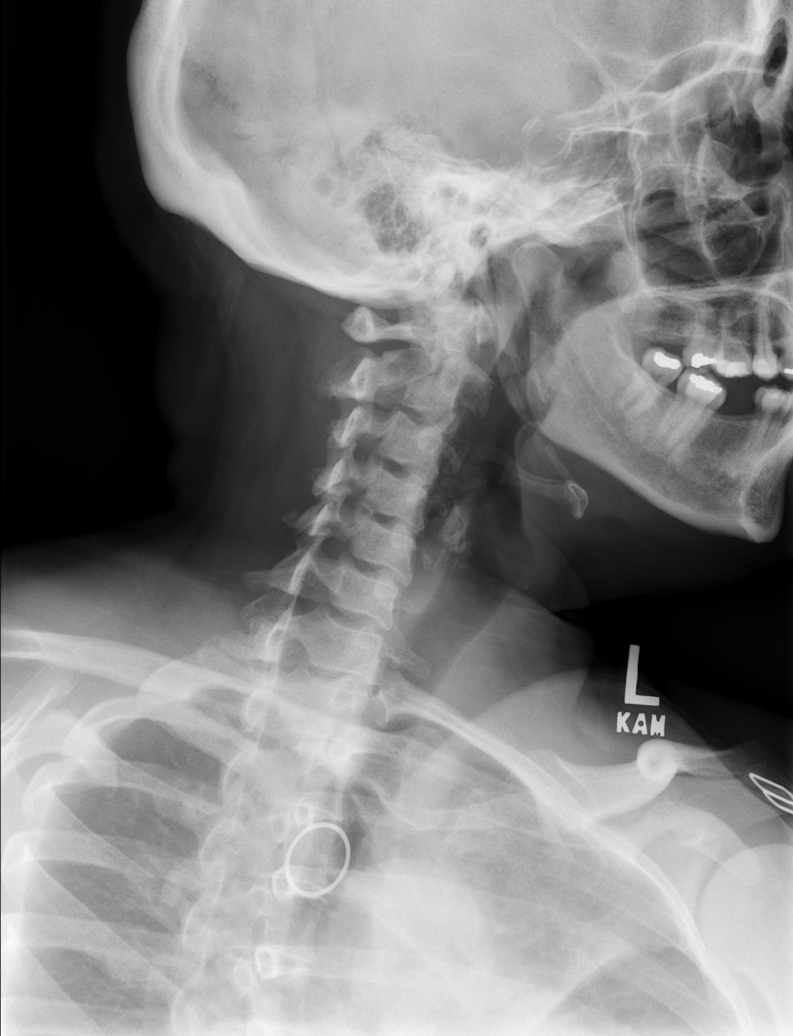

[w cervical spine ap_obl (2 of 2)]
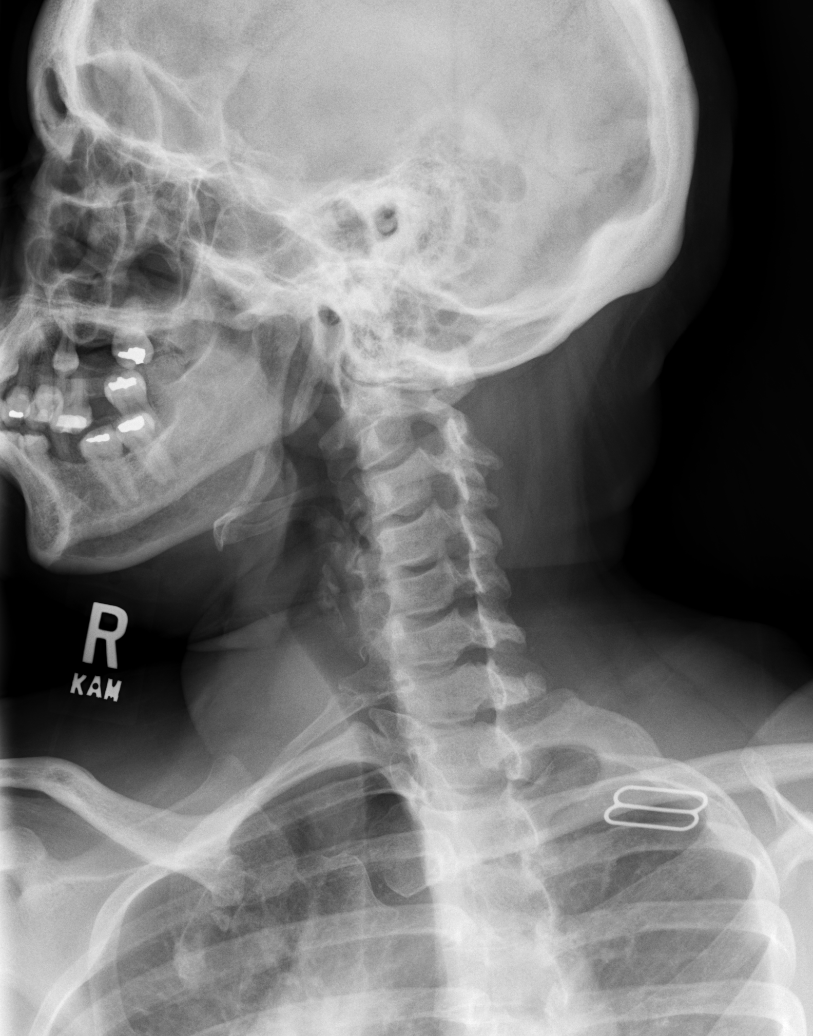

[w cervical spine ap]
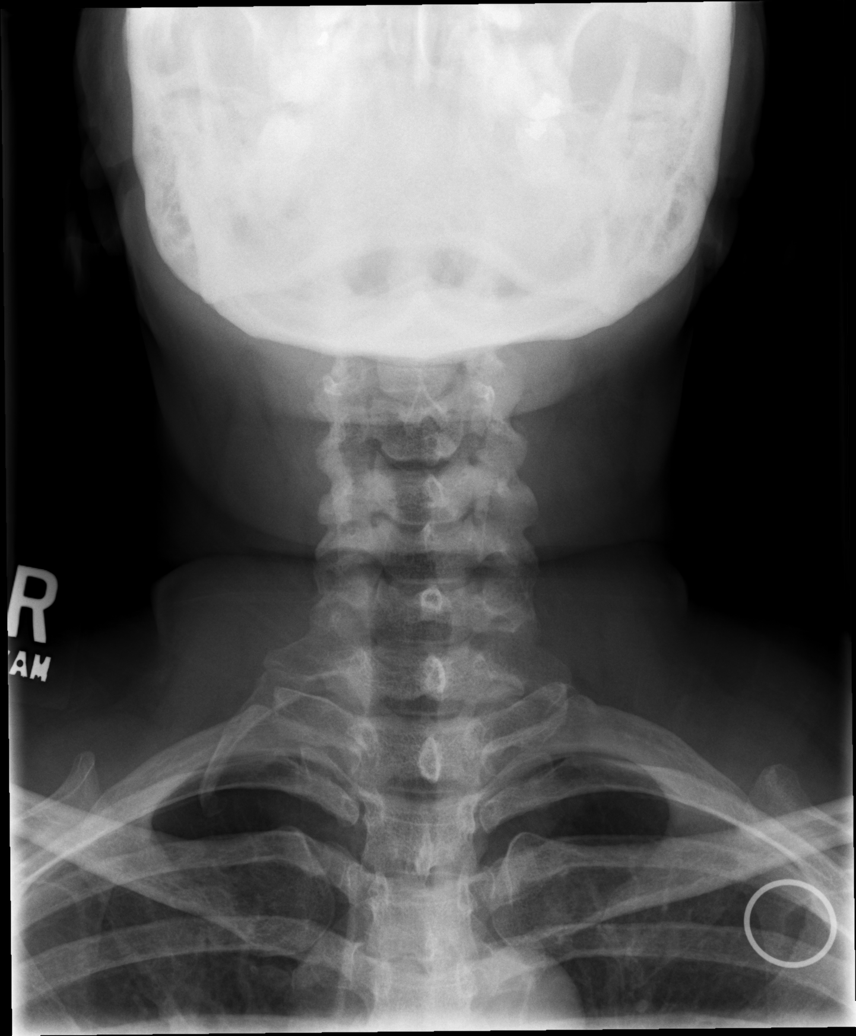

[w cervical spine odontoid]
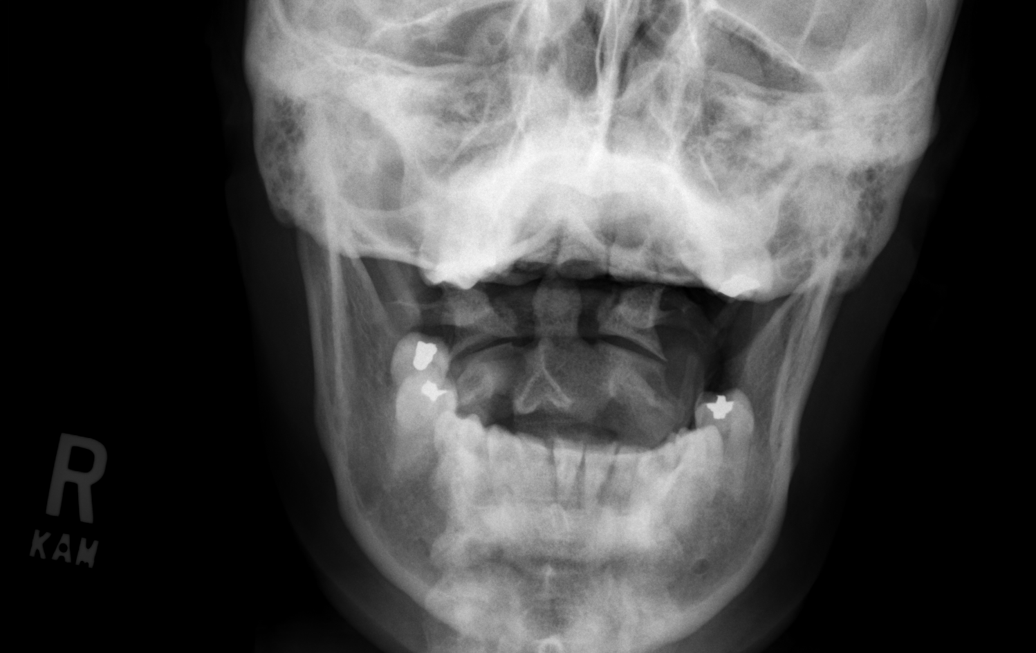

[w cervical swimmers]
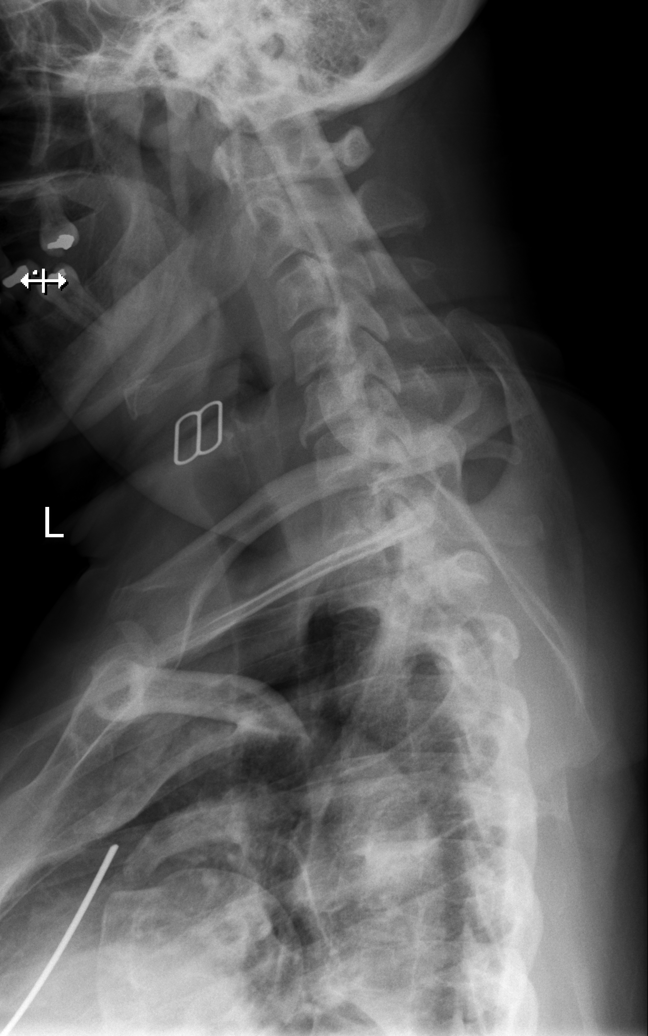

[6 of 6 positions shown; findings below may reference images not displayed]

FINDINGS: Straightening of normal lordosis, similar to prior exam. Normal
vertebral body heights. Disc space narrowing and endplate spurring
at C4-C5 and C5-C6, slight progression from 8396 exam. Minor facet
hypertrophy, more prominent on the right at C4-C5. No bony neural
foraminal narrowing. No evidence of fracture, bony destruction or
focal bone lesion. There is a small right cervical rib. No
prevertebral soft tissue thickening.
IMPRESSION: 1. Slight progression in degenerative disc disease at C4-C5 and
C5-C6 since [DATE]. Mild right-sided facet hypertrophy, most prominent at C4-C5.
3. Small right cervical rib.

## 2023-11-08 ENCOUNTER — Telehealth: Payer: Self-pay | Admitting: Nurse Practitioner

## 2023-11-08 NOTE — Telephone Encounter (Signed)
 Called and spoke with patient. Pt reports that every time she takes Carafate she experiences nausea, vomiting, and dizziness. Pt has been taking 1 g BID, she tried decreasing to once a day several weeks ago and still experienced the same symptoms. Pt states that it "makes her stomach feel sour". I advised pt to stop Carafate at this time. Pt is seeking recommendations for alternative med. I told pt that her Bravo EGD is scheduled  for next week and she will have to hold all acid reducing medications starting on 11/11/23 and Gunnar Fusi may wait for results of procedure before advising on alternative. Patient is aware that she can take Tums in the interim, up until 12 hours prior to her procedure. Please advise, thanks.

## 2023-11-08 NOTE — Telephone Encounter (Signed)
 Patient called and stated that she was taking Carafate 1 G and she was experiencing Nausea, vomiting and dizziness. Patient also stated that since she has been taking Carafate she has noticed a extreme amount of weight loss. Patient is requesting a call back. Please advise.

## 2023-11-09 MED ORDER — CHOLESTYRAMINE 4 G PO PACK: 4.0000 g | PACK | Freq: Two times a day (BID) | ORAL | 0 refills | Status: DC

## 2023-11-09 NOTE — Telephone Encounter (Signed)
 Called and spoke with patient regarding Dr. Elesa Hacker recommendations. Pt would like to try Cholestyramine as outlined below. Pt will not have to stop medication for upcoming Bravo. Pt requested that referral be sent to [facility] since it is closer. Pt knows to expect a call from [facility] to help set up appt. Pt verbalized understanding and had no concerns at the end of the call.  Referral, records, patient's demographic, and insurance information have been faxed to [facility] (P: [phone], F: [phone])

## 2023-11-09 NOTE — Addendum Note (Signed)
 Addended by: Marisa Sprinkles on: 11/09/2023 12:41 PM   Modules accepted: Orders

## 2023-11-09 NOTE — Telephone Encounter (Signed)
 The possibility of bile gastritis/reflux playing some role is reasonable to consider with her prior cholecystectomy. Let's trial Cholestyramine 1 pack twice daily.   Start 1 packet daily for 2-weeks and then will see how she does and she can titrate to twice daily. This may/may not help. She does need the Bravo testing soon. Would also set expectation, that we should strongly consider a referral to be placed for a 2nd opinion to see if we are missing anything else in her workup (would send to [facility]) - she will maintain [place] as her primary GI team however. If she is OK with that, please place referral as well. Thanks. GM

## 2023-11-17 ENCOUNTER — Ambulatory Visit: Payer: Medicaid Other | Admitting: Dietician

## 2023-11-18 ENCOUNTER — Encounter: Payer: Self-pay | Admitting: Internal Medicine

## 2023-11-18 ENCOUNTER — Ambulatory Visit: Payer: Medicaid Other | Admitting: Internal Medicine

## 2023-11-18 VITALS — BP 141/87 | HR 84 | Temp 98.8°F | Resp 18 | Ht 62.0 in | Wt 185.0 lb

## 2023-11-18 DIAGNOSIS — K449 Diaphragmatic hernia without obstruction or gangrene: Secondary | ICD-10-CM | POA: Diagnosis not present

## 2023-11-18 DIAGNOSIS — R112 Nausea with vomiting, unspecified: Secondary | ICD-10-CM | POA: Diagnosis not present

## 2023-11-18 DIAGNOSIS — K2950 Unspecified chronic gastritis without bleeding: Secondary | ICD-10-CM

## 2023-11-18 DIAGNOSIS — F419 Anxiety disorder, unspecified: Secondary | ICD-10-CM | POA: Diagnosis not present

## 2023-11-18 DIAGNOSIS — K2100 Gastro-esophageal reflux disease with esophagitis, without bleeding: Secondary | ICD-10-CM | POA: Diagnosis not present

## 2023-11-18 DIAGNOSIS — K317 Polyp of stomach and duodenum: Secondary | ICD-10-CM

## 2023-11-18 DIAGNOSIS — K219 Gastro-esophageal reflux disease without esophagitis: Secondary | ICD-10-CM

## 2023-11-18 DIAGNOSIS — R1013 Epigastric pain: Secondary | ICD-10-CM

## 2023-11-18 DIAGNOSIS — R12 Heartburn: Secondary | ICD-10-CM | POA: Diagnosis not present

## 2023-11-18 MED ORDER — SODIUM CHLORIDE 0.9 % IV SOLN
4.0000 mg | Freq: Once | INTRAVENOUS | Status: DC
Start: 2023-11-18 — End: 2023-11-18

## 2023-11-18 MED ORDER — SODIUM CHLORIDE 0.9 % IV SOLN: 500.0000 mL | Freq: Once | INTRAVENOUS | Status: DC

## 2023-11-18 MED ORDER — ONDANSETRON HCL 4 MG PO TABS
4.0000 mg | ORAL_TABLET | Freq: Once | ORAL | Status: AC
Start: 2023-11-18 — End: 2023-11-18
  Administered 2023-11-18: 4 mg via ORAL

## 2023-11-18 NOTE — Progress Notes (Signed)
 GASTROENTEROLOGY PROCEDURE H&P NOTE   Primary Care Physician: Glendale Chard, DO    Reason for Procedure:   N&V, upper abdominal/chest pain, GERD  Plan:    EGD with BRAVO  Patient is appropriate for endoscopic procedure(s) in the ambulatory ([facility]) setting.  The nature of the procedure, as well as the risks, benefits, and alternatives were carefully and thoroughly reviewed with the patient. Ample time for discussion and questions allowed. The patient understood, was satisfied, and agreed to proceed.     HPI: Tamara Church is a 48 y.o. female who presents for EGD with BRAVO for evaluation of N&V and upper abdominal/chest pain, GERD.  Patient was most recently seen in the Gastroenterology Clinic on 08/11/23.  No interval change in medical history since that appointment. Please refer to that note for full details regarding GI history and clinical presentation.   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   Gastritis and gastroduodenitis 07/27/2020    Past Surgical History:  Procedure Laterality Date   93 HOUR PH STUDY N/A 08/25/2023   Procedure: 24 HOUR PH STUDY;  Surgeon: Lemar Lofty., MD;  Location: [facility];  Service: Gastroenterology;  Laterality: N/A;   BIOPSY  05/26/2023   Procedure: BIOPSY;  Surgeon: Lemar Lofty., MD;  Location: [facility];  Service: Gastroenterology;;   COLONOSCOPY  05/2023   ESOPHAGEAL MANOMETRY N/A 08/25/2023   Procedure: ESOPHAGEAL MANOMETRY (EM);  Surgeon: Lemar Lofty., MD;  Location: [facility];  Service: Gastroenterology;  Laterality: N/A;   ESOPHAGOGASTRODUODENOSCOPY (EGD) WITH PROPOFOL N/A 05/26/2023   Procedure: ESOPHAGOGASTRODUODENOSCOPY (EGD) WITH PROPOFOL;  Surgeon: Meridee Score Netty Starring., MD;  Location: [facility];  Service: Gastroenterology;  Laterality: N/A;   NO PAST  SURGERIES     UPPER GASTROINTESTINAL ENDOSCOPY      Prior to Admission medications   Medication Sig Start Date End Date Taking? Authorizing Provider  amLODipine (NORVASC) 10 MG tablet Take 1 tablet by mouth once daily 11/04/23  Yes Glendale Chard, DO  folic acid (FOLVITE) 1 MG tablet Take 1 tablet (1 mg total) by mouth daily. 10/13/23  Yes Mansouraty, Netty Starring., MD  levonorgestrel (MIRENA) 20 MCG/24HR IUD 1 each by Intrauterine route once.   Yes [provider]  spironolactone (ALDACTONE) 25 MG tablet Take 2 tablets (50 mg total) by mouth at bedtime. 09/30/23  Yes Glendale Chard, DO  cholestyramine (QUESTRAN) 4 g packet Take 1 packet (4 g total) by mouth 2 (two) times daily. 1 packet daily for 2-weeks, after this can titrate to twice daily. Patient not taking: Reported on 11/18/2023 11/09/23   Mansouraty, Netty Starring., MD  famotidine (PEPCID) 20 MG tablet Take 1 tablet by mouth twice daily 10/17/23   Arnaldo Natal, NP  hydrOXYzine (ATARAX) 10 MG tablet Take 1 tablet (10 mg total) by mouth 2 (two) times daily. Patient not taking: Reported on 11/18/2023 07/21/23   Mansouraty, Netty Starring., MD  loperamide (IMODIUM) 2 MG capsule Take 1 capsule (2 mg total) by mouth 4 (four) times daily as needed for diarrhea or loose stools. Patient not taking: Reported on 11/18/2023 09/14/23   Arthor Captain, PA-C  ondansetron (ZOFRAN-ODT) 4 MG disintegrating tablet Take 1 tablet (4 mg total) by mouth every 8 (eight) hours as needed. Patient not taking: Reported on 11/18/2023 08/11/23   Meredith Pel, NP  pantoprazole (PROTONIX) 40 MG tablet Take 1 tablet (40 mg  total) by mouth daily. 10/14/23   Bethann Berkshire, MD  prochlorperazine (COMPAZINE) 10 MG tablet Take 1 tablet (10 mg total) by mouth every 8 (eight) hours as needed for nausea or vomiting. Patient not taking: Reported on 11/18/2023 10/14/23   Bethann Berkshire, MD  promethazine (PHENERGAN) 25 MG suppository Place 1 suppository (25 mg total) rectally  every 6 (six) hours as needed for nausea or vomiting. Patient not taking: Reported on 11/18/2023 07/24/23   Coral Spikes, DO  sucralfate (CARAFATE) 1 g tablet Take 1 tablet (1 g total) by mouth 2 (two) times daily. Patient not taking: Reported on 11/18/2023 08/11/23   Meredith Pel, NP    Current Outpatient Medications  Medication Sig Dispense Refill   amLODipine (NORVASC) 10 MG tablet Take 1 tablet by mouth once daily 90 tablet 0   folic acid (FOLVITE) 1 MG tablet Take 1 tablet (1 mg total) by mouth daily. 30 tablet 3   levonorgestrel (MIRENA) 20 MCG/24HR IUD 1 each by Intrauterine route once.     spironolactone (ALDACTONE) 25 MG tablet Take 2 tablets (50 mg total) by mouth at bedtime. 180 tablet 3   cholestyramine (QUESTRAN) 4 g packet Take 1 packet (4 g total) by mouth 2 (two) times daily. 1 packet daily for 2-weeks, after this can titrate to twice daily. (Patient not taking: Reported on 11/18/2023) 60 each 0   famotidine (PEPCID) 20 MG tablet Take 1 tablet by mouth twice daily 60 tablet 1   hydrOXYzine (ATARAX) 10 MG tablet Take 1 tablet (10 mg total) by mouth 2 (two) times daily. (Patient not taking: Reported on 11/18/2023) 30 tablet 0   loperamide (IMODIUM) 2 MG capsule Take 1 capsule (2 mg total) by mouth 4 (four) times daily as needed for diarrhea or loose stools. (Patient not taking: Reported on 11/18/2023) 12 capsule 0   ondansetron (ZOFRAN-ODT) 4 MG disintegrating tablet Take 1 tablet (4 mg total) by mouth every 8 (eight) hours as needed. (Patient not taking: Reported on 11/18/2023) 30 tablet 5   pantoprazole (PROTONIX) 40 MG tablet Take 1 tablet (40 mg total) by mouth daily. 30 tablet 1   prochlorperazine (COMPAZINE) 10 MG tablet Take 1 tablet (10 mg total) by mouth every 8 (eight) hours as needed for nausea or vomiting. (Patient not taking: Reported on 11/18/2023) 20 tablet 0   promethazine (PHENERGAN) 25 MG suppository Place 1 suppository (25 mg total) rectally every 6 (six) hours as  needed for nausea or vomiting. (Patient not taking: Reported on 11/18/2023) 12 each 0   sucralfate (CARAFATE) 1 g tablet Take 1 tablet (1 g total) by mouth 2 (two) times daily. (Patient not taking: Reported on 11/18/2023) 60 tablet 5   Current Facility-Administered Medications  Medication Dose Route Frequency Provider Last Rate Last Admin   0.9 %  sodium chloride infusion  500 mL Intravenous Once Imogene Burn, MD        Allergies as of 11/18/2023 - Review Complete 11/18/2023  Allergen Reaction Noted   Hctz [hydrochlorothiazide] Other (See Comments) 03/14/2020   Ace inhibitors Cough 06/17/2015   Angiotensin receptor blockers Other (See Comments) 03/14/2020    Family History  Problem Relation Age of Onset   Hypertension Mother    Colon cancer Neg Hx    Esophageal cancer Neg Hx    Rectal cancer Neg Hx    Stomach cancer Neg Hx    Inflammatory bowel disease Neg Hx    Liver disease Neg Hx    Pancreatic cancer  Neg Hx     Social History   Socioeconomic History   Marital status: Married    Spouse name: Not on file   Number of children: 6   Years of education: Not on file   Highest education level: Not on file  Occupational History   Not on file  Tobacco Use   Smoking status: Never   Smokeless tobacco: Never  Vaping Use   Vaping status: Never Used  Substance and Sexual Activity   Alcohol use: No   Drug use: No   Sexual activity: Yes    Birth control/protection: I.U.D.    Comment: pt would like to start depo provera today  Other Topics Concern   Not on file  Social History Narrative   Not on file   Social Drivers of Health   Financial Resource Strain: Not on file  Food Insecurity: No Food Insecurity (05/31/2023)   Hunger Vital Sign    Worried About Running Out of Food in the Last Year: Never true    Ran Out of Food in the Last Year: Never true  Transportation Needs: No Transportation Needs (05/31/2023)   PRAPARE - Administrator, Civil Service (Medical): No     Lack of Transportation (Non-Medical): No  Physical Activity: Not on file  Stress: Not on file  Social Connections: Not on file  Intimate Partner Violence: Not At Risk (05/23/2023)   Humiliation, Afraid, Rape, and Kick questionnaire    Fear of Current or Ex-Partner: No    Emotionally Abused: No    Physically Abused: No    Sexually Abused: No    Physical Exam: Vital signs in last 24 hours: BP 128/88   Pulse 100   Temp 98.8 F (37.1 C) (Temporal)   Ht 5\' 2"  (1.575 m)   Wt 185 lb (83.9 kg)   SpO2 98%   BMI 33.84 kg/m  GEN: NAD EYE: Sclerae anicteric ENT: MMM CV: Non-tachycardic Pulm: No increased WOB GI: Soft NEURO:  Alert & Oriented   Eulah Pont, MD [facility]   11/18/2023 2:50 PM

## 2023-11-18 NOTE — Op Note (Signed)
 [facility] Patient Name: Tamara Church Procedure Date: 11/18/2023 2:53 PM MRN: [id number] Endoscopist: Particia Lather , , [id number] Age: 48 Referring MD:  Date of Birth: January 03, 1976 Gender: Female Account #: [id number] Procedure:                Upper GI endoscopy Indications:              Epigastric abdominal pain, Heartburn, Nausea with                            vomiting Medicines:                Monitored Anesthesia Care Procedure:                Pre-Anesthesia Assessment:                           - Prior to the procedure, a History and Physical                            was performed, and patient medications and                            allergies were reviewed. The patient's tolerance of                            previous anesthesia was also reviewed. The risks                            and benefits of the procedure and the sedation                            options and risks were discussed with the patient.                            Church questions were answered, and informed consent                            was obtained. Prior Anticoagulants: The patient has                            taken no anticoagulant or antiplatelet agents. ASA                            Grade Assessment: II - A patient with mild systemic                            disease. After reviewing the risks and benefits,                            the patient was deemed in satisfactory condition to                            undergo the procedure.  After obtaining informed consent, the endoscope was                            passed under direct vision. Throughout the                            procedure, the patient's blood pressure, pulse, and                            oxygen saturations were monitored continuously. The                            Olympus scope [id number] was introduced through the                            mouth, and advanced to the second part  of duodenum.                            The upper GI endoscopy was accomplished without                            difficulty. The patient tolerated the procedure                            well. Scope In: Scope Out: Findings:                 The examined esophagus was normal.                           A 2 cm hiatal hernia was present.                           The BRAVO capsule with delivery system was                            introduced through the mouth and advanced into the                            esophagus, such that the BRAVO pH capsule was                            positioned 30 cm from the incisors, which was 6 cm                            proximal to the GE junction. The BRAVO pH capsule                            was then deployed and attached to the esophageal                            mucosa. The delivery system was then withdrawn.  Endoscopy was utilized for probe placement and                            diagnostic evaluation. The scope was reinserted to                            evaluate placement of the BRAVO capsule.                            Visualization showed the BRAVO capsule to be in an                            appropriate position.                           One 8 mm sessile polyp with no bleeding and no                            stigmata of recent bleeding was found in the                            gastric body. The polyp was removed with a cold                            snare. Resection and retrieval were complete.                           The examined duodenum was normal. Complications:            No immediate complications. Estimated Blood Loss:     Estimated blood loss was minimal. Impression:               - Normal esophagus.                           - 2 cm hiatal hernia.                           - One gastric polyp. Resected and retrieved.                           - Normal examined duodenum.                            - The BRAVO pH capsule was deployed. Recommendation:           - Discharge patient to home (with escort).                           - Await pathology results.                           - Will follow up on the results of BRAVO.                           - The findings and recommendations were discussed  with the patient. Dr Particia Lather "[place]" Anton Ruiz,  11/18/2023 3:29:52 PM

## 2023-11-18 NOTE — Patient Instructions (Signed)
 -Handout on BRAVO provided -await pathology results -Continue present medications   YOU HAD AN ENDOSCOPIC PROCEDURE TODAY AT THE [facility]:   Refer to the procedure report that was given to you for any specific questions about what was found during the examination.  If the procedure report does not answer your questions, please call your gastroenterologist to clarify.  If you requested that your care partner not be given the details of your procedure findings, then the procedure report has been included in a sealed envelope for you to review at your convenience later.  YOU SHOULD EXPECT: Some feelings of bloating in the abdomen. Passage of more gas than usual.  Walking can help get rid of the air that was put into your GI tract during the procedure and reduce the bloating. If you had a lower endoscopy (such as a colonoscopy or flexible sigmoidoscopy) you may notice spotting of blood in your stool or on the toilet paper. If you underwent a bowel prep for your procedure, you may not have a normal bowel movement for a few days.  Please Note:  You might notice some irritation and congestion in your nose or some drainage.  This is from the oxygen used during your procedure.  There is no need for concern and it should clear up in a day or so.  SYMPTOMS TO REPORT IMMEDIATELY:  Following upper endoscopy (EGD)  Vomiting of blood or coffee ground material  New chest pain or pain under the shoulder blades  Painful or persistently difficult swallowing  New shortness of breath  Fever of 100F or higher  Black, tarry-looking stools  For urgent or emergent issues, a gastroenterologist can be reached at any hour by calling [phone]. Do not use MyChart messaging for urgent concerns.    DIET:  We do recommend a small meal at first, but then you may proceed to your regular diet.  Drink plenty of fluids but you should avoid alcoholic beverages for 24 hours.  ACTIVITY:  You should plan to  take it easy for the rest of today and you should NOT DRIVE or use heavy machinery until tomorrow (because of the sedation medicines used during the test).    FOLLOW UP: Our staff will call the number listed on your records the next business day following your procedure.  We will call around 7:15- 8:00 am to check on you and address any questions or concerns that you may have regarding the information given to you following your procedure. If we do not reach you, we will leave a message.     If any biopsies were taken you will be contacted by phone or by letter within the next 1-3 weeks.  Please call us at [phone] if you have not heard about the biopsies in 3 weeks.    SIGNATURES/CONFIDENTIALITY: You and/or your care partner have signed paperwork which will be entered into your electronic medical record.  These signatures attest to the fact that that the information above on your After Visit Summary has been reviewed and is understood.  Full responsibility of the confidentiality of this discharge information lies with you and/or your care-partner. Post-op Bravo pH instructions Once you get home:  Eat normally and go about your daily routine/activities Limit drinking fluids or eating between meals Do not chew gum or eat hard candy DO NOT take any antacid or anti-reflux medications during the 48-hour monitoring time, unless instructed by your physician  Recording events: Events to be recorded are:  Record using event buttons on recorder and write on paper diary form Every time you eat or drink something (other than water) 2.   Periods of lying down/reclining 3.  Symptoms:  may include heartburn, regurgitation, chest pain, cough or specify if other.  A paper diary is also provided to record the times of your reflux symptoms and times for meals and when you lie down.  The recorder needs to remain within 3 feet (arms length) of you during the testing period (48 hours). If you should  forget and move outside of a 3-foot radius of the receiver you may hear beeping and you will see a "C1" error in the display window on the top of the receiver.  Please pick up the receiver and hold close to you to re-establish the connection and the error message disappears.  You may take a bath/shower during the testing period, but the recorder must not get wet and must remain within 3 feet of you. Please leave the receiver outside of the shower or tub while bathing. The monitoring period will be for 48 hours after placement of the capsule.  At the end of the 48 hours, you will return the recorder, and your diary, to our [facility] front desk.  A nurse will meet you to collect the device and answer any questions you may have.  The device should turn off once the 48 hours is complete.   What to expect after placement of the capsule:  Some patients experience a vague sensation that something is in their esophagus or that they 'feel' the capsule when they swallow food.  Should you experience this, chewing food carefully or drinking liquids may minimize this sensation.   After the test is complete, the disposable capsule will fall off the wall of your esophagus within 5-10 days and pass naturally with your bowel movement through the digestive tract.  Once the recorder is returned, your provider will review and interpret your recordings and contact you to discuss your results.  This may take up to two weeks.   DO NOT have an MRI for 30 days after your procedure to ensure the capsule is no longer inside your body  It is imperative that you return the recorder on _3-3-25___ by 3:00pm.  Your information must be downloaded at this time to obtain your results.

## 2023-11-18 NOTE — Progress Notes (Signed)
 Bravo capsule placed at 1520 at 30cm.

## 2023-11-18 NOTE — Progress Notes (Signed)
 Called to room to assist during endoscopic procedure.  Patient ID and intended procedure confirmed with present staff. Received instructions for my participation in the procedure from the performing physician.

## 2023-11-18 NOTE — Progress Notes (Signed)
 Pt's states no medical or surgical changes since previsit or office visit.

## 2023-11-18 NOTE — Progress Notes (Signed)
 Report to PACU, RN, vss, BBS= Clear.

## 2023-11-19 ENCOUNTER — Encounter: Payer: Self-pay | Admitting: Gastroenterology

## 2023-11-19 ENCOUNTER — Telehealth: Payer: Self-pay | Admitting: *Deleted

## 2023-11-19 ENCOUNTER — Ambulatory Visit: Payer: Medicaid Other | Admitting: Gastroenterology

## 2023-11-19 ENCOUNTER — Telehealth: Payer: Self-pay | Admitting: Pharmacy Technician

## 2023-11-19 ENCOUNTER — Ambulatory Visit (INDEPENDENT_AMBULATORY_CARE_PROVIDER_SITE_OTHER): Payer: Medicaid Other | Admitting: Gastroenterology

## 2023-11-19 ENCOUNTER — Other Ambulatory Visit: Payer: Medicaid Other

## 2023-11-19 ENCOUNTER — Other Ambulatory Visit: Payer: Self-pay

## 2023-11-19 ENCOUNTER — Other Ambulatory Visit (HOSPITAL_COMMUNITY): Payer: Self-pay

## 2023-11-19 VITALS — BP 122/84 | HR 62 | Ht 62.0 in | Wt 177.0 lb

## 2023-11-19 DIAGNOSIS — G8929 Other chronic pain: Secondary | ICD-10-CM | POA: Diagnosis not present

## 2023-11-19 DIAGNOSIS — R110 Nausea: Secondary | ICD-10-CM | POA: Diagnosis not present

## 2023-11-19 DIAGNOSIS — K219 Gastro-esophageal reflux disease without esophagitis: Secondary | ICD-10-CM

## 2023-11-19 DIAGNOSIS — K2100 Gastro-esophageal reflux disease with esophagitis, without bleeding: Secondary | ICD-10-CM

## 2023-11-19 DIAGNOSIS — R109 Unspecified abdominal pain: Secondary | ICD-10-CM

## 2023-11-19 DIAGNOSIS — R1114 Bilious vomiting: Secondary | ICD-10-CM | POA: Diagnosis not present

## 2023-11-19 DIAGNOSIS — K224 Dyskinesia of esophagus: Secondary | ICD-10-CM | POA: Diagnosis not present

## 2023-11-19 MED ORDER — DEXLANSOPRAZOLE 60 MG PO CPDR: 60.0000 mg | DELAYED_RELEASE_CAPSULE | Freq: Every day | ORAL | 3 refills | Status: DC

## 2023-11-19 MED ORDER — ONDANSETRON 4 MG PO TBDP: 4.0000 mg | ORAL_TABLET | Freq: Three times a day (TID) | ORAL | 2 refills | Status: DC | PRN

## 2023-11-19 NOTE — Patient Instructions (Addendum)
 Stop Pantoprazole.   Start Dexilant.   We have sent the following medications to your pharmacy for you to pick up at your convenience: Dexilant, Zofran   Your provider has requested that you go to the basement level for lab work before leaving today. Press "B" on the elevator. The lab is located at the first door on the left as you exit the elevator.  Follow-up with Doug Sou- PA on 01/03/24 at 10:00 am   _______________________________________________________  If your blood pressure at your visit was 140/90 or greater, please contact your primary care physician to follow up on this.  _______________________________________________________  If you are age 97 or older, your body mass index should be between 23-30. Your Body mass index is 32.37 kg/m. If this is out of the aforementioned range listed, please consider follow up with your Primary Care Provider.  If you are age 47 or younger, your body mass index should be between 19-25. Your Body mass index is 32.37 kg/m. If this is out of the aformentioned range listed, please consider follow up with your Primary Care Provider.   ________________________________________________________  The [facility] providers would like to encourage you to use [facility] to communicate with providers for non-urgent requests or questions.  Due to long hold times on the telephone, sending your provider a message by [facility] may be a faster and more efficient way to get a response.  Please allow 48 business hours for a response.  Please remember that this is for non-urgent requests.  _______________________________________________________  Thank you for choosing me and [facility].  Dr. Meridee Score

## 2023-11-19 NOTE — Telephone Encounter (Signed)
 Thanks. We look forward to an update on the pending status. GM

## 2023-11-19 NOTE — Telephone Encounter (Signed)
  Follow up Call-     11/18/2023    2:25 PM 06/15/2023    8:57 AM 08/29/2022   11:08 AM  Call back number  Post procedure Call Back phone  # [phone]  Permission to leave phone message Yes Yes Yes     Patient questions:  Do you have a fever, pain , or abdominal swelling? No. Pain Score  0 *  Have you tolerated food without any problems? Yes.    Have you been able to return to your normal activities? Yes.    Do you have any questions about your discharge instructions: Diet   No. Medications  No. Follow up visit  No.  Do you have questions or concerns about your Care? No.  Actions: * If pain score is 4 or above: No action needed, pain <4.

## 2023-11-19 NOTE — Telephone Encounter (Signed)
 Pharmacy Patient Advocate Encounter   Received notification from CoverMyMeds that prior authorization for Dexilant 60mg  is required/requested.   Insurance verification completed.   The patient is insured through [facility] .   Per test claim: PA required; PA submitted to above mentioned insurance via CoverMyMeds Key/confirmation #/[id number] [facility] Status is pending

## 2023-11-19 NOTE — Progress Notes (Signed)
 GASTROENTEROLOGY OUTPATIENT CLINIC VISIT   Primary Care Provider Glendale Chard, DO [address] [place] [address] [phone]  Patient Profile: Tamara Church is a 48 y.o. female with a pmh significant for hypertension, anemia, Anxiety, cholelithiasis (s/p CCK), hiatal hernia, GERD (microscopic esophagitis noted on biopsies previously), recurrent Abdominal Pain/Nausea/Vomiting Episodes, Obesity.  The patient presents to the [facility] for an evaluation and management of problem(s) noted below:  Problem List 1. Gastroesophageal reflux disease without esophagitis   2. Chronic abdominal pain   3. Bilious vomiting with nausea   4. Ineffective esophageal motility    Discussed the use of AI scribe software for clinical note transcription with the patient, who gave verbal consent to proceed.  History of Present Illness Please see prior progress notes for full details of HPI.  Interval History The patient comes in for follow-up.  She just had her EGD with Bravo placement yesterday.  For the last week, she has been doing OK until the nausea from being NPO caused her some symptoms before EGD.  She feels that even with PPI she is still having reflux.  She stopped taking Carafate thinking that it was causing her some issues and she is not sure if she wants to be on it again.  She has a history of previous migraines when younger but hasn't had one in years.  No other family history for significant GI issues either has been noted.  Pain episodes occur for hours or days leading to significant nausea and vomiting but then can be symptom free for days or weeks at a time.   GI Review of Systems Positive as above Negative for bloating   Review of Systems General: Denies fevers/chills/unintentional weight loss Cardiovascular: Denies chest pain Pulmonary: Denies shortness of breath Gastroenterological: See HPI Genitourinary: Denies darkened urine Hematological: Denies  easy bruising/bleeding Dermatological: Denies jaundice Psychological: Mood is stable   Medications Current Outpatient Medications  Medication Sig Dispense Refill   amLODipine (NORVASC) 10 MG tablet Take 1 tablet by mouth once daily 90 tablet 0   dexlansoprazole (DEXILANT) 60 MG capsule Take 1 capsule (60 mg total) by mouth daily. 90 capsule 3   levonorgestrel (MIRENA) 20 MCG/24HR IUD 1 each by Intrauterine route once.     spironolactone (ALDACTONE) 25 MG tablet Take 2 tablets (50 mg total) by mouth at bedtime. 180 tablet 3   folic acid (FOLVITE) 1 MG tablet Take 1 tablet (1 mg total) by mouth daily. (Patient not taking: Reported on 11/19/2023) 30 tablet 3   hydrOXYzine (ATARAX) 10 MG tablet Take 1 tablet (10 mg total) by mouth 2 (two) times daily. (Patient not taking: Reported on 11/19/2023) 30 tablet 0   loperamide (IMODIUM) 2 MG capsule Take 1 capsule (2 mg total) by mouth 4 (four) times daily as needed for diarrhea or loose stools. (Patient not taking: Reported on 11/19/2023) 12 capsule 0   ondansetron (ZOFRAN-ODT) 4 MG disintegrating tablet Take 1 tablet (4 mg total) by mouth every 8 (eight) hours as needed. 30 tablet 2   promethazine (PHENERGAN) 25 MG suppository Place 1 suppository (25 mg total) rectally every 6 (six) hours as needed for nausea or vomiting. (Patient not taking: Reported on 11/19/2023) 12 each 0   No current facility-administered medications for this visit.    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   Gastritis and gastroduodenitis 07/27/2020   Past Surgical History:  Procedure Laterality Date   33 HOUR PH STUDY N/A 08/25/2023    Procedure: 24 HOUR PH STUDY;  Surgeon: Lemar Lofty., MD;  Location: [facility];  Service: Gastroenterology;  Laterality: N/A;   BIOPSY  05/26/2023   Procedure: BIOPSY;  Surgeon: Lemar Lofty., MD;  Location: [facility];  Service: Gastroenterology;;   COLONOSCOPY  05/2023   ESOPHAGEAL MANOMETRY N/A 08/25/2023   Procedure: ESOPHAGEAL MANOMETRY (EM);  Surgeon: Lemar Lofty., MD;  Location: [facility];  Service: Gastroenterology;  Laterality: N/A;   ESOPHAGOGASTRODUODENOSCOPY (EGD) WITH PROPOFOL N/A 05/26/2023   Procedure: ESOPHAGOGASTRODUODENOSCOPY (EGD) WITH PROPOFOL;  Surgeon: Meridee Score Netty Starring., MD;  Location: [facility];  Service: Gastroenterology;  Laterality: N/A;   NO PAST SURGERIES     UPPER GASTROINTESTINAL ENDOSCOPY     Social History   Socioeconomic History   Marital status: Married    Spouse name: Not on file   Number of children: 6   Years of education: Not on file   Highest education level: Not on file  Occupational History   Not on file  Tobacco Use   Smoking status: Never   Smokeless tobacco: Never  Vaping Use   Vaping status: Never Used  Substance and Sexual Activity   Alcohol use: No   Drug use: No   Sexual activity: Yes    Birth control/protection: I.U.D.    Comment: pt would like to start depo provera today  Other Topics Concern   Not on file  Social History Narrative   Not on file   Social Drivers of Health   Financial Resource Strain: Not on file  Food Insecurity: No Food Insecurity (05/31/2023)   Hunger Vital Sign    Worried About Running Out of Food in the Last Year: Never true    Ran Out of Food in the Last Year: Never true  Transportation Needs: No Transportation Needs (05/31/2023)   PRAPARE - Administrator, Civil Service (Medical): No    Lack of Transportation (Non-Medical): No  Physical Activity: Not on file  Stress: Not on file  Social Connections: Not on file  Intimate Partner Violence:  Not At Risk (05/23/2023)   Humiliation, Afraid, Rape, and Kick questionnaire    Fear of Current or Ex-Partner: No    Emotionally Abused: No    Physically Abused: No    Sexually Abused: No   Family History  Problem Relation Age of Onset   Hypertension Mother    Colon cancer Neg Hx    Esophageal cancer Neg Hx    Rectal cancer Neg Hx    Stomach cancer Neg Hx    Inflammatory bowel disease Neg Hx    Liver disease Neg Hx    Pancreatic cancer Neg Hx    I have reviewed her medical, social, and family history in detail and updated the electronic medical record as necessary.    PHYSICAL EXAMINATION  BP 122/84   Pulse 62   Ht 5\' 2"  (1.575 m)   Wt 177 lb (80.3 kg)   BMI 32.37 kg/m  Wt Readings from Last 3 Encounters:  11/19/23 177 lb (80.3 kg)  11/18/23 185 lb (83.9 kg)  10/16/23 185 lb (83.9 kg)  GEN: NAD, appears stated age, doesn't appear chronically ill PSYCH: Cooperative,  without pressured speech EYE: Conjunctivae pink, sclerae anicteric ENT: MMM CV: Nontachycardic  RESP: No audible wheezing GI: NABS, soft, rounded, protuberant, nontender, without rebound  MSK/EXT: No significant lower extremity edema SKIN: No jaundice NEURO:  Alert & Oriented x 3, no focal deficits   REVIEW OF DATA  I reviewed the following data at the time of this encounter:  GI Procedures and Studies  2/25 EGD Bravo - Normal esophagus. - 2 cm hiatal hernia. - One gastric polyp. Resected and retrieved. - Normal examined duodenum. - The BRAVO pH capsule was deployed.  Esophageal Manometry   2024 Colonoscopy - Hemorrhoids found on digital rectal exam. - The examined portion of the ileum was normal. - Normal mucosa in the entire examined colon. - Non-bleeding non-thrombosed internal hemorrhoids  2024 EGD - No gross lesions in the entire esophagus. Biopsied. - Z-line irregular, 39 cm from the incisors. - 2 cm hiatal hernia. - Erythematous mucosa in the stomach. Biopsied. - No gross lesions in the  duodenal bulb, in the first portion of the duodenum and in the second portion of the duodenum.  Laboratory Studies  Reviewed those in [facility]  Imaging Studies  January 2025 CTAP IMPRESSION: 1. No acute process demonstrated in the abdomen or pelvis. No evidence of bowel obstruction or inflammation. 2. Mild diffuse fatty infiltration of the liver. 3. Small esophageal hiatal hernia. 4. Intrauterine device is present.  12/24 CTAP IMPRESSION: No acute process demonstrated in the abdomen or pelvis. No evidence of bowel obstruction or inflammation. 3.6 cm left ovarian cyst, likely physiologic. No follow-up imaging recommended. Note: This recommendation does not apply to premenarchal patients and to those with increased risk (genetic, family history, elevated tumor markers or other high-risk factors) of ovarian cancer. Reference: JACR 2020 Feb; 17(2):248-254   ASSESSMENT  Ms. Sheriff is a 48 y.o. female with a pmh significant for hypertension, anemia, Anxiety, cholelithiasis (s/p CCK), hiatal hernia, GERD (microscopic esophagitis noted on biopsies previously), recurrent Abdominal Pain/Nausea/Vomiting Episodes, Obesity.  The patient is seen today for evaluation and management of:  1. Gastroesophageal reflux disease without esophagitis   2. Chronic abdominal pain   3. Bilious vomiting with nausea   4. Ineffective esophageal motility    The patient is currently stable hemodynamically and clinically.  Etiology of her recurrent symptomatology continues to be difficult to understand.  She may uncontrolled GERD (felt less likely but Bravo is pending).  Bile reflux, could also be a possibility and we are holding on Cholestyramine for now.  Have to begin to wonder about functional abdominal disorders and the role of a TCA.  I want to rule out a porphyria, so we will get baseline urine studies to see if any abnormalities are noted, but if negative, she will need to bring in urine studies at the time of an  episode of pain to see if these are elevated.  Consideration of abdominal migraines should be made as well, but her symptoms last longer than a few hours, though she can be clinically well for days/weeks between episodes.  We will switch her PPI (though she knows to wait until after Bravo is done to make the switch).  She has a 2nd opinion referral placed to [facility], to ensure we are not missing anything else.  I do not think her ineffective esophageal motility is causing her abdominal pain episodes, but it could be reason for her reflux symptomatology (unfortunately there are no medications for this).  All patient questions were answered to the best of  my ability, and the patient agrees to the aforementioned plan of action with follow-up as indicated.   PLAN  Will see results of Bravo to rule out uncontrolled GERD Will switch to Dexilant Hold restart Carafate Consider Cholestyramine for bile gastritis/reflux use in future Antiemetics PRN OK and willing to prescribe in future Urine studies as baseline for rule out Porphyrias Urine studies to be done at time of pain episode as well Consider TCA in future 2nd opinion referral in place to [facility]   Orders Placed This Encounter  Procedures   Porphyrins, fractionated, random urine   Porphobilinogen, random urine   Creatinine, Urine   ALA Delta, random urine     New Prescriptions   DEXLANSOPRAZOLE (DEXILANT) 60 MG CAPSULE    Take 1 capsule (60 mg total) by mouth daily.   Modified Medications   Modified Medication Previous Medication   ONDANSETRON (ZOFRAN-ODT) 4 MG DISINTEGRATING TABLET ondansetron (ZOFRAN-ODT) 4 MG disintegrating tablet      Take 1 tablet (4 mg total) by mouth every 8 (eight) hours as needed.    Take 1 tablet (4 mg total) by mouth every 8 (eight) hours as needed.    Planned Follow Up No follow-ups on file.   Total Time in Face-to-Face and in Coordination of Care for patient including independent/personal interpretation/review  of prior testing, medical history, examination, medication adjustment, communicating results with the patient directly, and documentation with the EHR is 40 minutes.   [name], MD [facility] Office # [phone]

## 2023-11-20 ENCOUNTER — Encounter: Payer: Self-pay | Admitting: Gastroenterology

## 2023-11-20 DIAGNOSIS — G8929 Other chronic pain: Secondary | ICD-10-CM | POA: Insufficient documentation

## 2023-11-20 DIAGNOSIS — K224 Dyskinesia of esophagus: Secondary | ICD-10-CM | POA: Insufficient documentation

## 2023-11-23 LAB — SURGICAL PATHOLOGY

## 2023-11-25 ENCOUNTER — Other Ambulatory Visit (HOSPITAL_COMMUNITY): Payer: Self-pay

## 2023-11-25 ENCOUNTER — Telehealth: Payer: Self-pay | Admitting: Pharmacy Technician

## 2023-11-25 NOTE — Telephone Encounter (Signed)
 Pharmacy Patient Advocate Encounter  Received notification from [facility] that Prior Authorization for Dexilant 60 mg capsules has been  NA  The product does not require a prior authorization. The plan prefers name brand only   PA #/Case ID/Reference #: [id number]

## 2023-11-27 LAB — PORPHYRINS, FRACTIONATED, RANDOM URINE
COPROPORPHYRIN I: 16.4 ug/g{creat} (ref 6.5–33.2)
Coproporphyrin III (PORFRU): 8.2 ug/g{creat} (ref 4.8–88.6)
HEPTACARBOXYPORPHYRIN: 0.6 ug/g{creat} (ref <=3.4)
HEXACARBOXYPORPHYRIN: 0.4 ug/g{creat} (ref <=6.3)
PENTACARBOXYPORPHYRIN: 0.6 ug/g{creat} (ref <=4.1)
TOTAL PORPHYRINS: 38 ug/g{creat} (ref 27.0–153.6)
Uroporphyrin I: 9.7 ug/g{creat} (ref 3.6–21.1)
Uroporphyrin III (PORFRU): 2.1 ug/g{creat} (ref <=5.6)

## 2023-11-27 LAB — CREATININE, URINE, RANDOM: Creatinine, Urine: 227 mg/dL (ref 20–275)

## 2023-11-29 LAB — PORPHOBILINOGEN, RANDOM URINE: Porphobilinogen, Rand Ur: 1.1 mg/L (ref 0.0–2.0)

## 2023-11-29 LAB — ALA DELTA, RANDOM URINE: Delta Ala, Ur: 2.1 mg/L (ref 0.0–5.4)

## 2023-11-30 ENCOUNTER — Encounter: Payer: Self-pay | Admitting: Internal Medicine

## 2023-12-01 ENCOUNTER — Telehealth: Payer: Self-pay | Admitting: Internal Medicine

## 2023-12-01 NOTE — Telephone Encounter (Signed)
 Called and spoke to the patient today about the results of her Bravo test (done OFF of PPI therapy), which showed normal total acid exposure of 0.1% and normal DeMeester score of 1.5.  Esophageal exposure to acid reflux is also normal in the upright and supine positions.  There was negative symptom correlation for heartburn, dysphagia, based upon the patient's symptom association probability.  Thus patient's Bravo study was negative for evidence of significant reflux.  Would consider functional heartburn as etiology of the patient's symptoms.  I went over all this with the patient, and she is understanding of all this.  She states that when she was on PPI therapy, she actually felt worse.  I told her that neuromodulators such as citalopram or nortriptyline may be able to help with her symptoms.  Will defer to Dr. Meridee Score about which neuromodulator to try the patient on.  Will CC Dr. Meridee Score to this telephone note.  Full Bravo report will be uploaded shortly.

## 2023-12-03 NOTE — Telephone Encounter (Signed)
 Inbound call from patient, states she has yet to receive dexilant or acid reflux medication. Would like to discuss further options with a nurse.

## 2023-12-03 NOTE — Telephone Encounter (Signed)
 See alternate note from Dr Leonides Schanz to Dr Meridee Score regarding PPI medication

## 2023-12-03 NOTE — Telephone Encounter (Signed)
 CD, Thanks for talking with her and reading this Bravo study. Putting JZ on here, who is going to be seeing her next month in clinic follow-up. Agree with TCA versus SSRI in an effort of seeing if this may be helpful. GM

## 2023-12-07 ENCOUNTER — Encounter (HOSPITAL_COMMUNITY): Payer: Self-pay | Admitting: Emergency Medicine

## 2023-12-07 ENCOUNTER — Telehealth: Payer: Self-pay | Admitting: Gastroenterology

## 2023-12-07 ENCOUNTER — Emergency Department (HOSPITAL_COMMUNITY)

## 2023-12-07 ENCOUNTER — Other Ambulatory Visit: Payer: Self-pay

## 2023-12-07 ENCOUNTER — Emergency Department (HOSPITAL_COMMUNITY)
Admission: EM | Admit: 2023-12-07 | Discharge: 2023-12-08 | Disposition: A | Discharge status: Home / Self Care | Attending: Emergency Medicine | Admitting: Emergency Medicine

## 2023-12-07 DIAGNOSIS — K449 Diaphragmatic hernia without obstruction or gangrene: Secondary | ICD-10-CM | POA: Diagnosis not present

## 2023-12-07 DIAGNOSIS — R110 Nausea: Secondary | ICD-10-CM | POA: Diagnosis not present

## 2023-12-07 DIAGNOSIS — R1013 Epigastric pain: Secondary | ICD-10-CM | POA: Diagnosis not present

## 2023-12-07 DIAGNOSIS — K219 Gastro-esophageal reflux disease without esophagitis: Secondary | ICD-10-CM | POA: Diagnosis not present

## 2023-12-07 DIAGNOSIS — R12 Heartburn: Secondary | ICD-10-CM | POA: Diagnosis not present

## 2023-12-07 LAB — COMPREHENSIVE METABOLIC PANEL
ALT: 9 U/L (ref 0–44)
AST: 15 U/L (ref 15–41)
Albumin: 4.3 g/dL (ref 3.5–5.0)
Alkaline Phosphatase: 50 U/L (ref 38–126)
Anion gap: 10 (ref 5–15)
BUN: 11 mg/dL (ref 6–20)
CO2: 24 mmol/L (ref 22–32)
Calcium: 9.9 mg/dL (ref 8.9–10.3)
Chloride: 102 mmol/L (ref 98–111)
Creatinine, Ser: 1.01 mg/dL — ABNORMAL HIGH (ref 0.44–1.00)
GFR, Estimated: >60 mL/min (ref >60)
Glucose, Bld: 109 mg/dL — ABNORMAL HIGH (ref 70–99)
Potassium: 3.6 mmol/L (ref 3.5–5.1)
Sodium: 136 mmol/L (ref 135–145)
Total Bilirubin: 0.9 mg/dL (ref 0.0–1.2)
Total Protein: 8.6 g/dL — ABNORMAL HIGH (ref 6.5–8.1)

## 2023-12-07 LAB — URINALYSIS, ROUTINE W REFLEX MICROSCOPIC
Bilirubin Urine: NEGATIVE
Glucose, UA: NEGATIVE mg/dL
Hgb urine dipstick: NEGATIVE
Ketones, ur: NEGATIVE mg/dL
Leukocytes,Ua: NEGATIVE
Nitrite: NEGATIVE
Protein, ur: NEGATIVE mg/dL
Specific Gravity, Urine: 1.002 — ABNORMAL LOW (ref 1.005–1.030)
pH: 6 (ref 5.0–8.0)

## 2023-12-07 LAB — CBC
HCT: 39.8 % (ref 36.0–46.0)
Hemoglobin: 12.5 g/dL (ref 12.0–15.0)
MCH: 28.8 pg (ref 26.0–34.0)
MCHC: 31.4 g/dL (ref 30.0–36.0)
MCV: 91.7 fL (ref 80.0–100.0)
Platelets: 270 10*3/uL (ref 150–400)
RBC: 4.34 MIL/uL (ref 3.87–5.11)
RDW: 13.5 % (ref 11.5–15.5)
WBC: 6.5 10*3/uL (ref 4.0–10.5)
nRBC: 0 % (ref 0.0–0.2)

## 2023-12-07 LAB — HCG, SERUM, QUALITATIVE: Preg, Serum: NEGATIVE

## 2023-12-07 LAB — TROPONIN I (HIGH SENSITIVITY): Troponin I (High Sensitivity): 2 ng/L (ref <18)

## 2023-12-07 LAB — LIPASE, BLOOD: Lipase: 28 U/L (ref 11–51)

## 2023-12-07 MED ORDER — PANTOPRAZOLE SODIUM 40 MG IV SOLR
40.0000 mg | Freq: Once | INTRAVENOUS | Status: AC
  Administered 2023-12-07: 40 mg via INTRAVENOUS
  Filled 2023-12-07: qty 10

## 2023-12-07 MED ORDER — SODIUM CHLORIDE 0.9 % IV BOLUS
1000.0000 mL | Freq: Once | INTRAVENOUS | Status: AC
  Administered 2023-12-07: 1000 mL via INTRAVENOUS

## 2023-12-07 MED ORDER — SUCRALFATE 1 G PO TABS
1.0000 g | ORAL_TABLET | Freq: Once | ORAL | Status: AC
  Administered 2023-12-07: 1 g via ORAL
  Filled 2023-12-07: qty 1

## 2023-12-07 MED ORDER — KETOROLAC TROMETHAMINE 15 MG/ML IJ SOLN
15.0000 mg | Freq: Once | INTRAMUSCULAR | Status: AC
  Administered 2023-12-07: 15 mg via INTRAVENOUS
  Filled 2023-12-07: qty 1

## 2023-12-07 MED ORDER — LIDOCAINE VISCOUS HCL 2 % MT SOLN
15.0000 mL | Freq: Once | OROMUCOSAL | Status: AC
  Administered 2023-12-07: 15 mL via ORAL
  Filled 2023-12-07: qty 15

## 2023-12-07 MED ORDER — ALUM & MAG HYDROXIDE-SIMETH 200-200-20 MG/5ML PO SUSP
30.0000 mL | Freq: Once | ORAL | Status: AC
  Administered 2023-12-07: 30 mL via ORAL
  Filled 2023-12-07: qty 30

## 2023-12-07 MED ORDER — IOHEXOL 300 MG/ML  SOLN
100.0000 mL | Freq: Once | INTRAMUSCULAR | Status: AC | PRN
  Administered 2023-12-07: 100 mL via INTRAVENOUS

## 2023-12-07 MED ORDER — ONDANSETRON HCL 4 MG/2ML IJ SOLN
4.0000 mg | Freq: Once | INTRAMUSCULAR | Status: AC
  Administered 2023-12-07: 4 mg via INTRAVENOUS
  Filled 2023-12-07: qty 2

## 2023-12-07 NOTE — Telephone Encounter (Signed)
 I was able to speak with the pt and advise her of the recommendation per Dr Meridee Score.  She will keep the appt with Shanda Bumps as planned and try taking OTC PPI until the appt for reflux symptoms.

## 2023-12-07 NOTE — ED Notes (Signed)
 ED Provider at bedside.

## 2023-12-07 NOTE — Telephone Encounter (Signed)
 Patient called stated she had requested a medication to help with her Genella Rife symptoms and was recommended to take Dexilant but it was too expensive per her insurance and needed PA. She has not heard from anyone about an update and really needs help she said she ended up taking Famotine but it gives her diarrhea. Please advise

## 2023-12-07 NOTE — ED Provider Notes (Signed)
 [place] EMERGENCY DEPARTMENT AT [facility] Provider Note   CSN: [id number] Arrival date & time: 12/07/23  1620     History  Chief Complaint  Patient presents with   Abdominal Pain    Tamara Church is a 48 y.o. female, history of gastritis, who presents to the ED secondary to epigastric pain, this been going on for the last day.  She states she has not been taking her famotidine, but was told to take Prilosec today, she took Prilosec 20 mg, without relief of her symptoms.  The epigastric pain, has been burning, and persistent.  She states she cannot eat or drink because the pain hurts so much.  She denies any current fevers, chills, or lower abdominal pain.  She does endorse some nausea, but no vomiting.  States this feels similar, to her past hiatal hernia problems.    Home Medications Prior to Admission medications   Medication Sig Start Date End Date Taking? Authorizing Provider  amLODipine (NORVASC) 10 MG tablet Take 1 tablet by mouth once daily 11/04/23   Glendale Chard, DO  dexlansoprazole (DEXILANT) 60 MG capsule Take 1 capsule (60 mg total) by mouth daily. 11/19/23   Mansouraty, Netty Starring., MD  folic acid (FOLVITE) 1 MG tablet Take 1 tablet (1 mg total) by mouth daily. Patient not taking: Reported on 11/19/2023 10/13/23   Mansouraty, Netty Starring., MD  hydrOXYzine (ATARAX) 10 MG tablet Take 1 tablet (10 mg total) by mouth 2 (two) times daily. Patient not taking: Reported on 11/19/2023 07/21/23   Mansouraty, Netty Starring., MD  levonorgestrel [facility]) 20 MCG/24HR IUD 1 each by Intrauterine route once.    [provider]  loperamide (IMODIUM) 2 MG capsule Take 1 capsule (2 mg total) by mouth 4 (four) times daily as needed for diarrhea or loose stools. Patient not taking: Reported on 11/19/2023 09/14/23   Arthor Captain, PA-C  ondansetron (ZOFRAN-ODT) 4 MG disintegrating tablet Take 1 tablet (4 mg total) by mouth every 8 (eight) hours as needed. 11/19/23   Mansouraty,  Netty Starring., MD  promethazine (PHENERGAN) 25 MG suppository Place 1 suppository (25 mg total) rectally every 6 (six) hours as needed for nausea or vomiting. Patient not taking: Reported on 11/19/2023 07/24/23   Coral Spikes, DO  spironolactone (ALDACTONE) 25 MG tablet Take 2 tablets (50 mg total) by mouth at bedtime. 09/30/23   Glendale Chard, DO      Allergies    Hctz [hydrochlorothiazide], Ace inhibitors, and Angiotensin receptor blockers    Review of Systems   Review of Systems  Gastrointestinal:  Positive for abdominal pain.    Physical Exam Updated Vital Signs BP (!) 149/94   Pulse 91   Temp 98.9 F (37.2 C) (Oral)   Resp 15   Ht 5\' 1"  (1.549 m)   Wt 77.1 kg   SpO2 100%   BMI 32.12 kg/m  Physical Exam Vitals and nursing note reviewed.  Constitutional:      General: She is not in acute distress.    Appearance: She is well-developed.  HENT:     Head: Normocephalic and atraumatic.  Eyes:     Conjunctiva/sclera: Conjunctivae normal.  Cardiovascular:     Rate and Rhythm: Normal rate and regular rhythm.     Heart sounds: No murmur heard. Pulmonary:     Effort: Pulmonary effort is normal. No respiratory distress.     Breath sounds: Normal breath sounds.  Abdominal:     Palpations: Abdomen is soft.  Tenderness: There is abdominal tenderness in the epigastric area. There is no guarding or rebound.  Musculoskeletal:        General: No swelling.     Cervical back: Neck supple.  Skin:    General: Skin is warm and dry.     Capillary Refill: Capillary refill takes less than 2 seconds.  Neurological:     Mental Status: She is alert.  Psychiatric:        Mood and Affect: Mood normal.     ED Results / Procedures / Treatments   Labs (all labs ordered are listed, but only abnormal results are displayed) Labs Reviewed  COMPREHENSIVE METABOLIC PANEL - Abnormal; Notable for the following components:      Result Value   Glucose, Bld 109 (*)    Creatinine, Ser 1.01 (*)     Total Protein 8.6 (*)    All other components within normal limits  URINALYSIS, ROUTINE W REFLEX MICROSCOPIC - Abnormal; Notable for the following components:   Color, Urine COLORLESS (*)    Specific Gravity, Urine 1.002 (*)    All other components within normal limits  LIPASE, BLOOD  CBC  HCG, SERUM, QUALITATIVE  TROPONIN I (HIGH SENSITIVITY)  TROPONIN I (HIGH SENSITIVITY)    EKG None  Radiology DG Chest 2 View Result Date: 12/07/2023 CLINICAL DATA:  Severe heartburn.  Hiatal hernia. EXAM: CHEST - 2 VIEW COMPARISON:  10/16/2023 FINDINGS: The cardiomediastinal contours are normal. No large hiatal hernia demonstrated by radiograph. Pulmonary vasculature is normal. No consolidation, pleural effusion, or pneumothorax. No acute osseous abnormalities are seen. IMPRESSION: No acute findings. Electronically Signed   By: Narda Rutherford M.D.   On: 12/07/2023 22:00    Procedures Procedures    Medications Ordered in ED Medications  alum & mag hydroxide-simeth (MAALOX/MYLANTA) 200-200-20 MG/5ML suspension 30 mL (30 mLs Oral Given 12/07/23 2210)    And  lidocaine (XYLOCAINE) 2 % viscous mouth solution 15 mL (15 mLs Oral Given 12/07/23 2211)  pantoprazole (PROTONIX) injection 40 mg (40 mg Intravenous Given 12/07/23 2210)  sucralfate (CARAFATE) tablet 1 g (1 g Oral Given 12/07/23 2310)  sodium chloride 0.9 % bolus 1,000 mL (1,000 mLs Intravenous New Bag/Given 12/07/23 2306)  ketorolac (TORADOL) 15 MG/ML injection 15 mg (15 mg Intravenous Given 12/07/23 2307)  ondansetron (ZOFRAN) injection 4 mg (4 mg Intravenous Given 12/07/23 2309)  iohexol (OMNIPAQUE) 300 MG/ML solution 100 mL (100 mLs Intravenous Contrast Given 12/07/23 2318)    ED Course/ Medical Decision Making/ A&P                                 Medical Decision Making Patient is a 48 year old female, here for epigastric pain, and nausea, for the last day.  She states she cannot eat anything, because she her stomach hurt so bad.  She  states it feels similar to her past hiatal hernias, and she has asked to be admitted for this before.  She is overall well-appearing, but is requesting pain medications.  I put in some Carafate, pantoprazole, as well as fluids, and Maalox  Amount and/or Complexity of Data Reviewed Labs: ordered.    Details: Labs unremarkable Radiology: ordered.    Details: Chest xray clear Discussion of management or test interpretation with external provider(s): Patient's chest x-ray is clear, blood work is reassuring, however she is not having relief with any of the meds, given, I please order medication, including Toradol, Zofran, to  help with the pain, and will she will need to be reassessed.  Her CT abd pelvis, is pending at this time.  PA Ariel, will reevaluate patient, and p.o. trial  Risk OTC drugs. Prescription drug management.    Final Clinical Impression(s) / ED Diagnoses Final diagnoses:  None    Rx / DC Orders ED Discharge Orders     None         Matilde Pottenger, Harley [place], PA 12/07/23 2351    Estelle June A, DO 12/13/23 0720

## 2023-12-07 NOTE — ED Triage Notes (Signed)
 Patient comes in POV. States a history of a hiatal hernia. She has had nausea and reflux since yesterday. Took Zofran and Pepcid and Prilosec with little to no relief.

## 2023-12-08 ENCOUNTER — Other Ambulatory Visit (HOSPITAL_COMMUNITY): Payer: Self-pay

## 2023-12-08 MED ORDER — OMEPRAZOLE 20 MG PO CPDR: 20.0000 mg | DELAYED_RELEASE_CAPSULE | Freq: Every day | ORAL | 0 refills | Status: DC

## 2023-12-08 MED ORDER — PANTOPRAZOLE SODIUM 20 MG PO TBEC
20.0000 mg | DELAYED_RELEASE_TABLET | Freq: Every day | ORAL | 0 refills | Status: DC
Start: 2023-12-08 — End: 2023-12-16

## 2023-12-08 NOTE — ED Provider Notes (Signed)
 Accepted handoff at shift change from Small PA-C. Please see prior provider note for more detail.   Briefly: Patient is 48 y.o.   DDX: concern for hiatal hernia, GERD, esophagitis, esophageal spasms, gastritis  Plan: Reassess for medication response and pending CTAP   Physical Exam  BP 137/89   Pulse 84   Temp 98.9 F (37.2 C) (Oral)   Resp 20   Ht 5\' 1"  (1.549 m)   Wt 77.1 kg   SpO2 100%   BMI 32.12 kg/m   Physical Exam Vitals and nursing note reviewed.  Constitutional:      General: She is not in acute distress.    Appearance: She is well-developed.  HENT:     Head: Normocephalic and atraumatic.  Eyes:     Conjunctiva/sclera: Conjunctivae normal.  Cardiovascular:     Rate and Rhythm: Normal rate and regular rhythm.     Heart sounds: No murmur heard. Pulmonary:     Effort: Pulmonary effort is normal. No respiratory distress.     Breath sounds: Normal breath sounds.  Abdominal:     Palpations: Abdomen is soft.     Tenderness: There is abdominal tenderness in the epigastric area. There is no guarding or rebound.  Musculoskeletal:        General: No swelling.     Cervical back: Neck supple.  Skin:    General: Skin is warm and dry.     Capillary Refill: Capillary refill takes less than 2 seconds.  Neurological:     Mental Status: She is alert.  Psychiatric:        Mood and Affect: Mood normal.     Procedures  Procedures  ED Course / MDM    Medical Decision Making Amount and/or Complexity of Data Reviewed Labs: ordered. Radiology: ordered.  Risk OTC drugs. Prescription drug management.   Patient was given a combination of several medications including Zofran, Toradol, viscous lidocaine, Maalox, Protonix, Carafate and a fluid bolus for symptom control.  CT abdomen pelvis is negative for any acute findings.  A small hiatal hernia is noted.  On reassessment, patient reports improvement in her acid reflux type symptoms.  Given improvement, believe the  patient is stable for outpatient follow-up with GI.  Advised patient to contact them for sooner evaluation.  Will send a prescription for Prilosec and Protonix.  Patient otherwise stable at this time and discharged home.       Smitty Knudsen, PA-C 12/08/23 0117    Nira Conn, MD 12/08/23 [phone]

## 2023-12-08 NOTE — ED Notes (Signed)
 ED Provider at bedside.

## 2023-12-08 NOTE — Telephone Encounter (Signed)
 The pt has been advised and will pick up the prescription as soon as she is able. The pharmacy advised that the medication is on back order.

## 2023-12-08 NOTE — Discharge Instructions (Signed)
 You are seen in the emergency department today for concerns of epigastric abdominal pain.  Your labs and imaging were thankfully reassuring and you do still currently.  Have a small hiatal hernia.  You seem to have some mild improvement with a combination of [name] medications.  I sent a prescription for Protonix and Prevacid to your pharmacy.  Please take these medication as prescribed.  I would strongly advise that you reach out to your GI team once again and try to be seen sooner.  Return the emergency department for any concerns of new or worsening symptoms.

## 2023-12-09 ENCOUNTER — Encounter: Payer: Self-pay | Admitting: Internal Medicine

## 2023-12-16 ENCOUNTER — Other Ambulatory Visit: Payer: Self-pay | Admitting: Nurse Practitioner

## 2023-12-16 NOTE — Telephone Encounter (Signed)
 Called patient.  She said she originally could not get Dexilant. But then she said it was approved but [facility] said it was on back order. So she has been taking famotidine BID. She said originally it caused bad headaches and diarrhea but it has "settled down" now and she is tolerating it better.  She said she still doesn't have an appetite and has early satiety.  Trying to eat 3 small meals a day but is still Losing weight.  She has lost 20 lbs (190 to 170lbs).  Patient was encouraged to see if she can get Dexilant now and try that until her upcoming appointment with Doug Sou on 4-14. Sending refills on pepcid BID incase she can't get dexilant

## 2024-01-03 ENCOUNTER — Ambulatory Visit (INDEPENDENT_AMBULATORY_CARE_PROVIDER_SITE_OTHER): Payer: Medicaid Other | Admitting: Gastroenterology

## 2024-01-03 ENCOUNTER — Encounter: Payer: Self-pay | Admitting: Gastroenterology

## 2024-01-03 VITALS — BP 126/68 | HR 68 | Ht 61.0 in | Wt 180.0 lb

## 2024-01-03 DIAGNOSIS — R109 Unspecified abdominal pain: Secondary | ICD-10-CM | POA: Diagnosis not present

## 2024-01-03 DIAGNOSIS — G8929 Other chronic pain: Secondary | ICD-10-CM

## 2024-01-03 DIAGNOSIS — R1114 Bilious vomiting: Secondary | ICD-10-CM | POA: Diagnosis not present

## 2024-01-03 DIAGNOSIS — R110 Nausea: Secondary | ICD-10-CM | POA: Diagnosis not present

## 2024-01-03 DIAGNOSIS — K219 Gastro-esophageal reflux disease without esophagitis: Secondary | ICD-10-CM | POA: Diagnosis not present

## 2024-01-03 NOTE — Patient Instructions (Signed)
 Continue Dexilant.   Follow up in 4-6 months or sooner if needed.   _______________________________________________________  If your blood pressure at your visit was 140/90 or greater, please contact your primary care physician to follow up on this.  _______________________________________________________  If you are age 47 or older, your body mass index should be between 23-30. Your Body mass index is 34.01 kg/m. If this is out of the aforementioned range listed, please consider follow up with your Primary Care Provider.  If you are age 56 or younger, your body mass index should be between 19-25. Your Body mass index is 34.01 kg/m. If this is out of the aformentioned range listed, please consider follow up with your Primary Care Provider.   ________________________________________________________  The [facility] providers would like to encourage you to use MYCHART to communicate with providers for non-urgent requests or questions.  Due to long hold times on the telephone, sending your provider a message by [facility] may be a faster and more efficient way to get a response.  Please allow 48 business hours for a response.  Please remember that this is for non-urgent requests.  _______________________________________________________

## 2024-01-03 NOTE — Progress Notes (Addendum)
 01/03/2024 Tamara Church [id number] 21-Oct-1975   HISTORY OF PRESENT ILLNESS: This is a 48 year old female who is a patient of Dr. Elesa Hacker.  Past medical history of hypertension, anemia, anxiety, cholelithiasis status post CCK, hiatal hernia, GERD, recurrent abdominal pain/nausea/vomiting episodes.  She is here for follow-up.  She was started on Dexilant 60 mg daily when she last saw Dr. Meridee Score in February.  Says that this is working really well for her.  She has not had any recurrent symptoms or at least very rare since beginning this medication.  That is the only mediation that she is taking for her GI issues at this time.  Says that she is eating without issues.  Says that [facility] never contacted her for an appointment to see them.  2/25 EGD Bravo - Normal esophagus. - 2 cm hiatal hernia. - One gastric polyp. Resected and retrieved. - Normal examined duodenum. - The BRAVO pH capsule was deployed.   Esophageal Manometry  Bravo test (done OFF of PPI therapy), which showed normal total acid exposure of 0.1% and normal DeMeester score of 1.5.  Esophageal exposure to acid reflux is also normal in the upright and supine positions.  There was negative symptom correlation for heartburn, dysphagia, based upon the patient's symptom association probability.  Thus patient's Bravo study was negative for evidence of significant reflux.  Would consider functional heartburn as etiology of the patient's symptoms.   2024 Colonoscopy - Hemorrhoids found on digital rectal exam. - The examined portion of the ileum was normal. - Normal mucosa in the entire examined colon. - Non-bleeding non-thrombosed internal hemorrhoids   2024 EGD - No gross lesions in the entire esophagus. Biopsied. - Z-line irregular, 39 cm from the incisors. - 2 cm hiatal hernia. - Erythematous mucosa in the stomach. Biopsied. - No gross lesions in the duodenal bulb, in the first portion of the duodenum and in the second  portion of the duodenum.  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   Gastritis and gastroduodenitis 07/27/2020   Past Surgical History:  Procedure Laterality Date   13 HOUR PH STUDY N/A 08/25/2023   Procedure: 24 HOUR PH STUDY;  Surgeon: Lemar Lofty., MD;  Location: [facility];  Service: Gastroenterology;  Laterality: N/A;   BIOPSY  05/26/2023   Procedure: BIOPSY;  Surgeon: Lemar Lofty., MD;  Location: [facility];  Service: Gastroenterology;;   CHOLECYSTECTOMY  09/2022   COLONOSCOPY  05/2023   ESOPHAGEAL MANOMETRY N/A 08/25/2023   Procedure: ESOPHAGEAL MANOMETRY (EM);  Surgeon: Lemar Lofty., MD;  Location: [facility];  Service: Gastroenterology;  Laterality: N/A;   ESOPHAGOGASTRODUODENOSCOPY (EGD) WITH PROPOFOL N/A 05/26/2023   Procedure: ESOPHAGOGASTRODUODENOSCOPY (EGD) WITH PROPOFOL;  Surgeon: Meridee Score Netty Starring., MD;  Location: [facility];  Service: Gastroenterology;  Laterality: N/A;   NO PAST SURGERIES     UPPER GASTROINTESTINAL ENDOSCOPY      reports that she has never smoked. She has never used smokeless tobacco. She reports that she does not drink alcohol and does not use drugs. family history includes Hypertension in her mother. Allergies  Allergen Reactions   Hctz [Hydrochlorothiazide] Other (See Comments)    hypokalemia   Ace Inhibitors Cough   Angiotensin Receptor Blockers Other (See Comments)    cough      Outpatient Encounter Medications as of 01/03/2024  Medication Sig   amLODipine (NORVASC) 10 MG tablet  Take 1 tablet by mouth once daily   dexlansoprazole (DEXILANT) 60 MG capsule Take 1 capsule (60 mg total) by mouth daily.   folic acid (FOLVITE) 1 MG tablet Take 1 tablet (1 mg total) by mouth daily.   spironolactone (ALDACTONE) 25 MG tablet Take 2 tablets (50 mg total) by mouth at bedtime.    famotidine (PEPCID) 20 MG tablet Take 1 tablet by mouth twice daily (Patient not taking: Reported on 01/03/2024)   hydrOXYzine (ATARAX) 10 MG tablet Take 1 tablet (10 mg total) by mouth 2 (two) times daily. (Patient not taking: Reported on 01/03/2024)   levonorgestrel (MIRENA) 20 MCG/24HR IUD 1 each by Intrauterine route once. (Patient not taking: Reported on 01/03/2024)   loperamide (IMODIUM) 2 MG capsule Take 1 capsule (2 mg total) by mouth 4 (four) times daily as needed for diarrhea or loose stools. (Patient not taking: Reported on 01/03/2024)   ondansetron (ZOFRAN-ODT) 4 MG disintegrating tablet Take 1 tablet (4 mg total) by mouth every 8 (eight) hours as needed. (Patient not taking: Reported on 01/03/2024)   promethazine (PHENERGAN) 25 MG suppository Place 1 suppository (25 mg total) rectally every 6 (six) hours as needed for nausea or vomiting. (Patient not taking: Reported on 01/03/2024)   No facility-administered encounter medications on file as of 01/03/2024.    REVIEW OF SYSTEMS  : All other systems reviewed and negative except where noted in the History of Present Illness.   PHYSICAL EXAM: BP 126/68   Pulse 68   Ht 5\' 1"  (1.549 m)   Wt 180 lb (81.6 kg)   BMI 34.01 kg/m  General: Well developed female in no acute distress Head: Normocephalic and atraumatic Eyes:  Sclerae anicteric, conjunctiva pink. Ears: Normal auditory acuity Lungs: Clear throughout to auscultation; no W/R/R. Heart: Regular rate and rhythm; no M/R/G. Musculoskeletal: Symmetrical with no gross deformities  Skin: No lesions on visible extremities Neurological: Alert oriented x 4, grossly non-focal Psychological:  Alert and cooperative. Normal mood and affect  ASSESSMENT AND PLAN: 48 year old female here for follow-up of her GERD, chronic abdominal pain, bilious vomiting with nausea.  Doing well since switching to Dexilant 60 mg daily.  Currently it is the only medication that she is taking for her GI symptoms.   Bravo pH study unremarkable.  Had a second opinion referral placed to [facility], but she says they never contacted her.  Since she is doing very well we will hold off on proceeding with that for now.  Other options in the future (especially for second opinion is unremarkable) then could consider trial of a TCA such as amitriptyline or nortriptyline versus SSRI like citalopram for chronic functional symptoms/dyspepsia.  Also could try cholestyramine for bile gastritis/reflux.  Will follow-up with me or Dr. Brice Campi in 4 to 6 months.   CC:  Clem Currier, DO

## 2024-01-04 ENCOUNTER — Encounter (HOSPITAL_BASED_OUTPATIENT_CLINIC_OR_DEPARTMENT_OTHER): Payer: Self-pay | Admitting: Emergency Medicine

## 2024-01-04 ENCOUNTER — Other Ambulatory Visit: Payer: Self-pay

## 2024-01-04 ENCOUNTER — Emergency Department (HOSPITAL_BASED_OUTPATIENT_CLINIC_OR_DEPARTMENT_OTHER)
Admission: EM | Admit: 2024-01-04 | Discharge: 2024-01-04 | Disposition: A | Discharge status: Home / Self Care | Attending: Emergency Medicine | Admitting: Emergency Medicine

## 2024-01-04 ENCOUNTER — Emergency Department (HOSPITAL_BASED_OUTPATIENT_CLINIC_OR_DEPARTMENT_OTHER): Admitting: Radiology

## 2024-01-04 ENCOUNTER — Emergency Department (HOSPITAL_BASED_OUTPATIENT_CLINIC_OR_DEPARTMENT_OTHER)

## 2024-01-04 DIAGNOSIS — K449 Diaphragmatic hernia without obstruction or gangrene: Secondary | ICD-10-CM | POA: Insufficient documentation

## 2024-01-04 DIAGNOSIS — N3289 Other specified disorders of bladder: Secondary | ICD-10-CM | POA: Diagnosis not present

## 2024-01-04 DIAGNOSIS — K219 Gastro-esophageal reflux disease without esophagitis: Secondary | ICD-10-CM | POA: Diagnosis not present

## 2024-01-04 DIAGNOSIS — R109 Unspecified abdominal pain: Secondary | ICD-10-CM | POA: Diagnosis not present

## 2024-01-04 DIAGNOSIS — R079 Chest pain, unspecified: Secondary | ICD-10-CM | POA: Diagnosis not present

## 2024-01-04 DIAGNOSIS — R1012 Left upper quadrant pain: Secondary | ICD-10-CM

## 2024-01-04 DIAGNOSIS — N281 Cyst of kidney, acquired: Secondary | ICD-10-CM | POA: Diagnosis not present

## 2024-01-04 LAB — CBC
HCT: 36.3 % (ref 36.0–46.0)
Hemoglobin: 12.1 g/dL (ref 12.0–15.0)
MCH: 29.3 pg (ref 26.0–34.0)
MCHC: 33.3 g/dL (ref 30.0–36.0)
MCV: 87.9 fL (ref 80.0–100.0)
Platelets: 240 10*3/uL (ref 150–400)
RBC: 4.13 MIL/uL (ref 3.87–5.11)
RDW: 14 % (ref 11.5–15.5)
WBC: 9.4 10*3/uL (ref 4.0–10.5)
nRBC: 0 % (ref 0.0–0.2)

## 2024-01-04 LAB — COMPREHENSIVE METABOLIC PANEL WITH GFR
ALT: 14 U/L (ref 0–44)
AST: 15 U/L (ref 15–41)
Albumin: 4.3 g/dL (ref 3.5–5.0)
Alkaline Phosphatase: 51 U/L (ref 38–126)
Anion gap: 9 (ref 5–15)
BUN: 14 mg/dL (ref 6–20)
CO2: 25 mmol/L (ref 22–32)
Calcium: 10.1 mg/dL (ref 8.9–10.3)
Chloride: 103 mmol/L (ref 98–111)
Creatinine, Ser: 0.8 mg/dL (ref 0.44–1.00)
GFR, Estimated: >60 mL/min (ref >60)
Glucose, Bld: 128 mg/dL — ABNORMAL HIGH (ref 70–99)
Potassium: 3.8 mmol/L (ref 3.5–5.1)
Sodium: 137 mmol/L (ref 135–145)
Total Bilirubin: 0.4 mg/dL (ref 0.0–1.2)
Total Protein: 8.2 g/dL — ABNORMAL HIGH (ref 6.5–8.1)

## 2024-01-04 LAB — TROPONIN I (HIGH SENSITIVITY)
Troponin I (High Sensitivity): 3 ng/L (ref <18)
Troponin I (High Sensitivity): 3 ng/L (ref <18)

## 2024-01-04 LAB — D-DIMER, QUANTITATIVE: D-Dimer, Quant: <0.27 ug{FEU}/mL (ref 0.00–0.50)

## 2024-01-04 LAB — LIPASE, BLOOD: Lipase: 19 U/L (ref 11–51)

## 2024-01-04 LAB — HCG, SERUM, QUALITATIVE: Preg, Serum: NEGATIVE

## 2024-01-04 MED ORDER — FENTANYL CITRATE PF 50 MCG/ML IJ SOSY
50.0000 ug | PREFILLED_SYRINGE | Freq: Once | INTRAMUSCULAR | Status: AC
  Administered 2024-01-04: 50 ug via INTRAVENOUS
  Filled 2024-01-04: qty 1

## 2024-01-04 MED ORDER — LACTATED RINGERS IV BOLUS
1000.0000 mL | Freq: Once | INTRAVENOUS | Status: AC
  Administered 2024-01-04: 1000 mL via INTRAVENOUS

## 2024-01-04 MED ORDER — FAMOTIDINE IN NACL 20-0.9 MG/50ML-% IV SOLN
20.0000 mg | Freq: Once | INTRAVENOUS | Status: AC
  Administered 2024-01-04: 20 mg via INTRAVENOUS
  Filled 2024-01-04: qty 50

## 2024-01-04 MED ORDER — LIDOCAINE VISCOUS HCL 2 % MT SOLN
15.0000 mL | Freq: Once | OROMUCOSAL | Status: AC
  Administered 2024-01-04: 15 mL via ORAL
  Filled 2024-01-04: qty 15

## 2024-01-04 MED ORDER — IOHEXOL 300 MG/ML  SOLN
100.0000 mL | Freq: Once | INTRAMUSCULAR | Status: AC | PRN
  Administered 2024-01-04: 100 mL via INTRAVENOUS

## 2024-01-04 MED ORDER — ALUM & MAG HYDROXIDE-SIMETH 200-200-20 MG/5ML PO SUSP
30.0000 mL | Freq: Once | ORAL | Status: AC
  Administered 2024-01-04: 30 mL via ORAL
  Filled 2024-01-04: qty 30

## 2024-01-04 MED ORDER — MORPHINE SULFATE (PF) 4 MG/ML IV SOLN: 4.0000 mg | Freq: Once | INTRAVENOUS | Status: DC

## 2024-01-04 MED ORDER — DROPERIDOL 2.5 MG/ML IJ SOLN
2.5000 mg | Freq: Once | INTRAMUSCULAR | Status: AC
  Administered 2024-01-04: 2.5 mg via INTRAVENOUS
  Filled 2024-01-04: qty 2

## 2024-01-04 MED ORDER — ONDANSETRON HCL 4 MG/2ML IJ SOLN
4.0000 mg | Freq: Once | INTRAMUSCULAR | Status: AC
Start: 2024-01-04 — End: 2024-01-04
  Administered 2024-01-04: 4 mg via INTRAVENOUS
  Filled 2024-01-04: qty 2

## 2024-01-04 NOTE — ED Notes (Signed)
 Patient refused morphine stating "its too strong for me". Provider notified.,

## 2024-01-04 NOTE — ED Triage Notes (Signed)
 C/o GERD symptoms since 0400 this morning. States she thinks its something she ate last night. Hx of hernia.

## 2024-01-04 NOTE — ED Provider Notes (Signed)
 [place] EMERGENCY DEPARTMENT AT [facility] Provider Note   CSN: [id number] Arrival date & time: 01/04/24  0706     History  Chief Complaint  Patient presents with   Gastroesophageal Reflux    Tamara Church is a 48 y.o. female.   Gastroesophageal Reflux Associated symptoms include chest pain and abdominal pain.     48 year old female with medical history significant for gastritis, gastroduodenitis, gallstones, anxiety, hiatal hernia presenting to the emergency department with left upper quadrant abdominal pain as well as chest pain.  The patient states that around 4:00 this morning she woke up with left upper quadrant pain with associated nausea.  Her last bowel movement was this morning and she is currently passing gas.  Pain is moderate in severity.  She also endorses chest discomfort radiating across her chest, not to the back, no shortness of breath.  She has not tried eating anything and has had to had decreased p.o. intake.  She denies any genitourinary symptoms.  The patient was last seen by [facility] gastro[facility]ology in clinic yesterday.  She was started on Dexilant 60 mg daily and it had been working well for her.  She last underwent EGD 2/25 with a normal esophagus and 2 cm hiatal hernia with 1 gastric polyp present which was resected and retrieved with normal examined duodenum.  The patient underwent Bravo study which was negative correlation for heartburn dysphagia with negative for evidence of significant reflux.  The patient denies any cannabis use or THC product use.  Home Medications Prior to Admission medications   Medication Sig Start Date End Date Taking? Authorizing Provider  amLODipine (NORVASC) 10 MG tablet Take 1 tablet by mouth once daily 11/04/23   Glendale Chard, DO  dexlansoprazole (DEXILANT) 60 MG capsule Take 1 capsule (60 mg total) by mouth daily. 11/19/23   Mansouraty, Netty Starring., MD  famotidine (PEPCID) 20 MG tablet Take 1 tablet by mouth twice  daily Patient not taking: Reported on 01/03/2024 12/16/23   Mansouraty, Netty Starring., MD  folic acid (FOLVITE) 1 MG tablet Take 1 tablet (1 mg total) by mouth daily. 10/13/23   Mansouraty, Netty Starring., MD  hydrOXYzine (ATARAX) 10 MG tablet Take 1 tablet (10 mg total) by mouth 2 (two) times daily. Patient not taking: Reported on 01/03/2024 07/21/23   Mansouraty, Netty Starring., MD  loperamide (IMODIUM) 2 MG capsule Take 1 capsule (2 mg total) by mouth 4 (four) times daily as needed for diarrhea or loose stools. Patient not taking: Reported on 01/03/2024 09/14/23   Arthor Captain, PA-C  ondansetron (ZOFRAN-ODT) 4 MG disintegrating tablet Take 1 tablet (4 mg total) by mouth every 8 (eight) hours as needed. Patient not taking: Reported on 01/03/2024 11/19/23   Mansouraty, Netty Starring., MD  promethazine (PHENERGAN) 25 MG suppository Place 1 suppository (25 mg total) rectally every 6 (six) hours as needed for nausea or vomiting. Patient not taking: Reported on 01/03/2024 07/24/23   Coral Spikes, DO  spironolactone (ALDACTONE) 25 MG tablet Take 2 tablets (50 mg total) by mouth at bedtime. 09/30/23   Glendale Chard, DO      Allergies    Hctz [hydrochlorothiazide], Ace inhibitors, and Angiotensin receptor blockers    Review of Systems   Review of Systems  Cardiovascular:  Positive for chest pain.  Gastrointestinal:  Positive for abdominal pain and nausea.  All other systems reviewed and are negative.   Physical Exam Updated Vital Signs BP 113/70   Pulse 72   Temp 98 F (36.7  C) (Oral)   Resp 18   SpO2 96%  Physical Exam Vitals and nursing note reviewed.  Constitutional:      General: She is not in acute distress.    Appearance: She is well-developed.  HENT:     Head: Normocephalic and atraumatic.  Eyes:     Conjunctiva/sclera: Conjunctivae normal.  Cardiovascular:     Rate and Rhythm: Normal rate and regular rhythm.     Heart sounds: No murmur heard. Pulmonary:     Effort: Pulmonary effort is  normal. No respiratory distress.     Breath sounds: Normal breath sounds.  Abdominal:     Palpations: Abdomen is soft.     Tenderness: There is abdominal tenderness in the left upper quadrant. There is no guarding or rebound.  Musculoskeletal:        General: No swelling.     Cervical back: Neck supple.  Skin:    General: Skin is warm and dry.     Capillary Refill: Capillary refill takes less than 2 seconds.  Neurological:     Mental Status: She is alert.  Psychiatric:        Mood and Affect: Mood normal.     ED Results / Procedures / Treatments   Labs (all labs ordered are listed, but only abnormal results are displayed) Labs Reviewed  COMPREHENSIVE METABOLIC PANEL WITH GFR - Abnormal; Notable for the following components:      Result Value   Glucose, Bld 128 (*)    Total Protein 8.2 (*)    All other components within normal limits  CBC  LIPASE, BLOOD  D-DIMER, QUANTITATIVE  HCG, SERUM, QUALITATIVE  TROPONIN I (HIGH SENSITIVITY)  TROPONIN I (HIGH SENSITIVITY)    EKG EKG Interpretation Date/Time:  Tuesday January 04 2024 07:56:06 EDT Ventricular Rate:  96 PR Interval:  162 QRS Duration:  97 QT Interval:  354 QTC Calculation: 448 R Axis:   39  Text Interpretation: Sinus rhythm Low voltage, precordial leads Confirmed by Rosealee Concha ([id number]) on 01/04/2024 7:58:29 AM  Radiology CT ABDOMEN PELVIS W CONTRAST Result Date: 01/04/2024 CLINICAL DATA:  Abdominal pain, acute, nonlocalized EXAM: CT ABDOMEN AND PELVIS WITH CONTRAST TECHNIQUE: Multidetector CT imaging of the abdomen and pelvis was performed using the standard protocol following bolus administration of intravenous contrast. RADIATION DOSE REDUCTION: This exam was performed according to the departmental dose-optimization program which includes automated exposure control, adjustment of the mA and/or kV according to patient size and/or use of iterative reconstruction technique. CONTRAST:  OMNIPAQUE IOHEXOL 300 MG/ML   SOLN COMPARISON:  December 07, 2023, July 23, 2023 FINDINGS: Lower chest: No focal airspace consolidation or pleural effusion. Hepatobiliary: No mass.Cholecystectomy.No intrahepatic or extrahepatic biliary ductal dilation.The portal veins are patent. Pancreas: No mass or main ductal dilation.No peripancreatic inflammation or fluid collection. Spleen: Normal size. No mass. Adrenals/Urinary Tract: No adrenal masses. Similar appearance of multiple bilateral renal cysts. No nephrolithiasis or hydronephrosis. Circumferential wall thickening of the urinary bladder. Stomach/Bowel: The stomach is decompressed without focal abnormality. No small bowel wall thickening or inflammation. No small bowel obstruction. Normal appendix. Vascular/Lymphatic: No aortic aneurysm. No intraabdominal or pelvic lymphadenopathy. Reproductive: The uterus and ovaries are within normal limits for patient's age. 2.9 cm dominant follicle in the right ovary. Intrauterine device is well positioned in the upper endometrium.No free pelvic fluid. Other: No pneumoperitoneum, ascites, or mesenteric inflammation. Musculoskeletal: No acute fracture or destructive lesion. IMPRESSION: 1. Circumferential wall thickening of the urinary bladder, which may be due to underdistension.  If there is concern for acute cystitis, correlation with urinalysis would be recommended. 2. Dominant follicle in the right ovary measuring 2.9 cm. Electronically Signed   By: Wallie Char M.D.   On: 01/04/2024 13:51   DG Abdomen Acute W/Chest Result Date: 01/04/2024 CLINICAL DATA:  Chest pain.  Abdominal pain. EXAM: DG ABDOMEN ACUTE WITH 1 VIEW CHEST COMPARISON:  Chest radiographs 12/07/2023, CT abdomen pelvis 12/07/2023 FINDINGS: Cardiac silhouette mediastinal contours are within limits. The small sliding hiatal hernia seen on recent CT is again not well visualized on radiograph. The lungs are clear. No pleural effusion or thorax. Minimal levocurvature centered at the  thoracolumbar junction. No dilated loops of bowel to indicate bowel obstruction. No significant stool burden. No portal venous gas or pneumatosis. Cholecystectomy clips. No subdiaphragmatic free air. Moderate bilateral superolateral acetabular degenerative osteophytes. Minimal dextrocurvature centered at L3. IMPRESSION: 1. No acute cardiopulmonary process. 2. Nonobstructive bowel gas pattern. Electronically Signed   By: Neita Garnet M.D.   On: 01/04/2024 09:26    Procedures Procedures    Medications Ordered in ED Medications  lactated ringers bolus 1,000 mL (0 mLs Intravenous Stopped 01/04/24 1014)  ondansetron (ZOFRAN) injection 4 mg (4 mg Intravenous Given 01/04/24 0901)  famotidine (PEPCID) IVPB 20 mg premix (0 mg Intravenous Stopped 01/04/24 0930)  alum & mag hydroxide-simeth (MAALOX/MYLANTA) 200-200-20 MG/5ML suspension 30 mL (30 mLs Oral Given 01/04/24 1012)    And  lidocaine (XYLOCAINE) 2 % viscous mouth solution 15 mL (15 mLs Oral Given 01/04/24 1012)  droperidol (INAPSINE) 2.5 MG/ML injection 2.5 mg (2.5 mg Intravenous Given 01/04/24 1125)  fentaNYL (SUBLIMAZE) injection 50 mcg (50 mcg Intravenous Given 01/04/24 1125)  iohexol (OMNIPAQUE) 300 MG/ML solution 100 mL (100 mLs Intravenous Contrast Given 01/04/24 1135)    ED Course/ Medical Decision Making/ A&P                                 Medical Decision Making Amount and/or Complexity of Data Reviewed Labs: ordered. Radiology: ordered.  Risk OTC drugs. Prescription drug management.    48 year old female with medical history significant for gastritis, gastroduodenitis, gallstones, anxiety, hiatal hernia presenting to the emergency department with left upper quadrant abdominal pain as well as chest pain.  The patient states that around 4:00 this morning she woke up with left upper quadrant pain with associated nausea.  Her last bowel movement was this morning and she is currently passing gas.  Pain is moderate in severity.  She  also endorses chest discomfort radiating across her chest, not to the back, no shortness of breath.  She has not tried eating anything and has had to had decreased p.o. intake.  She denies any genitourinary symptoms.  The patient was last seen by [facility] gastroenterology in clinic yesterday.  She was started on Dexilant 60 mg daily and it had been working well for her.  She last underwent EGD 2/25 with a normal esophagus and 2 cm hiatal hernia with 1 gastric polyp present which was resected and retrieved with normal examined duodenum.  The patient underwent Bravo study which was negative correlation for heartburn dysphagia with negative for evidence of significant reflux.  The patient denies any cannabis use or THC product use.  On arrival, the patient was afebrile, temperature 98, tachycardic heart rate 104, not tachypneic RR 18, BP 142/81, saturating 99% on room air.  Physical exam revealed left upper quadrant tenderness to palpation, no rebound or  guarding, patient actively burping on exam.  Lungs clear to auscultation bilaterally.  My primary concern is for symptoms associated with the patient's hiatal hernia, GERD, gastritis.  Lower concern for small bowel obstruction, patient moving her bowels, currently passing gas.  Considered MI/ACS.  Considered PE although feel less likely based on symptoms and presentation.  Initial EKG was performed revealed sinus rhythm, ventricular rate 96, no acute ischemic changes noted, no significantly abnormal intervals.  Chest x-ray and abdominal x-ray:  IMPRESSION:  1. No acute cardiopulmonary process.  2. Nonobstructive bowel gas pattern.    Labs: hCG negative, D-dimer negative, cardiac troponin normal, lipase normal, CBC without a leukocytosis or anemia, CMP without significant electrolyte abnormality, normal renal and liver function  The patient was administered an IV fluid bolus and IV morphine for pain control and IV Zofran for nausea control.  The patient was  administered a [name] with improvement in symptoms.  She was able to tolerate oral intake.  She did have ongoing pain and CT abdomen pelvis was obtained which revealed the following: IMPRESSION:  1. Circumferential wall thickening of the urinary bladder, which may  be due to underdistension. If there is concern for acute cystitis,  correlation with urinalysis would be recommended.  2. Dominant follicle in the right ovary measuring 2.9 cm.   The patient has no symptoms of urinary tract infection.  She denies any dysuria or increased urinary frequency.  She denies any hematuria, no suprapubic pain and she has no tenderness in the suprapubic region on exam.  Low concern for UTI at this time.  Suspect likely symptoms associated with the patient's hiatal hernia and gastritis/GERD.  Low concern for bowel obstruction or other acute intra-abdominal abnormality at this time.  Advised continued outpatient follow-up, return precautions provided in the event of any severe worsening symptoms.  Final Clinical Impression(s) / ED Diagnoses Final diagnoses:  LUQ pain  Hiatal hernia  Gastroesophageal reflux disease, unspecified whether esophagitis present    Rx / DC Orders ED Discharge Orders     None         Rosealee Concha, MD 01/04/24 1359

## 2024-01-04 NOTE — Discharge Instructions (Addendum)
 Please follow-up with your gastroenterologist for further outpatient evaluation.  Your symptoms improved after [name] and your overall x-ray, EKG and screening laboratory evaluation was reassuring.   CT results: IMPRESSION:  1. Circumferential wall thickening of the urinary bladder, which may  be due to underdistension. If there is concern for acute cystitis,  correlation with urinalysis would be recommended.  2. Dominant follicle in the right ovary measuring 2.9 cm.    As you had no suprapubic tenderness and no symptoms of urinary tract infection we deferred workup for UTI at this time.  If you develop worsening symptoms of burning while urinating, increased urinary frequency or suprapubic pain, urinalysis would be indicated.  Follow-up with your gastroenterologist and your PCP.

## 2024-01-05 ENCOUNTER — Emergency Department (HOSPITAL_COMMUNITY)
Admission: EM | Admit: 2024-01-05 | Discharge: 2024-01-06 | Disposition: A | Discharge status: Home / Self Care | Attending: Emergency Medicine | Admitting: Emergency Medicine

## 2024-01-05 ENCOUNTER — Encounter (HOSPITAL_COMMUNITY): Payer: Self-pay

## 2024-01-05 ENCOUNTER — Other Ambulatory Visit: Payer: Self-pay

## 2024-01-05 DIAGNOSIS — K219 Gastro-esophageal reflux disease without esophagitis: Secondary | ICD-10-CM | POA: Insufficient documentation

## 2024-01-05 DIAGNOSIS — R1013 Epigastric pain: Secondary | ICD-10-CM | POA: Diagnosis present

## 2024-01-05 DIAGNOSIS — E871 Hypo-osmolality and hyponatremia: Secondary | ICD-10-CM | POA: Insufficient documentation

## 2024-01-05 DIAGNOSIS — R1084 Generalized abdominal pain: Secondary | ICD-10-CM

## 2024-01-05 DIAGNOSIS — R197 Diarrhea, unspecified: Secondary | ICD-10-CM | POA: Diagnosis not present

## 2024-01-05 DIAGNOSIS — R112 Nausea with vomiting, unspecified: Secondary | ICD-10-CM | POA: Diagnosis not present

## 2024-01-05 LAB — COMPREHENSIVE METABOLIC PANEL WITH GFR
ALT: 21 U/L (ref 0–44)
AST: 22 U/L (ref 15–41)
Albumin: 4.4 g/dL (ref 3.5–5.0)
Alkaline Phosphatase: 50 U/L (ref 38–126)
Anion gap: 11 (ref 5–15)
BUN: 11 mg/dL (ref 6–20)
CO2: 22 mmol/L (ref 22–32)
Calcium: 9.7 mg/dL (ref 8.9–10.3)
Chloride: 101 mmol/L (ref 98–111)
Creatinine, Ser: 0.87 mg/dL (ref 0.44–1.00)
GFR, Estimated: >60 mL/min (ref >60)
Glucose, Bld: 105 mg/dL — ABNORMAL HIGH (ref 70–99)
Potassium: 3.5 mmol/L (ref 3.5–5.1)
Sodium: 134 mmol/L — ABNORMAL LOW (ref 135–145)
Total Bilirubin: 0.8 mg/dL (ref 0.0–1.2)
Total Protein: 8.8 g/dL — ABNORMAL HIGH (ref 6.5–8.1)

## 2024-01-05 LAB — CBC
HCT: 36.9 % (ref 36.0–46.0)
Hemoglobin: 12.2 g/dL (ref 12.0–15.0)
MCH: 29.5 pg (ref 26.0–34.0)
MCHC: 33.1 g/dL (ref 30.0–36.0)
MCV: 89.1 fL (ref 80.0–100.0)
Platelets: 256 10*3/uL (ref 150–400)
RBC: 4.14 MIL/uL (ref 3.87–5.11)
RDW: 13.9 % (ref 11.5–15.5)
WBC: 7.1 10*3/uL (ref 4.0–10.5)
nRBC: 0 % (ref 0.0–0.2)

## 2024-01-05 LAB — URINALYSIS, ROUTINE W REFLEX MICROSCOPIC
Bilirubin Urine: NEGATIVE
Glucose, UA: NEGATIVE mg/dL
Hgb urine dipstick: NEGATIVE
Ketones, ur: 20 mg/dL — AB
Leukocytes,Ua: NEGATIVE
Nitrite: NEGATIVE
Protein, ur: NEGATIVE mg/dL
Specific Gravity, Urine: 1.013 (ref 1.005–1.030)
pH: 5 (ref 5.0–8.0)

## 2024-01-05 LAB — HCG, SERUM, QUALITATIVE: Preg, Serum: NEGATIVE

## 2024-01-05 LAB — LIPASE, BLOOD: Lipase: 26 U/L (ref 11–51)

## 2024-01-05 MED ORDER — PANTOPRAZOLE SODIUM 40 MG IV SOLR
40.0000 mg | Freq: Once | INTRAVENOUS | Status: AC
  Administered 2024-01-06: 40 mg via INTRAVENOUS
  Filled 2024-01-05: qty 10

## 2024-01-05 MED ORDER — LIDOCAINE VISCOUS HCL 2 % MT SOLN
15.0000 mL | Freq: Once | OROMUCOSAL | Status: AC
  Administered 2024-01-06: 15 mL via ORAL
  Filled 2024-01-05: qty 15

## 2024-01-05 MED ORDER — DROPERIDOL 2.5 MG/ML IJ SOLN
1.2500 mg | Freq: Once | INTRAMUSCULAR | Status: AC
  Administered 2024-01-06: 1.25 mg via INTRAVENOUS
  Filled 2024-01-05: qty 2

## 2024-01-05 MED ORDER — METOCLOPRAMIDE HCL 5 MG/ML IJ SOLN
10.0000 mg | Freq: Once | INTRAMUSCULAR | Status: AC
  Administered 2024-01-05: 10 mg via INTRAVENOUS
  Filled 2024-01-05: qty 2

## 2024-01-05 MED ORDER — ALUM & MAG HYDROXIDE-SIMETH 200-200-20 MG/5ML PO SUSP
30.0000 mL | Freq: Once | ORAL | Status: AC
  Administered 2024-01-06: 30 mL via ORAL
  Filled 2024-01-05: qty 30

## 2024-01-05 NOTE — Progress Notes (Signed)
 Attending Physician's Attestation   I have reviewed the chart.   I agree with the Advanced Practitioner's note, impression, and recommendations with any updates as below.    [name], MD [facility] Advanced Endoscopy Office # [phone]

## 2024-01-05 NOTE — ED Provider Notes (Signed)
 [place] EMERGENCY DEPARTMENT AT [facility] Provider Note   CSN: [id number] Arrival date & time: 01/05/24  1914     History  Chief Complaint  Patient presents with   Abdominal Pain    Tamara Church is a 48 y.o. female past medical history significant for GERD, chronic abdominal pain, hiatal hernia, hypokalemia, and gastritis presents today for epigastric abdominal pain that started at 9 AM today.  Patient also endorses nausea with no relief from Zofran at home.  Of note patient has been seen 4 times since January for similar complaints, most recently yesterday.  Patient reports passing gas and an episode of loose stools this morning.  Patient denies vomiting, fever, chills, urinary symptoms, any other complaints at this time.  Patient states that this feels like her usual hiatal hernia symptoms and does not report any new symptoms.   Abdominal Pain Associated symptoms: nausea        Home Medications Prior to Admission medications   Medication Sig Start Date End Date Taking? Authorizing Provider  amLODipine (NORVASC) 10 MG tablet Take 1 tablet by mouth once daily 11/04/23   Glendale Chard, DO  dexlansoprazole (DEXILANT) 60 MG capsule Take 1 capsule (60 mg total) by mouth daily. 11/19/23   Mansouraty, Netty Starring., MD  famotidine (PEPCID) 20 MG tablet Take 1 tablet by mouth twice daily Patient not taking: Reported on 01/03/2024 12/16/23   Mansouraty, Netty Starring., MD  folic acid (FOLVITE) 1 MG tablet Take 1 tablet (1 mg total) by mouth daily. 10/13/23   Mansouraty, Netty Starring., MD  hydrOXYzine (ATARAX) 10 MG tablet Take 1 tablet (10 mg total) by mouth 2 (two) times daily. Patient not taking: Reported on 01/03/2024 07/21/23   Mansouraty, Netty Starring., MD  loperamide (IMODIUM) 2 MG capsule Take 1 capsule (2 mg total) by mouth 4 (four) times daily as needed for diarrhea or loose stools. Patient not taking: Reported on 01/03/2024 09/14/23   Arthor Captain, PA-C  ondansetron  (ZOFRAN-ODT) 4 MG disintegrating tablet Take 1 tablet (4 mg total) by mouth every 8 (eight) hours as needed. Patient not taking: Reported on 01/03/2024 11/19/23   Mansouraty, Netty Starring., MD  promethazine (PHENERGAN) 25 MG suppository Place 1 suppository (25 mg total) rectally every 6 (six) hours as needed for nausea or vomiting. Patient not taking: Reported on 01/03/2024 07/24/23   Coral Spikes, DO  spironolactone (ALDACTONE) 25 MG tablet Take 2 tablets (50 mg total) by mouth at bedtime. 09/30/23   Glendale Chard, DO      Allergies    Hctz [hydrochlorothiazide], Ace inhibitors, and Angiotensin receptor blockers    Review of Systems   Review of Systems  Gastrointestinal:  Positive for abdominal pain and nausea.    Physical Exam Updated Vital Signs BP (!) 137/108   Pulse 99   Temp 99 F (37.2 C) (Oral)   Resp 18   Ht 5\' 2"  (1.575 m)   Wt 77.1 kg   SpO2 100%   BMI 31.09 kg/m  Physical Exam Vitals and nursing note reviewed.  Constitutional:      General: She is not in acute distress.    Appearance: She is well-developed.     Comments: Uncomfortable appearing  HENT:     Head: Normocephalic and atraumatic.  Eyes:     Conjunctiva/sclera: Conjunctivae normal.  Cardiovascular:     Rate and Rhythm: Normal rate and regular rhythm.     Heart sounds: Normal heart sounds. No murmur heard. Pulmonary:  Effort: Pulmonary effort is normal. No respiratory distress.     Breath sounds: Normal breath sounds.  Abdominal:     General: Bowel sounds are normal. There is no distension.     Palpations: Abdomen is soft.     Tenderness: There is abdominal tenderness in the epigastric area.     Comments: Burping on exam  Musculoskeletal:        General: No swelling.     Cervical back: Neck supple.  Skin:    General: Skin is warm and dry.     Capillary Refill: Capillary refill takes less than 2 seconds.  Neurological:     General: No focal deficit present.     Mental Status: She is alert.   Psychiatric:        Mood and Affect: Mood normal.     ED Results / Procedures / Treatments   Labs (all labs ordered are listed, but only abnormal results are displayed) Labs Reviewed  COMPREHENSIVE METABOLIC PANEL WITH GFR - Abnormal; Notable for the following components:      Result Value   Sodium 134 (*)    Glucose, Bld 105 (*)    Total Protein 8.8 (*)    All other components within normal limits  URINALYSIS, ROUTINE W REFLEX MICROSCOPIC - Abnormal; Notable for the following components:   Ketones, ur 20 (*)    All other components within normal limits  LIPASE, BLOOD  CBC  HCG, SERUM, QUALITATIVE    EKG None  Radiology CT ABDOMEN PELVIS W CONTRAST Result Date: 01/04/2024 CLINICAL DATA:  Abdominal pain, acute, nonlocalized EXAM: CT ABDOMEN AND PELVIS WITH CONTRAST TECHNIQUE: Multidetector CT imaging of the abdomen and pelvis was performed using the standard protocol following bolus administration of intravenous contrast. RADIATION DOSE REDUCTION: This exam was performed according to the departmental dose-optimization program which includes automated exposure control, adjustment of the mA and/or kV according to patient size and/or use of iterative reconstruction technique. CONTRAST:  OMNIPAQUE IOHEXOL 300 MG/ML  SOLN COMPARISON:  December 07, 2023, July 23, 2023 FINDINGS: Lower chest: No focal airspace consolidation or pleural effusion. Hepatobiliary: No mass.Cholecystectomy.No intrahepatic or extrahepatic biliary ductal dilation.The portal veins are patent. Pancreas: No mass or main ductal dilation.No peripancreatic inflammation or fluid collection. Spleen: Normal size. No mass. Adrenals/Urinary Tract: No adrenal masses. Similar appearance of multiple bilateral renal cysts. No nephrolithiasis or hydronephrosis. Circumferential wall thickening of the urinary bladder. Stomach/Bowel: The stomach is decompressed without focal abnormality. No small bowel wall thickening or  inflammation. No small bowel obstruction. Normal appendix. Vascular/Lymphatic: No aortic aneurysm. No intraabdominal or pelvic lymphadenopathy. Reproductive: The uterus and ovaries are within normal limits for patient's age. 2.9 cm dominant follicle in the right ovary. Intrauterine device is well positioned in the upper endometrium.No free pelvic fluid. Other: No pneumoperitoneum, ascites, or mesenteric inflammation. Musculoskeletal: No acute fracture or destructive lesion. IMPRESSION: 1. Circumferential wall thickening of the urinary bladder, which may be due to underdistension. If there is concern for acute cystitis, correlation with urinalysis would be recommended. 2. Dominant follicle in the right ovary measuring 2.9 cm. Electronically Signed   By: Rance Burrows M.D.   On: 01/04/2024 13:51   DG Abdomen Acute W/Chest Result Date: 01/04/2024 CLINICAL DATA:  Chest pain.  Abdominal pain. EXAM: DG ABDOMEN ACUTE WITH 1 VIEW CHEST COMPARISON:  Chest radiographs 12/07/2023, CT abdomen pelvis 12/07/2023 FINDINGS: Cardiac silhouette mediastinal contours are within limits. The small sliding hiatal hernia seen on recent CT is again not well visualized  on radiograph. The lungs are clear. No pleural effusion or thorax. Minimal levocurvature centered at the thoracolumbar junction. No dilated loops of bowel to indicate bowel obstruction. No significant stool burden. No portal venous gas or pneumatosis. Cholecystectomy clips. No subdiaphragmatic free air. Moderate bilateral superolateral acetabular degenerative osteophytes. Minimal dextrocurvature centered at L3. IMPRESSION: 1. No acute cardiopulmonary process. 2. Nonobstructive bowel gas pattern. Electronically Signed   By: Bertina Broccoli M.D.   On: 01/04/2024 09:26    Procedures Procedures    Medications Ordered in ED Medications - No data to display  ED Course/ Medical Decision Making/ A&P                                 Medical Decision Making Amount  and/or Complexity of Data Reviewed Labs: ordered.  Risk Prescription drug management.   This patient presents to the ED for concern of abdominal pain and nausea, this involves an extensive number of treatment options, and is a complaint that carries with it a high risk of complications and morbidity.  The differential diagnosis includes hiatal hernia, pancreatitis, GERD, choledocholithiasis, acute cholecystitis   Co morbidities that complicate the patient evaluation  Hiatal hernia, GERD   Additional history obtained:  External records from outside source obtained and reviewed including gastroenterology notes   Lab Tests:  I Ordered, and personally interpreted labs.  The pertinent results include: No leukocytosis, negative pregnancy, 20 ketones on urine, mild hyponatremia at 134   Problem List / ED Course / Critical interventions / Medication management  I ordered medication including Reglan for nausea Reevaluation of the patient after these medicines showed that the patient improved I have reviewed the patients home medicines and have made adjustments as needed  Patient signed out to [employer], PA-C at shift change pending reassessment and likely discharge.        Final Clinical Impression(s) / ED Diagnoses Final diagnoses:  None    Rx / DC Orders ED Discharge Orders     None         Merryl Abraham 01/05/24 2209    Nicklas Barns, MD 01/06/24 [phone]

## 2024-01-05 NOTE — ED Provider Notes (Signed)
 Handoff from Euell Herrlich PA, pending re-eval for nausea. No acute intraabdominal causes.  Physical Exam  BP (!) 142/86 (BP Location: Right Arm)   Pulse 92   Temp 98.2 F (36.8 C)   Resp 20   Ht 5\' 2"  (1.575 m)   Wt 77.1 kg   SpO2 100%   BMI 31.09 kg/m   Physical Exam Vitals and nursing note reviewed.  Constitutional:      General: She is not in acute distress.    Appearance: She is well-developed.  HENT:     Head: Normocephalic and atraumatic.  Eyes:     Conjunctiva/sclera: Conjunctivae normal.  Cardiovascular:     Rate and Rhythm: Normal rate and regular rhythm.     Heart sounds: No murmur heard. Pulmonary:     Effort: Pulmonary effort is normal. No respiratory distress.     Breath sounds: Normal breath sounds.  Abdominal:     Palpations: Abdomen is soft.     Tenderness: There is generalized abdominal tenderness.  Musculoskeletal:        General: No swelling.     Cervical back: Neck supple.  Skin:    General: Skin is warm and dry.     Capillary Refill: Capillary refill takes less than 2 seconds.  Neurological:     Mental Status: She is alert.  Psychiatric:        Mood and Affect: Mood normal.     Procedures  Procedures  ED Course / MDM    Medical Decision Making Patient is a 48 year old female, here for nausea, vomiting, abdominal pain, she had negative CT scan 2 days prior, and has reassuring labs today.  After the Reglan  she has persistent nausea.  She does have a hiatal hernia, we will try Maalox, lidocaine  as well as pantoprazole  and droperidol  and see if there is any relief of symptoms.  Reevaluated patient, feeling much better, blood work is reassuring.  She is already on a PPI, I will add Maalox, as well as Zofran  for home.  I instructed her to follow-up with her GI doctor, for further management, as I believe that most of her nausea, vomiting, abdominal pain is likely secondary to her chronic GERD.  Amount and/or Complexity of Data Reviewed Labs:  ordered.  Risk OTC drugs. Prescription drug management.         Timmy Forbes, [place] 01/06/24 0113    Lindle Rhea, MD 01/07/24 1820

## 2024-01-05 NOTE — ED Triage Notes (Signed)
 Right sided abdominal pain that started at 0900 today. C/o nausea, no relief with zofran at home. Hx of hernia, no surgeries on hernia since diagnosis.

## 2024-01-06 ENCOUNTER — Telehealth: Payer: Self-pay | Admitting: Gastroenterology

## 2024-01-06 MED ORDER — ONDANSETRON 4 MG PO TBDP: 4.0000 mg | ORAL_TABLET | Freq: Three times a day (TID) | ORAL | 0 refills | Status: DC | PRN

## 2024-01-06 MED ORDER — ALUMINUM & MAGNESIUM HYDROXIDE 200-200 MG/5ML PO SUSP: 15.0000 mL | Freq: Four times a day (QID) | ORAL | 0 refills | Status: DC | PRN

## 2024-01-06 NOTE — Telephone Encounter (Signed)
 Records, demographics and insurance information faxed to [facility] as ordered. Patient notified.

## 2024-01-06 NOTE — Telephone Encounter (Signed)
 Patient called and stated that she was in the ED Tuesday and Wednesday, was released Wednesday night. Patient stated that when she was at the ED they told her that her hiatal hernia was enlarged and she would need to speak to her gastroenterologist about getting it removed due it cause her other health problems. Patient is requesting a call back. Please advise.

## 2024-01-06 NOTE — Discharge Instructions (Signed)
 Please follow-up with your GI doctor, the cause of your nausea/vomiting, likely is secondary to your acid reflux.  Continue your acid reflux medication, and I have also added something to help you with it.  I prescribed you some nausea medicine as well.  If you feel like symptoms are worsening please return to the ER

## 2024-01-06 NOTE — Telephone Encounter (Signed)
 Patient wants referral for surgery of the hiatal hernia. She continues to be symptomatic with GERD, returning to the ER twice since Monday this week. She reports she understood the hernia was larger.

## 2024-01-06 NOTE — Telephone Encounter (Signed)
 I am sorry to hear this. She literally was seen on Monday in clinic and doing well. There is no change in the CT scan results in regards to her hiatal hernia it still remains unchanged in size. Referral to surgery [facility] can be made to consider hiatal hernia repair, but it is not clear that this is the etiology of her symptoms for now. But we can go ahead and get that set up. She can see Dr. Britta Candy or Dr. Elvan Hamel or Dr. Dorrie Gaudier. Thanks. GM

## 2024-01-10 ENCOUNTER — Telehealth: Payer: Self-pay | Admitting: Gastroenterology

## 2024-01-10 NOTE — Telephone Encounter (Signed)
 Spoke with the pt and gave her the recommendations per [facility].  She will await further recommendations from Dr Brice Campi tomorrow.

## 2024-01-10 NOTE — Telephone Encounter (Signed)
 Inbound call from patient, would like to speak with a nurse in regards to feeling "sick", patient states she has lost over 12 pounds in the last week, she states she is nauseas and has not been able to eat anything. She would like to speak with a nurse to further advise on what she can do to improve her symptoms.

## 2024-01-10 NOTE — Telephone Encounter (Signed)
 The pt calls with pain in the chest feels like water or anything by mouth "comes up in the throat"  She has nausea- no vomiting She is unable to eat for the past 3 days. Can only take in water in very small amounts.    She has a history of Hiatal hernia, esophagitis, spasms and gastritis, Chronic Tonna Frederic   She was in ED 3 times last week   Zofran  works for a few hours -[name] works for a few hours    On dexilant , pepcid , [name], zofran  as directed.  She would like to know what else she can do. She is miserable and not sure what else she can do. She has lost 12 pounds over the past week.    Please advise. Dr Brice Campi is off today

## 2024-01-11 ENCOUNTER — Other Ambulatory Visit: Payer: Self-pay

## 2024-01-11 MED ORDER — AMITRIPTYLINE HCL 50 MG PO TABS: 50.0000 mg | ORAL_TABLET | Freq: Every day | ORAL | 6 refills | Status: DC

## 2024-01-11 NOTE — Telephone Encounter (Signed)
 Prescription has been sent to the pharmacy The pt has been advised and will call in 2 weeks with an update

## 2024-01-11 NOTE — Telephone Encounter (Signed)
 I think we need to go ahead and began amitriptyline  for functional nausea/vomiting. Amitriptyline  50 mg nightly (30/6). If she tolerates amitriptyline  can increase to 75 mg nightly after 2 full weeks of 50 mg. Make sure she is aware that she should not be operating or driving at until she knows how the amitriptyline  affects her and again recommend this to be done nightly before bed so that she is heading to bed due to the issues of sleepiness that can occur. Higher doses of TCAs, can also increase thirst and urinary retention so need to be remembered as well. Thanks. GM

## 2024-01-19 ENCOUNTER — Other Ambulatory Visit (HOSPITAL_COMMUNITY): Payer: Self-pay | Admitting: General Surgery

## 2024-01-19 DIAGNOSIS — K449 Diaphragmatic hernia without obstruction or gangrene: Secondary | ICD-10-CM | POA: Diagnosis not present

## 2024-01-19 DIAGNOSIS — K224 Dyskinesia of esophagus: Secondary | ICD-10-CM | POA: Diagnosis not present

## 2024-01-19 DIAGNOSIS — R110 Nausea: Secondary | ICD-10-CM | POA: Diagnosis not present

## 2024-01-19 DIAGNOSIS — K219 Gastro-esophageal reflux disease without esophagitis: Secondary | ICD-10-CM

## 2024-01-21 ENCOUNTER — Ambulatory Visit (HOSPITAL_COMMUNITY)
Admission: RE | Admit: 2024-01-21 | Discharge: 2024-01-21 | Disposition: A | Discharge status: Home / Self Care | Source: Ambulatory Visit | Attending: General Surgery | Admitting: General Surgery

## 2024-01-21 DIAGNOSIS — K449 Diaphragmatic hernia without obstruction or gangrene: Secondary | ICD-10-CM

## 2024-01-21 DIAGNOSIS — K219 Gastro-esophageal reflux disease without esophagitis: Secondary | ICD-10-CM | POA: Diagnosis present

## 2024-01-21 DIAGNOSIS — R1110 Vomiting, unspecified: Secondary | ICD-10-CM | POA: Diagnosis not present

## 2024-01-21 DIAGNOSIS — K2100 Gastro-esophageal reflux disease with esophagitis, without bleeding: Secondary | ICD-10-CM | POA: Diagnosis not present

## 2024-01-21 DIAGNOSIS — K2970 Gastritis, unspecified, without bleeding: Secondary | ICD-10-CM | POA: Diagnosis not present

## 2024-01-25 ENCOUNTER — Telehealth: Payer: Self-pay | Admitting: Gastroenterology

## 2024-01-25 DIAGNOSIS — R7989 Other specified abnormal findings of blood chemistry: Secondary | ICD-10-CM

## 2024-01-25 DIAGNOSIS — R1013 Epigastric pain: Secondary | ICD-10-CM

## 2024-01-25 DIAGNOSIS — R519 Headache, unspecified: Secondary | ICD-10-CM

## 2024-01-25 DIAGNOSIS — R110 Nausea: Secondary | ICD-10-CM

## 2024-01-25 NOTE — Telephone Encounter (Signed)
 Inbound call from patient stating she has been feeling dizzy with headaches. States she would like to have magnesium  levels check due to reading Dexilant  can cause magnesium  level to drop. Please advise, thank you

## 2024-01-25 NOTE — Telephone Encounter (Signed)
 Patient reports headaches and lightheadedness for about a week and a half. She was wondering if a low magnesium  could cause her symptoms. Encouraged to contact the PCP due to the headaches. Do you want to do any labs?

## 2024-01-25 NOTE — Telephone Encounter (Signed)
 Agree with your plan. Also go ahead and do CBC/CMP/Mg/Phos/Amylase/Lipase. Thanks. GM

## 2024-01-26 ENCOUNTER — Other Ambulatory Visit

## 2024-01-26 DIAGNOSIS — R519 Headache, unspecified: Secondary | ICD-10-CM

## 2024-01-26 DIAGNOSIS — R1013 Epigastric pain: Secondary | ICD-10-CM | POA: Diagnosis not present

## 2024-01-26 DIAGNOSIS — K2990 Gastroduodenitis, unspecified, without bleeding: Secondary | ICD-10-CM

## 2024-01-26 DIAGNOSIS — R7989 Other specified abnormal findings of blood chemistry: Secondary | ICD-10-CM

## 2024-01-26 DIAGNOSIS — K2970 Gastritis, unspecified, without bleeding: Secondary | ICD-10-CM

## 2024-01-26 DIAGNOSIS — R110 Nausea: Secondary | ICD-10-CM | POA: Diagnosis not present

## 2024-01-26 DIAGNOSIS — K2100 Gastro-esophageal reflux disease with esophagitis, without bleeding: Secondary | ICD-10-CM | POA: Diagnosis not present

## 2024-01-26 LAB — PHOSPHORUS: Phosphorus: 3.3 mg/dL (ref 2.3–4.6)

## 2024-01-26 LAB — CBC WITH DIFFERENTIAL/PLATELET
Basophils Absolute: 0.1 10*3/uL (ref 0.0–0.1)
Basophils Relative: 0.7 % (ref 0.0–3.0)
Eosinophils Absolute: 0.1 10*3/uL (ref 0.0–0.7)
Eosinophils Relative: 1.6 % (ref 0.0–5.0)
HCT: 35.8 % — ABNORMAL LOW (ref 36.0–46.0)
Hemoglobin: 11.7 g/dL — ABNORMAL LOW (ref 12.0–15.0)
Lymphocytes Relative: 31.4 % (ref 12.0–46.0)
Lymphs Abs: 2.2 10*3/uL (ref 0.7–4.0)
MCHC: 32.7 g/dL (ref 30.0–36.0)
MCV: 91.2 fl (ref 78.0–100.0)
Monocytes Absolute: 0.5 10*3/uL (ref 0.1–1.0)
Monocytes Relative: 7.6 % (ref 3.0–12.0)
Neutro Abs: 4.1 10*3/uL (ref 1.4–7.7)
Neutrophils Relative %: 58.7 % (ref 43.0–77.0)
Platelets: 225 10*3/uL (ref 150.0–400.0)
RBC: 3.93 Mil/uL (ref 3.87–5.11)
RDW: 14 % (ref 11.5–15.5)
WBC: 7.1 10*3/uL (ref 4.0–10.5)

## 2024-01-26 LAB — COMPREHENSIVE METABOLIC PANEL WITH GFR
ALT: 10 U/L (ref 0–35)
AST: 11 U/L (ref 0–37)
Albumin: 4.3 g/dL (ref 3.5–5.2)
Alkaline Phosphatase: 49 U/L (ref 39–117)
BUN: 12 mg/dL (ref 6–23)
CO2: 27 meq/L (ref 19–32)
Calcium: 9.4 mg/dL (ref 8.4–10.5)
Chloride: 100 meq/L (ref 96–112)
Creatinine, Ser: 0.89 mg/dL (ref 0.40–1.20)
GFR: 77.72 mL/min (ref >60.00)
Glucose, Bld: 95 mg/dL (ref 70–99)
Potassium: 4.3 meq/L (ref 3.5–5.1)
Sodium: 135 meq/L (ref 135–145)
Total Bilirubin: 0.3 mg/dL (ref 0.2–1.2)
Total Protein: 7.3 g/dL (ref 6.0–8.3)

## 2024-01-26 LAB — MAGNESIUM: Magnesium: 2.2 mg/dL (ref 1.5–2.5)

## 2024-01-26 LAB — LIPASE: Lipase: 23 U/L (ref 11.0–59.0)

## 2024-01-26 LAB — FOLATE: Folate: >25.2 ng/mL (ref >5.9)

## 2024-01-26 LAB — AMYLASE: Amylase: 64 U/L (ref 27–131)

## 2024-01-26 NOTE — Addendum Note (Signed)
 Addended by: Aneita Keens on: 01/26/2024 11:34 AM   Modules accepted: Orders

## 2024-01-26 NOTE — Telephone Encounter (Signed)
 The patient has been notified of this information and all questions answered.   Lab orders entered

## 2024-01-31 ENCOUNTER — Other Ambulatory Visit: Payer: Self-pay | Admitting: Student

## 2024-01-31 ENCOUNTER — Other Ambulatory Visit: Payer: Self-pay

## 2024-01-31 DIAGNOSIS — D509 Iron deficiency anemia, unspecified: Secondary | ICD-10-CM

## 2024-02-08 ENCOUNTER — Telehealth: Payer: Self-pay

## 2024-02-08 DIAGNOSIS — I10 Essential (primary) hypertension: Secondary | ICD-10-CM

## 2024-02-09 ENCOUNTER — Telehealth: Payer: Self-pay

## 2024-02-09 NOTE — Progress Notes (Signed)
 Complex Care Management Note  Care Guide Note 02/09/2024 Name: SHANTE ARCHAMBEAULT MRN: [id number] DOB: [birth year]  Morey Ar is a 48 y.o. year old female who sees Clem Currier, DO for primary care. I reached out to Morey Ar by phone today to offer complex care management services.  Ms. Coto was given information about Complex Care Management services today including:   The Complex Care Management services include support from the care team which includes your Nurse Care Manager, Clinical Social Worker, or Pharmacist.  The Complex Care Management team is here to help remove barriers to the health concerns and goals most important to you. Complex Care Management services are voluntary, and the patient may decline or stop services at any time by request to their care team member.   Complex Care Management Consent Status: Patient agreed to services and verbal consent obtained.   Follow up plan:  Telephone appointment with complex care management team member scheduled for:  02/17/24 at 2:00 p.m.  Encounter Outcome:  Patient Scheduled  [facility]  [facility], [facility] Care Management Assistant Direct Dial: [phone]  Fax: [phone]

## 2024-02-10 ENCOUNTER — Emergency Department (HOSPITAL_COMMUNITY)

## 2024-02-10 ENCOUNTER — Encounter (HOSPITAL_COMMUNITY): Payer: Self-pay

## 2024-02-10 ENCOUNTER — Other Ambulatory Visit: Payer: Self-pay

## 2024-02-10 ENCOUNTER — Encounter: Payer: Self-pay | Admitting: Gastroenterology

## 2024-02-10 ENCOUNTER — Emergency Department (HOSPITAL_COMMUNITY)
Admission: EM | Admit: 2024-02-10 | Discharge: 2024-02-10 | Disposition: A | Discharge status: Home / Self Care | Attending: Emergency Medicine | Admitting: Emergency Medicine

## 2024-02-10 DIAGNOSIS — N200 Calculus of kidney: Secondary | ICD-10-CM | POA: Diagnosis not present

## 2024-02-10 DIAGNOSIS — K449 Diaphragmatic hernia without obstruction or gangrene: Secondary | ICD-10-CM | POA: Diagnosis not present

## 2024-02-10 DIAGNOSIS — K529 Noninfective gastroenteritis and colitis, unspecified: Secondary | ICD-10-CM | POA: Diagnosis not present

## 2024-02-10 DIAGNOSIS — R109 Unspecified abdominal pain: Secondary | ICD-10-CM | POA: Diagnosis present

## 2024-02-10 DIAGNOSIS — Q2733 Arteriovenous malformation of digestive system vessel: Secondary | ICD-10-CM | POA: Diagnosis not present

## 2024-02-10 LAB — COMPREHENSIVE METABOLIC PANEL WITH GFR
ALT: 17 U/L (ref 0–44)
AST: 20 U/L (ref 15–41)
Albumin: 4.5 g/dL (ref 3.5–5.0)
Alkaline Phosphatase: 58 U/L (ref 38–126)
Anion gap: 11 (ref 5–15)
BUN: 11 mg/dL (ref 6–20)
CO2: 23 mmol/L (ref 22–32)
Calcium: 10.4 mg/dL — ABNORMAL HIGH (ref 8.9–10.3)
Chloride: 101 mmol/L (ref 98–111)
Creatinine, Ser: 0.94 mg/dL (ref 0.44–1.00)
GFR, Estimated: >60 mL/min (ref >60)
Glucose, Bld: 127 mg/dL — ABNORMAL HIGH (ref 70–99)
Potassium: 4 mmol/L (ref 3.5–5.1)
Sodium: 135 mmol/L (ref 135–145)
Total Bilirubin: 0.9 mg/dL (ref 0.0–1.2)
Total Protein: 9 g/dL — ABNORMAL HIGH (ref 6.5–8.1)

## 2024-02-10 LAB — URINALYSIS, ROUTINE W REFLEX MICROSCOPIC
Bacteria, UA: NONE SEEN
Bilirubin Urine: NEGATIVE
Glucose, UA: NEGATIVE mg/dL
Ketones, ur: NEGATIVE mg/dL
Leukocytes,Ua: NEGATIVE
Nitrite: NEGATIVE
Protein, ur: NEGATIVE mg/dL
Specific Gravity, Urine: 1.001 — ABNORMAL LOW (ref 1.005–1.030)
pH: 6 (ref 5.0–8.0)

## 2024-02-10 LAB — CBC
HCT: 40.5 % (ref 36.0–46.0)
Hemoglobin: 12.9 g/dL (ref 12.0–15.0)
MCH: 29.3 pg (ref 26.0–34.0)
MCHC: 31.9 g/dL (ref 30.0–36.0)
MCV: 91.8 fL (ref 80.0–100.0)
Platelets: 295 10*3/uL (ref 150–400)
RBC: 4.41 MIL/uL (ref 3.87–5.11)
RDW: 13.6 % (ref 11.5–15.5)
WBC: 9.3 10*3/uL (ref 4.0–10.5)
nRBC: 0 % (ref 0.0–0.2)

## 2024-02-10 LAB — LIPASE, BLOOD: Lipase: 30 U/L (ref 11–51)

## 2024-02-10 LAB — HCG, SERUM, QUALITATIVE: Preg, Serum: NEGATIVE

## 2024-02-10 MED ORDER — SODIUM CHLORIDE 0.9 % IV BOLUS
500.0000 mL | Freq: Once | INTRAVENOUS | Status: AC
  Administered 2024-02-10: 500 mL via INTRAVENOUS

## 2024-02-10 MED ORDER — HYDROMORPHONE HCL 1 MG/ML IJ SOLN
0.5000 mg | Freq: Once | INTRAMUSCULAR | Status: AC
  Administered 2024-02-10: 0.5 mg via INTRAVENOUS
  Filled 2024-02-10: qty 1

## 2024-02-10 MED ORDER — OXYCODONE-ACETAMINOPHEN 5-325 MG PO TABS: 2.0000 | ORAL_TABLET | ORAL | 0 refills | Status: DC | PRN

## 2024-02-10 MED ORDER — AMOXICILLIN-POT CLAVULANATE 875-125 MG PO TABS: 1.0000 | ORAL_TABLET | Freq: Two times a day (BID) | ORAL | 0 refills | Status: DC

## 2024-02-10 MED ORDER — PANTOPRAZOLE SODIUM 40 MG IV SOLR
40.0000 mg | Freq: Once | INTRAVENOUS | Status: AC
  Administered 2024-02-10: 40 mg via INTRAVENOUS
  Filled 2024-02-10: qty 10

## 2024-02-10 MED ORDER — OXYCODONE-ACETAMINOPHEN 5-325 MG PO TABS: 1.0000 | ORAL_TABLET | Freq: Four times a day (QID) | ORAL | 0 refills | Status: DC | PRN

## 2024-02-10 MED ORDER — ONDANSETRON HCL 4 MG/2ML IJ SOLN
4.0000 mg | Freq: Once | INTRAMUSCULAR | Status: AC
  Administered 2024-02-10: 4 mg via INTRAVENOUS
  Filled 2024-02-10: qty 2

## 2024-02-10 MED ORDER — IOHEXOL 300 MG/ML  SOLN
100.0000 mL | Freq: Once | INTRAMUSCULAR | Status: AC | PRN
  Administered 2024-02-10: 100 mL via INTRAVENOUS

## 2024-02-10 NOTE — ED Triage Notes (Signed)
 Pt states she has been having nausea since yesterday with vomiting starting today. Pt also having a little diarrhea.

## 2024-02-10 NOTE — ED Provider Notes (Signed)
 RN presented into the work room as the patient was being discharged by an earlier provider, who had gone home, but did not receive her prescriptions.  They request that the Augmentin  and the Percocet be sent to her preferred [facility].  I confirmed that the Percocet originally had been a printed prescription, which did not print out, and therefore I have sent e-scripts for both medications to her pharmacy.  Please note that I did not directly care for this patient and I was not involved in her care management in the ED.  I provided this prescription as the original provider was no longer available to resend this medication, and the patient had an acute need for this medication today.   Arvilla Birmingham, MD 02/10/24 [phone]

## 2024-02-10 NOTE — ED Provider Notes (Signed)
 [place] [facility]PARTMENT AT [facility] Provider Note   CSN: [id number] Arrival date & time: 02/10/24  1324     History  Chief Complaint  Patient presents with   Emesis    Tamara Church is a 48 y.o. female.  Patient has a history of gastritis.  She complains of nausea abdominal pain and some diarrhea.  The history is provided by the patient and medical records. No language interpreter was used.  Abdominal Pain Pain location:  Generalized Pain quality: aching   Pain radiates to:  Does not radiate Pain severity:  Mild Onset quality:  Sudden Timing:  Intermittent Progression:  Waxing and waning Chronicity:  New Context: not alcohol use   Relieved by:  Nothing Worsened by:  Nothing Associated symptoms: no chest pain, no cough, no diarrhea, no fatigue and no hematuria        Home Medications Prior to Admission medications   Medication Sig Start Date End Date Taking? Authorizing Provider  amoxicillin -clavulanate (AUGMENTIN ) 875-125 MG tablet Take 1 tablet by mouth every 12 (twelve) hours. 02/10/24  Yes Airanna Partin, MD  oxyCODONE -acetaminophen  (PERCOCET) 5-325 MG tablet Take 2 tablets by mouth every 4 (four) hours as needed. 02/10/24  Yes Jovanie Verge, MD  aluminum -magnesium  hydroxide 200-200 MG/5ML suspension Take 15 mLs by mouth every 6 (six) hours as needed for indigestion. 01/06/24   Small, Brooke L, PA  amitriptyline  (ELAVIL ) 50 MG tablet Take 1 tablet (50 mg total) by mouth at bedtime. 01/11/24   Mansouraty, Albino Alu., MD  amLODipine  (NORVASC ) 10 MG tablet Take 1 tablet by mouth once daily 01/31/24   Clem Currier, DO  dexlansoprazole  (DEXILANT ) 60 MG capsule Take 1 capsule (60 mg total) by mouth daily. 11/19/23   Mansouraty, Albino Alu., MD  famotidine  (PEPCID ) 20 MG tablet Take 1 tablet by mouth twice daily Patient not taking: Reported on 01/03/2024 12/16/23   Mansouraty, Albino Alu., MD  folic acid  (FOLVITE ) 1 MG tablet Take 1 tablet (1 mg total)  by mouth daily. 10/13/23   Mansouraty, Albino Alu., MD  hydrOXYzine  (ATARAX ) 10 MG tablet Take 1 tablet (10 mg total) by mouth 2 (two) times daily. Patient not taking: Reported on 01/03/2024 07/21/23   Mansouraty, Albino Alu., MD  loperamide  (IMODIUM ) 2 MG capsule Take 1 capsule (2 mg total) by mouth 4 (four) times daily as needed for diarrhea or loose stools. Patient not taking: Reported on 01/03/2024 09/14/23   Harris, Abigail, PA-C  ondansetron  (ZOFRAN -ODT) 4 MG disintegrating tablet Take 1 tablet (4 mg total) by mouth every 8 (eight) hours as needed for nausea or vomiting. 01/06/24   Small, Brooke L, PA  promethazine  (PHENERGAN ) 25 MG suppository Place 1 suppository (25 mg total) rectally every 6 (six) hours as needed for nausea or vomiting. Patient not taking: Reported on 01/03/2024 07/24/23   Rolinda Climes, DO  spironolactone  (ALDACTONE ) 25 MG tablet Take 2 tablets (50 mg total) by mouth at bedtime. 09/30/23   Clem Currier, DO      Allergies    Hctz [hydrochlorothiazide ], Ace inhibitors, and Angiotensin receptor blockers    Review of Systems   Review of Systems  Constitutional:  Negative for appetite change and fatigue.  HENT:  Negative for congestion, ear discharge and sinus pressure.   Eyes:  Negative for discharge.  Respiratory:  Negative for cough.   Cardiovascular:  Negative for chest pain.  Gastrointestinal:  Positive for abdominal pain. Negative for diarrhea.  Genitourinary:  Negative for frequency and hematuria.  Musculoskeletal:  Negative for back pain.  Skin:  Negative for rash.  Neurological:  Negative for seizures and headaches.  Psychiatric/Behavioral:  Negative for hallucinations.     Physical Exam Updated Vital Signs BP 135/89   Pulse 85   Temp 97.8 F (36.6 C)   Resp 18   SpO2 97%  Physical Exam Vitals and nursing note reviewed.  Constitutional:      Appearance: She is well-developed.  HENT:     Head: Normocephalic.     Nose: Nose normal.  Eyes:      General: No scleral icterus.    Conjunctiva/sclera: Conjunctivae normal.  Neck:     Thyroid : No thyromegaly.  Cardiovascular:     Rate and Rhythm: Normal rate and regular rhythm.     Heart sounds: No murmur heard.    No friction rub. No gallop.  Pulmonary:     Breath sounds: No stridor. No wheezing or rales.  Chest:     Chest wall: No tenderness.  Abdominal:     General: There is no distension.     Tenderness: There is abdominal tenderness. There is no rebound.  Musculoskeletal:        General: Normal range of motion.     Cervical back: Neck supple.  Lymphadenopathy:     Cervical: No cervical adenopathy.  Skin:    Findings: No erythema or rash.  Neurological:     Mental Status: She is alert and oriented to person, place, and time.     Motor: No abnormal muscle tone.     Coordination: Coordination normal.  Psychiatric:        Behavior: Behavior normal.     ED Results / Procedures / Treatments   Labs (all labs ordered are listed, but only abnormal results are displayed) Labs Reviewed  COMPREHENSIVE METABOLIC PANEL WITH GFR - Abnormal; Notable for the following components:      Result Value   Glucose, Bld 127 (*)    Calcium 10.4 (*)    Total Protein 9.0 (*)    All other components within normal limits  LIPASE, BLOOD  CBC  HCG, SERUM, QUALITATIVE  URINALYSIS, ROUTINE W REFLEX MICROSCOPIC    EKG None  Radiology CT ABDOMEN PELVIS W CONTRAST Result Date: 02/10/2024 CLINICAL DATA:  One day history of nausea associated with diarrhea and vomiting EXAM: CT ABDOMEN AND PELVIS WITH CONTRAST TECHNIQUE: Multidetector CT imaging of the abdomen and pelvis was performed using the standard protocol following bolus administration of intravenous contrast. RADIATION DOSE REDUCTION: This exam was performed according to the departmental dose-optimization program which includes automated exposure control, adjustment of the mA and/or kV according to patient size and/or use of iterative  reconstruction technique. CONTRAST:  OMNIPAQUE  IOHEXOL  300 MG/ML  SOLN COMPARISON:  CT abdomen and pelvis dated 01/04/2024, MRI abdomen dated 07/23/2023 FINDINGS: Lower chest: No focal consolidation or pulmonary nodule in the lung bases. No pleural effusion or pneumothorax demonstrated. Partially imaged heart size is normal. Hepatobiliary: No focal hepatic lesions. Mild intrahepatic bile duct dilation, likely secondary to cholecystectomy Pancreas: No focal lesions or main ductal dilation. Spleen: Normal in size without focal abnormality. Adrenals/Urinary Tract: No adrenal nodules. No hydronephrosis. Bilateral upper pole nonobstructing punctate renal stones. Multifocal bilateral rounded hypodensities, characterized as cysts on prior MRI. Intermediate attenuation right lower pole hypodensity measuring 14 mm (2:41) also likely represents a hemorrhagic/proteinaceous cyst, unchanged dating back to 05/23/2020. No focal bladder wall thickening. Stomach/Bowel: Small hiatal hernia. Normal appearance of the stomach. No abnormal bowel  dilation. Subjective mucosal hyperenhancement of the rectosigmoid colon. Colon is underdistended. Normal appendix. Vascular/Lymphatic: Dilation of the proximal celiac artery measuring up to 14 mm in diameter (8:54) with subtle linear hypoechogenicity at the origin (2:18), which may represent a dissection flap. In retrospect, these findings may have been subtly seen on prior examinations. No enlarged abdominal or pelvic lymph nodes. Reproductive: No adnexal masses.  Intrauterine device in-situ. Other: No free fluid, fluid collection, or free air. Musculoskeletal: No acute or abnormal lytic or blastic osseous lesions. IMPRESSION: 1. Subjective mucosal hyperenhancement of the rectosigmoid colon, which may be related to underdistention or represent colitis. 2. 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 In retrospect, these findings may have been subtly seen on prior examinations. 3. Bilateral nonobstructing punctate renal stones. 4. Small hiatal hernia. Electronically Signed   By: Limin  Xu M.D.   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  Vascular surgery was consulted and no treatment is recommended now but they will follow-up her up in 3 months..  Also CT scan shows some colitis.                               Medical Decision Making Amount and/or Complexity of Data Reviewed Labs: ordered. Radiology: ordered.  Risk Prescription drug management.   Patient with colitis and celiac aneurysm.  Patient will be treated with Augmentin  and pain medicine and will follow-up up with her PCP        Final Clinical Impression(s) / ED Diagnoses Final diagnoses:  Colitis    Rx / DC Orders ED Discharge Orders          Ordered    amoxicillin -clavulanate (AUGMENTIN ) 875-125 MG tablet  Every 12 hours        02/10/24 1502    oxyCODONE -acetaminophen  (PERCOCET) 5-325 MG tablet  Every 4 hours PRN        02/10/24 1502              Enyah Moman, MD 02/11/24 1208

## 2024-02-10 NOTE — Discharge Instructions (Signed)
 Follow-up with your family doctor next week.  The vascular surgeon should contact you for follow-up in the next few months

## 2024-02-11 ENCOUNTER — Telehealth: Payer: Self-pay

## 2024-02-11 ENCOUNTER — Other Ambulatory Visit: Payer: Self-pay

## 2024-02-11 ENCOUNTER — Encounter (HOSPITAL_COMMUNITY): Payer: Self-pay

## 2024-02-11 ENCOUNTER — Other Ambulatory Visit (HOSPITAL_COMMUNITY): Payer: Self-pay

## 2024-02-11 ENCOUNTER — Emergency Department (HOSPITAL_COMMUNITY)
Admission: EM | Admit: 2024-02-11 | Discharge: 2024-02-11 | Disposition: A | Discharge status: Home / Self Care | Attending: Emergency Medicine | Admitting: Emergency Medicine

## 2024-02-11 DIAGNOSIS — I728 Aneurysm of other specified arteries: Secondary | ICD-10-CM | POA: Diagnosis not present

## 2024-02-11 DIAGNOSIS — R1084 Generalized abdominal pain: Secondary | ICD-10-CM | POA: Insufficient documentation

## 2024-02-11 DIAGNOSIS — I10 Essential (primary) hypertension: Secondary | ICD-10-CM | POA: Insufficient documentation

## 2024-02-11 DIAGNOSIS — E860 Dehydration: Secondary | ICD-10-CM | POA: Insufficient documentation

## 2024-02-11 DIAGNOSIS — R109 Unspecified abdominal pain: Secondary | ICD-10-CM | POA: Diagnosis not present

## 2024-02-11 DIAGNOSIS — K449 Diaphragmatic hernia without obstruction or gangrene: Secondary | ICD-10-CM | POA: Diagnosis not present

## 2024-02-11 DIAGNOSIS — R079 Chest pain, unspecified: Secondary | ICD-10-CM | POA: Diagnosis not present

## 2024-02-11 DIAGNOSIS — R112 Nausea with vomiting, unspecified: Secondary | ICD-10-CM | POA: Insufficient documentation

## 2024-02-11 DIAGNOSIS — R0602 Shortness of breath: Secondary | ICD-10-CM | POA: Diagnosis not present

## 2024-02-11 DIAGNOSIS — Z79899 Other long term (current) drug therapy: Secondary | ICD-10-CM | POA: Diagnosis not present

## 2024-02-11 LAB — URINALYSIS, ROUTINE W REFLEX MICROSCOPIC
Bacteria, UA: NONE SEEN
Bilirubin Urine: NEGATIVE
Glucose, UA: NEGATIVE mg/dL
Ketones, ur: 5 mg/dL — AB
Leukocytes,Ua: NEGATIVE
Nitrite: NEGATIVE
Protein, ur: NEGATIVE mg/dL
Specific Gravity, Urine: 1.009 (ref 1.005–1.030)
pH: 5 (ref 5.0–8.0)

## 2024-02-11 LAB — CBC WITH DIFFERENTIAL/PLATELET
Abs Immature Granulocytes: 0.02 10*3/uL (ref 0.00–0.07)
Basophils Absolute: 0 10*3/uL (ref 0.0–0.1)
Basophils Relative: 1 %
Eosinophils Absolute: 0 10*3/uL (ref 0.0–0.5)
Eosinophils Relative: 0 %
HCT: 41.8 % (ref 36.0–46.0)
Hemoglobin: 13.3 g/dL (ref 12.0–15.0)
Immature Granulocytes: 0 %
Lymphocytes Relative: 18 %
Lymphs Abs: 1.1 10*3/uL (ref 0.7–4.0)
MCH: 29.3 pg (ref 26.0–34.0)
MCHC: 31.8 g/dL (ref 30.0–36.0)
MCV: 92.1 fL (ref 80.0–100.0)
Monocytes Absolute: 0.3 10*3/uL (ref 0.1–1.0)
Monocytes Relative: 6 %
Neutro Abs: 4.6 10*3/uL (ref 1.7–7.7)
Neutrophils Relative %: 75 %
Platelets: 282 10*3/uL (ref 150–400)
RBC: 4.54 MIL/uL (ref 3.87–5.11)
RDW: 13.6 % (ref 11.5–15.5)
WBC: 6.2 10*3/uL (ref 4.0–10.5)
nRBC: 0 % (ref 0.0–0.2)

## 2024-02-11 LAB — COMPREHENSIVE METABOLIC PANEL WITH GFR
ALT: 178 U/L — ABNORMAL HIGH (ref 0–44)
AST: 99 U/L — ABNORMAL HIGH (ref 15–41)
Albumin: 4.6 g/dL (ref 3.5–5.0)
Alkaline Phosphatase: 65 U/L (ref 38–126)
Anion gap: 13 (ref 5–15)
BUN: 8 mg/dL (ref 6–20)
CO2: 24 mmol/L (ref 22–32)
Calcium: 10.3 mg/dL (ref 8.9–10.3)
Chloride: 99 mmol/L (ref 98–111)
Creatinine, Ser: 0.93 mg/dL (ref 0.44–1.00)
GFR, Estimated: >60 mL/min (ref >60)
Glucose, Bld: 107 mg/dL — ABNORMAL HIGH (ref 70–99)
Potassium: 3.8 mmol/L (ref 3.5–5.1)
Sodium: 136 mmol/L (ref 135–145)
Total Bilirubin: 1.3 mg/dL — ABNORMAL HIGH (ref 0.0–1.2)
Total Protein: 9.4 g/dL — ABNORMAL HIGH (ref 6.5–8.1)

## 2024-02-11 LAB — LIPASE, BLOOD: Lipase: 30 U/L (ref 11–51)

## 2024-02-11 MED ORDER — SODIUM CHLORIDE 0.9 % IV BOLUS
1000.0000 mL | Freq: Once | INTRAVENOUS | Status: AC
  Administered 2024-02-11: 1000 mL via INTRAVENOUS

## 2024-02-11 MED ORDER — VOQUEZNA 20 MG PO TABS: 20.0000 mg | ORAL_TABLET | Freq: Every day | ORAL | 3 refills | Status: DC

## 2024-02-11 MED ORDER — PROMETHAZINE HCL 25 MG RE SUPP: 25.0000 mg | Freq: Four times a day (QID) | RECTAL | 0 refills | Status: DC | PRN

## 2024-02-11 MED ORDER — ONDANSETRON HCL 4 MG/2ML IJ SOLN
4.0000 mg | Freq: Once | INTRAMUSCULAR | Status: AC
  Administered 2024-02-11: 4 mg via INTRAVENOUS
  Filled 2024-02-11: qty 2

## 2024-02-11 MED ORDER — FAMOTIDINE IN NACL 20-0.9 MG/50ML-% IV SOLN
20.0000 mg | Freq: Once | INTRAVENOUS | Status: AC
  Administered 2024-02-11: 20 mg via INTRAVENOUS
  Filled 2024-02-11: qty 50

## 2024-02-11 MED ORDER — HYDROMORPHONE HCL 1 MG/ML IJ SOLN
1.0000 mg | Freq: Once | INTRAMUSCULAR | Status: AC
  Administered 2024-02-11: 1 mg via INTRAVENOUS
  Filled 2024-02-11: qty 1

## 2024-02-11 MED ORDER — DROPERIDOL 2.5 MG/ML IJ SOLN
1.2500 mg | Freq: Once | INTRAMUSCULAR | Status: AC
  Administered 2024-02-11: 1.25 mg via INTRAVENOUS
  Filled 2024-02-11: qty 2

## 2024-02-11 MED ORDER — MORPHINE SULFATE (PF) 4 MG/ML IV SOLN
4.0000 mg | Freq: Once | INTRAVENOUS | Status: DC
  Filled 2024-02-11: qty 1

## 2024-02-11 MED ORDER — PROMETHAZINE HCL 25 MG PO TABS: 25.0000 mg | ORAL_TABLET | Freq: Four times a day (QID) | ORAL | 0 refills | Status: DC | PRN

## 2024-02-11 NOTE — Telephone Encounter (Signed)
 Pharmacy Patient Advocate Encounter   Received notification from CoverMyMeds that prior authorization for Voquezna 20MG  tablets is required/requested.   Insurance verification completed.   The patient is insured through [facility] [employer] [facility] [employer] .   Per test claim: PA required; PA submitted to above mentioned insurance via CoverMyMeds Key/confirmation #/EOC [id number] Status is pending

## 2024-02-11 NOTE — ED Provider Notes (Signed)
 [place] EMERGENCY DEPARTMENT AT [facility] Provider Note   CSN: [id number] Arrival date & time: 02/11/24  4098     History  Chief Complaint  Patient presents with   Emesis    Tamara Church is a 48 y.o. female.  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  Pt was here yesterday and was diagnosed with possible colitis by CT scan.  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  She felt well when d/c and went home and started vomiting again this am.  She was unable to keep down her meds.  She feels like her esophagus is closing.  No fevers. Dr. Elvan Hamel (surgery) and Dr. Brice Campi (GI) have been trying to figure out how to help pt.  At this point, it looks like they don't think surgery is the answer and they may start Voquenza to see if that helps better.  However, pt has not heard about that new rx. 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                Home Medications Prior to Admission medications   Medication Sig Start Date End Date Taking? Authorizing Provider  promethazine  (PHENERGAN ) 25 MG suppository Place 1 suppository (25 mg total) rectally every 6 (six) hours as needed for nausea or vomiting. 02/11/24  Yes Sueellen Emery, MD  promethazine  (PHENERGAN ) 25 MG tablet Take 1 tablet (25 mg total) by mouth every 6 (six) hours as needed for nausea or vomiting. 02/11/24  Yes Sueellen Emery, MD  aluminum -magnesium  hydroxide 200-200 MG/5ML suspension Take 15 mLs by mouth every 6 (six) hours as needed for indigestion. 01/06/24   Small, Brooke L, PA  amitriptyline  (ELAVIL ) 50 MG tablet Take 1 tablet (50 mg total) by mouth at bedtime. 01/11/24   Mansouraty, Albino Alu., MD  amLODipine  (NORVASC ) 10 MG tablet Take 1  tablet by mouth once daily 01/31/24   Clem Currier, DO  amoxicillin -clavulanate (AUGMENTIN ) 875-125 MG tablet Take 1 tablet by mouth every 12 (twelve) hours. 02/10/24   Arvilla Birmingham, MD  dexlansoprazole  (DEXILANT ) 60 MG capsule Take 1 capsule (60 mg total) by mouth daily. 11/19/23   Mansouraty, Albino Alu., MD  famotidine  (PEPCID ) 20 MG tablet Take 1 tablet by mouth twice daily Patient not taking: Reported on 01/03/2024 12/16/23   Mansouraty, Albino Alu., MD  folic acid  (FOLVITE ) 1 MG tablet Take 1 tablet (1 mg total) by mouth daily. 10/13/23   Mansouraty, Albino Alu., MD  hydrOXYzine  (ATARAX ) 10 MG tablet Take 1 tablet (10 mg total) by mouth 2 (two) times daily. Patient not taking: Reported on 01/03/2024 07/21/23   Mansouraty, Albino Alu., MD  loperamide  (IMODIUM ) 2 MG capsule Take 1 capsule (2 mg total) by mouth 4 (four) times daily as needed for diarrhea or loose stools. Patient not taking: Reported on 01/03/2024 09/14/23   Harris, Abigail, PA-C  ondansetron  (ZOFRAN -ODT) 4 MG disintegrating tablet Take 1 tablet (4 mg total) by mouth every 8 (eight) hours as needed for nausea or vomiting. 01/06/24   Small, Brooke L, PA  oxyCODONE -acetaminophen  (PERCOCET) 5-325 MG tablet Take 1 tablet by mouth every 6 (six) hours as needed for up to 15 doses. 02/10/24   Arvilla Birmingham, MD  promethazine  (PHENERGAN ) 25 MG  suppository Place 1 suppository (25 mg total) rectally every 6 (six) hours as needed for nausea or vomiting. Patient not taking: Reported on 01/03/2024 07/24/23   Rolinda Climes, DO  spironolactone  (ALDACTONE ) 25 MG tablet Take 2 tablets (50 mg total) by mouth at bedtime. 09/30/23   Clem Currier, DO  Vonoprazan Fumarate (VOQUEZNA) 20 MG TABS Take 20 mg by mouth daily. 02/11/24   Relena Ivancic, MD      Allergies    Hctz [hydrochlorothiazide ], Ace inhibitors, and Angiotensin receptor blockers    Review of Systems   Review of Systems  Gastrointestinal:  Positive for abdominal pain, nausea and  vomiting.  All other systems reviewed and are negative.   Physical Exam Updated Vital Signs BP 120/75   Pulse 80   Temp 98 F (36.7 C) (Oral)   Resp 18   Ht 5\' 2"  (1.575 m)   Wt 72.6 kg   SpO2 99%   BMI 29.26 kg/m  Physical Exam Vitals and nursing note reviewed.  Constitutional:      Appearance: Normal appearance. She is ill-appearing.  HENT:     Head: Normocephalic and atraumatic.     Right Ear: External ear normal.     Left Ear: External ear normal.     Nose: Nose normal.     Mouth/Throat:     Mouth: Mucous membranes are dry.  Eyes:     Extraocular Movements: Extraocular movements intact.     Conjunctiva/sclera: Conjunctivae normal.     Pupils: Pupils are equal, round, and reactive to light.  Cardiovascular:     Rate and Rhythm: Normal rate and regular rhythm.     Pulses: Normal pulses.     Heart sounds: Normal heart sounds.  Pulmonary:     Effort: Pulmonary effort is normal.     Breath sounds: Normal breath sounds.  Abdominal:     General: Abdomen is flat. Bowel sounds are normal.     Palpations: Abdomen is soft.     Tenderness: There is generalized abdominal tenderness.  Musculoskeletal:        General: Normal range of motion.     Cervical back: Normal range of motion and neck supple.  Skin:    General: Skin is warm.     Capillary Refill: Capillary refill takes less than 2 seconds.  Neurological:     General: No focal deficit present.     Mental Status: She is alert and oriented to person, place, and time.  Psychiatric:        Mood and Affect: Mood normal.        Behavior: Behavior normal.     ED Results / Procedures / Treatments   Labs (all labs ordered are listed, but only abnormal results are displayed) Labs Reviewed  COMPREHENSIVE METABOLIC PANEL WITH GFR - Abnormal; Notable for the following components:      Result Value   Glucose, Bld 107 (*)    Total Protein 9.4 (*)    AST 99 (*)    ALT 178 (*)    Total Bilirubin 1.3 (*)    All other  components within normal limits  URINALYSIS, ROUTINE W REFLEX MICROSCOPIC - Abnormal; Notable for the following components:   APPearance HAZY (*)    Hgb urine dipstick MODERATE (*)    Ketones, ur 5 (*)    All other components within normal limits  CBC WITH DIFFERENTIAL/PLATELET  LIPASE, BLOOD    EKG None  Radiology CT ABDOMEN PELVIS W CONTRAST Result Date: 02/10/2024  CLINICAL DATA:  One day history of nausea associated with diarrhea and vomiting EXAM: CT ABDOMEN AND PELVIS WITH CONTRAST TECHNIQUE: Multidetector CT imaging of the abdomen and pelvis was performed using the standard protocol following bolus administration of intravenous contrast. RADIATION DOSE REDUCTION: This exam was performed according to the departmental dose-optimization program which includes automated exposure control, adjustment of the mA and/or kV according to patient size and/or use of iterative reconstruction technique. CONTRAST:  OMNIPAQUE  IOHEXOL  300 MG/ML  SOLN COMPARISON:  CT abdomen and pelvis dated 01/04/2024, MRI abdomen dated 07/23/2023 FINDINGS: Lower chest: No focal consolidation or pulmonary nodule in the lung bases. No pleural effusion or pneumothorax demonstrated. Partially imaged heart size is normal. Hepatobiliary: No focal hepatic lesions. Mild intrahepatic bile duct dilation, likely secondary to cholecystectomy Pancreas: No focal lesions or main ductal dilation. Spleen: Normal in size without focal abnormality. Adrenals/Urinary Tract: No adrenal nodules. No hydronephrosis. Bilateral upper pole nonobstructing punctate renal stones. Multifocal bilateral rounded hypodensities, characterized as cysts on prior MRI. Intermediate attenuation right lower pole hypodensity measuring 14 mm (2:41) also likely represents a hemorrhagic/proteinaceous cyst, unchanged dating back to 05/23/2020. No focal bladder wall thickening. Stomach/Bowel: Small hiatal hernia. Normal appearance of the stomach. No abnormal bowel  dilation. Subjective mucosal hyperenhancement of the rectosigmoid colon. Colon is underdistended. Normal appendix. Vascular/Lymphatic: Dilation of the proximal celiac artery measuring up to 14 mm in diameter (8:54) with subtle linear hypoechogenicity at the origin (2:18), which may represent a dissection flap. In retrospect, these findings may have been subtly seen on prior examinations. No enlarged abdominal or pelvic lymph nodes. Reproductive: No adnexal masses.  Intrauterine device in-situ. Other: No free fluid, fluid collection, or free air. Musculoskeletal: No acute or abnormal lytic or blastic osseous lesions. IMPRESSION: 1. Subjective mucosal hyperenhancement of the rectosigmoid colon, which may be related to underdistention or represent colitis. 2. 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 In retrospect, these findings may have been subtly seen on prior examinations. 3. Bilateral nonobstructing punctate renal stones. 4. Small hiatal hernia. Electronically Signed   By: Limin  Xu M.D.   On: 02/10/2024 14:26    Procedures Procedures    Medications Ordered in ED Medications  sodium chloride  0.9 % bolus 1,000 mL (1,000 mLs Intravenous New Bag/Given 02/11/24 0912)  ondansetron  (ZOFRAN ) injection 4 mg (4 mg Intravenous Given 02/11/24 0912)  HYDROmorphone  (DILAUDID ) injection 1 mg (1 mg Intravenous Given 02/11/24 0913)  famotidine  (PEPCID ) IVPB 20 mg premix (0 mg Intravenous Stopped 02/11/24 1104)  droperidol  (INAPSINE ) 2.5 MG/ML injection 1.25 mg (1.25 mg Intravenous Given 02/11/24 3086)    ED Course/ Medical Decision Making/ A&P                                 Medical Decision Making Amount and/or Complexity of Data Reviewed Labs: ordered.  Risk Prescription drug management.   This patient presents to the ED for concern of pain and n/v, this involves an extensive number of  treatment options, and is a complaint that carries with it a high risk of complications and morbidity.  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  The pertinent results include:  cbc nl, cmp nl other than mild lft elevations, ua + ketones, lip nl   Imaging Studies ordered:  I reviewed CT scan from yesterday I agree with the radiologist interpretation   Cardiac Monitoring:  The patient was maintained on a cardiac monitor.  I personally viewed and interpreted the cardiac monitored which showed an underlying rhythm of: nsr   Medicines ordered and prescription drug management:  I ordered medication including ivfs/zofran /dilaudid /inapsine   for sx  Reevaluation of the patient after these medicines showed that the patient improved I have reviewed the patients home medicines and have made adjustments as needed   Test Considered:  ct   Critical Interventions:  ivfs   Consultations Obtained:  I requested consultation with vascular (Dr. Rosalva Comber),  and discussed lab and imaging findings as well as pertinent plan -he recommends outpatient f/u   Problem List / ED Course:  Celiac artery aneurysm and dissection:  I discussed this with Dr. Rosalva Comber (vascular).  He could see it back to 2023, so nothing acute to do now.  He will follow up with her as an outpatient. N/v:  improved after treatment.  Pt is able to tolerate po fluids.  Pt only has zofran  at home, so I wrote a rx for phenergan  supp and oral. GERD:  Rx for Voquenza went to [facility], so I  have sent it to her usual pharmacy.     Reevaluation:  After the interventions noted above, I reevaluated the patient and found that they have :improved   Social Determinants of Health:  Lives at home   Dispostion:  After consideration of the diagnostic results and the patients response to treatment, I feel that the patent would benefit from discharge with outpatient f/u.          Final Clinical Impression(s) / ED Diagnoses Final diagnoses:  Dehydration  Nausea and vomiting, unspecified vomiting type  Celiac artery aneurysm (HCC)    Rx / DC Orders ED Discharge Orders          Ordered    Vonoprazan Fumarate (VOQUEZNA) 20 MG TABS  Daily        02/11/24 1326    promethazine  (PHENERGAN ) 25 MG tablet  Every 6 hours PRN        02/11/24 1326    promethazine  (PHENERGAN ) 25 MG suppository  Every 6 hours PRN        02/11/24 1326              Sueellen Emery, MD 02/11/24 1327

## 2024-02-11 NOTE — ED Triage Notes (Signed)
 Patient has been vomiting for 4 days. Generalized abdominal pain. Was seen yesterday and given medication along with antibiotics. Said she was told she has an infection in her stomach.

## 2024-02-11 NOTE — Progress Notes (Signed)
 Medication has been sent to the pharmacy The pt is currently in the ED- I will call her next week to update.

## 2024-02-11 NOTE — Discharge Instructions (Addendum)
 Stop Dexilant  if taking the Voquenza.

## 2024-02-11 NOTE — Progress Notes (Signed)
 Lengthy discussion with Dr. Elvan Hamel and some of his surgical colleagues and myself as such that it is not completely clear that hiatal hernia repair and fundoplication will help with all of her symptoms, but there is a chance that it could. There are also some findings recent CT scan that she was in the emergency department for that we have asked vascular surgery for curbside recommendations and further workup of that. 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  Will have my team reach out to the patient and let them know that all of her specialists are in discussion. We do recommend trying to get approval for frequent for nonerosive reflux disease for a period of time to see if that helps with her symptoms. This may be difficult with her Medicaid, but we can see what may be possible.   Voquenza 20 mg once daily.   Yong Henle, MD [facility] Office # [phone]

## 2024-02-12 DIAGNOSIS — R1084 Generalized abdominal pain: Secondary | ICD-10-CM | POA: Diagnosis not present

## 2024-02-12 DIAGNOSIS — R109 Unspecified abdominal pain: Secondary | ICD-10-CM | POA: Diagnosis not present

## 2024-02-12 DIAGNOSIS — R079 Chest pain, unspecified: Secondary | ICD-10-CM | POA: Diagnosis not present

## 2024-02-12 DIAGNOSIS — I10 Essential (primary) hypertension: Secondary | ICD-10-CM | POA: Diagnosis not present

## 2024-02-12 DIAGNOSIS — R0602 Shortness of breath: Secondary | ICD-10-CM | POA: Diagnosis not present

## 2024-02-12 DIAGNOSIS — K449 Diaphragmatic hernia without obstruction or gangrene: Secondary | ICD-10-CM | POA: Diagnosis not present

## 2024-02-16 ENCOUNTER — Emergency Department (HOSPITAL_COMMUNITY)
Admission: EM | Admit: 2024-02-16 | Discharge: 2024-02-17 | Disposition: A | Discharge status: Home / Self Care | Attending: Emergency Medicine | Admitting: Emergency Medicine

## 2024-02-16 ENCOUNTER — Other Ambulatory Visit: Payer: Self-pay

## 2024-02-16 ENCOUNTER — Encounter (HOSPITAL_COMMUNITY): Payer: Self-pay

## 2024-02-16 DIAGNOSIS — I728 Aneurysm of other specified arteries: Secondary | ICD-10-CM | POA: Diagnosis not present

## 2024-02-16 DIAGNOSIS — R110 Nausea: Secondary | ICD-10-CM | POA: Diagnosis not present

## 2024-02-16 DIAGNOSIS — I10 Essential (primary) hypertension: Secondary | ICD-10-CM | POA: Insufficient documentation

## 2024-02-16 DIAGNOSIS — R112 Nausea with vomiting, unspecified: Secondary | ICD-10-CM | POA: Diagnosis not present

## 2024-02-16 DIAGNOSIS — Z79899 Other long term (current) drug therapy: Secondary | ICD-10-CM | POA: Diagnosis not present

## 2024-02-16 DIAGNOSIS — R1084 Generalized abdominal pain: Secondary | ICD-10-CM | POA: Insufficient documentation

## 2024-02-16 DIAGNOSIS — R109 Unspecified abdominal pain: Secondary | ICD-10-CM | POA: Diagnosis present

## 2024-02-16 LAB — COMPREHENSIVE METABOLIC PANEL WITH GFR
ALT: 31 U/L (ref 0–44)
AST: 17 U/L (ref 15–41)
Albumin: 4.3 g/dL (ref 3.5–5.0)
Alkaline Phosphatase: 54 U/L (ref 38–126)
Anion gap: 12 (ref 5–15)
BUN: 12 mg/dL (ref 6–20)
CO2: 23 mmol/L (ref 22–32)
Calcium: 9.8 mg/dL (ref 8.9–10.3)
Chloride: 101 mmol/L (ref 98–111)
Creatinine, Ser: 0.85 mg/dL (ref 0.44–1.00)
GFR, Estimated: >60 mL/min (ref >60)
Glucose, Bld: 105 mg/dL — ABNORMAL HIGH (ref 70–99)
Potassium: 3.9 mmol/L (ref 3.5–5.1)
Sodium: 136 mmol/L (ref 135–145)
Total Bilirubin: 0.8 mg/dL (ref 0.0–1.2)
Total Protein: 8.5 g/dL — ABNORMAL HIGH (ref 6.5–8.1)

## 2024-02-16 LAB — CBC
HCT: 36.7 % (ref 36.0–46.0)
Hemoglobin: 11.8 g/dL — ABNORMAL LOW (ref 12.0–15.0)
MCH: 28.8 pg (ref 26.0–34.0)
MCHC: 32.2 g/dL (ref 30.0–36.0)
MCV: 89.5 fL (ref 80.0–100.0)
Platelets: 279 10*3/uL (ref 150–400)
RBC: 4.1 MIL/uL (ref 3.87–5.11)
RDW: 13.3 % (ref 11.5–15.5)
WBC: 7.3 10*3/uL (ref 4.0–10.5)
nRBC: 0 % (ref 0.0–0.2)

## 2024-02-16 LAB — HCG, SERUM, QUALITATIVE: Preg, Serum: NEGATIVE

## 2024-02-16 LAB — LIPASE, BLOOD: Lipase: 32 U/L (ref 11–51)

## 2024-02-16 MED ORDER — HYDROMORPHONE HCL 1 MG/ML IJ SOLN
1.0000 mg | Freq: Once | INTRAMUSCULAR | Status: AC
  Administered 2024-02-16: 1 mg via INTRAVENOUS
  Filled 2024-02-16: qty 1

## 2024-02-16 MED ORDER — SODIUM CHLORIDE 0.9 % IV SOLN
12.5000 mg | Freq: Four times a day (QID) | INTRAVENOUS | Status: DC | PRN
  Administered 2024-02-17: 12.5 mg via INTRAVENOUS
  Filled 2024-02-16: qty 12.5

## 2024-02-16 MED ORDER — ONDANSETRON HCL 4 MG/2ML IJ SOLN
4.0000 mg | Freq: Once | INTRAMUSCULAR | Status: AC | PRN
  Administered 2024-02-16: 4 mg via INTRAVENOUS
  Filled 2024-02-16: qty 2

## 2024-02-16 MED ORDER — FAMOTIDINE IN NACL 20-0.9 MG/50ML-% IV SOLN
20.0000 mg | Freq: Once | INTRAVENOUS | Status: AC
  Administered 2024-02-16: 20 mg via INTRAVENOUS
  Filled 2024-02-16: qty 50

## 2024-02-16 NOTE — ED Triage Notes (Signed)
 Pt reports RLQ abdominal pain and pain into right flank that started approx one hour ago. Pt reports nausea and denies urinary sx.

## 2024-02-16 NOTE — ED Provider Notes (Signed)
 [place] EMERGENCY DEPARTMENT AT [facility] Provider Note   CSN: [id number] Arrival date & time: 02/16/24  2006     History {Add pertinent medical, surgical, social history, OB history to HPI:1} Chief Complaint  Patient presents with   Abdominal Pain   Flank Pain    STEELE LEDONNE is a 48 y.o. female. Started last Thursday. Unable to keep food fown [place] to beach and went to ER at [facility]. Anytime each makes her nauseous, abd pain. Diarrhea twice today after eating. Has tried phenergan  and zofran  wo relief  Voquenza not covered by medicaid. Finished augmentin . Percoct helps with pain  Gastro tomorrow for esogus 6/19 for vein specialist   Abdominal Pain Flank Pain Associated symptoms include abdominal pain.       Home Medications Prior to Admission medications   Medication Sig Start Date End Date Taking? Authorizing Provider  aluminum -magnesium  hydroxide 200-200 MG/5ML suspension Take 15 mLs by mouth every 6 (six) hours as needed for indigestion. 01/06/24   Small, Brooke L, PA  amitriptyline  (ELAVIL ) 50 MG tablet Take 1 tablet (50 mg total) by mouth at bedtime. 01/11/24   Mansouraty, Albino Alu., MD  amLODipine  (NORVASC ) 10 MG tablet Take 1 tablet by mouth once daily 01/31/24   Clem Currier, DO  amoxicillin -clavulanate (AUGMENTIN ) 875-125 MG tablet Take 1 tablet by mouth every 12 (twelve) hours. 02/10/24   Arvilla Birmingham, MD  dexlansoprazole  (DEXILANT ) 60 MG capsule Take 1 capsule (60 mg total) by mouth daily. 11/19/23   Mansouraty, Albino Alu., MD  famotidine  (PEPCID ) 20 MG tablet Take 1 tablet by mouth twice daily Patient not taking: Reported on 01/03/2024 12/16/23   Mansouraty, Albino Alu., MD  folic acid  (FOLVITE ) 1 MG tablet Take 1 tablet (1 mg total) by mouth daily. 10/13/23   Mansouraty, Albino Alu., MD  hydrOXYzine  (ATARAX ) 10 MG tablet Take 1 tablet (10 mg total) by mouth 2 (two) times daily. Patient not taking: Reported on 01/03/2024 07/21/23    Mansouraty, Albino Alu., MD  loperamide  (IMODIUM ) 2 MG capsule Take 1 capsule (2 mg total) by mouth 4 (four) times daily as needed for diarrhea or loose stools. Patient not taking: Reported on 01/03/2024 09/14/23   Harris, Abigail, PA-C  ondansetron  (ZOFRAN -ODT) 4 MG disintegrating tablet Take 1 tablet (4 mg total) by mouth every 8 (eight) hours as needed for nausea or vomiting. 01/06/24   Small, Brooke L, PA  oxyCODONE -acetaminophen  (PERCOCET) 5-325 MG tablet Take 1 tablet by mouth every 6 (six) hours as needed for up to 15 doses. 02/10/24   Arvilla Birmingham, MD  promethazine  (PHENERGAN ) 25 MG suppository Place 1 suppository (25 mg total) rectally every 6 (six) hours as needed for nausea or vomiting. Patient not taking: Reported on 01/03/2024 07/24/23   Rolinda Climes, DO  promethazine  (PHENERGAN ) 25 MG suppository Place 1 suppository (25 mg total) rectally every 6 (six) hours as needed for nausea or vomiting. 02/11/24   Sueellen Emery, MD  promethazine  (PHENERGAN ) 25 MG tablet Take 1 tablet (25 mg total) by mouth every 6 (six) hours as needed for nausea or vomiting. 02/11/24   Sueellen Emery, MD  spironolactone  (ALDACTONE ) 25 MG tablet Take 2 tablets (50 mg total) by mouth at bedtime. 09/30/23   Clem Currier, DO  Vonoprazan Fumarate (VOQUEZNA) 20 MG TABS Take 20 mg by mouth daily. 02/11/24   Haviland, Julie, MD      Allergies    Hctz [hydrochlorothiazide ], Ace inhibitors, and Angiotensin receptor blockers  Review of Systems   Review of Systems  Gastrointestinal:  Positive for abdominal pain.  Genitourinary:  Positive for flank pain.    Physical Exam Updated Vital Signs BP 122/88   Pulse 84   Temp 98 F (36.7 C) (Oral)   Resp 18   Wt 77.1 kg   LMP 02/10/2024 (Approximate)   SpO2 100%   BMI 31.09 kg/m  Physical Exam  ED Results / Procedures / Treatments   Labs (all labs ordered are listed, but only abnormal results are displayed) Labs Reviewed  COMPREHENSIVE METABOLIC PANEL WITH  GFR - Abnormal; Notable for the following components:      Result Value   Glucose, Bld 105 (*)    Total Protein 8.5 (*)    All other components within normal limits  CBC - Abnormal; Notable for the following components:   Hemoglobin 11.8 (*)    All other components within normal limits  LIPASE, BLOOD  HCG, SERUM, QUALITATIVE  URINALYSIS, ROUTINE W REFLEX MICROSCOPIC    EKG None  Radiology No results found.  Procedures Procedures  {Document cardiac monitor, telemetry assessment procedure when appropriate:1}  Medications Ordered in ED Medications  ondansetron  (ZOFRAN ) injection 4 mg (4 mg Intravenous Given 02/16/24 2026)    ED Course/ Medical Decision Making/ A&P   {   Click here for ABCD2, HEART and other calculatorsREFRESH Note before signing :1}                              Medical Decision Making Amount and/or Complexity of Data Reviewed Labs: ordered.  Risk Prescription drug management.   ***  {Document critical care time when appropriate:1} {Document review of labs and clinical decision tools ie heart score, Chads2Vasc2 etc:1}  {Document your independent review of radiology images, and any outside records:1} {Document your discussion with family members, caretakers, and with consultants:1} {Document social determinants of health affecting pt's care:1} {Document your decision making why or why not admission, treatments were needed:1} Final Clinical Impression(s) / ED Diagnoses Final diagnoses:  None    Rx / DC Orders ED Discharge Orders     None

## 2024-02-17 ENCOUNTER — Other Ambulatory Visit: Payer: Self-pay

## 2024-02-17 DIAGNOSIS — Z139 Encounter for screening, unspecified: Secondary | ICD-10-CM

## 2024-02-17 DIAGNOSIS — R110 Nausea: Secondary | ICD-10-CM | POA: Diagnosis not present

## 2024-02-17 DIAGNOSIS — I728 Aneurysm of other specified arteries: Secondary | ICD-10-CM | POA: Diagnosis not present

## 2024-02-17 DIAGNOSIS — K449 Diaphragmatic hernia without obstruction or gangrene: Secondary | ICD-10-CM | POA: Diagnosis not present

## 2024-02-17 DIAGNOSIS — K224 Dyskinesia of esophagus: Secondary | ICD-10-CM | POA: Diagnosis not present

## 2024-02-17 DIAGNOSIS — K219 Gastro-esophageal reflux disease without esophagitis: Secondary | ICD-10-CM | POA: Diagnosis not present

## 2024-02-17 LAB — I-STAT CG4 LACTIC ACID, ED: Lactic Acid, Venous: 0.6 mmol/L (ref 0.5–1.9)

## 2024-02-17 NOTE — Telephone Encounter (Signed)
 The pt has been advised and aware she can pick up as she is able.

## 2024-02-17 NOTE — Patient Instructions (Signed)
 Visit Information  Tamara Church was given information about Medicaid Managed Care team care coordination services as a part of their [facility] Community Plan Medicaid benefit. Tamara Church verbally consented to engagement with the [facility] Managed Care team.   If you are experiencing a medical emergency, please call 911 or report to your local emergency department or urgent care.   If you have a non-emergency medical problem during routine business hours, please contact your provider's office and ask to speak with a nurse.   For questions related to your [facility], please call: [phone] or visit the homepage here: [email or web address]  If you would like to schedule transportation through your [facility], please call the following number at least 2 days in advance of your appointment: [phone]   Rides for urgent appointments can also be made after hours by calling Member Services.  Call the Behavioral Health Crisis Line at [phone], at any time, 24 hours a day, 7 days a week. If you are in danger or need immediate medical attention call 911.  If you would like help to quit smoking, call 1-800-QUIT-NOW (810-125-5383) OR Espaol: 1-855-Djelo-Ya (6-644-034-7425) o para ms informacin haga clic aqu or Text READY to 956-387 to register via text  Tamara Church - following are the goals we discussed in your visit today:   Goals Addressed             This Visit's Progress    VBCI RN Care Plan related to GERD/dysphagia   On track    Problems:  Care Coordination needs related to Housing  and utility insecurity due to decreased hours at work due to illness Chronic Disease Management support and education needs related to GERD and dysphagia  Goal: Over the next 30 days the Patient will attend all scheduled medical appointments: [facility] Surgery, GI, vascular  surgery, and cardiology as evidenced by completed visit notes uploaded to EMR        demonstrate a decrease GERD in exacerbations as evidenced by decreased visits to the emergency room (9 in the past 6 months) take all medications exactly as prescribed and will call provider for medication related questions as evidenced by patient report of medication compliance    work with community resource care guide to address needs related to Housing barriers and utility barriers as evidenced by patient and/or community resource care guide support     Interventions:   Evaluation of current treatment plan related to GERD and dysphagia, Housing barriers and utility barriers self-management and patient's adherence to plan as established by provider. Discussed plans with patient for ongoing care management follow up and provided patient with direct contact information for care management team Evaluation of current treatment plan related to GERD/dysphagia and patient's adherence to plan as established by provider Reviewed medications with patient and discussed medication compliance as advised by GI Care Guide referral for SDOH needs Discussed plans with patient for ongoing care management follow up and provided patient with direct contact information for care management team Screening for signs and symptoms of depression related to chronic disease state  Assessed social determinant of health barriers  Patient Self-Care Activities:  Attend all scheduled provider appointments Call provider office for new concerns or questions  Take medications as prescribed    Plan:  Telephone follow up appointment with care management team member scheduled for:  02/24/24 at 2 PM              The  patient verbalized understanding of instructions, educational materials, and care plan provided today and DECLINED offer to receive copy of patient instructions, educational materials, and care plan.   Care Guide will contact  patient to discuss resources related to housing and utilities Telephone follow up appointment with Managed Medicaid care management team member scheduled for: 02/24/24 at 2 PM  Theodora Fish, RN MSN [place]  VBCI Population Health RN Care Manager Direct Dial: [phone]  Fax: [phone]   Following is a copy of your plan of care:  There are no care plans that you recently modified to display for this patient.

## 2024-02-17 NOTE — Patient Outreach (Signed)
 Complex Care Management   Visit Note  02/17/2024  Name:  Tamara Church MRN: [id number] DOB: [birth year]  Situation: Referral received for Complex Care Management related to high utilization I obtained verbal consent from Patient.  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   Gastritis and gastroduodenitis 07/27/2020    Assessment: Patient Reported Symptoms:  Cognitive Cognitive Status: Able to follow simple commands, Alert and oriented to person, place, and time, Normal speech and language skills Cognitive/Intellectual Conditions Management [RPT]: None reported or documented in medical history or problem list   Health Maintenance Behaviors: Annual physical exam  Neurological Neurological Review of Symptoms: No symptoms reported    HEENT HEENT Symptoms Reported: No symptoms reported      Cardiovascular Cardiovascular Symptoms Reported: No symptoms reported Does patient have uncontrolled Hypertension?: No Cardiovascular Conditions: Hypertension Cardiovascular Management Strategies: Medication therapy Cardiovascular Comment: Patient notes that she does not have a BP cuff  Respiratory Respiratory Symptoms Reported: No symptoms reported    Endocrine Patient reports the following symptoms related to hypoglycemia or hyperglycemia : No symptoms reported    Gastrointestinal Gastrointestinal Symptoms Reported: Other, Nausea, Abdominal pain or discomfort Other Gastrointestinal Symptoms: "Mobility in my esophagus is not doing what it is supposed to do" Additional Gastrointestinal Details: Last BM "not too long ago" Gastrointestinal Conditions: Abdominal pain, Nausea, Other Other Gastrointestinal Conditions: Hiatal hernia, celiac aneurysm Gastrointestinal Self-Management Outcome: 1 (very  bad) Gastrointestinal Comment: Patient has appointment with [facility] this afternoon to discuss barium swallow. Note that she has had 9 ED visits in the last 6 months related to these symptoms. Next f/u with GI 03/07/24. Nutrition Risk Screen (CP): Difficulty chewing/swallowing, Unintentional loss of 10 lbs or more in the past 2 months (Over 30 lbs in the past year)  Genitourinary Genitourinary Symptoms Reported: No symptoms reported    Integumentary Integumentary Symptoms Reported: No symptoms reported    Musculoskeletal Musculoskelatal Symptoms Reviewed: No symptoms reported   Falls in the past year?: No Number of falls in past year: 1 or less Was there an injury with Fall?: No Fall Risk Category Calculator: 0 Patient Fall Risk Level: Low Fall Risk Patient at Risk for Falls Due to: No Fall Risks Fall risk Follow up: Falls evaluation completed  Psychosocial Psychosocial Symptoms Reported: No symptoms reported     Quality of Family Relationships: helpful, involved, supportive Do you feel physically threatened by others?: No      02/17/2024    2:31 PM  Depression screen PHQ 2/9  Decreased Interest 0  Down, Depressed, Hopeless 0  PHQ - 2 Score 0    There were no vitals filed for this visit.  Medications Reviewed Today     Reviewed by Valaria Garland, RN (Registered Nurse) on 02/17/24 at 1410  Med List Status: <None>   Medication Order Taking? Sig Documenting Provider Last Dose Status Informant  aluminum -magnesium  hydroxide 200-200 MG/5ML suspension [id number]  Take 15 mLs by mouth every 6 (six) hours as needed for indigestion. Small, Brooke L, PA  Active   amitriptyline  (ELAVIL ) 50 MG tablet [id number]  Take 1 tablet (50 mg total) by mouth at bedtime. Mansouraty, Albino Alu., MD  Active   amLODipine  (NORVASC ) 10 MG tablet [id number]  Take 1 tablet by mouth once daily Clem Currier, DO  Active   amoxicillin -clavulanate (  AUGMENTIN ) 875-125 MG tablet [id number]  Take  1 tablet by mouth every 12 (twelve) hours. Arvilla Birmingham, MD  Active   dexlansoprazole  (DEXILANT ) 60 MG capsule [id number]  Take 1 capsule (60 mg total) by mouth daily. Mansouraty, Albino Alu., MD  Active   famotidine  (PEPCID ) 20 MG tablet [id number]  Take 1 tablet by mouth twice daily  Patient not taking: Reported on 01/03/2024   Mansouraty, Gabriel Jr., MD  Active   folic acid  (FOLVITE ) 1 MG tablet [id number]  Take 1 tablet (1 mg total) by mouth daily. Mansouraty, Albino Alu., MD  Active   hydrOXYzine  (ATARAX ) 10 MG tablet [id number]  Take 1 tablet (10 mg total) by mouth 2 (two) times daily.  Patient not taking: Reported on 01/03/2024   Mansouraty, Albino Alu., MD  Active   loperamide  (IMODIUM ) 2 MG capsule [id number]  Take 1 capsule (2 mg total) by mouth 4 (four) times daily as needed for diarrhea or loose stools.  Patient not taking: Reported on 01/03/2024   Harris, Abigail, PA-C  Active   ondansetron  (ZOFRAN -ODT) 4 MG disintegrating tablet [id number]  Take 1 tablet (4 mg total) by mouth every 8 (eight) hours as needed for nausea or vomiting. Small, Brooke L, PA  Active   oxyCODONE -acetaminophen  (PERCOCET) 5-325 MG tablet [id number]  Take 1 tablet by mouth every 6 (six) hours as needed for up to 15 doses. Arvilla Birmingham, MD  Active   promethazine  (PHENERGAN ) 25 MG suppository [id number]  Place 1 suppository (25 mg total) rectally every 6 (six) hours as needed for nausea or vomiting.  Patient not taking: Reported on 01/03/2024   Rolinda Climes, DO  Active   promethazine  (PHENERGAN ) 25 MG suppository [id number]  Place 1 suppository (25 mg total) rectally every 6 (six) hours as needed for nausea or vomiting. Sueellen Emery, MD  Active   promethazine  (PHENERGAN ) 25 MG tablet [id number]  Take 1 tablet (25 mg total) by mouth every 6 (six) hours as needed for nausea or vomiting. Sueellen Emery, MD  Active   spironolactone  (ALDACTONE ) 25 MG tablet [id number]  Take 2 tablets (50 mg total) by mouth at  bedtime. Clem Currier, DO  Active   Vonoprazan Fumarate (VOQUEZNA) 20 MG TABS [id number]  Take 20 mg by mouth daily. Sueellen Emery, MD  Active             Recommendation:   Specialty provider follow-up [facility], GI, cardiology, and vascular surgery as scheduled Referral to: community care guide for SDOH needs Continue Current Plan of Care  Follow Up Plan:   Telephone follow up appointment date/time:  02/24/24 at 2 PM Referral to Care Guide  Theodora Fish, RN MSN [place]  [facility] RN Care Manager Direct Dial: [phone]  Fax: [phone]

## 2024-02-17 NOTE — Discharge Instructions (Addendum)
 Thank you for letting us  evaluate you today.  Your labs are pretty baseline unremarkable.  Please follow-up with surgery tomorrow for further management.  If you do not eat that is all right as long as you are able to keep fluids down.  You may drink chicken broth, Gatorade, Pedialyte  Return to emergency department if you experience intractable vomiting causing severe dehydration, significant worsening of pain

## 2024-02-17 NOTE — Telephone Encounter (Signed)
 Pharmacy Patient Advocate Encounter  Received notification from [facility] that Prior Authorization for Voquezna 20MG  tablets has been APPROVED from 02-11-2024 to 02-10-2025   PA #/Case ID/Reference #: [id number]

## 2024-02-21 ENCOUNTER — Other Ambulatory Visit: Payer: Self-pay

## 2024-02-21 DIAGNOSIS — K551 Chronic vascular disorders of intestine: Secondary | ICD-10-CM

## 2024-02-23 ENCOUNTER — Telehealth: Payer: Self-pay | Admitting: Gastroenterology

## 2024-02-23 NOTE — Telephone Encounter (Signed)
Records printed and faxed as requested. 

## 2024-02-23 NOTE — Telephone Encounter (Signed)
Received confirmation of successful fax.

## 2024-02-23 NOTE — Telephone Encounter (Signed)
 Inbound call from Dr. Georgine Kitchens office stating they have left medical records multiple message to receive patient 11/18/23 procedure and pathology reports. Also requested 08/29/2022 procedure and pathology reports. Requesting for records to be faxed to [phone]. Please advise, thank you

## 2024-02-24 ENCOUNTER — Other Ambulatory Visit: Payer: Self-pay

## 2024-02-24 ENCOUNTER — Telehealth: Payer: Self-pay | Admitting: Gastroenterology

## 2024-02-24 NOTE — Telephone Encounter (Signed)
 Nothing else currently. GM

## 2024-02-24 NOTE — Patient Instructions (Signed)
 Visit Information  Tamara Church was given information about Medicaid Managed Care team care coordination services as a part of their [facility] Community Plan Medicaid benefit. Tamara Church verbally consented to engagement with the [facility] Managed Care team.   If you are experiencing a medical emergency, please call 911 or report to your local emergency department or urgent care.   If you have a non-emergency medical problem during routine business hours, please contact your provider's office and ask to speak with a nurse.   For questions related to your [facility], please call: [phone] or visit the homepage here: [email or web address]  If you would like to schedule transportation through your [facility], please call the following number at least 2 days in advance of your appointment: [phone]   Rides for urgent appointments can also be made after hours by calling Member Services.  Call the Behavioral Health Crisis Line at [phone], at any time, 24 hours a day, 7 days a week. If you are in danger or need immediate medical attention call 911.  If you would like help to quit smoking, call 1-800-QUIT-NOW ((925)189-8256) OR Espaol: 1-855-Djelo-Ya (1-324-401-0272) o para ms informacin haga clic aqu or Text READY to 536-644 to register via text  Tamara Church:   Goals Addressed             This Visit's Progress    VBCI RN Care Plan related to GERD/dysphagia   On track    Problems:  Care Coordination needs related to Housing  and utility insecurity due to decreased hours at work due to illness Chronic Disease Management support and education needs related to GERD and dysphagia  Goal: Over the next 30 days the Patient will attend all scheduled medical appointments: [facility] Surgery, GI, vascular  surgery, and cardiology as evidenced by completed visit notes uploaded to EMR        demonstrate a decrease GERD in exacerbations as evidenced by decreased visits to the emergency room (9 in the past 6 months) take all medications exactly as prescribed and will call provider for medication related questions as evidenced by patient report of medication compliance    work with community resource care guide to address needs related to Housing barriers and utility barriers as evidenced by patient and/or community resource care guide support     Interventions:   Evaluation of current treatment plan related to GERD and dysphagia, Housing barriers and utility barriers self-management and patient's adherence to plan as established by provider. Discussed plans with patient for ongoing care management follow up and provided patient with direct contact information for care management team Evaluation of current treatment plan related to GERD/dysphagia and patient's adherence to plan as established by provider Advised patient to call [facility] to request contact information for Esophageal Center at [facility] to inquire about referral/schedule appointment. CMRN offered to assist patient with referral follow-up. Reviewed medications with patient and discussed medication compliance as advised by GI Care Guide referral for SDOH needs Discussed plans with patient for ongoing care management follow up and provided patient with direct contact information for care management team  Patient Self-Care Activities:  Attend all scheduled provider appointments Call provider office for new concerns or questions  Take medications as prescribed    Plan:  Telephone follow up appointment with care management team member scheduled for:  03/08/24 at 2:30 PM  The patient verbalized understanding of instructions, educational materials, and care plan provided Church and DECLINED offer to receive copy of  patient instructions, educational materials, and care plan.   The Patient                                              will call [facility]* as advised to obtain contact information for esophageal center at [facility] where referral was sent.  Telephone follow up appointment with Managed Medicaid care management team member scheduled for: 03/08/24 at 2:30 PM  Tamara Fish, RN MSN [place]  VBCI Population Health RN Care Manager Direct Dial: [phone]  Fax: [phone]   Following is a copy of your plan of care:  There are no care plans that you recently modified to display for this patient.

## 2024-02-24 NOTE — Telephone Encounter (Signed)
 Inbound call from patient stating she is still choking on food and feel as though food gets stuck and sits in her esophagus. Patient is requesting a call to discuss options to help. Please advise, thank you.

## 2024-02-24 NOTE — Patient Outreach (Signed)
 Complex Care Management   Visit Note  02/24/2024  Name:  Tamara Church MRN: [id number] DOB: 12-02-1975  Situation: Referral received for Complex Care Management related to GERD/dysphagia I obtained verbal consent from Patient.  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   Gastritis and gastroduodenitis 07/27/2020    Assessment: Patient Reported Symptoms:  Cognitive Cognitive Status: Able to follow simple commands, Alert and oriented to person, place, and time, Normal speech and language skills Cognitive/Intellectual Conditions Management [RPT]: None reported or documented in medical history or problem list      Neurological Neurological Review of Symptoms: Not assessed    HEENT HEENT Symptoms Reported: Not assessed      Cardiovascular Cardiovascular Symptoms Reported: Not assessed    Respiratory Respiratory Symptoms Reported: Not assesed    Endocrine Patient reports the following symptoms related to hypoglycemia or hyperglycemia : Not assessed    Gastrointestinal Gastrointestinal Symptoms Reported: Nausea, Abdominal pain or discomfort, Diarrhea Additional Gastrointestinal Details: Patient reports "really bad" nausea overnight and diarrhea this morning. She has been able to keep water down. Gastrointestinal Conditions: Abdominal pain, Nausea, Other Other Gastrointestinal Conditions: Hiatal hernia, celiac aneurysm Gastrointestinal Management Strategies: Diet modification, Medication therapy Gastrointestinal Self-Management Outcome: 1 (very bad) Gastrointestinal Comment: Patient is being followed by GI, [facility], and vascular surgery. Last saw [facility] 02/17/24. She reports a referral was sent to esophageal center at [facility]. She has not heard from them yet. Patient plans to call [facility] to  request contact information for where referral was sent so she can call them. Nutrition Risk Screen (CP): Difficulty chewing/swallowing, Unintentional loss of 10 lbs or more in the past 2 months (Over 30 lbs in the past year)  Genitourinary Genitourinary Symptoms Reported: Not assessed    Integumentary Integumentary Symptoms Reported: Not assessed    Musculoskeletal Musculoskelatal Symptoms Reviewed: Not assessed        Psychosocial Psychosocial Symptoms Reported: Not assessed            02/17/2024    2:31 PM  Depression screen PHQ 2/9  Decreased Interest 0  Down, Depressed, Hopeless 0  PHQ - 2 Score 0    There were no vitals filed for this visit.  Medications Reviewed Today     Reviewed by Valaria Garland, RN (Registered Nurse) on 02/24/24 at 1409  Med List Status: <None>   Medication Order Taking? Sig Documenting Provider Last Dose Status Informant  aluminum -magnesium  hydroxide 200-200 MG/5ML suspension [id number] No Take 15 mLs by mouth every 6 (six) hours as needed for indigestion.  Patient not taking: Reported on 02/17/2024   Small, Brooke L, PA Not Taking Active   amitriptyline  (ELAVIL ) 50 MG tablet [id number] No Take 1 tablet (50 mg total) by mouth at bedtime.  Patient not taking: Reported on 02/17/2024   Mansouraty, Albino Alu., MD Not Taking Active   amLODipine  (NORVASC ) 10 MG tablet [id number] No Take 1 tablet by mouth once daily Clem Currier, DO Taking Active   amoxicillin -clavulanate (AUGMENTIN ) 875-125 MG tablet [id number] No Take 1 tablet by mouth every 12 (twelve) hours.  Patient not taking: Reported on 02/17/2024   Arvilla Birmingham, MD Not Taking Active            Med Note (Dannia Snook P   Thu Feb 17, 2024  2:11 PM) Completed  course of antibiotic  dexlansoprazole  (DEXILANT ) 60 MG capsule [id number] No Take 1 capsule (60 mg total) by mouth daily. Mansouraty, Albino Alu., MD Taking Active   famotidine  (PEPCID ) 20 MG tablet [id number] No Take 1 tablet by  mouth twice daily  Patient not taking: Reported on 02/17/2024   Mansouraty, Albino Alu., MD Not Taking Active   folic acid  (FOLVITE ) 1 MG tablet [id number] No Take 1 tablet (1 mg total) by mouth daily. Mansouraty, Albino Alu., MD Taking Active   hydrOXYzine  (ATARAX ) 10 MG tablet [id number] No Take 1 tablet (10 mg total) by mouth 2 (two) times daily. Mansouraty, Albino Alu., MD Taking Active            Med Note (Gal Smolinski P   Thu Feb 17, 2024  2:12 PM) Patient taking as needed  loperamide  (IMODIUM ) 2 MG capsule [id number] No Take 1 capsule (2 mg total) by mouth 4 (four) times daily as needed for diarrhea or loose stools. Harris, Abigail, PA-C Taking Active   ondansetron  (ZOFRAN -ODT) 4 MG disintegrating tablet [id number] No Take 1 tablet (4 mg total) by mouth every 8 (eight) hours as needed for nausea or vomiting. Small, Brooke L, PA Taking Active   oxyCODONE -acetaminophen  (PERCOCET) 5-325 MG tablet [id number] No Take 1 tablet by mouth every 6 (six) hours as needed for up to 15 doses. Arvilla Birmingham, MD Taking Active   promethazine  (PHENERGAN ) 25 MG suppository [id number] No Place 1 suppository (25 mg total) rectally every 6 (six) hours as needed for nausea or vomiting. Rolinda Climes, DO Taking Active   promethazine  (PHENERGAN ) 25 MG suppository [id number] No Place 1 suppository (25 mg total) rectally every 6 (six) hours as needed for nausea or vomiting. Sueellen Emery, MD Taking Active   promethazine  (PHENERGAN ) 25 MG tablet [id number] No Take 1 tablet (25 mg total) by mouth every 6 (six) hours as needed for nausea or vomiting. Sueellen Emery, MD Taking Active   spironolactone  (ALDACTONE ) 25 MG tablet [id number] No Take 2 tablets (50 mg total) by mouth at bedtime. Clem Currier, DO Taking Active   Vonoprazan Fumarate  (VOQUEZNA ) 20 MG TABS [id number] No Take 20 mg by mouth daily.  Patient not taking: Reported on 02/17/2024   Sueellen Emery, MD Not Taking Active            Med Note (Geena Weinhold,  Duaine German Feb 17, 2024  2:13 PM) Patient reports they are waiting for medication to be approved            Recommendation:   Specialty provider follow-up GI, [facility], vascular surgery Continue Current Plan of Care  Follow Up Plan:   Telephone follow up appointment date/time:  03/08/24 at 2:30 PM  Theodora Fish, RN MSN [place]  [facility] RN Care Manager Direct Dial: [phone]  Fax: [phone]

## 2024-02-24 NOTE — Telephone Encounter (Signed)
 Returned call to patient, she reports that she is having a hard time swallowing foods. Not happening everyday, but very frequent. "It feels like something hard is stuck in my throat." Reports roughly 20 pound weight loss from May to June because she hasn't been able to eat a lot. Hasn't noticed a particular type of food that causes sensation. Patient reports frequent nausea, occasional minimal vomiting however not frequent. Patient reports she has attempted to self induce vomiting due to feeling like food is stuck, however, "It makes my stomach hurt really bad and I try not to do it."Taking Voquezna  every day. Patient has a f/u scheduled for 06/17, but would like to know if there is something she can do in the interim.

## 2024-02-25 NOTE — Telephone Encounter (Signed)
 Notified patient of provider recommendations. Verbalized understanding.

## 2024-02-29 ENCOUNTER — Telehealth: Payer: Self-pay

## 2024-02-29 ENCOUNTER — Encounter (HOSPITAL_COMMUNITY): Payer: Self-pay

## 2024-02-29 ENCOUNTER — Other Ambulatory Visit: Payer: Self-pay

## 2024-02-29 ENCOUNTER — Emergency Department (HOSPITAL_COMMUNITY)
Admission: EM | Admit: 2024-02-29 | Discharge: 2024-02-29 | Disposition: A | Discharge status: Home / Self Care | Attending: Emergency Medicine | Admitting: Emergency Medicine

## 2024-02-29 DIAGNOSIS — R110 Nausea: Secondary | ICD-10-CM | POA: Insufficient documentation

## 2024-02-29 DIAGNOSIS — Z79899 Other long term (current) drug therapy: Secondary | ICD-10-CM | POA: Insufficient documentation

## 2024-02-29 DIAGNOSIS — R197 Diarrhea, unspecified: Secondary | ICD-10-CM | POA: Insufficient documentation

## 2024-02-29 DIAGNOSIS — I10 Essential (primary) hypertension: Secondary | ICD-10-CM | POA: Diagnosis not present

## 2024-02-29 DIAGNOSIS — R112 Nausea with vomiting, unspecified: Secondary | ICD-10-CM | POA: Diagnosis not present

## 2024-02-29 LAB — CBC
HCT: 36.4 % (ref 36.0–46.0)
Hemoglobin: 11.9 g/dL — ABNORMAL LOW (ref 12.0–15.0)
MCH: 29.6 pg (ref 26.0–34.0)
MCHC: 32.7 g/dL (ref 30.0–36.0)
MCV: 90.5 fL (ref 80.0–100.0)
Platelets: 233 10*3/uL (ref 150–400)
RBC: 4.02 MIL/uL (ref 3.87–5.11)
RDW: 13.2 % (ref 11.5–15.5)
WBC: 8.8 10*3/uL (ref 4.0–10.5)
nRBC: 0 % (ref 0.0–0.2)

## 2024-02-29 LAB — COMPREHENSIVE METABOLIC PANEL WITH GFR
ALT: 13 U/L (ref 0–44)
AST: 16 U/L (ref 15–41)
Albumin: 4.4 g/dL (ref 3.5–5.0)
Alkaline Phosphatase: 56 U/L (ref 38–126)
Anion gap: 11 (ref 5–15)
BUN: 17 mg/dL (ref 6–20)
CO2: 23 mmol/L (ref 22–32)
Calcium: 9.9 mg/dL (ref 8.9–10.3)
Chloride: 102 mmol/L (ref 98–111)
Creatinine, Ser: 1.02 mg/dL — ABNORMAL HIGH (ref 0.44–1.00)
GFR, Estimated: >60 mL/min (ref >60)
Glucose, Bld: 123 mg/dL — ABNORMAL HIGH (ref 70–99)
Potassium: 4.1 mmol/L (ref 3.5–5.1)
Sodium: 136 mmol/L (ref 135–145)
Total Bilirubin: 0.5 mg/dL (ref 0.0–1.2)
Total Protein: 8.4 g/dL — ABNORMAL HIGH (ref 6.5–8.1)

## 2024-02-29 LAB — URINALYSIS, ROUTINE W REFLEX MICROSCOPIC
Bilirubin Urine: NEGATIVE
Glucose, UA: NEGATIVE mg/dL
Hgb urine dipstick: NEGATIVE
Ketones, ur: NEGATIVE mg/dL
Leukocytes,Ua: NEGATIVE
Nitrite: NEGATIVE
Protein, ur: NEGATIVE mg/dL
Specific Gravity, Urine: 1.002 — ABNORMAL LOW (ref 1.005–1.030)
pH: 6 (ref 5.0–8.0)

## 2024-02-29 LAB — LIPASE, BLOOD: Lipase: 32 U/L (ref 11–51)

## 2024-02-29 LAB — PREGNANCY, URINE: Preg Test, Ur: NEGATIVE

## 2024-02-29 MED ORDER — PROCHLORPERAZINE EDISYLATE 10 MG/2ML IJ SOLN
10.0000 mg | Freq: Once | INTRAMUSCULAR | Status: AC
  Administered 2024-02-29: 10 mg via INTRAVENOUS
  Filled 2024-02-29: qty 2

## 2024-02-29 MED ORDER — METOCLOPRAMIDE HCL 5 MG/ML IJ SOLN
10.0000 mg | Freq: Once | INTRAMUSCULAR | Status: AC
  Administered 2024-02-29: 10 mg via INTRAVENOUS
  Filled 2024-02-29: qty 2

## 2024-02-29 MED ORDER — METOCLOPRAMIDE HCL 10 MG PO TABS: 10.0000 mg | ORAL_TABLET | Freq: Four times a day (QID) | ORAL | 0 refills | Status: DC

## 2024-02-29 NOTE — ED Notes (Signed)
 Patient Tamara Church tolerated well. Denies N/V.

## 2024-02-29 NOTE — Discharge Instructions (Addendum)
 Your workup this evening was reassuring.  You may take the prescribed medication for nausea.  If you develop any life-threatening symptoms such as inability to tolerate oral fluids even with the nausea medication you should return to the emergency department for further evaluation.

## 2024-02-29 NOTE — Progress Notes (Signed)
   Telephone encounter was:  Successful.  Complex Care Management Note Care Guide Note  02/29/2024 Name: ROSALVA NEARY MRN: [id number] DOB: 1975/11/19  Morey Ar is a 48 y.o. year old female who is a primary care patient of Clem Currier, DO . The community resource team was consulted for assistance with Utilities  SDOH screenings and interventions completed:  Yes  Social Drivers of Health From This Encounter   Food Insecurity: No Food Insecurity (02/29/2024)   Hunger Vital Sign    Worried About Running Out of Food in the Last Year: Never true    Ran Out of Food in the Last Year: Never true  Housing: High Risk (02/29/2024)   Housing Stability Vital Sign    Unable to Pay for Housing in the Last Year: Yes    Number of Times Moved in the Last Year: 0    Homeless in the Last Year: No  Financial Resource Strain: High Risk (02/29/2024)   Overall Financial Resource Strain (CARDIA)    Difficulty of Paying Living Expenses: Hard  Utilities: At Risk (02/29/2024)   Utilities    Threatened with loss of utilities: Yes    SDOH Interventions Today    Flowsheet Row Most Recent Value  SDOH Interventions   Housing Interventions Community Resources Provided  Utilities Interventions Community Resources Provided, [id number] Referral  Financial Strain Interventions Community Resources Provided, [facility] Referral        Care guide performed the following interventions: Patient provided with information about care guide support team and interviewed to confirm resource needs.Patient stated she is having financial strain and her power will be cut off in a couple weeks. Pt requested I give resources over the phone. [email or web address]   Follow Up Plan:  No further follow up planned at this time. The patient has been provided with needed resources.  Encounter Outcome:  Patient Visit Completed   Azell Leopard [facility]  [facility] Guide, Phone: [phone] Fax:  [phone] Website: [email or web address]

## 2024-02-29 NOTE — ED Provider Triage Note (Signed)
 Emergency Medicine Provider Triage Evaluation Note  Tamara Church , a 48 y.o. female  was evaluated in triage.  Pt complains of 12 hour hx of persistent nausea, followed by GI for ineffective esophageal motility, taking Zofran  w/o effect.  Also took Phenergan  without effect 1830.  Feels as if she needs to vomit, but not able to, c/o globus sensation.    Review of Systems  Positive: Nausea,  Negative: Vomiting  Physical Exam  BP (!) 140/103   Pulse 94   Temp 98.4 F (36.9 C) (Oral)   Resp 20   LMP 02/10/2024 (Approximate)   SpO2 97%  Gen:   Awake, no distress noted persistent retching during evaluation. Resp:  Normal effort  MSK:   Moves extremities without difficulty  Other:  Remainder physical exam unremarkable, lungs are clear and equal bilaterally, abdomen is nontender.  Medical Decision Making  Medically screening exam initiated at 9:14 PM.  Appropriate orders placed.  Morey Ar was informed that the remainder of the evaluation will be completed by another provider, this initial triage assessment does not replace that evaluation, and the importance of remaining in the ED until their evaluation is complete.  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  Also urine screening for pregnancy has been initiated.  Will manage nausea with IV metoclopramide  and IV Compazine .   Juanetta Nordmann, [place] 02/29/24 2137

## 2024-02-29 NOTE — ED Triage Notes (Signed)
 Arrives for nausea and diarrhea since 4:30 pm. States that she has not vomited. Belching in triage. Endorses epigastric abdominal pain.

## 2024-02-29 NOTE — ED Provider Notes (Signed)
 [place] EM[facility]ENC[facility] DEPARTMENT AT [facility] Provider Note   CSN: [id number] Arrival date & time: 02/29/24  2041     History  Chief Complaint  Patient presents with   Nausea    Tamara Church is a 48 y.o. female.  Patient with past medical history significant for hypertension, GERD, gastritis, chronic abdominal pain presents to the emergency room complaining of nausea which began at 4:30 PM.  She also endorses 1 episode of diarrhea.  She has not vomited.  Upon arrival patient Dors is epigastric pain.  During my assessment patient denies any abdominal pain.  She states she was given IV medications ordered by triage provider and is feeling much better.  She denies chest pain, shortness of breath, fever, urinary symptoms.  HPI     Home Medications Prior to Admission medications   Medication Sig Start Date End Date Taking? Authorizing Provider  metoCLOPramide  (REGLAN ) 10 MG tablet Take 1 tablet (10 mg total) by mouth every 6 (six) hours. 02/29/24  Yes Elisa Guest, PA-C  aluminum -magnesium  hydroxide 200-200 MG/5ML suspension Take 15 mLs by mouth every 6 (six) hours as needed for indigestion. Patient not taking: Reported on 02/17/2024 01/06/24   Small, Brooke L, PA  amitriptyline  (ELAVIL ) 50 MG tablet Take 1 tablet (50 mg total) by mouth at bedtime. Patient not taking: Reported on 02/17/2024 01/11/24   Mansouraty, Albino Alu., MD  amLODipine  (NORVASC ) 10 MG tablet Take 1 tablet by mouth once daily 01/31/24   Clem Currier, DO  amoxicillin -clavulanate (AUGMENTIN ) 875-125 MG tablet Take 1 tablet by mouth every 12 (twelve) hours. Patient not taking: Reported on 02/17/2024 02/10/24   Arvilla Birmingham, MD  dexlansoprazole  (DEXILANT ) 60 MG capsule Take 1 capsule (60 mg total) by mouth daily. 11/19/23   Mansouraty, Albino Alu., MD  famotidine  (PEPCID ) 20 MG tablet Take 1 tablet by mouth twice daily Patient not taking: Reported on 02/17/2024 12/16/23   Mansouraty, Albino Alu., MD   folic acid  (FOLVITE ) 1 MG tablet Take 1 tablet (1 mg total) by mouth daily. 10/13/23   Mansouraty, Albino Alu., MD  hydrOXYzine  (ATARAX ) 10 MG tablet Take 1 tablet (10 mg total) by mouth 2 (two) times daily. 07/21/23   Mansouraty, Albino Alu., MD  loperamide  (IMODIUM ) 2 MG capsule Take 1 capsule (2 mg total) by mouth 4 (four) times daily as needed for diarrhea or loose stools. 09/14/23   Harris, Abigail, PA-C  ondansetron  (ZOFRAN -ODT) 4 MG disintegrating tablet Take 1 tablet (4 mg total) by mouth every 8 (eight) hours as needed for nausea or vomiting. 01/06/24   Small, Brooke L, PA  oxyCODONE -acetaminophen  (PERCOCET) 5-325 MG tablet Take 1 tablet by mouth every 6 (six) hours as needed for up to 15 doses. 02/10/24   Arvilla Birmingham, MD  promethazine  (PHENERGAN ) 25 MG suppository Place 1 suppository (25 mg total) rectally every 6 (six) hours as needed for nausea or vomiting. 07/24/23   Rolinda Climes, DO  promethazine  (PHENERGAN ) 25 MG suppository Place 1 suppository (25 mg total) rectally every 6 (six) hours as needed for nausea or vomiting. 02/11/24   Sueellen Emery, MD  promethazine  (PHENERGAN ) 25 MG tablet Take 1 tablet (25 mg total) by mouth every 6 (six) hours as needed for nausea or vomiting. 02/11/24   Sueellen Emery, MD  spironolactone  (ALDACTONE ) 25 MG tablet Take 2 tablets (50 mg total) by mouth at bedtime. 09/30/23   Clem Currier, DO  Vonoprazan Fumarate  (VOQUEZNA ) 20 MG TABS Take 20 mg by mouth  daily. Patient not taking: Reported on 02/17/2024 02/11/24   Haviland, Julie, MD      Allergies    Hctz [hydrochlorothiazide ], Ace inhibitors, and Angiotensin receptor blockers    Review of Systems   Review of Systems  Physical Exam Updated Vital Signs BP 128/85   Pulse 85   Temp 98.4 F (36.9 C) (Oral)   Resp 20   LMP 02/10/2024 (Approximate)   SpO2 94%  Physical Exam Vitals and nursing note reviewed.  Constitutional:      General: She is not in acute distress.    Appearance: She is  well-developed.  HENT:     Head: Normocephalic and atraumatic.  Eyes:     Conjunctiva/sclera: Conjunctivae normal.  Cardiovascular:     Rate and Rhythm: Normal rate and regular rhythm.  Pulmonary:     Effort: Pulmonary effort is normal. No respiratory distress.     Breath sounds: Normal breath sounds.  Abdominal:     Palpations: Abdomen is soft.     Tenderness: There is no abdominal tenderness.  Musculoskeletal:        General: No swelling.     Cervical back: Neck supple.  Skin:    General: Skin is warm and dry.     Capillary Refill: Capillary refill takes less than 2 seconds.  Neurological:     Mental Status: She is alert.  Psychiatric:        Mood and Affect: Mood normal.     ED Results / Procedures / Treatments   Labs (all labs ordered are listed, but only abnormal results are displayed) Labs Reviewed  COMPREHENSIVE METABOLIC PANEL WITH GFR - Abnormal; Notable for the following components:      Result Value   Glucose, Bld 123 (*)    Creatinine, Ser 1.02 (*)    Total Protein 8.4 (*)    All other components within normal limits  CBC - Abnormal; Notable for the following components:   Hemoglobin 11.9 (*)    All other components within normal limits  URINALYSIS, ROUTINE W REFLEX MICROSCOPIC - Abnormal; Notable for the following components:   Color, Urine COLORLESS (*)    Specific Gravity, Urine 1.002 (*)    All other components within normal limits  LIPASE, BLOOD  PREGNANCY, URINE    EKG EKG Interpretation Date/Time:  Tuesday February 29 2024 21:32:49 EDT Ventricular Rate:  95 PR Interval:  148 QRS Duration:  91 QT Interval:  352 QTC Calculation: 443 R Axis:   16  Text Interpretation: Sinus rhythm Low voltage, precordial leads Confirmed by Eldon Greenland ([id number]) on 02/29/2024 11:10:39 PM  Radiology No results found.  Procedures Procedures    Medications Ordered in ED Medications  prochlorperazine  (COMPAZINE ) injection 10 mg (10 mg Intravenous Given  02/29/24 2145)  metoCLOPramide  (REGLAN ) injection 10 mg (10 mg Intravenous Given 02/29/24 2143)    ED Course/ Medical Decision Making/ A&P                                 Medical Decision Making  This patient presents to the ED for concern of nausea and diarrhea, this involves an extensive number of treatment options, and is a complaint that carries with it a high risk of complications and morbidity.  The differential diagnosis includes gastroenteritis, colitis, appendicitis, others   Co morbidities / Chronic conditions that complicate the patient evaluation  Chronic abdominal pain   Additional history obtained:  Additional history obtained  from EMR   Lab Tests:  I Ordered, and personally interpreted labs.  The pertinent results include: Grossly unremarkable CBC, CMP, lipase, UA; negative pregnancy test   Imaging Studies ordered:  No abdominal tenderness on exam.  No sign of surgical/acute abdomen.  No indication for emergent abdominal imaging.   Cardiac Monitoring: / EKG:  The patient was maintained on a cardiac monitor.  I personally viewed and interpreted the cardiac monitored which showed an underlying rhythm of: Sinus rhythm   Problem List / ED Course / Critical interventions / Medication management   I ordered medication including compazine  and reglan    Reevaluation of the patient after these medicines showed that the patient improved I have reviewed the patients home medicines and have made adjustments as needed   Social Determinants of Health:  Patient has Medicaid for her primary health insurance type   Test / Admission - Considered:  Patient with no abdominal tenderness on exam.  She is tolerating oral intake at this time.  Symptoms have subsided.  Suspect gastroenteritis.  No physical exam findings concerning for any sort of surgical or acute abdomen.  Patient stable for discharge home.  Will prescribe short course of Reglan .  Return precautions  provided.         Final Clinical Impression(s) / ED Diagnoses Final diagnoses:  Nausea  Diarrhea, unspecified type    Rx / DC Orders ED Discharge Orders          Ordered    metoCLOPramide  (REGLAN ) 10 MG tablet  Every 6 hours        02/29/24 2333              Delories Fetter 02/29/24 2334    Eldon Greenland, MD 03/01/24 [id number]

## 2024-03-06 NOTE — Progress Notes (Signed)
 03/07/2024 Tamara Church [id number] [birth year]  Referring provider: Clem Currier, DO Primary GI doctor: Dr. Brice Campi  ASSESSMENT AND PLAN:  GERD with small hiatal hernia with chronic AB pain/nausea/vomiting Dysphagia with chicken/cake etc that will result in nausea, AB pain, gagging that will not stop, will drink water to try to push down, no regurg Status post cholecystectomy 09/2022 07/2022 GES normal at 98% at 3 hours 10/2022 EGD 2 cm HH, gastritis Bravo test off PPI normal exposure Esophageal manometry ineffective motility, normal EG junction relaxation  Previously on dexilant  but that was not working, now on voquenzna with some help Reglan  did not help, zofran  does not help, phenergan  helped the most No diarrhea, no constipation at this time, no melena, no hematochezia Anxiety may exacerbate symptoms. - Prescribed hyoscyamine as needed for esophageal spasms. - Prescribed amitriptyline  10 mg at night for esophageal hypersensitivity and anxiety. - Advised to keep the appointment with [facility] for further evaluation of esophageal dysmotility/symptoms - Suggested peppermint Altoids as a natural antispasmodic during episodes. - can consider CT with deep inspiration to rule out MATS with weight loss but this has been rather gradual  Abdominal pain 02/10/2024 CT abdomen pelvis with contrast for nausea diarrhea rectosigmoid colon underdistention or colitis small hiatal hernia 01/04/2024 abdominal pain unremarkable bowels urinary bladder wall thickening 12/07/2023 CT abdomen pelvis with contrast for abdominal pain unremarkable No diarrhea, no constipation at this time, no melena, no hematochezia Normal colonoscopy 2024 AB pain is after episodes of esophageal dysphagia  Screening colonoscopy  2024 normal other than hemorrhoids, recall 10 years  Patient Care Team: Clem Currier, DO as PCP - General (Family Medicine) Harlee Lichtenstein, CNM as Midwife (Obstetrics and  Gynecology) Valaria Garland, RN as Registered Nurse  HISTORY OF PRESENT ILLNESS: 48 y.o. female with a past medical history of hypertension, anemia, anxiety cholelithiasis status post CCK, hiatal hernia, GERD and others listed below presents for evaluation of GERD.   Last seen by Loa Riling, PA 12/2023 for GERD and AB pain. Has appointment with [facility] on July 18th for second opinion.   Discussed the use of AI scribe software for clinical note transcription with the patient, who gave verbal consent to proceed.  History of Present Illness   Tamara Church is a 48 year old female with esophageal dysmotility who presents with difficulty swallowing and weight loss. She was referred by a [place] to [facility] for a second opinion regarding her esophageal issues.  She has been experiencing difficulty swallowing for the past three to four weeks, with food getting stuck in her esophagus, particularly with foods like chicken breast and cake. This has led to multiple hospital visits. She experiences nausea and belching when food gets stuck, but no regurgitation occurs. Drinking water sometimes helps, but she often requires medical intervention.  She has experienced significant weight loss since 2021, dropping from around 200 pounds to 172 pounds. She attributes this to her difficulty eating and subsequent avoidance of food due to fear of symptoms. No constipation or diarrhea, except occasional constipation when taking certain medications.  Her current medications include Binosto, iron supplements, acid reflux medication, and blood pressure medication. She has tried various treatments for her symptoms, including Phenergan , Zofran , and Reglan , with varying degrees of success. Phenergan  is noted to help the most with nausea.  An endoscopy revealed a benign polyp. She has a history of anxiety, which she associates with her symptoms, particularly after the loss of her son in 2021.  Family  history is  significant for reflux, as both her parents had it. She denies alcohol use, drug use, and anti-inflammatory medication use.     She  reports that she has never smoked. She has never used smokeless tobacco. She reports that she does not drink alcohol and does not use drugs.    RELEVANT GI HISTORY, IMAGING AND LABS: Results   DIAGNOSTIC EGD: Benign polyp     2/25 EGD Bravo - Normal esophagus. - 2 cm hiatal hernia. - One gastric polyp. Resected and retrieved. - Normal examined duodenum. - The BRAVO pH capsule was deployed.   Esophageal Manometry  Bravo test (done OFF of PPI therapy), which showed normal total acid exposure of 0.1% and normal DeMeester score of 1.5.  Esophageal exposure to acid reflux is also normal in the upright and supine positions.  There was negative symptom correlation for heartburn, dysphagia, based upon the patient's symptom association probability.  Thus patient's Bravo study was negative for evidence of significant reflux.  Would consider functional heartburn as etiology of the patient's symptoms.    2024 Colonoscopy - Hemorrhoids found on digital rectal exam. - The examined portion of the ileum was normal. - Normal mucosa in the entire examined colon. - Non-bleeding non-thrombosed internal hemorrhoids   2024 EGD - No gross lesions in the entire esophagus. Biopsied. - Z-line irregular, 39 cm from the incisors. - 2 cm hiatal hernia. - Erythematous mucosa in the stomach. Biopsied. - No gross lesions in the duodenal bulb, in the first portion of the duodenum and in the second portion of the duodenum. CBC    Component Value Date/Time   WBC 8.8 02/29/2024 2101   RBC 4.02 02/29/2024 2101   HGB 11.9 (L) 02/29/2024 2101   HCT 36.4 02/29/2024 2101   PLT 233 02/29/2024 2101   MCV 90.5 02/29/2024 2101   MCH 29.6 02/29/2024 2101   MCHC 32.7 02/29/2024 2101   RDW 13.2 02/29/2024 2101   LYMPHSABS 1.1 02/11/2024 0841   MONOABS 0.3 02/11/2024 0841   EOSABS 0.0 02/11/2024  0841   BASOSABS 0.0 02/11/2024 0841   Recent Labs    10/14/23 1532 10/16/23 1744 12/07/23 1638 01/04/24 0838 01/05/24 1938 01/26/24 1535 02/10/24 1002 02/11/24 0841 02/16/24 2024 02/29/24 2101  HGB 11.7* 12.0 12.5 12.1 12.2 11.7* 12.9 13.3 11.8* 11.9*    CMP     Component Value Date/Time   NA 136 02/29/2024 2101   NA 140 08/02/2023 1459   K 4.1 02/29/2024 2101   CL 102 02/29/2024 2101   CO2 23 02/29/2024 2101   GLUCOSE 123 (H) 02/29/2024 2101   BUN 17 02/29/2024 2101   BUN 12 08/02/2023 1459   CREATININE 1.02 (H) 02/29/2024 2101   CREATININE 1.28 (H) 03/18/2012 1013   CALCIUM 9.9 02/29/2024 2101   PROT 8.4 (H) 02/29/2024 2101   PROT 7.4 06/04/2023 1643   ALBUMIN 4.4 02/29/2024 2101   ALBUMIN 4.3 06/04/2023 1643   AST 16 02/29/2024 2101   ALT 13 02/29/2024 2101   ALKPHOS 56 02/29/2024 2101   BILITOT 0.5 02/29/2024 2101   BILITOT 0.3 06/04/2023 1643   GFRNONAA >60 02/29/2024 2101   GFRAA >60 05/26/2020 0736      Latest Ref Rng & Units 02/29/2024    9:01 PM 02/16/2024    8:24 PM 02/11/2024    8:41 AM  Hepatic Function  Total Protein 6.5 - 8.1 g/dL 8.4  8.5  9.4   Albumin 3.5 - 5.0 g/dL 4.4  4.3  4.6  AST 15 - 41 U/L 16  17  99   ALT 0 - 44 U/L 13  31  178   Alk Phosphatase 38 - 126 U/L 56  54  65   Total Bilirubin 0.0 - 1.2 mg/dL 0.5  0.8  1.3       Current Medications:    Current Outpatient Medications (Cardiovascular):    amLODipine  (NORVASC ) 10 MG tablet, Take 1 tablet by mouth once daily   spironolactone  (ALDACTONE ) 25 MG tablet, Take 2 tablets (50 mg total) by mouth at bedtime.  Current Outpatient Medications (Respiratory):    promethazine  (PHENERGAN ) 25 MG tablet, Take 1 tablet (25 mg total) by mouth every 6 (six) hours as needed for nausea or vomiting.   Current Outpatient Medications (Hematological):    folic acid  (FOLVITE ) 1 MG tablet, Take 1 tablet (1 mg total) by mouth daily.  Current Outpatient Medications (Other):    amitriptyline   (ELAVIL ) 10 MG tablet, Take 1 tablet (10 mg total) by mouth at bedtime.   hyoscyamine (LEVSIN SL) 0.125 MG SL tablet, Place 1 tablet (0.125 mg total) under the tongue every 6 (six) hours as needed for cramping (spasm of esophagus).   Vonoprazan Fumarate  (VOQUEZNA ) 20 MG TABS, Take 20 mg by mouth daily.   hydrOXYzine  (ATARAX ) 10 MG tablet, Take 1 tablet (10 mg total) by mouth 2 (two) times daily.   metoCLOPramide  (REGLAN ) 10 MG tablet, Take 1 tablet (10 mg total) by mouth every 6 (six) hours.  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 Family History:  Her family history includes Hypertension in her mother.  REVIEW OF SYSTEMS  : All other systems reviewed and negative except where noted in the History of Present Illness.  PHYSICAL EXAM: BP 102/74   Ht 5' 1 (1.549 m)   Wt 172 lb 8 oz (78.2 kg)   LMP 02/10/2024 (Approximate)   BMI 32.59 kg/m  Physical Exam   GENERAL APPEARANCE: Well nourished, in no apparent distress HEENT: No cervical lymphadenopathy, unremarkable thyroid , sclerae anicteric, conjunctiva pink RESPIRATORY: Respiratory effort normal, BS equal bilateral without rales, rhonchi, wheezing CARDIO: RRR with no MRGs, peripheral pulses intact ABDOMEN: Soft, non  distended, active bowel sounds in all 4 quadrants, no tenderness to palpation, no rebound, no mass appreciated RECTAL: declines MUSCULOSKELETAL: Full ROM, normal gait, without edema SKIN: Dry, intact without rashes or lesions. No jaundice. NEURO: Alert, oriented, no focal deficits PSYCH: Cooperative, normal mood and affect.      Edmonia Gottron, PA-C 11:02 AM

## 2024-03-07 ENCOUNTER — Encounter: Payer: Self-pay | Admitting: Physician Assistant

## 2024-03-07 ENCOUNTER — Ambulatory Visit (INDEPENDENT_AMBULATORY_CARE_PROVIDER_SITE_OTHER): Admitting: Physician Assistant

## 2024-03-07 VITALS — BP 102/74 | Ht 61.0 in | Wt 172.5 lb

## 2024-03-07 DIAGNOSIS — R112 Nausea with vomiting, unspecified: Secondary | ICD-10-CM

## 2024-03-07 DIAGNOSIS — K219 Gastro-esophageal reflux disease without esophagitis: Secondary | ICD-10-CM

## 2024-03-07 DIAGNOSIS — R1319 Other dysphagia: Secondary | ICD-10-CM

## 2024-03-07 DIAGNOSIS — G8929 Other chronic pain: Secondary | ICD-10-CM | POA: Diagnosis not present

## 2024-03-07 DIAGNOSIS — R110 Nausea: Secondary | ICD-10-CM

## 2024-03-07 DIAGNOSIS — R1310 Dysphagia, unspecified: Secondary | ICD-10-CM

## 2024-03-07 DIAGNOSIS — K449 Diaphragmatic hernia without obstruction or gangrene: Secondary | ICD-10-CM | POA: Diagnosis not present

## 2024-03-07 DIAGNOSIS — R109 Unspecified abdominal pain: Secondary | ICD-10-CM

## 2024-03-07 DIAGNOSIS — R1013 Epigastric pain: Secondary | ICD-10-CM

## 2024-03-07 MED ORDER — PROMETHAZINE HCL 25 MG PO TABS: 25.0000 mg | ORAL_TABLET | Freq: Four times a day (QID) | ORAL | 0 refills | Status: DC | PRN

## 2024-03-07 MED ORDER — AMITRIPTYLINE HCL 10 MG PO TABS
10.0000 mg | ORAL_TABLET | Freq: Every day | ORAL | 1 refills | Status: DC
Start: 2024-03-07 — End: 2024-04-25

## 2024-03-07 MED ORDER — HYOSCYAMINE SULFATE 0.125 MG SL SUBL: 0.1250 mg | SUBLINGUAL_TABLET | Freq: Four times a day (QID) | SUBLINGUAL | 1 refills | Status: DC | PRN

## 2024-03-07 MED ORDER — METOCLOPRAMIDE HCL 10 MG PO TABS: 10.0000 mg | ORAL_TABLET | Freq: Four times a day (QID) | ORAL | 0 refills | Status: DC

## 2024-03-07 NOTE — Progress Notes (Signed)
 Office Note     CC: Celiac artery dissection Requesting Provider:  Cleotilde Perkins, DO  HPI: Tamara Church is a 48 y.o. (1976/08/17) female presenting at the request of .Cleotilde Perkins, DO celiac artery dissection.  Patient was seen in the ED with acute abdominal pain last month.  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  There was some dilation that was reviewed, and found to be similar to imaging from 2021 at 1.5 cm.  Follow-up was scheduled in our office.   Of note the patient has an extensive GI history with esophageal dysmotility, GERD, chronic abdominal pain, bilious vomiting with nausea.  Currently followed by Dr. Wilhelmenia.  On exam, Tamara Church was doing well, accompanied by her husband.  Tamara Church notes waxing and waning nausea, most prevalent when she eats fried foods, or does not chew up foods enough.  She states the nausea usually occurs when she feels like the food gets trapped in her esophagus.  This occurs intermittently.  She has had no issues over the last several weeks.  In an effort to quell symptoms, she eats at home, staying away from fried foods.  She has had some weight loss, but thinks it is due to to eating healthier.  At her last ED visit, she was given a medication which she thinks helped to push the food down into her stomach, relieving her nausea symptoms.  Denies food fear, postprandial pain.  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   Gastritis and gastroduodenitis 07/27/2020    Past Surgical History:  Procedure Laterality Date   69 HOUR PH STUDY N/A 08/25/2023   Procedure: 24 HOUR PH STUDY;  Surgeon: Wilhelmenia Aloha Raddle., MD;  Location: [facility];  Service: Gastroenterology;  Laterality: N/A;   BIOPSY  05/26/2023   Procedure: BIOPSY;  Surgeon: Wilhelmenia Aloha Raddle., MD;  Location: [facility];  Service:  Gastroenterology;;   CHOLECYSTECTOMY  09/2022   COLONOSCOPY  05/2023   ESOPHAGEAL MANOMETRY N/A 08/25/2023   Procedure: ESOPHAGEAL MANOMETRY (EM);  Surgeon: Wilhelmenia Aloha Raddle., MD;  Location: [facility];  Service: Gastroenterology;  Laterality: N/A;   ESOPHAGOGASTRODUODENOSCOPY (EGD) WITH PROPOFOL  N/A 05/26/2023   Procedure: ESOPHAGOGASTRODUODENOSCOPY (EGD) WITH PROPOFOL ;  Surgeon: Wilhelmenia Aloha Raddle., MD;  Location: [facility];  Service: Gastroenterology;  Laterality: N/A;   NO PAST SURGERIES     UPPER GASTROINTESTINAL ENDOSCOPY      Social History   Socioeconomic History   Marital status: Married    Spouse name: Not on file   Number of children: 6   Years of education: Not on file   Highest education level: Not on file  Occupational History   Not on file  Tobacco Use   Smoking status: Never   Smokeless tobacco: Never  Vaping Use   Vaping status: Never Used  Substance and Sexual Activity   Alcohol use: No   Drug use: No   Sexual activity: Yes    Birth control/protection: I.U.D.    Comment: pt would like to start depo provera  today  Other Topics Concern   Not on file  Social History Narrative   Not on file   Social Drivers of Health   Financial Resource Strain: High Risk (02/29/2024)   Overall Financial Resource Strain (CARDIA)    Difficulty of Paying Living Expenses: Hard  Food Insecurity: No Food Insecurity (02/29/2024)   Hunger Vital Sign  Worried About Programme researcher, broadcasting/film/video in the Last Year: Never true    Ran Out of Food in the Last Year: Never true  Transportation Needs: No Transportation Needs (02/17/2024)   PRAPARE - Administrator, Civil Service (Medical): No    Lack of Transportation (Non-Medical): No  Physical Activity: Not on file  Stress: Not on file  Social Connections: Not on file  Intimate Partner Violence: Not At Risk (02/17/2024)   Humiliation, Afraid, Rape, and Kick questionnaire    Fear of Current or Ex-Partner: No     Emotionally Abused: No    Physically Abused: No    Sexually Abused: No   Family History  Problem Relation Age of Onset   Hypertension Mother    Colon cancer Neg Hx    Esophageal cancer Neg Hx    Rectal cancer Neg Hx    Stomach cancer Neg Hx    Inflammatory bowel disease Neg Hx    Liver disease Neg Hx    Pancreatic cancer Neg Hx     Current Outpatient Medications  Medication Sig Dispense Refill   aluminum -magnesium  hydroxide 200-200 MG/5ML suspension Take 15 mLs by mouth every 6 (six) hours as needed for indigestion. (Patient not taking: Reported on 02/17/2024) 355 mL 0   amitriptyline  (ELAVIL ) 50 MG tablet Take 1 tablet (50 mg total) by mouth at bedtime. (Patient not taking: Reported on 02/17/2024) 30 tablet 6   amLODipine  (NORVASC ) 10 MG tablet Take 1 tablet by mouth once daily 90 tablet 0   amoxicillin -clavulanate (AUGMENTIN ) 875-125 MG tablet Take 1 tablet by mouth every 12 (twelve) hours. (Patient not taking: Reported on 02/17/2024) 14 tablet 0   dexlansoprazole  (DEXILANT ) 60 MG capsule Take 1 capsule (60 mg total) by mouth daily. 90 capsule 3   famotidine  (PEPCID ) 20 MG tablet Take 1 tablet by mouth twice daily (Patient not taking: Reported on 02/17/2024) 60 tablet 1   folic acid  (FOLVITE ) 1 MG tablet Take 1 tablet (1 mg total) by mouth daily. 30 tablet 3   hydrOXYzine  (ATARAX ) 10 MG tablet Take 1 tablet (10 mg total) by mouth 2 (two) times daily. 30 tablet 0   loperamide  (IMODIUM ) 2 MG capsule Take 1 capsule (2 mg total) by mouth 4 (four) times daily as needed for diarrhea or loose stools. 12 capsule 0   metoCLOPramide  (REGLAN ) 10 MG tablet Take 1 tablet (10 mg total) by mouth every 6 (six) hours. 30 tablet 0   ondansetron  (ZOFRAN -ODT) 4 MG disintegrating tablet Take 1 tablet (4 mg total) by mouth every 8 (eight) hours as needed for nausea or vomiting. 20 tablet 0   oxyCODONE -acetaminophen  (PERCOCET) 5-325 MG tablet Take 1 tablet by mouth every 6 (six) hours as needed for up to 15  doses. 15 tablet 0   promethazine  (PHENERGAN ) 25 MG suppository Place 1 suppository (25 mg total) rectally every 6 (six) hours as needed for nausea or vomiting. 12 each 0   promethazine  (PHENERGAN ) 25 MG suppository Place 1 suppository (25 mg total) rectally every 6 (six) hours as needed for nausea or vomiting. 12 each 0   promethazine  (PHENERGAN ) 25 MG tablet Take 1 tablet (25 mg total) by mouth every 6 (six) hours as needed for nausea or vomiting. 30 tablet 0   spironolactone  (ALDACTONE ) 25 MG tablet Take 2 tablets (50 mg total) by mouth at bedtime. 180 tablet 3   Vonoprazan Fumarate  (VOQUEZNA ) 20 MG TABS Take 20 mg by mouth daily. (Patient not taking: Reported on 02/17/2024)  30 tablet 3   No current facility-administered medications for this visit.    Allergies  Allergen Reactions   Hctz [Hydrochlorothiazide ] Other (See Comments)    hypokalemia   Ace Inhibitors Cough   Angiotensin Receptor Blockers Other (See Comments)    cough     REVIEW OF SYSTEMS:  [X]  denotes positive finding, [ ]  denotes negative finding Cardiac  Comments:  Chest pain or chest pressure:    Shortness of breath upon exertion:    Short of breath when lying flat:    Irregular heart rhythm:        Vascular    Pain in calf, thigh, or hip brought on by ambulation:    Pain in feet at night that wakes you up from your sleep:     Blood clot in your veins:    Leg swelling:         Pulmonary    Oxygen at home:    Productive cough:     Wheezing:         Neurologic    Sudden weakness in arms or legs:     Sudden numbness in arms or legs:     Sudden onset of difficulty speaking or slurred speech:    Temporary loss of vision in one eye:     Problems with dizziness:         Gastrointestinal    Blood in stool:     Vomited blood:         Genitourinary    Burning when urinating:     Blood in urine:        Psychiatric    Major depression:         Hematologic    Bleeding problems:    Problems with blood  clotting too easily:        Skin    Rashes or ulcers:        Constitutional    Fever or chills:      PHYSICAL EXAMINATION:  There were no vitals filed for this visit.  General:  WDWN in NAD; vital signs documented above Gait: Not observed HENT: WNL, normocephalic Pulmonary: normal non-labored breathing , without wheezing Cardiac: regular HR Abdomen: soft, NT, no masses Skin: without rashes Vascular Exam/Pulses:  Right Left  Radial 2+ (normal) 2+ (normal)                       Extremities: without ischemic changes, without Gangrene , without cellulitis; without open wounds;  Musculoskeletal: no muscle wasting or atrophy  Neurologic: A&O X 3;  No focal weakness or paresthesias are detected Psychiatric:  The pt has Normal affect.   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  No dissection appreciated.    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  CT scan was reviewed, there was some concern for celiac artery dissection, however this was not appreciated and most recent ultrasound.  No concern for median arcuate ligament syndrome.  Normal velocities.  No aneurysm. Imaging was reviewed from 2021, there have been no changes in the size of the celiac artery.  Being that she does have some dilation of the celiac artery, we discussed this is something we should follow yearly.  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 I plan to see her in 1 years time with repeat mesenteric ultrasound.  Tamara Church FORBES Rim, MD [facility] [phone] Total time of patient care including pre-visit research, consultation, and documentation greater than 60 minutes

## 2024-03-07 NOTE — Patient Instructions (Addendum)
 _______________________________________________________  If your blood pressure at your visit was 140/90 or greater, please contact your primary care physician to follow up on this.  _______________________________________________________  If you are age 47 or older, your body mass index should be between 23-30. Your Body mass index is 32.59 kg/m. If this is out of the aforementioned range listed, please consider follow up with your Primary Care Provider.  If you are age 54 or younger, your body mass index should be between 19-25. Your Body mass index is 32.59 kg/m. If this is out of the aformentioned range listed, please consider follow up with your Primary Care Provider.   ________________________________________________________  The [place] [facility] providers would like to encourage you to use MYCHART to communicate with providers for non-urgent requests or questions.  Due to long hold times on the telephone, sending your provider a message by [facility] may be a faster and more efficient way to get a response.  Please allow 48 business hours for a response.  Please remember that this is for non-urgent requests.  _______________________________________________________  Please follow up in 5 months. Give us  a call at [phone] to schedule an appointment.  Follow up with [facility]   Can send in an anti spasm medication, Levsin, to take as needed if you have something stuck in your esophagus or the discomfort Can also try an altoid melt in your mouth  Elavil  (generic name: amitriptyline ) 10 mg:  Take  one pill daily at bedtime and see how you do. Common side effects reported are: mouth dryness, drowsiness, confusion, dizziness.   Behavioral and Dietary Strategies for Management of Esophageal Dysmotility/dysphagia 1. Take reflux medications 30+ minutes before food in the morning.  2. Begin meals with warm beverage 3. Eat smaller more frequent meals 4. Eat slowly, taking small bites and sips 5.  Alternate solids and liquids 6. Avoid foods/liquids that increase acid production 7. Sit upright during and for 30+ minutes after meals to facilitate esophageal clearing 8. Can try altoid melting in mouth before food  You can add on protein and things high calorie for weight gain - Can add ensure/boost to ice cream for a shake - can add protein powder to oatmeal after cooked or to a fruit smooth - avocado has high calorie, good to add to proteins - nuts and peanut butter are good to eat or add to yogurt/smoothies - [place] yogurt is good   Reglan  or metoclopramide   Can be used for gastroparesis or slow stomach, nausea, vomiting, GERD.   Continue the medication as needed up to 2 times a day, on an empty stomach 30 minutes before eating. It may take a few weeks for your stomach condition to start to get better. However, do not take this medicine for longer than 12 weeks.  The longer you take this medicine, and the more you take it, the greater your chances are of developing serious side effects.  Some people may get a severe muscle problem called tardive dyskinesia. This problem may lessen or go away after stopping this drug, but it may not go away. The risk is greater with diabetes and in older adults, especially older females. The risk is greater with longer use or higher doses, but it may also occur after short-term use with low doses. Call your doctor right away if you have trouble controlling body movements or problems with your tongue, face, mouth, or jaw like tongue sticking out, puffing cheeks, mouth puckering, or chewing.   Please monitor for worsening depression or  thoughts of suicide, any aggressiveness or hyperactivity.  If this happen please stop and call your physician right away.

## 2024-03-08 ENCOUNTER — Other Ambulatory Visit: Payer: Self-pay

## 2024-03-08 NOTE — Patient Outreach (Signed)
 Complex Care Management   Visit Note  03/08/2024  Name:  Tamara Church MRN: [id number] DOB: 1976/02/06  Situation: Referral received for Complex Care Management related to GERD/dysphagia I obtained verbal consent from Patient.  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   Gastritis and gastroduodenitis 07/27/2020    Assessment: Patient Reported Symptoms:  Cognitive Cognitive Status: Able to follow simple commands, Alert and oriented to person, place, and time, Normal speech and language skills Cognitive/Intellectual Conditions Management [RPT]: None reported or documented in medical history or problem list      Neurological Neurological Review of Symptoms: Not assessed    HEENT HEENT Symptoms Reported: Not assessed      Cardiovascular Cardiovascular Symptoms Reported: Not assessed    Respiratory Respiratory Symptoms Reported: Not assesed    Endocrine Patient reports the following symptoms related to hypoglycemia or hyperglycemia : Not assessed    Gastrointestinal Gastrointestinal Symptoms Reported: Other Other Gastrointestinal Symptoms: Continued issues with esophageal mobility, no worse per patient Additional Gastrointestinal Details: Patient reports that diarrhea reported at previous visit has improved. Last BM yesterday, normal per patient. Gastrointestinal Conditions: Abdominal pain, Nausea, Other Other Gastrointestinal Conditions: Hiatal hernia, celiac aneurysm Gastrointestinal Management Strategies: Diet modification, Medication therapy Gastrointestinal Comment: Patient was recently seen with [facility] who ordered new medications. Patient has picked medications up and started taking as ordered. Patient is scheduled for u/s tomorrow. Esophageal center at [facility] 04/07/24. Nutrition Risk  Screen (CP): Difficulty chewing/swallowing, Unintentional loss of 10 lbs or more in the past 2 months  Genitourinary Genitourinary Symptoms Reported: Not assessed    Integumentary Integumentary Symptoms Reported: Not assessed    Musculoskeletal Musculoskelatal Symptoms Reviewed: Not assessed        Psychosocial Psychosocial Symptoms Reported: Not assessed            02/17/2024    2:31 PM  Depression screen PHQ 2/9  Decreased Interest 0  Down, Depressed, Hopeless 0  PHQ - 2 Score 0    There were no vitals filed for this visit.  Medications Reviewed Today     Reviewed by Valaria Garland, RN (Registered Nurse) on 03/08/24 at 1435  Med List Status: <None>   Medication Order Taking? Sig Documenting Provider Last Dose Status Informant  amitriptyline  (ELAVIL ) 10 MG tablet [id number] Yes Take 1 tablet (10 mg total) by mouth at bedtime. Edmonia Gottron, PA-C  Active   amLODipine  (NORVASC ) 10 MG tablet [id number]  Take 1 tablet by mouth once daily Clem Currier, DO  Active   folic acid  (FOLVITE ) 1 MG tablet [id number]  Take 1 tablet (1 mg total) by mouth daily. Mansouraty, Albino Alu., MD  Active   hydrOXYzine  (ATARAX ) 10 MG tablet [id number]  Take 1 tablet (10 mg total) by mouth 2 (two) times daily. Mansouraty, Albino Alu., MD  Active            Med Note (Myron Lona P   Thu Feb 17, 2024  2:12 PM) Patient taking as needed  hyoscyamine (LEVSIN SL) 0.125 MG SL tablet [id number] Yes Place 1 tablet (0.125 mg total) under the tongue every 6 (six) hours as needed for cramping (spasm of esophagus). Edmonia Gottron, PA-C  Active   metoCLOPramide  (REGLAN ) 10 MG tablet [id number]  Take 1 tablet (10 mg total) by mouth every 6 (six)  hours. Edmonia Gottron, PA-C  Active   promethazine  (PHENERGAN ) 25 MG tablet [id number]  Take 1 tablet (25 mg total) by mouth every 6 (six) hours as needed for nausea or vomiting. Edmonia Gottron, PA-C  Active   spironolactone  (ALDACTONE ) 25 MG tablet  [id number]  Take 2 tablets (50 mg total) by mouth at bedtime. Clem Currier, DO  Active   Vonoprazan Fumarate  (VOQUEZNA ) 20 MG TABS [id number] Yes Take 20 mg by mouth daily. Sueellen Emery, MD  Active            Med Note (Randilyn Foisy, Duaine German Feb 17, 2024  2:13 PM) Patient reports they are waiting for medication to be approved            Recommendation:   Continue Current Plan of Care  Follow Up Plan:   Telephone follow up appointment date/time:  04/07/24 at 1 PM  Theodora Fish, RN MSN [place]  VBCI Population Health RN Care Manager Direct Dial: [phone]  Fax: [phone]

## 2024-03-08 NOTE — Patient Instructions (Signed)
 Visit Information  [name] was given information about Medicaid Managed Care team care coordination services as a part of their [facility] Community Plan Medicaid benefit. Tamara Church verbally consented to engagement with the [facility] Managed Care team.   If you are experiencing a medical emergency, please call 911 or report to your local emergency department or urgent care.   If you have a non-emergency medical problem during routine business hours, please contact your provider's office and ask to speak with a nurse.   For questions related to your [facility], please call: [phone] or visit the homepage here: [email or web address]  If you would like to schedule transportation through your [facility], please call the following number at least 2 days in advance of your appointment: [phone]   Rides for urgent appointments can also be made after hours by calling Member Services.  Call the Behavioral Health Crisis Line at [phone], at any time, 24 hours a day, 7 days a week. If you are in danger or need immediate medical attention call 911.  If you would like help to quit smoking, call 1-800-QUIT-NOW (908-127-6905) OR Espaol: 1-855-Djelo-Ya (7-253-664-4034) o para ms informacin haga clic aqu or Text READY to 742-595 to register via text  [name] - following are the goals we discussed in your visit today:   Goals Addressed             This Visit's Progress    VBCI RN Care Plan related to GERD/dysphagia   On track    Problems:  Chronic Disease Management support and education needs related to GERD and dysphagia  Goal: Over the next 30 days the Patient will attend all scheduled medical appointments: GI, vascular surgery, and cardiology as evidenced by completed visit notes uploaded to EMR        demonstrate a decrease GERD in exacerbations as evidenced  by decreased visits to the emergency room (10 in the past 6 months) take all medications exactly as prescribed and will call provider for medication related questions as evidenced by patient report of medication compliance    work with community resource care guide to address needs related to Housing barriers and utility barriers as evidenced by patient and/or community resource care guide support     Interventions:   Evaluation of current treatment plan related to GERD and dysphagia self-management and patient's adherence to plan as established by provider. Discussed plans with patient for ongoing care management follow up and provided patient with direct contact information for care management team Evaluation of current treatment plan related to GERD/dysphagia and patient's adherence to plan as established by provider Reviewed medications with patient and discussed medication compliance as advised by GI  Patient Self-Care Activities:  Attend all scheduled provider appointments Call provider office for new concerns or questions  Take medications as prescribed    Plan:  Telephone follow up appointment with care management team member scheduled for:  04/07/24 at 1 PM             The patient verbalized understanding of instructions, educational materials, and care plan provided today and DECLINED offer to receive copy of patient instructions, educational materials, and care plan.   Telephone follow up appointment with Managed Medicaid care management team member scheduled for: 04/07/24 at 1 PM  Theodora Fish, RN MSN [place]  [facility] RN Care Manager Direct Dial: [phone]  Fax: [phone]   Following is a copy of your plan  of care:  There are no care plans that you recently modified to display for this patient.

## 2024-03-09 ENCOUNTER — Ambulatory Visit (HOSPITAL_COMMUNITY)
Admission: RE | Admit: 2024-03-09 | Discharge: 2024-03-09 | Disposition: A | Discharge status: Home / Self Care | Source: Ambulatory Visit | Attending: Vascular Surgery | Admitting: Vascular Surgery

## 2024-03-09 ENCOUNTER — Encounter: Payer: Self-pay | Admitting: Vascular Surgery

## 2024-03-09 ENCOUNTER — Ambulatory Visit (INDEPENDENT_AMBULATORY_CARE_PROVIDER_SITE_OTHER): Admitting: Vascular Surgery

## 2024-03-09 VITALS — BP 125/88 | HR 79 | Temp 97.7°F | Resp 18 | Ht 61.0 in | Wt 174.0 lb

## 2024-03-09 DIAGNOSIS — K551 Chronic vascular disorders of intestine: Secondary | ICD-10-CM | POA: Diagnosis present

## 2024-03-09 DIAGNOSIS — I728 Aneurysm of other specified arteries: Secondary | ICD-10-CM | POA: Diagnosis not present

## 2024-03-11 NOTE — Progress Notes (Signed)
 Attending Physician's Attestation   I have reviewed the chart.   I agree with the Advanced Practitioner's note, impression, and recommendations with any updates as below. Quite complex situation for this individual/patient.  Agree with aggressive workup as outlined.  Awaiting vascular surgery input as well has continued discussion with our surgical colleagues.   Aloha Finner, MD [facility]oscopy Office # [phone]

## 2024-03-14 ENCOUNTER — Other Ambulatory Visit: Payer: Self-pay

## 2024-03-14 ENCOUNTER — Encounter (HOSPITAL_BASED_OUTPATIENT_CLINIC_OR_DEPARTMENT_OTHER): Payer: Self-pay | Admitting: Emergency Medicine

## 2024-03-14 ENCOUNTER — Emergency Department (HOSPITAL_BASED_OUTPATIENT_CLINIC_OR_DEPARTMENT_OTHER): Admitting: Radiology

## 2024-03-14 ENCOUNTER — Emergency Department (HOSPITAL_BASED_OUTPATIENT_CLINIC_OR_DEPARTMENT_OTHER)
Admission: EM | Admit: 2024-03-14 | Discharge: 2024-03-15 | Disposition: A | Discharge status: Home / Self Care | Attending: Emergency Medicine | Admitting: Emergency Medicine

## 2024-03-14 DIAGNOSIS — I10 Essential (primary) hypertension: Secondary | ICD-10-CM | POA: Diagnosis not present

## 2024-03-14 DIAGNOSIS — R09A2 Foreign body sensation, throat: Secondary | ICD-10-CM | POA: Diagnosis present

## 2024-03-14 DIAGNOSIS — R0989 Other specified symptoms and signs involving the circulatory and respiratory systems: Secondary | ICD-10-CM | POA: Diagnosis not present

## 2024-03-14 DIAGNOSIS — R000 Tachycardia, unspecified: Secondary | ICD-10-CM | POA: Insufficient documentation

## 2024-03-14 DIAGNOSIS — R112 Nausea with vomiting, unspecified: Secondary | ICD-10-CM | POA: Diagnosis not present

## 2024-03-14 DIAGNOSIS — R110 Nausea: Secondary | ICD-10-CM | POA: Diagnosis not present

## 2024-03-14 DIAGNOSIS — Z79899 Other long term (current) drug therapy: Secondary | ICD-10-CM | POA: Diagnosis not present

## 2024-03-14 DIAGNOSIS — I878 Other specified disorders of veins: Secondary | ICD-10-CM | POA: Diagnosis not present

## 2024-03-14 DIAGNOSIS — F458 Other somatoform disorders: Secondary | ICD-10-CM | POA: Diagnosis not present

## 2024-03-14 LAB — PREGNANCY, URINE: Preg Test, Ur: NEGATIVE

## 2024-03-14 LAB — CBC WITH DIFFERENTIAL/PLATELET
Abs Immature Granulocytes: 0.02 10*3/uL (ref 0.00–0.07)
Basophils Absolute: 0 10*3/uL (ref 0.0–0.1)
Basophils Relative: 1 %
Eosinophils Absolute: 0 10*3/uL (ref 0.0–0.5)
Eosinophils Relative: 0 %
HCT: 36.7 % (ref 36.0–46.0)
Hemoglobin: 12.2 g/dL (ref 12.0–15.0)
Immature Granulocytes: 0 %
Lymphocytes Relative: 13 %
Lymphs Abs: 1.2 10*3/uL (ref 0.7–4.0)
MCH: 29.7 pg (ref 26.0–34.0)
MCHC: 33.2 g/dL (ref 30.0–36.0)
MCV: 89.3 fL (ref 80.0–100.0)
Monocytes Absolute: 0.3 10*3/uL (ref 0.1–1.0)
Monocytes Relative: 3 %
Neutro Abs: 7.2 10*3/uL (ref 1.7–7.7)
Neutrophils Relative %: 83 %
Platelets: 179 10*3/uL (ref 150–400)
RBC: 4.11 MIL/uL (ref 3.87–5.11)
RDW: 13.8 % (ref 11.5–15.5)
WBC: 8.7 10*3/uL (ref 4.0–10.5)
nRBC: 0 % (ref 0.0–0.2)

## 2024-03-14 LAB — URINALYSIS, ROUTINE W REFLEX MICROSCOPIC
Bilirubin Urine: NEGATIVE
Glucose, UA: NEGATIVE mg/dL
Hgb urine dipstick: NEGATIVE
Ketones, ur: NEGATIVE mg/dL
Leukocytes,Ua: NEGATIVE
Nitrite: NEGATIVE
Protein, ur: NEGATIVE mg/dL
Specific Gravity, Urine: <1.005 — ABNORMAL LOW (ref 1.005–1.030)
pH: 7 (ref 5.0–8.0)

## 2024-03-14 MED ORDER — ONDANSETRON HCL 4 MG/2ML IJ SOLN
4.0000 mg | Freq: Once | INTRAMUSCULAR | Status: AC
  Administered 2024-03-14: 4 mg via INTRAVENOUS
  Filled 2024-03-14: qty 2

## 2024-03-14 MED ORDER — ALUM & MAG HYDROXIDE-SIMETH 200-200-20 MG/5ML PO SUSP
30.0000 mL | Freq: Once | ORAL | Status: AC
  Administered 2024-03-14: 30 mL via ORAL
  Filled 2024-03-14: qty 30

## 2024-03-14 MED ORDER — LIDOCAINE VISCOUS HCL 2 % MT SOLN
15.0000 mL | Freq: Once | OROMUCOSAL | Status: AC
  Administered 2024-03-14: 15 mL via OROMUCOSAL
  Filled 2024-03-14: qty 15

## 2024-03-14 MED ORDER — GLUCAGON HCL RDNA (DIAGNOSTIC) 1 MG IJ SOLR
1.0000 mg | Freq: Once | INTRAMUSCULAR | Status: AC
  Administered 2024-03-14: 1 mg via INTRAVENOUS
  Filled 2024-03-14: qty 1

## 2024-03-14 NOTE — ED Triage Notes (Signed)
 Nausea and vomiting Fells like something is stuck.  Ate some baked chicken around 6:30pm Has been able to drink and hold it down

## 2024-03-14 NOTE — ED Provider Notes (Signed)
 [place] EMERGENCY DEPARTMENT AT [facility] Provider Note   CSN: [id number] Arrival date & time: 03/14/24  2100     Patient presents with: Nausea and globus   Tamara Church is a 48 y.o. female.  {Add pertinent medical, surgical, social history, OB history to HPI:32947} Hx of hypertension, anemia, anxiety cholelithiasis status post CCK, hiatal hernia, GERD, esophageal dysmotility here with foreign body sensation in her esophagus.  States she felt well prior to eating some baked chicken around 6:30 PM.  She feels like something is stuck in her esophagus and she is having nausea but no vomiting.  Controlling secretions.  Similar symptoms in the past most recently about a month ago.  Denies significant abdominal pain.  No chest pain.  No shortness of breath.  Some difficulty swallowing but no difficulty breathing.  No fever.  No history of esophageal food impaction previously.  Has had similar in the past symptoms in the past with any specific foods.   The history is provided by the patient.       Prior to Admission medications   Medication Sig Start Date End Date Taking? Authorizing Provider  amitriptyline  (ELAVIL ) 10 MG tablet Take 1 tablet (10 mg total) by mouth at bedtime. 03/07/24   Craig Alan SAUNDERS, PA-C  amLODipine  (NORVASC ) 10 MG tablet Take 1 tablet by mouth once daily 01/31/24   Cleotilde Perkins, DO  folic acid  (FOLVITE ) 1 MG tablet Take 1 tablet (1 mg total) by mouth daily. 10/13/23   Mansouraty, Aloha Raddle., MD  hydrOXYzine  (ATARAX ) 10 MG tablet Take 1 tablet (10 mg total) by mouth 2 (two) times daily. 07/21/23   Mansouraty, Gabriel Jr., MD  hyoscyamine (LEVSIN SL) 0.125 MG SL tablet Place 1 tablet (0.125 mg total) under the tongue every 6 (six) hours as needed for cramping (spasm of esophagus). 03/07/24   Craig Alan SAUNDERS, PA-C  metoCLOPramide  (REGLAN ) 10 MG tablet Take 1 tablet (10 mg total) by mouth every 6 (six) hours. 03/07/24   Craig Alan SAUNDERS, PA-C   promethazine  (PHENERGAN ) 25 MG tablet Take 1 tablet (25 mg total) by mouth every 6 (six) hours as needed for nausea or vomiting. 03/07/24   Craig Alan SAUNDERS, PA-C  spironolactone  (ALDACTONE ) 25 MG tablet Take 2 tablets (50 mg total) by mouth at bedtime. 09/30/23   Cleotilde Perkins, DO  Vonoprazan Fumarate  (VOQUEZNA ) 20 MG TABS Take 20 mg by mouth daily. 02/11/24   Haviland, Julie, MD    Allergies: Hctz [hydrochlorothiazide ], Ace inhibitors, and Angiotensin receptor blockers    Review of Systems  Constitutional:  Negative for activity change, appetite change, fatigue and fever.  HENT:  Negative for congestion and rhinorrhea.   Respiratory:  Negative for chest tightness.   Cardiovascular:  Negative for chest pain.  Gastrointestinal:  Positive for nausea. Negative for abdominal pain and vomiting.  Genitourinary:  Negative for dysuria and hematuria.  Musculoskeletal:  Negative for arthralgias and myalgias.  Skin:  Negative for rash.  Neurological:  Negative for dizziness, weakness and headaches.   all other systems are negative except as noted in the HPI and PMH.    Updated Vital Signs BP (!) 134/96 (BP Location: Right Arm)   Pulse (!) 110   Temp 98.9 F (37.2 C)   Resp 16   LMP 02/10/2024 (Approximate)   SpO2 97%   Physical Exam Vitals and nursing note reviewed.  Constitutional:      General: She is not in acute distress.    Appearance: She is  well-developed.  HENT:     Head: Normocephalic and atraumatic.     Mouth/Throat:     Pharynx: No oropharyngeal exudate.     Comments: Controlling secretions, no distress  Eyes:     Conjunctiva/sclera: Conjunctivae normal.     Pupils: Pupils are equal, round, and reactive to light.   Neck:     Comments: No meningismus. Cardiovascular:     Rate and Rhythm: Regular rhythm. Tachycardia present.     Heart sounds: Normal heart sounds. No murmur heard. Pulmonary:     Effort: Pulmonary effort is normal. No respiratory distress.     Breath  sounds: Normal breath sounds.  Abdominal:     Palpations: Abdomen is soft.     Tenderness: There is no abdominal tenderness. There is no guarding or rebound.   Musculoskeletal:        General: No tenderness. Normal range of motion.     Cervical back: Normal range of motion and neck supple.   Skin:    General: Skin is warm.   Neurological:     Mental Status: She is alert and oriented to person, place, and time.     Cranial Nerves: No cranial nerve deficit.     Motor: No abnormal muscle tone.     Coordination: Coordination normal.     Comments:  5/5 strength throughout. CN 2-12 intact.Equal grip strength.   Psychiatric:        Behavior: Behavior normal.     (all labs ordered are listed, but only abnormal results are displayed) Labs Reviewed  URINALYSIS, ROUTINE W REFLEX MICROSCOPIC - Abnormal; Notable for the following components:      Result Value   Color, Urine COLORLESS (*)    Specific Gravity, Urine <1.005 (*)    All other components within normal limits  CBC WITH DIFFERENTIAL/PLATELET  PREGNANCY, URINE  COMPREHENSIVE METABOLIC PANEL WITH GFR  LIPASE, BLOOD  TROPONIN T, HIGH SENSITIVITY    EKG: EKG Interpretation Date/Time:  Tuesday March 14 2024 23:09:22 EDT Ventricular Rate:  104 PR Interval:  159 QRS Duration:  93 QT Interval:  359 QTC Calculation: 473 R Axis:   40  Text Interpretation: Sinus tachycardia No significant change was found Confirmed by Carita Senior [phone]) on 03/14/2024 11:11:56 PM  Radiology: No results found.  {Document cardiac monitor, telemetry assessment procedure when appropriate:32947} Procedures   Medications Ordered in the ED  alum & mag hydroxide-simeth (MAALOX/MYLANTA) 200-200-20 MG/5ML suspension 30 mL (has no administration in time range)  lidocaine  (XYLOCAINE ) 2 % viscous mouth solution 15 mL (has no administration in time range)  ondansetron  (ZOFRAN ) injection 4 mg (has no administration in time range)  glucagon (human  recombinant) (GLUCAGEN) injection 1 mg (has no administration in time range)      {Click here for ABCD2, HEART and other calculators REFRESH Note before signing:1}                              Medical Decision Making Amount and/or Complexity of Data Reviewed Labs: ordered. Decision-making details documented in ED Course. Radiology: ordered and independent interpretation performed. Decision-making details documented in ED Course. ECG/medicine tests: ordered and independent interpretation performed. Decision-making details documented in ED Course.  Risk OTC drugs. Prescription drug management.   Esophageal foreign body sensation with nausea, similar symptoms in the past.  Airway intact.  Able to swallow secretions.  Abdomen soft without peritoneal signs.  Will give IV fluids, glucagon, [name].  Labs obtained in triage are reassuring.  No leukocytosis.  LFTs and lipase are normal.  {Document critical care time when appropriate  Document review of labs and clinical decision tools ie CHADS2VASC2, etc  Document your independent review of radiology images and any outside records  Document your discussion with family members, caretakers and with consultants  Document social determinants of health affecting pt's care  Document your decision making why or why not admission, treatments were needed:32947:::1}   Final diagnoses:  None    ED Discharge Orders     None

## 2024-03-15 ENCOUNTER — Emergency Department (HOSPITAL_BASED_OUTPATIENT_CLINIC_OR_DEPARTMENT_OTHER)
Admission: RE | Admit: 2024-03-15 | Discharge: 2024-03-15 | Disposition: A | Discharge status: Home / Self Care | Source: Ambulatory Visit | Attending: Emergency Medicine | Admitting: Emergency Medicine

## 2024-03-15 ENCOUNTER — Emergency Department (HOSPITAL_BASED_OUTPATIENT_CLINIC_OR_DEPARTMENT_OTHER)

## 2024-03-15 DIAGNOSIS — J90 Pleural effusion, not elsewhere classified: Secondary | ICD-10-CM | POA: Diagnosis not present

## 2024-03-15 DIAGNOSIS — E041 Nontoxic single thyroid nodule: Secondary | ICD-10-CM | POA: Diagnosis not present

## 2024-03-15 DIAGNOSIS — N8301 Follicular cyst of right ovary: Secondary | ICD-10-CM | POA: Diagnosis not present

## 2024-03-15 DIAGNOSIS — N281 Cyst of kidney, acquired: Secondary | ICD-10-CM | POA: Diagnosis not present

## 2024-03-15 DIAGNOSIS — R7989 Other specified abnormal findings of blood chemistry: Secondary | ICD-10-CM | POA: Diagnosis not present

## 2024-03-15 LAB — COMPREHENSIVE METABOLIC PANEL WITH GFR
ALT: 25 U/L (ref 0–44)
AST: 29 U/L (ref 15–41)
Albumin: 4.3 g/dL (ref 3.5–5.0)
Alkaline Phosphatase: 64 U/L (ref 38–126)
Anion gap: 12 (ref 5–15)
BUN: 10 mg/dL (ref 6–20)
CO2: 24 mmol/L (ref 22–32)
Calcium: 10.1 mg/dL (ref 8.9–10.3)
Chloride: 102 mmol/L (ref 98–111)
Creatinine, Ser: 0.86 mg/dL (ref 0.44–1.00)
GFR, Estimated: >60 mL/min (ref >60)
Glucose, Bld: 127 mg/dL — ABNORMAL HIGH (ref 70–99)
Potassium: 4.5 mmol/L (ref 3.5–5.1)
Sodium: 138 mmol/L (ref 135–145)
Total Bilirubin: 0.2 mg/dL (ref 0.0–1.2)
Total Protein: 8.3 g/dL — ABNORMAL HIGH (ref 6.5–8.1)

## 2024-03-15 LAB — LIPASE, BLOOD: Lipase: 18 U/L (ref 11–51)

## 2024-03-15 LAB — TROPONIN T, HIGH SENSITIVITY
Troponin T High Sensitivity: <15 ng/L (ref <19)
Troponin T High Sensitivity: <15 ng/L (ref <19)

## 2024-03-15 LAB — D-DIMER, QUANTITATIVE: D-Dimer, Quant: <0.27 ug{FEU}/mL (ref 0.00–0.50)

## 2024-03-15 MED ORDER — LACTATED RINGERS IV BOLUS
1000.0000 mL | Freq: Once | INTRAVENOUS | Status: AC
  Administered 2024-03-15: 1000 mL via INTRAVENOUS

## 2024-03-15 MED ORDER — IOHEXOL 350 MG/ML SOLN
100.0000 mL | Freq: Once | INTRAVENOUS | Status: AC | PRN
  Administered 2024-03-15: 100 mL via INTRAVENOUS

## 2024-03-15 MED ORDER — SODIUM CHLORIDE 0.9 % IV SOLN
12.5000 mg | Freq: Once | INTRAVENOUS | Status: AC
  Administered 2024-03-15: 12.5 mg via INTRAVENOUS
  Filled 2024-03-15: qty 0.5

## 2024-03-15 MED ORDER — KETOROLAC TROMETHAMINE 30 MG/ML IJ SOLN
15.0000 mg | Freq: Once | INTRAMUSCULAR | Status: AC
  Administered 2024-03-15: 15 mg via INTRAVENOUS
  Filled 2024-03-15: qty 1

## 2024-03-15 MED ORDER — HYDROMORPHONE HCL 1 MG/ML IJ SOLN
1.0000 mg | Freq: Once | INTRAMUSCULAR | Status: DC
  Filled 2024-03-15: qty 1

## 2024-03-15 MED ORDER — GLUCAGON HCL RDNA (DIAGNOSTIC) 1 MG IJ SOLR
1.0000 mg | Freq: Once | INTRAMUSCULAR | Status: AC
  Administered 2024-03-15: 1 mg via INTRAVENOUS
  Filled 2024-03-15: qty 1

## 2024-03-15 MED ORDER — PROMETHAZINE HCL 25 MG/ML IJ SOLN
INTRAMUSCULAR | Status: AC
  Filled 2024-03-15: qty 1

## 2024-03-15 NOTE — ED Provider Notes (Signed)
 Patient's ultrasound results discussed with her. IMPRESSION: 1. No evidence for ovarian torsion. 2. 3.1 cm anechoic cyst within the right ovary. No follow-up imaging recommended. 3. There is a solid-appearing filling defect within the endometrial canal which measures 1.2 x 0.6 x 0.7 cm. This may represent a small polyp. Consider further evaluation with sonohysterogram.  Follow-up with her PCP as well as gynecologist for further evaluation.   Carita Senior, MD 03/15/24 [id number]

## 2024-03-15 NOTE — Discharge Instructions (Addendum)
 Follow-up with your doctor for a ultrasound of your thyroid  gland for further assessment of your thyroid  nodule.  Take your stomach medications as prescribed.  Start with a clear liquid diet advance slowly as tolerated.  You also have a cyst in your right ovary for which you can have an ultrasound to further assess. Return later today for an ultrasound of your ovaries to ensure there is no appropriate blood supply to your ovary.  Follow-up with your primary doctor and stomach specialist.  Take your medications as prescribed.  Return to the ED with new or worsening symptoms.

## 2024-03-17 ENCOUNTER — Encounter (HOSPITAL_COMMUNITY): Payer: Self-pay

## 2024-03-17 ENCOUNTER — Other Ambulatory Visit: Payer: Self-pay

## 2024-03-17 ENCOUNTER — Emergency Department (HOSPITAL_COMMUNITY): Admission: EM | Admit: 2024-03-17 | Discharge: 2024-03-17 | Disposition: A | Discharge status: Home / Self Care

## 2024-03-17 DIAGNOSIS — R002 Palpitations: Secondary | ICD-10-CM | POA: Diagnosis not present

## 2024-03-17 LAB — CBC
HCT: 39 % (ref 36.0–46.0)
Hemoglobin: 12.5 g/dL (ref 12.0–15.0)
MCH: 29.6 pg (ref 26.0–34.0)
MCHC: 32.1 g/dL (ref 30.0–36.0)
MCV: 92.2 fL (ref 80.0–100.0)
Platelets: 262 10*3/uL (ref 150–400)
RBC: 4.23 MIL/uL (ref 3.87–5.11)
RDW: 13.5 % (ref 11.5–15.5)
WBC: 7 10*3/uL (ref 4.0–10.5)
nRBC: 0 % (ref 0.0–0.2)

## 2024-03-17 LAB — BASIC METABOLIC PANEL WITH GFR
Anion gap: 12 (ref 5–15)
BUN: 18 mg/dL (ref 6–20)
CO2: 25 mmol/L (ref 22–32)
Calcium: 10.1 mg/dL (ref 8.9–10.3)
Chloride: 101 mmol/L (ref 98–111)
Creatinine, Ser: 0.87 mg/dL (ref 0.44–1.00)
GFR, Estimated: >60 mL/min (ref >60)
Glucose, Bld: 103 mg/dL — ABNORMAL HIGH (ref 70–99)
Potassium: 4.1 mmol/L (ref 3.5–5.1)
Sodium: 138 mmol/L (ref 135–145)

## 2024-03-17 LAB — TROPONIN I (HIGH SENSITIVITY): Troponin I (High Sensitivity): 2 ng/L (ref <18)

## 2024-03-17 LAB — HCG, SERUM, QUALITATIVE: Preg, Serum: NEGATIVE

## 2024-03-17 NOTE — ED Provider Notes (Signed)
 [place] EMERGENCY DEPARTMENT AT [facility] Provider Note   CSN: [id number] Arrival date & time: 03/17/24  1859     Patient presents with: Palpitations   Tamara Church is a 48 y.o. female.   49 year old female presents for evaluation of palpitations.  States has been for a few days but she had it again today.  States she was at a different ER the other day and had abnormal thyroid  numbers.  States she has a follow-up with her primary care doctor Monday.  She states currently she feels well.  When this happened she has no shortness of breath or chest pain.  She denies any other symptoms or concerns at this time.   Palpitations Associated symptoms: no back pain, no chest pain, no cough, no shortness of breath and no vomiting        Prior to Admission medications   Medication Sig Start Date End Date Taking? Authorizing Provider  amitriptyline  (ELAVIL ) 10 MG tablet Take 1 tablet (10 mg total) by mouth at bedtime. 03/07/24   Craig Alan SAUNDERS, PA-C  amLODipine  (NORVASC ) 10 MG tablet Take 1 tablet by mouth once daily 01/31/24   Cleotilde Perkins, DO  folic acid  (FOLVITE ) 1 MG tablet Take 1 tablet (1 mg total) by mouth daily. 10/13/23   Mansouraty, Aloha Raddle., MD  hydrOXYzine  (ATARAX ) 10 MG tablet Take 1 tablet (10 mg total) by mouth 2 (two) times daily. 07/21/23   Mansouraty, Aloha Raddle., MD  hyoscyamine  (LEVSIN  SL) 0.125 MG SL tablet Place 1 tablet (0.125 mg total) under the tongue every 6 (six) hours as needed for cramping (spasm of esophagus). 03/07/24   Craig Alan SAUNDERS, PA-C  metoCLOPramide  (REGLAN ) 10 MG tablet Take 1 tablet (10 mg total) by mouth every 6 (six) hours. 03/07/24   Craig Alan SAUNDERS, PA-C  promethazine  (PHENERGAN ) 25 MG tablet Take 1 tablet (25 mg total) by mouth every 6 (six) hours as needed for nausea or vomiting. 03/07/24   Craig Alan SAUNDERS, PA-C  spironolactone  (ALDACTONE ) 25 MG tablet Take 2 tablets (50 mg total) by mouth at bedtime. 09/30/23   Cleotilde Perkins,  DO  Vonoprazan Fumarate  (VOQUEZNA ) 20 MG TABS Take 20 mg by mouth daily. 02/11/24   Haviland, Julie, MD    Allergies: Hctz [hydrochlorothiazide ], Ace inhibitors, and Angiotensin receptor blockers    Review of Systems  Constitutional:  Negative for chills and fever.  HENT:  Negative for ear pain and sore throat.   Eyes:  Negative for pain and visual disturbance.  Respiratory:  Negative for cough and shortness of breath.   Cardiovascular:  Positive for palpitations. Negative for chest pain.  Gastrointestinal:  Negative for abdominal pain and vomiting.  Genitourinary:  Negative for dysuria and hematuria.  Musculoskeletal:  Negative for arthralgias and back pain.  Skin:  Negative for color change and rash.  Neurological:  Negative for seizures and syncope.  All other systems reviewed and are negative.   Updated Vital Signs BP 138/80   Pulse 100   Temp 98.4 F (36.9 C) (Oral)   Resp 16   Ht 5' 1 (1.549 m)   Wt 77.1 kg   LMP 02/10/2024 (Approximate)   SpO2 100%   BMI 32.12 kg/m   Physical Exam Vitals and nursing note reviewed.  Constitutional:      General: She is not in acute distress.    Appearance: She is well-developed.  HENT:     Head: Normocephalic and atraumatic.   Eyes:  Conjunctiva/sclera: Conjunctivae normal.    Cardiovascular:     Rate and Rhythm: Normal rate and regular rhythm.     Pulses: Normal pulses.     Heart sounds: Normal heart sounds. No murmur heard. Pulmonary:     Effort: Pulmonary effort is normal. No respiratory distress.     Breath sounds: Normal breath sounds. No stridor. No wheezing or rhonchi.  Abdominal:     General: Abdomen is flat. There is no distension.     Palpations: Abdomen is soft. There is no mass.     Tenderness: There is no abdominal tenderness.     Hernia: No hernia is present.   Musculoskeletal:        General: No swelling.     Cervical back: Neck supple.   Skin:    General: Skin is warm and dry.     Capillary  Refill: Capillary refill takes less than 2 seconds.   Neurological:     General: No focal deficit present.     Mental Status: She is alert.   Psychiatric:        Mood and Affect: Mood normal.     (all labs ordered are listed, but only abnormal results are displayed) Labs Reviewed  BASIC METABOLIC PANEL WITH GFR - Abnormal; Notable for the following components:      Result Value   Glucose, Bld 103 (*)    All other components within normal limits  CBC  HCG, SERUM, QUALITATIVE  TROPONIN I (HIGH SENSITIVITY)  TROPONIN I (HIGH SENSITIVITY)    EKG: EKG Interpretation Date/Time:  Friday March 17 2024 19:36:29 EDT Ventricular Rate:  102 PR Interval:  145 QRS Duration:  91 QT Interval:  344 QTC Calculation: 449 R Axis:   23  Text Interpretation: Sinus tachycardia Low voltage no STEMI No significant change from previous EKG on 03/15/2024 Confirmed by Gennaro Bouchard ([id number]) on 03/17/2024 7:48:08 PM  Radiology: No results found.   Procedures   Medications Ordered in the ED - No data to display                                  Medical Decision Making Medical Decision Making Nursing notes are reviewed. Differential diagnosis for this patient would include but not limited to: Palpitations, electrolyte abnormality, dehydration, anxiety, other  Records reviewed: Prior ED records reviewed and patient was seen at another ER and sick/25-25   EKG interpretation: Interpreted by me in the absence cardiologist no sinus rhythm, no ST or T wave abnormalities no acute change when compared to prior EKG  Cardiac monitor interpretation: Sinus rhythm, no ectopy   Emergency Department Course:  Vital signs and pulse oximetry are reviewed, evaluated by myself and found to be within normal limits prior to final disposition. Findings of laboratory testing and medical imaging are discussed with patient and family that is available. Patient agrees with the medical care plan as  follows:  Patient's lab workup reviewed by me and unremarkable.  She does not want to stay and wait for thyroid  so she has a follow-up with her primary care doctor Monday.  She will also can discuss Holter monitoring with them.  Her vitals have been stable in the ER.  Advised to return for any new or worsening symptoms.  She feels comfortable with plan to be discharged home.   Problems Addressed: Palpitations: undiagnosed new problem with uncertain prognosis  Amount and/or Complexity of Data Reviewed External  Data Reviewed: notes. Labs: ordered. Decision-making details documented in ED Course. ECG/medicine tests: ordered and independent interpretation performed. Decision-making details documented in ED Course.  Risk OTC drugs.     Final diagnoses:  Palpitations    ED Discharge Orders     None          Gennaro Duwaine CROME, DO 03/17/24 2257

## 2024-03-17 NOTE — Discharge Instructions (Signed)
 Drink lots of fluids.  Go to your primary care doctor appointment on Monday morning.  Discussed with them your thyroid  levels as well as your palpitations.  Asked them about a Holter monitor.  Return to the ER for any worsening symptoms.

## 2024-03-17 NOTE — ED Triage Notes (Signed)
 Pt reports palpitations that started today and reports they come and go. Pt reports her thyroid  levels were off last week when she was at [facility].

## 2024-03-20 ENCOUNTER — Ambulatory Visit: Attending: Family Medicine

## 2024-03-20 ENCOUNTER — Ambulatory Visit (INDEPENDENT_AMBULATORY_CARE_PROVIDER_SITE_OTHER): Admitting: Family Medicine

## 2024-03-20 ENCOUNTER — Telehealth: Payer: Self-pay | Admitting: Physician Assistant

## 2024-03-20 ENCOUNTER — Encounter: Payer: Self-pay | Admitting: Family Medicine

## 2024-03-20 VITALS — BP 126/91 | HR 100 | Ht 61.0 in | Wt 176.0 lb

## 2024-03-20 DIAGNOSIS — R002 Palpitations: Secondary | ICD-10-CM | POA: Diagnosis not present

## 2024-03-20 DIAGNOSIS — R7989 Other specified abnormal findings of blood chemistry: Secondary | ICD-10-CM | POA: Diagnosis not present

## 2024-03-20 DIAGNOSIS — I10 Essential (primary) hypertension: Secondary | ICD-10-CM

## 2024-03-20 DIAGNOSIS — E041 Nontoxic single thyroid nodule: Secondary | ICD-10-CM | POA: Diagnosis not present

## 2024-03-20 DIAGNOSIS — N840 Polyp of corpus uteri: Secondary | ICD-10-CM | POA: Diagnosis not present

## 2024-03-20 NOTE — Assessment & Plan Note (Addendum)
 Diastolic pressure mildly elevated today.  She has been faithful with her medications.  Discussed and through shared decision making.  May consider echocardiogram in the future given her history of LVH. -Continue amlodipine  10 mg daily, spironolactone  50 mg daily -Patient will monitor her blood pressure at home - Return in approximately 1 month for blood pressure recheck - Return precautions given

## 2024-03-20 NOTE — Telephone Encounter (Signed)
 Called and spoke with patient. Patient states that she has been on Voquezna  20 mg for about a month and a half. Just started experiencing chest palpitations last Thursday. Patient was seen in ER, has follow up with PCP this afternoon. They did not check thyroid  levels at ER. I told patient that it is likely a coincidence and not related to Voquezna  since symptoms just started and she has been on the medication for some time now. I told patient that I will check with the provider to see if they have any recommendations.

## 2024-03-20 NOTE — Telephone Encounter (Signed)
 Voquezna  can cause palpitations in less than 1 %, unlikely however for you having been on it for a month to 2 months for this just now to be causing this.  Suggest following up with primary care.  Evaluate thyroid , electrolytes, etc. Let us  know how you are doing, schedule follow-up.

## 2024-03-20 NOTE — Telephone Encounter (Addendum)
 Called and informed patient of Tamara Church's recommendations. PCP ordered thyroid  US  for further evaluation of thyroid  nodule. Patient will call back to schedule a follow up appt if workup of thyroid  unrevealing. Patient verbalized understanding and had no concerns at the end of the call.

## 2024-03-20 NOTE — Telephone Encounter (Signed)
 Pt last saw [facility] 6/17

## 2024-03-20 NOTE — Telephone Encounter (Signed)
 Inbound call from patient, would like to speak with a nurse in regards to Voquenza. Patient states she had to go to the ED on Friday due to heart palpitations that she believes is being caused by the medication. She would like to speak with a nurse in regards to medication change.   Routed to Pod C because patient is under Dr. Wilhelmenia.

## 2024-03-20 NOTE — Progress Notes (Signed)
    SUBJECTIVE:   CHIEF COMPLAINT / HPI:   [name] Patient presented to the ED on 6/27 with concerns of palpitations.  EKG at that time with sinus tachycardia. Just prior to that on 6/24 she presented to the ED for sensation of something in her esophagus, nausea.  At this time there is no evidence of PE, though a thyroid  nodule was noted - US  f/u recommended by radiologist. Last TSH WNL in 2024, though on chart review patient has had elevated TSH in the past.  She tells me today that no one has ever told her she has had elevated TSH, and she has never taken Synthroid or any other medication for her thyroid .  Today she tells me that she is still having palpitations, feels like her heart is racing and she is fidgety, anxious. She has never felt this way before. It is interfering with her daily life.  She is also anxious about possible thyroid  nodule and endometrial polyp seen on imaging at the ED.  She has normal monthly menstrual cycles which are predictable and not heavy.  Next cycle due to start tomorrow or the next day.  PERTINENT  PMH / PSH: Reviewed.  OBJECTIVE:   BP (!) 126/91   Pulse 100   Ht 5' 1 (1.549 m)   Wt 176 lb (79.8 kg)   SpO2 100%   BMI 33.25 kg/m    General: Well-appearing, no acute distress. HEENT: normocephalic, PERRLA, EOM grossly intact, MMM. Cardio: Tachycardic, regular rhythm, no murmurs on exam. Pulm: Clear, no wheezing, no crackles. No increased work of breathing. Abdominal: bowel sounds present, soft, non-tender, non-distended. Extremities: no peripheral edema. Moves all extremities equally. Neuro: Alert and oriented x3, speech normal in content, no facial asymmetry. Psych:  Cognition and judgment appear intact. Alert, communicative, and cooperative.   ASSESSMENT/PLAN:   Assessment & Plan Heart palpitations In the setting of prior high TSH and thyroid  nodule seen on imaging, suspect this is related to thyroid  derangement. - TSH today - Zio  patch ordered Thyroid  nodule Will obtain repeat imaging. - Thyroid  ultrasound scheduled Endometrial polyp Benign appearance on prior ultrasound.  It is possible that this will be shed with the endometrial lining during next menstrual cycle. - Repeat transvaginal ultrasound scheduled per patient request HYPERTENSION, BENIGN SYSTEMIC Diastolic pressure mildly elevated today.  She has been faithful with her medications.  Discussed and through shared decision making.  May consider echocardiogram in the future given her history of LVH. -Continue amlodipine  10 mg daily, spironolactone  50 mg daily -Patient will monitor her blood pressure at home - Return in approximately 1 month for blood pressure recheck - Return precautions given    Lauraine Norse, DO [facility]

## 2024-03-20 NOTE — Patient Instructions (Addendum)
 It was so good to see you today! Thank you for allowing me to take care of you.  Today we discussed the following concerns and plans:  Heart racing - we will check labs today - I have ordered a Zio patch - it will be mailed to your house with instructions. If you have questions you can get an appointment to bring it back to the clinic for us  to show you how to use it.  We will also get an ultrasound of your thyroid  and of your uterus to follow-up on your previous imaging.  If you have any concerns, please call the clinic or schedule an appointment.  It was a pleasure to take care of you today. Be well!  Lauraine Norse, DO [facility], PGY-1   Don't forget to check out the [facility] in the [facility] at [address] [phone] Affordable prices on prescriptions and over-the-counter items, as well as services like vaccinations and medication home delivery.

## 2024-03-21 ENCOUNTER — Other Ambulatory Visit: Payer: Self-pay | Admitting: Family Medicine

## 2024-03-21 ENCOUNTER — Telehealth: Payer: Self-pay

## 2024-03-21 ENCOUNTER — Ambulatory Visit: Payer: Self-pay | Admitting: Family Medicine

## 2024-03-21 DIAGNOSIS — R7989 Other specified abnormal findings of blood chemistry: Secondary | ICD-10-CM

## 2024-03-21 DIAGNOSIS — E041 Nontoxic single thyroid nodule: Secondary | ICD-10-CM

## 2024-03-21 DIAGNOSIS — R002 Palpitations: Secondary | ICD-10-CM

## 2024-03-21 LAB — TSH: TSH: 10.9 u[IU]/mL — ABNORMAL HIGH (ref 0.450–4.500)

## 2024-03-21 NOTE — Telephone Encounter (Signed)
 Patient calls nurse line reporting she missed a call from our office.   I do not see any notes, however she had lab work done yesterday.   Will forward to provider who saw patient.

## 2024-03-21 NOTE — Patient Outreach (Signed)
 CMRN outreach to patient due to chart review revealing ED visits and PCP visit since previous CMRN visit 03/08/24. Patient reports that she is doing better today. She feels that swallowing is improving a little bit. Thyroid  u/s scheduled 03/27/24. Patient reports she has been taking her medications as ordered. She denies feelings of anxiety at time of call.   Discussed ED visits and testing ordered by PCP. Patient reports palpitations almost daily since Thursday of last week. She understands that Zio patch will be mailed to her. No concerns for CMRN at this time. Will continue to follow-up as scheduled 04/07/24.  Rosaline Finlay, RN MSN [place]  VBCI Population Health RN Care Manager Direct Dial: [phone]  Fax: [phone]

## 2024-03-21 NOTE — Progress Notes (Unsigned)
 EP to read.

## 2024-03-22 ENCOUNTER — Encounter (HOSPITAL_COMMUNITY): Payer: Self-pay | Admitting: Emergency Medicine

## 2024-03-22 ENCOUNTER — Emergency Department (HOSPITAL_COMMUNITY): Admission: EM | Admit: 2024-03-22 | Discharge: 2024-03-22 | Disposition: A | Discharge status: Home / Self Care

## 2024-03-22 ENCOUNTER — Other Ambulatory Visit: Payer: Self-pay

## 2024-03-22 DIAGNOSIS — I10 Essential (primary) hypertension: Secondary | ICD-10-CM | POA: Insufficient documentation

## 2024-03-22 DIAGNOSIS — R1010 Upper abdominal pain, unspecified: Secondary | ICD-10-CM | POA: Insufficient documentation

## 2024-03-22 DIAGNOSIS — R112 Nausea with vomiting, unspecified: Secondary | ICD-10-CM | POA: Diagnosis not present

## 2024-03-22 DIAGNOSIS — R1013 Epigastric pain: Secondary | ICD-10-CM | POA: Diagnosis not present

## 2024-03-22 LAB — COMPREHENSIVE METABOLIC PANEL WITH GFR
ALT: 22 U/L (ref 0–44)
AST: 19 U/L (ref 15–41)
Albumin: 4.2 g/dL (ref 3.5–5.0)
Alkaline Phosphatase: 58 U/L (ref 38–126)
Anion gap: 14 (ref 5–15)
BUN: 18 mg/dL (ref 6–20)
CO2: 23 mmol/L (ref 22–32)
Calcium: 9.6 mg/dL (ref 8.9–10.3)
Chloride: 98 mmol/L (ref 98–111)
Creatinine, Ser: 0.99 mg/dL (ref 0.44–1.00)
GFR, Estimated: >60 mL/min (ref >60)
Glucose, Bld: 117 mg/dL — ABNORMAL HIGH (ref 70–99)
Potassium: 4.1 mmol/L (ref 3.5–5.1)
Sodium: 135 mmol/L (ref 135–145)
Total Bilirubin: 0.3 mg/dL (ref 0.0–1.2)
Total Protein: 9 g/dL — ABNORMAL HIGH (ref 6.5–8.1)

## 2024-03-22 LAB — URINALYSIS, ROUTINE W REFLEX MICROSCOPIC
Bilirubin Urine: NEGATIVE
Glucose, UA: NEGATIVE mg/dL
Hgb urine dipstick: NEGATIVE
Ketones, ur: NEGATIVE mg/dL
Leukocytes,Ua: NEGATIVE
Nitrite: NEGATIVE
Protein, ur: NEGATIVE mg/dL
Specific Gravity, Urine: 1.003 — ABNORMAL LOW (ref 1.005–1.030)
pH: 6 (ref 5.0–8.0)

## 2024-03-22 LAB — LIPASE, BLOOD: Lipase: 32 U/L (ref 11–51)

## 2024-03-22 LAB — CBC
HCT: 36.2 % (ref 36.0–46.0)
Hemoglobin: 11.8 g/dL — ABNORMAL LOW (ref 12.0–15.0)
MCH: 29.6 pg (ref 26.0–34.0)
MCHC: 32.6 g/dL (ref 30.0–36.0)
MCV: 90.7 fL (ref 80.0–100.0)
Platelets: 285 10*3/uL (ref 150–400)
RBC: 3.99 MIL/uL (ref 3.87–5.11)
RDW: 13.3 % (ref 11.5–15.5)
WBC: 10.9 10*3/uL — ABNORMAL HIGH (ref 4.0–10.5)
nRBC: 0 % (ref 0.0–0.2)

## 2024-03-22 LAB — HCG, SERUM, QUALITATIVE: Preg, Serum: NEGATIVE

## 2024-03-22 MED ORDER — SODIUM CHLORIDE 0.9 % IV SOLN
12.5000 mg | Freq: Four times a day (QID) | INTRAVENOUS | Status: DC | PRN
  Administered 2024-03-22: 12.5 mg via INTRAVENOUS
  Filled 2024-03-22: qty 12.5

## 2024-03-22 MED ORDER — LACTATED RINGERS IV BOLUS
1000.0000 mL | Freq: Once | INTRAVENOUS | Status: AC
  Administered 2024-03-22: 1000 mL via INTRAVENOUS

## 2024-03-22 MED ORDER — HYOSCYAMINE SULFATE 0.125 MG SL SUBL
0.2500 mg | SUBLINGUAL_TABLET | Freq: Once | SUBLINGUAL | Status: AC
  Administered 2024-03-22: 0.25 mg via SUBLINGUAL
  Filled 2024-03-22: qty 2

## 2024-03-22 MED ORDER — LIDOCAINE VISCOUS HCL 2 % MT SOLN
15.0000 mL | Freq: Once | OROMUCOSAL | Status: AC
  Administered 2024-03-22: 15 mL via ORAL
  Filled 2024-03-22: qty 15

## 2024-03-22 MED ORDER — ONDANSETRON 4 MG PO TBDP: 4.0000 mg | ORAL_TABLET | Freq: Three times a day (TID) | ORAL | 0 refills | Status: DC | PRN

## 2024-03-22 MED ORDER — KETOROLAC TROMETHAMINE 15 MG/ML IJ SOLN
15.0000 mg | Freq: Once | INTRAMUSCULAR | Status: AC
  Administered 2024-03-22: 15 mg via INTRAVENOUS
  Filled 2024-03-22: qty 1

## 2024-03-22 MED ORDER — PROCHLORPERAZINE EDISYLATE 10 MG/2ML IJ SOLN
10.0000 mg | Freq: Once | INTRAMUSCULAR | Status: AC
  Administered 2024-03-22: 10 mg via INTRAVENOUS
  Filled 2024-03-22: qty 2

## 2024-03-22 MED ORDER — DIPHENHYDRAMINE HCL 50 MG/ML IJ SOLN
25.0000 mg | Freq: Once | INTRAMUSCULAR | Status: AC
  Administered 2024-03-22: 25 mg via INTRAVENOUS
  Filled 2024-03-22: qty 1

## 2024-03-22 MED ORDER — ALUM & MAG HYDROXIDE-SIMETH 200-200-20 MG/5ML PO SUSP
30.0000 mL | Freq: Once | ORAL | Status: AC
  Administered 2024-03-22: 30 mL via ORAL
  Filled 2024-03-22: qty 30

## 2024-03-22 NOTE — ED Provider Notes (Signed)
 [place] EMERGENCY DEPARTMENT AT [facility] Provider Note   CSN: [id number] Arrival date & time: 03/22/24  8077     Patient presents with: Nausea, Abdominal Pain, and Gastroesophageal Reflux   Tamara Church is a 48 y.o. female.   48 year old female with past medical history of hypertension and chronic abdominal pain presenting to the emergency department today with nausea and upper abdominal pain.  The patient states that this has been going on since about 5:30 PM.  She states that this started a few hours after eating.  She has been able to handle her secretions.  Denies any vomiting.  She reports normal bowel movements up until today.  She denies any fevers.  She states that she tried Reglan  at home and was still having symptoms so she came to the ER for further evaluation.  She reports that this is a recurring issue and that she has an appointment scheduled to follow-up with [facility] gastro[facility]ology in the next few weeks.  She denies eating prior to this and does not think that she has a food impaction.   Abdominal Pain Gastroesophageal Reflux Associated symptoms include abdominal pain.       Prior to Admission medications   Medication Sig Start Date End Date Taking? Authorizing Provider  ondansetron  (ZOFRAN -ODT) 4 MG disintegrating tablet Take 1 tablet (4 mg total) by mouth every 8 (eight) hours as needed for nausea or vomiting. 03/22/24  Yes Ula Prentice SAUNDERS, MD  amitriptyline  (ELAVIL ) 10 MG tablet Take 1 tablet (10 mg total) by mouth at bedtime. 03/07/24   Craig Alan SAUNDERS, PA-C  amLODipine  (NORVASC ) 10 MG tablet Take 1 tablet by mouth once daily 01/31/24   Cleotilde Perkins, DO  folic acid  (FOLVITE ) 1 MG tablet Take 1 tablet (1 mg total) by mouth daily. 10/13/23   Mansouraty, Aloha Raddle., MD  hydrOXYzine  (ATARAX ) 10 MG tablet Take 1 tablet (10 mg total) by mouth 2 (two) times daily. 07/21/23   Mansouraty, Aloha Raddle., MD  hyoscyamine  (LEVSIN  SL) 0.125 MG SL tablet Place 1  tablet (0.125 mg total) under the tongue every 6 (six) hours as needed for cramping (spasm of esophagus). 03/07/24   Craig Alan SAUNDERS, PA-C  metoCLOPramide  (REGLAN ) 10 MG tablet Take 1 tablet (10 mg total) by mouth every 6 (six) hours. 03/07/24   Craig Alan SAUNDERS, PA-C  promethazine  (PHENERGAN ) 25 MG tablet Take 1 tablet (25 mg total) by mouth every 6 (six) hours as needed for nausea or vomiting. 03/07/24   Craig Alan SAUNDERS, PA-C  spironolactone  (ALDACTONE ) 25 MG tablet Take 2 tablets (50 mg total) by mouth at bedtime. 09/30/23   Cleotilde Perkins, DO  Vonoprazan Fumarate  (VOQUEZNA ) 20 MG TABS Take 20 mg by mouth daily. 02/11/24   Haviland, Julie, MD    Allergies: Hctz [hydrochlorothiazide ], Ace inhibitors, and Angiotensin receptor blockers    Review of Systems  Gastrointestinal:  Positive for abdominal pain.  All other systems reviewed and are negative.   Updated Vital Signs BP (!) 138/100 (BP Location: Right Arm)   Pulse (!) 108   Temp (!) 97.5 F (36.4 C) (Oral)   Resp 17   SpO2 100%   Physical Exam Vitals and nursing note reviewed.   Gen: NAD Eyes: PERRL, EOMI HEENT: no oropharyngeal swelling Neck: trachea midline Resp: clear to auscultation bilaterally Card: Tachycardic, no murmurs, rubs, or gallops Abd: nontender, nondistended Extremities: no calf tenderness, no edema Vascular: 2+ radial pulses bilaterally, 2+ DP pulses bilaterally Neuro: No focal deficits Skin:  no rashes Psyc: acting appropriately   (all labs ordered are listed, but only abnormal results are displayed) Labs Reviewed  COMPREHENSIVE METABOLIC PANEL WITH GFR - Abnormal; Notable for the following components:      Result Value   Glucose, Bld 117 (*)    Total Protein 9.0 (*)    All other components within normal limits  CBC - Abnormal; Notable for the following components:   WBC 10.9 (*)    Hemoglobin 11.8 (*)    All other components within normal limits  URINALYSIS, ROUTINE W REFLEX MICROSCOPIC -  Abnormal; Notable for the following components:   Color, Urine STRAW (*)    APPearance HAZY (*)    Specific Gravity, Urine 1.003 (*)    All other components within normal limits  LIPASE, BLOOD  HCG, SERUM, QUALITATIVE    EKG: EKG Interpretation Date/Time:  Wednesday March 22 2024 20:34:15 EDT Ventricular Rate:  115 PR Interval:  163 QRS Duration:  92 QT Interval:  347 QTC Calculation: 480 R Axis:   64  Text Interpretation: Sinus tachycardia Confirmed by Ula Barter [id number] on 03/22/2024 9:51:48 PM  Radiology: No results found.   Procedures   Medications Ordered in the ED  promethazine  (PHENERGAN ) 12.5 mg in sodium chloride  0.9 % 50 mL IVPB (0 mg Intravenous Stopped 03/22/24 2150)  lactated ringers  bolus 1,000 mL (0 mLs Intravenous Stopped 03/22/24 2150)  alum & mag hydroxide-simeth (MAALOX/MYLANTA) 200-200-20 MG/5ML suspension 30 mL (30 mLs Oral Given 03/22/24 2023)    And  lidocaine  (XYLOCAINE ) 2 % viscous mouth solution 15 mL (15 mLs Oral Given 03/22/24 2023)  hyoscyamine  (LEVSIN  SL) SL tablet 0.25 mg (0.25 mg Sublingual Given 03/22/24 2150)  ketorolac  (TORADOL ) 15 MG/ML injection 15 mg (15 mg Intravenous Given 03/22/24 2221)  prochlorperazine  (COMPAZINE ) injection 10 mg (10 mg Intravenous Given 03/22/24 2222)  diphenhydrAMINE  (BENADRYL ) injection 25 mg (25 mg Intravenous Given 03/22/24 2222)                                    Medical Decision Making 48 year old female with past medical history of chronic abdominal pain presenting to the emergency department today with upper abdominal pain and nausea.  I will further evaluate the patient here with basic labs including LFTs and a lipase to evaluate for hepatobiliary pathology or pancreatitis.  Will obtain an hCG to evaluate for intrauterine versus ectopic pregnancy.  Will give the patient IV fluids here and obtain an EKG given her tachycardia.  I will give her a [name] here as well as viscous lidocaine  and Phenergan  and see if this  helps with her symptoms.  Without eating or drinking prior to this suspicion for esophageal food impaction is low at this time.  She states that she has improved with symptomatic treatment in the emergency department in the past.  Will try this and hold off on imaging at this time as the patient's abdominal exam is benign and she is status post cholecystectomy.  Will reevaluate for ultimate disposition.  The patient's labs here are reassuring.  Her heart rate remained mildly elevated.  She has presented very similarly in the past.  States that her heart rate is elevated after taking Reglan .  Her TSH is elevated but T4 is still pending.  Unclear if this is due to actual hypothyroidism versus subclinical hypothyroidism.  Will hold off on starting any medications.  Heart rate is in the low  100s on reassessment.  She is encouraged to follow-up with her primary care provider.  She is discharged with return precautions.  Amount and/or Complexity of Data Reviewed Labs: ordered.  Risk OTC drugs. Prescription drug management.        Final diagnoses:  Nausea and vomiting, unspecified vomiting type    ED Discharge Orders          Ordered    ondansetron  (ZOFRAN -ODT) 4 MG disintegrating tablet  Every 8 hours PRN        03/22/24 2256               Ula Prentice SAUNDERS, MD 03/22/24 2257

## 2024-03-22 NOTE — Discharge Instructions (Signed)
 Your workup today was reassuring.  Please follow-up with your specialist at [facility].  Your thyroid  testing is abnormal but we are still waiting on some of the test come back.  Please follow-up with your primary care doctor regarding this and return to the ER for worsening symptoms.

## 2024-03-22 NOTE — ED Triage Notes (Signed)
 Patient feels as though she has something stuck in her throat. She doesn't believe anything is actually there, but it is causing nausea. She also complains of acid reflux. She reports esophageal stricture. Denies shortness of breath.

## 2024-03-23 ENCOUNTER — Other Ambulatory Visit: Payer: Self-pay | Admitting: Family Medicine

## 2024-03-23 ENCOUNTER — Telehealth: Payer: Self-pay

## 2024-03-23 DIAGNOSIS — R7989 Other specified abnormal findings of blood chemistry: Secondary | ICD-10-CM

## 2024-03-23 DIAGNOSIS — E039 Hypothyroidism, unspecified: Secondary | ICD-10-CM

## 2024-03-23 LAB — SPECIMEN STATUS REPORT

## 2024-03-23 LAB — T4, FREE: Free T4: 0.64 ng/dL — AB (ref 0.82–1.77)

## 2024-03-23 MED ORDER — LEVOTHYROXINE SODIUM 112 MCG PO TABS: 112.0000 ug | ORAL_TABLET | ORAL | 1 refills | Status: DC

## 2024-03-23 NOTE — Telephone Encounter (Signed)
-----   Message from Lauraine Norse sent at 03/23/2024 10:27 AM EDT ----- Regarding: results Please call pt and let her know that her thyroid  results are back and they do show that she needs thyroid  medication.   I have sent in levothyroxine 112 mcg to her pharmacy. She should take this once a day in the morning 30 minutes before eating (do not take with any other medications or foods).  Needs follow up in 4-6 weeks to recheck thyroid  labs. She may schedule lab visit or physician visit.  Thanks! Lauraine

## 2024-03-23 NOTE — Telephone Encounter (Signed)
 Called patient and informed her of thyroid  medication at pharmacy.  Patient states that she is going thru a crisis with tachycardia.  Patient was at the ED last night and states that she is going now to pick up medication.  Patient given precautions to return to ED if needed.  She will follow up in 4 to 6.  Tamara Church, CMA

## 2024-03-27 ENCOUNTER — Ambulatory Visit (HOSPITAL_COMMUNITY): Admission: RE | Admit: 2024-03-27 | Source: Ambulatory Visit

## 2024-03-27 ENCOUNTER — Other Ambulatory Visit: Payer: Self-pay | Admitting: Gastroenterology

## 2024-03-27 DIAGNOSIS — E876 Hypokalemia: Secondary | ICD-10-CM

## 2024-03-27 DIAGNOSIS — K2100 Gastro-esophageal reflux disease with esophagitis, without bleeding: Secondary | ICD-10-CM

## 2024-03-27 DIAGNOSIS — R1013 Epigastric pain: Secondary | ICD-10-CM

## 2024-03-27 DIAGNOSIS — R110 Nausea: Secondary | ICD-10-CM

## 2024-03-27 DIAGNOSIS — K2970 Gastritis, unspecified, without bleeding: Secondary | ICD-10-CM

## 2024-03-28 ENCOUNTER — Encounter: Payer: Self-pay | Admitting: Family Medicine

## 2024-03-28 ENCOUNTER — Ambulatory Visit: Admitting: Family Medicine

## 2024-03-28 VITALS — BP 124/86 | HR 88 | Ht 61.0 in | Wt 176.0 lb

## 2024-03-28 DIAGNOSIS — R002 Palpitations: Secondary | ICD-10-CM | POA: Diagnosis present

## 2024-03-28 NOTE — Patient Instructions (Signed)
 1) Wear the ziopatch for 14 days, then send it over following the instructions in the booklet. - If anything is abnormal, we will reach out to you with the results.

## 2024-03-28 NOTE — Progress Notes (Signed)
    SUBJECTIVE:   CHIEF COMPLAINT / HPI:   Tamara Church is a 48 year old F that presents for follow-up of palpitations. - Patient is stable, no new complaints.  She brings in her Zio patch today for assistance with placement.  PERTINENT  PMH / PSH: Hypertension, GERD, anxiety  OBJECTIVE:   BP 124/86   Pulse 88   Ht 5' 1 (1.549 m)   Wt 176 lb (79.8 kg)   SpO2 100%   BMI 33.25 kg/m   General: Alert, pleasant woman. NAD. HEENT: NCAT. MMM. CV: RRR, no murmurs. Cap refill <2. Resp: CTAB, no wheezing or crackles. Normal WOB on RA.  Abm: Soft, nontender, nondistended. BS present. Ext: Moves all ext spontaneously Skin: Warm, well perfused   ASSESSMENT/PLAN:   Assessment & Plan Palpitations Vital signs stable today.  Zio patch placed in clinic.  Reviewed instructions on event monitoring and Zio patch care.  Instructed to mail Zio patch back after 14 days.     Tamara Nearing, MD [facility]

## 2024-03-30 ENCOUNTER — Encounter: Admitting: Vascular Surgery

## 2024-03-30 ENCOUNTER — Encounter (HOSPITAL_COMMUNITY)

## 2024-03-31 ENCOUNTER — Telehealth: Payer: Self-pay

## 2024-03-31 NOTE — Telephone Encounter (Signed)
 Called patient and provided message per Dr. Cleotilde.   Patient will follow up in office on Monday.   Tamara JAYSON English, RN

## 2024-03-31 NOTE — Telephone Encounter (Signed)
 Patient calls nurse line regarding concerns with medication side effects since starting Synthroid  112 mcg.   Medication was started on 03/23/24. She started having side effects on Tuesday, 03/28/24. She reports she has been experiencing nausea, palpitations, headache and intermittent jitteriness.   She reports that she did not take medication today and is feeling fine currently with no symptoms.   Recommended that patient schedule follow up visit to discuss further. She is on her way to [facility] right now for a funeral. Scheduled her for follow up on Monday afternoon with PCP.   Dr Cleotilde- how should patient proceed with medication this weekend? Should she continue current dosage or hold for now?   Chiquita JAYSON English, RN

## 2024-04-03 ENCOUNTER — Ambulatory Visit (INDEPENDENT_AMBULATORY_CARE_PROVIDER_SITE_OTHER): Admitting: Student

## 2024-04-03 ENCOUNTER — Encounter: Payer: Self-pay | Admitting: Student

## 2024-04-03 VITALS — BP 116/72 | HR 76 | Wt 177.4 lb

## 2024-04-03 DIAGNOSIS — E039 Hypothyroidism, unspecified: Secondary | ICD-10-CM | POA: Diagnosis present

## 2024-04-03 MED ORDER — LEVOTHYROXINE SODIUM 100 MCG PO TABS: 100.0000 ug | ORAL_TABLET | ORAL | 0 refills | Status: DC

## 2024-04-03 NOTE — Progress Notes (Signed)
    SUBJECTIVE:   CHIEF COMPLAINT / HPI:   Tamara Church is a 48 y.o. female presenting for hypothyroidism follow-up.  She has recently started on 12 mcg Synthroid .  Patient reports she took this for 6 to 7 days and started having increased palpitations and jitteriness.  When she stopped the medications her symptoms improved.    PERTINENT  PMH / PSH: reviewed and updated.  OBJECTIVE:   BP 116/72   Pulse 76   Wt 177 lb 6.4 oz (80.5 kg)   SpO2 100%   BMI 33.52 kg/m   Well-appearing, no acute distress Cardio: Regular rate, regular rhythm, no murmurs on exam. Pulm: Clear, no wheezing, no crackles. No increased work of breathing Abdominal: bowel sounds present, soft, non-tender, non-distended Extremities: no peripheral edema   ASSESSMENT/PLAN:   Assessment & Plan Hypothyroidism, unspecified type Decreased Synthroid  to 100 mcg daily.  Instructed patient on proper technique for administration.  Lab appointment scheduled in 6 weeks for TSH re-draw     Damien Pinal, DO [facility]

## 2024-04-03 NOTE — Assessment & Plan Note (Signed)
 Decreased Synthroid  to 100 mcg daily.  Instructed patient on proper technique for administration.  Lab appointment scheduled in 6 weeks for TSH re-draw

## 2024-04-03 NOTE — Patient Instructions (Signed)
 It was great to see you today!   I have decreased your medication to 100 mcg daily. Take this 30 minutes before your other medications.   Future Appointments  Date Time Provider Department Center  04/07/2024  1:00 PM Arno Rosaline SQUIBB, RN [facility] None  04/10/2024  3:00 PM WL-US  2 WL-US  [place]  04/10/2024  4:00 PM WL-US  2 WL-US  [place]  04/17/2024  1:30 PM Cleotilde Perkins, DO [facility]  05/23/2024 11:45 AM [facility] LAB [facility]    Please arrive 15 minutes before your appointment to ensure smooth check in process.    Please call the clinic at [phone] if your symptoms worsen or you have any concerns.  Thank you for allowing me to participate in your care, Dr. Perkins Cleotilde [facility]

## 2024-04-06 ENCOUNTER — Telehealth: Payer: Self-pay

## 2024-04-06 NOTE — Telephone Encounter (Signed)
 Patient was diagnosed with hypothyroidism and started on 112 mcg 2 weeks ago, she had some symptoms of nausea, heart racing, anxiety. She was decreased to 100 mcg and began having similar symptoms. She has a lab appointment in 4 weeks to recheck TSH recommending to stop taking synthroid  at this time and recheck TSH in 6 weeks. If she is still showing signs of hypothyroidism will restart synthroid  at lowest dose.   Damien Pinal, DO [facility], PGY-3 04/06/24 11:14 AM

## 2024-04-06 NOTE — Telephone Encounter (Signed)
 Patient calls nurse line reporting undesired side effects to Levothyroxine  100mcg.   She reports she has been to continue experiencing nausea, palpitations, headache and intermittent jitteriness. She reports some dizziness as well, denies syncope episodes. No vision changes.   She denies any shortness of breath or chest pains.   She reports she started the 100mcg on 7/15. She reports she took a dose this morning.   Advised will forward to PCP for advisement.   Precautions discussed with patient.

## 2024-04-07 ENCOUNTER — Telehealth: Payer: Self-pay | Admitting: Physician Assistant

## 2024-04-07 ENCOUNTER — Telehealth: Payer: Self-pay

## 2024-04-07 DIAGNOSIS — K219 Gastro-esophageal reflux disease without esophagitis: Secondary | ICD-10-CM | POA: Diagnosis not present

## 2024-04-07 DIAGNOSIS — R1319 Other dysphagia: Secondary | ICD-10-CM | POA: Diagnosis not present

## 2024-04-07 DIAGNOSIS — K449 Diaphragmatic hernia without obstruction or gangrene: Secondary | ICD-10-CM | POA: Diagnosis not present

## 2024-04-07 NOTE — Telephone Encounter (Signed)
 PT saw [facility] specialist today that we referred her to and they advised that she needs to stop voquenza and increase the dosage on amitrityline. Please advise.

## 2024-04-07 NOTE — Telephone Encounter (Signed)
 Returned patient call & she was seen today by [facility] MD Dr. Suzen Barrio who recommends that she stop Voquenza and increase amitriptyline  dosage. States MD reviewed EGD from earlier this year & did not understand why Voquenza was needed. Pt does not want to stop medication until our providers review. Last seen with Alan for OV 03/07/24.

## 2024-04-07 NOTE — Patient Outreach (Signed)
 Care Coordination   04/07/2024 Name: Tamara Church MRN: [id number] DOB: June 21, 1976   Care Coordination Outreach Attempts:  An unsuccessful telephone outreach was attempted today to complete CMRN follow-up visit.  Follow Up Plan:  Additional outreach attempts will be made to complete CMRN follow-up visit.   Encounter Outcome:  No Answer. Voicemail box full, unable to leave message.   Rosaline Finlay, RN MSN [place]  VBCI Population Health RN Care Manager Direct Dial: [phone]  Fax: [phone]

## 2024-04-10 ENCOUNTER — Ambulatory Visit (HOSPITAL_COMMUNITY)
Admission: RE | Admit: 2024-04-10 | Discharge: 2024-04-10 | Disposition: A | Discharge status: Home / Self Care | Source: Ambulatory Visit | Attending: Family Medicine | Admitting: Family Medicine

## 2024-04-10 DIAGNOSIS — N840 Polyp of corpus uteri: Secondary | ICD-10-CM | POA: Insufficient documentation

## 2024-04-10 DIAGNOSIS — E041 Nontoxic single thyroid nodule: Secondary | ICD-10-CM | POA: Insufficient documentation

## 2024-04-11 NOTE — Telephone Encounter (Signed)
 Called and spoke with patient regarding your recommendations. Per patient, Dr. Kayla would like us  to advise on increased dose of Amitriptyline , patient is currently taking 10 mg at bedtime.

## 2024-04-11 NOTE — Telephone Encounter (Signed)
 Called and spoke with patient. Patient will increase Amitriptyline  to 20 mg nightly for 1-2 weeks, patient will update us  after this. If patient is doing well she is aware that Alan may potentially increase to Amitriptyline  25 mg nightly. Patient verbalized understanding and had no concerns at the end of the call.

## 2024-04-17 ENCOUNTER — Other Ambulatory Visit: Payer: Self-pay | Admitting: Family Medicine

## 2024-04-17 ENCOUNTER — Ambulatory Visit (INDEPENDENT_AMBULATORY_CARE_PROVIDER_SITE_OTHER): Admitting: Student

## 2024-04-17 ENCOUNTER — Encounter: Payer: Self-pay | Admitting: Student

## 2024-04-17 VITALS — BP 98/70 | HR 180 | Ht 61.0 in | Wt 184.2 lb

## 2024-04-17 DIAGNOSIS — I10 Essential (primary) hypertension: Secondary | ICD-10-CM

## 2024-04-17 DIAGNOSIS — R000 Tachycardia, unspecified: Secondary | ICD-10-CM | POA: Diagnosis not present

## 2024-04-17 DIAGNOSIS — N840 Polyp of corpus uteri: Secondary | ICD-10-CM

## 2024-04-17 NOTE — Telephone Encounter (Signed)
 Called pt, confirmed DOB. Discussed recent US  results and answered all questions. Plan to refer to OBGYN for further evaluation as necessary. Pt in agreement.

## 2024-04-17 NOTE — Patient Instructions (Signed)
 It was great to see you today!   Keep your lab appointment in September. I will be in touch with you with the results.   Future Appointments  Date Time Provider Department Center  05/23/2024 11:45 AM [facility]    Please arrive 15 minutes before your appointment to ensure smooth check in process.    Please call the clinic at [phone] if your symptoms worsen or you have any concerns.  Thank you for allowing me to participate in your care, Dr. Damien Pinal [facility]

## 2024-04-17 NOTE — Assessment & Plan Note (Addendum)
 Blood pressure stable. Mildly hypotensive but asymptomatic. Continue amlodipine  10 mg and Aldactone  25 mg

## 2024-04-17 NOTE — Progress Notes (Signed)
    SUBJECTIVE:   CHIEF COMPLAINT / HPI:   Tamara Church is here for follow-up BP recheck after prior appointment on 6/30 showed mildly elevated diastolic blood pressure. Patient denies chest pain, SOB, dizziness, syncope, palpitations, or fevers.   PERTINENT  PMH / PSH: HTN, GERD, Anxiety  OBJECTIVE:   BP 98/70   Pulse (!) 180   Ht 5' 1 (1.549 m)   Wt 184 lb 3.2 oz (83.6 kg)   SpO2 100%   BMI 34.80 kg/m    General: Patient is well-appearing, in no acute distress Cardiovascular: Tachycardic, normal rhythm, no M/R/G.  Respiratory: Normal WOB on RA, lung fields clear throughout, no crackles/wheezes Neuro: Alert and oriented x3, speech normal, no facial asymmetry   ASSESSMENT/PLAN:   Assessment & Plan HYPERTENSION, BENIGN SYSTEMIC Blood pressure stable. Mildly hypotensive but asymptomatic. Continue amlodipine  10 mg and Aldactone  25 mg  Tachycardia - Patients heart rate was elevated to 180 with a pulse oximeter, BP at 98/70 - EKG in clinic performed, normal sinus rhythm, normal axis, no evidence of ischemia or arrhythmia, HR 97 - Recommended return precautions if patient experiences chest pain, SOB, or palpitations.   - most likely 2/2 to dehydration, encourage p.o. intake     Tamara Church, Medical Student [facility]  Resident Addendum I have separately seen and examined the patient.  I have discussed the findings and exam with the medical student and agree with the above note.  I helped develop the management plan that is described in the student's note and I agree with the content.   Tamara Pinal, DO [facility], PGY-3 04/17/24 2:41 PM

## 2024-04-19 ENCOUNTER — Other Ambulatory Visit

## 2024-04-19 NOTE — Patient Instructions (Signed)
 Visit Information  Ms. Men was given information about Medicaid Managed Care team care coordination services as a part of their [facility] Community Plan Medicaid benefit.   If you would like to schedule transportation through your [facility], please call the following number at least 2 days in advance of your appointment: [phone]   Rides for urgent appointments can also be made after hours by calling Member Services.  Call the Behavioral Health Crisis Line at [phone], at any time, 24 hours a day, 7 days a week. If you are in danger or need immediate medical attention call 911.   Ms. Carmickle - following are the goals we discussed in your visit today:   Goals Addressed             This Visit's Progress    VBCI RN Care Plan related to GERD/dysphagia   On track    Problems:  Chronic Disease Management support and education needs related to GERD and dysphagia  Goal: Over the next 30 days the Patient will demonstrate a decrease GERD in exacerbations as evidenced by decreased visits to the emergency room (11 in the past 6 months) take all medications exactly as prescribed and will call provider for medication related questions as evidenced by patient report of medication compliance    Work with GI to establish medication regimen for GI issues/concerns. Patient will update GI as instructed Obtain BP monitor for home monitoring  Interventions:   Evaluation of current treatment plan related to GERD and dysphagia self-management and patient's adherence to plan as established by provider. Discussed plans with patient for ongoing care management follow up and provided patient with direct contact information for care management team Evaluation of current treatment plan related to GERD/dysphagia and patient's adherence to plan as established by provider Reviewed medications with patient and discussed medication compliance as advised by GI  Patient  Self-Care Activities:  Attend all scheduled provider appointments Call provider office for new concerns or questions  Take medications as prescribed    Plan:  Telephone follow up appointment with care management team member scheduled for:  05/17/24 at 2 PM             The patient verbalized understanding of instructions, educational materials, and care plan provided today and DECLINED offer to receive copy of patient instructions, educational materials, and care plan.   Patient                                              will update GI after 1-2 weeks of taking increased dose of Amitriptyline . Telephone follow up appointment with Managed Medicaid care management team member scheduled for: 05/17/24 at 2 PM  Rosaline Finlay, RN MSN [place]  VBCI Population Health RN Care Manager Direct Dial: [phone]  Fax: [phone]   Following is a copy of your plan of care:  There are no care plans that you recently modified to display for this patient.

## 2024-04-19 NOTE — Patient Outreach (Signed)
 Complex Care Management   Visit Note  04/19/2024  Name:  Tamara Church MRN: [id number] DOB: December 26, 1975  Situation: Referral received for Complex Care Management related to GERD/dysphagia I obtained verbal consent from Patient.  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   Gastritis and gastroduodenitis 07/27/2020    Assessment: Patient Reported Symptoms:  Cognitive Cognitive Status: Able to follow simple commands, Alert and oriented to person, place, and time, Normal speech and language skills Cognitive/Intellectual Conditions Management [RPT]: None reported or documented in medical history or problem list      Neurological Neurological Review of Symptoms: Not assessed    HEENT HEENT Symptoms Reported: Not assessed      Cardiovascular Cardiovascular Symptoms Reported: No symptoms reported Does patient have uncontrolled Hypertension?: Yes Is patient checking Blood Pressure at home?: No Patient's Recent BP reading at home: Patieint reports BP was low in office the other day. Per chart review, BP 98/70. Patient denies symptoms, but thinks it was related to dehydration. Cardiovascular Management Strategies: Medication therapy Cardiovascular Comment: Patient notes that she does not have a BP cuff. She does plan to purchase one this week. Patient has worn 14-day zio patch, removed last week and mailed back in. She is waiting results.  Respiratory Respiratory Symptoms Reported: Not assesed    Endocrine Endocrine Symptoms Reported: No symptoms reported Is patient diabetic?: No Endocrine Comment: Per chart review patient was having side effects with synthroid . Medication was discontinued until further lab work is done in September. She denies palpitations at time of assessment.  Gastrointestinal  Gastrointestinal Symptoms Reported: No symptoms reported Additional Gastrointestinal Details: Patient denies nausea for the past month. Patient denies feeling as though her food is getting stuck. She has been eating smaller portions. Patient reports she has gained a couple of pounds back (had lost over 30 pounds in the past year) Gastrointestinal Management Strategies: Diet modification, Medication therapy Gastrointestinal Comment: Patient had visit with esophageal center at [facility] 04/07/24. She was told there is no surgery for her esophageal issues (related to nerves controlling esophagous per patient), but they did advise to increase her Amitriptyline  and stop the Voquenza. She does not have any other follow-up with [facility]. Per chart review, patient reached out to GI regarding medication change recommendations. She has increased her Amitryptyline as ordered to 20 mg nightly and will update GI after 1-2 weeks. She did try to stop Voquenza but had return of nausea, so she has resumed medication.    Genitourinary Genitourinary Symptoms Reported: Not assessed    Integumentary Integumentary Symptoms Reported: Not assessed    Musculoskeletal Musculoskelatal Symptoms Reviewed: Not assessed        Psychosocial Psychosocial Symptoms Reported: No symptoms reported            04/17/2024    1:28 PM  Depression screen PHQ 2/9  Decreased Interest 0  Down, Depressed, Hopeless 0  PHQ - 2 Score 0  Altered sleeping 0  Tired, decreased energy 0  Change in appetite 0  Feeling bad or failure about yourself  0  Trouble concentrating 0  Moving slowly or fidgety/restless 0  Suicidal thoughts 0  PHQ-9 Score 0    There were no vitals filed for this visit.  Medications Reviewed Today     Reviewed by Arno Rosaline SQUIBB, RN (Registered Nurse) on  04/19/24 at 1508  Med List Status: <None>   Medication Order Taking? Sig Documenting Provider Last Dose Status Informant  amitriptyline  (ELAVIL ) 10 MG tablet  [id number] Yes Take 1 tablet (10 mg total) by mouth at bedtime.  Patient taking differently: Take 20 mg by mouth at bedtime.   Craig Alan SAUNDERS, PA-C  Active   amLODipine  (NORVASC ) 10 MG tablet [id number]  Take 1 tablet by mouth once daily Cleotilde Perkins, DO  Active   folic acid  (FOLVITE ) 1 MG tablet [id number]  Take 1 tablet by mouth once daily Mansouraty, Aloha Raddle., MD  Active   hydrOXYzine  (ATARAX ) 10 MG tablet [id number]  Take 1 tablet (10 mg total) by mouth 2 (two) times daily. Mansouraty, Aloha Raddle., MD  Active            Med Note (Kemisha Bonnette P   Thu Feb 17, 2024  2:12 PM) Patient taking as needed  hyoscyamine  (LEVSIN  SL) 0.125 MG SL tablet [id number]  Place 1 tablet (0.125 mg total) under the tongue every 6 (six) hours as needed for cramping (spasm of esophagus). Craig Alan SAUNDERS, PA-C  Active   metoCLOPramide  (REGLAN ) 10 MG tablet [id number]  Take 1 tablet (10 mg total) by mouth every 6 (six) hours. Craig Alan SAUNDERS, PA-C  Active   ondansetron  (ZOFRAN -ODT) 4 MG disintegrating tablet [id number]  Take 1 tablet (4 mg total) by mouth every 8 (eight) hours as needed for nausea or vomiting. Ula Prentice SAUNDERS, MD  Active   promethazine  (PHENERGAN ) 25 MG tablet [id number]  Take 1 tablet (25 mg total) by mouth every 6 (six) hours as needed for nausea or vomiting. Craig Alan SAUNDERS, PA-C  Active   spironolactone  (ALDACTONE ) 25 MG tablet [id number]  Take 2 tablets (50 mg total) by mouth at bedtime. Cleotilde Perkins, DO  Active   Vonoprazan Fumarate  (VOQUEZNA ) 20 MG TABS [id number]  Take 20 mg by mouth daily. Dean Clarity, MD  Active            Med Note (Charlene Detter, ROSALINE SHAUNNA Schaumann Feb 17, 2024  2:13 PM) Patient reports they are waiting for medication to be approved            Recommendation:   Specialty provider follow-up Patient will update GI regarding medication dosage adjustments Continue Current Plan of Care  Follow Up Plan:   Telephone follow up appointment date/time:  05/17/24 at 2  PM  ROSALINE Finlay, RN MSN [place]  [facility] RN Care Manager Direct Dial: [phone]  Fax: [phone]

## 2024-04-20 ENCOUNTER — Ambulatory Visit: Admitting: Student

## 2024-04-20 ENCOUNTER — Telehealth: Payer: Self-pay

## 2024-04-20 VITALS — BP 120/74 | HR 107 | Ht 61.0 in | Wt 186.2 lb

## 2024-04-20 DIAGNOSIS — R002 Palpitations: Secondary | ICD-10-CM | POA: Diagnosis present

## 2024-04-20 DIAGNOSIS — E039 Hypothyroidism, unspecified: Secondary | ICD-10-CM

## 2024-04-20 DIAGNOSIS — E041 Nontoxic single thyroid nodule: Secondary | ICD-10-CM

## 2024-04-20 NOTE — Patient Instructions (Signed)
 It was great to see you! Thank you for allowing me to participate in your care!   I recommend that you always bring your medications to each appointment as this makes it easy to ensure we are on the correct medications and helps us  not miss when refills are needed.  Our plans for today:  - We have ordered an echocardiogram. You will receive a call to schedule. - You will receive a call for lab visit to complete your labs, we apologize for the inconvenience today. - We will notify you of your cardiac monitor results once we have them.  Take care and seek immediate care sooner if you develop any concerns. Please remember to show up 15 minutes before your scheduled appointment time!  Gladis Church, DO [facility]

## 2024-04-20 NOTE — Progress Notes (Signed)
    SUBJECTIVE:   CHIEF COMPLAINT / HPI:   Palpitations Patient initially presented to the ED on 6/27-25 with palpitations.  She had a follow-up visit on 03/20/2024 with our clinic.  Found to be hypothyroid, started on Synthroid  (see below) Patient reports that she feels that her heart rate is related to her Synthroid , however I did remind her that her palpitations were starting prior to her Synthroid -and certainly Synthroid  could be worsening what may be an underlying cause. Pregnancy test on 03/22/2024 negative. Zio patch was placed earlier this month, she has sent it off for review.  No results yet.  She does not feel this is anxiety related, no new stressors. Is on amitriptyline  for her stomach condition by GI.  At the current moment she is asymptomatic.   Hypothyroid  thyroid  nodule Had thyroid  ultrasound today, Tri RADS 3, recommend follow-up ultrasound in 1 year.  She was told to stop taking her Synthroid  because of her palpitations, she is not currently taking any.  Original plan was to repeat her TSH, however we are unable to obtain labs today.  OBJECTIVE:   BP 120/74   Pulse (!) 107   Ht 5' 1 (1.549 m)   Wt 186 lb 3.2 oz (84.5 kg)   SpO2 100%   BMI 35.18 kg/m    General: NAD, pleasant Cardio: RRR, no MRG. Cap Refill <2s. Respiratory: CTAB, normal wob on RA Skin: Warm and dry  ASSESSMENT/PLAN:   Assessment & Plan Palpitations Asymptomatic in office, well-appearing. Differential: PVC, PAC, paroxysmal A-fib, other arrhythmia, rule out structural heart disease. - Ordered echocardiogram - Follow-up Zio patch - Consider cardiology referral pending results Hypothyroidism, unspecified type -Worsening palpitations with Synthroid , has since discontinued - Repeat thyroid  tests, future order placed, we will call to schedule lab appointment - I do think a reduced dose of Synthroid  would likely be beneficial in her case, however would defer treatment until workup of her  palpitations is complete. Thyroid  nodule -Recommend repeat ultrasound in 1 year   Gladis Church, DO [facility]

## 2024-04-20 NOTE — Assessment & Plan Note (Signed)
-  Worsening palpitations with Synthroid , has since discontinued - Repeat thyroid  tests, future order placed, we will call to schedule lab appointment - I do think a reduced dose of Synthroid  would likely be beneficial in her case, however would defer treatment until workup of her palpitations is complete.

## 2024-04-20 NOTE — Telephone Encounter (Signed)
 Patient calls nurse line reporting continued heart palpitations.   She reports she was seen on 7/28 for similar symptoms. She reports she has been drinking more fluids, however her symptoms have been persistent.   She reports randomly throughout the day her heart will race and she feels dizzy. She reports no significant event or trauma triggers these episodes. She reports yesterday she watching TV and had to perform deep breathing to calm herself down.  She reports she feels her symptoms are related to changes to Synthroid  medication.   She denies any chest pains or shortness of breath.   Patient scheduled for this afternoon.  Precautions discussed in the meantime.

## 2024-04-21 ENCOUNTER — Telehealth: Payer: Self-pay

## 2024-04-21 NOTE — Telephone Encounter (Signed)
 Called patient per Dr. Dudley request to schedule patient a lab appointment to have TSH drawn and schedule a follow up appointment for PCP.  Was able to schedule lab for 04/24/24 @ 845am Appointment for PCP scheduled 8/19 @150pm   Harlene Reiter, CMA

## 2024-04-24 ENCOUNTER — Other Ambulatory Visit

## 2024-04-24 DIAGNOSIS — E039 Hypothyroidism, unspecified: Secondary | ICD-10-CM | POA: Diagnosis not present

## 2024-04-25 ENCOUNTER — Telehealth: Payer: Self-pay | Admitting: Physician Assistant

## 2024-04-25 DIAGNOSIS — R002 Palpitations: Secondary | ICD-10-CM

## 2024-04-25 LAB — TSH RFX ON ABNORMAL TO FREE T4: TSH: 5.92 u[IU]/mL — ABNORMAL HIGH (ref 0.450–4.500)

## 2024-04-25 LAB — T4F: T4,Free (Direct): 0.81 ng/dL — ABNORMAL LOW (ref 0.82–1.77)

## 2024-04-25 MED ORDER — AMITRIPTYLINE HCL 10 MG PO TABS: 20.0000 mg | ORAL_TABLET | Freq: Every day | ORAL | 1 refills | Status: DC

## 2024-04-25 NOTE — Telephone Encounter (Signed)
 Says that she is doing better with the 20mg . She does 2 10mg  and it has helped with the nausea. She isnt having as much spasms but will get them every now but she would like to continue with the 20mg  and not increase. She has tried to come off the voquezna  due to the [facility] as said but that hasn't helped and she is still on it. Please advise

## 2024-04-25 NOTE — Telephone Encounter (Signed)
 Inbound call from patient requesting a refill for amitriptyline . Please advise, thank you

## 2024-04-25 NOTE — Telephone Encounter (Signed)
**Note De-identified  [place] Obfuscation** Please advise 

## 2024-04-25 NOTE — Telephone Encounter (Signed)
 Thank you :)

## 2024-04-28 ENCOUNTER — Ambulatory Visit: Payer: Self-pay | Admitting: Student

## 2024-05-01 NOTE — Telephone Encounter (Signed)
 Called to discuss TSH results which look improved. She is currently not experiencing any symptoms of hypothyroidism.   Recommend to continue lab surveillance every 3-6 months and instructed patient to call if she begins to not feel well.   Damien Pinal, DO [facility], PGY-3 05/01/24 7:13 PM

## 2024-05-05 ENCOUNTER — Ambulatory Visit (INDEPENDENT_AMBULATORY_CARE_PROVIDER_SITE_OTHER): Admitting: Family Medicine

## 2024-05-05 ENCOUNTER — Other Ambulatory Visit (HOSPITAL_COMMUNITY)
Admission: RE | Admit: 2024-05-05 | Discharge: 2024-05-05 | Disposition: A | Discharge status: Home / Self Care | Source: Ambulatory Visit | Attending: Family Medicine | Admitting: Family Medicine

## 2024-05-05 VITALS — BP 122/88 | HR 107 | Ht 61.0 in | Wt 189.8 lb

## 2024-05-05 DIAGNOSIS — B9689 Other specified bacterial agents as the cause of diseases classified elsewhere: Secondary | ICD-10-CM

## 2024-05-05 DIAGNOSIS — R300 Dysuria: Secondary | ICD-10-CM

## 2024-05-05 DIAGNOSIS — N760 Acute vaginitis: Secondary | ICD-10-CM | POA: Diagnosis present

## 2024-05-05 LAB — POCT WET PREP (WET MOUNT)
Clue Cells Wet Prep Whiff POC: POSITIVE
Trichomonas Wet Prep HPF POC: ABSENT

## 2024-05-05 MED ORDER — METRONIDAZOLE 0.75 % VA GEL: 1.0000 | Freq: Every day | VAGINAL | 0 refills | Status: DC

## 2024-05-05 MED ORDER — METRONIDAZOLE 500 MG PO TABS: 500.0000 mg | ORAL_TABLET | Freq: Two times a day (BID) | ORAL | 0 refills | Status: DC

## 2024-05-05 NOTE — Progress Notes (Signed)
    SUBJECTIVE:   CHIEF COMPLAINT / HPI:   Vaginal Itching Patient endorses using a new soap and took a bath with this and has noticed vaginal itching and some burning since.  She believes that this soap may have had more fragrances than her usual.  Has not been actively itching, does not anticipate any excoriations.  Last menstrual period last week and last sexual encounter on Sunday.  Has an IUD for contraception.  Burning w/ Peeing She endorses some increased burning with urination, urgency, and frequency.  PERTINENT  PMH / PSH: Anxiety, Gastritis, Cholelithiasis  OBJECTIVE:   BP 122/88   Pulse (!) 107   Ht 5' 1 (1.549 m)   Wt 189 lb 12.8 oz (86.1 kg)   SpO2 100%   BMI 35.86 kg/m   General: Awake and Alert in NAD HEENT: NCAT. Sclera anicteric. No rhinorrhea. Cardiovascular: RRR. No M/R/G Respiratory: CTAB, normal WOB on RA. No wheezing, crackles, rhonchi, or diminished breath sounds. Abdomen: Soft, non-tender, non-distended. Bowel sounds normoactive Extremities: Able to move all extremities. No BLE edema, no deformities or significant joint findings. Skin: Warm and dry. No abrasions or rashes noted. Neuro: A&Ox3. No focal neurological deficits. F GU: Normally developed genitalia with no external lesions or eruptions. Vagina and cervix show no lesions, inflammation. Mild discharge with odor. Chaperoned by CMA Tashira Leggette.   ASSESSMENT/PLAN:   Assessment & Plan Bacterial vaginosis Likely secondary to changing her soap. - Wet prep for BV/yeast/trich which demonstrated BV, prescribed Metrogel  vaginally nightly for 5 days d/t nausea with Metronidazole  tablets - Cervicovaginal ancillary for STD testing - Advised patient to keep the area clean and avoid moisture as able.  Avoid soaps or irritants in the area Dysuria UTI symptoms present will confirm with a UA.  Patient was unable to void.  Will treat for BV and see if symptoms persist.  Kathrine Melena, DO 05/05/24 3:51  PM

## 2024-05-05 NOTE — Patient Instructions (Addendum)
 It was great to see you today! Thank you for choosing [facility] for your primary care. Tamara Church was seen for vaginal itching.  Today we addressed: Vaginal itching/burning with peeing - urinalysis and wet prep/STD testing done to rule out other causes You tested positive for BV, will treat with metrogel  vaginally nightly for 5 days Make sure to keep the area clean and avoid moisture as able.  Avoid soaps or irritants in the area.  STD Prevention and Safe Sex Practices - Important to note: - STDs can occur without symptoms - Pulling out is not an effective form of protection from pregnancy or STDs - Oral sex can transmit STDs too - Abstinence or being in a long-term, mutually monogamous relationship with an uninfected partner is the most reliable way to prevent STDs - Correct and consistent use of condoms can significantly reduce the risk of STDs - Limiting the number of sex partners and modifying other risky behaviors can decrease the risk of STDs - HPV vaccination is recommended for females up to age 56 and males up to age 66 (29 for men who have sex with men) - HIV testing is offered to anyone diagnosed with an STD, and pre-exposure prophylaxis may be appropriate for sexually active individuals. Post exposure prophylaxis is recommended within 72 hours of potential exposure - Please notify sexual partners if diagnosed with an STD. Expedited Partner Therapy (EPT) may be available for some infections  We are checking some labs today. If they are abnormal, I will call you. If they are normal, I will send you a MyChart message (if it is active) or a letter in the mail. If you do not hear about your labs in the next 2 weeks, please call the office.  You should return to our clinic Return if symptoms worsen or fail to improve. Please arrive 15 minutes before your appointment to ensure smooth check in process.  We appreciate your efforts in making this happen.  Thank you for  allowing me to participate in your care, Kathrine Melena, DO 05/05/2024, 3:49 PM PGY-2, [facility]

## 2024-05-07 ENCOUNTER — Other Ambulatory Visit: Payer: Self-pay | Admitting: Student

## 2024-05-08 ENCOUNTER — Ambulatory Visit: Payer: Self-pay | Admitting: Family Medicine

## 2024-05-08 LAB — CERVICOVAGINAL ANCILLARY ONLY
Bacterial Vaginitis (gardnerella): POSITIVE — AB
Candida Glabrata: NEGATIVE
Candida Vaginitis: NEGATIVE
Chlamydia: NEGATIVE
Comment: NEGATIVE
Comment: NEGATIVE
Comment: NEGATIVE
Comment: NEGATIVE
Comment: NEGATIVE
Comment: NORMAL
Neisseria Gonorrhea: NEGATIVE
Trichomonas: NEGATIVE

## 2024-05-09 ENCOUNTER — Ambulatory Visit: Admitting: Student

## 2024-05-10 ENCOUNTER — Telehealth: Payer: Self-pay | Admitting: Physician Assistant

## 2024-05-10 DIAGNOSIS — R110 Nausea: Secondary | ICD-10-CM

## 2024-05-10 DIAGNOSIS — K2100 Gastro-esophageal reflux disease with esophagitis, without bleeding: Secondary | ICD-10-CM

## 2024-05-10 DIAGNOSIS — E876 Hypokalemia: Secondary | ICD-10-CM

## 2024-05-10 DIAGNOSIS — K2970 Gastritis, unspecified, without bleeding: Secondary | ICD-10-CM

## 2024-05-10 DIAGNOSIS — R1013 Epigastric pain: Secondary | ICD-10-CM

## 2024-05-10 NOTE — Telephone Encounter (Signed)
 Please advise we didn't fill her zofran 

## 2024-05-10 NOTE — Telephone Encounter (Signed)
 Patient called and stated that she was wondering if she needs to come off her Zofran  for her acid refill or does she need to continue taking it. Please advise.

## 2024-05-11 MED ORDER — FOLIC ACID 1 MG PO TABS: 1.0000 mg | ORAL_TABLET | Freq: Every day | ORAL | 1 refills | Status: DC

## 2024-05-11 NOTE — Telephone Encounter (Signed)
 Refill for folic acid  sent to pharmacy. Follow-up appointment made with Dr Wilhelmenia for 06/20/24 at 10:30 am. Patient has been informed.

## 2024-05-11 NOTE — Telephone Encounter (Signed)
 Patient said she called about her folic acid  medication not having any refills and nothing about her Zofran . She doesn't have refills for her folic acid  and wants to know if she needs to stay on the medication or not. Please advise

## 2024-05-16 ENCOUNTER — Telehealth: Payer: Self-pay

## 2024-05-16 DIAGNOSIS — B9689 Other specified bacterial agents as the cause of diseases classified elsewhere: Secondary | ICD-10-CM

## 2024-05-16 MED ORDER — METRONIDAZOLE 500 MG PO TABS: 500.0000 mg | ORAL_TABLET | Freq: Two times a day (BID) | ORAL | 0 refills | Status: AC

## 2024-05-16 NOTE — Telephone Encounter (Signed)
 Patient calls nurse line due to continued BV symptoms.   She reports that she completed treatment with Metrogel , however, feels that infection did not fully go away.   She reports continued vaginal irritation. Denies odor. Noticed discharge over the weekend, this has now resolved.   She is asking if provider can send in oral medication.   Discussed documented nausea to previous oral treatment. Patient reports that she will deal with nausea if she has to.   Will forward to provider for further advisement.   Chiquita JAYSON English, RN

## 2024-05-17 ENCOUNTER — Ambulatory Visit: Payer: Self-pay | Admitting: Physician Assistant

## 2024-05-17 ENCOUNTER — Other Ambulatory Visit: Payer: Self-pay

## 2024-05-17 ENCOUNTER — Ambulatory Visit (HOSPITAL_COMMUNITY)
Admission: RE | Admit: 2024-05-17 | Discharge: 2024-05-17 | Disposition: A | Discharge status: Home / Self Care | Source: Ambulatory Visit | Attending: Family Medicine | Admitting: Family Medicine

## 2024-05-17 DIAGNOSIS — I10 Essential (primary) hypertension: Secondary | ICD-10-CM | POA: Diagnosis not present

## 2024-05-17 DIAGNOSIS — R002 Palpitations: Secondary | ICD-10-CM | POA: Insufficient documentation

## 2024-05-17 DIAGNOSIS — I517 Cardiomegaly: Secondary | ICD-10-CM | POA: Diagnosis not present

## 2024-05-17 LAB — ECHOCARDIOGRAM COMPLETE
Area-P 1/2: 3.74 cm2
S' Lateral: 3 cm

## 2024-05-17 NOTE — Patient Instructions (Signed)
 Visit Information  Tamara Church was given information about Medicaid Managed Care team care coordination services as a part of their [facility] Community Plan Medicaid benefit.   If you would like to schedule transportation through your [facility], please call the following number at least 2 days in advance of your appointment: [phone]   Rides for urgent appointments can also be made after hours by calling Member Services.  Call the Behavioral Health Crisis Line at [phone], at any time, 24 hours a day, 7 days a week. If you are in danger or need immediate medical attention call 911.   Tamara Church - following are the goals we discussed in your visit today:   Goals Addressed             This Visit's Progress    VBCI RN Care Plan   On track    Problems:  Chronic Disease Management support and education needs related to GERD and HTN  Goal: Over the next 30 days the Patient will demonstrate Ongoing adherence to prescribed treatment plan for GERD as evidenced by patient report of decreased symptoms of nausea and feeling like food is getting stuck, no hospitalizations or ED visits per chart review take all medications exactly as prescribed and will call provider for medication related questions as evidenced by patient report of medication compliance    work with PCP to review zio patch and ECHO results as evidenced by review of electronic medical record and patient or care team member report   Obtain BP monitor for home monitoring  Interventions:   Evaluation of current treatment plan related to GERD and HTN self-management and patient's adherence to plan as established by provider. Discussed plans with patient for ongoing care management follow up and provided patient with direct contact information for care management team Evaluation of current treatment plan related to GERD and patient's adherence to plan as established by provider Reviewed medications  with patient and discussed medication compliance as advised by GI Message sent to PCP requesting an order for a BP monitor and to make aware that ECHO has been completed. Noted in message that no follow-up is scheduled at this time  Patient Self-Care Activities:  Attend all scheduled provider appointments Call provider office for new concerns or questions  Take medications as prescribed   take medications for blood pressure exactly as prescribed Obtain BP cuff and begin checking at least 3 times a week  Plan:  Telephone follow up appointment with care management team member scheduled for:  06/14/24 at 1:30 PM             The patient verbalized understanding of instructions, educational materials, and care plan provided today and DECLINED offer to receive copy of patient instructions, educational materials, and care plan.   RN Care Manager will send PCP a message requesting an order for a BP monitor Telephone follow up appointment with Managed Medicaid care management team member scheduled for: 06/14/24 at 1:30 PM  Tamara Finlay, RN MSN [place]  [facility]BCI Population Health RN Care Manager Direct Dial: [phone]  Fax: [phone]   Following is a copy of your plan of care:  There are no care plans that you recently modified to display for this patient.

## 2024-05-17 NOTE — Patient Outreach (Signed)
 Complex Care Management   Visit Note  05/17/2024  Name:  Tamara Church MRN: [id number] DOB: 1976/07/08  Situation: Referral received for Complex Care Management related to GERD and HTN I obtained verbal consent from Patient.  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   Gastritis and gastroduodenitis 07/27/2020    Assessment: Patient Reported Symptoms:  Cognitive Cognitive Status: Able to follow simple commands, Alert and oriented to person, place, and time, Normal speech and language skills Cognitive/Intellectual Conditions Management [RPT]: None reported or documented in medical history or problem list      Neurological Neurological Review of Symptoms: No symptoms reported    HEENT HEENT Symptoms Reported: No symptoms reported      Cardiovascular Cardiovascular Symptoms Reported: No symptoms reported Does patient have uncontrolled Hypertension?: No Patient's Recent BP reading at home: Paitent notes she has not purchased BP cuff. Message sent to PCP requesting an order Cardiovascular Management Strategies: Medication therapy, Routine screening Cardiovascular Comment: Patient notes she has not had any further episodes of palpitations since stopping Synthroid . She is still waiting on results from Zio Patch that was ordered by PCP. She reports she had ECHO completed today 05/17/24. Message sent to PCP to make aware that test was completed, as no follow-up is scheduled per chart review at this time.  Respiratory Respiratory Symptoms Reported: No symptoms reported    Endocrine Endocrine Symptoms Reported: No symptoms reported Is patient diabetic?: No Endocrine Comment: Patient has repeat thyroid  labs scheduled next week.  Gastrointestinal Gastrointestinal Symptoms Reported: No symptoms  reported Additional Gastrointestinal Details: Patient reports Amitriptyline  has helped a lot with esophageal mobility. She has gotten nauseated 1-2 times but has not required hospitalization or ED visit, able to manage at home. She reports she is able to keep food down and has actually gained a couple of pounds back. She reports having regular BMs. Next f/u with GI 06/20/24. Gastrointestinal Management Strategies: Diet modification, Medication therapy    Genitourinary Genitourinary Symptoms Reported: No symptoms reported    Integumentary Integumentary Symptoms Reported: Itching Additional Integumentary Details: Patient started PO abx yesterday for continued bacterial vaginosis symptoms Skin Management Strategies: Medication therapy  Musculoskeletal Musculoskelatal Symptoms Reviewed: No symptoms reported   Falls in the past year?: No Number of falls in past year: 1 or less Was there an injury with Fall?: No Fall Risk Category Calculator: 0 Patient Fall Risk Level: Low Fall Risk Patient at Risk for Falls Due to: No Fall Risks Fall risk Follow up: Falls evaluation completed  Psychosocial Psychosocial Symptoms Reported: No symptoms reported          05/17/2024    PHQ2-9 Depression Screening   Little interest or pleasure in doing things    Feeling down, depressed, or hopeless    PHQ-2 - Total Score    Trouble falling or staying asleep, or sleeping too much    Feeling tired or having little energy    Poor appetite or overeating     Feeling bad about yourself - or that you are a failure or have let yourself or your family down    Trouble concentrating on things, such as reading the newspaper or watching television    Moving or speaking so slowly that other people could have noticed.  Or the opposite - being so fidgety or restless that you  have been moving around a lot more than usual    Thoughts that you would be better off dead, or hurting yourself in some way    PHQ2-9 Total Score    If  you checked off any problems, how difficult have these problems made it for you to do your work, take care of things at home, or get along with other people    Depression Interventions/Treatment      There were no vitals filed for this visit.  Medications Reviewed Today     Reviewed by Arno Rosaline SQUIBB, RN (Registered Nurse) on 05/17/24 at 1407  Med List Status: <None>   Medication Order Taking? Sig Documenting Provider Last Dose Status Informant  amitriptyline  (ELAVIL ) 10 MG tablet [id number]  Take 2 tablets (20 mg total) by mouth at bedtime. Craig Alan SAUNDERS, PA-C  Active   amLODipine  (NORVASC ) 10 MG tablet [id number]  Take 1 tablet by mouth once daily Cleotilde Perkins, DO  Active   folic acid  (FOLVITE ) 1 MG tablet [id number]  Take 1 tablet (1 mg total) by mouth daily. Mansouraty, Aloha Raddle., MD  Active   hydrOXYzine  (ATARAX ) 10 MG tablet [id number]  Take 1 tablet (10 mg total) by mouth 2 (two) times daily. Mansouraty, Aloha Raddle., MD  Active            Med Note (Khiana Camino P   Thu Feb 17, 2024  2:12 PM) Patient taking as needed  hyoscyamine  (LEVSIN  SL) 0.125 MG SL tablet [id number]  Place 1 tablet (0.125 mg total) under the tongue every 6 (six) hours as needed for cramping (spasm of esophagus). Craig Alan SAUNDERS, PA-C  Active   metoCLOPramide  (REGLAN ) 10 MG tablet [id number]  Take 1 tablet (10 mg total) by mouth every 6 (six) hours. Craig Alan SAUNDERS, PA-C  Active   metroNIDAZOLE  (FLAGYL ) 500 MG tablet [id number]  Take 1 tablet (500 mg total) by mouth 2 (two) times daily for 7 days. Gomes, Adriana, DO  Active   metroNIDAZOLE  (METROGEL ) 0.75 % vaginal gel [id number]  Place 1 Applicatorful vaginally at bedtime. For 5 days. Gomes, Adriana, DO  Active   ondansetron  (ZOFRAN -ODT) 4 MG disintegrating tablet [id number]  Take 1 tablet (4 mg total) by mouth every 8 (eight) hours as needed for nausea or vomiting. Ula Prentice SAUNDERS, MD  Active   promethazine  (PHENERGAN ) 25 MG tablet [id number]  Take 1  tablet (25 mg total) by mouth every 6 (six) hours as needed for nausea or vomiting. Craig Alan SAUNDERS, PA-C  Active   spironolactone  (ALDACTONE ) 25 MG tablet [id number]  Take 2 tablets (50 mg total) by mouth at bedtime. Cleotilde Perkins, DO  Active   Vonoprazan Fumarate  (VOQUEZNA ) 20 MG TABS [id number]  Take 20 mg by mouth daily. Dean Clarity, MD  Active            Med Note (Kenni Newton, ROSALINE SQUIBB Schaumann Feb 17, 2024  2:13 PM) Patient reports they are waiting for medication to be approved            Recommendation:   PCP Follow-up Continue Current Plan of Care  Follow Up Plan:   Telephone follow up appointment date/time:  06/14/24 at 1:30 PM  Rosaline Arno, RN MSN [place]  [facility] RN Care Manager Direct Dial: [phone]  Fax: [phone]

## 2024-05-18 ENCOUNTER — Ambulatory Visit: Payer: Self-pay | Admitting: Student

## 2024-05-18 DIAGNOSIS — R002 Palpitations: Secondary | ICD-10-CM

## 2024-05-18 DIAGNOSIS — I517 Cardiomegaly: Secondary | ICD-10-CM

## 2024-05-18 NOTE — Telephone Encounter (Signed)
 Called patient, confirmed identity.  Discussed echocardiogram results.  Likely a result of her high blood pressure, but given her palpitations, will send to cardiology to ensure this is not concerning for HCM.  Patient expressed understanding agreement plan.

## 2024-05-19 ENCOUNTER — Encounter (HOSPITAL_COMMUNITY)

## 2024-05-19 ENCOUNTER — Encounter: Admitting: Vascular Surgery

## 2024-05-23 ENCOUNTER — Other Ambulatory Visit: Payer: Self-pay

## 2024-05-25 ENCOUNTER — Other Ambulatory Visit

## 2024-05-25 DIAGNOSIS — R7989 Other specified abnormal findings of blood chemistry: Secondary | ICD-10-CM | POA: Diagnosis not present

## 2024-05-25 DIAGNOSIS — E039 Hypothyroidism, unspecified: Secondary | ICD-10-CM

## 2024-05-26 ENCOUNTER — Ambulatory Visit: Payer: Self-pay | Admitting: Family Medicine

## 2024-05-26 LAB — TSH RFX ON ABNORMAL TO FREE T4: TSH: 5.08 u[IU]/mL — ABNORMAL HIGH (ref 0.450–4.500)

## 2024-05-26 LAB — T4F: T4,Free (Direct): 0.8 ng/dL — ABNORMAL LOW (ref 0.82–1.77)

## 2024-05-29 ENCOUNTER — Ambulatory Visit: Admitting: Student

## 2024-05-29 VITALS — BP 112/84 | HR 110 | Ht 62.0 in | Wt 196.0 lb

## 2024-05-29 DIAGNOSIS — N9089 Other specified noninflammatory disorders of vulva and perineum: Secondary | ICD-10-CM

## 2024-05-29 MED ORDER — FLUCONAZOLE 150 MG PO TABS: 150.0000 mg | ORAL_TABLET | Freq: Once | ORAL | 0 refills | Status: AC

## 2024-05-29 MED ORDER — HYDROCORTISONE 0.5 % EX CREA: 1.0000 | TOPICAL_CREAM | Freq: Two times a day (BID) | CUTANEOUS | 0 refills | Status: AC

## 2024-05-29 NOTE — Patient Instructions (Signed)
 It was great to see you today!   I have sent a pill for a yeast infection to your pharmacy  I have also prescribed a steroid cream you can use twice a day to calm down the inflammation. Use this for less than 5 days   Future Appointments  Date Time Provider Department Center  06/14/2024  1:30 PM Arno Rosaline SQUIBB, RN [facility] None  06/19/2024  2:10 PM Leftwich-Kirby, Olam LABOR, CNM [facility] None  06/20/2024 10:30 AM Mansouraty, Aloha Raddle., MD [facility]    Please arrive 15 minutes before your appointment to ensure smooth check in process.    Please call the clinic at [phone] if your symptoms worsen or you have any concerns.  Thank you for allowing me to participate in your care, Dr. Damien Pinal [facility]

## 2024-05-29 NOTE — Progress Notes (Signed)
    SUBJECTIVE:   CHIEF COMPLAINT / HPI:   Tamara Church is a 48 y.o. female presenting for vulvar irritation. Patient reports she was worked up for vaginal discharge and pain and found to have BV she was treated with Metronidazole  suppositories initially without improvement she then completed a course of oral antibiotics without improvement and subsequent worsening of her symptoms. She has burring with urination, itching and pain.   She is having regular periods. Denies new sexual partners.   PERTINENT  PMH / PSH: reviewed and updated.  OBJECTIVE:   BP 112/84   Pulse (!) 110   Ht 5' 2 (1.575 m)   Wt 196 lb (88.9 kg)   SpO2 100%   BMI 35.85 kg/m   Well-appearing, no acute distress Cardio: Regular rate, regular rhythm, no murmurs on exam. Pulm: Clear, no wheezing, no crackles. No increased work of breathing  Pelvic Exam: MA chaperone present  Normal external genitalia with erythema surrounding urethra  No abnormal discharge   ASSESSMENT/PLAN:   Assessment & Plan Vulvar irritation External exam consistent with yeast infection will empirically treat with fluconazole  tablet  Prescribe hydrocortisone  cream to be use BID for inflammation, for short course     Damien Pinal, DO [facility]

## 2024-05-30 ENCOUNTER — Telehealth: Payer: Self-pay

## 2024-05-30 DIAGNOSIS — N898 Other specified noninflammatory disorders of vagina: Secondary | ICD-10-CM

## 2024-05-30 NOTE — Telephone Encounter (Signed)
 Patient calls nurse line regarding continued vaginal irritation and discharge. She reports taking 1 tablet of fluconazole  yesterday after visit, however, she is still experiencing a great deal of irritation/discomfort.   Reports thick, white discharge.   She is asking if there is another medication that she could take or additional fluconazole  prescription for continued symptoms.   Chiquita JAYSON English, RN

## 2024-05-31 MED ORDER — FLUCONAZOLE 150 MG PO TABS: 150.0000 mg | ORAL_TABLET | Freq: Once | ORAL | 0 refills | Status: AC

## 2024-06-01 NOTE — Telephone Encounter (Signed)
 Patient returns call to nurse line. Advised of update. Patient will call back if she does not have improvements.   Chiquita JAYSON English, RN

## 2024-06-01 NOTE — Telephone Encounter (Signed)
 Called patient. Call went immediately to VM. LVM requesting that patient return call to office for update.   Chiquita JAYSON English, RN

## 2024-06-14 ENCOUNTER — Telehealth: Payer: Self-pay

## 2024-06-14 NOTE — Patient Instructions (Signed)
 Renea LITTIE Slack - I am sorry I was unable to reach you today for our scheduled appointment. I work with Cleotilde Perkins, DO and am calling to support your healthcare needs. Please contact me at [phone] at your earliest convenience. I look forward to speaking with you soon.   Thank you,  Rosaline Finlay, RN MSN [place]  [facility] RN Care Manager Direct Dial: [phone]  Fax: [phone]

## 2024-06-14 NOTE — Patient Outreach (Signed)
 Care Coordination   06/14/2024 Name: BRANDE UNCAPHER MRN: [id number] DOB: 1976/06/29   Care Coordination Outreach Attempts:  An unsuccessful outreach was attempted for an appointment today.  Follow Up Plan:  Additional outreach attempts will be made to complete CCM follow-up visit.   Encounter Outcome:  Patient reports she is at work and unable to take call at scheduled appointment time. She requests that [facility] call back later this afternoon. Attempted x2 to call patient at later time. No answer. Unable to leave voicemail due to voicemail box full.    Rosaline Finlay, RN MSN   VBCI Population Health RN Care Manager Direct Dial: [phone]  Fax: [phone]

## 2024-06-19 ENCOUNTER — Encounter: Payer: Self-pay | Admitting: Advanced Practice Midwife

## 2024-06-19 ENCOUNTER — Ambulatory Visit (INDEPENDENT_AMBULATORY_CARE_PROVIDER_SITE_OTHER): Admitting: Advanced Practice Midwife

## 2024-06-19 ENCOUNTER — Other Ambulatory Visit: Payer: Self-pay

## 2024-06-19 VITALS — BP 131/85 | HR 90 | Ht 61.0 in | Wt 203.0 lb

## 2024-06-19 DIAGNOSIS — G8929 Other chronic pain: Secondary | ICD-10-CM | POA: Diagnosis not present

## 2024-06-19 DIAGNOSIS — R109 Unspecified abdominal pain: Secondary | ICD-10-CM | POA: Diagnosis not present

## 2024-06-19 DIAGNOSIS — N840 Polyp of corpus uteri: Secondary | ICD-10-CM | POA: Diagnosis not present

## 2024-06-19 NOTE — Progress Notes (Signed)
 Pt referred for endometrial polyp.  Last PAP 04-27-23

## 2024-06-19 NOTE — Progress Notes (Signed)
   GYNECOLOGY PROGRESS NOTE  History:  48 y.o. [id number] presents to [facility] office today for problem gyn visit. She reports she was sent to OB/Gyn from her PCP for a polyp seen on CT scan and ultrasound in July of 2025. She had ultrasound for chronic abdominal pain and n/v.   She reports irregular light menses but denies any uterine cramping or heavy periods.    She denies h/a, dizziness, shortness of breath, n/v, or fever/chills.    The following portions of the patient's history were reviewed and updated as appropriate: allergies, current medications, past family history, past medical history, past social history, past surgical history and problem list. Last pap smear on 04/27/2023 was normal, negative HRHPV.  Health Maintenance Due  Topic Date Due   Hepatitis B Vaccines 19-59 Average Risk (1 of 3 - 19+ 3-dose series) Never done   Mammogram  Never done   Influenza Vaccine  Never done   COVID-19 Vaccine (1 - 2024-25 season) Never done     Review of Systems:  Pertinent items are noted in HPI.   Objective:  Physical Exam Blood pressure 131/85, pulse 90, height 5' 1 (1.549 m), weight 203 lb (92.1 kg). VS reviewed, nursing note reviewed,  Constitutional: well developed, well nourished, no distress HEENT: normocephalic CV: normal rate Pulm/chest wall: normal effort Breast Exam: deferred Abdomen: soft Neuro: alert and oriented x 3 Skin: warm, dry Psych: affect normal Pelvic exam: Deferred  Assessment & Plan:  1. Endometrial polyp (Primary) --sonohysterogram recommended, message sent to schedule at [facility] with Dr Cleotilde.  --Pt stable, no emergent concerns today --Pap up to date - US  Sonohysterogram; Future  2. Chronic abdominal pain     No follow-ups on file.   Tamara Church, CNM 2:45 PM

## 2024-06-19 NOTE — Patient Outreach (Signed)
 Care Coordination   06/19/2024 Name: SHEMECA LUKASIK MRN: [id number] DOB: 1975-10-06   Care Coordination Outreach Attempts:  A second unsuccessful outreach was attempted today to complete CCM follow-up visit.   Follow Up Plan:  Additional outreach attempts will be made to complete follow-up visit.   Encounter Outcome:  No Answer. Mailbox fulll, unable to leave voicemail   Rosaline Finlay, RN MSN [place]  [facility] RN Care Manager Direct Dial: [phone]  Fax: [phone]

## 2024-06-20 ENCOUNTER — Ambulatory Visit: Admitting: Gastroenterology

## 2024-06-20 ENCOUNTER — Encounter: Payer: Self-pay | Admitting: Gastroenterology

## 2024-06-20 VITALS — BP 130/70 | HR 110 | Ht 61.0 in | Wt 205.2 lb

## 2024-06-20 DIAGNOSIS — K2100 Gastro-esophageal reflux disease with esophagitis, without bleeding: Secondary | ICD-10-CM | POA: Diagnosis not present

## 2024-06-20 DIAGNOSIS — K224 Dyskinesia of esophagus: Secondary | ICD-10-CM

## 2024-06-20 DIAGNOSIS — R1114 Bilious vomiting: Secondary | ICD-10-CM

## 2024-06-20 DIAGNOSIS — Z1211 Encounter for screening for malignant neoplasm of colon: Secondary | ICD-10-CM

## 2024-06-20 MED ORDER — VOQUEZNA 20 MG PO TABS: 20.0000 mg | ORAL_TABLET | Freq: Every day | ORAL | 3 refills | Status: DC

## 2024-06-20 MED ORDER — AMITRIPTYLINE HCL 10 MG PO TABS: 20.0000 mg | ORAL_TABLET | Freq: Every day | ORAL | 1 refills | Status: AC

## 2024-06-20 NOTE — Progress Notes (Unsigned)
 GASTROENTEROLOGY OUTPATIENT CLINIC VISIT   Primary Care Provider Cleotilde Perkins, DO [address] [place] [address] [phone]  Patient Profile: Tamara Church is a 48 y.o. female with a pmh significant for hypertension, anemia, Anxiety, obesity, nephrolithiasis, cholelithiasis (s/p CCK), hiatal hernia, GERD (microscopic esophagitis noted on biopsies previously), recurrent Abdominal Pain/Nausea/Vomiting Episodes.  The patient presents to the [facility] for an evaluation and management of problem(s) noted below:  Problem List 1. Ineffective esophageal motility   2. Gastroesophageal reflux disease with esophagitis without hemorrhage   3. Bilious vomiting with nausea   4. Colon cancer screening    Discussed the use of AI scribe software for clinical note transcription with the patient, who gave verbal consent to proceed.  History of Present Illness Please see prior progress notes for full details of HPI.  Interval History Tamara Church is a 48 year old female who presents for follow-up.  She reports taking Voquenza and amitriptyline  as part of her current treatment. She feels 'excellent' with her current treatment and has not experienced any significant issues since July. She is currently taking 20 mg of amitriptyline  at bedtime, which was increased from an initial lower dose, and takes Voquenza once daily.  Her swallowing has been 'working pretty good' and she has not required hospitalization since July, when she had an episode of difficulty digesting food. Since starting amitriptyline , she has noticed improvement in her symptoms, including better sleep at night.  She has been seen by [facility] Esophageal clinic and not felt to be a candidate for [facility] repair or fundoplication and that IEM was more likely an etiology for her symptoms.   GI Review of Systems Positive as above Negative for bloating, melena, hematochezia  Review of Systems General: Denies  fevers/chills/unintentional weight loss Cardiovascular: Denies chest pain Pulmonary: Denies shortness of breath Gastroenterological: See HPI Genitourinary: Denies darkened urine Hematological: Denies easy bruising/bleeding Dermatological: Denies jaundice Psychological: Mood is stable   Medications Current Outpatient Medications  Medication Sig Dispense Refill   amLODipine  (NORVASC ) 10 MG tablet Take 1 tablet by mouth once daily 90 tablet 0   folic acid  (FOLVITE ) 1 MG tablet Take 1 tablet (1 mg total) by mouth daily. 30 tablet 1   spironolactone  (ALDACTONE ) 25 MG tablet Take 2 tablets (50 mg total) by mouth at bedtime. 180 tablet 3   amitriptyline  (ELAVIL ) 10 MG tablet Take 2 tablets (20 mg total) by mouth at bedtime. 180 tablet 1   Vonoprazan Fumarate  (VOQUEZNA ) 20 MG TABS Take 20 mg by mouth daily. 30 tablet 3   No current facility-administered medications for this visit.    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   Gastritis and gastroduodenitis 07/27/2020   Past Surgical History:  Procedure Laterality Date   16 HOUR PH STUDY N/A 08/25/2023   Procedure: 24 HOUR PH STUDY;  Surgeon: Wilhelmenia Aloha Raddle., MD;  Location: [facility];  Service: Gastroenterology;  Laterality: N/A;   BIOPSY  05/26/2023   Procedure: BIOPSY;  Surgeon: Wilhelmenia Aloha Raddle., MD;  Location: [facility];  Service: Gastroenterology;;   CHOLECYSTECTOMY  09/2022   COLONOSCOPY  05/2023   ESOPHAGEAL MANOMETRY N/A 08/25/2023   Procedure:  ESOPHAGEAL MANOMETRY (EM);  Surgeon: Mansouraty, Aloha Raddle., MD;  Location: [facility];  Service: Gastroenterology;  Laterality: N/A;    ESOPHAGOGASTRODUODENOSCOPY (EGD) WITH PROPOFOL  N/A 05/26/2023   Procedure: ESOPHAGOGASTRODUODENOSCOPY (EGD) WITH PROPOFOL ;  Surgeon: Wilhelmenia Aloha Raddle., MD;  Location: [facility];  Service: Gastroenterology;  Laterality: N/A;   NO PAST SURGERIES     UPPER GASTROINTESTINAL ENDOSCOPY     Social History   Socioeconomic History   Marital status: Married    Spouse name: Not on file   Number of children: 6   Years of education: Not on file   Highest education level: Not on file  Occupational History   Not on file  Tobacco Use   Smoking status: Never   Smokeless tobacco: Never  Vaping Use   Vaping status: Never Used  Substance and Sexual Activity   Alcohol use: No   Drug use: No   Sexual activity: Yes    Birth control/protection: I.U.D.  Other Topics Concern   Not on file  Social History Narrative   Not on file   Social Drivers of Health   Financial Resource Strain: High Risk (02/29/2024)   Overall Financial Resource Strain (CARDIA)    Difficulty of Paying Living Expenses: Hard  Food Insecurity: No Food Insecurity (05/17/2024)   Hunger Vital Sign    Worried About Running Out of Food in the Last Year: Never true    Ran Out of Food in the Last Year: Never true  Transportation Needs: No Transportation Needs (05/17/2024)   PRAPARE - Administrator, Civil Service (Medical): No    Lack of Transportation (Non-Medical): No  Physical Activity: Not on file  Stress: Not on file  Social Connections: Not on file  Intimate Partner Violence: Not At Risk (02/17/2024)   Humiliation, Afraid, Rape, and Kick questionnaire    Fear of Current or Ex-Partner: No    Emotionally Abused: No    Physically Abused: No    Sexually Abused: No   Family History  Problem Relation Age of Onset   Hypertension Mother    Colon cancer Neg Hx    Esophageal cancer Neg Hx    Rectal cancer Neg Hx    Stomach cancer Neg Hx    Inflammatory bowel disease Neg Hx    Liver disease Neg Hx     Pancreatic cancer Neg Hx    I have reviewed her medical, social, and family history in detail and updated the electronic medical record as necessary.    PHYSICAL EXAMINATION  BP 130/70   Pulse (!) 110   Ht 5' 1 (1.549 m)   Wt 205 lb 4 oz (93.1 kg)   BMI 38.78 kg/m  Wt Readings from Last 3 Encounters:  06/20/24 205 lb 4 oz (93.1 kg)  06/19/24 203 lb (92.1 kg)  05/29/24 196 lb (88.9 kg)  GEN: NAD, appears stated age, doesn't appear chronically ill PSYCH: Cooperative, without pressured speech EYE: Conjunctivae pink, sclerae anicteric ENT: MMM CV: Nontachycardic  RESP: No audible wheezing GI: NABS, soft, rounded, protuberant, nontender, without rebound  MSK/EXT: No significant lower extremity edema SKIN: No jaundice NEURO:  Alert & Oriented x 3, no focal deficits   REVIEW OF DATA  I reviewed the following data at the time of this encounter:  GI Procedures and Studies  No new studies to review  Laboratory Studies  Reviewed those in [facility]  Imaging Studies  June 2025 CTAP IMPRESSION: 1. No evidence of pulmonary arterial dilatation or embolus at least  out to the segmental level. The subsegmental arterial bed largely is unopacified and not evaluated due to bolus timing. 2. Trace pleural effusions.  The lungs are otherwise clear. 3. Mild cardiomegaly. 4. 1.5 cm hypodense nodule left thyroid  lobe. Nonemergent follow-up ultrasound recommended. 5. Patulous esophagus with small hiatal hernia. No impacted food bolus is seen. 6. Constipation. 7. Bilateral nonobstructive nephrolithiasis. 8. 3.0 cm simple appearing right ovarian cyst. No follow-up imaging is recommended unless there are localizing symptoms. If there are localizing symptoms, obtain ultrasound to exclude torsion. 9. 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 10. Minimal aortic atherosclerosis.   ASSESSMENT/PLAN  Ms. Pica is a 48 y.o. female.  The patient is seen today for evaluation and management of:  1. Ineffective esophageal motility   2. Gastroesophageal reflux disease with esophagitis without hemorrhage   3. Bilious vomiting with nausea   4. Colon cancer screening    The patient is clinically and hemodynamically stable at this time.   Recurrent Nausea/Vomiting with known HH Multiple evaluations over the years.  IEM felt to be playing some role in her symptoms.  Seen by 2 separate [facility] and not felt to be candidate for [facility] as it is not clear it would help her symptoms.  I am hopeful for her that TCA treatment will be effective long-term.  She has been recommended to stop PCAB, but I feel that we should continue this for NERD at this time that was not as well treated with PPI therapy.  Will hold on repeat dilations of esophagus for now.  I hope she does well long-term but will have plan as noted below if symptoms recur/worsen. - Continue Voquenza daily. - Continue amitriptyline  20 mg at bedtime. - Ensure a year supply of medications is available. - Monitor symptoms and contact clinic if symptoms worsen. - Consider increasing amitriptyline  to 25 mg if symptoms increase,  Colon cancer screening - Due in 2033   No orders of the defined types were placed in this encounter.    New Prescriptions   No medications on file   Modified Medications   Modified Medication Previous Medication   AMITRIPTYLINE  (ELAVIL ) 10 MG TABLET amitriptyline  (ELAVIL ) 10 MG tablet      Take 2 tablets (20 mg total) by mouth at bedtime.    Take 2 tablets (20 mg total) by mouth at bedtime.   VONOPRAZAN FUMARATE  (VOQUEZNA ) 20 MG TABS Vonoprazan Fumarate  (VOQUEZNA ) 20 MG TABS      Take 20 mg by mouth daily.    Take 20 mg by mouth daily.    Planned Follow Up No follow-ups on file.   Total Time in Face-to-Face and in  Coordination of Care for patient including independent/personal interpretation/review of prior testing, medical history, examination, medication adjustment, communicating results with the patient directly, and documentation with the EHR is 25 minutes.   Aloha Finner, MD [facility]oscopy Office # [phone]

## 2024-06-20 NOTE — Patient Instructions (Addendum)
 We have sent the following medications to your pharmacy for you to pick up at your convenience: Voquezna , Amitriptyline    Follow-up in 3 months with Alan Coombs, PA-C on : 08/09/24 at 10:40 am    _______________________________________________________  If your blood pressure at your visit was 140/90 or greater, please contact your primary care physician to follow up on this.  _______________________________________________________  If you are age 47 or older, your body mass index should be between 23-30. Your Body mass index is 38.78 kg/m. If this is out of the aforementioned range listed, please consider follow up with your Primary Care Provider.  If you are age 79 or younger, your body mass index should be between 19-25. Your Body mass index is 38.78 kg/m. If this is out of the aformentioned range listed, please consider follow up with your Primary Care Provider.   ________________________________________________________  The [facility] providers would like to encourage you to use MYCHART to communicate with providers for non-urgent requests or questions.  Due to long hold times on the telephone, sending your provider a message by [facility] may be a faster and more efficient way to get a response.  Please allow 48 business hours for a response.  Please remember that this is for non-urgent requests.  _______________________________________________________  [facility] is using a team-based approach to care.  Your team is made up of your doctor and two to three APPS. Our APPS (Nurse Practitioners and Physician Assistants) work with your physician to ensure care continuity for you. They are fully qualified to address your health concerns and develop a treatment plan. They communicate directly with your gastroenterologist to care for you. Seeing the Advanced Practice Practitioners on your physician's team can help you by facilitating care more promptly, often allowing for earlier  appointments, access to diagnostic testing, procedures, and other specialty referrals.   Due to recent changes in healthcare laws, you may see the results of your imaging and laboratory studies on MyChart before your provider has had a chance to review them.  We understand that in some cases there may be results that are confusing or concerning to you. Not all laboratory results come back in the same time frame and the provider may be waiting for multiple results in order to interpret others.  Please give us  48 hours in order for your provider to thoroughly review all the results before contacting the office for clarification of your results.   Thank you for choosing me and [facility].  Dr. Wilhelmenia

## 2024-06-22 NOTE — Patient Instructions (Signed)
 Renea LITTIE Slack - I have attempted to call you three times but have been unsuccessful in reaching you. I work with Cleotilde Perkins, DO and am calling to support your healthcare needs. If I can be of assistance to you, please contact me at [phone].     Thank you,  Rosaline Finlay, RN MSN [place]  [facility] RN Care Manager Direct Dial: [phone]  Fax: [phone]

## 2024-06-22 NOTE — Patient Outreach (Signed)
 Care Coordination   06/22/2024 Name: SIGNORA ZUCCO MRN: [id number] DOB: 09-24-1975   Care Coordination Outreach Attempts:  A third unsuccessful outreach was attempted today to complete CCM follow-up visit.  Follow Up Plan:  No further outreach attempts will be made at this time. We have been unable to contact the patient to complete follow-up visit.  Encounter Outcome:  No Answer.HIPAA compliant voicemail left requesting return call.   Rosaline Finlay, RN MSN [place]  [facility] RN Care Manager Direct Dial: [phone]  Fax: [phone]

## 2024-06-23 ENCOUNTER — Encounter: Payer: Self-pay | Admitting: Gastroenterology

## 2024-06-23 DIAGNOSIS — Z1211 Encounter for screening for malignant neoplasm of colon: Secondary | ICD-10-CM | POA: Insufficient documentation

## 2024-07-05 ENCOUNTER — Ambulatory Visit (HOSPITAL_BASED_OUTPATIENT_CLINIC_OR_DEPARTMENT_OTHER)

## 2024-07-05 ENCOUNTER — Encounter (HOSPITAL_BASED_OUTPATIENT_CLINIC_OR_DEPARTMENT_OTHER): Payer: Self-pay | Admitting: Obstetrics & Gynecology

## 2024-07-05 ENCOUNTER — Ambulatory Visit (INDEPENDENT_AMBULATORY_CARE_PROVIDER_SITE_OTHER): Admitting: Obstetrics & Gynecology

## 2024-07-05 ENCOUNTER — Other Ambulatory Visit (HOSPITAL_BASED_OUTPATIENT_CLINIC_OR_DEPARTMENT_OTHER): Payer: Self-pay | Admitting: Obstetrics & Gynecology

## 2024-07-05 VITALS — BP 122/80 | HR 74

## 2024-07-05 DIAGNOSIS — N926 Irregular menstruation, unspecified: Secondary | ICD-10-CM

## 2024-07-05 DIAGNOSIS — N840 Polyp of corpus uteri: Secondary | ICD-10-CM

## 2024-07-05 DIAGNOSIS — Z1231 Encounter for screening mammogram for malignant neoplasm of breast: Secondary | ICD-10-CM

## 2024-07-05 DIAGNOSIS — Z975 Presence of (intrauterine) contraceptive device: Secondary | ICD-10-CM | POA: Diagnosis not present

## 2024-07-05 NOTE — Progress Notes (Signed)
 Ultrasound f/u Patient name: Tamara Church MRN [id number]  Date of birth: 20-Oct-1975 Chief Complaint:   Follow-up (From Sonohystogram)  History of Present Illness:   Tamara Church is a 48 y.o. [phone] African-American female being seen today for discussion of ultrasound findings.  Referred for [facility] due to endometrial polyp being noted on CT done in June when seen in ER due to nausea, vomiting and abdominal pain.  Ovarian cyst measuring 3.1cm on right ovary noted.  Also noted was a possible endometrial polyp.  SH[facility] recommended for follow up.    She does have a Mirena  IUD in place which is used for contraception.  Does not have any vaginal bleeding with this.  Does not have menstrual cycles.  Denies any abnormal bleeding or spotting.  IUD was placed 07/2019.  Notes reviewed in EPIC until it was found.  Placed by Marien Piety, Family Medicine, 07/24/2019.    Ultrasound today reviewed.  IUD is in correct location.  Ovarian cyst has resolved.  With [facility], single 1.6cm endometrial polyp present.  Discussed finding.  She is asymptomatic from this and this is an incidental finding on the CT scan.  Removal in pre-menopausal woman without symptoms is not clear.  Reviewed options for removal with hysteroscopy.  Would possibly need to remove IUD and replace at the same time.  Has been present for not quite 5 years.  Also, discussed following with ultrasound not clearly recommended either but a possibility.  Also, could just follow conservatively and if has irregular bleeding, plan to remove then.  Low risk of precancerous or cancerous cells in this finding reviewed as well.  She is not really interested in any procedure at this time but would like to follow with ultrasound.  Will plan to repeat in 6 months.    Last pap 04/27/2023. Results were: neg with neg HR HPV.  Findings c/w BV.   Review of Systems:   Pertinent items are noted in HPI Denies any urinary or bowel changes or pelvic pain.  Denies any  vaginal bleeding.   Pertinent History Reviewed:  Reviewed past medical,surgical, social and family history.  Reviewed problem list, medications and allergies. Physical Assessment:   Vitals:   07/05/24 1334  BP: 122/80  Pulse: 74  SpO2: 99%  There is no height or weight on file to calculate BMI.        Physical Examination:   General appearance - well appearing, and in no distress  Mental status - alert, oriented to person, place, and time  Psych:  She has a normal mood and affect   Assessment & Plan:  1. Endometrial polyp (Primary) - discussed findings and options as per above.  Repeat ultrasound in 6 months.  Order placed and appt scheduled. - US  PELVIS TRANSVAGINAL NON-OB (TV ONLY); Future  2. Encounter for screening mammogram for malignant neoplasm of breast - MMG is due so order placed - MM 3D SCREENING MAMMOGRAM BILATERAL BREAST; Future  3. IUD (intrauterine device) in place - placement 07/2019.  Contraceptive use for 8 years.  Will need removed/replaced in about 3 years if not sooner.     Orders Placed This Encounter  Procedures   US  PELVIS TRANSVAGINAL NON-OB (TV ONLY)   MM 3D SCREENING MAMMOGRAM BILATERAL BREAST    Meds: No orders of the defined types were placed in this encounter.   Follow-up: Return in about 6 months (around 01/03/2025).  Time with pt 21 minutes.  Documentation time 6 additional minutes.  Total  time with pt and documentation:  27 minutes.    Ronal GORMAN Pinal, MD 07/10/2024 6:44 AM GYNECOLOGY  VISIT

## 2024-07-06 ENCOUNTER — Other Ambulatory Visit: Payer: Self-pay | Admitting: Gastroenterology

## 2024-07-06 DIAGNOSIS — R1013 Epigastric pain: Secondary | ICD-10-CM

## 2024-07-06 DIAGNOSIS — K2100 Gastro-esophageal reflux disease with esophagitis, without bleeding: Secondary | ICD-10-CM

## 2024-07-06 DIAGNOSIS — K2970 Gastritis, unspecified, without bleeding: Secondary | ICD-10-CM

## 2024-07-06 DIAGNOSIS — R110 Nausea: Secondary | ICD-10-CM

## 2024-07-06 DIAGNOSIS — E876 Hypokalemia: Secondary | ICD-10-CM

## 2024-07-31 ENCOUNTER — Other Ambulatory Visit: Payer: Self-pay | Admitting: Student

## 2024-08-03 ENCOUNTER — Other Ambulatory Visit: Payer: Self-pay | Admitting: Gastroenterology

## 2024-08-03 DIAGNOSIS — K2970 Gastritis, unspecified, without bleeding: Secondary | ICD-10-CM

## 2024-08-03 DIAGNOSIS — K2100 Gastro-esophageal reflux disease with esophagitis, without bleeding: Secondary | ICD-10-CM

## 2024-08-03 DIAGNOSIS — E876 Hypokalemia: Secondary | ICD-10-CM

## 2024-08-03 DIAGNOSIS — R1013 Epigastric pain: Secondary | ICD-10-CM

## 2024-08-03 DIAGNOSIS — R110 Nausea: Secondary | ICD-10-CM

## 2024-08-08 NOTE — Progress Notes (Unsigned)
 08/09/2024 Tamara Church Slack [id number] 11-22-1975  Referring provider: Cleotilde Perkins, DO Primary GI doctor: Dr. Wilhelmenia  ASSESSMENT AND PLAN:  GERD with small hiatal hernia with chronic AB pain/nausea/vomiting, esophageal spasm Status post cholecystectomy 09/2022 07/2022 GES normal at 98% at 3 hours 10/2022 EGD 2 cm HH, gastritis Bravo test off PPI normal exposure Esophageal manometry ineffective motility, normal EG junction relaxation  Failed: pantoprazole  40 mg, Dexilant , Reglan , Zofran  Phenergan  helps some, prefers suppository One episode of nausea/vomiting -On Voquezna  10 mg daily, doing very well on this medication -amitriptyline  20 mg at night for esophageal hypersensitivity and anxiety. - Suggested peppermint Altoids as a natural antispasmodic during episodes. - will do FDGard samples, given information about burping/beltching.  -If any continuing issues could consider increase in amitriptyline  versus baclofen trial  - no risk factors for SIBO, could consider testing if symptoms worse  Abdominal pain 02/10/2024 CT abdomen pelvis with contrast for nausea diarrhea rectosigmoid colon underdistention or colitis small hiatal hernia 01/04/2024 abdominal pain unremarkable bowels urinary bladder wall thickening 12/07/2023 CT abdomen pelvis with contrast for abdominal pain unremarkable No diarrhea, no constipation at this time, no melena, no hematochezia Normal colonoscopy 2024 AB pain is after episodes of esophageal dysphagia, suspect spasm/functional  Screening colonoscopy  2024 normal other than hemorrhoids, recall 10 years  Patient Care Team: Tamara Perkins, DO as PCP - General (Family Medicine) Tamara Church, CNM as Midwife (Obstetrics and Gynecology)  HISTORY OF PRESENT ILLNESS: 48 y.o. female with a past medical history of hypertension, anemia, anxiety cholelithiasis status post CCK, hiatal hernia, GERD and others listed below presents for evaluation of GERD.    I last saw the patient in the office 03/07/2024 for GERD and dysphagia with significant workup did trial of hyoscyamine /amitriptyline /voquezna  nausea patient had follow-up with [facility], deemed not a candidate for [facility] repair fundoplication.  Saw Dr. Wilhelmenia 06/20/2024 continue on medications.  Discussed the use of AI scribe software for clinical note transcription with the patient, who gave verbal consent to proceed.  History of Present Illness   Tamara Church is a 48 year old female with gastroesophageal reflux disease who presents with recent episodes of nausea and belching.  She reports that her symptoms have improved since her last visit in June. However, she experienced a recent episode of nausea and belching after consuming lasagna from a microwaveable tray, which she attributes to the runny tomato sauce. This episode occurred on a Saturday, and she used a prescribed suppository for nausea and vomiting, which eventually alleviated her symptoms.  She has been taking Boquesta 10 mg daily and amitriptyline  20 mg at night, which have been effective in managing her symptoms. Since starting these medications, she has not required any hospital visits and has not experienced significant regurgitation or abdominal pain, which were previously problematic.  Since the recent episode, she has experienced increased belching after eating, but no dark or bloody stools, and her bowel movements are regular. She also experienced mild nausea on the Sunday following the lasagna incident, which she managed by resting and drinking water.  Her family, including her husband and daughter, noted that the lasagna she consumed was different from what she had previously eaten at her uncle's house, as it was more runny and packaged, which they believe contributed to her symptoms.     She  reports that she has never smoked. She has never used smokeless tobacco. She reports that she does not drink alcohol and does not use  drugs.  RELEVANT GI HISTORY, IMAGING AND LABS:    2/25 EGD Bravo - Normal esophagus. - 2 cm hiatal hernia. - One gastric polyp. Resected and retrieved. - Normal examined duodenum. - The BRAVO pH capsule was deployed.   Esophageal Manometry  Bravo test (done OFF of PPI therapy), which showed normal total acid exposure of 0.1% and normal DeMeester score of 1.5.  Esophageal exposure to acid reflux is also normal in the upright and supine positions.  There was negative symptom correlation for heartburn, dysphagia, based upon the patient's symptom association probability.  Thus patient's Bravo study was negative for evidence of significant reflux.  Would consider functional heartburn as etiology of the patient's symptoms.    2024 Colonoscopy - Hemorrhoids found on digital rectal exam. - The examined portion of the ileum was normal. - Normal mucosa in the entire examined colon. - Non-bleeding non-thrombosed internal hemorrhoids   2024 EGD - No gross lesions in the entire esophagus. Biopsied. - Z-line irregular, 39 cm from the incisors. - 2 cm hiatal hernia. - Erythematous mucosa in the stomach. Biopsied. - No gross lesions in the duodenal bulb, in the first portion of the duodenum and in the second portion of the duodenum. CBC    Component Value Date/Time   WBC 10.9 (H) 03/22/2024 2031   RBC 3.99 03/22/2024 2031   HGB 11.8 (L) 03/22/2024 2031   HCT 36.2 03/22/2024 2031   PLT 285 03/22/2024 2031   MCV 90.7 03/22/2024 2031   MCH 29.6 03/22/2024 2031   MCHC 32.6 03/22/2024 2031   RDW 13.3 03/22/2024 2031   LYMPHSABS 1.2 03/14/2024 2306   MONOABS 0.3 03/14/2024 2306   EOSABS 0.0 03/14/2024 2306   BASOSABS 0.0 03/14/2024 2306   Recent Labs    01/04/24 0838 01/05/24 1938 01/26/24 1535 02/10/24 1002 02/11/24 0841 02/16/24 2024 02/29/24 2101 03/14/24 2306 03/17/24 1946 03/22/24 2031  HGB 12.1 12.2 11.7* 12.9 13.3 11.8* 11.9* 12.2 12.5 11.8*    CMP     Component Value Date/Time    NA 135 03/22/2024 2031   NA 140 08/02/2023 1459   K 4.1 03/22/2024 2031   CL 98 03/22/2024 2031   CO2 23 03/22/2024 2031   GLUCOSE 117 (H) 03/22/2024 2031   BUN 18 03/22/2024 2031   BUN 12 08/02/2023 1459   CREATININE 0.99 03/22/2024 2031   CREATININE 1.28 (H) 03/18/2012 1013   CALCIUM 9.6 03/22/2024 2031   PROT 9.0 (H) 03/22/2024 2031   PROT 7.4 06/04/2023 1643   ALBUMIN 4.2 03/22/2024 2031   ALBUMIN 4.3 06/04/2023 1643   AST 19 03/22/2024 2031   ALT 22 03/22/2024 2031   ALKPHOS 58 03/22/2024 2031   BILITOT 0.3 03/22/2024 2031   BILITOT 0.3 06/04/2023 1643   GFRNONAA >60 03/22/2024 2031   GFRAA >60 05/26/2020 0736      Latest Ref Rng & Units 03/22/2024    8:31 PM 03/14/2024   11:45 PM 02/29/2024    9:01 PM  Hepatic Function  Total Protein 6.5 - 8.1 g/dL 9.0  8.3  8.4   Albumin 3.5 - 5.0 g/dL 4.2  4.3  4.4   AST 15 - 41 U/L 19  29  16    ALT 0 - 44 U/L 22  25  13    Alk Phosphatase 38 - 126 U/L 58  64  56   Total Bilirubin 0.0 - 1.2 mg/dL 0.3  0.2  0.5       Current Medications:   Current Outpatient Medications (Endocrine &  Metabolic):    levonorgestrel  (MIRENA ) 20 MCG/DAY IUD, 1 each by Intrauterine route once.  Current Outpatient Medications (Cardiovascular):    amLODipine  (NORVASC ) 10 MG tablet, Take 1 tablet by mouth once daily   spironolactone  (ALDACTONE ) 25 MG tablet, Take 2 tablets (50 mg total) by mouth at bedtime.    Current Outpatient Medications (Hematological):    folic acid  (FOLVITE ) 1 MG tablet, Take 1 tablet by mouth once daily  Current Outpatient Medications (Other):    amitriptyline  (ELAVIL ) 10 MG tablet, Take 2 tablets (20 mg total) by mouth at bedtime.   Vonoprazan Fumarate  (VOQUEZNA ) 20 MG TABS, Take 20 mg by mouth daily.  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 Family History:  Her family history includes Hypertension in her mother.  REVIEW OF SYSTEMS  : All other systems reviewed and negative except where noted in the History of Present Illness.  PHYSICAL EXAM: BP 136/80   Pulse 75   Ht 5' 1 (1.549 m)   Wt 213 lb 6 oz (96.8 kg)   BMI 40.32 kg/m  Physical Exam   GENERAL APPEARANCE: Well nourished, in no apparent distress HEENT: No cervical lymphadenopathy, unremarkable thyroid , sclerae anicteric, conjunctiva pink RESPIRATORY: Respiratory effort normal, BS equal bilateral without rales, rhonchi, wheezing CARDIO: RRR with no MRGs, peripheral pulses intact ABDOMEN: Soft, non distended, active bowel sounds in all 4 quadrants, no tenderness to palpation, no rebound, no mass appreciated RECTAL: declines MUSCULOSKELETAL: Full ROM, normal gait, without edema SKIN: Dry, intact without rashes or lesions. No jaundice. NEURO: Alert, oriented, no focal deficits PSYCH: Cooperative, normal mood and affect.      Alan JONELLE Coombs, PA-C 11:32 AM

## 2024-08-09 ENCOUNTER — Ambulatory Visit (INDEPENDENT_AMBULATORY_CARE_PROVIDER_SITE_OTHER): Admitting: Physician Assistant

## 2024-08-09 ENCOUNTER — Encounter: Payer: Self-pay | Admitting: Physician Assistant

## 2024-08-09 VITALS — BP 136/80 | HR 75 | Ht 61.0 in | Wt 213.4 lb

## 2024-08-09 DIAGNOSIS — K224 Dyskinesia of esophagus: Secondary | ICD-10-CM

## 2024-08-09 DIAGNOSIS — K2990 Gastroduodenitis, unspecified, without bleeding: Secondary | ICD-10-CM | POA: Diagnosis not present

## 2024-08-09 DIAGNOSIS — R1013 Epigastric pain: Secondary | ICD-10-CM

## 2024-08-09 DIAGNOSIS — K2100 Gastro-esophageal reflux disease with esophagitis, without bleeding: Secondary | ICD-10-CM

## 2024-08-09 DIAGNOSIS — Z1211 Encounter for screening for malignant neoplasm of colon: Secondary | ICD-10-CM | POA: Diagnosis not present

## 2024-08-09 DIAGNOSIS — K2970 Gastritis, unspecified, without bleeding: Secondary | ICD-10-CM | POA: Diagnosis not present

## 2024-08-09 DIAGNOSIS — R1319 Other dysphagia: Secondary | ICD-10-CM

## 2024-08-09 DIAGNOSIS — R110 Nausea: Secondary | ICD-10-CM

## 2024-08-09 NOTE — Patient Instructions (Addendum)
 Do trial of FDGard over the counter   Remember belching is caused by excessive air swallowing.   Please stop: Eating or drinking too fast  Poorly fitting dentures; not chewing food completely  Carbonated beverages  Chewing gum or sucking on hard candies  Excessive swallowing due to nervous tension or postnasal drip  Forced belching to relieve abdominal discomfort To prevent excessive belching, avoid:  Carbonated beverages  Chewing gum  Hard candies    Can try Florastor probiotic twice a day  Avoid spicy and acidic foods Avoid fatty foods Limit your intake of coffee, tea, alcohol, and carbonated drinks Work to maintain a healthy weight Keep the head of the bed elevated at least 3 inches with blocks or a wedge pillow if you are having any nighttime symptoms Stay upright for 2 hours after eating Avoid meals and snacks three to four hours before bedtime

## 2024-08-09 NOTE — Progress Notes (Signed)
 Attending Physician's Attestation   I have reviewed the chart.   I agree with the Advanced Practitioner's note, impression, and recommendations with any updates as below.    [name], MD [facility] Advanced Endoscopy Office # [phone]

## 2024-08-31 ENCOUNTER — Other Ambulatory Visit: Payer: Self-pay | Admitting: Gastroenterology

## 2024-08-31 DIAGNOSIS — K2100 Gastro-esophageal reflux disease with esophagitis, without bleeding: Secondary | ICD-10-CM

## 2024-08-31 DIAGNOSIS — R110 Nausea: Secondary | ICD-10-CM

## 2024-08-31 DIAGNOSIS — R1013 Epigastric pain: Secondary | ICD-10-CM

## 2024-08-31 DIAGNOSIS — E876 Hypokalemia: Secondary | ICD-10-CM

## 2024-08-31 DIAGNOSIS — K2970 Gastritis, unspecified, without bleeding: Secondary | ICD-10-CM

## 2024-09-25 ENCOUNTER — Other Ambulatory Visit: Payer: Self-pay | Admitting: Student

## 2024-09-25 DIAGNOSIS — I10 Essential (primary) hypertension: Secondary | ICD-10-CM

## 2024-09-26 ENCOUNTER — Other Ambulatory Visit: Payer: Self-pay | Admitting: Student

## 2024-09-26 DIAGNOSIS — I10 Essential (primary) hypertension: Secondary | ICD-10-CM

## 2024-10-13 ENCOUNTER — Telehealth: Payer: Self-pay | Admitting: Gastroenterology

## 2024-10-13 MED ORDER — VOQUEZNA 20 MG PO TABS: 20.0000 mg | ORAL_TABLET | Freq: Every day | ORAL | 3 refills | Status: AC

## 2024-10-13 NOTE — Telephone Encounter (Signed)
 Patient called requesting a refill for Voquenza before the weekend. Please advise, thank you.

## 2024-10-13 NOTE — Telephone Encounter (Signed)
 Refill for Voquenza sent to pharmacy.

## 2024-10-20 ENCOUNTER — Ambulatory Visit (HOSPITAL_COMMUNITY): Admission: EM | Admit: 2024-10-20 | Discharge: 2024-10-20 | Disposition: A | Discharge status: Home / Self Care

## 2024-10-20 ENCOUNTER — Ambulatory Visit: Admitting: Family Medicine

## 2024-10-20 VITALS — BP 127/90 | HR 100 | Temp 98.2°F | Wt 228.2 lb

## 2024-10-20 DIAGNOSIS — J111 Influenza due to unidentified influenza virus with other respiratory manifestations: Secondary | ICD-10-CM | POA: Diagnosis present

## 2024-10-20 MED ORDER — OSELTAMIVIR PHOSPHATE 75 MG PO CAPS: 75.0000 mg | ORAL_CAPSULE | Freq: Two times a day (BID) | ORAL | 0 refills | Status: AC

## 2024-10-20 NOTE — ED Notes (Signed)
 Patient called x1 for triage. No answer

## 2024-10-20 NOTE — ED Notes (Signed)
 Call patient x 2 from waiting area. No response.

## 2024-10-20 NOTE — Patient Instructions (Addendum)
 You have a viral upper respiratory tract infection. The symptoms of a viral infection usually peak on day 4 to 5 of illness and then gradually improve over 10-14 days. It can take 2-3 weeks for cough to completely go away.  You should take Tamiflu  twice daily for 5 days.  Hydration Instructions It is important to stay well hydrated during this illness. Frequent small amounts of fluid will be easier to tolerate then large amounts of fluid at one time.  Things you can do at home to feel better:  - Taking a warm bath, steaming up the bathroom, or using a cool mist humidifier can help with breathing - Vick's Vaporub or equivalent: rub on chest and small amount under nose at night to open nose airways  - Fever helps your body fight infection!  You do not have to treat every fever. If your child seems uncomfortable with fever (temperature 100.4 or higher), you can give Tylenol  up to every 4-6 hours or Ibuprofen  up to every 6-8 hours (if your child is older than 6 months). Please see the chart for the correct dose based on your child's weight  Sore Throat and Cough Treatment  - Honey is good for sore throat - Chamomile tea has antiviral properties - For sore throat you can use throat lozenges, chamomile tea, honey, salt water gargling, warm drinks/broths or popsicles (which ever soothes your child's pain) - Zarabee's cough syrup and mucus is safe to use  Except for medications for fever and pain we do NOT recommend over the counter medications (cough suppressants, cough decongestions, cough expectorants)  for the common cold in children less than 32 years old. Studies have shown that these over the counter medications do not work any better than no medications in children, but may have serious side effects. Over the counter medications can be associated with overdose as some of these medications also contain acetaminophen  (Tylenlol). Additionally some of these medications contain codeine  and hydrocodone   which can cause breathing difficulty in children.    Nasal Congestion Treatment If your infant has nasal congestion, you can try saline nose drops to thin the mucus, keep mucus loose, and open nasal passagesfollowed by bulb suction to temporarily remove nasal secretions. You can buy saline drops at the grocery store or pharmacy. Some common brand names are L'il Noses, Trezevant, and Blasdell.  They are all equal.  Most come in either spray or dropper form.  You can make saline drops at home by adding 1/2 teaspoon (2 mL) of table salt to 1 cup (8 ounces or 240 ml) of warm water.    Please return if you have: - Fever (temperature 100.4 or higher) for 3 days in a row - Difficulty breathing (fast breathing or breathing deep and hard) - Difficulty swallowing - Poor feeding (less than half of normal) - Poor urination (peeing less than 3 times in a day) - Having behavior changes, including irritability or lethargy (decreased responsiveness) - Persistent vomiting - Blood in vomit or stool - Blistering rash

## 2024-10-20 NOTE — ED Notes (Signed)
 Called for triage for the second time. No response. Patient no visualized in lobby.

## 2024-10-20 NOTE — Progress Notes (Signed)
" ° °  SUBJECTIVE:   CHIEF COMPLAINT / HPI:  Tamara Church is a 49 y.o. female with a pertinent past medical history of hypothyroidis presenting to the clinic for flu-like symptoms.  Patient complains of chills, headache intermittently, myalgias, nasal congestion, nonproductive cough, sore throat, and sweats. Tmax 99 F Symptoms began 2 days ago. Sick exposure from granddaughter, who was sick this past weekend after visiting family.  Tested positive for influenza at the ED. The cough is non-productive, without wheezing, dyspnea or hemoptysis and is aggravated by cold air. Associated symptoms include:chills, fever, postnasal drip, and sputum production. Patient does not have new pets. Patient does not have a history of asthma. Patient does not have a history of environmental allergens. Patient does not have recent travel, including to [place] . Patient does not have a history of smoking. Patient  does not have previous Chest X-ray. Patient does not have had a PPD done.  PERTINENT PMH / PSH: GERD, hypothyroidism  *Remainder reviewed in problem list.   OBJECTIVE:   BP (!) 127/90   Pulse 100   Temp 98.2 F (36.8 C)   Wt 228 lb 3.2 oz (103.5 kg)   SpO2 100%   BMI 43.12 kg/m   General: Age-appropriate, resting comfortably in chair, NAD, alert and at baseline. HEENT:  Head: Normocephalic, atraumatic. No tenderness to percussion over sinuses. Eyes: PERRLA. Mild conjunctival erythema, no injections. Nose: Boggy swollen turbinates, notable rhinorrhea. Mouth/Oral: Clear, no tonsillar exudate. Postnasal drainage. MMM. Neck: Supple. No LAD. Cardiovascular: Borderline tachycardic rate and regular rhythm. Normal S1/S2. No murmurs, rubs, or gallops appreciated. 2+ radial pulses. Pulmonary: Clear bilaterally to ascultation. No wheezes, crackles, or rhonchi. Normal WOB on room air. No accessory muscle use. Abdominal: No tenderness to deep or light palpation. No rebound or  guarding. Skin: Warm and dry. Extremities: No peripheral edema bilaterally. Capillary refill 2-3 seconds.   ASSESSMENT/PLAN:   Assessment & Plan Influenza Empiric diagnosis of influenza given patient's recent exposure to granddaughter who tested positive for influenza in the ED.  Afebrile and unremarkable lung exam, no concern for pneumonia.  Mildly dehydrated on exam, but no severe dehydration or indication for IVF.  No major risk factors for complications.  Patient requests Tamiflu  and is within 48 hours of onset of symptoms. - Tamiflu  75 mg twice daily x 5 days - Emphasized importance of hydration, discussed water and Gatorade - Supportive care instructions reviewed per AVS (humidifying air, honey, etc.) - Return if symptoms not improving or worsening, dyspnea, etc. per AVS  Charnette Younkin Toma, MD [facility] "

## 2024-10-24 ENCOUNTER — Telehealth: Payer: Self-pay

## 2024-10-24 DIAGNOSIS — B379 Candidiasis, unspecified: Secondary | ICD-10-CM

## 2024-10-24 NOTE — Telephone Encounter (Signed)
 Patient calls nurse line reporting a yeast infection.   She reports she was prescribed Penicillin by her dentist on 1/22. She reports she will be finished with the course in the next day or so.  She reports she has been experiencing vaginal irritation and increased itching.   She denies any discharge at this time and denies any abnormal odors.   Given recent antibiotic use will forward to PCP for advisement.

## 2024-10-25 MED ORDER — FLUCONAZOLE 150 MG PO TABS: 150.0000 mg | ORAL_TABLET | Freq: Once | ORAL | 0 refills | Status: AC

## 2024-10-25 NOTE — Telephone Encounter (Signed)
 Patient has been updated.   She was appreciative.

## 2024-10-25 NOTE — Telephone Encounter (Signed)
 Sent in prophylactic fluconazole  to pharmacy

## 2025-01-03 ENCOUNTER — Other Ambulatory Visit (HOSPITAL_BASED_OUTPATIENT_CLINIC_OR_DEPARTMENT_OTHER)

## 2025-01-03 ENCOUNTER — Other Ambulatory Visit

## 2025-01-03 ENCOUNTER — Other Ambulatory Visit (HOSPITAL_BASED_OUTPATIENT_CLINIC_OR_DEPARTMENT_OTHER): Admitting: Obstetrics & Gynecology
